# Patient Record
Sex: Female | Born: 1980
Health system: Southern US, Community
[De-identification: ages and names within clinical notes are randomized; demographics above are authoritative.]

## PROBLEM LIST (undated history)

## (undated) DIAGNOSIS — E785 Hyperlipidemia, unspecified: Secondary | ICD-10-CM

## (undated) DIAGNOSIS — K219 Gastro-esophageal reflux disease without esophagitis: Secondary | ICD-10-CM

## (undated) DIAGNOSIS — Z9889 Other specified postprocedural states: Secondary | ICD-10-CM

## (undated) DIAGNOSIS — K589 Irritable bowel syndrome without diarrhea: Secondary | ICD-10-CM

## (undated) DIAGNOSIS — M549 Dorsalgia, unspecified: Secondary | ICD-10-CM

## (undated) DIAGNOSIS — E669 Obesity, unspecified: Secondary | ICD-10-CM

## (undated) DIAGNOSIS — N946 Dysmenorrhea, unspecified: Secondary | ICD-10-CM

## (undated) DIAGNOSIS — R001 Bradycardia, unspecified: Secondary | ICD-10-CM

## (undated) DIAGNOSIS — D649 Anemia, unspecified: Secondary | ICD-10-CM

## (undated) DIAGNOSIS — E119 Type 2 diabetes mellitus without complications: Secondary | ICD-10-CM

## (undated) DIAGNOSIS — G4730 Sleep apnea, unspecified: Secondary | ICD-10-CM

## (undated) DIAGNOSIS — N912 Amenorrhea, unspecified: Secondary | ICD-10-CM

## (undated) DIAGNOSIS — O021 Missed abortion: Secondary | ICD-10-CM

## (undated) DIAGNOSIS — R079 Chest pain, unspecified: Secondary | ICD-10-CM

## (undated) DIAGNOSIS — K5900 Constipation, unspecified: Secondary | ICD-10-CM

## (undated) DIAGNOSIS — N939 Abnormal uterine and vaginal bleeding, unspecified: Secondary | ICD-10-CM

## (undated) DIAGNOSIS — R7303 Prediabetes: Secondary | ICD-10-CM

## (undated) DIAGNOSIS — R32 Unspecified urinary incontinence: Secondary | ICD-10-CM

## (undated) DIAGNOSIS — G43009 Migraine without aura, not intractable, without status migrainosus: Secondary | ICD-10-CM

## (undated) DIAGNOSIS — F419 Anxiety disorder, unspecified: Secondary | ICD-10-CM

## (undated) DIAGNOSIS — E88819 Insulin resistance, unspecified: Secondary | ICD-10-CM

## (undated) DIAGNOSIS — K829 Disease of gallbladder, unspecified: Secondary | ICD-10-CM

## (undated) DIAGNOSIS — E282 Polycystic ovarian syndrome: Secondary | ICD-10-CM

## (undated) DIAGNOSIS — E8881 Metabolic syndrome: Secondary | ICD-10-CM

## (undated) DIAGNOSIS — R112 Nausea with vomiting, unspecified: Secondary | ICD-10-CM

## (undated) DIAGNOSIS — I10 Essential (primary) hypertension: Secondary | ICD-10-CM

## (undated) DIAGNOSIS — N809 Endometriosis, unspecified: Secondary | ICD-10-CM

## (undated) DIAGNOSIS — N979 Female infertility, unspecified: Secondary | ICD-10-CM

## (undated) DIAGNOSIS — E559 Vitamin D deficiency, unspecified: Secondary | ICD-10-CM

## (undated) HISTORY — DX: Essential (primary) hypertension: I10

## (undated) HISTORY — DX: Disease of gallbladder, unspecified: K82.9

## (undated) HISTORY — DX: Chest pain, unspecified: R07.9

## (undated) HISTORY — PX: MOUTH SURGERY: SHX715

## (undated) HISTORY — DX: Endometriosis, unspecified: N80.9

## (undated) HISTORY — DX: Amenorrhea, unspecified: N91.2

## (undated) HISTORY — DX: Vitamin D deficiency, unspecified: E55.9

## (undated) HISTORY — DX: Hyperlipidemia, unspecified: E78.5

## (undated) HISTORY — DX: Dorsalgia, unspecified: M54.9

## (undated) HISTORY — DX: Abnormal uterine and vaginal bleeding, unspecified: N93.9

## (undated) HISTORY — DX: Sleep apnea, unspecified: G47.30

## (undated) HISTORY — DX: Constipation, unspecified: K59.00

## (undated) HISTORY — DX: Irritable bowel syndrome, unspecified: K58.9

## (undated) HISTORY — DX: Anemia, unspecified: D64.9

## (undated) HISTORY — DX: Migraine without aura, not intractable, without status migrainosus: G43.009

## (undated) HISTORY — DX: Bradycardia, unspecified: R00.1

## (undated) HISTORY — DX: Female infertility, unspecified: N97.9

## (undated) HISTORY — DX: Type 2 diabetes mellitus without complications: E11.9

## (undated) HISTORY — PX: CHOLECYSTECTOMY: SHX55

## (undated) HISTORY — DX: Dysmenorrhea, unspecified: N94.6

## (undated) HISTORY — DX: Unspecified urinary incontinence: R32

## (undated) HISTORY — PX: DILATION AND CURETTAGE OF UTERUS: SHX78

## (undated) HISTORY — PX: ENDOMETRIAL BIOPSY: SHX622

---
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
## 2000-10-04 ENCOUNTER — Ambulatory Visit (HOSPITAL_COMMUNITY): Admission: RE | Admit: 2000-10-04 | Discharge: 2000-10-04 | Payer: Self-pay | Admitting: *Deleted

## 2001-01-14 ENCOUNTER — Encounter: Payer: Self-pay | Admitting: *Deleted

## 2001-01-14 ENCOUNTER — Inpatient Hospital Stay (HOSPITAL_COMMUNITY): Admission: AD | Admit: 2001-01-14 | Discharge: 2001-01-14 | Payer: Self-pay | Admitting: *Deleted

## 2001-01-30 ENCOUNTER — Encounter (HOSPITAL_COMMUNITY): Admission: RE | Admit: 2001-01-30 | Discharge: 2001-01-30 | Payer: Self-pay | Admitting: Obstetrics & Gynecology

## 2001-02-07 ENCOUNTER — Encounter: Payer: Self-pay | Admitting: *Deleted

## 2001-02-07 ENCOUNTER — Inpatient Hospital Stay (HOSPITAL_COMMUNITY): Admission: AD | Admit: 2001-02-07 | Discharge: 2001-02-10 | Payer: Self-pay | Admitting: Obstetrics & Gynecology

## 2001-02-11 ENCOUNTER — Encounter: Admission: RE | Admit: 2001-02-11 | Discharge: 2001-03-13 | Payer: Self-pay | Admitting: Obstetrics & Gynecology

## 2001-03-16 ENCOUNTER — Encounter: Admission: RE | Admit: 2001-03-16 | Discharge: 2001-04-15 | Payer: Self-pay | Admitting: Obstetrics & Gynecology

## 2001-05-16 ENCOUNTER — Encounter: Admission: RE | Admit: 2001-05-16 | Discharge: 2001-06-15 | Payer: Self-pay | Admitting: Obstetrics & Gynecology

## 2001-06-16 ENCOUNTER — Encounter: Admission: RE | Admit: 2001-06-16 | Discharge: 2001-07-16 | Payer: Self-pay | Admitting: Obstetrics & Gynecology

## 2001-06-24 ENCOUNTER — Encounter: Admission: RE | Admit: 2001-06-24 | Discharge: 2001-06-24 | Payer: Self-pay | Admitting: Family Medicine

## 2001-06-24 ENCOUNTER — Other Ambulatory Visit: Admission: RE | Admit: 2001-06-24 | Discharge: 2001-06-24 | Payer: Self-pay | Admitting: Family Medicine

## 2001-06-24 ENCOUNTER — Encounter (INDEPENDENT_AMBULATORY_CARE_PROVIDER_SITE_OTHER): Payer: Self-pay | Admitting: *Deleted

## 2001-08-14 ENCOUNTER — Encounter: Admission: RE | Admit: 2001-08-14 | Discharge: 2001-09-13 | Payer: Self-pay | Admitting: Obstetrics & Gynecology

## 2001-10-14 ENCOUNTER — Encounter: Admission: RE | Admit: 2001-10-14 | Discharge: 2001-11-13 | Payer: Self-pay | Admitting: Obstetrics & Gynecology

## 2001-10-14 ENCOUNTER — Encounter: Admission: RE | Admit: 2001-10-14 | Discharge: 2001-10-14 | Payer: Self-pay | Admitting: Family Medicine

## 2001-12-14 ENCOUNTER — Encounter: Admission: RE | Admit: 2001-12-14 | Discharge: 2002-01-13 | Payer: Self-pay | Admitting: Obstetrics & Gynecology

## 2002-01-14 ENCOUNTER — Encounter: Admission: RE | Admit: 2002-01-14 | Discharge: 2002-02-13 | Payer: Self-pay | Admitting: Obstetrics & Gynecology

## 2002-05-06 ENCOUNTER — Encounter: Payer: Self-pay | Admitting: Sports Medicine

## 2002-05-06 ENCOUNTER — Encounter: Admission: RE | Admit: 2002-05-06 | Discharge: 2002-05-06 | Payer: Self-pay | Admitting: Sports Medicine

## 2002-07-14 ENCOUNTER — Encounter: Admission: RE | Admit: 2002-07-14 | Discharge: 2002-07-14 | Payer: Self-pay | Admitting: Sports Medicine

## 2002-07-14 ENCOUNTER — Other Ambulatory Visit: Admission: RE | Admit: 2002-07-14 | Discharge: 2002-07-14 | Payer: Self-pay | Admitting: Family Medicine

## 2002-07-20 ENCOUNTER — Inpatient Hospital Stay (HOSPITAL_COMMUNITY): Admission: AD | Admit: 2002-07-20 | Discharge: 2002-07-20 | Payer: Self-pay | Admitting: Obstetrics and Gynecology

## 2002-07-22 ENCOUNTER — Inpatient Hospital Stay (HOSPITAL_COMMUNITY): Admission: AD | Admit: 2002-07-22 | Discharge: 2002-07-22 | Payer: Self-pay | Admitting: Obstetrics and Gynecology

## 2002-07-29 ENCOUNTER — Inpatient Hospital Stay (HOSPITAL_COMMUNITY): Admission: AD | Admit: 2002-07-29 | Discharge: 2002-07-29 | Payer: Self-pay | Admitting: Obstetrics and Gynecology

## 2002-07-30 ENCOUNTER — Encounter: Admission: RE | Admit: 2002-07-30 | Discharge: 2002-07-30 | Payer: Self-pay | Admitting: Family Medicine

## 2002-09-01 ENCOUNTER — Encounter: Admission: RE | Admit: 2002-09-01 | Discharge: 2002-09-01 | Payer: Self-pay | Admitting: Family Medicine

## 2002-09-13 ENCOUNTER — Encounter: Payer: Self-pay | Admitting: Emergency Medicine

## 2002-09-13 ENCOUNTER — Emergency Department (HOSPITAL_COMMUNITY): Admission: EM | Admit: 2002-09-13 | Discharge: 2002-09-13 | Payer: Self-pay | Admitting: Emergency Medicine

## 2002-09-22 ENCOUNTER — Encounter: Admission: RE | Admit: 2002-09-22 | Discharge: 2002-09-22 | Payer: Self-pay | Admitting: Family Medicine

## 2002-11-27 ENCOUNTER — Encounter: Payer: Self-pay | Admitting: *Deleted

## 2002-11-27 ENCOUNTER — Emergency Department (HOSPITAL_COMMUNITY): Admission: EM | Admit: 2002-11-27 | Discharge: 2002-11-27 | Payer: Self-pay

## 2003-01-21 ENCOUNTER — Encounter: Admission: RE | Admit: 2003-01-21 | Discharge: 2003-01-21 | Payer: Self-pay | Admitting: Family Medicine

## 2003-01-28 ENCOUNTER — Encounter: Admission: RE | Admit: 2003-01-28 | Discharge: 2003-01-28 | Payer: Self-pay | Admitting: Family Medicine

## 2003-05-20 ENCOUNTER — Encounter: Admission: RE | Admit: 2003-05-20 | Discharge: 2003-05-20 | Payer: Self-pay | Admitting: Family Medicine

## 2003-05-22 ENCOUNTER — Emergency Department (HOSPITAL_COMMUNITY): Admission: EM | Admit: 2003-05-22 | Discharge: 2003-05-22 | Payer: Self-pay | Admitting: Emergency Medicine

## 2003-05-27 ENCOUNTER — Encounter: Admission: RE | Admit: 2003-05-27 | Discharge: 2003-05-27 | Payer: Self-pay | Admitting: Family Medicine

## 2003-06-15 ENCOUNTER — Encounter: Admission: RE | Admit: 2003-06-15 | Discharge: 2003-06-15 | Payer: Self-pay | Admitting: Family Medicine

## 2003-07-16 ENCOUNTER — Emergency Department (HOSPITAL_COMMUNITY): Admission: EM | Admit: 2003-07-16 | Discharge: 2003-07-16 | Payer: Self-pay | Admitting: Emergency Medicine

## 2003-12-31 ENCOUNTER — Emergency Department (HOSPITAL_COMMUNITY): Admission: EM | Admit: 2003-12-31 | Discharge: 2003-12-31 | Payer: Self-pay | Admitting: Emergency Medicine

## 2004-02-08 ENCOUNTER — Encounter: Payer: Self-pay | Admitting: Family Medicine

## 2004-02-08 ENCOUNTER — Ambulatory Visit: Payer: Self-pay | Admitting: Family Medicine

## 2004-02-08 ENCOUNTER — Other Ambulatory Visit: Admission: RE | Admit: 2004-02-08 | Discharge: 2004-02-08 | Payer: Self-pay | Admitting: Family Medicine

## 2004-02-09 ENCOUNTER — Encounter (INDEPENDENT_AMBULATORY_CARE_PROVIDER_SITE_OTHER): Payer: Self-pay | Admitting: *Deleted

## 2004-02-09 LAB — CONVERTED CEMR LAB

## 2004-02-15 ENCOUNTER — Encounter: Admission: RE | Admit: 2004-02-15 | Discharge: 2004-02-15 | Payer: Self-pay | Admitting: Family Medicine

## 2004-05-11 ENCOUNTER — Ambulatory Visit: Payer: Self-pay | Admitting: Family Medicine

## 2004-10-06 ENCOUNTER — Ambulatory Visit: Payer: Self-pay | Admitting: Family Medicine

## 2004-10-24 ENCOUNTER — Emergency Department (HOSPITAL_COMMUNITY): Admission: EM | Admit: 2004-10-24 | Discharge: 2004-10-25 | Payer: Self-pay | Admitting: Emergency Medicine

## 2005-01-19 ENCOUNTER — Emergency Department (HOSPITAL_COMMUNITY): Admission: EM | Admit: 2005-01-19 | Discharge: 2005-01-19 | Payer: Self-pay | Admitting: Family Medicine

## 2005-02-16 ENCOUNTER — Emergency Department (HOSPITAL_COMMUNITY): Admission: EM | Admit: 2005-02-16 | Discharge: 2005-02-16 | Payer: Self-pay | Admitting: *Deleted

## 2005-09-20 ENCOUNTER — Other Ambulatory Visit: Admission: RE | Admit: 2005-09-20 | Discharge: 2005-09-20 | Payer: Self-pay | Admitting: Obstetrics and Gynecology

## 2005-10-12 ENCOUNTER — Emergency Department (HOSPITAL_COMMUNITY): Admission: EM | Admit: 2005-10-12 | Discharge: 2005-10-12 | Payer: Self-pay | Admitting: Family Medicine

## 2006-03-08 ENCOUNTER — Ambulatory Visit (HOSPITAL_COMMUNITY): Admission: RE | Admit: 2006-03-08 | Discharge: 2006-03-08 | Payer: Self-pay

## 2006-03-12 ENCOUNTER — Emergency Department (HOSPITAL_COMMUNITY): Admission: EM | Admit: 2006-03-12 | Discharge: 2006-03-12 | Payer: Self-pay | Admitting: Family Medicine

## 2006-05-28 ENCOUNTER — Ambulatory Visit: Payer: Self-pay | Admitting: Family Medicine

## 2006-05-28 LAB — CONVERTED CEMR LAB
ALT: 11 units/L (ref 0–35)
AST: 13 units/L (ref 0–37)
Albumin: 4.2 g/dL (ref 3.5–5.2)
Alkaline Phosphatase: 68 units/L (ref 39–117)
BUN: 15 mg/dL (ref 6–23)
CO2: 22 meq/L (ref 19–32)
Calcium: 9 mg/dL (ref 8.4–10.5)
Chloride: 106 meq/L (ref 96–112)
Creatinine, Ser: 0.8 mg/dL (ref 0.40–1.20)
Glucose, Bld: 90 mg/dL (ref 70–99)
Potassium: 4.3 meq/L (ref 3.5–5.3)
Sodium: 140 meq/L (ref 135–145)
Total Bilirubin: 0.2 mg/dL — ABNORMAL LOW (ref 0.3–1.2)
Total Protein: 7.4 g/dL (ref 6.0–8.3)

## 2006-07-04 DIAGNOSIS — E669 Obesity, unspecified: Secondary | ICD-10-CM | POA: Insufficient documentation

## 2006-07-04 DIAGNOSIS — Z87891 Personal history of nicotine dependence: Secondary | ICD-10-CM | POA: Insufficient documentation

## 2006-07-04 DIAGNOSIS — E282 Polycystic ovarian syndrome: Secondary | ICD-10-CM | POA: Insufficient documentation

## 2006-07-05 ENCOUNTER — Encounter (INDEPENDENT_AMBULATORY_CARE_PROVIDER_SITE_OTHER): Payer: Self-pay | Admitting: *Deleted

## 2006-07-19 ENCOUNTER — Encounter (INDEPENDENT_AMBULATORY_CARE_PROVIDER_SITE_OTHER): Payer: Self-pay | Admitting: Specialist

## 2006-07-19 ENCOUNTER — Ambulatory Visit (HOSPITAL_COMMUNITY): Admission: RE | Admit: 2006-07-19 | Discharge: 2006-07-19 | Payer: Self-pay | Admitting: Obstetrics and Gynecology

## 2006-09-18 ENCOUNTER — Emergency Department (HOSPITAL_COMMUNITY): Admission: EM | Admit: 2006-09-18 | Discharge: 2006-09-18 | Payer: Self-pay | Admitting: Emergency Medicine

## 2006-11-19 ENCOUNTER — Telehealth: Payer: Self-pay | Admitting: *Deleted

## 2006-11-20 ENCOUNTER — Ambulatory Visit: Payer: Self-pay | Admitting: Family Medicine

## 2006-11-20 DIAGNOSIS — M545 Low back pain, unspecified: Secondary | ICD-10-CM | POA: Insufficient documentation

## 2006-12-05 ENCOUNTER — Ambulatory Visit: Payer: Self-pay | Admitting: Family Medicine

## 2006-12-17 ENCOUNTER — Ambulatory Visit: Payer: Self-pay | Admitting: Family Medicine

## 2007-01-10 ENCOUNTER — Telehealth (INDEPENDENT_AMBULATORY_CARE_PROVIDER_SITE_OTHER): Payer: Self-pay | Admitting: *Deleted

## 2007-01-14 ENCOUNTER — Encounter: Payer: Self-pay | Admitting: *Deleted

## 2007-09-03 ENCOUNTER — Encounter: Admission: RE | Admit: 2007-09-03 | Discharge: 2007-09-03 | Payer: Self-pay | Admitting: Family Medicine

## 2007-09-05 ENCOUNTER — Ambulatory Visit (HOSPITAL_COMMUNITY): Admission: RE | Admit: 2007-09-05 | Discharge: 2007-09-05 | Payer: Self-pay | Admitting: Unknown Physician Specialty

## 2008-06-29 ENCOUNTER — Encounter: Admission: RE | Admit: 2008-06-29 | Discharge: 2008-06-29 | Payer: Self-pay | Admitting: Anesthesiology

## 2008-07-19 ENCOUNTER — Ambulatory Visit: Payer: Self-pay | Admitting: Family Medicine

## 2008-07-19 ENCOUNTER — Encounter: Payer: Self-pay | Admitting: Family Medicine

## 2008-09-02 ENCOUNTER — Ambulatory Visit (HOSPITAL_COMMUNITY): Admission: RE | Admit: 2008-09-02 | Discharge: 2008-09-03 | Payer: Self-pay | Admitting: General Surgery

## 2008-09-02 ENCOUNTER — Encounter (INDEPENDENT_AMBULATORY_CARE_PROVIDER_SITE_OTHER): Payer: Self-pay | Admitting: General Surgery

## 2008-10-18 ENCOUNTER — Encounter: Admission: RE | Admit: 2008-10-18 | Discharge: 2008-10-18 | Payer: Self-pay | Admitting: General Surgery

## 2008-11-09 ENCOUNTER — Other Ambulatory Visit: Admission: RE | Admit: 2008-11-09 | Discharge: 2008-11-09 | Payer: Self-pay | Admitting: Obstetrics and Gynecology

## 2009-02-02 ENCOUNTER — Encounter (INDEPENDENT_AMBULATORY_CARE_PROVIDER_SITE_OTHER): Payer: Self-pay | Admitting: Obstetrics and Gynecology

## 2009-02-02 ENCOUNTER — Ambulatory Visit (HOSPITAL_COMMUNITY): Admission: RE | Admit: 2009-02-02 | Discharge: 2009-02-02 | Payer: Self-pay | Admitting: Obstetrics and Gynecology

## 2009-05-07 ENCOUNTER — Emergency Department (HOSPITAL_COMMUNITY): Admission: EM | Admit: 2009-05-07 | Discharge: 2009-05-07 | Payer: Self-pay | Admitting: Family Medicine

## 2009-10-20 ENCOUNTER — Emergency Department (HOSPITAL_COMMUNITY): Admission: EM | Admit: 2009-10-20 | Discharge: 2009-10-20 | Payer: Self-pay | Admitting: Family Medicine

## 2010-01-13 ENCOUNTER — Emergency Department (HOSPITAL_COMMUNITY): Admission: EM | Admit: 2010-01-13 | Discharge: 2010-01-13 | Payer: Self-pay | Admitting: Family Medicine

## 2010-07-23 LAB — POCT RAPID STREP A (OFFICE): Streptococcus, Group A Screen (Direct): POSITIVE — AB

## 2010-08-11 LAB — BASIC METABOLIC PANEL
Chloride: 110 mEq/L (ref 96–112)
GFR calc Af Amer: >60 mL/min (ref >60)
GFR calc non Af Amer: >60 mL/min (ref >60)
Potassium: 4.4 mEq/L (ref 3.5–5.1)
Sodium: 140 mEq/L (ref 135–145)

## 2010-08-11 LAB — CBC
HCT: 28.7 % — ABNORMAL LOW (ref 36.0–46.0)
Platelets: 358 10*3/uL (ref 150–400)
WBC: 7 10*3/uL (ref 4.0–10.5)

## 2010-08-11 LAB — URINALYSIS, ROUTINE W REFLEX MICROSCOPIC
Bilirubin Urine: NEGATIVE
Ketones, ur: NEGATIVE mg/dL
Nitrite: NEGATIVE
Specific Gravity, Urine: 1.025 (ref 1.005–1.030)
pH: 6 (ref 5.0–8.0)

## 2010-08-11 LAB — URINE MICROSCOPIC-ADD ON

## 2010-08-11 LAB — TYPE AND SCREEN

## 2010-08-11 LAB — PREGNANCY, URINE: Preg Test, Ur: NEGATIVE

## 2010-08-11 LAB — ABO/RH: ABO/RH(D): A POS

## 2010-08-16 LAB — COMPREHENSIVE METABOLIC PANEL
ALT: 19 U/L (ref 0–35)
Alkaline Phosphatase: 66 U/L (ref 39–117)
BUN: 9 mg/dL (ref 6–23)
Chloride: 104 mEq/L (ref 96–112)
Glucose, Bld: 111 mg/dL — ABNORMAL HIGH (ref 70–99)
Potassium: 4 mEq/L (ref 3.5–5.1)
Sodium: 139 mEq/L (ref 135–145)
Total Bilirubin: 0.5 mg/dL (ref 0.3–1.2)
Total Protein: 7 g/dL (ref 6.0–8.3)

## 2010-08-16 LAB — DIFFERENTIAL
Basophils Absolute: 0.1 10*3/uL (ref 0.0–0.1)
Basophils Relative: 1 % (ref 0–1)
Eosinophils Absolute: 0.1 10*3/uL (ref 0.0–0.7)
Monocytes Absolute: 0.6 10*3/uL (ref 0.1–1.0)
Monocytes Relative: 6 % (ref 3–12)
Neutrophils Relative %: 72 % (ref 43–77)

## 2010-08-16 LAB — GLUCOSE, CAPILLARY
Glucose-Capillary: 104 mg/dL — ABNORMAL HIGH (ref 70–99)
Glucose-Capillary: 115 mg/dL — ABNORMAL HIGH (ref 70–99)
Glucose-Capillary: 137 mg/dL — ABNORMAL HIGH (ref 70–99)

## 2010-08-16 LAB — CBC
HCT: 35.3 % — ABNORMAL LOW (ref 36.0–46.0)
Hemoglobin: 11.7 g/dL — ABNORMAL LOW (ref 12.0–15.0)
RBC: 4.45 MIL/uL (ref 3.87–5.11)
RDW: 14.7 % (ref 11.5–15.5)
WBC: 10.9 10*3/uL — ABNORMAL HIGH (ref 4.0–10.5)

## 2010-08-16 LAB — HEMOGLOBIN A1C: Hgb A1c MFr Bld: 5.8 % (ref 4.6–6.1)

## 2010-09-15 ENCOUNTER — Ambulatory Visit: Payer: Self-pay

## 2010-09-15 ENCOUNTER — Other Ambulatory Visit: Payer: Self-pay | Admitting: Occupational Medicine

## 2010-09-15 DIAGNOSIS — R52 Pain, unspecified: Secondary | ICD-10-CM

## 2010-09-19 NOTE — Op Note (Signed)
[name] [name], CLUTE NO.:  [id number]   MEDICAL RECORD NO.:  [id number]          PATIENT TYPE:  AMB   LOCATION:  DAY                          FACILITY:  [facility]   PHYSICIAN:  Adolph Pollack, M.D.DATE OF BIRTH:  06/23/1980   DATE OF PROCEDURE:  09/02/2008  DATE OF DISCHARGE:                               OPERATIVE REPORT   PREOPERATIVE DIAGNOSIS:  Biliary dyskinesia.   POSTOPERATIVE DIAGNOSIS:  Biliary dyskinesia.   PROCEDURE:  Laparoscopic cholecystectomy with intraoperative  cholangiogram.   SURGEON:  Adolph Pollack, M.D.   ASSISTANT:  Anselm Pancoast. Zachery Dakins, M.D.   ANESTHESIA:  General.   INDICATION:  [name] [name] is a 30 year old female with typical bulk  biliary colic but no gallstones.  However, she has a significantly  decreased gallbladder ejection fraction consistent with biliary  dyskinesia.  Her symptoms are increasing, and now she presents for  cholecystectomy.  We have discussed the procedure and risks  preoperatively.   TECHNIQUE:  She is seen in the holding area and brought to the operating  room, placed supine on the operating table, and a general anesthetic was  administered.  The abdominal wall was sterilely prepped and draped.  In  the right upper quadrant, a small incision was made, and using a 5-mm  OptiView trocar, gained access into the peritoneal cavity.  There was no  underlying bleeding or organ injury.  Following this, I then made a  supraumbilical incision and placed an 11-mm trocar in the abdominal  cavity through that incision.  An 11-mm trocar was placed through  separate xiphoid incision and a 5-mm trocar placed in the right upper  quadrant.  She was placed in reversed Trendelenburg position, the right  side tilted up.  The fundus of the gallbladder was grasped, and no acute  inflammation was noted.  It was retracted toward the right shoulder.  Using careful blunt dissection, the infundibulum was then mobilized.  I  identified the cystic duct, which had a small diameter, and created a  window around it.  I then placed a clip at the cystic duct gallbladder  junction.  Small incision was made in the cystic duct.  A  cholangiocatheter was passed through the anterior abdominal wall, placed  into the cystic duct and cholangiogram was performed.   Under real time fluoroscopy, dilute contrast was injected into the  cystic duct which was of moderate length.  The common hepatic, right and  left hepatic, and common bile ducts all filled promptly, and contrast  drained promptly into the duodenum without evidence of obstruction.  Final report is pending the radiologist's interpretation.   Following this, the cholangiocatheter was removed, the cystic duct was  clipped 3 times on the biliary side and divided.  I then identified the  anterior and posterior branch of the cystic artery.  Windows were  created around these, and they were clipped and divided.  Gallbladder  was then dissected free from liver using electrocautery.  Small puncture  was made in the gallbladder with a small amount of bile spillage which  was controlled.  Once the gallbladder  was freed from the liver, it was  placed in an Endopouch bag.  The gallbladder fossa was then copiously  irrigated and bleeding points controlled with electrocautery.  Hemostasis was adequate at this time, and there was no evidence of bile  leak.  Surgicel was placed in the gallbladder fossa.   The perihepatic area was copiously irrigated and fluid evacuated until  clear.  No bleeding was noted.  The gallbladder was removed through the  supraumbilical incision.  The remaining trocars were removed and CO2 gas  released.   Skin incisions were closed with 4-0 Monocryl subcuticular stitches  followed by Steri-Strips and sterile dressing.  She tolerated the  procedure well without apparent complications and was taken to recovery  in satisfactory condition.      Adolph Pollack, M.D.  Electronically Signed     TJR/MEDQ  D:  09/02/2008  T:  09/02/2008  Job:  [id number]

## 2010-09-22 NOTE — Op Note (Signed)
[name] [name], DOERNER              ACCOUNT NO.:  [id number]   MEDICAL RECORD NO.:  [id number]          PATIENT TYPE:  AMB   LOCATION:  SDC                           FACILITY:  [facility]   PHYSICIAN:  Dois Davenport A. Rivard, M.D. DATE OF BIRTH:  07-20-1980   DATE OF PROCEDURE:  07/19/2006  DATE OF DISCHARGE:                               OPERATIVE REPORT   PREOPERATIVE DIAGNOSIS:  Dysfunctional uterine bleeding with endometrial  polyp and polycystic ovarian syndrome.   POSTOPERATIVE DIAGNOSIS:  Dysfunctional uterine bleeding with  endometrial polyp and polycystic ovarian syndrome.   ANESTHESIA:  General.   PROCEDURES:  1. Hysteroscopy.  2. Resection of endometrial polyps.  3. D&C.  4. Paracervical block.   SURGEON:  Dr. Estanislado Pandy   ASSISTANT:  None.   ESTIMATED BLOOD LOSS:  Minimal.   PROCEDURE:  After being informed of the planned procedure with possible  complications including bleeding, infection, injury uterus, informed  consent is obtained.  The patient is taken to OR #3, given general  anesthesia with laryngeal mask without complication.  She is placed in  lithotomy position, prepped and draped in a sterile fashion, and her  bladder is emptied with an in-and-out Foley catheter.   Pelvic exam reveals an anteverted uterus, normal in size.  Adnexa are  not felt, and exam is somewhat limited by body habitus.  A weighted  speculum is inserted.  Anterior lip of the cervix is grasped with a  tenaculum forceps and paracervical block using Nesacaine 1%, 10 mL is  performed in the usual fashion.  The patient had previously used Cytotec  per vagina 400 mcg 4 hours prior to surgery which led to a nice cervical  dilatation easily allowing Hagar dilator #31.  Uterus is sounded at 9  cm.   We can then insert the operative hysteroscope easily and with the  perfusion of sorbitol 3% at a maximum pressure of 90 mmHg, we are able  to visualize the entire uterine cavity.  We note a diffusely  thickened  endometrium in the presence of 2 polyps, 1-1.5 cm in the lower anterior  left segment.  Those are resected with the single-loop resectoscope and  sent separately.  Due to the thickened endometrium, we are unable at  this time to visualize the tubal ostia.  Hysteroscope is removed, and a  sharp curette is used to curette the endometrial cavity, removing a  large amount of normal-appearing endometrial tissue.  The operative  hysteroscope is then reinserted and, again, with perfusion of sorbitol  at 90 mmHg, we are now able to visualize the entire endometrial cavity  with each tubal ostium,  and everything appears normal.  We have removed instruments.  Instruments and sponge count is complete x2.  Estimated blood loss is  minimal.  Fluid deficit is at 135 mL.  The procedure is very well  tolerated by the patient, who is taken to the recovery room in a well  and stable condition.      Crist Fat Rivard, M.D.  Electronically Signed     SAR/MEDQ  D:  07/19/2006  T:  07/19/2006  Job:  [id number]

## 2011-01-31 LAB — BASIC METABOLIC PANEL
BUN: 10
Chloride: 103
Glucose, Bld: 92
Potassium: 4.1

## 2011-01-31 LAB — CBC
HCT: 34.8 — ABNORMAL LOW
MCHC: 32.1
MCV: 72.6 — ABNORMAL LOW
Platelets: 361
RDW: 18.4 — ABNORMAL HIGH

## 2011-02-12 ENCOUNTER — Encounter (INDEPENDENT_AMBULATORY_CARE_PROVIDER_SITE_OTHER): Payer: Self-pay | Admitting: General Surgery

## 2011-02-16 ENCOUNTER — Encounter (INDEPENDENT_AMBULATORY_CARE_PROVIDER_SITE_OTHER): Payer: Self-pay | Admitting: General Surgery

## 2011-02-26 ENCOUNTER — Emergency Department (HOSPITAL_COMMUNITY)
Admission: EM | Admit: 2011-02-26 | Discharge: 2011-02-26 | Disposition: A | Discharge status: Home / Self Care | Payer: BC Managed Care – PPO | Attending: Emergency Medicine | Admitting: Emergency Medicine

## 2011-02-26 ENCOUNTER — Emergency Department (HOSPITAL_COMMUNITY): Payer: BC Managed Care – PPO

## 2011-02-26 DIAGNOSIS — R109 Unspecified abdominal pain: Secondary | ICD-10-CM | POA: Insufficient documentation

## 2011-02-26 DIAGNOSIS — E669 Obesity, unspecified: Secondary | ICD-10-CM | POA: Insufficient documentation

## 2011-02-26 DIAGNOSIS — N949 Unspecified condition associated with female genital organs and menstrual cycle: Secondary | ICD-10-CM | POA: Insufficient documentation

## 2011-02-26 DIAGNOSIS — E282 Polycystic ovarian syndrome: Secondary | ICD-10-CM | POA: Insufficient documentation

## 2011-02-26 DIAGNOSIS — E119 Type 2 diabetes mellitus without complications: Secondary | ICD-10-CM | POA: Insufficient documentation

## 2011-02-26 DIAGNOSIS — R1032 Left lower quadrant pain: Secondary | ICD-10-CM | POA: Insufficient documentation

## 2011-02-26 LAB — CBC
HCT: 38.3 % (ref 36.0–46.0)
Hemoglobin: 12.3 g/dL (ref 12.0–15.0)
MCH: 25.9 pg — ABNORMAL LOW (ref 26.0–34.0)
MCHC: 32.1 g/dL (ref 30.0–36.0)
MCV: 80.6 fL (ref 78.0–100.0)
Platelets: 243 10*3/uL (ref 150–400)
RBC: 4.75 MIL/uL (ref 3.87–5.11)
RDW: 15 % (ref 11.5–15.5)
WBC: 8.9 10*3/uL (ref 4.0–10.5)

## 2011-02-26 LAB — COMPREHENSIVE METABOLIC PANEL
ALT: 20 U/L (ref 0–35)
AST: 15 U/L (ref 0–37)
Albumin: 3.4 g/dL — ABNORMAL LOW (ref 3.5–5.2)
Alkaline Phosphatase: 73 U/L (ref 39–117)
BUN: 11 mg/dL (ref 6–23)
CO2: 27 mEq/L (ref 19–32)
Calcium: 9.5 mg/dL (ref 8.4–10.5)
Chloride: 103 mEq/L (ref 96–112)
Creatinine, Ser: 0.83 mg/dL (ref 0.50–1.10)
GFR calc Af Amer: >90 mL/min (ref >90)
GFR calc non Af Amer: >90 mL/min (ref >90)
Glucose, Bld: 117 mg/dL — ABNORMAL HIGH (ref 70–99)
Potassium: 4.1 mEq/L (ref 3.5–5.1)
Sodium: 139 mEq/L (ref 135–145)
Total Bilirubin: 0.2 mg/dL — ABNORMAL LOW (ref 0.3–1.2)
Total Protein: 7.2 g/dL (ref 6.0–8.3)

## 2011-02-26 LAB — URINALYSIS, ROUTINE W REFLEX MICROSCOPIC
Bilirubin Urine: NEGATIVE
Glucose, UA: NEGATIVE mg/dL
Hgb urine dipstick: NEGATIVE
Ketones, ur: NEGATIVE mg/dL
Nitrite: NEGATIVE
Protein, ur: NEGATIVE mg/dL
Specific Gravity, Urine: 1.02 (ref 1.005–1.030)
Urobilinogen, UA: 0.2 mg/dL (ref 0.0–1.0)
pH: 6.5 (ref 5.0–8.0)

## 2011-02-26 LAB — DIFFERENTIAL
Basophils Absolute: 0 10*3/uL (ref 0.0–0.1)
Basophils Relative: 0 % (ref 0–1)
Eosinophils Absolute: 0.1 10*3/uL (ref 0.0–0.7)
Eosinophils Relative: 1 % (ref 0–5)
Lymphocytes Relative: 23 % (ref 12–46)
Lymphs Abs: 2.1 10*3/uL (ref 0.7–4.0)
Monocytes Absolute: 0.6 10*3/uL (ref 0.1–1.0)
Monocytes Relative: 7 % (ref 3–12)
Neutro Abs: 6.1 10*3/uL (ref 1.7–7.7)
Neutrophils Relative %: 69 % (ref 43–77)

## 2011-02-26 LAB — URINE MICROSCOPIC-ADD ON

## 2011-02-26 LAB — PREGNANCY, URINE: Preg Test, Ur: NEGATIVE

## 2011-02-27 LAB — GC/CHLAMYDIA PROBE AMP, GENITAL
Chlamydia, DNA Probe: NEGATIVE
GC Probe Amp, Genital: NEGATIVE

## 2011-09-10 ENCOUNTER — Emergency Department (HOSPITAL_COMMUNITY): Payer: No Typology Code available for payment source

## 2011-09-10 ENCOUNTER — Emergency Department (HOSPITAL_COMMUNITY)
Admission: EM | Admit: 2011-09-10 | Discharge: 2011-09-10 | Disposition: A | Discharge status: Home / Self Care | Payer: No Typology Code available for payment source | Attending: Emergency Medicine | Admitting: Emergency Medicine

## 2011-09-10 ENCOUNTER — Encounter (HOSPITAL_COMMUNITY): Payer: Self-pay | Admitting: *Deleted

## 2011-09-10 DIAGNOSIS — I10 Essential (primary) hypertension: Secondary | ICD-10-CM | POA: Insufficient documentation

## 2011-09-10 DIAGNOSIS — R079 Chest pain, unspecified: Secondary | ICD-10-CM | POA: Insufficient documentation

## 2011-09-10 HISTORY — DX: Obesity, unspecified: E66.9

## 2011-09-10 HISTORY — DX: Polycystic ovarian syndrome: E28.2

## 2011-09-10 HISTORY — DX: Insulin resistance, unspecified: E88.819

## 2011-09-10 HISTORY — DX: Metabolic syndrome: E88.81

## 2011-09-10 LAB — BASIC METABOLIC PANEL
CO2: 24 mEq/L (ref 19–32)
Calcium: 9.5 mg/dL (ref 8.4–10.5)
Chloride: 104 mEq/L (ref 96–112)
Potassium: 4.3 mEq/L (ref 3.5–5.1)
Sodium: 138 mEq/L (ref 135–145)

## 2011-09-10 LAB — CBC
Platelets: 287 10*3/uL (ref 150–400)
RBC: 4.71 MIL/uL (ref 3.87–5.11)
WBC: 10.3 10*3/uL (ref 4.0–10.5)

## 2011-09-10 LAB — TROPONIN I: Troponin I: <0.3 ng/mL (ref <0.30)

## 2011-09-10 MED ORDER — ASPIRIN 325 MG PO TABS: 325.0000 mg | ORAL_TABLET | ORAL | Status: DC

## 2011-09-10 MED ORDER — ASPIRIN 81 MG PO CHEW
CHEWABLE_TABLET | ORAL | Status: AC
  Administered 2011-09-10: 243 mg
  Filled 2011-09-10: qty 3

## 2011-09-10 MED ORDER — NITROGLYCERIN 0.4 MG SL SUBL: 0.4000 mg | SUBLINGUAL_TABLET | SUBLINGUAL | Status: DC | PRN

## 2011-09-10 NOTE — ED Notes (Signed)
Pt is here for evaluation of left chest pain which began 2 hours ago and is a aching pain in her left chest and between her shoulder blades.  Pt has had nausea and dizziness with this.  No sob.  Pt states that she has had a lot of stress at home lately.

## 2011-09-10 NOTE — ED Notes (Signed)
PT ambulated with a steady gait; VSS; A&Ox3; no signs of distress; no questions at this time; respirations even and unlabored; skin warm and dry.

## 2011-09-10 NOTE — Discharge Instructions (Signed)
Chest Pain (Nonspecific) It is often hard to give a specific diagnosis for the cause of chest pain. There is always a chance that your pain could be related to something serious, such as a heart attack or a blood clot in the lungs. You need to follow up with your caregiver for further evaluation. CAUSES   Heartburn.   Pneumonia or bronchitis.   Anxiety or stress.   Inflammation around your heart (pericarditis) or lung (pleuritis or pleurisy).   A blood clot in the lung.   A collapsed lung (pneumothorax). It can develop suddenly on its own (spontaneous pneumothorax) or from injury (trauma) to the chest.   Shingles infection (herpes zoster virus).  The chest wall is composed of bones, muscles, and cartilage. Any of these can be the source of the pain.  The bones can be bruised by injury.   The muscles or cartilage can be strained by coughing or overwork.   The cartilage can be affected by inflammation and become sore (costochondritis).  DIAGNOSIS  Lab tests or other studies, such as X-rays, electrocardiography, stress testing, or cardiac imaging, may be needed to find the cause of your pain.  TREATMENT   Treatment depends on what may be causing your chest pain. Treatment may include:   Acid blockers for heartburn.   Anti-inflammatory medicine.   Pain medicine for inflammatory conditions.   Antibiotics if an infection is present.   You may be advised to change lifestyle habits. This includes stopping smoking and avoiding alcohol, caffeine, and chocolate.   You may be advised to keep your head raised (elevated) when sleeping. This reduces the chance of acid going backward from your stomach into your esophagus.   Most of the time, nonspecific chest pain will improve within 2 to 3 days with rest and mild pain medicine.  HOME CARE INSTRUCTIONS   If antibiotics were prescribed, take your antibiotics as directed. Finish them even if you start to feel better.   For the next few  days, avoid physical activities that bring on chest pain. Continue physical activities as directed.   Do not smoke.   Avoid drinking alcohol.   Only take over-the-counter or prescription medicine for pain, discomfort, or fever as directed by your caregiver.   Follow your caregiver's suggestions for further testing if your chest pain does not go away.   Keep any follow-up appointments you made. If you do not go to an appointment, you could develop lasting (chronic) problems with pain. If there is any problem keeping an appointment, you must call to reschedule.  SEEK MEDICAL CARE IF:   You think you are having problems from the medicine you are taking. Read your medicine instructions carefully.   Your chest pain does not go away, even after treatment.   You develop a rash with blisters on your chest.  SEEK IMMEDIATE MEDICAL CARE IF:   You have increased chest pain or pain that spreads to your arm, neck, jaw, back, or abdomen.   You develop shortness of breath, an increasing cough, or you are coughing up blood.   You have severe back or abdominal pain, feel nauseous, or vomit.   You develop severe weakness, fainting, or chills.   You have a fever.  THIS IS AN EMERGENCY. Do not wait to see if the pain will go away. Get medical help at once. Call your local emergency services (911 in [place] Do not drive yourself to the hospital. MAKE SURE YOU:   Understand these instructions.     Will watch your condition.   Will get help right away if you are not doing well or get worse.  Document Released: 01/31/2005 Document Revised: 04/12/2011 Document Reviewed: 11/27/2007 [employer] Patient Information 2012 [employer]. 

## 2011-09-10 NOTE — ED Provider Notes (Signed)
History     CSN: [id number]  Arrival date & time 09/10/11  1610   First MD Initiated Contact with Patient 09/10/11 1648      Chief Complaint  Patient presents with  . Chest Pain    (Consider location/radiation/quality/duration/timing/severity/associated sxs/prior treatment) Patient is a 31 y.o. female presenting with chest pain.  Chest Pain    Pt reports she has had several episodes of intermittent nausea, lightheadedness and sharp left sided chest pains since yesterday afternoon. She had an episode while at work 2 hours ago and when it became severe she decided to come to the ED for evaluation. Her symptoms have since resolved. Her pain is mostly under her left breast, described as sharp and also occasionally between her shoulder blades. She has not had any SOB or cough. No fevers, no recent travel. No PE risk factors. She has no known CAD but has been diagnosed with HTN in the past, controlled with diet and weight loss. She is a former smoker. She admits to significant work and family stress.   Past Medical History  Diagnosis Date  . Obesity   . PCOS (polycystic ovarian syndrome)   . Insulin resistance     Past Surgical History  Procedure Date  . Cholecystectomy   . Dilation and curettage of uterus     No family history on file.  History  Substance Use Topics  . Smoking status: Former Smoker    Types: Cigarettes  . Smokeless tobacco: Not on file  . Alcohol Use: No    OB History    Grav Para Term Preterm Abortions TAB SAB Ect Mult Living                  Review of Systems  Cardiovascular: Positive for chest pain.   All other systems reviewed and are negative except as noted in HPI.   Allergies  Iohexol  Home Medications   Current Outpatient Rx  Name Route Sig Dispense Refill  . ADULT MULTIVITAMIN W/MINERALS CH Oral Take 1 tablet by mouth daily.      BP 135/84  Pulse 80  Temp(Src) 98.5 F (36.9 C) (Oral)  Resp 20  SpO2 100%  LMP  08/20/2011  Physical Exam  Nursing note and vitals reviewed. Constitutional: She is oriented to person, place, and time. She appears well-developed and well-nourished.  HENT:  Head: Normocephalic and atraumatic.  Right Ear: Ear canal normal.  Left Ear: Ear canal normal.  Mouth/Throat: Oropharynx is clear and moist.  Eyes: EOM are normal. Pupils are equal, round, and reactive to light.  Neck: Normal range of motion. Neck supple.  Cardiovascular: Normal rate, normal heart sounds and intact distal pulses.   Pulmonary/Chest: Effort normal and breath sounds normal.  Abdominal: Bowel sounds are normal. She exhibits no distension. There is no tenderness.  Musculoskeletal: Normal range of motion. She exhibits no edema and no tenderness.  Neurological: She is alert and oriented to person, place, and time. She has normal strength. No cranial nerve deficit or sensory deficit.  Skin: Skin is warm and dry. No rash noted.  Psychiatric: She has a normal mood and affect.    ED Course  Procedures (including critical care time)  Labs Reviewed  BASIC METABOLIC PANEL - Abnormal; Notable for the following:    GFR calc non Af Amer 90 (*)    All other components within normal limits  CBC  TROPONIN I   Dg Chest 2 View  09/10/2011  *RADIOLOGY REPORT*  Clinical Data:  Intermittent pleuritic chest pain.  Sore throat. Cough.  CHEST - 2 VIEW  Comparison: 01/13/2010  Findings: Cardiac and mediastinal contours appear normal.  The lungs appear clear.  No pleural effusion is identified.  IMPRESSION:  No significant abnormality identified.  Original Report Authenticated By: Dellia Cloud, M.D.     No diagnosis found.    MDM   Date: 09/10/2011  Rate: 87  Rhythm: normal sinus rhythm  QRS Axis: normal  Intervals: normal  ST/T Wave abnormalities: normal  Conduction Disutrbances:none  Narrative Interpretation:   Old EKG Reviewed: none available   7:10 PM Pt feeling much better resting in the room,  no further symptoms. States better with relaxing in the room. Doubt her symptoms are cardiac in etiology, will check a second Trop and anticipate discharge if negative. Pt is amenable to this plan.        Deldrick Linch B. Bernette Mayers, MD 09/10/11 1958

## 2011-09-10 NOTE — ED Notes (Signed)
Pt ambulated to bathroom w/stand by assistance only, reports "a little woozy" upon standing which subsided, pt denies chest pain or sob

## 2011-09-10 NOTE — ED Notes (Signed)
Pt c/o of intermittent chest pain that started last night.  Pain radiates to left shoulder blade and back of neck.  Pain worsened today around noon and pt started feeling nauseous, sweaty, and dizziness.  Pain is relieved with rest and worsens with movement. Pt took a baby aspirin at 3pm today.   Pt stated she has been under a lot of stress.

## 2011-09-11 LAB — POCT I-STAT TROPONIN I: Troponin i, poc: 0.01 ng/mL (ref 0.00–0.08)

## 2011-09-13 ENCOUNTER — Other Ambulatory Visit (HOSPITAL_COMMUNITY): Payer: Self-pay | Admitting: Otolaryngology

## 2011-09-13 ENCOUNTER — Ambulatory Visit (HOSPITAL_COMMUNITY)
Admission: RE | Admit: 2011-09-13 | Discharge: 2011-09-13 | Disposition: A | Discharge status: Home / Self Care | Payer: No Typology Code available for payment source | Source: Ambulatory Visit | Attending: Otolaryngology | Admitting: Otolaryngology

## 2011-09-13 DIAGNOSIS — S02600A Fracture of unspecified part of body of mandible, initial encounter for closed fracture: Secondary | ICD-10-CM

## 2012-07-18 ENCOUNTER — Encounter (HOSPITAL_COMMUNITY): Payer: Self-pay | Admitting: Emergency Medicine

## 2012-07-18 ENCOUNTER — Emergency Department (HOSPITAL_COMMUNITY)
Admission: EM | Admit: 2012-07-18 | Discharge: 2012-07-19 | Disposition: A | Discharge status: Home / Self Care | Payer: 59 | Attending: Emergency Medicine | Admitting: Emergency Medicine

## 2012-07-18 DIAGNOSIS — R109 Unspecified abdominal pain: Secondary | ICD-10-CM | POA: Insufficient documentation

## 2012-07-18 DIAGNOSIS — R197 Diarrhea, unspecified: Secondary | ICD-10-CM | POA: Insufficient documentation

## 2012-07-18 DIAGNOSIS — Z3202 Encounter for pregnancy test, result negative: Secondary | ICD-10-CM | POA: Insufficient documentation

## 2012-07-18 DIAGNOSIS — Z87891 Personal history of nicotine dependence: Secondary | ICD-10-CM | POA: Insufficient documentation

## 2012-07-18 DIAGNOSIS — E669 Obesity, unspecified: Secondary | ICD-10-CM | POA: Insufficient documentation

## 2012-07-18 DIAGNOSIS — N390 Urinary tract infection, site not specified: Secondary | ICD-10-CM

## 2012-07-18 DIAGNOSIS — Z8742 Personal history of other diseases of the female genital tract: Secondary | ICD-10-CM | POA: Insufficient documentation

## 2012-07-18 DIAGNOSIS — K529 Noninfective gastroenteritis and colitis, unspecified: Secondary | ICD-10-CM

## 2012-07-18 DIAGNOSIS — K5289 Other specified noninfective gastroenteritis and colitis: Secondary | ICD-10-CM | POA: Insufficient documentation

## 2012-07-18 LAB — COMPREHENSIVE METABOLIC PANEL
ALT: 26 U/L (ref 0–35)
AST: 20 U/L (ref 0–37)
Albumin: 3.8 g/dL (ref 3.5–5.2)
Alkaline Phosphatase: 80 U/L (ref 39–117)
BUN: 12 mg/dL (ref 6–23)
Chloride: 101 mEq/L (ref 96–112)
Potassium: 3.5 mEq/L (ref 3.5–5.1)
Sodium: 138 mEq/L (ref 135–145)
Total Bilirubin: 0.4 mg/dL (ref 0.3–1.2)

## 2012-07-18 LAB — URINALYSIS, MICROSCOPIC ONLY
Glucose, UA: NEGATIVE mg/dL
Hgb urine dipstick: NEGATIVE
Ketones, ur: 15 mg/dL — AB
Specific Gravity, Urine: 1.03 (ref 1.005–1.030)
pH: 6 (ref 5.0–8.0)

## 2012-07-18 LAB — CBC WITH DIFFERENTIAL/PLATELET
Basophils Relative: 0 % (ref 0–1)
Hemoglobin: 13.2 g/dL (ref 12.0–15.0)
MCHC: 33.7 g/dL (ref 30.0–36.0)
Monocytes Relative: 8 % (ref 3–12)
Neutro Abs: 2.9 10*3/uL (ref 1.7–7.7)
Neutrophils Relative %: 59 % (ref 43–77)
Platelets: 242 10*3/uL (ref 150–400)
RBC: 4.9 MIL/uL (ref 3.87–5.11)

## 2012-07-18 LAB — LIPASE, BLOOD: Lipase: 15 U/L (ref 11–59)

## 2012-07-18 LAB — POCT PREGNANCY, URINE: Preg Test, Ur: NEGATIVE

## 2012-07-18 MED ORDER — SODIUM CHLORIDE 0.9 % IV BOLUS (SEPSIS)
1000.0000 mL | Freq: Once | INTRAVENOUS | Status: AC
  Administered 2012-07-19: 1000 mL via INTRAVENOUS

## 2012-07-18 MED ORDER — ONDANSETRON 4 MG PO TBDP
8.0000 mg | ORAL_TABLET | Freq: Once | ORAL | Status: AC
  Administered 2012-07-18: 8 mg via ORAL
  Filled 2012-07-18: qty 2

## 2012-07-18 NOTE — ED Provider Notes (Signed)
History     CSN: [id number]  Arrival date & time 07/18/12  1904   First MD Initiated Contact with Patient 07/18/12 2321      Chief Complaint  Patient presents with  . Nausea, Vomiting and Diarrhea     (Consider location/radiation/quality/duration/timing/severity/associated sxs/prior treatment) HPI Is a 32 year old female with nausea, vomiting and diarrhea that began yesterday. The symptoms are moderate to severe. The vomiting is exacerbated by eating or drinking. She had epigastric pain yesterday. This subsided and for the last several hours has had left lower quadrant pain. She describes the pain as 3/10 at rest but worse with palpation or movement. She has had a low-grade fever. She states her mouth is dry and she feels weak. She denies dysuria. She has had some blood streaking in her stools but attributes this to hemorrhoids. Nausea was improved after being given sublingual Zofran in triage.  Past Medical History  Diagnosis Date  . Obesity   . PCOS (polycystic ovarian syndrome)   . Insulin resistance     Past Surgical History  Procedure Laterality Date  . Cholecystectomy    . Dilation and curettage of uterus      No family history on file.  History  Substance Use Topics  . Smoking status: Former Smoker    Types: Cigarettes  . Smokeless tobacco: Not on file  . Alcohol Use: No    OB History   Grav Para Term Preterm Abortions TAB SAB Ect Mult Living                  Review of Systems  All other systems reviewed and are negative.    Allergies  Iohexol  Home Medications   Current Outpatient Rx  Name  Route  Sig  Dispense  Refill  . Multiple Vitamin (MULITIVITAMIN WITH MINERALS) TABS   Oral   Take 1 tablet by mouth daily.           BP 115/64  Pulse 84  Temp(Src) 98.1 F (36.7 C) (Oral)  Resp 23  SpO2 98%  LMP 06/11/2012  Physical Exam General: Well-developed, well-nourished female in no acute distress; appearance consistent with age of  record HENT: normocephalic, atraumatic; dry mucous membranes Eyes: pupils equal round and reactive to light; extraocular muscles intact Neck: supple Heart: regular rate and rhythm Lungs: clear to auscultation bilaterally Abdomen: soft; obese; left lower quadrant tenderness; bowel sounds decrease Extremities: No deformity; full range of motion; pulses normal; no edema Neurologic: Awake, alert and oriented; motor function intact in all extremities and symmetric; no facial droop Skin: Warm and dry Psychiatric: Normal mood and affect    ED Course  Procedures (including critical care time)     MDM   Nursing notes and vitals signs, including pulse oximetry, reviewed.  Summary of this visit's results, reviewed by myself:  Labs:  Results for orders placed during the hospital encounter of 07/18/12 (from the past 24 hour(s))  CBC WITH DIFFERENTIAL     Status: None   Collection Time    07/18/12 10:42 PM      Result Value Range   WBC 4.8  4.0 - 10.5 K/uL   RBC 4.90  3.87 - 5.11 MIL/uL   Hemoglobin 13.2  12.0 - 15.0 g/dL   HCT 82.9  56.2 - 13.0 %   MCV 80.0  78.0 - 100.0 fL   MCH 26.9  26.0 - 34.0 pg   MCHC 33.7  30.0 - 36.0 g/dL   RDW 86.5  78.4 -  15.5 %   Platelets 242  150 - 400 K/uL   Neutrophils Relative 59  43 - 77 %   Neutro Abs 2.9  1.7 - 7.7 K/uL   Lymphocytes Relative 32  12 - 46 %   Lymphs Abs 1.5  0.7 - 4.0 K/uL   Monocytes Relative 8  3 - 12 %   Monocytes Absolute 0.4  0.1 - 1.0 K/uL   Eosinophils Relative 0  0 - 5 %   Eosinophils Absolute 0.0  0.0 - 0.7 K/uL   Basophils Relative 0  0 - 1 %   Basophils Absolute 0.0  0.0 - 0.1 K/uL  COMPREHENSIVE METABOLIC PANEL     Status: Abnormal   Collection Time    07/18/12 10:42 PM      Result Value Range   Sodium 138  135 - 145 mEq/L   Potassium 3.5  3.5 - 5.1 mEq/L   Chloride 101  96 - 112 mEq/L   CO2 26  19 - 32 mEq/L   Glucose, Bld 103 (*) 70 - 99 mg/dL   BUN 12  6 - 23 mg/dL   Creatinine, Ser 6.21  0.50 - 1.10  mg/dL   Calcium 9.3  8.4 - 30.8 mg/dL   Total Protein 8.0  6.0 - 8.3 g/dL   Albumin 3.8  3.5 - 5.2 g/dL   AST 20  0 - 37 U/L   ALT 26  0 - 35 U/L   Alkaline Phosphatase 80  39 - 117 U/L   Total Bilirubin 0.4  0.3 - 1.2 mg/dL   GFR calc non Af Amer 77 (*) >90 mL/min   GFR calc Af Amer 89 (*) >90 mL/min  LIPASE, BLOOD     Status: None   Collection Time    07/18/12 10:42 PM      Result Value Range   Lipase 15  11 - 59 U/L  URINALYSIS, MICROSCOPIC ONLY     Status: Abnormal   Collection Time    07/18/12 11:07 PM      Result Value Range   Color, Urine AMBER (*) YELLOW   APPearance CLOUDY (*) CLEAR   Specific Gravity, Urine 1.030  1.005 - 1.030   pH 6.0  5.0 - 8.0   Glucose, UA NEGATIVE  NEGATIVE mg/dL   Hgb urine dipstick NEGATIVE  NEGATIVE   Bilirubin Urine SMALL (*) NEGATIVE   Ketones, ur 15 (*) NEGATIVE mg/dL   Protein, ur NEGATIVE  NEGATIVE mg/dL   Urobilinogen, UA 1.0  0.0 - 1.0 mg/dL   Nitrite NEGATIVE  NEGATIVE   Leukocytes, UA SMALL (*) NEGATIVE   WBC, UA 7-10  <3 WBC/hpf   RBC / HPF 3-6  <3 RBC/hpf   Bacteria, UA FEW (*) RARE   Squamous Epithelial / LPF FEW (*) RARE   Urine-Other MUCOUS PRESENT    POCT PREGNANCY, URINE     Status: None   Collection Time    07/18/12 11:15 PM      Result Value Range   Preg Test, Ur NEGATIVE  NEGATIVE   1:00 AM Feels much better after IV fluid bolus. Tolerating oral fluids. We will treat for urinary tract infection based on urinalysis.         Hanley Seamen, MD 07/19/12 [phone]

## 2012-07-18 NOTE — ED Notes (Signed)
Attempted to draw labs. unsuccessful 

## 2012-07-18 NOTE — ED Notes (Signed)
C/o nausea, vomiting, diarrhea, and upper abd pain since yesterday.  Denies urinary complaints.

## 2012-07-19 MED ORDER — NITROFURANTOIN MONOHYD MACRO 100 MG PO CAPS: 100.0000 mg | ORAL_CAPSULE | Freq: Two times a day (BID) | ORAL | Status: DC

## 2012-07-19 MED ORDER — DIPHENOXYLATE-ATROPINE 2.5-0.025 MG PO TABS: 1.0000 | ORAL_TABLET | Freq: Four times a day (QID) | ORAL | Status: DC | PRN

## 2012-07-19 MED ORDER — NITROFURANTOIN MONOHYD MACRO 100 MG PO CAPS
100.0000 mg | ORAL_CAPSULE | Freq: Once | ORAL | Status: AC
  Administered 2012-07-19: 100 mg via ORAL
  Filled 2012-07-19 (×2): qty 1

## 2012-07-19 MED ORDER — ONDANSETRON 8 MG PO TBDP: 8.0000 mg | ORAL_TABLET | Freq: Three times a day (TID) | ORAL | Status: DC | PRN

## 2012-07-19 NOTE — ED Notes (Signed)
Waiting on medication from the pharmacy before discharging patient. 

## 2012-07-20 LAB — URINE CULTURE

## 2012-10-15 ENCOUNTER — Encounter (HOSPITAL_COMMUNITY): Payer: Self-pay | Admitting: Pharmacy Technician

## 2012-10-15 ENCOUNTER — Other Ambulatory Visit: Payer: Self-pay | Admitting: Obstetrics

## 2012-10-17 ENCOUNTER — Encounter (HOSPITAL_COMMUNITY): Payer: Self-pay | Admitting: *Deleted

## 2012-10-17 ENCOUNTER — Ambulatory Visit (HOSPITAL_COMMUNITY): Payer: PRIVATE HEALTH INSURANCE

## 2012-10-17 ENCOUNTER — Ambulatory Visit (HOSPITAL_COMMUNITY)
Admission: RE | Admit: 2012-10-17 | Discharge: 2012-10-17 | Disposition: A | Discharge status: Home / Self Care | Payer: PRIVATE HEALTH INSURANCE | Source: Ambulatory Visit | Attending: Obstetrics | Admitting: Obstetrics

## 2012-10-17 ENCOUNTER — Encounter (HOSPITAL_COMMUNITY)
Admission: RE | Disposition: A | Discharge status: Home / Self Care | Payer: Self-pay | Source: Ambulatory Visit | Attending: Obstetrics

## 2012-10-17 ENCOUNTER — Encounter (HOSPITAL_COMMUNITY): Payer: Self-pay

## 2012-10-17 DIAGNOSIS — N920 Excessive and frequent menstruation with regular cycle: Secondary | ICD-10-CM | POA: Insufficient documentation

## 2012-10-17 DIAGNOSIS — E282 Polycystic ovarian syndrome: Secondary | ICD-10-CM | POA: Insufficient documentation

## 2012-10-17 DIAGNOSIS — Z30430 Encounter for insertion of intrauterine contraceptive device: Secondary | ICD-10-CM | POA: Insufficient documentation

## 2012-10-17 HISTORY — PX: DILATATION & CURRETTAGE/HYSTEROSCOPY WITH RESECTOCOPE: SHX5572

## 2012-10-17 HISTORY — PX: INTRAUTERINE DEVICE (IUD) INSERTION: SHX5877

## 2012-10-17 LAB — CBC
HCT: 35.3 % — ABNORMAL LOW (ref 36.0–46.0)
Hemoglobin: 11.4 g/dL — ABNORMAL LOW (ref 12.0–15.0)
MCHC: 32.3 g/dL (ref 30.0–36.0)
RBC: 4.36 MIL/uL (ref 3.87–5.11)
WBC: 7.7 10*3/uL (ref 4.0–10.5)

## 2012-10-17 LAB — PREGNANCY, URINE: Preg Test, Ur: NEGATIVE

## 2012-10-17 LAB — TYPE AND SCREEN
ABO/RH(D): A POS
Antibody Screen: NEGATIVE

## 2012-10-17 SURGERY — DILATATION & CURETTAGE/HYSTEROSCOPY WITH RESECTOCOPE
Anesthesia: General

## 2012-10-17 MED ORDER — FENTANYL CITRATE 0.05 MG/ML IJ SOLN
INTRAMUSCULAR | Status: AC
  Filled 2012-10-17: qty 2

## 2012-10-17 MED ORDER — FENTANYL CITRATE 0.05 MG/ML IJ SOLN: 25.0000 ug | INTRAMUSCULAR | Status: DC | PRN

## 2012-10-17 MED ORDER — SILVER NITRATE-POT NITRATE 75-25 % EX MISC
CUTANEOUS | Status: AC
  Filled 2012-10-17: qty 1

## 2012-10-17 MED ORDER — ROCURONIUM BROMIDE 50 MG/5ML IV SOLN
INTRAVENOUS | Status: AC
  Filled 2012-10-17: qty 1

## 2012-10-17 MED ORDER — PROMETHAZINE HCL 25 MG/ML IJ SOLN: 6.2500 mg | INTRAMUSCULAR | Status: DC | PRN

## 2012-10-17 MED ORDER — LIDOCAINE HCL (CARDIAC) 20 MG/ML IV SOLN
INTRAVENOUS | Status: AC
  Filled 2012-10-17: qty 5

## 2012-10-17 MED ORDER — BUPIVACAINE HCL (PF) 0.5 % IJ SOLN
INTRAMUSCULAR | Status: AC
  Filled 2012-10-17: qty 30

## 2012-10-17 MED ORDER — VASOPRESSIN 20 UNIT/ML IJ SOLN
INTRAMUSCULAR | Status: AC
  Filled 2012-10-17: qty 1

## 2012-10-17 MED ORDER — DOXYCYCLINE HYCLATE 100 MG IV SOLR
100.0000 mg | Freq: Two times a day (BID) | INTRAVENOUS | Status: DC
  Filled 2012-10-17 (×4): qty 100

## 2012-10-17 MED ORDER — ONDANSETRON HCL 4 MG/2ML IJ SOLN
INTRAMUSCULAR | Status: DC | PRN
  Administered 2012-10-17 (×2): 4 mg via INTRAVENOUS

## 2012-10-17 MED ORDER — GLYCOPYRROLATE 0.2 MG/ML IJ SOLN
INTRAMUSCULAR | Status: DC | PRN
  Administered 2012-10-17: 0.2 mg via INTRAVENOUS

## 2012-10-17 MED ORDER — OXYCODONE-ACETAMINOPHEN 5-325 MG PO TABS
ORAL_TABLET | ORAL | Status: AC
  Filled 2012-10-17: qty 1

## 2012-10-17 MED ORDER — PROPOFOL 10 MG/ML IV EMUL
INTRAVENOUS | Status: AC
  Filled 2012-10-17: qty 20

## 2012-10-17 MED ORDER — DEXAMETHASONE SODIUM PHOSPHATE 10 MG/ML IJ SOLN
INTRAMUSCULAR | Status: AC
  Filled 2012-10-17: qty 1

## 2012-10-17 MED ORDER — MEPERIDINE HCL 25 MG/ML IJ SOLN
INTRAMUSCULAR | Status: AC
  Administered 2012-10-17: 6.25 mg via INTRAVENOUS
  Filled 2012-10-17: qty 1

## 2012-10-17 MED ORDER — DOXYCYCLINE HYCLATE 100 MG IV SOLR
100.0000 mg | Freq: Once | INTRAVENOUS | Status: AC
  Administered 2012-10-17: 100 mg via INTRAVENOUS
  Filled 2012-10-17: qty 100

## 2012-10-17 MED ORDER — SCOPOLAMINE 1 MG/3DAYS TD PT72
MEDICATED_PATCH | TRANSDERMAL | Status: AC
  Administered 2012-10-17: 1.5 mg
  Filled 2012-10-17: qty 1

## 2012-10-17 MED ORDER — MIDAZOLAM HCL 5 MG/5ML IJ SOLN
INTRAMUSCULAR | Status: DC | PRN
  Administered 2012-10-17: 2 mg via INTRAVENOUS

## 2012-10-17 MED ORDER — LACTATED RINGERS IV SOLN
Freq: Once | INTRAVENOUS | Status: AC
  Administered 2012-10-17 (×2): via INTRAVENOUS

## 2012-10-17 MED ORDER — OXYCODONE-ACETAMINOPHEN 5-325 MG PO TABS: 2.0000 | ORAL_TABLET | ORAL | Status: DC | PRN

## 2012-10-17 MED ORDER — CHLOROPROCAINE HCL 1 % IJ SOLN
INTRAMUSCULAR | Status: AC
  Filled 2012-10-17: qty 30

## 2012-10-17 MED ORDER — KETOROLAC TROMETHAMINE 30 MG/ML IJ SOLN
INTRAMUSCULAR | Status: DC | PRN
  Administered 2012-10-17: 30 mg via INTRAVENOUS

## 2012-10-17 MED ORDER — SCOPOLAMINE 1 MG/3DAYS TD PT72: 1.0000 | MEDICATED_PATCH | TRANSDERMAL | Status: DC

## 2012-10-17 MED ORDER — DEXAMETHASONE SODIUM PHOSPHATE 4 MG/ML IJ SOLN
INTRAMUSCULAR | Status: DC | PRN
  Administered 2012-10-17: 10 mg via INTRAVENOUS

## 2012-10-17 MED ORDER — ONDANSETRON HCL 4 MG/2ML IJ SOLN
INTRAMUSCULAR | Status: AC
  Filled 2012-10-17: qty 2

## 2012-10-17 MED ORDER — KETOROLAC TROMETHAMINE 30 MG/ML IJ SOLN
INTRAMUSCULAR | Status: AC
  Filled 2012-10-17: qty 1

## 2012-10-17 MED ORDER — FENTANYL CITRATE 0.05 MG/ML IJ SOLN
INTRAMUSCULAR | Status: DC | PRN
  Administered 2012-10-17 (×5): 50 ug via INTRAVENOUS
  Administered 2012-10-17: 100 ug via INTRAVENOUS

## 2012-10-17 MED ORDER — MEPERIDINE HCL 25 MG/ML IJ SOLN: 6.2500 mg | INTRAMUSCULAR | Status: DC | PRN

## 2012-10-17 MED ORDER — FENTANYL CITRATE 0.05 MG/ML IJ SOLN
INTRAMUSCULAR | Status: AC
  Filled 2012-10-17: qty 5

## 2012-10-17 MED ORDER — SODIUM CHLORIDE 0.9 % IR SOLN
Status: DC | PRN
  Administered 2012-10-17: 1

## 2012-10-17 MED ORDER — FENTANYL CITRATE 0.05 MG/ML IJ SOLN
INTRAMUSCULAR | Status: AC
  Administered 2012-10-17: 25 ug via INTRAVENOUS
  Filled 2012-10-17: qty 2

## 2012-10-17 MED ORDER — KETOROLAC TROMETHAMINE 30 MG/ML IJ SOLN: 15.0000 mg | Freq: Once | INTRAMUSCULAR | Status: DC | PRN

## 2012-10-17 MED ORDER — MIDAZOLAM HCL 2 MG/2ML IJ SOLN
INTRAMUSCULAR | Status: AC
  Filled 2012-10-17: qty 2

## 2012-10-17 MED ORDER — OXYCODONE-ACETAMINOPHEN 5-325 MG PO TABS
1.0000 | ORAL_TABLET | ORAL | Status: DC | PRN
  Administered 2012-10-17: 1 via ORAL

## 2012-10-17 SURGICAL SUPPLY — 15 items
CANISTER SUCTION 2500CC (MISCELLANEOUS) ×2 IMPLANT
CATH ROBINSON RED A/P 16FR (CATHETERS) ×2 IMPLANT
CLOTH BEACON ORANGE TIMEOUT ST (SAFETY) ×2 IMPLANT
CONTAINER PREFILL 10% NBF 60ML (FORM) ×4 IMPLANT
DRESSING TELFA 8X3 (GAUZE/BANDAGES/DRESSINGS) ×2 IMPLANT
ELECT REM PT RETURN 9FT ADLT (ELECTROSURGICAL)
ELECTRODE REM PT RTRN 9FT ADLT (ELECTROSURGICAL) IMPLANT
GLOVE BIO SURGEON STRL SZ 6.5 (GLOVE) ×2 IMPLANT
GLOVE BIOGEL PI IND STRL 7.0 (GLOVE) ×1 IMPLANT
GLOVE BIOGEL PI INDICATOR 7.0 (GLOVE) ×1
GOWN STRL REIN XL XLG (GOWN DISPOSABLE) ×4 IMPLANT
PACK HYSTEROSCOPY LF (CUSTOM PROCEDURE TRAY) ×2 IMPLANT
PAD OB MATERNITY 4.3X12.25 (PERSONAL CARE ITEMS) ×2 IMPLANT
TOWEL OR 17X24 6PK STRL BLUE (TOWEL DISPOSABLE) ×4 IMPLANT
WATER STERILE IRR 1000ML POUR (IV SOLUTION) ×2 IMPLANT

## 2012-10-17 NOTE — H&P (Signed)
See paper H&P which will be scanned into media files  IN short, 32 yo G1P1 non-pregnant pt w/ 2 wks of menorrhagia following 6 months of oligomenorrhea. Pt w/ morbid obesity and PCOS. Pt unable to tolerate in-office endometrial biopsy and here for D&C, hysteroscopy, possible polypectomy/ myomectomy if needed and Mirena IUD placement.  No change to H&P in chart.   PE: Filed Vitals:   10/17/12 1002  BP: 128/65  Pulse: 91  Temp: 98.2 F (36.8 C)   Gen: no distress Abd: morbid obesity, NT, no masses GU: def to OR LE: NT, no edema  CBC    Component Value Date/Time   WBC 7.7 10/17/2012 1012   RBC 4.36 10/17/2012 1012   HGB 11.4* 10/17/2012 1012   HCT 35.3* 10/17/2012 1012   PLT 289 10/17/2012 1012   MCV 81.0 10/17/2012 1012   MCH 26.1 10/17/2012 1012   MCHC 32.3 10/17/2012 1012   RDW 15.2 10/17/2012 1012   LYMPHSABS 1.5 07/18/2012 2242   MONOABS 0.4 07/18/2012 2242   EOSABS 0.0 07/18/2012 2242   BASOSABS 0.0 07/18/2012 2242    A/P DUB following long period of amenorrhea, most likley anovulatory bleeding with no reponse to 20 mg Provera - D&C, hysteroscopy, IUD placement  [name] [name] A. 10/17/2012 10:58 AM

## 2012-10-17 NOTE — Transfer of Care (Signed)
Immediate Anesthesia Transfer of Care Note  Patient: [name] [name]  Procedure(s) Performed: Procedure(s) with comments: DILATATION & CURETTAGE/HYSTEROSCOPY WITH RESECTOCOPE; Possible Polypectomy, Possible Resectoscopic Myomectomy. (N/A) - 1 hr. INTRAUTERINE DEVICE (IUD) INSERTION; Mirena (N/A)  Patient Location: PACU  Anesthesia Type:General  Level of Consciousness: awake, alert  and oriented  Airway & Oxygen Therapy: Patient Spontanous Breathing and Patient connected to nasal cannula oxygen  Post-op Assessment: Report given to PACU RN and Post -op Vital signs reviewed and stable  Post vital signs: Reviewed and stable  Complications: No apparent anesthesia complications

## 2012-10-17 NOTE — Anesthesia Postprocedure Evaluation (Signed)
  Anesthesia Post Note  Patient: [name] [name]  Procedure(s) Performed: Procedure(s) (LRB): DILATATION & CURETTAGE/HYSTEROSCOPY WITH RESECTOCOPE; Possible Polypectomy, Possible Resectoscopic Myomectomy. (N/A) INTRAUTERINE DEVICE (IUD) INSERTION; Mirena (N/A)  Anesthesia type: GA  Patient location: PACU  Post pain: Pain level controlled  Post assessment: Post-op Vital signs reviewed  Last Vitals:  Filed Vitals:   10/17/12 1229  BP:   Pulse:   Temp: 36.5 C    Post vital signs: Reviewed  Level of consciousness: sedated  Complications: No apparent anesthesia complications

## 2012-10-17 NOTE — Brief Op Note (Signed)
10/17/2012  12:31 PM  PATIENT:  [name] [name]  32 y.o. female  PRE-OPERATIVE DIAGNOSIS:  Menorrhagia  POST-OPERATIVE DIAGNOSIS:  Menorrhagia, chronic anovulation  PROCEDURE: hysteroscopy, D&C, Mirena insertion  SURGEON:  Surgeon(s) and Role:    * Mac Dowdell A. Ernestina Penna, MD - Primary  PHYSICIAN ASSISTANT:   ASSISTANTS: none   ANESTHESIA:   general  EBL:  Total I/O In: 1000 [I.V.:1000] Out: -   BLOOD ADMINISTERED:none  DRAINS: none   LOCAL MEDICATIONS USED:  NONE  SPECIMEN:  Source of Specimen:  endometrial currettings  DISPOSITION OF SPECIMEN:  PATHOLOGY  COUNTS:  YES  TOURNIQUET:  * No tourniquets in log *  DICTATION: .Note written in EPIC  PLAN OF CARE: Discharge to home after PACU  PATIENT DISPOSITION:  PACU - hemodynamically stable.   Delay start of Pharmacological VTE agent (>24hrs) due to surgical blood loss or risk of bleeding: yes

## 2012-10-17 NOTE — Op Note (Signed)
10/17/2012  12:31 PM  PATIENT:  [name] [name]  32 y.o. female  PRE-OPERATIVE DIAGNOSIS:  Menorrhagia  POST-OPERATIVE DIAGNOSIS:  Menorrhagia, chronic anovulation  PROCEDURE: hysteroscopy, D&C, Mirena insertion  SURGEON:  Surgeon(s) and Role:    * Idrissa Beville A. Ernestina Penna, MD - Primary  PHYSICIAN ASSISTANT:   ASSISTANTS: none   ANESTHESIA:   general  EBL:  Total I/O In: 1000 [I.V.:1000] Out: -   BLOOD ADMINISTERED:none  DRAINS: none   LOCAL MEDICATIONS USED:  NONE  SPECIMEN:  Source of Specimen:  endometrial currettings  DISPOSITION OF SPECIMEN:  PATHOLOGY  COUNTS:  YES  TOURNIQUET:  * No tourniquets in log *  DICTATION: .Note written in EPIC  PLAN OF CARE: Discharge to home after PACU  PATIENT DISPOSITION:  PACU - hemodynamically stable.   Delay start of Pharmacological VTE agent (>24hrs) due to surgical blood loss or risk of bleeding: yes  Indications: menorrhagia following 6 months amenorrhea  Abx: 100mg  IV doxycycline Complications: none  Procedure: After informed consent was obtained the patient including risks benefits alternatives of procedure patient is a the operating room where general anesthesia was initiated without difficulty. She was prepped and draped in the normal sterile fashion in the dorsal supine lithotomy position. No catheterization was done. Speculum was inserted into the vagina however due to maternal habitus several different types of speculums and weighted retractor combinations were used until eventually the long Graves speculum with 2 Deaver retractors to retract the vaginal sidewalls were the eventual solution to obtain adequate visualization of the cervix. Single-tooth tenaculum was used to grasp the anterior lip of the cervix. The cervix deviated anteriorly. The uterus sounded to 11 cm. The small hysteroscope was inserted into the cervix but very limited visualization of the endometrial cavity was obtained. Thickened endometrial tissue along  with difficulty distending the uterus was noted. Decision was then made to proceed with D&C. Several passes were made with a sharp curet to obtain moderate amount of tissue. 2 additional looks with the hysteroscope were done. The ostia were never fully visualized due to endometrial changes after the D&C especially in the cornua. The fundal part of the uterus had much better visualization and appeared thinned. No worrisome masses were noted. No polyps and no fibroids were noted. Small bleeding was noted via the cervix but no dominant bleeding vessel was seen hysteroscopically. Overall the hysteroscopic images were limited due to the preoperative thickening, the current vaginal bleeding, the difficulty distending the uterus to the maternal habitus and the endometrial changes post sharp curettage. The decision was then made to proceed with a IUD insertion this was done without complication. Strings were trimmed about 2 inches.  Patient tolerated procedure well sponge lap and needle counts are correct x3 the patient takes recovery room in stable condition  Aryella Besecker A. 10/17/2012 12:40 PM

## 2012-10-17 NOTE — Anesthesia Preprocedure Evaluation (Signed)
Anesthesia Evaluation  Patient identified by MRN, date of birth, ID band Patient awake    Reviewed: Allergy & Precautions, H&P , NPO status , Patient's Chart, lab work & pertinent test results  Airway Mallampati: III TM Distance: >3 FB Neck ROM: full    Dental no notable dental hx. (+) Teeth Intact   Pulmonary neg pulmonary ROS,    Pulmonary exam normal       Cardiovascular negative cardio ROS      Neuro/Psych negative neurological ROS     GI/Hepatic negative GI ROS, Neg liver ROS,   Endo/Other  negative endocrine ROSMorbid obesity  Renal/GU negative Renal ROS  negative genitourinary   Musculoskeletal negative musculoskeletal ROS (+)   Abdominal Normal abdominal exam  (+)   Peds negative pediatric ROS (+)  Hematology negative hematology ROS (+)   Anesthesia Other Findings   Reproductive/Obstetrics negative OB ROS                           Anesthesia Physical Anesthesia Plan  ASA: III  Anesthesia Plan: General   Post-op Pain Management:    Induction: Intravenous  Airway Management Planned: LMA  Additional Equipment:   Intra-op Plan:   Post-operative Plan:   Informed Consent: I have reviewed the patients History and Physical, chart, labs and discussed the procedure including the risks, benefits and alternatives for the proposed anesthesia with the patient or authorized representative who has indicated his/her understanding and acceptance.     Plan Discussed with: CRNA and Surgeon  Anesthesia Plan Comments:         Anesthesia Quick Evaluation

## 2012-10-17 NOTE — Anesthesia Procedure Notes (Signed)
Procedure Name: LMA Insertion Date/Time: 10/17/2012 11:18 AM Performed by: Lincoln Brigham Pre-anesthesia Checklist: Patient identified, Timeout performed, Emergency Drugs available, Suction available and Patient being monitored Patient Re-evaluated:Patient Re-evaluated prior to inductionOxygen Delivery Method: Circle system utilized Preoxygenation: Pre-oxygenation with 100% oxygen Intubation Type: IV induction Ventilation: Mask ventilation without difficulty LMA: LMA inserted and LMA with gastric port inserted LMA Size: 4.0 Number of attempts: 1 Dental Injury: Teeth and Oropharynx as per pre-operative assessment

## 2012-10-20 ENCOUNTER — Encounter (HOSPITAL_COMMUNITY): Payer: Self-pay | Admitting: Obstetrics

## 2012-11-24 ENCOUNTER — Ambulatory Visit: Payer: Self-pay | Admitting: Dietician

## 2013-03-12 ENCOUNTER — Other Ambulatory Visit: Payer: Self-pay

## 2013-03-18 ENCOUNTER — Encounter (HOSPITAL_COMMUNITY): Payer: Self-pay | Admitting: Emergency Medicine

## 2013-03-18 ENCOUNTER — Emergency Department (INDEPENDENT_AMBULATORY_CARE_PROVIDER_SITE_OTHER)
Admission: EM | Admit: 2013-03-18 | Discharge: 2013-03-18 | Disposition: A | Discharge status: Home / Self Care | Payer: No Typology Code available for payment source | Source: Home / Self Care

## 2013-03-18 DIAGNOSIS — J45909 Unspecified asthma, uncomplicated: Secondary | ICD-10-CM

## 2013-03-18 DIAGNOSIS — R05 Cough: Secondary | ICD-10-CM

## 2013-03-18 DIAGNOSIS — R059 Cough, unspecified: Secondary | ICD-10-CM

## 2013-03-18 MED ORDER — ALBUTEROL SULFATE HFA 108 (90 BASE) MCG/ACT IN AERS: 2.0000 | INHALATION_SPRAY | RESPIRATORY_TRACT | Status: DC | PRN

## 2013-03-18 MED ORDER — TRIAMCINOLONE ACETONIDE 40 MG/ML IJ SUSP
40.0000 mg | Freq: Once | INTRAMUSCULAR | Status: AC
  Administered 2013-03-18: 40 mg via INTRAMUSCULAR

## 2013-03-18 MED ORDER — TRIAMCINOLONE ACETONIDE 40 MG/ML IJ SUSP
INTRAMUSCULAR | Status: AC
  Filled 2013-03-18: qty 1

## 2013-03-18 MED ORDER — ALBUTEROL SULFATE (5 MG/ML) 0.5% IN NEBU
5.0000 mg | INHALATION_SOLUTION | Freq: Once | RESPIRATORY_TRACT | Status: AC
  Administered 2013-03-18: 5 mg via RESPIRATORY_TRACT

## 2013-03-18 MED ORDER — PREDNISONE 20 MG PO TABS: ORAL_TABLET | ORAL | Status: DC

## 2013-03-18 MED ORDER — IPRATROPIUM BROMIDE 0.02 % IN SOLN
RESPIRATORY_TRACT | Status: AC
  Filled 2013-03-18: qty 2.5

## 2013-03-18 MED ORDER — SODIUM CHLORIDE 0.9 % IN NEBU
INHALATION_SOLUTION | RESPIRATORY_TRACT | Status: AC
  Filled 2013-03-18: qty 3

## 2013-03-18 MED ORDER — ALBUTEROL SULFATE (5 MG/ML) 0.5% IN NEBU
INHALATION_SOLUTION | RESPIRATORY_TRACT | Status: AC
  Filled 2013-03-18: qty 1

## 2013-03-18 MED ORDER — IPRATROPIUM BROMIDE 0.02 % IN SOLN
0.5000 mg | Freq: Once | RESPIRATORY_TRACT | Status: AC
  Administered 2013-03-18: 0.5 mg via RESPIRATORY_TRACT

## 2013-03-18 NOTE — ED Provider Notes (Signed)
Medical screening examination/treatment/procedure(s) were performed by non-physician practitioner and as supervising physician I was immediately available for consultation/collaboration.  Millenia Waldvogel, M.D.  Shardea Cwynar C Lastacia Solum, MD 03/18/13 1932 

## 2013-03-18 NOTE — ED Provider Notes (Signed)
CSN: [id number]     Arrival date & time 03/18/13  1651 History   First MD Initiated Contact with Patient 03/18/13 1713     Chief Complaint  Patient presents with  . Cough   (Consider location/radiation/quality/duration/timing/severity/associated sxs/prior Treatment) HPI Comments: 32 year old moderately obese female complaining of a cough for 4 weeks. It is nonproductive seemingly worse in any early morning in the evening. It is associated with a low-grade fever the second week but no fever since. Is not associated with upper respiratory congestion, nasal congestion or PND, rhinorrhea or earaches. She states that she has coughed so much that she hurts in the left upper back when coughing.   Past Medical History  Diagnosis Date  . Obesity   . PCOS (polycystic ovarian syndrome)   . Insulin resistance    Past Surgical History  Procedure Laterality Date  . Cholecystectomy    . Dilation and curettage of uterus    . Dilatation & currettage/hysteroscopy with resectocope N/A 10/17/2012    Procedure: DILATATION & CURETTAGE/HYSTEROSCOPY WITH RESECTOCOPE; Possible Polypectomy, Possible Resectoscopic Myomectomy.;  Surgeon: Alphonsus Sias. Ernestina Penna, MD;  Location: [facility];  Service: Gynecology;  Laterality: N/A;  1 hr.  . Intrauterine device (iud) insertion N/A 10/17/2012    Procedure: INTRAUTERINE DEVICE (IUD) INSERTION; Mirena;  Surgeon: Alphonsus Sias. Ernestina Penna, MD;  Location: [facility];  Service: Gynecology;  Laterality: N/A;   History reviewed. No pertinent family history. History  Substance Use Topics  . Smoking status: Former Smoker    Types: Cigarettes  . Smokeless tobacco: Not on file  . Alcohol Use: No   OB History   Grav Para Term Preterm Abortions TAB SAB Ect Mult Living                 Review of Systems  Constitutional: Positive for activity change. Negative for fever, diaphoresis and fatigue.  HENT: Negative.   Respiratory: Positive for cough and shortness of breath.   Cardiovascular:  Positive for chest pain.  Gastrointestinal: Negative.   Genitourinary: Negative.   Musculoskeletal: Positive for back pain.  Skin: Negative.   Neurological: Negative.     Allergies  Iohexol  Home Medications   Current Outpatient Rx  Name  Route  Sig  Dispense  Refill  . diazepam (VALIUM) 5 MG tablet   Oral   Take 5 mg by mouth every 8 (eight) hours as needed for anxiety.         Marland Kitchen albuterol (PROVENTIL HFA;VENTOLIN HFA) 108 (90 BASE) MCG/ACT inhaler   Inhalation   Inhale 2 puffs into the lungs every 4 (four) hours as needed for wheezing or shortness of breath.   1 Inhaler   0   . oxyCODONE-acetaminophen (ROXICET) 5-325 MG per tablet   Oral   Take 2 tablets by mouth every 4 (four) hours as needed for pain.   30 tablet   0   . predniSONE (DELTASONE) 20 MG tablet      3 tabs for 1 day then 2 tabs q d for 3 days, then 1 tab q d x 2 days.   11 tablet   0   . Prenatal Vit-Fe Fumarate-FA (PRENATAL MULTIVITAMIN) TABS   Oral   Take 1 tablet by mouth daily at 12 noon.         . promethazine (PHENERGAN) 25 MG tablet   Oral   Take 25 mg by mouth every 6 (six) hours as needed for nausea.          BP 139/78  Pulse 81  Temp(Src) 98.9 F (37.2 C) (Oral)  Resp 20  SpO2 99% Physical Exam  Nursing note and vitals reviewed. Constitutional: She is oriented to person, place, and time. She appears well-developed and well-nourished. No distress.  HENT:  Right Ear: External ear normal.  Left Ear: External ear normal.  Mouth/Throat: Oropharynx is clear and moist. No oropharyngeal exudate.  Eyes: Conjunctivae and EOM are normal.  Neck: Normal range of motion. Neck supple.  Cardiovascular: Normal rate, regular rhythm and normal heart sounds.   Pulmonary/Chest: Effort normal. No respiratory distress. She has wheezes.  Prolonged expiratory phase, diminished breath sounds with expiration. Mild coarseness with forced expiration and cough.  Lymphadenopathy:    She has no cervical  adenopathy.  Neurological: She is alert and oriented to person, place, and time.  Skin: Skin is warm and dry. She is not diaphoretic.  Psychiatric: She has a normal mood and affect.    ED Course  Procedures (including critical care time) Labs Review Labs Reviewed - No data to display Imaging Review No results found.    MDM   1. RAD (reactive airway disease) with wheezing   2. Cough      Post Duobeb at 1820h pt st is breathing better and coughing less.  Ausculation : lungs clear, no wheeze, good air movement bilat. D/C in a stable and improved condition.  Rx Albuterol HFA as dir Prednisone as dir Allegra or claritin daily.   Hayden Rasmussen, NP 03/18/13 1840

## 2013-03-18 NOTE — ED Notes (Signed)
Pt  Has  Symptoms  Of a  Cough  As  Well   As  Tightness  chest  And  Pain  When she  Takes  A  Deep breath   She  Is  Sitting  Upright on  Exam table       Speaking in  Complete  Sentances    No recent  Air  Travel         Pt has  An iud

## 2013-08-28 ENCOUNTER — Encounter (HOSPITAL_COMMUNITY): Payer: Self-pay | Admitting: Emergency Medicine

## 2013-08-28 ENCOUNTER — Emergency Department (HOSPITAL_COMMUNITY): Payer: No Typology Code available for payment source

## 2013-08-28 ENCOUNTER — Emergency Department (HOSPITAL_COMMUNITY)
Admission: EM | Admit: 2013-08-28 | Discharge: 2013-08-29 | Disposition: A | Discharge status: Home / Self Care | Payer: No Typology Code available for payment source | Attending: Emergency Medicine | Admitting: Emergency Medicine

## 2013-08-28 DIAGNOSIS — R0602 Shortness of breath: Secondary | ICD-10-CM | POA: Insufficient documentation

## 2013-08-28 DIAGNOSIS — R209 Unspecified disturbances of skin sensation: Secondary | ICD-10-CM | POA: Insufficient documentation

## 2013-08-28 DIAGNOSIS — Z87891 Personal history of nicotine dependence: Secondary | ICD-10-CM | POA: Insufficient documentation

## 2013-08-28 DIAGNOSIS — R0789 Other chest pain: Secondary | ICD-10-CM | POA: Insufficient documentation

## 2013-08-28 DIAGNOSIS — R079 Chest pain, unspecified: Secondary | ICD-10-CM

## 2013-08-28 DIAGNOSIS — E669 Obesity, unspecified: Secondary | ICD-10-CM | POA: Insufficient documentation

## 2013-08-28 DIAGNOSIS — Z3202 Encounter for pregnancy test, result negative: Secondary | ICD-10-CM | POA: Insufficient documentation

## 2013-08-28 DIAGNOSIS — Z79899 Other long term (current) drug therapy: Secondary | ICD-10-CM | POA: Insufficient documentation

## 2013-08-28 LAB — POC URINE PREG, ED: PREG TEST UR: NEGATIVE

## 2013-08-28 MED ORDER — NITROGLYCERIN 0.4 MG SL SUBL
SUBLINGUAL_TABLET | SUBLINGUAL | Status: AC
  Filled 2013-08-28: qty 1

## 2013-08-28 MED ORDER — NITROGLYCERIN 0.4 MG SL SUBL
0.4000 mg | SUBLINGUAL_TABLET | SUBLINGUAL | Status: DC | PRN
Start: 2013-08-28 — End: 2013-08-29
  Administered 2013-08-28: 0.4 mg via SUBLINGUAL

## 2013-08-28 NOTE — Discharge Instructions (Signed)

## 2013-08-28 NOTE — ED Notes (Signed)
Nurse first rounds, pt reports increase in chest pressure. 2nd EKG done and shown to Dr. Doy Mince, no changes.

## 2013-08-28 NOTE — ED Notes (Signed)
Pt back in room. Attached to monitor. POC Preg sent to mini lab.

## 2013-08-28 NOTE — ED Provider Notes (Signed)
I saw and evaluated the patient, reviewed the resident's note and I agree with the findings and plan.   EKG Interpretation   Date/Time:  Friday August 28 2013 20:53:35 EDT Ventricular Rate:  87 PR Interval:  154 QRS Duration: 94 QT Interval:  374 QTC Calculation: 450 R Axis:   76 Text Interpretation:  Normal sinus rhythm Normal ECG compared to prior,  rate improved. Confirmed by [facility]  MD, TREY [phone]) on 08/28/2013 10:42:40  PM      33 year old female presenting with atypical chest pain.  No chest pain at time of my interview. On exam, nontoxic, well-appearing, lungs clear to auscultation bilaterally, heart sounds normal with regular rate and rhythm.  Plan outpatient management.  Given return precautions.    Houston Siren III, MD 08/29/13 [phone]

## 2013-08-28 NOTE — ED Provider Notes (Signed)
CSN: [id number]     Arrival date & time 08/28/13  1814 History   First MD Initiated Contact with Patient 08/28/13 2200     Chief Complaint  Patient presents with  . Chest Pain     (Consider location/radiation/quality/duration/timing/severity/associated sxs/prior Treatment) The history is provided by the patient.   history ofpresent illness: 33 year old female who presents with chief complaint of chest pain. Patient states that she had an episode last night where she felt "tightness" in her chest, felt short of breath, and had a tingling sensation in bilateral hands. Symptoms lasted 15-20 minutes and resolved. Patient felt okay today until later this afternoon she was at work and had a similar episode. Symptoms resolved prior to arrival. Patient currently asymptomatic. She can identify no inciting or alleviating factors. Symptoms resolved on their own. No history of recent travel, surgery, immobilization, or illness. No exogenous estrogen use. No prior history of blood clot.  Past Medical History  Diagnosis Date  . Obesity   . PCOS (polycystic ovarian syndrome)   . Insulin resistance    Past Surgical History  Procedure Laterality Date  . Cholecystectomy    . Dilation and curettage of uterus    . Dilatation & currettage/hysteroscopy with resectocope N/A 10/17/2012    Procedure: DILATATION & CURETTAGE/HYSTEROSCOPY WITH RESECTOCOPE; Possible Polypectomy, Possible Resectoscopic Myomectomy.;  Surgeon: Floyce Stakes. Pamala Hurry, MD;  Location: [facility];  Service: Gynecology;  Laterality: N/A;  1 hr.  . Intrauterine device (iud) insertion N/A 10/17/2012    Procedure: INTRAUTERINE DEVICE (IUD) INSERTION; Mirena;  Surgeon: Floyce Stakes. Pamala Hurry, MD;  Location: [facility];  Service: Gynecology;  Laterality: N/A;   No family history on file. History  Substance Use Topics  . Smoking status: Former Smoker    Types: Cigarettes  . Smokeless tobacco: Not on file  . Alcohol Use: No   OB History   Grav Para Term  Preterm Abortions TAB SAB Ect Mult Living                 Review of Systems  Constitutional: Negative for fever and chills.  HENT: Negative for congestion.   Eyes: Negative for pain.  Respiratory: Positive for shortness of breath.   Cardiovascular: Positive for chest pain. Negative for palpitations and leg swelling.  Gastrointestinal: Negative for nausea, vomiting, abdominal pain, diarrhea and constipation.  Genitourinary: Negative for dysuria.  Musculoskeletal: Negative for back pain.  Skin: Negative for rash and wound.  Neurological: Negative for headaches.  All other systems reviewed and are negative.     Allergies  Iohexol  Home Medications   Prior to Admission medications   Medication Sig Start Date End Date Taking? Authorizing Provider  albuterol (PROVENTIL HFA;VENTOLIN HFA) 108 (90 BASE) MCG/ACT inhaler Inhale 2 puffs into the lungs every 4 (four) hours as needed for wheezing or shortness of breath. 03/18/13  Yes Janne Napoleon, NP  diazepam (VALIUM) 5 MG tablet Take 5 mg by mouth every 8 (eight) hours as needed for anxiety.   Yes Historical Provider, MD  Prenatal Vit-Fe Fumarate-FA (PRENATAL MULTIVITAMIN) TABS Take 1 tablet by mouth daily at 12 noon.   Yes Historical Provider, MD   BP 121/79  Pulse 81  Temp(Src) 98.7 F (37.1 C)  Resp 20  Ht 5\' 8"  (1.727 m)  Wt 310 lb (140.615 kg)  BMI 47.15 kg/m2  SpO2 98% Physical Exam  Nursing note and vitals reviewed. Constitutional: She is oriented to person, place, and time. She appears well-developed and well-nourished. No distress.  HENT:  Head: Normocephalic and atraumatic.  Eyes: Conjunctivae are normal.  Neck: Neck supple.  Cardiovascular: Normal rate, regular rhythm, normal heart sounds and intact distal pulses.   Pulmonary/Chest: Effort normal and breath sounds normal. She has no wheezes. She has no rales.  Abdominal: Soft. She exhibits no distension. There is no tenderness.  Musculoskeletal: Normal range of motion.  She exhibits no edema.  Neurological: She is alert and oriented to person, place, and time.  Skin: Skin is warm and dry.    ED Course  Procedures (including critical care time) Labs Review Labs Reviewed  POC URINE PREG, ED    Imaging Review Dg Chest 2 View  08/28/2013   CLINICAL DATA:  Left chest pain  EXAM: CHEST  2 VIEW  COMPARISON:  09/10/2011  FINDINGS: Lungs are clear.  No pleural effusion or pneumothorax.  The heart is normal in size.  Visualized osseous structures are within normal limits.  IMPRESSION: Normal chest radiographs.   Electronically Signed   By: Julian Hy M.D.   On: 08/28/2013 19:14     EKG Interpretation   Date/Time:  Friday August 28 2013 20:53:35 EDT Ventricular Rate:  87 PR Interval:  154 QRS Duration: 94 QT Interval:  374 QTC Calculation: 450 R Axis:   76 Text Interpretation:  Normal sinus rhythm Normal ECG compared to prior,  rate improved. Confirmed by [facility]  MD, Lytle Michaels ([id number]) on 08/28/2013 10:42:40  PM      MDM   Final diagnoses:  Chest pain    Preterm female with history of PCOS and anxiety who presents with 2 episodes of atypical chest pain, shortness of breath, and tingling of bilateral hands. Patient asymptomatic at time of exam. AF VSS. Exam as above.  Patient with a normal EKG, no signs of ischemia or infarction. Have low suspicion for ACS in this young patient without other medical problems or risk factors. PERC negative, and doubt PE. Chest x-ray normal.  No emergent cause for patient's chest pain identified and patient currently symptom-free. Feel patient appropriate for discharge home with PCP followup. Return precautions were given for repeat chest pain or concern for worsening symptoms.    Renaldo Reel, MD 08/28/13 [phone]

## 2013-08-28 NOTE — ED Notes (Signed)
Pt states chest pain since 0100. Initially it felt like pins and needles to L fingers. Since about 1300 toady it has been chest pain radiating to R arm, neck and under shoulder blades. Nausea present. Denies SOB. Pain 4.10 from nitro given in triage.

## 2013-08-28 NOTE — ED Notes (Signed)
The pt c/o some mid chest pain for 2 hours that she reports radiates up into her anterior neck  She has anxiety attacks but she does not think this is one.   Nothing makes the symptoms better or worse

## 2013-08-28 NOTE — ED Notes (Signed)
Ambulated to bathroom w steady gait. Pt giving urine sample now.

## 2013-08-29 NOTE — ED Provider Notes (Signed)
I saw and evaluated the patient, reviewed the resident's note and I agree with the findings and plan.   EKG Interpretation   Date/Time:  Friday August 28 2013 20:53:35 EDT Ventricular Rate:  87 PR Interval:  154 QRS Duration: 94 QT Interval:  374 QTC Calculation: 450 R Axis:   76 Text Interpretation:  Normal sinus rhythm Normal ECG compared to prior,  rate improved. Confirmed by [facility]  MD, TREY [phone]) on 08/28/2013 10:42:40  PM        Houston Siren III, MD 08/29/13 [phone]

## 2013-10-09 ENCOUNTER — Encounter: Payer: No Typology Code available for payment source | Attending: Obstetrics | Admitting: Dietician

## 2013-10-09 ENCOUNTER — Encounter: Payer: Self-pay | Admitting: Dietician

## 2013-10-09 VITALS — Ht 69.0 in | Wt 314.5 lb

## 2013-10-09 DIAGNOSIS — Z6841 Body Mass Index (BMI) 40.0 and over, adult: Secondary | ICD-10-CM | POA: Insufficient documentation

## 2013-10-09 DIAGNOSIS — Z713 Dietary counseling and surveillance: Secondary | ICD-10-CM | POA: Insufficient documentation

## 2013-10-09 DIAGNOSIS — E669 Obesity, unspecified: Secondary | ICD-10-CM | POA: Insufficient documentation

## 2013-10-09 NOTE — Progress Notes (Signed)
  Medical Nutrition Therapy:  Appt start time: 1000 end time:  1030.   Assessment:  Primary concerns today: [name] [name] is here today stating that she wants to get healthy. She reports that she gained a lot of weight in the past 6 years: PCOS, stopped going to the gym, started eating badly. Always heavy as a child and her parents required her to clean her plate, even if she was full. "No self control" with food. She works for [employer] from [employer], and is sometimes at work until [place] lives with her 45 year old son. Her husband works out of town and is home every other week. Shenekia reports that she doesn't sleep well and has some anxiety during the night.  Preferred Learning Style:  No preference indicated   Learning Readiness:   Ready   MEDICATIONS: see list   DIETARY INTAKE:  24-hr recall:  B ( AM): McD[employer] bacon egg and cheese and hashbrown with water  Snk ( AM): peanuts or almonds  L ( PM): Subway 6 inch sub or Chickfila Snk ( PM):  D ( PM): sometimes skips; pizza  Snk ( PM): none (sometimes wakes up and eat in the middle of the night and has leftovers)  Beverages: lots of water  Usual physical activity: recently started back at the gym 3 weeks ago (none since 2011); 30 min cardio and 30 min of weights 3x a week  Estimated energy needs: 1800-2000 calories 200-225 g carbohydrates 135-150 g protein 50-56 g fat  Progress Towards Goal(s):  Some progress.   Nutritional Diagnosis:  Tice-3.3 Overweight/obesity As related to history of sedentary lifestyle, excessive energy intake, and inappropriate food choices.  As evidenced by BMI 46.5 and prediabetes dx.    Intervention:  Nutrition education provided. Discussed a carbohydrate-controlled diet and importance of regular exercise.   Teaching Method Utilized:  Visual Auditory  Handouts given during visit include:  MyPlate  10Y CHO + protein snacks  Barriers to learning/adherence to lifestyle change:  none  Demonstrated degree of understanding via:  Teach Back   Monitoring/Evaluation:  Dietary intake, exercise, and body weight in 2 month(s).

## 2013-10-09 NOTE — Patient Instructions (Signed)
-  Fill up 1/2 your plate with non-starchy vegetables (any veggie that is not corn, peas, or potatoes) -Avoid keeping "trigger" foods in the house -Limit fast food to 3x for breakfast and 2x in the evenings  -protein smoothies, English muffin with cheese or peanut butter, fruit and nuts, yogurt

## 2013-11-20 ENCOUNTER — Other Ambulatory Visit: Payer: Self-pay | Admitting: Internal Medicine

## 2013-11-20 DIAGNOSIS — R1030 Lower abdominal pain, unspecified: Secondary | ICD-10-CM

## 2013-11-24 ENCOUNTER — Ambulatory Visit
Admission: RE | Admit: 2013-11-24 | Discharge: 2013-11-24 | Disposition: A | Discharge status: Home / Self Care | Payer: No Typology Code available for payment source | Source: Ambulatory Visit | Attending: Internal Medicine | Admitting: Internal Medicine

## 2013-11-24 DIAGNOSIS — R1030 Lower abdominal pain, unspecified: Secondary | ICD-10-CM

## 2013-11-25 ENCOUNTER — Encounter (HOSPITAL_COMMUNITY): Payer: Self-pay | Admitting: Pharmacy Technician

## 2013-11-25 ENCOUNTER — Other Ambulatory Visit: Payer: Self-pay | Admitting: Obstetrics

## 2013-11-26 ENCOUNTER — Encounter (HOSPITAL_COMMUNITY): Payer: Self-pay | Admitting: *Deleted

## 2013-12-01 ENCOUNTER — Ambulatory Visit (HOSPITAL_COMMUNITY): Payer: No Typology Code available for payment source | Admitting: Anesthesiology

## 2013-12-01 ENCOUNTER — Encounter (HOSPITAL_COMMUNITY): Payer: No Typology Code available for payment source | Admitting: Anesthesiology

## 2013-12-01 ENCOUNTER — Ambulatory Visit (HOSPITAL_COMMUNITY)
Admission: RE | Admit: 2013-12-01 | Discharge: 2013-12-01 | Disposition: A | Discharge status: Home / Self Care | Payer: No Typology Code available for payment source | Source: Ambulatory Visit | Attending: Obstetrics | Admitting: Obstetrics

## 2013-12-01 ENCOUNTER — Encounter (HOSPITAL_COMMUNITY)
Admission: RE | Disposition: A | Discharge status: Home / Self Care | Payer: Self-pay | Source: Ambulatory Visit | Attending: Obstetrics

## 2013-12-01 DIAGNOSIS — E785 Hyperlipidemia, unspecified: Secondary | ICD-10-CM | POA: Insufficient documentation

## 2013-12-01 DIAGNOSIS — T8339XA Other mechanical complication of intrauterine contraceptive device, initial encounter: Secondary | ICD-10-CM | POA: Insufficient documentation

## 2013-12-01 DIAGNOSIS — K219 Gastro-esophageal reflux disease without esophagitis: Secondary | ICD-10-CM | POA: Insufficient documentation

## 2013-12-01 DIAGNOSIS — E8881 Metabolic syndrome: Secondary | ICD-10-CM | POA: Insufficient documentation

## 2013-12-01 DIAGNOSIS — F410 Panic disorder [episodic paroxysmal anxiety] without agoraphobia: Secondary | ICD-10-CM | POA: Insufficient documentation

## 2013-12-01 DIAGNOSIS — N949 Unspecified condition associated with female genital organs and menstrual cycle: Secondary | ICD-10-CM | POA: Insufficient documentation

## 2013-12-01 DIAGNOSIS — E282 Polycystic ovarian syndrome: Secondary | ICD-10-CM | POA: Insufficient documentation

## 2013-12-01 DIAGNOSIS — Z6841 Body Mass Index (BMI) 40.0 and over, adult: Secondary | ICD-10-CM | POA: Insufficient documentation

## 2013-12-01 HISTORY — DX: Anxiety disorder, unspecified: F41.9

## 2013-12-01 HISTORY — DX: Other specified postprocedural states: Z98.890

## 2013-12-01 HISTORY — PX: HYSTEROSCOPY WITH D & C: SHX1775

## 2013-12-01 HISTORY — DX: Nausea with vomiting, unspecified: R11.2

## 2013-12-01 HISTORY — DX: Gastro-esophageal reflux disease without esophagitis: K21.9

## 2013-12-01 LAB — PREGNANCY, URINE: Preg Test, Ur: NEGATIVE

## 2013-12-01 LAB — CBC
HEMATOCRIT: 44.6 % (ref 36.0–46.0)
Hemoglobin: 14.9 g/dL (ref 12.0–15.0)
MCH: 28.2 pg (ref 26.0–34.0)
MCHC: 33.4 g/dL (ref 30.0–36.0)
MCV: 84.3 fL (ref 78.0–100.0)
PLATELETS: 263 10*3/uL (ref 150–400)
RBC: 5.29 MIL/uL — ABNORMAL HIGH (ref 3.87–5.11)
RDW: 14 % (ref 11.5–15.5)
WBC: 10 10*3/uL (ref 4.0–10.5)

## 2013-12-01 LAB — TYPE AND SCREEN
ABO/RH(D): A POS
ANTIBODY SCREEN: NEGATIVE

## 2013-12-01 LAB — BASIC METABOLIC PANEL
ANION GAP: 12 (ref 5–15)
BUN: 12 mg/dL (ref 6–23)
CALCIUM: 9.6 mg/dL (ref 8.4–10.5)
CO2: 26 meq/L (ref 19–32)
CREATININE: 0.85 mg/dL (ref 0.50–1.10)
Chloride: 100 mEq/L (ref 96–112)
GFR calc Af Amer: >90 mL/min (ref >90)
GFR calc non Af Amer: 89 mL/min — ABNORMAL LOW (ref >90)
Glucose, Bld: 111 mg/dL — ABNORMAL HIGH (ref 70–99)
Potassium: 4.3 mEq/L (ref 3.7–5.3)
Sodium: 138 mEq/L (ref 137–147)

## 2013-12-01 SURGERY — DILATATION AND CURETTAGE /HYSTEROSCOPY
Anesthesia: General | Site: Vagina

## 2013-12-01 MED ORDER — ONDANSETRON HCL 4 MG/2ML IJ SOLN
INTRAMUSCULAR | Status: DC | PRN
  Administered 2013-12-01: 4 mg via INTRAVENOUS

## 2013-12-01 MED ORDER — MEPERIDINE HCL 25 MG/ML IJ SOLN: 6.2500 mg | INTRAMUSCULAR | Status: DC | PRN

## 2013-12-01 MED ORDER — LACTATED RINGERS IV SOLN
INTRAVENOUS | Status: DC
  Administered 2013-12-01: 13:00:00 via INTRAVENOUS

## 2013-12-01 MED ORDER — FENTANYL CITRATE 0.05 MG/ML IJ SOLN
INTRAMUSCULAR | Status: DC | PRN
  Administered 2013-12-01 (×2): 100 ug via INTRAVENOUS

## 2013-12-01 MED ORDER — SCOPOLAMINE 1 MG/3DAYS TD PT72
1.0000 | MEDICATED_PATCH | Freq: Once | TRANSDERMAL | Status: DC
  Administered 2013-12-01: 1.5 mg via TRANSDERMAL

## 2013-12-01 MED ORDER — OXYCODONE-ACETAMINOPHEN 5-325 MG PO TABS: 2.0000 | ORAL_TABLET | ORAL | Status: DC | PRN

## 2013-12-01 MED ORDER — PROMETHAZINE HCL 25 MG/ML IJ SOLN: 6.2500 mg | INTRAMUSCULAR | Status: DC | PRN

## 2013-12-01 MED ORDER — MIDAZOLAM HCL 2 MG/2ML IJ SOLN
INTRAMUSCULAR | Status: DC | PRN
  Administered 2013-12-01: 2 mg via INTRAVENOUS

## 2013-12-01 MED ORDER — SCOPOLAMINE 1 MG/3DAYS TD PT72
MEDICATED_PATCH | TRANSDERMAL | Status: AC
  Filled 2013-12-01: qty 1

## 2013-12-01 MED ORDER — MIDAZOLAM HCL 2 MG/2ML IJ SOLN
INTRAMUSCULAR | Status: AC
  Filled 2013-12-01: qty 2

## 2013-12-01 MED ORDER — FENTANYL CITRATE 0.05 MG/ML IJ SOLN
INTRAMUSCULAR | Status: AC
  Filled 2013-12-01: qty 2

## 2013-12-01 MED ORDER — DEXTROSE 5 % IV SOLN
100.0000 mg | Freq: Once | INTRAVENOUS | Status: AC
  Administered 2013-12-01: 100 mg via INTRAVENOUS
  Filled 2013-12-01: qty 100

## 2013-12-01 MED ORDER — KETOROLAC TROMETHAMINE 30 MG/ML IJ SOLN
15.0000 mg | Freq: Once | INTRAMUSCULAR | Status: AC | PRN
  Administered 2013-12-01: 30 mg via INTRAVENOUS

## 2013-12-01 MED ORDER — BUPIVACAINE HCL (PF) 0.25 % IJ SOLN
INTRAMUSCULAR | Status: DC | PRN
  Administered 2013-12-01: .5 mL

## 2013-12-01 MED ORDER — FENTANYL CITRATE 0.05 MG/ML IJ SOLN: 25.0000 ug | INTRAMUSCULAR | Status: DC | PRN

## 2013-12-01 MED ORDER — LIDOCAINE HCL (CARDIAC) 20 MG/ML IV SOLN
INTRAVENOUS | Status: DC | PRN
  Administered 2013-12-01: 50 mg via INTRAVENOUS

## 2013-12-01 MED ORDER — MIDAZOLAM HCL 2 MG/2ML IJ SOLN: 0.5000 mg | Freq: Once | INTRAMUSCULAR | Status: DC | PRN

## 2013-12-01 MED ORDER — BUPIVACAINE HCL (PF) 0.25 % IJ SOLN
INTRAMUSCULAR | Status: AC
  Filled 2013-12-01: qty 30

## 2013-12-01 MED ORDER — FAMOTIDINE IN NACL 20-0.9 MG/50ML-% IV SOLN
20.0000 mg | Freq: Once | INTRAVENOUS | Status: AC
  Administered 2013-12-01: 20 mg via INTRAVENOUS
  Filled 2013-12-01: qty 50

## 2013-12-01 MED ORDER — PROPOFOL 10 MG/ML IV EMUL
INTRAVENOUS | Status: AC
  Filled 2013-12-01: qty 20

## 2013-12-01 MED ORDER — KETOROLAC TROMETHAMINE 30 MG/ML IJ SOLN
INTRAMUSCULAR | Status: AC
  Filled 2013-12-01: qty 1

## 2013-12-01 MED ORDER — PROPOFOL 10 MG/ML IV BOLUS
INTRAVENOUS | Status: DC | PRN
  Administered 2013-12-01: 300 mg via INTRAVENOUS

## 2013-12-01 MED ORDER — DEXAMETHASONE SODIUM PHOSPHATE 10 MG/ML IJ SOLN
INTRAMUSCULAR | Status: DC | PRN
  Administered 2013-12-01: 10 mg via INTRAVENOUS

## 2013-12-01 SURGICAL SUPPLY — 54 items
BARRIER ADHS 3X4 INTERCEED (GAUZE/BANDAGES/DRESSINGS) IMPLANT
BLADE 15 SAFETY STRL DISP (BLADE) IMPLANT
BLADE SURG 11 STRL SS (BLADE) IMPLANT
CABLE HIGH FREQUENCY MONO STRZ (ELECTRODE) IMPLANT
CANISTER SUCT 3000ML (MISCELLANEOUS) IMPLANT
CATH ROBINSON RED A/P 16FR (CATHETERS) ×3 IMPLANT
CHLORAPREP W/TINT 26ML (MISCELLANEOUS) ×3 IMPLANT
CLOTH BEACON ORANGE TIMEOUT ST (SAFETY) IMPLANT
CONTAINER PREFILL 10% NBF 60ML (FORM) IMPLANT
COVER MAYO STAND STRL (DRAPES) IMPLANT
DERMABOND ADVANCED (GAUZE/BANDAGES/DRESSINGS)
DERMABOND ADVANCED .7 DNX12 (GAUZE/BANDAGES/DRESSINGS) IMPLANT
DRAPE HYSTEROSCOPY (DRAPE) ×3 IMPLANT
DRSG COVADERM PLUS 2X2 (GAUZE/BANDAGES/DRESSINGS) IMPLANT
DRSG OPSITE POSTOP 3X4 (GAUZE/BANDAGES/DRESSINGS) IMPLANT
ELECT NEEDLE TIP 2.8 STRL (NEEDLE) IMPLANT
ELECT REM PT RETURN 9FT ADLT (ELECTROSURGICAL)
ELECTRODE REM PT RTRN 9FT ADLT (ELECTROSURGICAL) IMPLANT
FORCEPS CUTTING 33CM 5MM (CUTTING FORCEPS) IMPLANT
GLOVE BIO SURGEON STRL SZ 6.5 (GLOVE) ×3 IMPLANT
GLOVE BIOGEL PI IND STRL 7.0 (GLOVE) ×2 IMPLANT
GLOVE BIOGEL PI INDICATOR 7.0 (GLOVE) ×1
GOWN STRL REUS W/TWL LRG LVL3 (GOWN DISPOSABLE) ×6 IMPLANT
IV STOPCOCK 4 WAY 40  W/Y SET (IV SOLUTION)
IV STOPCOCK 4 WAY 40 W/Y SET (IV SOLUTION) IMPLANT
LOOP ANGLED CUTTING 22FR (CUTTING LOOP) IMPLANT
MANIPULATOR UTERINE 4.5 ZUMI (MISCELLANEOUS) IMPLANT
NEEDLE INSUFFLATION 120MM (ENDOMECHANICALS) IMPLANT
PACK LAPAROSCOPY BASIN (CUSTOM PROCEDURE TRAY) IMPLANT
PACK VAGINAL MINOR WOMEN LF (CUSTOM PROCEDURE TRAY) ×3 IMPLANT
PAD OB MATERNITY 4.3X12.25 (PERSONAL CARE ITEMS) ×3 IMPLANT
PENCIL BUTTON HOLSTER BLD 10FT (ELECTRODE) IMPLANT
POUCH SPECIMEN RETRIEVAL 10MM (ENDOMECHANICALS) IMPLANT
PROTECTOR NERVE ULNAR (MISCELLANEOUS) IMPLANT
SCISSORS LAP 5X35 DISP (ENDOMECHANICALS) IMPLANT
SEALER TISSUE G2 CVD JAW 35 (ENDOMECHANICALS) IMPLANT
SEALER TISSUE G2 CVD JAW 45CM (ENDOMECHANICALS)
SET IRRIG TUBING LAPAROSCOPIC (IRRIGATION / IRRIGATOR) IMPLANT
SET TUBING HYSTEROSCOPY 2 NDL (TUBING) IMPLANT
SOLUTION ELECTROLUBE (MISCELLANEOUS) IMPLANT
STENT BALLN UTERINE 3CM 6FR (Stent) IMPLANT
STENT BALLN UTERINE 4CM 6FR (STENTS) IMPLANT
SUT VICRYL 0 UR6 27IN ABS (SUTURE) IMPLANT
SUT VICRYL 4-0 PS2 18IN ABS (SUTURE) IMPLANT
SYR 5ML LL (SYRINGE) ×3 IMPLANT
SYRINGE 60CC LL (MISCELLANEOUS) IMPLANT
TOWEL OR 17X24 6PK STRL BLUE (TOWEL DISPOSABLE) ×6 IMPLANT
TRAY FOLEY CATH 14FR (SET/KITS/TRAYS/PACK) IMPLANT
TROCAR BALLN 12MMX100 BLUNT (TROCAR) IMPLANT
TROCAR XCEL NON-BLD 11X100MML (ENDOMECHANICALS) IMPLANT
TROCAR XCEL NON-BLD 5MMX100MML (ENDOMECHANICALS) IMPLANT
TUBE HYSTEROSCOPY W Y-CONNECT (TUBING) IMPLANT
WARMER LAPAROSCOPE (MISCELLANEOUS) IMPLANT
WATER STERILE IRR 1000ML POUR (IV SOLUTION) ×3 IMPLANT

## 2013-12-01 NOTE — Anesthesia Postprocedure Evaluation (Signed)
  Anesthesia Post Note  Patient: [name] [name] [facility]  Procedure(s) Performed: Procedure(s) (LRB): DILATATION AND CURETTAGE /HYSTEROSCOPY With IUD Removal (N/A) Possible LAPAROSCOPIC IUD Removal (N/A)  Anesthesia type: GA  Patient location: PACU  Post pain: Pain level controlled  Post assessment: Post-op Vital signs reviewed  Last Vitals:  Filed Vitals:   12/01/13 1456  BP: 108/45  Pulse: 78  Temp: 37.1 C  Resp: 16    Post vital signs: Reviewed  Level of consciousness: sedated  Complications: No apparent anesthesia complications

## 2013-12-01 NOTE — Brief Op Note (Signed)
12/01/2013  2:55 PM  PATIENT:  Jackqulyn Livings [place]ak[place]  33 y.o. female  PRE-OPERATIVE DIAGNOSIS:  Malpositioned IUD with possible uterine perforation  POST-OPERATIVE DIAGNOSIS:  Malpositioned IUD  PROCEDURE:  IUD removal  SURGEON:  Surgeon(s) and Role:    * Kailin Leu A. Pamala Hurry, MD - Primary  PHYSICIAN ASSISTANT: none  ASSISTANTS: none   ANESTHESIA:   MAC  EBL:  Total I/O In: 900 [I.V.:900] Out: 100 [Urine:100]  BLOOD ADMINISTERED:none  DRAINS: none   LOCAL MEDICATIONS USED:  MARCAINE 1/4 %, 2 cc  SPECIMEN:  No Specimen  DISPOSITION OF SPECIMEN:  N/A  COUNTS:  YES  TOURNIQUET:  * No tourniquets in log *  DICTATION: .Note written in EPIC  PLAN OF CARE: Discharge to home after PACU  PATIENT DISPOSITION:  PACU - hemodynamically stable.   Delay start of Pharmacological VTE agent (>24hrs) due to surgical blood loss or risk of bleeding: yes

## 2013-12-01 NOTE — Anesthesia Preprocedure Evaluation (Signed)
Anesthesia Evaluation  Patient identified by MRN, date of birth, ID band Patient awake    Reviewed: Allergy & Precautions, H&P , Patient's Chart, lab work & pertinent test results, reviewed documented beta blocker date and time   History of Anesthesia Complications Negative for: history of anesthetic complications  Airway Mallampati: II TM Distance: >3 FB Neck ROM: full    Dental   Pulmonary former smoker,  breath sounds clear to auscultation        Cardiovascular Exercise Tolerance: Good Rhythm:regular Rate:Normal     Neuro/Psych PSYCHIATRIC DISORDERS negative psych ROS   GI/Hepatic GERD-  Controlled,  Endo/Other  diabetesMorbid obesity  Renal/GU      Musculoskeletal   Abdominal   Peds  Hematology   Anesthesia Other Findings   Reproductive/Obstetrics                           Anesthesia Physical Anesthesia Plan  ASA: III  Anesthesia Plan: General LMA   Post-op Pain Management:    Induction:   Airway Management Planned:   Additional Equipment:   Intra-op Plan:   Post-operative Plan:   Informed Consent: I have reviewed the patients History and Physical, chart, labs and discussed the procedure including the risks, benefits and alternatives for the proposed anesthesia with the patient or authorized representative who has indicated his/her understanding and acceptance.   Dental Advisory Given  Plan Discussed with: CRNA, Surgeon and Anesthesiologist  Anesthesia Plan Comments:         Anesthesia Quick Evaluation

## 2013-12-01 NOTE — H&P (Signed)
See H&P in chart/ scanned docs.  CC: pelvic pain, malpositioned IUD  HPI: 33 yo w/ pelvic pain and CT showing malpositioned IUD. U/s confirmed, possible uterine perforation vs IUD embedded into myometrium. For removal today.  Past Medical History  Diagnosis Date  . Obesity   . PCOS (polycystic ovarian syndrome)   . Insulin resistance   . Hyperlipidemia   . PONV (postoperative nausea and vomiting)   . Anxiety     panic attacks  . GERD (gastroesophageal reflux disease)     Past Surgical History  Procedure Laterality Date  . Cholecystectomy    . Dilation and curettage of uterus    . Dilatation & currettage/hysteroscopy with resectocope N/A 10/17/2012    Procedure: DILATATION & CURETTAGE/HYSTEROSCOPY WITH RESECTOCOPE; Possible Polypectomy, Possible Resectoscopic Myomectomy.;  Surgeon: Floyce Stakes. Pamala Hurry, MD;  Location: [facility];  Service: Gynecology;  Laterality: N/A;  1 hr.  . Intrauterine device (iud) insertion N/A 10/17/2012    Procedure: INTRAUTERINE DEVICE (IUD) INSERTION; Mirena;  Surgeon: Floyce Stakes. Pamala Hurry, MD;  Location: [facility];  Service: Gynecology;  Laterality: N/A;    Meds: Reglan prn, anti-anxiety prn  PE: Filed Vitals:   12/01/13 1230  BP: 129/61  Pulse: 82  Temp: 98.4 F (36.9 C)  Resp: 20   Gen: well appearing. Abd: obese, NT LE: NT, no edema GU/CV/Pulm- per office note  CBC    Component Value Date/Time   WBC 10.0 12/01/2013 1218   RBC 5.29* 12/01/2013 1218   HGB 14.9 12/01/2013 1218   HCT 44.6 12/01/2013 1218   PLT 263 12/01/2013 1218   MCV 84.3 12/01/2013 1218   MCH 28.2 12/01/2013 1218   MCHC 33.4 12/01/2013 1218   RDW 14.0 12/01/2013 1218   LYMPHSABS 1.5 07/18/2012 2242   MONOABS 0.4 07/18/2012 2242   EOSABS 0.0 07/18/2012 2242   BASOSABS 0.0 07/18/2012 2242    A/P: Malpositioned IUD To OR as discussed  Khristie Sak A. 12/01/2013 2:06 PM

## 2013-12-01 NOTE — Discharge Instructions (Signed)

## 2013-12-01 NOTE — Op Note (Signed)
12/01/2013  2:55 PM  PATIENT:  [name] [name]  33 y.o. female  PRE-OPERATIVE DIAGNOSIS:  Malpositioned IUD with possible uterine perforation  POST-OPERATIVE DIAGNOSIS:  Malpositioned IUD  PROCEDURE:  IUD removal  SURGEON:  Surgeon(s) and Role:    * Isley Zinni A. Pamala Hurry, MD - Primary  PHYSICIAN ASSISTANT: none  ASSISTANTS: none   ANESTHESIA:   MAC  EBL:  Total I/O In: 900 [I.V.:900] Out: 100 [Urine:100]  BLOOD ADMINISTERED:none  DRAINS: none   LOCAL MEDICATIONS USED:  MARCAINE 1/4 %, 2 cc  SPECIMEN:  No Specimen  DISPOSITION OF SPECIMEN:  N/A  COUNTS:  YES  TOURNIQUET:  * No tourniquets in log *  DICTATION: .Note written in EPIC  PLAN OF CARE: Discharge to home after PACU  PATIENT DISPOSITION:  PACU - hemodynamically stable.   Delay start of Pharmacological VTE agent (>24hrs) due to surgical blood loss or risk of bleeding: yes  Procedure: Informed consent obtained. Pt taken to OR. MAC anasthesia intitiated, pt prepped and draped in sterile lithotomy position. Straight cath done. Time out done. Sterile speculum placed into vagina. 2cc anasthestic placed into cervix at 12 o'clock. Tenaculum applied. With Retraction of vaginal wall, speculum and tenaculum, entire cvx visualized. IUD strings seen, grasped with [name] [name] and with gentle traction IUD removed easily. All instruments removed. Pt tolerated procedure well. Counts correct.  Tykera Skates A. 12/01/2013 3:53 PM

## 2013-12-01 NOTE — Transfer of Care (Signed)
Immediate Anesthesia Transfer of Care Note  Patient: [name] [name] [facility]  Procedure(s) Performed: Procedure(s) with comments: DILATATION AND CURETTAGE /HYSTEROSCOPY With IUD Removal (N/A) - 90 min. Possible LAPAROSCOPIC IUD Removal (N/A)  Patient Location: PACU  Anesthesia Type:General  Level of Consciousness: awake, alert  and oriented  Airway & Oxygen Therapy: Patient Spontanous Breathing and Patient connected to nasal cannula oxygen  Post-op Assessment: Report given to PACU RN and Post -op Vital signs reviewed and stable  Post vital signs: Reviewed and stable  Complications: No apparent anesthesia complications

## 2013-12-02 ENCOUNTER — Encounter (HOSPITAL_COMMUNITY): Payer: Self-pay | Admitting: Obstetrics

## 2013-12-23 ENCOUNTER — Ambulatory Visit: Payer: Self-pay | Admitting: Dietician

## 2014-01-21 ENCOUNTER — Encounter: Payer: No Typology Code available for payment source | Attending: Obstetrics | Admitting: Dietician

## 2014-01-21 VITALS — Wt 294.4 lb

## 2014-01-21 DIAGNOSIS — Z6841 Body Mass Index (BMI) 40.0 and over, adult: Secondary | ICD-10-CM | POA: Diagnosis not present

## 2014-01-21 DIAGNOSIS — E119 Type 2 diabetes mellitus without complications: Secondary | ICD-10-CM | POA: Insufficient documentation

## 2014-01-21 DIAGNOSIS — E669 Obesity, unspecified: Secondary | ICD-10-CM | POA: Insufficient documentation

## 2014-01-21 DIAGNOSIS — Z713 Dietary counseling and surveillance: Secondary | ICD-10-CM | POA: Insufficient documentation

## 2014-01-21 NOTE — Progress Notes (Signed)
  Medical Nutrition Therapy:  Appt start time: 810 end time:  830.   Follow-up:   [name] [name] returns today having lost 25 pounds since June 2015. However, she has had a lot of stress in the last few months. She broke 3 toes and also had to have her IUD removed as it perforated her uterus. Additionally, her husband had to have an emergency tonsillectomy and her son endured 2nd degree burns from a firework. She was recently diagnosed with type 2 diabetes with a HgbA1c of 6.8%. She is discouraged by this as she has been exercising regularly and practicing a healthy diet. She states that she returns because she "needs help" regarding her diet for diabetes management.  Preferred Learning Style:  No preference indicated   Learning Readiness:   Ready   MEDICATIONS: see list   DIETARY INTAKE:  24-hr recall:  B ( AM): 2 egg veggie and ham omelet with water Snk ( AM):  L ( PM): 1/2 [employer] salad Snk ( PM): almonds D ( PM): 1/2 [employer] salad or [employer] roasted chicken   Snk ( PM):   Beverages: lots of water, sometimes soda, Sprite when nauseated  Estimated energy needs: 1800-2000 calories 200-225 g carbohydrates 135-150 g protein 50-56 g fat  Progress Towards Goal(s):  Some progress.   Nutritional Diagnosis:  [place]-3.3 Overweight/obesity As related to history of sedentary lifestyle, excessive energy intake, and inappropriate food choices.  As evidenced by BMI 46.5 and prediabetes dx.    Intervention:  Nutrition education provided. Explained HgbA1c. Briefly reviewed steady carbohydrate diet and portion sizes. Emphasized importance of continued exercise and weight loss. Encouraged patient to keep glucose tablets, a juice box, or hard candy on hand for low blood sugars.  Teaching Method Utilized:  Visual Auditory  Handouts given during visit include:  CHO choices (yellow card)  Living Well with Diabetes booklet  Barriers to learning/adherence to lifestyle change:  none  Demonstrated degree of understanding via:  Teach Back   Monitoring/Evaluation:  Dietary intake, exercise, HgbA1c and body weight as soon as possible.

## 2014-01-22 ENCOUNTER — Encounter: Payer: No Typology Code available for payment source | Admitting: *Deleted

## 2014-01-22 ENCOUNTER — Encounter (HOSPITAL_COMMUNITY): Payer: Self-pay | Admitting: Emergency Medicine

## 2014-01-22 ENCOUNTER — Emergency Department (HOSPITAL_COMMUNITY)
Admission: EM | Admit: 2014-01-22 | Discharge: 2014-01-22 | Disposition: A | Discharge status: Home / Self Care | Payer: No Typology Code available for payment source | Attending: Emergency Medicine | Admitting: Emergency Medicine

## 2014-01-22 DIAGNOSIS — Z79899 Other long term (current) drug therapy: Secondary | ICD-10-CM | POA: Diagnosis not present

## 2014-01-22 DIAGNOSIS — E119 Type 2 diabetes mellitus without complications: Secondary | ICD-10-CM | POA: Diagnosis not present

## 2014-01-22 DIAGNOSIS — Z3202 Encounter for pregnancy test, result negative: Secondary | ICD-10-CM | POA: Diagnosis not present

## 2014-01-22 DIAGNOSIS — Z87891 Personal history of nicotine dependence: Secondary | ICD-10-CM | POA: Insufficient documentation

## 2014-01-22 DIAGNOSIS — E669 Obesity, unspecified: Secondary | ICD-10-CM | POA: Insufficient documentation

## 2014-01-22 DIAGNOSIS — H8110 Benign paroxysmal vertigo, unspecified ear: Secondary | ICD-10-CM | POA: Insufficient documentation

## 2014-01-22 DIAGNOSIS — K219 Gastro-esophageal reflux disease without esophagitis: Secondary | ICD-10-CM | POA: Insufficient documentation

## 2014-01-22 DIAGNOSIS — R110 Nausea: Secondary | ICD-10-CM | POA: Diagnosis not present

## 2014-01-22 DIAGNOSIS — F411 Generalized anxiety disorder: Secondary | ICD-10-CM | POA: Diagnosis not present

## 2014-01-22 DIAGNOSIS — R51 Headache: Secondary | ICD-10-CM | POA: Insufficient documentation

## 2014-01-22 LAB — POC URINE PREG, ED: Preg Test, Ur: NEGATIVE

## 2014-01-22 LAB — CBC WITH DIFFERENTIAL/PLATELET
Basophils Absolute: 0 10*3/uL (ref 0.0–0.1)
Basophils Relative: 0 % (ref 0–1)
Eosinophils Absolute: 0.1 10*3/uL (ref 0.0–0.7)
Eosinophils Relative: 1 % (ref 0–5)
HEMATOCRIT: 40.3 % (ref 36.0–46.0)
Hemoglobin: 13.5 g/dL (ref 12.0–15.0)
LYMPHS ABS: 1.7 10*3/uL (ref 0.7–4.0)
LYMPHS PCT: 20 % (ref 12–46)
MCH: 28.2 pg (ref 26.0–34.0)
MCHC: 33.5 g/dL (ref 30.0–36.0)
MCV: 84.3 fL (ref 78.0–100.0)
Monocytes Absolute: 0.7 10*3/uL (ref 0.1–1.0)
Monocytes Relative: 7 % (ref 3–12)
Neutro Abs: 6.3 10*3/uL (ref 1.7–7.7)
Neutrophils Relative %: 72 % (ref 43–77)
Platelets: 266 10*3/uL (ref 150–400)
RBC: 4.78 MIL/uL (ref 3.87–5.11)
RDW: 13.5 % (ref 11.5–15.5)
WBC: 8.8 10*3/uL (ref 4.0–10.5)

## 2014-01-22 LAB — URINALYSIS, ROUTINE W REFLEX MICROSCOPIC
BILIRUBIN URINE: NEGATIVE
Glucose, UA: NEGATIVE mg/dL
Hgb urine dipstick: NEGATIVE
Ketones, ur: NEGATIVE mg/dL
LEUKOCYTES UA: NEGATIVE
NITRITE: NEGATIVE
PH: 7 (ref 5.0–8.0)
Protein, ur: NEGATIVE mg/dL
SPECIFIC GRAVITY, URINE: 1.016 (ref 1.005–1.030)
UROBILINOGEN UA: 0.2 mg/dL (ref 0.0–1.0)

## 2014-01-22 LAB — I-STAT CHEM 8, ED
BUN: 9 mg/dL (ref 6–23)
CREATININE: 0.9 mg/dL (ref 0.50–1.10)
Calcium, Ion: 1.23 mmol/L (ref 1.12–1.23)
Chloride: 104 mEq/L (ref 96–112)
GLUCOSE: 107 mg/dL — AB (ref 70–99)
HCT: 42 % (ref 36.0–46.0)
Hemoglobin: 14.3 g/dL (ref 12.0–15.0)
Potassium: 4 mEq/L (ref 3.7–5.3)
Sodium: 141 mEq/L (ref 137–147)
TCO2: 26 mmol/L (ref 0–100)

## 2014-01-22 LAB — CBG MONITORING, ED: Glucose-Capillary: 106 mg/dL — ABNORMAL HIGH (ref 70–99)

## 2014-01-22 MED ORDER — ONDANSETRON HCL 4 MG/2ML IJ SOLN
4.0000 mg | Freq: Once | INTRAMUSCULAR | Status: AC
  Administered 2014-01-22: 4 mg via INTRAVENOUS
  Filled 2014-01-22: qty 2

## 2014-01-22 MED ORDER — ACETAMINOPHEN 325 MG PO TABS
650.0000 mg | ORAL_TABLET | Freq: Once | ORAL | Status: AC
  Administered 2014-01-22: 650 mg via ORAL
  Filled 2014-01-22: qty 2

## 2014-01-22 MED ORDER — MECLIZINE HCL 25 MG PO TABS
25.0000 mg | ORAL_TABLET | Freq: Once | ORAL | Status: AC
  Administered 2014-01-22: 25 mg via ORAL
  Filled 2014-01-22: qty 1

## 2014-01-22 MED ORDER — MECLIZINE HCL 50 MG PO TABS: 50.0000 mg | ORAL_TABLET | Freq: Three times a day (TID) | ORAL | Status: DC | PRN

## 2014-01-22 MED ORDER — SODIUM CHLORIDE 0.9 % IV BOLUS (SEPSIS)
1000.0000 mL | Freq: Once | INTRAVENOUS | Status: AC
  Administered 2014-01-22: 1000 mL via INTRAVENOUS

## 2014-01-22 NOTE — Patient Instructions (Signed)
Plan:  Aim for 2 Carb Choices per meal (30 grams) +/- 1 either way  Aim for 0-1 Carbs per snack if hungry  Include protein in moderation with your meals and snacks Consider reading food labels for Total Carbohydrate of foods Continue with your activity level as tolerated, GOOD JOB! Ask MD for Rx for meter and consider checking BG at alternate times per day

## 2014-01-22 NOTE — ED Provider Notes (Signed)
CSN: [id number]     Arrival date & time 01/22/14  8756 History   First MD Initiated Contact with Patient 01/22/14 0945     Chief Complaint  Patient presents with  . Dizziness     (Consider location/radiation/quality/duration/timing/severity/associated sxs/prior Treatment) HPI  33 year old obese female with history of PCOS, GERD, insulin resistance presents for evaluations of dizziness. Patient report while sitting at the dinner table last night she began to develop a sensation of lightheadedness and dizziness. She described dizziness as room spinning sensation and feeling imbalance. Symptoms brought on with positional change, and improves with resting. She felt nauseated last night and took a phenergan which helps. She woke up today feeling normal however or sooner she got up the dizziness resumed. She then decided to come to ER for evaluation. At this time she complained a mild frontal headache. Endorse occasional cough that is nonproductive. She report unintentionally losing 7 pounds of weight this past week. Also endorsed having recurrent diarrhea, quantify as 3-5 bouts of loose stools that is nonbloody non-mucousy throughout the week. She was placed on metformin 3 weeks ago for early signs of diabetes. Her last menstrual period was June 16. She is not on any birth control medication. No recent sickness. Denies any dysuria, fever, chills, chest pain, shortness of breath, abdominal pain, back pain, or rash. Denies neck stiffness. No complaints of numbness or weakness. Her last A1c was 6.8.  Past Medical History  Diagnosis Date  . Obesity   . PCOS (polycystic ovarian syndrome)   . Insulin resistance   . Hyperlipidemia   . PONV (postoperative nausea and vomiting)   . Anxiety     panic attacks  . GERD (gastroesophageal reflux disease)    Past Surgical History  Procedure Laterality Date  . Cholecystectomy    . Dilation and curettage of uterus    . Dilatation & currettage/hysteroscopy with  resectocope N/A 10/17/2012    Procedure: DILATATION & CURETTAGE/HYSTEROSCOPY WITH RESECTOCOPE; Possible Polypectomy, Possible Resectoscopic Myomectomy.;  Surgeon: Floyce Stakes. Pamala Hurry, MD;  Location: [facility];  Service: Gynecology;  Laterality: N/A;  1 hr.  . Intrauterine device (iud) insertion N/A 10/17/2012    Procedure: INTRAUTERINE DEVICE (IUD) INSERTION; Mirena;  Surgeon: Floyce Stakes. Pamala Hurry, MD;  Location: [name];  Service: Gynecology;  Laterality: N/A;  . Hysteroscopy w/d&c N/A 12/01/2013    Procedure: IUD Removal;  Surgeon: Claiborne Billings A. Pamala Hurry, MD;  Location: [facility];  Service: Gynecology;  Laterality: N/A;  90 min.   Family History  Problem Relation Age of Onset  . Diabetes Father   . Diabetes Maternal Grandmother   . Diabetes Maternal Grandfather   . Diabetes Paternal Grandmother   . Diabetes Paternal Grandfather   . Diabetes Other   . Obesity Other   . Sleep apnea Other   . Hyperlipidemia Other   . Hypertension Other   . Cancer Other   . Heart disease Other    History  Substance Use Topics  . Smoking status: Former Smoker    Types: Cigarettes  . Smokeless tobacco: Not on file  . Alcohol Use: No   OB History   Grav Para Term Preterm Abortions TAB SAB Ect Mult Living                 Review of Systems  All other systems reviewed and are negative.     Allergies  Iohexol  Home Medications   Prior to Admission medications   Medication Sig Start Date End Date Taking? Authorizing Provider  albuterol (PROVENTIL HFA;VENTOLIN HFA) 108 (90 BASE) MCG/ACT inhaler Inhale 2 puffs into the lungs every 4 (four) hours as needed for wheezing or shortness of breath. 03/18/13   Janne Napoleon, NP  Biotin 5000 MCG CAPS Take 1 capsule by mouth daily.    Historical Provider, MD  calcium carbonate (TUMS - DOSED IN MG ELEMENTAL CALCIUM) 500 MG chewable tablet Chew 3 tablets by mouth daily as needed for indigestion or heartburn.    Historical Provider, MD  diazepam (VALIUM) 5 MG tablet Take 5 mg by  mouth every 8 (eight) hours as needed for anxiety.    Historical Provider, MD  ibuprofen (ADVIL,MOTRIN) 200 MG tablet Take 400 mg by mouth every 6 (six) hours as needed for headache.    Historical Provider, MD  metFORMIN (GLUCOPHAGE) 850 MG tablet Take 850 mg by mouth 2 (two) times daily with a meal.    Historical Provider, MD  Multiple Vitamins-Minerals (HAIR/SKIN/NAILS PO) Take 2 each by mouth daily.    Historical Provider, MD  oxyCODONE-acetaminophen (ROXICET) 5-325 MG per tablet Take 2 tablets by mouth every 4 (four) hours as needed for severe pain. 12/01/13   Claiborne Billings A. Pamala Hurry, MD  promethazine (PHENERGAN) 25 MG tablet Take 25 mg by mouth every 6 (six) hours as needed for nausea or vomiting.    Historical Provider, MD  Vitamin D, Ergocalciferol, (DRISDOL) 50000 UNITS CAPS capsule Take 50,000 Units by mouth every 7 (seven) days. Takes on Monday.    Historical Provider, MD   BP 154/95  Pulse 60  Temp(Src) 98.1 F (36.7 C) (Oral)  Resp 18  Ht 5\' 8"  (1.727 m)  Wt 295 lb (133.811 kg)  BMI 44.86 kg/m2  SpO2 98%  LMP 10/21/2013 Physical Exam  Nursing note and vitals reviewed. Constitutional: She is oriented to person, place, and time. She appears well-developed and well-nourished. No distress.  Moderately obese Caucasian female is appears to be in no acute distress.  HENT:  Head: Atraumatic.  Mouth/Throat: Oropharynx is clear and moist.  Bilateral TMs are dull but without any obvious signs of infection  Eyes: Conjunctivae and EOM are normal. Pupils are equal, round, and reactive to light.  Neck: Normal range of motion. Neck supple.  No nuchal rigidity  Cardiovascular: Normal rate and regular rhythm.   Pulmonary/Chest: Effort normal and breath sounds normal.  Abdominal: Soft. There is no tenderness.  Neurological: She is alert and oriented to person, place, and time.  Neurologic exam:  Speech clear, pupils equal round reactive to light, extraocular movements intact  Normal peripheral  visual fields Cranial nerves III through XII normal including no facial droop Follows commands, moves all extremities x4, normal strength to bilateral upper and lower extremities at all major muscle groups including grip Sensation normal to light touch  Coordination intact, no limb ataxia, finger-nose-finger normal Rapid alternating movements normal No pronator drift Gait normal   Skin: No rash noted.  Psychiatric: She has a normal mood and affect.    ED Course  Procedures (including critical care time)  10:15 AM Patient with dizziness suggestive of peripheral vertigo. Her dizziness is reproducible on exam. HINTS test was normal, doubt central cause. CBG is 106.  Has recurrent diarrhea, no recant abx use.  Will check orthostatic vital sign and labs to ensure no electrolytes abnormalities or signs of hypovolemic.  Pt does not appears dehydrated.   1:21 PM Labs are reassuring, no evidence of anemia or electrolytes imbalance. UA without evidence of urinary tract infection, pregnancy test is negative.  Dizziness improved with taking meclizine. Patient endorse a mild to moderate headache. No nuchal rigidity on exam. Tylenol given.  1:46 PM At this time pt stable for discharge.  Meclizine prescribed.  Pt to f/u with PCP for further care.  Return precaution discussed.    Labs Review Labs Reviewed  URINALYSIS, ROUTINE W REFLEX MICROSCOPIC - Abnormal; Notable for the following:    APPearance CLOUDY (*)    All other components within normal limits  CBG MONITORING, ED - Abnormal; Notable for the following:    Glucose-Capillary 106 (*)    All other components within normal limits  I-STAT CHEM 8, ED - Abnormal; Notable for the following:    Glucose, Bld 107 (*)    All other components within normal limits  CBC WITH DIFFERENTIAL  POC URINE PREG, ED    Imaging Review No results found.   EKG Interpretation None      MDM   Final diagnoses:  BPPV (benign paroxysmal positional  vertigo), unspecified laterality    BP 114/51  Pulse 51  Temp(Src) 98.1 F (36.7 C) (Oral)  Resp 18  Ht 5\' 8"  (1.727 m)  Wt 295 lb (133.811 kg)  BMI 44.86 kg/m2  SpO2 99%  LMP 10/21/2013     Domenic Moras, PA-C 01/22/14 1348

## 2014-01-22 NOTE — ED Provider Notes (Signed)
Medical screening examination/treatment/procedure(s) were performed by non-physician practitioner and as supervising physician I was immediately available for consultation/collaboration.   EKG Interpretation None        Orpah Greek, MD 01/22/14 1353

## 2014-01-22 NOTE — Progress Notes (Signed)
Appt start time: 0800 end time:  0900.  Assessment:  Patient was seen on  01/22/14 for individual diabetes education. She is working with Roxan Hockey, RD for weight management and is here for specific diabetes education. She has been taught Carb Counting already and is comfortable with it. Newly diagnosed with A1c of 6.8% this summer. She has lost 25 pounds since June, 2015 by not eating at fast food restaurants daily anymore and increasing her exercise to over an hour 3 days a week. She recently broke 3 toes, so has not been cleared to go back to gym, but states she is anxious to return as soon as she can. She has called her insurance company to inquire about coverage for a meter and plans to purchase one now.   Patient Education Plan per assessed needs and concerns is to attend individual session for Diabetes Self Management Education.  Current HbA1c: 6.8%  Preferred Learning Style:   No preference indicated   Learning Readiness:   Ready  Change in progress  MEDICATIONS: see list, Diabetes medication is Metformin  DIETARY INTAKE: 24-hr recall:  B ( AM): 2 egg veggie and ham omelet with water  Snk ( AM):  L ( PM): 1/2 [employer] salad  Snk ( PM): almonds  D ( PM): 1/2 Wendy[employer] salad or [employer] roasted chicken  Snk ( PM):    Usual physical activity: gym with 45 minutes cardio and 30 minutes weight training 3 days a week typically  Estimated energy needs: 1400 calories 158 g carbohydrates 105 g protein 39 g fat  Progress Towards Goal(s):  In progress.   Nutritional Diagnosis:  NB-1.1 Food and nutrition-related knowledge deficit As related to new diagnosis of diabetes.  As evidenced by A1c of 6.8%.    Intervention:  Nutrition counseling provided.  Discussed diabetes disease process and treatment options.  Discussed physiology of diabetes and role of obesity on insulin resistance.  Encouraged moderate weight reduction to improve glucose levels.    Reviewed education on  macronutrients on glucose levels.  Provided education on carb counting, importance of regularly scheduled meals/snacks, and meal planning. Discouraged her from skipping meals  Discussed effects of physical activity on glucose levels and long-term glucose control.  Recommended she continue with her current level of physical activity/week.  Reviewed patient medications.  Discussed role of medication on blood glucose and possible side effects  Discussed blood glucose monitoring and interpretation.  Discussed recommended target ranges and individual ranges. Instructed on use of meter and need for Rx for insurance to cover strips  Described short-term complications: hyper- and hypo-glycemia.  Discussed causes,symptoms, and treatment options.   Discussed prevention, detection, and treatment of long-term complications.  Discussed the role of prolonged elevated glucose levels on body systems.  Discussed role of stress on blood glucose levels and discussed strategies to manage psychosocial issues.  Discussed recommendations for long-term diabetes self-care.  Provided checklist for medical, dental, and emotional self-care.  Plan:  Aim for 2 Carb Choices per meal (30 grams) +/- 1 either way  Aim for 0-1 Carbs per snack if hungry  Include protein in moderation with your meals and snacks Consider reading food labels for Total Carbohydrate of foods Continue with your activity level as tolerated, GOOD JOB! Ask MD for Rx for meter and consider checking BG at alternate times per day  Other options for breakfast include supplement beverages such as Glucerna or Boost, or a Protein Bar  Teaching Method Utilized: Visual and Auditory   Handouts given  during visit include:  A1c handout with BG Goals   She already has other Diabetes handouts from previous visit  Barriers to learning/adherence to lifestyle change: none.   She started to feel light headed and nauseated during the visit. We checked her BG in  the office and it was 114 mg/dl. I asked her to lay down on the floor and we elevated her feet. After about 10 minutes she was able to get up and sit in the chair. She drank some Sprite and ate a PNB cracker and then felt comfortable leaving the office, planning to pick up food on her way to work. I recommended she have her blood pressure checked as soon as possible and perhaps see if her Metformin dose should be decreased.  Diabetes self-care support plan:   [facility] support group available  Demonstrated degree of understanding via:  Teach Back   Monitoring/Evaluation:  Dietary intake, exercise, SMBG, reading food labels, and body weight prn with me, she will follow up with Roxan Hockey, RD for continued weight loss.

## 2014-01-22 NOTE — Discharge Instructions (Signed)
Dizziness °Dizziness is a common problem. It is a feeling of unsteadiness or light-headedness. You may feel like you are about to faint. Dizziness can lead to injury if you stumble or fall. A person of any age group can suffer from dizziness, but dizziness is more common in older adults. °CAUSES  °Dizziness can be caused by many different things, including: °· Middle ear problems. °· Standing for too long. °· Infections. °· An allergic reaction. °· Aging. °· An emotional response to something, such as the sight of blood. °· Side effects of medicines. °· Tiredness. °· Problems with circulation or blood pressure. °· Excessive use of alcohol or medicines, or illegal drug use. °· Breathing too fast (hyperventilation). °· An irregular heart rhythm (arrhythmia). °· A low red blood cell count (anemia). °· Pregnancy. °· Vomiting, diarrhea, fever, or other illnesses that cause body fluid loss (dehydration). °· Diseases or conditions such as Parkinson's disease, high blood pressure (hypertension), diabetes, and thyroid problems. °· Exposure to extreme heat. °DIAGNOSIS  °Your health care provider will ask about your symptoms, perform a physical exam, and perform an electrocardiogram (ECG) to record the electrical activity of your heart. Your health care provider may also perform other heart or blood tests to determine the cause of your dizziness. These may include: °· Transthoracic echocardiogram (TTE). During echocardiography, sound waves are used to evaluate how blood flows through your heart. °· Transesophageal echocardiogram (TEE). °· Cardiac monitoring. This allows your health care provider to monitor your heart rate and rhythm in real time. °· Holter monitor. This is a portable device that records your heartbeat and can help diagnose heart arrhythmias. It allows your health care provider to track your heart activity for several days if needed. °· Stress tests by exercise or by giving medicine that makes the heart beat  faster. °TREATMENT  °Treatment of dizziness depends on the cause of your symptoms and can vary greatly. °HOME CARE INSTRUCTIONS  °· Drink enough fluids to keep your urine clear or pale yellow. This is especially important in very hot weather. In older adults, it is also important in cold weather. °· Take your medicine exactly as directed if your dizziness is caused by medicines. When taking blood pressure medicines, it is especially important to get up slowly. °¨ Rise slowly from chairs and steady yourself until you feel okay. °¨ In the morning, first sit up on the side of the bed. When you feel okay, stand slowly while holding onto something until you know your balance is fine. °· Move your legs often if you need to stand in one place for a long time. Tighten and relax your muscles in your legs while standing. °· Have someone stay with you for 1-2 days if dizziness continues to be a problem. Do this until you feel you are well enough to stay alone. Have the person call your health care provider if he or she notices changes in you that are concerning. °· Do not drive or use heavy machinery if you feel dizzy. °· Do not drink alcohol. °SEEK IMMEDIATE MEDICAL CARE IF:  °· Your dizziness or light-headedness gets worse. °· You feel nauseous or vomit. °· You have problems talking, walking, or using your arms, hands, or legs. °· You feel weak. °· You are not thinking clearly or you have trouble forming sentences. It may take a friend or family member to notice this. °· You have chest pain, abdominal pain, shortness of breath, or sweating. °· Your vision changes. °· You notice   any bleeding. °· You have side effects from medicine that seems to be getting worse rather than better. °MAKE SURE YOU:  °· Understand these instructions. °· Will watch your condition. °· Will get help right away if you are not doing well or get worse. °Document Released: 10/17/2000 Document Revised: 04/28/2013 Document Reviewed: 11/10/2010 °[employer]®  Patient Information ©2015 [employer]. This information is not intended to replace advice given to you by your health care provider. Make sure you discuss any questions you have with your health care provider. ° °Benign Positional Vertigo °Vertigo means you feel like you or your surroundings are moving when they are not. Benign positional vertigo is the most common form of vertigo. Benign means that the cause of your condition is not serious. Benign positional vertigo is more common in older adults. °CAUSES  °Benign positional vertigo is the result of an upset in the labyrinth system. This is an area in the middle ear that helps control your balance. This may be caused by a viral infection, head injury, or repetitive motion. However, often no specific cause is found. °SYMPTOMS  °Symptoms of benign positional vertigo occur when you move your head or eyes in different directions. Some of the symptoms may include: °· Loss of balance and falls. °· Vomiting. °· Blurred vision. °· Dizziness. °· Nausea. °· Involuntary eye movements (nystagmus). °DIAGNOSIS  °Benign positional vertigo is usually diagnosed by physical exam. If the specific cause of your benign positional vertigo is unknown, your caregiver may perform imaging tests, such as magnetic resonance imaging (MRI) or computed tomography (CT). °TREATMENT  °Your caregiver may recommend movements or procedures to correct the benign positional vertigo. Medicines such as meclizine, benzodiazepines, and medicines for nausea may be used to treat your symptoms. In rare cases, if your symptoms are caused by certain conditions that affect the inner ear, you may need surgery. °HOME CARE INSTRUCTIONS  °· Follow your caregiver's instructions. °· Move slowly. Do not make sudden body or head movements. °· Avoid driving. °· Avoid operating heavy machinery. °· Avoid performing any tasks that would be dangerous to you or others during a vertigo episode. °· Drink enough fluids to keep  your urine clear or pale yellow. °SEEK IMMEDIATE MEDICAL CARE IF:  °· You develop problems with walking, weakness, numbness, or using your arms, hands, or legs. °· You have difficulty speaking. °· You develop severe headaches. °· Your nausea or vomiting continues or gets worse. °· You develop visual changes. °· Your family or friends notice any behavioral changes. °· Your condition gets worse. °· You have a fever. °· You develop a stiff neck or sensitivity to light. °MAKE SURE YOU:  °· Understand these instructions. °· Will watch your condition. °· Will get help right away if you are not doing well or get worse. °Document Released: 01/29/2006 Document Revised: 07/16/2011 Document Reviewed: 01/11/2011 °Exit[employer]® Patient Information ©2015 [employer]. This information is not intended to replace advice given to you by your health care provider. Make sure you discuss any questions you have with your health care provider. ° °

## 2014-01-22 NOTE — ED Notes (Addendum)
Pt reports while at home after dinner she began to feel sick and dizzy. Pt also c/o headache. No facial droop, speech clear, equal grips, no arm drift. Pt reports that she lost 7 lbs in 1 week

## 2014-03-29 ENCOUNTER — Encounter (HOSPITAL_COMMUNITY): Payer: Self-pay

## 2014-03-29 ENCOUNTER — Emergency Department (HOSPITAL_COMMUNITY)
Admission: EM | Admit: 2014-03-29 | Discharge: 2014-03-29 | Disposition: A | Discharge status: Home / Self Care | Payer: No Typology Code available for payment source | Attending: Emergency Medicine | Admitting: Emergency Medicine

## 2014-03-29 ENCOUNTER — Emergency Department (HOSPITAL_COMMUNITY): Payer: No Typology Code available for payment source

## 2014-03-29 ENCOUNTER — Encounter (HOSPITAL_COMMUNITY): Payer: Self-pay | Admitting: Emergency Medicine

## 2014-03-29 DIAGNOSIS — E669 Obesity, unspecified: Secondary | ICD-10-CM | POA: Insufficient documentation

## 2014-03-29 DIAGNOSIS — Z9049 Acquired absence of other specified parts of digestive tract: Secondary | ICD-10-CM | POA: Insufficient documentation

## 2014-03-29 DIAGNOSIS — Z792 Long term (current) use of antibiotics: Secondary | ICD-10-CM | POA: Diagnosis not present

## 2014-03-29 DIAGNOSIS — R1012 Left upper quadrant pain: Secondary | ICD-10-CM | POA: Diagnosis not present

## 2014-03-29 DIAGNOSIS — Z3202 Encounter for pregnancy test, result negative: Secondary | ICD-10-CM | POA: Diagnosis not present

## 2014-03-29 DIAGNOSIS — F419 Anxiety disorder, unspecified: Secondary | ICD-10-CM | POA: Insufficient documentation

## 2014-03-29 DIAGNOSIS — R509 Fever, unspecified: Secondary | ICD-10-CM | POA: Insufficient documentation

## 2014-03-29 DIAGNOSIS — R300 Dysuria: Secondary | ICD-10-CM | POA: Insufficient documentation

## 2014-03-29 DIAGNOSIS — K219 Gastro-esophageal reflux disease without esophagitis: Secondary | ICD-10-CM | POA: Insufficient documentation

## 2014-03-29 DIAGNOSIS — Z8659 Personal history of other mental and behavioral disorders: Secondary | ICD-10-CM | POA: Diagnosis not present

## 2014-03-29 DIAGNOSIS — R109 Unspecified abdominal pain: Secondary | ICD-10-CM | POA: Diagnosis present

## 2014-03-29 DIAGNOSIS — E119 Type 2 diabetes mellitus without complications: Secondary | ICD-10-CM | POA: Diagnosis not present

## 2014-03-29 DIAGNOSIS — Z79899 Other long term (current) drug therapy: Secondary | ICD-10-CM | POA: Insufficient documentation

## 2014-03-29 DIAGNOSIS — Z87891 Personal history of nicotine dependence: Secondary | ICD-10-CM | POA: Diagnosis not present

## 2014-03-29 LAB — URINALYSIS, ROUTINE W REFLEX MICROSCOPIC
Bilirubin Urine: NEGATIVE
GLUCOSE, UA: NEGATIVE mg/dL
Hgb urine dipstick: NEGATIVE
KETONES UR: NEGATIVE mg/dL
Nitrite: NEGATIVE
PROTEIN: NEGATIVE mg/dL
Specific Gravity, Urine: 1.029 (ref 1.005–1.030)
UROBILINOGEN UA: 0.2 mg/dL (ref 0.0–1.0)
pH: 5.5 (ref 5.0–8.0)

## 2014-03-29 LAB — CBC
HEMATOCRIT: 39.5 % (ref 36.0–46.0)
Hemoglobin: 13.3 g/dL (ref 12.0–15.0)
MCH: 28.1 pg (ref 26.0–34.0)
MCHC: 33.7 g/dL (ref 30.0–36.0)
MCV: 83.5 fL (ref 78.0–100.0)
Platelets: 270 10*3/uL (ref 150–400)
RBC: 4.73 MIL/uL (ref 3.87–5.11)
RDW: 13.3 % (ref 11.5–15.5)
WBC: 12.2 10*3/uL — ABNORMAL HIGH (ref 4.0–10.5)

## 2014-03-29 LAB — BASIC METABOLIC PANEL
Anion gap: 11 (ref 5–15)
BUN: 11 mg/dL (ref 6–23)
CO2: 24 mEq/L (ref 19–32)
Calcium: 9.1 mg/dL (ref 8.4–10.5)
Chloride: 101 mEq/L (ref 96–112)
Creatinine, Ser: 0.77 mg/dL (ref 0.50–1.10)
GFR calc Af Amer: >90 mL/min (ref >90)
GLUCOSE: 181 mg/dL — AB (ref 70–99)
POTASSIUM: 4 meq/L (ref 3.7–5.3)
Sodium: 136 mEq/L — ABNORMAL LOW (ref 137–147)

## 2014-03-29 LAB — I-STAT TROPONIN, ED: TROPONIN I, POC: 0.01 ng/mL (ref 0.00–0.08)

## 2014-03-29 LAB — URINE MICROSCOPIC-ADD ON

## 2014-03-29 LAB — CBG MONITORING, ED: Glucose-Capillary: 114 mg/dL — ABNORMAL HIGH (ref 70–99)

## 2014-03-29 LAB — PREGNANCY, URINE: Preg Test, Ur: NEGATIVE

## 2014-03-29 MED ORDER — PHENAZOPYRIDINE HCL 200 MG PO TABS: 200.0000 mg | ORAL_TABLET | Freq: Three times a day (TID) | ORAL | Status: DC

## 2014-03-29 MED ORDER — CEPHALEXIN 500 MG PO CAPS: 500.0000 mg | ORAL_CAPSULE | Freq: Four times a day (QID) | ORAL | Status: AC

## 2014-03-29 MED ORDER — ONDANSETRON 4 MG PO TBDP
4.0000 mg | ORAL_TABLET | Freq: Once | ORAL | Status: AC
  Administered 2014-03-29: 4 mg via ORAL
  Filled 2014-03-29: qty 1

## 2014-03-29 NOTE — Discharge Instructions (Signed)
Dysuria Dysuria is the medical term for pain with urination. There are many causes for dysuria, but urinary tract infection is the most common. If a urinalysis was performed it can show that there is a urinary tract infection. A urine culture confirms that you or your child is sick. You will need to follow up with a healthcare provider because: 1. If a urine culture was done you will need to know the culture results and treatment recommendations. 2. If the urine culture was positive, you or your child will need to be put on antibiotics or know if the antibiotics prescribed are the right antibiotics for your urinary tract infection. 3. If the urine culture is negative (no urinary tract infection), then other causes may need to be explored or antibiotics need to be stopped. Today laboratory work may have been done and there does not seem to be an infection. If cultures were done they will take at least 24 to 48 hours to be completed. Today x-rays may have been taken and they read as normal. No cause can be found for the problems. The x-rays may be re-read by a radiologist and you will be contacted if additional findings are made. You or your child may have been put on medications to help with this problem until you can see your primary caregiver. If the problems get better, see your primary caregiver if the problems return. If you were given antibiotics (medications which kill germs), take all of the mediations as directed for the full course of treatment.  If laboratory work was done, you need to find the results. Leave a telephone number where you can be reached. If this is not possible, make sure you find out how you are to get test results. HOME CARE INSTRUCTIONS   Drink lots of fluids. For adults, drink eight, 8 ounce glasses of clear juice or water a day. For children, replace fluids as suggested by your caregiver.  Empty the bladder often. Avoid holding urine for long periods of time.  After a  bowel movement, women should cleanse front to back, using each tissue only once.  Empty your bladder before and after sexual intercourse.  Take all the medicine given to you until it is gone. You may feel better in a few days, but TAKE ALL MEDICINE.  Avoid caffeine, tea, alcohol and carbonated beverages, because they tend to irritate the bladder.  In men, alcohol may irritate the prostate.  Only take over-the-counter or prescription medicines for pain, discomfort, or fever as directed by your caregiver.  If your caregiver has given you a follow-up appointment, it is very important to keep that appointment. Not keeping the appointment could result in a chronic or permanent injury, pain, and disability. If there is any problem keeping the appointment, you must call back to this facility for assistance. SEEK IMMEDIATE MEDICAL CARE IF:   Back pain develops.  A fever develops.  There is nausea (feeling sick to your stomach) or vomiting (throwing up).  Problems are no better with medications or are getting worse. MAKE SURE YOU:   Understand these instructions.  Will watch your condition.  Will get help right away if you are not doing well or get worse. Document Released: 01/20/2004 Document Revised: 07/16/2011 Document Reviewed: 11/27/2007 [facility] Patient Information 2015 [place], [place]. This information is not intended to replace advice given to you by your health care provider. Make sure you discuss any questions you have with your health care provider.

## 2014-03-29 NOTE — ED Provider Notes (Signed)
CSN: [id number]     Arrival date & time 03/29/14  1505 History   First MD Initiated Contact with Patient 03/29/14 1519     Chief Complaint  Patient presents with  . Flank Pain     (Consider location/radiation/quality/duration/timing/severity/associated sxs/prior Treatment) HPI Patient is an otherwise healthy 33 year old female with a history of diabetes who presents with dysuria and left flank pain. She says that for the past week she has had dysuria, and suprapubic discomfort. She also reports that over the past 3 days she's developed left-sided back pain. She said she took her temperature earlier today at work, it was 100.9. She saw her PCP earlier in the week, who did a urine dipstick and said there was no evidence of urinary tract infection. Says her symptoms gotten worse since that time. She denies any abdominal pain, but has felt mildly nauseated today, but has not vomited.  Past Medical History  Diagnosis Date  . Obesity   . PCOS (polycystic ovarian syndrome)   . Insulin resistance   . Hyperlipidemia   . PONV (postoperative nausea and vomiting)   . Anxiety     panic attacks  . GERD (gastroesophageal reflux disease)    Past Surgical History  Procedure Laterality Date  . Cholecystectomy    . Dilation and curettage of uterus    . Dilatation & currettage/hysteroscopy with resectocope N/A 10/17/2012    Procedure: DILATATION & CURETTAGE/HYSTEROSCOPY WITH RESECTOCOPE; Possible Polypectomy, Possible Resectoscopic Myomectomy.;  Surgeon: Floyce Stakes. Pamala Hurry, MD;  Location: [facility];  Service: Gynecology;  Laterality: N/A;  1 hr.  . Intrauterine device (iud) insertion N/A 10/17/2012    Procedure: INTRAUTERINE DEVICE (IUD) INSERTION; Mirena;  Surgeon: Floyce Stakes. Pamala Hurry, MD;  Location: [facility];  Service: Gynecology;  Laterality: N/A;  . Hysteroscopy w/d&c N/A 12/01/2013    Procedure: IUD Removal;  Surgeon: Claiborne Billings A. Pamala Hurry, MD;  Location: [facility];  Service: Gynecology;  Laterality: N/A;  90 min.    Family History  Problem Relation Age of Onset  . Diabetes Father   . Diabetes Maternal Grandmother   . Diabetes Maternal Grandfather   . Diabetes Paternal Grandmother   . Diabetes Paternal Grandfather   . Diabetes Other   . Obesity Other   . Sleep apnea Other   . Hyperlipidemia Other   . Hypertension Other   . Cancer Other   . Heart disease Other    History  Substance Use Topics  . Smoking status: Former Smoker    Types: Cigarettes  . Smokeless tobacco: Not on file  . Alcohol Use: No   OB History    No data available     Review of Systems  Constitutional: Positive for fever and chills.  Gastrointestinal: Positive for nausea. Negative for abdominal pain.  Genitourinary: Positive for dysuria and flank pain.  All other systems reviewed and are negative.     Allergies  Iohexol  Home Medications   Prior to Admission medications   Medication Sig Start Date End Date Taking? Authorizing Provider  Biotin 5000 MCG CAPS Take 1 capsule by mouth daily.   Yes Historical Provider, MD  calcium carbonate (TUMS - DOSED IN MG ELEMENTAL CALCIUM) 500 MG chewable tablet Chew 3 tablets by mouth daily as needed for indigestion or heartburn.   Yes Historical Provider, MD  clindamycin (CLINDAGEL) 1 % gel Apply 1 application topically 2 (two) times daily.   Yes Historical Provider, MD  diazepam (VALIUM) 5 MG tablet Take 5 mg by mouth every 8 (eight)  hours as needed for anxiety.   Yes Historical Provider, MD  DM-Doxylamine-Acetaminophen (NYQUIL COLD & FLU PO) Take 10 mLs by mouth every 6 (six) hours as needed (for cold).   Yes Historical Provider, MD  hydroquinone 4 % cream Apply 1 application topically 2 (two) times daily. 03/11/14  Yes Historical Provider, MD  ibuprofen (ADVIL,MOTRIN) 200 MG tablet Take 400 mg by mouth every 6 (six) hours as needed for headache.   Yes Historical Provider, MD  metFORMIN (GLUCOPHAGE) 850 MG tablet Take 850 mg by mouth 2 (two) times daily with a meal.   Yes  Historical Provider, MD  Multiple Vitamins-Minerals (AIRBORNE) CHEW Chew 3 each by mouth every morning.   Yes Historical Provider, MD  promethazine (PHENERGAN) 25 MG tablet Take 25 mg by mouth daily as needed for nausea or vomiting.    Yes Historical Provider, MD  ranitidine (ZANTAC) 150 MG tablet Take 150 mg by mouth daily.   Yes Historical Provider, MD  Vitamin D, Ergocalciferol, (DRISDOL) 50000 UNITS CAPS capsule Take 50,000 Units by mouth every 7 (seven) days. Takes on Monday.   Yes Historical Provider, MD  albuterol (PROVENTIL HFA;VENTOLIN HFA) 108 (90 BASE) MCG/ACT inhaler Inhale 2 puffs into the lungs every 4 (four) hours as needed for wheezing or shortness of breath. 03/18/13   Janne Napoleon, NP  cephALEXin (KEFLEX) 500 MG capsule Take 1 capsule (500 mg total) by mouth 4 (four) times daily. 03/29/14 04/04/14  Leata Mouse, MD  meclizine (ANTIVERT) 50 MG tablet Take 1 tablet (50 mg total) by mouth 3 (three) times daily as needed for dizziness. 01/22/14   Domenic Moras, PA-C  phenazopyridine (PYRIDIUM) 200 MG tablet Take 1 tablet (200 mg total) by mouth 3 (three) times daily. 03/29/14   Leata Mouse, MD   BP 110/56 mmHg  Pulse 65  Temp(Src) 98.2 F (36.8 C)  Resp 15  Ht 5\' 9"  (1.753 m)  Wt 293 lb (132.904 kg)  BMI 43.25 kg/m2  SpO2 100%  LMP  (LMP Unknown) Physical Exam  Constitutional: She is oriented to person, place, and time. She appears well-developed and well-nourished. No distress.  HENT:  Head: Normocephalic and atraumatic.  Eyes: Conjunctivae and EOM are normal. Pupils are equal, round, and reactive to light.  Neck: Normal range of motion.  Cardiovascular: Normal rate, regular rhythm and normal heart sounds.   Pulmonary/Chest: Effort normal and breath sounds normal. No respiratory distress. She has no wheezes.  Abdominal: Bowel sounds are normal. There is no tenderness. There is CVA tenderness. There is no tenderness at McBurney's point (left) and negative Murphy's sign.   Musculoskeletal: Normal range of motion. She exhibits no edema.  Neurological: She is alert and oriented to person, place, and time.  Skin: Skin is warm and dry.  Psychiatric: She has a normal mood and affect.  Nursing note and vitals reviewed.   ED Course  Procedures (including critical care time) Labs Review Labs Reviewed  URINALYSIS, ROUTINE W REFLEX MICROSCOPIC - Abnormal; Notable for the following:    Color, Urine AMBER (*)    Leukocytes, UA SMALL (*)    All other components within normal limits  URINE MICROSCOPIC-ADD ON - Abnormal; Notable for the following:    Bacteria, UA FEW (*)    All other components within normal limits  CBG MONITORING, ED - Abnormal; Notable for the following:    Glucose-Capillary 114 (*)    All other components within normal limits  URINE CULTURE  PREGNANCY, URINE    Imaging Review  No results found.   EKG Interpretation None      MDM   Final diagnoses:  Dysuria    33 year old female presenting with dysuria and flank pain. Does have reported history of fever prior to arrival, but also has symptoms consistent with viral gastroenteritis, nausea vomiting and diarrhea, though very mild and only present for the past 24 hours. 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 No abdominal tenderness to palpation, nontoxic and well-appearing. UA notable only for small leukocytes and few bacteria.   no blood to suggest the patient has a kidney stone, and history not consistent with renal colic. Discussed options for further workup with the patient, however through sure decision-making was deemed not necessary to pursue any further workup for her mild nausea vomiting and diarrhea at this point, likely viral. Her dysuria, may represent simple cystitis, however I sent the urine for culture and gave the patient antibiotics to take should the culture come back positive.  Leata Mouse, MD 03/29/14 [place],  MD 04/05/14 [id number]

## 2014-03-29 NOTE — ED Notes (Signed)
Pt presents with right sided flank pain for the past week, admits to seeing PCP and having urinalysis completed which came back negative- pt concerned because they did not culture her urine.  Admits to some nausea.  Denies vaginal symptoms.

## 2014-03-30 ENCOUNTER — Emergency Department (HOSPITAL_COMMUNITY)
Admission: EM | Admit: 2014-03-30 | Discharge: 2014-03-30 | Disposition: A | Discharge status: Home / Self Care | Payer: No Typology Code available for payment source | Attending: Emergency Medicine | Admitting: Emergency Medicine

## 2014-03-30 ENCOUNTER — Emergency Department (HOSPITAL_COMMUNITY): Payer: No Typology Code available for payment source

## 2014-03-30 DIAGNOSIS — R079 Chest pain, unspecified: Secondary | ICD-10-CM

## 2014-03-30 DIAGNOSIS — R1012 Left upper quadrant pain: Secondary | ICD-10-CM

## 2014-03-30 LAB — GI PATHOGEN PANEL BY PCR, STOOL
C DIFFICILE TOXIN A/B: NEGATIVE
CRYPTOSPORIDIUM BY PCR: NEGATIVE
Campylobacter by PCR: NEGATIVE
E COLI (ETEC) LT/ST: NEGATIVE
E COLI 0157 BY PCR: NEGATIVE
E coli (STEC): NEGATIVE
G LAMBLIA BY PCR: NEGATIVE
Norovirus GI/GII: NEGATIVE
Rotavirus A by PCR: NEGATIVE
SHIGELLA BY PCR: NEGATIVE
Salmonella by PCR: NEGATIVE

## 2014-03-30 MED ORDER — ONDANSETRON HCL 4 MG/2ML IJ SOLN
4.0000 mg | Freq: Once | INTRAMUSCULAR | Status: DC
  Filled 2014-03-30: qty 2

## 2014-03-30 MED ORDER — DICYCLOMINE HCL 10 MG/ML IM SOLN
20.0000 mg | Freq: Once | INTRAMUSCULAR | Status: AC
  Administered 2014-03-30: 20 mg via INTRAMUSCULAR
  Filled 2014-03-30: qty 2

## 2014-03-30 MED ORDER — ONDANSETRON 4 MG PO TBDP
8.0000 mg | ORAL_TABLET | Freq: Once | ORAL | Status: AC
  Administered 2014-03-30: 8 mg via ORAL
  Filled 2014-03-30: qty 2

## 2014-03-30 MED ORDER — DICYCLOMINE HCL 20 MG PO TABS: 20.0000 mg | ORAL_TABLET | Freq: Four times a day (QID) | ORAL | Status: DC | PRN

## 2014-03-30 MED ORDER — MORPHINE SULFATE 4 MG/ML IJ SOLN
4.0000 mg | Freq: Once | INTRAMUSCULAR | Status: DC
  Filled 2014-03-30: qty 1

## 2014-03-30 NOTE — ED Notes (Signed)
Pt monitored by pulse ox, bp cuff, and 12-lead. 

## 2014-03-30 NOTE — ED Provider Notes (Signed)
CSN: [id number]     Arrival date & time 03/29/14  2227 History   First MD Initiated Contact with Patient 03/30/14 0038     Chief Complaint  Patient presents with  . Chest Pain  . Back Pain     (Consider location/radiation/quality/duration/timing/severity/associated sxs/prior Treatment) HPI 33 year old female returns to the emergency department with complaint of worsening abdominal pain.  Patient was seen earlier in the afternoon for left flank pain and thought she may have a urinary tract infection.  Patient reports she began to have dysuria week ago.  She works in a pediatrician's office, and at the office.  They did a urine culture.  She reports that it has grown bacteria on agar twice, but has not been speciated.  At her primary care doctors, however, her urine dip has been negative.  On Saturday she developed left flank pain.  Patient was seen earlier, given prescription for Keflex and urine was sent for culture.  She has not yet started the antibiotics, as she is waiting for culture.  She reports she has had intermittent fevers 100.9.  She also reports that she has had 3 loose stools a day for the past week.  Her sister was diagnosed with C. difficile about 2 weeks ago.  She has not recently been on any antibiotics.  She reports starting around 10 PM she developed sharp, crampy pain on the left side of her abdomen going up into her left chest and left flank.  She has been taking Tylenol without improvement in her symptoms. Past Medical History  Diagnosis Date  . Obesity   . PCOS (polycystic ovarian syndrome)   . Insulin resistance   . Hyperlipidemia   . PONV (postoperative nausea and vomiting)   . Anxiety     panic attacks  . GERD (gastroesophageal reflux disease)    Past Surgical History  Procedure Laterality Date  . Cholecystectomy    . Dilation and curettage of uterus    . Dilatation & currettage/hysteroscopy with resectocope N/A 10/17/2012    Procedure: DILATATION &  CURETTAGE/HYSTEROSCOPY WITH RESECTOCOPE; Possible Polypectomy, Possible Resectoscopic Myomectomy.;  Surgeon: Floyce Stakes. Pamala Hurry, MD;  Location: [facility];  Service: Gynecology;  Laterality: N/A;  1 hr.  . Intrauterine device (iud) insertion N/A 10/17/2012    Procedure: INTRAUTERINE DEVICE (IUD) INSERTION; Mirena;  Surgeon: Floyce Stakes. Pamala Hurry, MD;  Location: [facility];  Service: Gynecology;  Laterality: N/A;  . Hysteroscopy w/d&c N/A 12/01/2013    Procedure: IUD Removal;  Surgeon: Claiborne Billings A. Pamala Hurry, MD;  Location: [facility];  Service: Gynecology;  Laterality: N/A;  90 min.   Family History  Problem Relation Age of Onset  . Diabetes Father   . Diabetes Maternal Grandmother   . Diabetes Maternal Grandfather   . Diabetes Paternal Grandmother   . Diabetes Paternal Grandfather   . Diabetes Other   . Obesity Other   . Sleep apnea Other   . Hyperlipidemia Other   . Hypertension Other   . Cancer Other   . Heart disease Other    History  Substance Use Topics  . Smoking status: Former Smoker    Types: Cigarettes  . Smokeless tobacco: Not on file  . Alcohol Use: No   OB History    No data available     Review of Systems   See History of Present Illness; otherwise all other systems are reviewed and negative  Allergies  Iohexol  Home Medications   Prior to Admission medications   Medication Sig Start  Date End Date Taking? Authorizing Provider  albuterol (PROVENTIL HFA;VENTOLIN HFA) 108 (90 BASE) MCG/ACT inhaler Inhale 2 puffs into the lungs every 4 (four) hours as needed for wheezing or shortness of breath. 03/18/13  Yes Janne Napoleon, NP  Biotin 5000 MCG CAPS Take 1 capsule by mouth daily.   Yes Historical Provider, MD  calcium carbonate (TUMS - DOSED IN MG ELEMENTAL CALCIUM) 500 MG chewable tablet Chew 3 tablets by mouth daily as needed for indigestion or heartburn.   Yes Historical Provider, MD  clindamycin (CLINDAGEL) 1 % gel Apply 1 application topically 2 (two) times daily.   Yes Historical  Provider, MD  diazepam (VALIUM) 5 MG tablet Take 5 mg by mouth every 8 (eight) hours as needed for anxiety.   Yes Historical Provider, MD  DM-Doxylamine-Acetaminophen (NYQUIL COLD & FLU PO) Take 10 mLs by mouth every 6 (six) hours as needed (for cold).   Yes Historical Provider, MD  hydroquinone 4 % cream Apply 1 application topically 2 (two) times daily. 03/11/14  Yes Historical Provider, MD  ibuprofen (ADVIL,MOTRIN) 200 MG tablet Take 400 mg by mouth every 6 (six) hours as needed for headache.   Yes Historical Provider, MD  meclizine (ANTIVERT) 50 MG tablet Take 1 tablet (50 mg total) by mouth 3 (three) times daily as needed for dizziness. 01/22/14  Yes Domenic Moras, PA-C  metFORMIN (GLUCOPHAGE) 850 MG tablet Take 850 mg by mouth 2 (two) times daily with a meal.   Yes Historical Provider, MD  Multiple Vitamins-Minerals (AIRBORNE) CHEW Chew 3 each by mouth every morning.   Yes Historical Provider, MD  phenazopyridine (PYRIDIUM) 200 MG tablet Take 1 tablet (200 mg total) by mouth 3 (three) times daily. 03/29/14  Yes Leata Mouse, MD  promethazine (PHENERGAN) 25 MG tablet Take 25 mg by mouth daily as needed for nausea or vomiting.    Yes Historical Provider, MD  ranitidine (ZANTAC) 150 MG tablet Take 150 mg by mouth daily.   Yes Historical Provider, MD  Vitamin D, Ergocalciferol, (DRISDOL) 50000 UNITS CAPS capsule Take 50,000 Units by mouth every 7 (seven) days. Takes on Monday.   Yes Historical Provider, MD  cephALEXin (KEFLEX) 500 MG capsule Take 1 capsule (500 mg total) by mouth 4 (four) times daily. 03/29/14 04/04/14  Leata Mouse, MD   BP 111/45 mmHg  Pulse 76  Temp(Src) 98.1 F (36.7 C) (Oral)  Resp 19  Ht 5\' 9"  (1.753 m)  Wt 293 lb (132.904 kg)  BMI 43.25 kg/m2  SpO2 98%  LMP  (LMP Unknown) Physical Exam  Constitutional: She is oriented to person, place, and time. She appears well-developed and well-nourished.  HENT:  Head: Normocephalic and atraumatic.  Nose: Nose normal.  Mouth/Throat:  Oropharynx is clear and moist.  Eyes: Conjunctivae and EOM are normal. Pupils are equal, round, and reactive to light.  Neck: Normal range of motion. Neck supple. No JVD present. No tracheal deviation present. No thyromegaly present.  Cardiovascular: Normal rate, regular rhythm, normal heart sounds and intact distal pulses.  Exam reveals no gallop and no friction rub.   No murmur heard. Pulmonary/Chest: Effort normal and breath sounds normal. No stridor. No respiratory distress. She has no wheezes. She has no rales. She exhibits no tenderness.  Abdominal: Soft. Bowel sounds are normal. She exhibits no distension and no mass. There is tenderness (diffuse tenderness worse in left upper quadrant). There is no rebound and no guarding.  Musculoskeletal: Normal range of motion. She exhibits no edema or tenderness.  Lymphadenopathy:  She has no cervical adenopathy.  Neurological: She is alert and oriented to person, place, and time. She displays normal reflexes. She exhibits normal muscle tone. Coordination normal.  Skin: Skin is warm and dry. No rash noted. No erythema. No pallor.  Psychiatric: She has a normal mood and affect. Her behavior is normal. Judgment and thought content normal.  Nursing note and vitals reviewed.   ED Course  Procedures (including critical care time) Labs Review Labs Reviewed  CBC - Abnormal; Notable for the following:    WBC 12.2 (*)    All other components within normal limits  BASIC METABOLIC PANEL - Abnormal; Notable for the following:    Sodium 136 (*)    Glucose, Bld 181 (*)    All other components within normal limits  GI PATHOGEN PANEL BY PCR, STOOL  I-STAT TROPOININ, ED    Imaging Review Ct Abdomen Pelvis Wo Contrast  03/30/2014   CLINICAL DATA:  Right-sided flank pain for 1 week.  EXAM: CT ABDOMEN AND PELVIS WITHOUT CONTRAST  TECHNIQUE: Multidetector CT imaging of the abdomen and pelvis was performed following the standard protocol without IV  contrast.  COMPARISON:  11/24/2013  FINDINGS: BODY WALL: Unremarkable.  LOWER CHEST: Unremarkable.  ABDOMEN/PELVIS:  Liver: No focal abnormality.  Biliary: Cholecystectomy  Pancreas: Unremarkable.  Spleen: Unremarkable.  Adrenals: Unremarkable.  Kidneys and ureters: No hydronephrosis or stone.  Bladder: Unremarkable.  Reproductive: Interval removal of a malpositioned IUD. The right ovary is larger than the left with suggestion of a 3 cm cyst or dominant follicle (best visualized on coronal reformats).  Bowel: No obstruction. Negative appendix.  Retroperitoneum: No mass or adenopathy.  Peritoneum: No ascites or pneumoperitoneum.  Vascular: No acute abnormality.  OSSEOUS: No acute abnormalities.  IMPRESSION: No acute intra-abdominal findings.   Electronically Signed   By: Jorje Guild M.D.   On: 03/30/2014 03:51   Dg Chest 2 View  03/29/2014   CLINICAL DATA:  Left-sided chest pain.  EXAM: CHEST  2 VIEW  COMPARISON:  08/28/2013  FINDINGS: Normal heart size. Negative aortic/hilar contours, stable from prior. There is no edema, consolidation, effusion, or pneumothorax. Cholecystectomy changes.  IMPRESSION: No active cardiopulmonary disease.   Electronically Signed   By: Jorje Guild M.D.   On: 03/29/2014 23:37     EKG Interpretation   Date/Time:  Monday March 29 2014 22:37:04 EST Ventricular Rate:  93 PR Interval:  152 QRS Duration: 94 QT Interval:  354 QTC Calculation: 440 R Axis:   73 Text Interpretation:  Normal sinus rhythm Incomplete right bundle branch  block Nonspecific T wave abnormality Abnormal ECG No significant change  since last tracing Confirmed by Yadier Bramhall  MD, Nury Nebergall ([id number]) on 03/30/2014  12:24:57 AM      MDM   Final diagnoses:  Abdominal pain, LUQ    33 year old female with worsening abdominal pain.  Urine results reviewed from earlier in the day, no clear-cut signs of infection.  Given persistent diarrhea and worsening abdominal pain to [place]'s colitis or  diverticulitis.  Plan for CT abdomen pelvis.    Kalman Drape, MD 03/30/14 [id number]

## 2014-03-30 NOTE — ED Notes (Signed)
Pt to ED c/o L sided flank pain. Was seen in ED earlier today for similar symptoms. Reports burning with urination and concerns of kidney infection. Pt also reports pain radiating from bilateral flank into shoulder blades and into chest. Pain is intermittent and associated with nausea.

## 2014-03-30 NOTE — ED Notes (Signed)
Pt also c/o diarrhea x 1 week. States sister was recently diagnosed with c-diff

## 2014-03-30 NOTE — Discharge Instructions (Signed)
Your workup today has not shown a specific cause for your symptoms.  Your stool was sent for GI pathogens, you will be contacted if it becomes positive.  Return to the [facility] for her worsening condition or new concerning symptoms.   Abdominal Pain Many things can cause abdominal pain. Usually, abdominal pain is not caused by a disease and will improve without treatment. It can often be observed and treated at home. Your health care provider will do a physical exam and possibly order blood tests and X-rays to help determine the seriousness of your pain. However, in many cases, more time must pass before a clear cause of the pain can be found. Before that point, your health care provider may not know if you need more testing or further treatment. HOME CARE INSTRUCTIONS  Monitor your abdominal pain for any changes. The following actions may help to alleviate any discomfort you are experiencing:  Only take over-the-counter or prescription medicines as directed by your health care provider.  Do not take laxatives unless directed to do so by your health care provider.  Try a clear liquid diet (broth, tea, or water) as directed by your health care provider. Slowly move to a bland diet as tolerated. SEEK MEDICAL CARE IF:  You have unexplained abdominal pain.  You have abdominal pain associated with nausea or diarrhea.  You have pain when you urinate or have a bowel movement.  You experience abdominal pain that wakes you in the night.  You have abdominal pain that is worsened or improved by eating food.  You have abdominal pain that is worsened with eating fatty foods.  You have a fever. SEEK IMMEDIATE MEDICAL CARE IF:   Your pain does not go away within 2 hours.  You keep throwing up (vomiting).  Your pain is felt only in portions of the abdomen, such as the right side or the left lower portion of the abdomen.  You pass bloody or black tarry stools. MAKE SURE YOU:  Understand these  instructions.   Will watch your condition.   Will get help right away if you are not doing well or get worse.  Document Released: 01/31/2005 Document Revised: 04/28/2013 Document Reviewed: 12/31/2012 [facility] Patient Information 2015 [place], [place]. This information is not intended to replace advice given to you by your health care provider. Make sure you discuss any questions you have with your health care provider.

## 2014-03-31 LAB — URINE CULTURE

## 2014-04-20 ENCOUNTER — Telehealth (HOSPITAL_BASED_OUTPATIENT_CLINIC_OR_DEPARTMENT_OTHER): Payer: Self-pay | Admitting: Emergency Medicine

## 2014-10-16 ENCOUNTER — Emergency Department (HOSPITAL_COMMUNITY)
Admission: EM | Admit: 2014-10-16 | Discharge: 2014-10-16 | Disposition: A | Discharge status: Home / Self Care | Payer: Managed Care, Other (non HMO) | Attending: Emergency Medicine | Admitting: Emergency Medicine

## 2014-10-16 ENCOUNTER — Encounter (HOSPITAL_COMMUNITY): Payer: Self-pay | Admitting: Emergency Medicine

## 2014-10-16 ENCOUNTER — Emergency Department (HOSPITAL_COMMUNITY): Payer: Managed Care, Other (non HMO)

## 2014-10-16 DIAGNOSIS — R519 Headache, unspecified: Secondary | ICD-10-CM

## 2014-10-16 DIAGNOSIS — G43009 Migraine without aura, not intractable, without status migrainosus: Secondary | ICD-10-CM

## 2014-10-16 DIAGNOSIS — Z79899 Other long term (current) drug therapy: Secondary | ICD-10-CM | POA: Insufficient documentation

## 2014-10-16 DIAGNOSIS — R51 Headache: Secondary | ICD-10-CM | POA: Diagnosis present

## 2014-10-16 DIAGNOSIS — F419 Anxiety disorder, unspecified: Secondary | ICD-10-CM | POA: Insufficient documentation

## 2014-10-16 DIAGNOSIS — Z87891 Personal history of nicotine dependence: Secondary | ICD-10-CM | POA: Diagnosis not present

## 2014-10-16 DIAGNOSIS — E669 Obesity, unspecified: Secondary | ICD-10-CM | POA: Insufficient documentation

## 2014-10-16 DIAGNOSIS — G43909 Migraine, unspecified, not intractable, without status migrainosus: Secondary | ICD-10-CM | POA: Diagnosis not present

## 2014-10-16 DIAGNOSIS — K219 Gastro-esophageal reflux disease without esophagitis: Secondary | ICD-10-CM | POA: Insufficient documentation

## 2014-10-16 DIAGNOSIS — R200 Anesthesia of skin: Secondary | ICD-10-CM | POA: Diagnosis not present

## 2014-10-16 LAB — TROPONIN I: Troponin I: <0.03 ng/mL (ref <0.031)

## 2014-10-16 MED ORDER — METOCLOPRAMIDE HCL 5 MG/ML IJ SOLN
10.0000 mg | Freq: Once | INTRAMUSCULAR | Status: AC
  Administered 2014-10-16: 10 mg via INTRAMUSCULAR
  Filled 2014-10-16: qty 2

## 2014-10-16 MED ORDER — ACETAMINOPHEN 325 MG PO TABS: 650.0000 mg | ORAL_TABLET | Freq: Once | ORAL | Status: DC

## 2014-10-16 MED ORDER — KETOROLAC TROMETHAMINE 30 MG/ML IJ SOLN: 30.0000 mg | Freq: Once | INTRAMUSCULAR | Status: DC

## 2014-10-16 MED ORDER — SUMATRIPTAN SUCCINATE 6 MG/0.5ML ~~LOC~~ SOLN
6.0000 mg | Freq: Once | SUBCUTANEOUS | Status: AC
  Administered 2014-10-16: 6 mg via SUBCUTANEOUS
  Filled 2014-10-16: qty 0.5

## 2014-10-16 MED ORDER — SUMATRIPTAN SUCCINATE 100 MG PO TABS: 100.0000 mg | ORAL_TABLET | ORAL | Status: DC | PRN

## 2014-10-16 MED ORDER — TRAMADOL HCL 50 MG PO TABS: 50.0000 mg | ORAL_TABLET | Freq: Four times a day (QID) | ORAL | Status: DC | PRN

## 2014-10-16 NOTE — ED Notes (Signed)
Pt reports that she is not on any anxiety medications, is scheduled to start new medications at next appointment.  Hx of anxiety issues including panic attacks.  Has had two panic attacks in the past two weeks.

## 2014-10-16 NOTE — Discharge Instructions (Signed)
It was our pleasure to provide your ER care today - we hope that you feel better.  Rest. Drink plenty of fluids.  You may take imitrex as need for migraine - make sure to only take maximum of 2 tablets per 24 hour period.  You may also try ultram as need for pain - no driving when taking.  Follow up with primary care doctor in coming week.  Return to ER if worse, new symptoms, fevers, worsening or intractable headache, recurrent or persistent chest pain, trouble breathing, other concern.      Migraine Headache A migraine headache is an intense, throbbing pain on one or both sides of your head. A migraine can last for 30 minutes to several hours. CAUSES  The exact cause of a migraine headache is not always known. However, a migraine may be caused when nerves in the brain become irritated and release chemicals that cause inflammation. This causes pain. Certain things may also trigger migraines, such as:  Alcohol.  Smoking.  Stress.  Menstruation.  Aged cheeses.  Foods or drinks that contain nitrates, glutamate, aspartame, or tyramine.  Lack of sleep.  Chocolate.  Caffeine.  Hunger.  Physical exertion.  Fatigue.  Medicines used to treat chest pain (nitroglycerine), birth control pills, estrogen, and some blood pressure medicines. SIGNS AND SYMPTOMS  Pain on one or both sides of your head.  Pulsating or throbbing pain.  Severe pain that prevents daily activities.  Pain that is aggravated by any physical activity.  Nausea, vomiting, or both.  Dizziness.  Pain with exposure to bright lights, loud noises, or activity.  General sensitivity to bright lights, loud noises, or smells. Before you get a migraine, you may get warning signs that a migraine is coming (aura). An aura may include:  Seeing flashing lights.  Seeing bright spots, halos, or zigzag lines.  Having tunnel vision or blurred vision.  Having feelings of numbness or tingling.  Having  trouble talking.  Having muscle weakness. DIAGNOSIS  A migraine headache is often diagnosed based on:  Symptoms.  Physical exam.  A CT scan or MRI of your head. These imaging tests cannot diagnose migraines, but they can help rule out other causes of headaches. TREATMENT Medicines may be given for pain and nausea. Medicines can also be given to help prevent recurrent migraines.  HOME CARE INSTRUCTIONS  Only take over-the-counter or prescription medicines for pain or discomfort as directed by your health care provider. The use of long-term narcotics is not recommended.  Lie down in a dark, quiet room when you have a migraine.  Keep a journal to find out what may trigger your migraine headaches. For example, write down:  What you eat and drink.  How much sleep you get.  Any change to your diet or medicines.  Limit alcohol consumption.  Quit smoking if you smoke.  Get 7-9 hours of sleep, or as recommended by your health care provider.  Limit stress.  Keep lights dim if bright lights bother you and make your migraines worse. SEEK IMMEDIATE MEDICAL CARE IF:   Your migraine becomes severe.  You have a fever.  You have a stiff neck.  You have vision loss.  You have muscular weakness or loss of muscle control.  You start losing your balance or have trouble walking.  You feel faint or pass out.  You have severe symptoms that are different from your first symptoms. MAKE SURE YOU:   Understand these instructions.  Will watch your condition.  Will get help right away if you are not doing well or get worse. Document Released: 04/23/2005 Document Revised: 09/07/2013 Document Reviewed: 12/29/2012 [facility] Patient Information 2015 [place], [place]. This information is not intended to replace advice given to you by your health care provider. Make sure you discuss any questions you have with your health care provider.

## 2014-10-16 NOTE — ED Notes (Addendum)
Pt very tearful, reports many stressors at home including moving, having to find a new home, being away from family, son coming to live w/ him.  Reports stresses have continued to increase.  Able to be redirected and calmed.  Will continue to monitor.

## 2014-10-16 NOTE — ED Notes (Signed)
Pt reports that headache pain is "all but gone," and that she is feeling much better.

## 2014-10-16 NOTE — ED Notes (Signed)
MD at bedside. 

## 2014-10-16 NOTE — ED Provider Notes (Addendum)
CSN: [id number]     Arrival date & time 10/16/14  1018 History   First MD Initiated Contact with Patient 10/16/14 1102     Chief Complaint  Patient presents with  . Headache  . Dizziness  . Nausea     (Consider location/radiation/quality/duration/timing/severity/associated sxs/prior Treatment) Patient is a 34 y.o. female presenting with headaches and dizziness. The history is provided by the patient.  Headache Associated symptoms: dizziness and numbness   Associated symptoms: no abdominal pain, no back pain, no congestion, no eye pain, no fever, no nausea, no neck pain, no neck stiffness, no sinus pressure, no vomiting and no weakness   Dizziness Associated symptoms: headaches   Associated symptoms: no chest pain, no nausea, no shortness of breath, no vomiting and no weakness   Patient c/o onset left frontal headache approximately 3 hours ago. Was moderate at onset, dull to throbbing, constant. Slowly worse since onset. C/o tingling sensation in fingers. No weakness or loss of normal functional ability. No neck pain or stiffness. Denies eye pain or change in vision. +nausea. No sinus congestion or drainage. No uri c/o. No recent head injury, trauma or fall. No syncope. No fever or chills. w headache, no specific exacerbating or alleviating factors.  Denies any other recent headache. ?remote hx migraine approximately 6-7 yrs ago. Has taken no meds for headache.      Past Medical History  Diagnosis Date  . Obesity   . PCOS (polycystic ovarian syndrome)   . Insulin resistance   . Hyperlipidemia   . PONV (postoperative nausea and vomiting)   . Anxiety     panic attacks  . GERD (gastroesophageal reflux disease)    Past Surgical History  Procedure Laterality Date  . Cholecystectomy    . Dilation and curettage of uterus    . Dilatation & currettage/hysteroscopy with resectocope N/A 10/17/2012    Procedure: DILATATION & CURETTAGE/HYSTEROSCOPY WITH RESECTOCOPE; Possible Polypectomy,  Possible Resectoscopic Myomectomy.;  Surgeon: Floyce Stakes. Pamala Hurry, MD;  Location: [facility];  Service: Gynecology;  Laterality: N/A;  1 hr.  . Intrauterine device (iud) insertion N/A 10/17/2012    Procedure: INTRAUTERINE DEVICE (IUD) INSERTION; Mirena;  Surgeon: Floyce Stakes. Pamala Hurry, MD;  Location: [facility];  Service: Gynecology;  Laterality: N/A;  . Hysteroscopy w/d&c N/A 12/01/2013    Procedure: IUD Removal;  Surgeon: Claiborne Billings A. Pamala Hurry, MD;  Location: [facility];  Service: Gynecology;  Laterality: N/A;  90 min.   Family History  Problem Relation Age of Onset  . Diabetes Father   . Diabetes Maternal Grandmother   . Diabetes Maternal Grandfather   . Diabetes Paternal Grandmother   . Diabetes Paternal Grandfather   . Diabetes Other   . Obesity Other   . Sleep apnea Other   . Hyperlipidemia Other   . Hypertension Other   . Cancer Other   . Heart disease Other    History  Substance Use Topics  . Smoking status: Former Smoker    Types: Cigarettes  . Smokeless tobacco: Not on file  . Alcohol Use: No   OB History    No data available     Review of Systems  Constitutional: Negative for fever and chills.  HENT: Negative for congestion and sinus pressure.   Eyes: Negative for pain and visual disturbance.  Respiratory: Negative for shortness of breath.   Cardiovascular: Negative for chest pain.  Gastrointestinal: Negative for nausea, vomiting and abdominal pain.  Endocrine: Negative for polyuria.  Genitourinary: Negative for dysuria and flank pain.  Musculoskeletal: Negative for back pain, neck pain and neck stiffness.  Skin: Negative for rash.  Neurological: Positive for dizziness, numbness and headaches. Negative for syncope and weakness.  Hematological: Does not bruise/bleed easily.  Psychiatric/Behavioral: Negative for confusion.      Allergies  Iohexol  Home Medications   Prior to Admission medications   Medication Sig Start Date End Date Taking? Authorizing Provider  Biotin 5000  MCG CAPS Take 1 capsule by mouth daily.    Historical Provider, MD  calcium carbonate (TUMS - DOSED IN MG ELEMENTAL CALCIUM) 500 MG chewable tablet Chew 3 tablets by mouth daily as needed for indigestion or heartburn.    Historical Provider, MD  clindamycin (CLINDAGEL) 1 % gel Apply 1 application topically 2 (two) times daily.    Historical Provider, MD  diazepam (VALIUM) 5 MG tablet Take 5 mg by mouth every 8 (eight) hours as needed for anxiety.    Historical Provider, MD  DM-Doxylamine-Acetaminophen (NYQUIL COLD & FLU PO) Take 10 mLs by mouth every 6 (six) hours as needed (for cold).    Historical Provider, MD  hydroquinone 4 % cream Apply 1 application topically 2 (two) times daily. 03/11/14   Historical Provider, MD  ibuprofen (ADVIL,MOTRIN) 200 MG tablet Take 400 mg by mouth every 6 (six) hours as needed for headache.    Historical Provider, MD  meclizine (ANTIVERT) 50 MG tablet Take 1 tablet (50 mg total) by mouth 3 (three) times daily as needed for dizziness. 01/22/14   Domenic Moras, PA-C  metFORMIN (GLUCOPHAGE) 850 MG tablet Take 850 mg by mouth 2 (two) times daily with a meal.    Historical Provider, MD  Multiple Vitamins-Minerals (AIRBORNE) CHEW Chew 3 each by mouth every morning.    Historical Provider, MD  phenazopyridine (PYRIDIUM) 200 MG tablet Take 1 tablet (200 mg total) by mouth 3 (three) times daily. 03/29/14   Leata Mouse, MD  promethazine (PHENERGAN) 25 MG tablet Take 25 mg by mouth daily as needed for nausea or vomiting.     Historical Provider, MD  ranitidine (ZANTAC) 150 MG tablet Take 150 mg by mouth daily.    Historical Provider, MD  Vitamin D, Ergocalciferol, (DRISDOL) 50000 UNITS CAPS capsule Take 50,000 Units by mouth every 7 (seven) days. Takes on Monday.    Historical Provider, MD   BP 199/51 mmHg  Pulse 73  Temp(Src) 98.2 F (36.8 C) (Oral)  Resp 20  Ht 5\' 8"  (1.727 m)  Wt 289 lb (131.09 kg)  BMI 43.95 kg/m2  SpO2 99%  LMP 09/30/2014 Physical Exam  Constitutional:  She is oriented to person, place, and time. She appears well-developed and well-nourished. No distress.  HENT:  Head: Atraumatic.  Nose: Nose normal.  Mouth/Throat: Oropharynx is clear and moist.  No sinus or temporal tenderness.  Eyes: Conjunctivae and EOM are normal. Pupils are equal, round, and reactive to light. No scleral icterus.  Neck: Neck supple. No tracheal deviation present. No thyromegaly present.  No stiffness or rigidity.   Cardiovascular: Normal rate, regular rhythm, normal heart sounds and intact distal pulses.  Exam reveals no gallop and no friction rub.   No murmur heard. Pulmonary/Chest: Effort normal and breath sounds normal. No respiratory distress.  Abdominal: Soft. Normal appearance and bowel sounds are normal. She exhibits no distension. There is no tenderness.  Genitourinary:  No cva tenderness.  Musculoskeletal: Normal range of motion. She exhibits no edema or tenderness.  Neurological: She is alert and oriented to person, place, and time. No cranial  nerve deficit.  No pronator drift. Motor intact bilaterally. sens grossly intact. Steady gait.   Skin: Skin is warm and dry. No rash noted. She is not diaphoretic.  Psychiatric: She has a normal mood and affect.  Nursing note and vitals reviewed.   ED Course  Procedures (including critical care time) Labs Review  Results for orders placed or performed during the hospital encounter of 10/16/14  Troponin I  Result Value Ref Range   Troponin I <0.03 <0.031 ng/mL   Ct Head Wo Contrast  10/16/2014   CLINICAL DATA:  New onset headache 5:30 a.m. Numbness in the left hand.  EXAM: CT HEAD WITHOUT CONTRAST  TECHNIQUE: Contiguous axial images were obtained from the base of the skull through the vertex without intravenous contrast.  COMPARISON:  01/13/2010  FINDINGS: There is no evidence of mass effect, midline shift or extra-axial fluid collections. There is no evidence of a space-occupying lesion or intracranial  hemorrhage. There is no evidence of a cortical-based area of acute infarction.  The ventricles and sulci are appropriate for the patient's age. The basal cisterns are patent.  Visualized portions of the orbits are unremarkable. The visualized portions of the paranasal sinuses and mastoid air cells are unremarkable.  The osseous structures are unremarkable.  IMPRESSION: Normal CT of the brain without intravenous contrast.   Electronically Signed   By: Kathreen Devoid   On: 10/16/2014 11:52       EKG Interpretation  Date/Time:  Saturday October 16 2014 12:29:14 EDT Ventricular Rate:  53 PR Interval:  182 QRS Duration: 114 QT Interval:  444 QTC Calculation: 417 R Axis:   37 Text Interpretation:  Sinus rhythm Nonspecific T wave abnormality No significant change since last tracing Confirmed by Ashok Cordia  MD, Lennette Bihari ([id number]) on 10/16/2014 2:50:36 PM         MDM   Ct.  reglan im, imitrex sq.  Reviewed nursing notes and prior charts for additional history.   Recheck headache improved, but not resolved. toradol iv. Acetaminophen po.  Recheck, headache improved/resolved.  Ct neg.  Recheck pt, no headache. No cp. No sob. afeb.  Pt currently appears stable for d/c.       Lajean Saver, MD 10/16/14 1452

## 2014-10-16 NOTE — ED Notes (Signed)
Pt. Stated, i started having a headache with nausea and dizziness 3 hours ago.

## 2015-05-08 HISTORY — PX: COLONOSCOPY: SHX174

## 2015-05-08 HISTORY — PX: ESOPHAGOGASTRODUODENOSCOPY ENDOSCOPY: SHX5814

## 2016-03-14 ENCOUNTER — Ambulatory Visit (INDEPENDENT_AMBULATORY_CARE_PROVIDER_SITE_OTHER): Payer: 59 | Admitting: Obstetrics and Gynecology

## 2016-03-14 ENCOUNTER — Encounter: Payer: Self-pay | Admitting: Obstetrics and Gynecology

## 2016-03-14 VITALS — BP 118/70 | HR 80 | Resp 18 | Ht 68.0 in | Wt 303.0 lb

## 2016-03-14 DIAGNOSIS — R3915 Urgency of urination: Secondary | ICD-10-CM

## 2016-03-14 DIAGNOSIS — N946 Dysmenorrhea, unspecified: Secondary | ICD-10-CM | POA: Diagnosis not present

## 2016-03-14 DIAGNOSIS — Z113 Encounter for screening for infections with a predominantly sexual mode of transmission: Secondary | ICD-10-CM

## 2016-03-14 DIAGNOSIS — R350 Frequency of micturition: Secondary | ICD-10-CM | POA: Diagnosis not present

## 2016-03-14 DIAGNOSIS — Z01419 Encounter for gynecological examination (general) (routine) without abnormal findings: Secondary | ICD-10-CM

## 2016-03-14 DIAGNOSIS — N915 Oligomenorrhea, unspecified: Secondary | ICD-10-CM | POA: Diagnosis not present

## 2016-03-14 DIAGNOSIS — Z0000 Encounter for general adult medical examination without abnormal findings: Secondary | ICD-10-CM | POA: Diagnosis not present

## 2016-03-14 DIAGNOSIS — E559 Vitamin D deficiency, unspecified: Secondary | ICD-10-CM | POA: Diagnosis not present

## 2016-03-14 DIAGNOSIS — E282 Polycystic ovarian syndrome: Secondary | ICD-10-CM

## 2016-03-14 DIAGNOSIS — L680 Hirsutism: Secondary | ICD-10-CM

## 2016-03-14 DIAGNOSIS — Z1151 Encounter for screening for human papillomavirus (HPV): Secondary | ICD-10-CM | POA: Diagnosis not present

## 2016-03-14 DIAGNOSIS — N921 Excessive and frequent menstruation with irregular cycle: Secondary | ICD-10-CM

## 2016-03-14 DIAGNOSIS — R3911 Hesitancy of micturition: Secondary | ICD-10-CM

## 2016-03-14 DIAGNOSIS — Z124 Encounter for screening for malignant neoplasm of cervix: Secondary | ICD-10-CM

## 2016-03-14 LAB — LIPID PANEL
CHOLESTEROL: 194 mg/dL (ref <200)
HDL: 40 mg/dL — AB (ref >50)
LDL CALC: 128 mg/dL — AB (ref <100)
Total CHOL/HDL Ratio: 4.9 Ratio (ref <5.0)
Triglycerides: 128 mg/dL (ref <150)
VLDL: 26 mg/dL (ref <30)

## 2016-03-14 LAB — CBC
HCT: 38.4 % (ref 35.0–45.0)
Hemoglobin: 12.5 g/dL (ref 11.7–15.5)
MCH: 26 pg — ABNORMAL LOW (ref 27.0–33.0)
MCHC: 32.6 g/dL (ref 32.0–36.0)
MCV: 79.8 fL — AB (ref 80.0–100.0)
MPV: 8.4 fL (ref 7.5–12.5)
Platelets: 315 10*3/uL (ref 140–400)
RBC: 4.81 MIL/uL (ref 3.80–5.10)
RDW: 15.3 % — ABNORMAL HIGH (ref 11.0–15.0)
WBC: 8.4 10*3/uL (ref 3.8–10.8)

## 2016-03-14 LAB — POCT URINALYSIS DIPSTICK
BILIRUBIN UA: NEGATIVE
Glucose, UA: NEGATIVE
Ketones, UA: NEGATIVE
LEUKOCYTES UA: NEGATIVE
NITRITE UA: NEGATIVE
Protein, UA: NEGATIVE
Urobilinogen, UA: NEGATIVE
pH, UA: 7

## 2016-03-14 LAB — COMPREHENSIVE METABOLIC PANEL
ALK PHOS: 56 U/L (ref 33–115)
ALT: 15 U/L (ref 6–29)
AST: 15 U/L (ref 10–30)
Albumin: 3.9 g/dL (ref 3.6–5.1)
BUN: 14 mg/dL (ref 7–25)
CALCIUM: 8.8 mg/dL (ref 8.6–10.2)
CO2: 23 mmol/L (ref 20–31)
Chloride: 105 mmol/L (ref 98–110)
Creat: 0.77 mg/dL (ref 0.50–1.10)
Glucose, Bld: 146 mg/dL — ABNORMAL HIGH (ref 65–99)
Potassium: 4.1 mmol/L (ref 3.5–5.3)
Sodium: 138 mmol/L (ref 135–146)
TOTAL PROTEIN: 6.9 g/dL (ref 6.1–8.1)
Total Bilirubin: 0.2 mg/dL (ref 0.2–1.2)

## 2016-03-14 LAB — TSH: TSH: 1.67 mIU/L

## 2016-03-14 LAB — POCT URINE PREGNANCY: Preg Test, Ur: NEGATIVE

## 2016-03-14 NOTE — Patient Instructions (Addendum)
Polycystic Ovarian Syndrome Polycystic ovarian syndrome (PCOS) is a common hormonal disorder among women of reproductive age. Most women with PCOS grow many small cysts on their ovaries. PCOS can cause problems with your periods and make it difficult to get pregnant. It can also cause an increased risk of miscarriage with pregnancy. If left untreated, PCOS can lead to serious health problems, such as diabetes and heart disease. CAUSES The cause of PCOS is not fully understood, but genetics may be a factor. SIGNS AND SYMPTOMS   Infrequent or no menstrual periods.   Inability to get pregnant (infertility) because of not ovulating.   Increased growth of hair on the face, chest, stomach, back, thumbs, thighs, or toes.   Acne, oily skin, or dandruff.   Pelvic pain.   Weight gain or obesity, usually carrying extra weight around the waist.   Type 2 diabetes.   High cholesterol.   High blood pressure.   Female-pattern baldness or thinning hair.   Patches of thickened and dark brown or black skin on the neck, arms, breasts, or thighs.   Tiny excess flaps of skin (skin tags) in the armpits or neck area.   Excessive snoring and having breathing stop at times while asleep (sleep apnea).   Deepening of the voice.   Gestational diabetes when pregnant.  DIAGNOSIS  There is no single test to diagnose PCOS.   Your health care provider will:   Take a medical history.   Perform a pelvic exam.   Have ultrasonography done.   Check your female and female hormone levels.   Measure glucose or sugar levels in the blood.   Do other blood tests.   If you are producing too many female hormones, your health care provider will make sure it is from PCOS. At the physical exam, your health care provider will want to evaluate the areas of increased hair growth. Try to allow natural hair growth for a few days before the visit.   During a pelvic exam, the ovaries may be enlarged  or swollen because of the increased number of small cysts. This can be seen more easily by using vaginal ultrasonography or screening to examine the ovaries and lining of the uterus (endometrium) for cysts. The uterine lining may become thicker if you have not been having a regular period.  TREATMENT  Because there is no cure for PCOS, it needs to be managed to prevent problems. Treatments are based on your symptoms. Treatment is also based on whether you want to have a baby or whether you need contraception.  Treatment may include:   Progesterone hormone to start a menstrual period.   Birth control pills to make you have regular menstrual periods.   Medicines to make you ovulate, if you want to get pregnant.   Medicines to control your insulin.   Medicine to control your blood pressure.   Medicine and diet to control your high cholesterol and triglycerides in your blood.  Medicine to reduce excessive hair growth.  Surgery, making small holes in the ovary, to decrease the amount of female hormone production. This is done through a long, lighted tube (laparoscope) placed into the pelvis through a tiny incision in the lower abdomen.  HOME CARE INSTRUCTIONS  Only take over-the-counter or prescription medicine as directed by your health care provider.  Pay attention to the foods you eat and your activity levels. This can help reduce the effects of PCOS.  Keep your weight under control.  Eat foods that are   low in carbohydrate and high in fiber.  Exercise regularly. SEEK MEDICAL CARE IF:  Your symptoms do not get better with medicine.  You have new symptoms.   This information is not intended to replace advice given to you by your health care provider. Make sure you discuss any questions you have with your health care provider.   Document Released: 08/17/2004 Document Revised: 02/11/2013 Document Reviewed: 10/09/2012 Elsevier Interactive Patient Education 2016 [place] AND DIET:  We recommended that you start or continue a regular exercise program for good health. Regular exercise means any activity that makes your heart beat faster and makes you sweat.  We recommend exercising at least 30 minutes per day at least 3 days a week, preferably 4 or 5.  We also recommend a diet low in fat and sugar.  Inactivity, poor dietary choices and obesity can cause diabetes, heart attack, stroke, and kidney damage, among others.    ALCOHOL AND SMOKING:  Women should limit their alcohol intake to no more than 7 drinks/beers/glasses of wine (combined, not each!) per week. Moderation of alcohol intake to this level decreases your risk of breast cancer and liver damage. And of course, no recreational drugs are part of a healthy lifestyle.  And absolutely no smoking or even second hand smoke. Most people know smoking can cause heart and lung diseases, but did you know it also contributes to weakening of your bones? Aging of your skin?  Yellowing of your teeth and nails?  CALCIUM AND VITAMIN D:  Adequate intake of calcium and Vitamin D are recommended.  The recommendations for exact amounts of these supplements seem to change often, but generally speaking 600 mg of calcium (either carbonate or citrate) and 800 units of Vitamin D per day seems prudent. Certain women may benefit from higher intake of Vitamin D.  If you are among these women, your doctor will have told you during your visit.    PAP SMEARS:  Pap smears, to check for cervical cancer or precancers,  have traditionally been done yearly, although recent scientific advances have shown that most women can have pap smears less often.  However, every woman still should have a physical exam from her gynecologist every year. It will include a breast check, inspection of the vulva and vagina to check for abnormal growths or skin changes, a visual exam of the cervix, and then an exam to evaluate the size and shape of the uterus  and ovaries.  And after 35 years of age, a rectal exam is indicated to check for rectal cancers. We will also provide age appropriate advice regarding health maintenance, like when you should have certain vaccines, screening for sexually transmitted diseases, bone density testing, colonoscopy, mammograms, etc.   MAMMOGRAMS:  All women over 70 years old should have a yearly mammogram. Many facilities now offer a "3D" mammogram, which may cost around $50 extra out of pocket. If possible,  we recommend you accept the option to have the 3D mammogram performed.  It both reduces the number of women who will be called back for extra views which then turn out to be normal, and it is better than the routine mammogram at detecting truly abnormal areas.    COLONOSCOPY:  Colonoscopy to screen for colon cancer is recommended for all women at age 41.  We know, you hate the idea of the prep.  We agree, BUT, having colon cancer and not knowing it is worse!!  Colon cancer so often  starts as a polyp that can be seen and removed at colonscopy, which can quite literally save your life!  And if your first colonoscopy is normal and you have no family history of colon cancer, most women don't have to have it again for 10 years.  Once every ten years, you can do something that may end up saving your life, right?  We will be happy to help you get it scheduled when you are ready.  Be sure to check your insurance coverage so you understand how much it will cost.  It may be covered as a preventative service at no cost, but you should check your particular policy.

## 2016-03-14 NOTE — Progress Notes (Signed)
35 y.o. PO:[id number] MarriedCaucasianF here C/O urinary frequency and urgency for 2-3 days, voiding small amounts or feels like she can't finish. Not feeling like she is emptying her bladder. No dysuria, but having some SP pain. Often needs to push to void. Some urge incontinence. Cycles are irregular, typically every 2-3 months, can range from 1-6 months. Bleeding for 2-20 days. Over the last few months: May was 15 days, July for 8 days, August was 14 days, no bleeding since August. She has tried to get pregnant for years, has used clomid and metformin. Stopped 3 years ago. At this point she would like to prevent pregnancy.  She has some hair growth on chin and on her breasts, on the breasts is new.  Husband had an affair last month, over. They are going to counseling.   Period Duration (Days): 2-20 days very irregular due to PCOS Period Pattern: (!) Irregular Menstrual Flow: Heavy Menstrual Control: Maxi pad Menstrual Control Change Freq (Hours): changes pad every 2 hours on heavy days  Dysmenorrhea: (!) Moderate Dysmenorrhea Symptoms: Cramping, Headache  Some spotting in between her cycles. Gets nausea with her cycles.   Patient's last menstrual period was 12/16/2015.          Sexually active: Yes.    The current method of family planning is none.    Exercising: Yes.    Cardio Smoker:  Former smoker  Health Maintenance: Pap:  2015 WNL per patient  History of abnormal Pap:  Yes- years ago, no cervical surgery.  MMG:  Never Colonoscopy:  11/2014 Polyps repeat in 5 years  BMD:   Never TDaP:  2017 Gardasil: N/A   reports that she has quit smoking. Her smoking use included Cigarettes. She has never used smokeless tobacco. She reports that she drinks alcohol. She reports that she does not use drugs.Occasional ETOH. Son is 15  Past Medical History:  Diagnosis Date  . Abnormal uterine bleeding   . Amenorrhea   . Anemia   . Anxiety    panic attacks  . Dysmenorrhea   . GERD  (gastroesophageal reflux disease)   . Hyperlipidemia   . Infertility, female   . Insulin resistance   . Migraine without aura   . Obesity   . PCOS (polycystic ovarian syndrome)   . PCOS (polycystic ovarian syndrome)   . PONV (postoperative nausea and vomiting)   . Urinary incontinence     Past Surgical History:  Procedure Laterality Date  . CHOLECYSTECTOMY    . DILATATION & CURRETTAGE/HYSTEROSCOPY WITH RESECTOCOPE N/A 10/17/2012   Procedure: DILATATION & CURETTAGE/HYSTEROSCOPY WITH RESECTOCOPE; Possible Polypectomy, Possible Resectoscopic Myomectomy.;  Surgeon: Floyce Stakes. Pamala Hurry, MD;  Location: [facility];  Service: Gynecology;  Laterality: N/A;  1 hr.  Marland Kitchen DILATION AND CURETTAGE OF UTERUS    . HYSTEROSCOPY W/D&C N/A 12/01/2013   Procedure: IUD Removal;  Surgeon: Floyce Stakes. Pamala Hurry, MD;  Location: [facility];  Service: Gynecology;  Laterality: N/A;  90 min.  . INTRAUTERINE DEVICE (IUD) INSERTION N/A 10/17/2012   Procedure: INTRAUTERINE DEVICE (IUD) INSERTION; Mirena;  Surgeon: Floyce Stakes. Pamala Hurry, MD;  Location: [facility];  Service: Gynecology;  Laterality: N/A;  She had a uterine perforation with an IUD, stenotic cervix placed in the or and removed in the or.   Current Outpatient Prescriptions  Medication Sig Dispense Refill  . Biotin 5000 MCG CAPS Take 1 capsule by mouth daily.    . calcium carbonate (TUMS - DOSED IN MG ELEMENTAL CALCIUM) 500 MG chewable tablet Chew 3 tablets  by mouth daily as needed for indigestion or heartburn.    . Cholecalciferol (D3-50) 50000 units capsule TAKE ONE CAPSULE BY MOUTH ONCE A WEEK FOR LOW VITAMIN D    . diazepam (VALIUM) 5 MG tablet Take 5 mg by mouth every 8 (eight) hours as needed for anxiety.    . ferrous sulfate 325 (65 FE) MG tablet Take 325 mg by mouth every evening.    Marland Kitchen ibuprofen (ADVIL,MOTRIN) 200 MG tablet Take 400 mg by mouth every 6 (six) hours as needed for headache.    . promethazine (PHENERGAN) 25 MG tablet Take 25 mg by mouth daily as needed for  nausea or vomiting.     . ranitidine (ZANTAC) 150 MG tablet Take 150 mg by mouth daily as needed for heartburn.     . SUMAtriptan (IMITREX) 100 MG tablet Take 1 tablet (100 mg total) by mouth every 2 (two) hours as needed for migraine. May repeat in 2 hours if headache persists or recurs.  Take a maximum of 2 tablets per 24 hour period. 15 tablet 0   No current facility-administered medications for this visit.     Family History  Problem Relation Age of Onset  . Diabetes Father   . Thyroid disease Father   . Diabetes Maternal Grandmother   . Heart disease Maternal Grandmother   . Diabetes Maternal Grandfather   . Heart disease Maternal Grandfather   . Stroke Maternal Grandfather   . Diabetes Paternal Grandmother   . Diabetes Paternal Grandfather   . Heart disease Paternal Grandfather   . Diabetes Other   . Obesity Other   . Sleep apnea Other   . Hyperlipidemia Other   . Hypertension Other   . Cancer Other   . Heart disease Other     Review of Systems  Constitutional: Negative.   HENT: Negative.   Eyes: Negative.   Respiratory: Negative.   Cardiovascular: Negative.   Gastrointestinal: Negative.   Endocrine: Negative.   Genitourinary: Positive for frequency and urgency.  Musculoskeletal: Negative.   Skin: Negative.   Allergic/Immunologic: Negative.   Neurological: Negative.   Psychiatric/Behavioral: Negative.     Exam:   BP 118/70 (BP Location: Right Arm, Patient Position: Sitting, Cuff Size: Large)   Pulse 80   Resp 18   Ht 5\' 8"  (1.727 m)   Wt (!) 303 lb (137.4 kg)   LMP 12/16/2015   BMI 46.07 kg/m   Weight change: @WEIGHTCHANGE @ Height:   Height: 5\' 8"  (172.7 cm)  Ht Readings from Last 3 Encounters:  03/14/16 5\' 8"  (1.727 m)  10/16/14 5\' 8"  (1.727 m)  03/29/14 5\' 9"  (1.753 m)    General appearance: alert, cooperative and appears stated age Head: Normocephalic, without obvious abnormality, atraumatic Neck: no adenopathy, supple, symmetrical, trachea  midline and thyroid normal to inspection and palpation Lungs: clear to auscultation bilaterally Breasts: normal appearance, no masses or tenderness Heart: regular rate and rhythm Abdomen: soft, non-tender; bowel sounds normal; no masses,  no organomegaly Extremities: extremities normal, atraumatic, no cyanosis or edema Skin: Skin color, texture, turgor normal. No rashes or lesions Lymph nodes: Cervical, supraclavicular, and axillary nodes normal. No abnormal inguinal nodes palpated Neurologic: Grossly normal   Pelvic: External genitalia:  no lesions              Urethra:  normal appearing urethra with no masses, tenderness or lesions              Bartholins and Skenes: normal  Vagina: normal appearing vagina with normal color and discharge, no lesions              Cervix: no lesions               Bimanual Exam:  Uterus:  normal size, contour, position, consistency, mobility, non-tender and anteverted              Adnexa: no mass, fullness, tenderness               Rectovaginal: Confirms               Anus:  normal sphincter tone, no lesions  Chaperone was present for exam.  A:  Well Woman with normal exam  Oligomenorrhea with abnormal uterine bleeding  Overweight  PCOS  Urinary urgency and frequency, not sure she is emptying her bladder  Dysmenorrhea  Hirsutism    P:   UPT negative  Urine dip only + for blood, just starting her cycle today  Pap with hpv  Urine for ua, c&s  Discussed weight watchers and exercise  Screening for STDs  Urine for ua, c&s  Return for an endometrial biopsy in 2 weeks (use condoms, check UPT first)  Return for an ultrasound (will check PVR also), possible sonohysterogram  Discussed the risk of long term oligomenorrhea  Recommended a mirena IUD after completion of her w/u

## 2016-03-15 LAB — STD PANEL
HEP B S AG: NEGATIVE
HIV: NONREACTIVE

## 2016-03-15 LAB — URINALYSIS, MICROSCOPIC ONLY
BACTERIA UA: NONE SEEN [HPF]
CASTS: NONE SEEN [LPF]
CRYSTALS: NONE SEEN [HPF]
WBC, UA: NONE SEEN WBC/HPF (ref <=5)
Yeast: NONE SEEN [HPF]

## 2016-03-15 LAB — DHEA-SULFATE: DHEA SO4: 121 ug/dL (ref 23–266)

## 2016-03-15 LAB — HEMOGLOBIN A1C
HEMOGLOBIN A1C: 6.5 % — AB (ref <5.7)
MEAN PLASMA GLUCOSE: 140 mg/dL

## 2016-03-15 LAB — FERRITIN: FERRITIN: 9 ng/mL — AB (ref 10–154)

## 2016-03-15 LAB — HEPATITIS C ANTIBODY: HCV Ab: NEGATIVE

## 2016-03-15 LAB — VITAMIN D 25 HYDROXY (VIT D DEFICIENCY, FRACTURES): Vit D, 25-Hydroxy: 30 ng/mL (ref 30–100)

## 2016-03-16 LAB — URINE CULTURE: Organism ID, Bacteria: NO GROWTH

## 2016-03-16 LAB — IPS N GONORRHOEA AND CHLAMYDIA BY PCR

## 2016-03-16 LAB — TESTOSTERONE, TOTAL, LC/MS/MS: TESTOSTERONE, TOTAL, LC-MS-MS: 21 ng/dL (ref 2–45)

## 2016-03-17 LAB — 17-HYDROXYPROGESTERONE: 17-OH-PROGESTERONE, LC/MS/MS: 11 ng/dL

## 2016-03-19 LAB — IPS PAP TEST WITH HPV

## 2016-03-22 ENCOUNTER — Encounter: Payer: Self-pay | Admitting: Obstetrics and Gynecology

## 2016-03-27 ENCOUNTER — Other Ambulatory Visit: Payer: 59 | Admitting: Obstetrics and Gynecology

## 2016-03-27 ENCOUNTER — Other Ambulatory Visit: Payer: 59

## 2016-03-27 ENCOUNTER — Ambulatory Visit: Payer: 59

## 2016-03-27 ENCOUNTER — Other Ambulatory Visit: Payer: Self-pay | Admitting: Obstetrics and Gynecology

## 2016-03-27 ENCOUNTER — Ambulatory Visit (INDEPENDENT_AMBULATORY_CARE_PROVIDER_SITE_OTHER): Payer: 59 | Admitting: Obstetrics and Gynecology

## 2016-03-27 ENCOUNTER — Encounter: Payer: Self-pay | Admitting: Obstetrics and Gynecology

## 2016-03-27 VITALS — BP 122/80 | HR 72 | Resp 16 | Wt 301.0 lb

## 2016-03-27 DIAGNOSIS — N921 Excessive and frequent menstruation with irregular cycle: Secondary | ICD-10-CM | POA: Diagnosis not present

## 2016-03-27 DIAGNOSIS — N915 Oligomenorrhea, unspecified: Secondary | ICD-10-CM

## 2016-03-27 DIAGNOSIS — Z01812 Encounter for preprocedural laboratory examination: Secondary | ICD-10-CM

## 2016-03-27 DIAGNOSIS — N840 Polyp of corpus uteri: Secondary | ICD-10-CM | POA: Diagnosis not present

## 2016-03-27 DIAGNOSIS — N946 Dysmenorrhea, unspecified: Secondary | ICD-10-CM | POA: Diagnosis not present

## 2016-03-27 DIAGNOSIS — N949 Unspecified condition associated with female genital organs and menstrual cycle: Secondary | ICD-10-CM

## 2016-03-27 DIAGNOSIS — Z3009 Encounter for other general counseling and advice on contraception: Secondary | ICD-10-CM

## 2016-03-27 LAB — POCT URINE PREGNANCY: PREG TEST UR: NEGATIVE

## 2016-03-27 MED ORDER — MEDROXYPROGESTERONE ACETATE 10 MG PO TABS: 10.0000 mg | ORAL_TABLET | Freq: Every day | ORAL | 0 refills | Status: DC

## 2016-03-27 MED ORDER — CHOLECALCIFEROL 1.25 MG (50000 UT) PO CAPS: ORAL_CAPSULE | ORAL | 3 refills | Status: DC

## 2016-03-27 MED ORDER — MISOPROSTOL 200 MCG PO TABS: ORAL_TABLET | ORAL | 0 refills | Status: DC

## 2016-03-27 NOTE — Addendum Note (Signed)
Addended by: Dorothy Spark on: 03/27/2016 07:23 PM   Modules accepted: Orders

## 2016-03-27 NOTE — Addendum Note (Signed)
Addended by: Dorothy Spark on: 03/27/2016 06:01 PM   Modules accepted: Orders

## 2016-03-27 NOTE — Progress Notes (Addendum)
GYNECOLOGY  VISIT   HPI: 35 y.o.   Married  Caucasian  female   [phone] with Patient's last menstrual period was 12/16/2015.   here for follow up abnormal uterine bleeding     GYNECOLOGIC HISTORY: Patient's last menstrual period was 12/16/2015. Contraception:none  Menopausal hormone therapy: none        OB History    Gravida Para Term Preterm AB Living   3 1 1   2 1    SAB TAB Ectopic Multiple Live Births   2       1         Patient Active Problem List   Diagnosis Date Noted  . LOW BACK PAIN, ACUTE 11/20/2006  . POLYCYSTIC OVARY 07/04/2006  . OBESITY, NOS 07/04/2006  . DEPRESSIVE DISORDER, NOS 07/04/2006  . TOBACCO USE, QUIT 07/04/2006    Past Medical History:  Diagnosis Date  . Abnormal uterine bleeding   . Amenorrhea   . Anemia   . Anxiety    panic attacks  . Dysmenorrhea   . GERD (gastroesophageal reflux disease)   . Hyperlipidemia   . Infertility, female   . Insulin resistance   . Migraine without aura   . Obesity   . PCOS (polycystic ovarian syndrome)   . PCOS (polycystic ovarian syndrome)   . PONV (postoperative nausea and vomiting)   . Urinary incontinence     Past Surgical History:  Procedure Laterality Date  . CHOLECYSTECTOMY    . DILATATION & CURRETTAGE/HYSTEROSCOPY WITH RESECTOCOPE N/A 10/17/2012   Procedure: DILATATION & CURETTAGE/HYSTEROSCOPY WITH RESECTOCOPE; Possible Polypectomy, Possible Resectoscopic Myomectomy.;  Surgeon: Floyce Stakes. Pamala Hurry, MD;  Location: [facility];  Service: Gynecology;  Laterality: N/A;  1 hr.  Marland Kitchen DILATION AND CURETTAGE OF UTERUS    . ENDOMETRIAL BIOPSY    . HYSTEROSCOPY W/D&C N/A 12/01/2013   Procedure: IUD Removal;  Surgeon: Floyce Stakes. Pamala Hurry, MD;  Location: [facility];  Service: Gynecology;  Laterality: N/A;  90 min.  . INTRAUTERINE DEVICE (IUD) INSERTION N/A 10/17/2012   Procedure: INTRAUTERINE DEVICE (IUD) INSERTION; Mirena;  Surgeon: Floyce Stakes. Pamala Hurry, MD;  Location: [facility];  Service: Gynecology;  Laterality: N/A;    Current  Outpatient Prescriptions  Medication Sig Dispense Refill  . Biotin 5000 MCG CAPS Take 1 capsule by mouth daily.    . calcium carbonate (TUMS - DOSED IN MG ELEMENTAL CALCIUM) 500 MG chewable tablet Chew 3 tablets by mouth daily as needed for indigestion or heartburn.    . Cholecalciferol (D3-50) 50000 units capsule TAKE ONE CAPSULE BY MOUTH ONCE A WEEK FOR LOW VITAMIN D    . diazepam (VALIUM) 5 MG tablet Take 5 mg by mouth every 8 (eight) hours as needed for anxiety.    . ferrous sulfate 325 (65 FE) MG tablet Take 325 mg by mouth every evening.    Marland Kitchen ibuprofen (ADVIL,MOTRIN) 200 MG tablet Take 400 mg by mouth every 6 (six) hours as needed for headache.    . promethazine (PHENERGAN) 25 MG tablet Take 25 mg by mouth daily as needed for nausea or vomiting.     . ranitidine (ZANTAC) 150 MG tablet Take 150 mg by mouth daily as needed for heartburn.     . SUMAtriptan (IMITREX) 100 MG tablet Take 1 tablet (100 mg total) by mouth every 2 (two) hours as needed for migraine. May repeat in 2 hours if headache persists or recurs.  Take a maximum of 2 tablets per 24 hour period. 15 tablet 0   No current  facility-administered medications for this visit.      ALLERGIES: Iohexol  Family History  Problem Relation Age of Onset  . Diabetes Father   . Thyroid disease Father   . Diabetes Maternal Grandmother   . Heart disease Maternal Grandmother   . Diabetes Maternal Grandfather   . Heart disease Maternal Grandfather   . Stroke Maternal Grandfather   . Diabetes Paternal Grandmother   . Diabetes Paternal Grandfather   . Heart disease Paternal Grandfather   . Diabetes Other   . Obesity Other   . Sleep apnea Other   . Hyperlipidemia Other   . Hypertension Other   . Cancer Other   . Heart disease Other     Social History   Social History  . Marital status: Married    Spouse name: N/A  . Number of children: N/A  . Years of education: N/A   Occupational History  . Not on file.   Social History  Main Topics  . Smoking status: Former Smoker    Types: Cigarettes  . Smokeless tobacco: Never Used  . Alcohol use Yes     Comment: occasional  . Drug use: No  . Sexual activity: Yes    Partners: Male    Birth control/ protection: None   Other Topics Concern  . Not on file   Social History Narrative  . No narrative on file    Review of Systems  Constitutional: Negative.   HENT: Negative.   Gastrointestinal: Negative.   Genitourinary:       Abdominal cramping   Musculoskeletal: Negative.   Skin: Negative.   Neurological: Negative.   Endo/Heme/Allergies: Negative.   Psychiatric/Behavioral: Negative.     PHYSICAL EXAMINATION:    BP 122/80 (BP Location: Right Arm, Patient Position: Sitting, Cuff Size: Large)   Pulse 72   Resp 16   Wt (!) 301 lb (136.5 kg)   LMP 12/16/2015   BMI 45.77 kg/m     General appearance: alert, cooperative and appears stated age  Pelvic: External genitalia:  no lesions              Urethra:  normal appearing urethra with no masses, tenderness or lesions              Bartholins and Skenes: normal                 Vagina: normal appearing vagina with normal color and discharge, no lesions              Cervix: no lesions  Chaperone was present for exam.  Sonohysterogram The procedure and risks of the procedure were reviewed with the patient, consent form was signed. A speculum was placed in the vagina and the cervix was cleansed with betadine. The sonohysterogram catheter was inserted into the uterine cavity without difficulty. Saline was infused under direct observation with the ultrasound. No intracavitary defects were noted.The catheter was removed.   The risks of endometrial biopsy were reviewed and a consent was obtained.  A speculum was placed in the vagina and the cervix was cleansed with betadine. The pipelle was placed into the endometrial cavity. The uterus sounded to 8-9 cm. The endometrial biopsy was performed, moderate to a large  amount of tissue was obtained. The speculum was removed. There were no complications.    Ultrasound images reviewed with the patient  ASSESSMENT Oligomenorrhea Abnormal uterine bleeding Dysmenorrhea Left adnexal cyst, complex (?tube and ovary vs just ovary)     PLAN Endometrial  biopsy Provera Return for mirena IUD insertion while on cycle from provera Use condoms if sexually active F/U in 2 months   An After Visit Summary was printed and given to the patient.  25 minutes face to face time of which over 50% was spent in counseling.   Addendum: the patient requests cytotec prior to IUD insertion, script sent.

## 2016-04-09 MED FILL — miSOPROStol 200 MCG TABS: 200 | 1 days supply | Qty: 2 | Fill #0

## 2016-04-09 MED FILL — MEDROXYPROGESTERONE 10 MG T: 10 | 10 days supply | Qty: 10 | Fill #0

## 2016-04-09 MED FILL — VITAMIN D3 50000 UNIT CAPS: 1.25 MG | 84 days supply | Qty: 12 | Fill #0

## 2016-04-11 DIAGNOSIS — J209 Acute bronchitis, unspecified: Secondary | ICD-10-CM | POA: Diagnosis not present

## 2016-04-12 MED FILL — predniSONE 10 MG TABS: 10 | 12 days supply | Qty: 42 | Fill #0

## 2016-04-12 MED FILL — VENTOLIN HFA 90 MCG INHALER: 108 (90 BAS | 25 days supply | Qty: 18 | Fill #0

## 2016-04-19 ENCOUNTER — Ambulatory Visit (INDEPENDENT_AMBULATORY_CARE_PROVIDER_SITE_OTHER): Payer: 59

## 2016-04-19 ENCOUNTER — Ambulatory Visit (INDEPENDENT_AMBULATORY_CARE_PROVIDER_SITE_OTHER): Payer: 59 | Admitting: Obstetrics and Gynecology

## 2016-04-19 ENCOUNTER — Other Ambulatory Visit: Payer: Self-pay | Admitting: Obstetrics and Gynecology

## 2016-04-19 VITALS — BP 132/80 | HR 82 | Ht 68.0 in | Wt 301.0 lb

## 2016-04-19 DIAGNOSIS — Z30430 Encounter for insertion of intrauterine contraceptive device: Secondary | ICD-10-CM

## 2016-04-19 DIAGNOSIS — Z3009 Encounter for other general counseling and advice on contraception: Secondary | ICD-10-CM | POA: Diagnosis not present

## 2016-04-19 DIAGNOSIS — F418 Other specified anxiety disorders: Secondary | ICD-10-CM

## 2016-04-19 DIAGNOSIS — N921 Excessive and frequent menstruation with irregular cycle: Secondary | ICD-10-CM

## 2016-04-19 LAB — POCT URINE PREGNANCY: PREG TEST UR: NEGATIVE

## 2016-04-19 MED ORDER — CITALOPRAM HYDROBROMIDE 20 MG PO TABS: ORAL_TABLET | ORAL | 1 refills | Status: DC

## 2016-04-19 MED ORDER — OXYCODONE-ACETAMINOPHEN 5-325 MG PO TABS
1.0000 | ORAL_TABLET | ORAL | 0 refills | Status: DC | PRN
Start: 2016-04-19 — End: 2016-05-22

## 2016-04-19 MED FILL — CITALOPRAM HBR 20 MG TABLET: 20 | 30 days supply | Qty: 30 | Fill #0

## 2016-04-19 MED FILL — OXYCODONE W/APAP 5/325 TAB: 5-325 | 1 days supply | Qty: 10 | Fill #0

## 2016-04-19 NOTE — Patient Instructions (Signed)

## 2016-04-19 NOTE — Progress Notes (Signed)
GYNECOLOGY  VISIT   HPI: 35 y.o.   Married  Caucasian  female   [phone] with Patient's last menstrual period was 04/16/2016.   here for  IUD Insertion for contraception, oligomenorrhea, and AUB.  She c/o worsening depression and anxiety. She and her husband are in counseling together, and she is seeing a Social worker on her own. We have talked about an anti-depressant before. She would like to try one now.   GYNECOLOGIC HISTORY: Patient's last menstrual period was 04/16/2016. Contraception: None Menopausal hormone therapy: None        OB History    Gravida Para Term Preterm AB Living   3 1 1   2 1    SAB TAB Ectopic Multiple Live Births   2       1         Patient Active Problem List   Diagnosis Date Noted  . LOW BACK PAIN, ACUTE 11/20/2006  . POLYCYSTIC OVARY 07/04/2006  . OBESITY, NOS 07/04/2006  . DEPRESSIVE DISORDER, NOS 07/04/2006  . TOBACCO USE, QUIT 07/04/2006    Past Medical History:  Diagnosis Date  . Abnormal uterine bleeding   . Amenorrhea   . Anemia   . Anxiety    panic attacks  . Dysmenorrhea   . GERD (gastroesophageal reflux disease)   . Hyperlipidemia   . Infertility, female   . Insulin resistance   . Migraine without aura   . Obesity   . PCOS (polycystic ovarian syndrome)   . PCOS (polycystic ovarian syndrome)   . PONV (postoperative nausea and vomiting)   . Urinary incontinence     Past Surgical History:  Procedure Laterality Date  . CHOLECYSTECTOMY    . DILATATION & CURRETTAGE/HYSTEROSCOPY WITH RESECTOCOPE N/A 10/17/2012   Procedure: DILATATION & CURETTAGE/HYSTEROSCOPY WITH RESECTOCOPE; Possible Polypectomy, Possible Resectoscopic Myomectomy.;  Surgeon: Floyce Stakes. Pamala Hurry, MD;  Location: [facility];  Service: Gynecology;  Laterality: N/A;  1 hr.  Marland Kitchen DILATION AND CURETTAGE OF UTERUS    . ENDOMETRIAL BIOPSY    . HYSTEROSCOPY W/D&C N/A 12/01/2013   Procedure: IUD Removal;  Surgeon: Floyce Stakes. Pamala Hurry, MD;  Location: [facility];  Service: Gynecology;   Laterality: N/A;  90 min.  . INTRAUTERINE DEVICE (IUD) INSERTION N/A 10/17/2012   Procedure: INTRAUTERINE DEVICE (IUD) INSERTION; Mirena;  Surgeon: Floyce Stakes. Pamala Hurry, MD;  Location: [facility];  Service: Gynecology;  Laterality: N/A;    Current Outpatient Prescriptions  Medication Sig Dispense Refill  . Biotin 5000 MCG CAPS Take 1 capsule by mouth daily.    . calcium carbonate (TUMS - DOSED IN MG ELEMENTAL CALCIUM) 500 MG chewable tablet Chew 3 tablets by mouth daily as needed for indigestion or heartburn.    . Cholecalciferol (D3-50) 50000 units capsule TAKE ONE CAPSULE BY MOUTH ONCE A WEEK FOR LOW VITAMIN D 12 capsule 3  . diazepam (VALIUM) 5 MG tablet Take 5 mg by mouth every 8 (eight) hours as needed for anxiety.    . ferrous sulfate 325 (65 FE) MG tablet Take 325 mg by mouth every evening.    Marland Kitchen ibuprofen (ADVIL,MOTRIN) 200 MG tablet Take 400 mg by mouth every 6 (six) hours as needed for headache.    . promethazine (PHENERGAN) 25 MG tablet Take 25 mg by mouth daily as needed for nausea or vomiting.     . ranitidine (ZANTAC) 150 MG tablet Take 150 mg by mouth daily as needed for heartburn.     . SUMAtriptan (IMITREX) 100 MG tablet Take 1 tablet (100  mg total) by mouth every 2 (two) hours as needed for migraine. May repeat in 2 hours if headache persists or recurs.  Take a maximum of 2 tablets per 24 hour period. 15 tablet 0  . VENTOLIN HFA 108 (90 Base) MCG/ACT inhaler   0   No current facility-administered medications for this visit.      ALLERGIES: Iohexol  Family History  Problem Relation Age of Onset  . Diabetes Father   . Thyroid disease Father   . Diabetes Maternal Grandmother   . Heart disease Maternal Grandmother   . Diabetes Maternal Grandfather   . Heart disease Maternal Grandfather   . Stroke Maternal Grandfather   . Diabetes Paternal Grandmother   . Diabetes Paternal Grandfather   . Heart disease Paternal Grandfather   . Diabetes Other   . Obesity Other   . Sleep apnea  Other   . Hyperlipidemia Other   . Hypertension Other   . Cancer Other   . Heart disease Other     Social History   Social History  . Marital status: Married    Spouse name: N/A  . Number of children: N/A  . Years of education: N/A   Occupational History  . Not on file.   Social History Main Topics  . Smoking status: Former Smoker    Types: Cigarettes  . Smokeless tobacco: Never Used  . Alcohol use Yes     Comment: occasional  . Drug use: No  . Sexual activity: Yes    Partners: Male    Birth control/ protection: None   Other Topics Concern  . Not on file   Social History Narrative  . No narrative on file    Review of Systems  Constitutional: Negative.   HENT: Negative.   Eyes: Negative.   Respiratory: Negative.   Cardiovascular: Negative.   Gastrointestinal: Negative.   Genitourinary:       Pain or bleeding with intercourse  Loss of urine with sneeze or cough   Musculoskeletal: Negative.   Skin: Negative.   Neurological: Negative.   Endo/Heme/Allergies: Negative.   Psychiatric/Behavioral: The patient is nervous/anxious.     PHYSICAL EXAMINATION:    BP 132/80 (BP Location: Right Arm, Patient Position: Sitting, Cuff Size: Large)   Pulse 82   Ht 5\' 8"  (1.727 m)   Wt (!) 301 lb (136.5 kg)   LMP 04/16/2016   BMI 45.77 kg/m     General appearance: alert, cooperative and appears stated age  Pelvic: External genitalia:  no lesions              Urethra:  normal appearing urethra with no masses, tenderness or lesions              Bartholins and Skenes: normal                 Vagina: normal appearing vagina with normal color and discharge, no lesions              Cervix: no lesions  The risks of the mirena IUD were reviewed with the patient, including infection, abnormal bleeding and uterine perfortion. Consent was signed.  A speculum was placed in the vagina, the cervix was cleansed with betadine. A tenaculum was placed on the cervix, the uterus sounded  to 7cm. The cervix was dilated to a #5 hagar dilator  The mirena IUD was inserted. The string were cut to 3-4 cm. The tenaculum was removed. Slight oozing from the tenaculum site was stopped  with pressure.   The patient was very crampy during the procedure.   Chaperone was present for exam.  ASSESSMENT IUD insertion, slightly difficult insertion Depression/anxiety    PLAN U/S to check IUD location Start Celexa, f/u in 1 month for IUD check and f/u celexa   An After Visit Summary was printed and given to the patient.   Addendum: on ultrasound the IUD was in the LUS/upper cervix.   IUD was removed and replaced with ultrasound guidance using a tenaculum and dilators. Sounded to 10 cm. In place on ultrasound.   Patient very uncomfortable, will send home with percocet.

## 2016-05-02 ENCOUNTER — Telehealth: Payer: Self-pay | Admitting: *Deleted

## 2016-05-02 NOTE — Telephone Encounter (Signed)
Spoke with patient. Patient states she had Mirena IUD placed 04/19/16. Patient states she has been experiencing light to medium bleeding since placement along with position pain and discomfort. Advised patient is normal to still have bleeding, continue to monitor. If bleeding becomes heavy -changing pad/tampon q1-2 hrs -notify office. Advised patient Dr. Talbert Nan out of the office until 05/08/16, will review with covering provider for recommendations and review, patient is agreeable.   Spoke with Dr. Quincy Simmonds -recommended PUS for pain. Was advised bleeding is normal. Can schedule for PUS 05/03/16 for further evaluation.  Spoke with patient, advised as seen above per Dr. Quincy Simmonds. Patient states she would like to continue to monitor, will notify office 05/03/16 if pain persist. Patient states pain is intermittent and positional at this time. Advised patient if pain that is unrelieved, fever, heavy bleeding or foul smelling discharge. Patient verbalizes understanding and is agreeable.  Routing to provider for final review. Patient is agreeable to disposition. Will close encounter.   Cc: Dr. Talbert Nan

## 2016-05-22 ENCOUNTER — Ambulatory Visit (INDEPENDENT_AMBULATORY_CARE_PROVIDER_SITE_OTHER): Payer: 59 | Admitting: Obstetrics and Gynecology

## 2016-05-22 ENCOUNTER — Encounter: Payer: Self-pay | Admitting: Obstetrics and Gynecology

## 2016-05-22 VITALS — BP 124/80 | HR 74 | Resp 14 | Ht 68.0 in | Wt 309.6 lb

## 2016-05-22 DIAGNOSIS — N83202 Unspecified ovarian cyst, left side: Secondary | ICD-10-CM | POA: Diagnosis not present

## 2016-05-22 DIAGNOSIS — N898 Other specified noninflammatory disorders of vagina: Secondary | ICD-10-CM | POA: Diagnosis not present

## 2016-05-22 DIAGNOSIS — Z30431 Encounter for routine checking of intrauterine contraceptive device: Secondary | ICD-10-CM

## 2016-05-22 DIAGNOSIS — F3289 Other specified depressive episodes: Secondary | ICD-10-CM | POA: Diagnosis not present

## 2016-05-22 MED ORDER — CITALOPRAM HYDROBROMIDE 20 MG PO TABS: ORAL_TABLET | ORAL | 3 refills | Status: DC

## 2016-05-22 NOTE — Progress Notes (Signed)
GYNECOLOGY  VISIT   HPI: 36 y.o.   Married  Caucasian  female   [phone] with No LMP recorded. Patient is not currently having periods (Reason: IUD).   here for IUD follow up. Patient states she is still bleeding since insertion. Still bleeding almost every, varies from light to heavy. Saturates a pad in up to 3 hours. Occasional cramps, getting better.  She is on 10 mg of Celexa a day since 12/19 for depression, feeling better, still having low days. She sometimes notices an odor with the bloody d/c, off and on. No itching, burning or irritation.   GYNECOLOGIC HISTORY: No LMP recorded. Patient is not currently having periods (Reason: IUD). Contraception:IUD Menopausal hormone therapy: none        OB History    Gravida Para Term Preterm AB Living   3 1 1   2 1    SAB TAB Ectopic Multiple Live Births   2       1         Patient Active Problem List   Diagnosis Date Noted  . LOW BACK PAIN, ACUTE 11/20/2006  . POLYCYSTIC OVARY 07/04/2006  . OBESITY, NOS 07/04/2006  . DEPRESSIVE DISORDER, NOS 07/04/2006  . TOBACCO USE, QUIT 07/04/2006    Past Medical History:  Diagnosis Date  . Abnormal uterine bleeding   . Amenorrhea   . Anemia   . Anxiety    panic attacks  . Dysmenorrhea   . GERD (gastroesophageal reflux disease)   . Hyperlipidemia   . Infertility, female   . Insulin resistance   . Migraine without aura   . Obesity   . PCOS (polycystic ovarian syndrome)   . PCOS (polycystic ovarian syndrome)   . PONV (postoperative nausea and vomiting)   . Urinary incontinence     Past Surgical History:  Procedure Laterality Date  . CHOLECYSTECTOMY    . DILATATION & CURRETTAGE/HYSTEROSCOPY WITH RESECTOCOPE N/A 10/17/2012   Procedure: DILATATION & CURETTAGE/HYSTEROSCOPY WITH RESECTOCOPE; Possible Polypectomy, Possible Resectoscopic Myomectomy.;  Surgeon: Floyce Stakes. Pamala Hurry, MD;  Location: [facility];  Service: Gynecology;  Laterality: N/A;  1 hr.  Marland Kitchen DILATION AND CURETTAGE OF UTERUS    .  ENDOMETRIAL BIOPSY    . HYSTEROSCOPY W/D&C N/A 12/01/2013   Procedure: IUD Removal;  Surgeon: Floyce Stakes. Pamala Hurry, MD;  Location: [facility];  Service: Gynecology;  Laterality: N/A;  90 min.  . INTRAUTERINE DEVICE (IUD) INSERTION N/A 10/17/2012   Procedure: INTRAUTERINE DEVICE (IUD) INSERTION; Mirena;  Surgeon: Floyce Stakes. Pamala Hurry, MD;  Location: [facility];  Service: Gynecology;  Laterality: N/A;    Current Outpatient Prescriptions  Medication Sig Dispense Refill  . Biotin 5000 MCG CAPS Take 1 capsule by mouth daily.    . calcium carbonate (TUMS - DOSED IN MG ELEMENTAL CALCIUM) 500 MG chewable tablet Chew 3 tablets by mouth daily as needed for indigestion or heartburn.    . Cholecalciferol (D3-50) 50000 units capsule TAKE ONE CAPSULE BY MOUTH ONCE A WEEK FOR LOW VITAMIN D 12 capsule 3  . citalopram (CELEXA) 20 MG tablet Take 1/2 a tablet a day for 1 week, then increase to 1 tablet a day 30 tablet 1  . diazepam (VALIUM) 5 MG tablet Take 5 mg by mouth every 8 (eight) hours as needed for anxiety.    . ferrous sulfate 325 (65 FE) MG tablet Take 325 mg by mouth every evening.    Marland Kitchen ibuprofen (ADVIL,MOTRIN) 200 MG tablet Take 400 mg by mouth every 6 (six) hours as  needed for headache.    . promethazine (PHENERGAN) 25 MG tablet Take 25 mg by mouth daily as needed for nausea or vomiting.     . ranitidine (ZANTAC) 150 MG tablet Take 150 mg by mouth daily as needed for heartburn.     . SUMAtriptan (IMITREX) 100 MG tablet Take 1 tablet (100 mg total) by mouth every 2 (two) hours as needed for migraine. May repeat in 2 hours if headache persists or recurs.  Take a maximum of 2 tablets per 24 hour period. 15 tablet 0  . VENTOLIN HFA 108 (90 Base) MCG/ACT inhaler   0   No current facility-administered medications for this visit.      ALLERGIES: Iohexol  Family History  Problem Relation Age of Onset  . Diabetes Father   . Thyroid disease Father   . Diabetes Maternal Grandmother   . Heart disease Maternal  Grandmother   . Diabetes Maternal Grandfather   . Heart disease Maternal Grandfather   . Stroke Maternal Grandfather   . Diabetes Paternal Grandmother   . Diabetes Paternal Grandfather   . Heart disease Paternal Grandfather   . Diabetes Other   . Obesity Other   . Sleep apnea Other   . Hyperlipidemia Other   . Hypertension Other   . Cancer Other   . Heart disease Other     Social History   Social History  . Marital status: Married    Spouse name: N/A  . Number of children: N/A  . Years of education: N/A   Occupational History  . Not on file.   Social History Main Topics  . Smoking status: Former Smoker    Types: Cigarettes  . Smokeless tobacco: Never Used  . Alcohol use Yes     Comment: occasional  . Drug use: No  . Sexual activity: Yes    Partners: Male    Birth control/ protection: None   Other Topics Concern  . Not on file   Social History Narrative  . No narrative on file    Review of Systems  Constitutional: Negative.   HENT: Negative.   Eyes: Negative.   Respiratory: Negative.   Cardiovascular: Negative.   Gastrointestinal: Negative.   Genitourinary: Negative.   Musculoskeletal: Negative.   Skin: Negative.   Neurological: Negative.   Endo/Heme/Allergies: Negative.   Psychiatric/Behavioral: Negative.     PHYSICAL EXAMINATION:    BP 124/80 (BP Location: Right Arm, Patient Position: Sitting, Cuff Size: Normal)   Pulse 74   Resp 14   Ht 5\' 8"  (1.727 m)   Wt (!) 309 lb 9.6 oz (140.4 kg)   BMI 47.07 kg/m     General appearance: alert, cooperative and appears stated age   Pelvic: External genitalia:  no lesions              Urethra:  normal appearing urethra with no masses, tenderness or lesions              Bartholins and Skenes: normal                 Vagina: normal appearing vagina with normal color and discharge, no lesions              Cervix: no lesions and iud string 4 cm              Bimanual Exam:  Uterus:  no lesions and IUD string  4 cm  Adnexa: no clear masses, mildly tender on the right                Chaperone was present for exam.  ASSESSMENT IUD check, continued bleeding, reassured wnl Depression is improving Vaginal odor, normal exam Left ovarian cyst    PLAN Increase celexa to 20 mg a day Wet prep probe sent She has a f/u ultrasound next week for her ovarian cyst   An After Visit Summary was printed and given to the patient.

## 2016-05-23 LAB — WET PREP BY MOLECULAR PROBE
CANDIDA SPECIES: NEGATIVE
GARDNERELLA VAGINALIS: POSITIVE — AB
Trichomonas vaginosis: NEGATIVE

## 2016-05-24 ENCOUNTER — Other Ambulatory Visit: Payer: Self-pay | Admitting: Obstetrics and Gynecology

## 2016-05-24 MED ORDER — METRONIDAZOLE 500 MG PO TABS: 500.0000 mg | ORAL_TABLET | Freq: Two times a day (BID) | ORAL | 0 refills | Status: DC

## 2016-05-29 ENCOUNTER — Ambulatory Visit (INDEPENDENT_AMBULATORY_CARE_PROVIDER_SITE_OTHER): Payer: 59 | Admitting: Obstetrics and Gynecology

## 2016-05-29 ENCOUNTER — Encounter: Payer: Self-pay | Admitting: Obstetrics and Gynecology

## 2016-05-29 ENCOUNTER — Ambulatory Visit (INDEPENDENT_AMBULATORY_CARE_PROVIDER_SITE_OTHER): Payer: 59

## 2016-05-29 VITALS — BP 112/70 | HR 72 | Resp 16 | Ht 68.0 in | Wt 306.0 lb

## 2016-05-29 DIAGNOSIS — N9489 Other specified conditions associated with female genital organs and menstrual cycle: Secondary | ICD-10-CM | POA: Diagnosis not present

## 2016-05-29 DIAGNOSIS — N83202 Unspecified ovarian cyst, left side: Secondary | ICD-10-CM | POA: Diagnosis not present

## 2016-05-29 DIAGNOSIS — R1032 Left lower quadrant pain: Secondary | ICD-10-CM

## 2016-05-29 DIAGNOSIS — R102 Pelvic and perineal pain: Secondary | ICD-10-CM

## 2016-05-29 DIAGNOSIS — N949 Unspecified condition associated with female genital organs and menstrual cycle: Secondary | ICD-10-CM | POA: Diagnosis not present

## 2016-05-29 NOTE — Progress Notes (Signed)
GYNECOLOGY  VISIT   HPI: 36 y.o.   Married  Caucasian  female   [phone] with No LMP recorded. Patient is not currently having periods (Reason: IUD).   here for   Pelvic Ultrasound; patient states that she is currently bleeding since December 14 Since November she has had 4 episodes of pain in the LLQ, typically lasts a few hours intermittently. She has had it now for the last 2 days, constant. Up to a 5/10 in severity. Stabbing pain. She has had some constipation in the past. She added a probiotic and can have diarrhea or constipation. Having at least 2 BM's a day. No bladder c/o, no fever, no nausea or emesis. The pain in the LLQ has been going on intermittently for years.  No dysmenorrhea, no dyspareunia.   GYNECOLOGIC HISTORY: No LMP recorded. Patient is not currently having periods (Reason: IUD). Contraception:IUD Menopausal hormone therapy: n/a        OB History    Gravida Para Term Preterm AB Living   3 1 1   2 1    SAB TAB Ectopic Multiple Live Births   2       1         Patient Active Problem List   Diagnosis Date Noted  . LOW BACK PAIN, ACUTE 11/20/2006  . POLYCYSTIC OVARY 07/04/2006  . OBESITY, NOS 07/04/2006  . DEPRESSIVE DISORDER, NOS 07/04/2006  . TOBACCO USE, QUIT 07/04/2006    Past Medical History:  Diagnosis Date  . Abnormal uterine bleeding   . Amenorrhea   . Anemia   . Anxiety    panic attacks  . Dysmenorrhea   . GERD (gastroesophageal reflux disease)   . Hyperlipidemia   . Infertility, female   . Insulin resistance   . Migraine without aura   . Obesity   . PCOS (polycystic ovarian syndrome)   . PCOS (polycystic ovarian syndrome)   . PONV (postoperative nausea and vomiting)   . Urinary incontinence     Past Surgical History:  Procedure Laterality Date  . CHOLECYSTECTOMY    . DILATATION & CURRETTAGE/HYSTEROSCOPY WITH RESECTOCOPE N/A 10/17/2012   Procedure: DILATATION & CURETTAGE/HYSTEROSCOPY WITH RESECTOCOPE; Possible Polypectomy, Possible  Resectoscopic Myomectomy.;  Surgeon: Floyce Stakes. Pamala Hurry, MD;  Location: [facility];  Service: Gynecology;  Laterality: N/A;  1 hr.  Marland Kitchen DILATION AND CURETTAGE OF UTERUS    . ENDOMETRIAL BIOPSY    . HYSTEROSCOPY W/D&C N/A 12/01/2013   Procedure: IUD Removal;  Surgeon: Floyce Stakes. Pamala Hurry, MD;  Location: [facility];  Service: Gynecology;  Laterality: N/A;  90 min.  . INTRAUTERINE DEVICE (IUD) INSERTION N/A 10/17/2012   Procedure: INTRAUTERINE DEVICE (IUD) INSERTION; Mirena;  Surgeon: Floyce Stakes. Pamala Hurry, MD;  Location: [facility];  Service: Gynecology;  Laterality: N/A;    Current Outpatient Prescriptions  Medication Sig Dispense Refill  . Biotin 5000 MCG CAPS Take 1 capsule by mouth daily.    . calcium carbonate (TUMS - DOSED IN MG ELEMENTAL CALCIUM) 500 MG chewable tablet Chew 3 tablets by mouth daily as needed for indigestion or heartburn.    . Cholecalciferol (D3-50) 50000 units capsule TAKE ONE CAPSULE BY MOUTH ONCE A WEEK FOR LOW VITAMIN D 12 capsule 3  . citalopram (CELEXA) 20 MG tablet Take 1 tablet po a day 90 tablet 3  . diazepam (VALIUM) 5 MG tablet Take 5 mg by mouth every 8 (eight) hours as needed for anxiety.    . ferrous sulfate 325 (65 FE) MG tablet  Take 325 mg by mouth every evening.    Marland Kitchen ibuprofen (ADVIL,MOTRIN) 200 MG tablet Take 400 mg by mouth every 6 (six) hours as needed for headache.    . metroNIDAZOLE (FLAGYL) 500 MG tablet Take 1 tablet (500 mg total) by mouth 2 (two) times daily. 14 tablet 0  . promethazine (PHENERGAN) 25 MG tablet Take 25 mg by mouth daily as needed for nausea or vomiting.     . ranitidine (ZANTAC) 150 MG tablet Take 150 mg by mouth daily as needed for heartburn.     . SUMAtriptan (IMITREX) 100 MG tablet Take 1 tablet (100 mg total) by mouth every 2 (two) hours as needed for migraine. May repeat in 2 hours if headache persists or recurs.  Take a maximum of 2 tablets per 24 hour period. 15 tablet 0  . VENTOLIN HFA 108 (90 Base) MCG/ACT inhaler   0   No current  facility-administered medications for this visit.      ALLERGIES: Iohexol  Family History  Problem Relation Age of Onset  . Diabetes Father   . Thyroid disease Father   . Diabetes Maternal Grandmother   . Heart disease Maternal Grandmother   . Diabetes Maternal Grandfather   . Heart disease Maternal Grandfather   . Stroke Maternal Grandfather   . Diabetes Paternal Grandmother   . Diabetes Paternal Grandfather   . Heart disease Paternal Grandfather   . Diabetes Other   . Obesity Other   . Sleep apnea Other   . Hyperlipidemia Other   . Hypertension Other   . Cancer Other   . Heart disease Other     Social History   Social History  . Marital status: Married    Spouse name: N/A  . Number of children: N/A  . Years of education: N/A   Occupational History  . Not on file.   Social History Main Topics  . Smoking status: Former Smoker    Types: Cigarettes  . Smokeless tobacco: Never Used  . Alcohol use Yes     Comment: occasional  . Drug use: No  . Sexual activity: Yes    Partners: Male    Birth control/ protection: None   Other Topics Concern  . Not on file   Social History Narrative  . No narrative on file    ROS  PHYSICAL EXAMINATION:    BP 112/70 (BP Location: Right Arm, Patient Position: Sitting, Cuff Size: Normal)   Pulse 72   Resp 16   Ht 5\' 8"  (1.727 m)   Wt (!) 306 lb (138.8 kg)   BMI 46.53 kg/m     General appearance: alert, cooperative and appears stated age Abdomen: soft, mildly tender in the left lower quadrant, no rebound, no guarding; no masses,  no organomegaly  Pelvic: External genitalia:  no lesions              Cervix: no cervical motion tenderness              Bimanual Exam:  Uterus:  normal size, contour, position, consistency, mobility, non-tender and anteverted              Adnexa: no masses, tender with palpation of the left adnexa               Chaperone was present for exam.   Reviewed the patients ultrasound images from  November and today with the patient. Today the left ovarian cysts are smaller, less complex appearing. On some images from  November, I question a dilated tube.   ASSESSMENT Left ovarian/adnexal cyst, smaller than 2 months ago, still complex (less so)  Long history of LLQ abdominal/pelvic pain, intermittent. Tender on exam in the left adnexal region    PLAN F/U ultrasound in 2 months If her pain is not improving or is getting worse, consider laparoscopy. Discussed it is possible she would still have pain after surgery, even with removing cysts or adnexa. Calendar pain, let me know if it is worsening prior to her next visit   An After Visit Summary was printed and given to the patient.  15 minutes face to face time of which over 50% was spent in counseling.

## 2016-06-06 ENCOUNTER — Ambulatory Visit: Payer: Self-pay | Admitting: Adult Health

## 2016-06-12 DIAGNOSIS — J209 Acute bronchitis, unspecified: Secondary | ICD-10-CM | POA: Diagnosis not present

## 2016-06-21 ENCOUNTER — Encounter (HOSPITAL_COMMUNITY): Payer: Self-pay

## 2016-06-21 ENCOUNTER — Emergency Department (HOSPITAL_COMMUNITY)
Admission: EM | Admit: 2016-06-21 | Discharge: 2016-06-22 | Disposition: A | Discharge status: Home / Self Care | Payer: 59 | Attending: Emergency Medicine | Admitting: Emergency Medicine

## 2016-06-21 DIAGNOSIS — R1031 Right lower quadrant pain: Secondary | ICD-10-CM | POA: Diagnosis not present

## 2016-06-21 DIAGNOSIS — N83202 Unspecified ovarian cyst, left side: Secondary | ICD-10-CM | POA: Insufficient documentation

## 2016-06-21 DIAGNOSIS — R1033 Periumbilical pain: Secondary | ICD-10-CM | POA: Diagnosis not present

## 2016-06-21 DIAGNOSIS — Z87891 Personal history of nicotine dependence: Secondary | ICD-10-CM | POA: Diagnosis not present

## 2016-06-21 DIAGNOSIS — Z79899 Other long term (current) drug therapy: Secondary | ICD-10-CM | POA: Insufficient documentation

## 2016-06-21 DIAGNOSIS — R52 Pain, unspecified: Secondary | ICD-10-CM

## 2016-06-21 LAB — POC URINE PREG, ED: PREG TEST UR: NEGATIVE

## 2016-06-21 LAB — CBC
HCT: 37.1 % (ref 36.0–46.0)
HEMOGLOBIN: 12.3 g/dL (ref 12.0–15.0)
MCH: 26.9 pg (ref 26.0–34.0)
MCHC: 33.2 g/dL (ref 30.0–36.0)
MCV: 81.2 fL (ref 78.0–100.0)
PLATELETS: 276 10*3/uL (ref 150–400)
RBC: 4.57 MIL/uL (ref 3.87–5.11)
RDW: 14.4 % (ref 11.5–15.5)
WBC: 11.2 10*3/uL — ABNORMAL HIGH (ref 4.0–10.5)

## 2016-06-21 LAB — COMPREHENSIVE METABOLIC PANEL
ALK PHOS: 58 U/L (ref 38–126)
ALT: 17 U/L (ref 14–54)
ANION GAP: 9 (ref 5–15)
AST: 18 U/L (ref 15–41)
Albumin: 3.9 g/dL (ref 3.5–5.0)
BUN: 17 mg/dL (ref 6–20)
CALCIUM: 9.4 mg/dL (ref 8.9–10.3)
CO2: 25 mmol/L (ref 22–32)
CREATININE: 0.91 mg/dL (ref 0.44–1.00)
Chloride: 104 mmol/L (ref 101–111)
Glucose, Bld: 154 mg/dL — ABNORMAL HIGH (ref 65–99)
Potassium: 3.9 mmol/L (ref 3.5–5.1)
Sodium: 138 mmol/L (ref 135–145)
TOTAL PROTEIN: 7.7 g/dL (ref 6.5–8.1)
Total Bilirubin: 0.4 mg/dL (ref 0.3–1.2)

## 2016-06-21 LAB — LIPASE, BLOOD: Lipase: 20 U/L (ref 11–51)

## 2016-06-21 NOTE — ED Provider Notes (Signed)
[facility] DEPT Provider Note   CSN: [id number] Arrival date & time: 06/21/16  2110  By signing my name below, I, Reola Mosher, attest that this documentation has been prepared under the direction and in the presence of Jarriel Papillion, MD. Electronically Signed: Reola Mosher, ED Scribe. 06/21/16. 11:36 PM.  History   Chief Complaint Chief Complaint  Patient presents with  . Abdominal Pain   The history is provided by the patient and medical records. No language interpreter was used.  Abdominal Pain   This is a new problem. The current episode started 12 to 24 hours ago. The problem occurs constantly. The problem has been gradually worsening. The pain is associated with an unknown factor. The pain is located in the RLQ and periumbilical region. The pain is at a severity of 4/10. The pain is moderate. Associated symptoms include nausea and constipation. Pertinent negatives include fever, diarrhea, vomiting and dysuria. Nothing aggravates the symptoms. The symptoms are relieved by NSAIDs and antacids. Past workup does not include GI consult. Her past medical history does not include PUD. Past medical history comments: PCOS.    HPI Comments: [name] [name] is a 36 y.o. female with h/o GERD, PCOS, and obesity, who presents to the Emergency Department complaining of constant, gradually worsening, right-sided lower abdominal pain onset ~21 hours ago. Pt reports that her pain radiates from her RLQ into her periumbilical region. She notes associated nausea and constipation. Her last normal bowel movement was yesterday, which she notes is irregular for her. Pt notes that she has a h/o irregular menses, however, she is not currently in menstrual cycle. She has been taking Ibuprofen and antacids at home with mild relief of her pain. Pt has also been using Dulcolax this morning without relief of her constipation. Pt has a PSHx to the abdomen including a cholecystectomy. Pt denies vomiting,  diarrhea, dysuria, or any other associated symptoms.   Past Medical History:  Diagnosis Date  . Abnormal uterine bleeding   . Amenorrhea   . Anemia   . Anxiety    panic attacks  . Dysmenorrhea   . GERD (gastroesophageal reflux disease)   . Hyperlipidemia   . Infertility, female   . Insulin resistance   . Migraine without aura   . Obesity   . PCOS (polycystic ovarian syndrome)   . PCOS (polycystic ovarian syndrome)   . PONV (postoperative nausea and vomiting)   . Urinary incontinence    Patient Active Problem List   Diagnosis Date Noted  . LOW BACK PAIN, ACUTE 11/20/2006  . POLYCYSTIC OVARY 07/04/2006  . OBESITY, NOS 07/04/2006  . DEPRESSIVE DISORDER, NOS 07/04/2006  . TOBACCO USE, QUIT 07/04/2006   Past Surgical History:  Procedure Laterality Date  . CHOLECYSTECTOMY    . DILATATION & CURRETTAGE/HYSTEROSCOPY WITH RESECTOCOPE N/A 10/17/2012   Procedure: DILATATION & CURETTAGE/HYSTEROSCOPY WITH RESECTOCOPE; Possible Polypectomy, Possible Resectoscopic Myomectomy.;  Surgeon: Floyce Stakes. Pamala Hurry, MD;  Location: [facility];  Service: Gynecology;  Laterality: N/A;  1 hr.  Marland Kitchen DILATION AND CURETTAGE OF UTERUS    . ENDOMETRIAL BIOPSY    . HYSTEROSCOPY W/D&C N/A 12/01/2013   Procedure: IUD Removal;  Surgeon: Floyce Stakes. Pamala Hurry, MD;  Location: [facility];  Service: Gynecology;  Laterality: N/A;  90 min.  . INTRAUTERINE DEVICE (IUD) INSERTION N/A 10/17/2012   Procedure: INTRAUTERINE DEVICE (IUD) INSERTION; Mirena;  Surgeon: Floyce Stakes. Pamala Hurry, MD;  Location: [facility];  Service: Gynecology;  Laterality: N/A;   OB History    Gravida  Para Term Preterm AB Living   3 1 1   2 1    SAB TAB Ectopic Multiple Live Births   2       1     Home Medications    Prior to Admission medications   Medication Sig Start Date End Date Taking? Authorizing Provider  Biotin 5000 MCG CAPS Take 1 capsule by mouth daily.    Historical Provider, MD  calcium carbonate (TUMS - DOSED IN MG ELEMENTAL CALCIUM) 500 MG chewable  tablet Chew 3 tablets by mouth daily as needed for indigestion or heartburn.    Historical Provider, MD  Cholecalciferol (D3-50) 50000 units capsule TAKE ONE CAPSULE BY MOUTH ONCE A WEEK FOR LOW VITAMIN D 03/27/16   Salvadore Dom, MD  citalopram (CELEXA) 20 MG tablet Take 1 tablet po a day 05/22/16   Salvadore Dom, MD  diazepam (VALIUM) 5 MG tablet Take 5 mg by mouth every 8 (eight) hours as needed for anxiety.    Historical Provider, MD  ferrous sulfate 325 (65 FE) MG tablet Take 325 mg by mouth every evening.    Historical Provider, MD  ibuprofen (ADVIL,MOTRIN) 200 MG tablet Take 400 mg by mouth every 6 (six) hours as needed for headache.    Historical Provider, MD  metroNIDAZOLE (FLAGYL) 500 MG tablet Take 1 tablet (500 mg total) by mouth 2 (two) times daily. 05/24/16   Salvadore Dom, MD  promethazine (PHENERGAN) 25 MG tablet Take 25 mg by mouth daily as needed for nausea or vomiting.     Historical Provider, MD  ranitidine (ZANTAC) 150 MG tablet Take 150 mg by mouth daily as needed for heartburn.     Historical Provider, MD  SUMAtriptan (IMITREX) 100 MG tablet Take 1 tablet (100 mg total) by mouth every 2 (two) hours as needed for migraine. May repeat in 2 hours if headache persists or recurs.  Take a maximum of 2 tablets per 24 hour period. 10/16/14   Lajean Saver, MD  VENTOLIN HFA 108 [id number] Base) MCG/ACT inhaler  04/12/16   Historical Provider, MD   Family History Family History  Problem Relation Age of Onset  . Diabetes Father   . Thyroid disease Father   . Diabetes Maternal Grandmother   . Heart disease Maternal Grandmother   . Diabetes Maternal Grandfather   . Heart disease Maternal Grandfather   . Stroke Maternal Grandfather   . Diabetes Paternal Grandmother   . Diabetes Paternal Grandfather   . Heart disease Paternal Grandfather   . Diabetes Other   . Obesity Other   . Sleep apnea Other   . Hyperlipidemia Other   . Hypertension Other   . Cancer Other   . Heart  disease Other    Social History Social History  Substance Use Topics  . Smoking status: Former Smoker    Types: Cigarettes  . Smokeless tobacco: Never Used  . Alcohol use Yes     Comment: occasional   Allergies   Iohexol   Review of Systems Review of Systems  Constitutional: Negative for fever.  Gastrointestinal: Positive for abdominal pain, constipation and nausea. Negative for diarrhea and vomiting.  Genitourinary: Negative for dysuria.  All other systems reviewed and are negative.  Physical Exam Updated Vital Signs BP 151/94 (BP Location: Right Wrist)   Pulse 87   Temp 98.7 F (37.1 C) (Oral)   Resp 16   LMP  (Approximate)   SpO2 97%   Physical Exam  Constitutional: She appears well-developed  and well-nourished.  HENT:  Head: Normocephalic.  Mouth/Throat: Oropharynx is clear and moist. No oropharyngeal exudate.  Eyes: Conjunctivae and EOM are normal. Pupils are equal, round, and reactive to light. Right eye exhibits no discharge. Left eye exhibits no discharge. No scleral icterus.  Neck: Normal range of motion. Neck supple. No JVD present. No tracheal deviation present.  Trachea is midline. No stridor or carotid bruits.   Cardiovascular: Normal rate, regular rhythm, normal heart sounds and intact distal pulses.   No murmur heard. Pulmonary/Chest: Effort normal and breath sounds normal. No stridor. No respiratory distress. She has no wheezes. She has no rales.  Lungs CTA bilaterally.  Abdominal: Soft. She exhibits no distension and no mass. Bowel sounds are increased. There is no tenderness. There is no rigidity, no rebound, no guarding, no tenderness at McBurney's point and negative Murphy's sign. No hernia.  Musculoskeletal: Normal range of motion. She exhibits no edema or tenderness.  All compartments are soft. No palpable cords.   Lymphadenopathy:    She has no cervical adenopathy.  Neurological: She is alert. She has normal reflexes. She displays normal  reflexes. She exhibits normal muscle tone.  Skin: Skin is warm and dry. Capillary refill takes less than 2 seconds.  Psychiatric: She has a normal mood and affect. Her behavior is normal.  Nursing note and vitals reviewed.  ED Treatments / Results   Vitals:   06/22/16 0427 06/22/16 0614  BP: (!) 98/50 (!) 115/50  Pulse: 60 63  Resp: 18 20  Temp: 97.7 F (36.5 C) 97.6 F (36.4 C)   Results for orders placed or performed during the hospital encounter of 06/21/16  Lipase, blood  Result Value Ref Range   Lipase 20 11 - 51 U/L  Comprehensive metabolic panel  Result Value Ref Range   Sodium 138 135 - 145 mmol/L   Potassium 3.9 3.5 - 5.1 mmol/L   Chloride 104 101 - 111 mmol/L   CO2 25 22 - 32 mmol/L   Glucose, Bld 154 (H) 65 - 99 mg/dL   BUN 17 6 - 20 mg/dL   Creatinine, Ser 0.91 0.44 - 1.00 mg/dL   Calcium 9.4 8.9 - 10.3 mg/dL   Total Protein 7.7 6.5 - 8.1 g/dL   Albumin 3.9 3.5 - 5.0 g/dL   AST 18 15 - 41 U/L   ALT 17 14 - 54 U/L   Alkaline Phosphatase 58 38 - 126 U/L   Total Bilirubin 0.4 0.3 - 1.2 mg/dL   GFR calc non Af Amer >60 >60 mL/min   GFR calc Af Amer >60 >60 mL/min   Anion gap 9 5 - 15  CBC  Result Value Ref Range   WBC 11.2 (H) 4.0 - 10.5 K/uL   RBC 4.57 3.87 - 5.11 MIL/uL   Hemoglobin 12.3 12.0 - 15.0 g/dL   HCT 37.1 36.0 - 46.0 %   MCV 81.2 78.0 - 100.0 fL   MCH 26.9 26.0 - 34.0 pg   MCHC 33.2 30.0 - 36.0 g/dL   RDW 14.4 11.5 - 15.5 %   Platelets 276 150 - 400 K/uL  Urinalysis, Routine w reflex microscopic  Result Value Ref Range   Color, Urine YELLOW YELLOW   APPearance HAZY (A) CLEAR   Specific Gravity, Urine 1.028 1.005 - 1.030   pH 5.0 5.0 - 8.0   Glucose, UA NEGATIVE NEGATIVE mg/dL   Hgb urine dipstick LARGE (A) NEGATIVE   Bilirubin Urine NEGATIVE NEGATIVE   Ketones, ur NEGATIVE NEGATIVE  mg/dL   Protein, ur NEGATIVE NEGATIVE mg/dL   Nitrite NEGATIVE NEGATIVE   Leukocytes, UA NEGATIVE NEGATIVE   RBC / HPF 6-30 0 - 5 RBC/hpf   WBC, UA 0-5 0  - 5 WBC/hpf   Bacteria, UA RARE (A) NONE SEEN   Squamous Epithelial / LPF 6-30 (A) NONE SEEN   Mucous PRESENT    Hyaline Casts, UA PRESENT    Uric Acid Crys, UA PRESENT   POC urine preg, ED  Result Value Ref Range   Preg Test, Ur NEGATIVE NEGATIVE   US Transvaginal Non-ob  Result Date: 05/29/2016 SEE PROGRESS NOTE  Ct Renal Stone Study  Result Date: 06/22/2016 CLINICAL DATA:  RIGHT upper quadrant/periumbilical pain. Microscopic hematuria. History of hypertension, polycystic ovarian disease. EXAM: CT ABDOMEN AND PELVIS WITHOUT CONTRAST TECHNIQUE: Multidetector CT imaging of the abdomen and pelvis was performed following the standard protocol without IV contrast. COMPARISON:  CT abdomen and pelvis March 30, 2014 FINDINGS: LOWER CHEST: Lung bases are clear. The visualized heart size is normal. No pericardial effusion. HEPATOBILIARY: The liver is diffusely hypodense compatible with steatosis, otherwise unremarkable. , status post cholecystectomy. PANCREAS: Normal. SPLEEN: Normal. ADRENALS/URINARY TRACT: Kidneys are orthotopic, demonstrating normal size and morphology. No nephrolithiasis, hydronephrosis; limited assessment for renal masses on this nonenhanced examination. The unopacified ureters are normal in course and caliber. Urinary bladder is decompressed and unremarkable. Normal adrenal glands. STOMACH/BOWEL: The stomach, small and large bowel are normal in course and caliber without inflammatory changes, sensitivity decreased by lack of enteric contrast. Normal appendix. VASCULAR/LYMPHATIC: Aortoiliac vessels are normal in course and caliber. No lymphadenopathy by CT size criteria. Subcentimeter central mesenteric and retroperitoneal lymph nodes are likely reactive. REPRODUCTIVE: 3.8 x 4.9 x 6 cm homogeneous hypodense LEFT adnexal cyst. IUD in central uterus. OTHER: No intraperitoneal free fluid or free air. MUSCULOSKELETAL: Non-acute. Moderate L5-S1 degenerative disc and endplate spurring  resulting an mild LEFT L5-S1 neural foraminal narrowing. IMPRESSION: No urolithiasis, obstructive uropathy nor acute intra-abdominal/pelvic process. **An incidental finding of potential clinical significance has been found. 6 cm LEFT adnexal cyst. Recommend follow-up ultrasound at 6-12 weeks. This recommendation follows ACR consensus guidelines: White Paper of the ACR Incidental Findings Committee II on Adnexal Findings. J Am Coll Radiol [phone].** Electronically Signed   By: Elon Alas M.D.   On: 06/22/2016 01:49     DIAGNOSTIC STUDIES: Oxygen Saturation is 97% on RA, normal by my interpretation.   COORDINATION OF CARE: 11:36 PM-Discussed next steps with pt. Pt verbalized understanding and is agreeable with the plan.   Results for orders placed or performed during the hospital encounter of 06/21/16  Lipase, blood  Result Value Ref Range   Lipase 20 11 - 51 U/L  Comprehensive metabolic panel  Result Value Ref Range   Sodium 138 135 - 145 mmol/L   Potassium 3.9 3.5 - 5.1 mmol/L   Chloride 104 101 - 111 mmol/L   CO2 25 22 - 32 mmol/L   Glucose, Bld 154 (H) 65 - 99 mg/dL   BUN 17 6 - 20 mg/dL   Creatinine, Ser 0.91 0.44 - 1.00 mg/dL   Calcium 9.4 8.9 - 10.3 mg/dL   Total Protein 7.7 6.5 - 8.1 g/dL   Albumin 3.9 3.5 - 5.0 g/dL   AST 18 15 - 41 U/L   ALT 17 14 - 54 U/L   Alkaline Phosphatase 58 38 - 126 U/L   Total Bilirubin 0.4 0.3 - 1.2 mg/dL   GFR calc non Af Amer >60 >60  mL/min   GFR calc Af Amer >60 >60 mL/min   Anion gap 9 5 - 15  CBC  Result Value Ref Range   WBC 11.2 (H) 4.0 - 10.5 K/uL   RBC 4.57 3.87 - 5.11 MIL/uL   Hemoglobin 12.3 12.0 - 15.0 g/dL   HCT 37.1 36.0 - 46.0 %   MCV 81.2 78.0 - 100.0 fL   MCH 26.9 26.0 - 34.0 pg   MCHC 33.2 30.0 - 36.0 g/dL   RDW 14.4 11.5 - 15.5 %   Platelets 276 150 - 400 K/uL  Urinalysis, Routine w reflex microscopic  Result Value Ref Range   Color, Urine YELLOW YELLOW   APPearance HAZY (A) CLEAR   Specific  Gravity, Urine 1.028 1.005 - 1.030   pH 5.0 5.0 - 8.0   Glucose, UA NEGATIVE NEGATIVE mg/dL   Hgb urine dipstick LARGE (A) NEGATIVE   Bilirubin Urine NEGATIVE NEGATIVE   Ketones, ur NEGATIVE NEGATIVE mg/dL   Protein, ur NEGATIVE NEGATIVE mg/dL   Nitrite NEGATIVE NEGATIVE   Leukocytes, UA NEGATIVE NEGATIVE   RBC / HPF 6-30 0 - 5 RBC/hpf   WBC, UA 0-5 0 - 5 WBC/hpf   Bacteria, UA RARE (A) NONE SEEN   Squamous Epithelial / LPF 6-30 (A) NONE SEEN   Mucous PRESENT    Hyaline Casts, UA PRESENT    Uric Acid Crys, UA PRESENT   POC urine preg, ED  Result Value Ref Range   Preg Test, Ur NEGATIVE NEGATIVE   US Transvaginal Non-ob  Result Date: 05/29/2016 SEE PROGRESS NOTE    EKG Interpretation  Date/Time:    Ventricular Rate:    PR Interval:    QRS Duration:   QT Interval:    QTC Calculation:   R Axis:     Text Interpretation:         Procedures Procedures  Medications Ordered in ED  Medications  ketorolac (TORADOL) 30 MG/ML injection 30 mg (30 mg Intravenous Given 06/22/16 0043)  ondansetron (ZOFRAN) injection 4 mg (4 mg Intravenous Given 06/22/16 0044)     Final Clinical Impressions(s) / ED Diagnoses  Abdominal pain, right abdomen:  Suspect muscle pain.  Will treat with NSAIds.  Have given a note for work.  No driving.  All questions answered to patient's satisfaction. Based on history and exam patient has been appropriately medically screened and emergency conditions excluded. Patient is stable for discharge at this time. Strict return precautions given for any further episodes, persistent fever, weakness or any concerns.  I personally performed the services described in this documentation, which was scribed in my presence. The recorded information has been reviewed and is accurate.       Veatrice Kells, MD 06/22/16 [phone]

## 2016-06-21 NOTE — ED Triage Notes (Signed)
Pt presents with c/o abdominal pain that started around 2 am this morning. Pt also reports some nausea. Pt says that she normally has a bowel movement in the morning and did not have one today. Pt denies any vomiting.

## 2016-06-22 ENCOUNTER — Encounter (HOSPITAL_COMMUNITY): Payer: Self-pay | Admitting: Emergency Medicine

## 2016-06-22 ENCOUNTER — Emergency Department (HOSPITAL_COMMUNITY): Payer: 59

## 2016-06-22 DIAGNOSIS — Z79899 Other long term (current) drug therapy: Secondary | ICD-10-CM | POA: Diagnosis not present

## 2016-06-22 DIAGNOSIS — R3129 Other microscopic hematuria: Secondary | ICD-10-CM | POA: Diagnosis not present

## 2016-06-22 DIAGNOSIS — N83202 Unspecified ovarian cyst, left side: Secondary | ICD-10-CM | POA: Diagnosis not present

## 2016-06-22 DIAGNOSIS — Z87891 Personal history of nicotine dependence: Secondary | ICD-10-CM | POA: Diagnosis not present

## 2016-06-22 DIAGNOSIS — R1031 Right lower quadrant pain: Secondary | ICD-10-CM | POA: Diagnosis not present

## 2016-06-22 LAB — URINALYSIS, ROUTINE W REFLEX MICROSCOPIC
BILIRUBIN URINE: NEGATIVE
Glucose, UA: NEGATIVE mg/dL
KETONES UR: NEGATIVE mg/dL
Leukocytes, UA: NEGATIVE
Nitrite: NEGATIVE
PROTEIN: NEGATIVE mg/dL
Specific Gravity, Urine: 1.028 (ref 1.005–1.030)
pH: 5 (ref 5.0–8.0)

## 2016-06-22 MED ORDER — ONDANSETRON HCL 4 MG/2ML IJ SOLN
4.0000 mg | Freq: Once | INTRAMUSCULAR | Status: AC
  Administered 2016-06-22: 4 mg via INTRAVENOUS
  Filled 2016-06-22: qty 2

## 2016-06-22 MED ORDER — KETOROLAC TROMETHAMINE 30 MG/ML IJ SOLN
30.0000 mg | Freq: Once | INTRAMUSCULAR | Status: AC
  Administered 2016-06-22: 30 mg via INTRAVENOUS
  Filled 2016-06-22: qty 1

## 2016-06-22 MED ORDER — MELOXICAM 7.5 MG PO TABS: 7.5000 mg | ORAL_TABLET | Freq: Every day | ORAL | 0 refills | Status: DC

## 2016-07-16 MED FILL — CITALOPRAM HBR 20 MG TABLET: 20 | 90 days supply | Qty: 90 | Fill #0

## 2016-07-16 MED FILL — metroNIDAZOLE 500 MG TABS: 500 | 7 days supply | Qty: 14 | Fill #0

## 2016-07-24 ENCOUNTER — Ambulatory Visit (INDEPENDENT_AMBULATORY_CARE_PROVIDER_SITE_OTHER): Payer: 59

## 2016-07-24 ENCOUNTER — Encounter: Payer: Self-pay | Admitting: Obstetrics and Gynecology

## 2016-07-24 ENCOUNTER — Ambulatory Visit (INDEPENDENT_AMBULATORY_CARE_PROVIDER_SITE_OTHER): Payer: 59 | Admitting: Obstetrics and Gynecology

## 2016-07-24 VITALS — BP 120/82 | HR 60 | Resp 18 | Wt 312.0 lb

## 2016-07-24 DIAGNOSIS — R1032 Left lower quadrant pain: Secondary | ICD-10-CM | POA: Diagnosis not present

## 2016-07-24 DIAGNOSIS — N941 Unspecified dyspareunia: Secondary | ICD-10-CM | POA: Diagnosis not present

## 2016-07-24 DIAGNOSIS — N83202 Unspecified ovarian cyst, left side: Secondary | ICD-10-CM | POA: Diagnosis not present

## 2016-07-24 DIAGNOSIS — R102 Pelvic and perineal pain: Secondary | ICD-10-CM | POA: Diagnosis not present

## 2016-07-24 NOTE — Progress Notes (Signed)
GYNECOLOGY  VISIT   HPI: 36 y.o.   Married  Caucasian  female   [phone] with No LMP recorded. Patient is not currently having periods (Reason: IUD).   here for follow up left ovarian cyst. The patient has a long h/o intermittent LLQ abdominal/pelvic pain. Pain started in July of 2017, initially she was have a dull ache with stabbing compontents. Would last for a few days and then stop, no dyspareunia.  In November it was getting more consistent with more stabbing sensations on a daily basis in November. Last month the pain got worse and radiated to the right side.  Currently she is having pain every day, taking daily ibuprofen which helps. Pain without the ibuprofen is up to a 6-7/10, with the ibuprofen the pain is a 1-2/10 in severity. The dull pain is constant and intermittent sharp pain.  She had a mirena IUD placed in mid December for cycle control. Last month she started having pain with intercourse. The pain is deep inside, sometimes positional, often needs to stop having sex.  LMP was 06/22/16, lasted for 6 days, very light (big improvement). Spotted February 26 through March 4. Cramps are better since the first cycle off of the IUD.  She is getting headaches and some nausea with her cycle. Feels like a migraine (without aura).  She is working very hard to loose weight, working out  1 hour a day, 5 days a week. She has an appointment to see a Bariatric surgeon later this week.    GYNECOLOGIC HISTORY: No LMP recorded. Patient is not currently having periods (Reason: IUD). Contraception:IUD  Menopausal hormone therapy: none        OB History    Gravida Para Term Preterm AB Living   3 1 1   2 1    SAB TAB Ectopic Multiple Live Births   2       1         Patient Active Problem List   Diagnosis Date Noted  . LOW BACK PAIN, ACUTE 11/20/2006  . POLYCYSTIC OVARY 07/04/2006  . OBESITY, NOS 07/04/2006  . DEPRESSIVE DISORDER, NOS 07/04/2006  . TOBACCO USE, QUIT 07/04/2006    Past  Medical History:  Diagnosis Date  . Abnormal uterine bleeding   . Amenorrhea   . Anemia   . Anxiety    panic attacks  . Dysmenorrhea   . GERD (gastroesophageal reflux disease)   . Hyperlipidemia   . Infertility, female   . Insulin resistance   . Migraine without aura   . Obesity   . PCOS (polycystic ovarian syndrome)   . PCOS (polycystic ovarian syndrome)   . PONV (postoperative nausea and vomiting)   . Urinary incontinence     Past Surgical History:  Procedure Laterality Date  . CHOLECYSTECTOMY    . DILATATION & CURRETTAGE/HYSTEROSCOPY WITH RESECTOCOPE N/A 10/17/2012   Procedure: DILATATION & CURETTAGE/HYSTEROSCOPY WITH RESECTOCOPE; Possible Polypectomy, Possible Resectoscopic Myomectomy.;  Surgeon: Floyce Stakes. Pamala Hurry, MD;  Location: [facility];  Service: Gynecology;  Laterality: N/A;  1 hr.  Marland Kitchen DILATION AND CURETTAGE OF UTERUS    . ENDOMETRIAL BIOPSY    . HYSTEROSCOPY W/D&C N/A 12/01/2013   Procedure: IUD Removal;  Surgeon: Floyce Stakes. Pamala Hurry, MD;  Location: [facility];  Service: Gynecology;  Laterality: N/A;  90 min.  . INTRAUTERINE DEVICE (IUD) INSERTION N/A 10/17/2012   Procedure: INTRAUTERINE DEVICE (IUD) INSERTION; Mirena;  Surgeon: Floyce Stakes. Pamala Hurry, MD;  Location: [facility];  Service: Gynecology;  Laterality:  N/A;    Current Outpatient Prescriptions  Medication Sig Dispense Refill  . Biotin 5000 MCG CAPS Take 1 capsule by mouth daily.    . calcium carbonate (TUMS - DOSED IN MG ELEMENTAL CALCIUM) 500 MG chewable tablet Chew 3 tablets by mouth daily as needed for indigestion or heartburn.    . Cholecalciferol (D3-50) 50000 units capsule TAKE ONE CAPSULE BY MOUTH ONCE A WEEK FOR LOW VITAMIN D 12 capsule 3  . citalopram (CELEXA) 20 MG tablet Take 1 tablet po a day 90 tablet 3  . diazepam (VALIUM) 5 MG tablet Take 5 mg by mouth every 8 (eight) hours as needed for anxiety.    Marland Kitchen ibuprofen (ADVIL,MOTRIN) 200 MG tablet Take 400 mg by mouth every 6 (six) hours as needed for headache.    .  promethazine (PHENERGAN) 25 MG tablet Take 25 mg by mouth daily as needed for nausea or vomiting.     . ranitidine (ZANTAC) 150 MG tablet Take 150 mg by mouth daily as needed for heartburn.     . SUMAtriptan (IMITREX) 100 MG tablet Take 1 tablet (100 mg total) by mouth every 2 (two) hours as needed for migraine. May repeat in 2 hours if headache persists or recurs.  Take a maximum of 2 tablets per 24 hour period. 15 tablet 0  . VENTOLIN HFA 108 (90 Base) MCG/ACT inhaler Inhale 1-2 puffs into the lungs every 4 (four) hours as needed for wheezing.   0  . ferrous sulfate 325 (65 FE) MG tablet Take 325 mg by mouth every evening.     No current facility-administered medications for this visit.      ALLERGIES: Iohexol  Family History  Problem Relation Age of Onset  . Diabetes Father   . Thyroid disease Father   . Diabetes Maternal Grandmother   . Heart disease Maternal Grandmother   . Diabetes Maternal Grandfather   . Heart disease Maternal Grandfather   . Stroke Maternal Grandfather   . Diabetes Paternal Grandmother   . Diabetes Paternal Grandfather   . Heart disease Paternal Grandfather   . Diabetes Other   . Obesity Other   . Sleep apnea Other   . Hyperlipidemia Other   . Hypertension Other   . Cancer Other   . Heart disease Other     Social History   Social History  . Marital status: Married    Spouse name: N/A  . Number of children: N/A  . Years of education: N/A   Occupational History  . Not on file.   Social History Main Topics  . Smoking status: Former Smoker    Types: Cigarettes  . Smokeless tobacco: Never Used  . Alcohol use Yes     Comment: occasional  . Drug use: No  . Sexual activity: Yes    Partners: Male    Birth control/ protection: None   Other Topics Concern  . Not on file   Social History Narrative  . No narrative on file    ROS  PHYSICAL EXAMINATION:    BP 120/82 (BP Location: Right Arm, Patient Position: Sitting, Cuff Size: Normal)    Pulse 60   Resp 18   Wt (!) 312 lb (141.5 kg)   BMI 47.44 kg/m     General appearance: alert, cooperative and appears stated age Abdomen: soft, mildly tender in the left lower quadrant, no rebound, no guarding; bowel sounds normal; no masses,  no organomegaly  Pelvic: External genitalia:  no lesions  Urethra:  normal appearing urethra with no masses, tenderness or lesions              Bartholins and Skenes: normal                 Cervix: no cervical motion tenderness              Bimanual Exam:  Uterus:  not appreciably enlared, not tender              Adnexa: not enlarged, tender in the left adnexa.               Ultrasound images reviewed with the patient. She has a persistent, stable multicystic left adnexal mass. Benign appearing  ASSESSMENT Left abdominal pelvic pain since last summer, getting worse Now with deep dyspareunia Complex left adnexal mass, stable, benign in appearance H/O oligomenorrhea and abnormal uterine bleeding, better since IUD insertion in 12/17 IUD check, in place Obesity, exercising, trying to eat healthy, no weight loss. She has a consultation coming up with a bariatric surgeon    PLAN We discussed a trial of OCP's, she understands the risks and benefits and would prefer not to try OCP's We discussed the option of laparoscopy with possible left cystectomy, possible left salpingectomy and possible LSO. She understands that she could continue to have pain after surgery We discussed the risks of surgery, including risk of damage to bowel/bladder/ureters and vessels. Discussed the increased risk of the surgery and with anesthesia with her elevated BMI Discussed trying to loose weight prior to surgery We also discussed combining surgery or at least looking at her adnexa if she chooses to have gastric bypass surgery   An After Visit Summary was printed and given to the patient.  20 minutes face to face time of which over 50% was spent in counseling.

## 2016-07-27 ENCOUNTER — Telehealth: Payer: Self-pay

## 2016-07-27 ENCOUNTER — Other Ambulatory Visit (HOSPITAL_COMMUNITY): Payer: Self-pay | Admitting: Surgery

## 2016-07-27 MED ORDER — SUMATRIPTAN SUCCINATE 100 MG PO TABS: 100.0000 mg | ORAL_TABLET | Freq: Once | ORAL | 0 refills | Status: DC

## 2016-07-27 MED ORDER — PROMETHAZINE HCL 12.5 MG PO TABS: ORAL_TABLET | ORAL | 0 refills | Status: DC

## 2016-07-27 NOTE — Telephone Encounter (Signed)
Spoke with patient who states that she only has a couple tablets of Imitrex left. Requesting a refill of this rx as well. Per verbal review with Dr.Jertson okay to fill. Rx for Imitrex 100 mg take 1 tablet by mouth once. May repeat in 2 hours if headache persists or recurs. Take a maximum of 2 tablets in a 24 hour period #15 0RF sent to pharmacy on file.  Routing to [place] for review before closing.

## 2016-07-27 NOTE — Telephone Encounter (Signed)
Spoke with patient. Patient states that she is having ongoing headaches with nausea related to menses. Reports nausea has increased without vomiting. Denies any change with bowel movements. Denies any visual changes or light headedness. Patient is asking for refill on Phenergan for nausea. Advised will review with Dr.Jertson and notify of recommendations. Patient is agreeable.  Reviewed with Dr.Jertson. Okay to send in rx for Phenergan 12.5 mg take 1 tablet every 4-6 hours as needed for nausea #20 0RF. Per Dr.Jertson need to check with patient to see if she needs refill on Imitrex as well. If so okay to give refill per Dr.Jertson.

## 2016-08-17 MED FILL — VITAMIN D3 50000 UNIT CAPS: 1.25 MG | 84 days supply | Qty: 12 | Fill #1

## 2016-08-17 MED FILL — PROMETHAZINE 12.5 MG TABLET: 12.5 | 4 days supply | Qty: 20 | Fill #0

## 2016-08-17 NOTE — Telephone Encounter (Signed)
Dr.Jertson, okay to close encounter?

## 2016-08-20 NOTE — Telephone Encounter (Signed)
Will close encounter

## 2016-08-23 ENCOUNTER — Ambulatory Visit: Payer: 59 | Admitting: Obstetrics and Gynecology

## 2016-08-30 ENCOUNTER — Telehealth: Payer: Self-pay | Admitting: *Deleted

## 2016-08-30 NOTE — Telephone Encounter (Signed)
Confirmed surgery date of 10-30-16 ay [place] at 0730. Plan arrival time of 0600 unless hospital directs otherwise. Surgery instruction sheet reviewed and printed copy provided to patient,. Consult scheduled with Dr Talbert Nan for 10-10-16.  Routing to provider for final review. Patient agreeable to disposition. Will close encounter.

## 2016-09-24 ENCOUNTER — Ambulatory Visit: Payer: Self-pay | Admitting: Skilled Nursing Facility1

## 2016-09-27 ENCOUNTER — Telehealth: Payer: Self-pay | Admitting: Obstetrics and Gynecology

## 2016-09-27 NOTE — Telephone Encounter (Signed)
[employer] forms have been routed to Dr Talbert Nan for review

## 2016-10-04 NOTE — Telephone Encounter (Signed)
FMLA forms were completed and faxed on 09/27/16. I have forwarded documentation to Nurse Supervisor, Lamont Snowball, to keep this information with the surgery chart. Ok to close

## 2016-10-10 ENCOUNTER — Encounter: Payer: Self-pay | Admitting: Obstetrics and Gynecology

## 2016-10-10 ENCOUNTER — Ambulatory Visit (INDEPENDENT_AMBULATORY_CARE_PROVIDER_SITE_OTHER): Payer: 59 | Admitting: Obstetrics and Gynecology

## 2016-10-10 ENCOUNTER — Other Ambulatory Visit: Payer: Self-pay | Admitting: Obstetrics and Gynecology

## 2016-10-10 VITALS — BP 130/80 | HR 84 | Resp 16 | Wt 308.0 lb

## 2016-10-10 DIAGNOSIS — Z6841 Body Mass Index (BMI) 40.0 and over, adult: Secondary | ICD-10-CM

## 2016-10-10 DIAGNOSIS — Z01818 Encounter for other preprocedural examination: Secondary | ICD-10-CM

## 2016-10-10 DIAGNOSIS — R102 Pelvic and perineal pain: Secondary | ICD-10-CM

## 2016-10-10 DIAGNOSIS — N941 Unspecified dyspareunia: Secondary | ICD-10-CM

## 2016-10-10 DIAGNOSIS — R0683 Snoring: Secondary | ICD-10-CM

## 2016-10-10 DIAGNOSIS — R109 Unspecified abdominal pain: Secondary | ICD-10-CM

## 2016-10-10 DIAGNOSIS — Z975 Presence of (intrauterine) contraceptive device: Secondary | ICD-10-CM

## 2016-10-10 DIAGNOSIS — R7309 Other abnormal glucose: Secondary | ICD-10-CM | POA: Diagnosis not present

## 2016-10-10 NOTE — Progress Notes (Addendum)
GYNECOLOGY  VISIT   HPI: 36 y.o.   Married  Caucasian  female   [phone] with No LMP recorded. Patient is not currently having periods (Reason: IUD).   here for surgery consult. Patient c/o loss of sexual interest and pain and spotting with intercourse. The patient has a history of chronic LLQ abdominal pelvic pain with a stable multicystic benign appearing adnexal mass. She has a h/o oligomenorrhea and abnormal uterine bleeding and had a mirena IUD placed in 12/17 (in place on u/s in 3/18). She started having pain after intercourse prior to the IUD insertion in 12/17, getting worse. Spotting after intercourse.  No cycles with the IUD.  She is planning on bariatric surgery at the end of the year (sleeve gastrectomy). Working out 4 days a week, changed her diet.  She is down 4 lbs since 3/18.  She does snore, never evaluated for sleep apnea. Can lie flat.  In 11/17 she had a HgbA1C of 6.5%. She hasn't established care with a primary MD yet (the MD she was trying to see was out on leave). States recent HgbA1C was 7.2% with Bariatric surgeon.   GYNECOLOGIC HISTORY: No LMP recorded. Patient is not currently having periods (Reason: IUD). Contraception:IUD Menopausal hormone therapy: none         OB History    Gravida Para Term Preterm AB Living   3 1 1   2 1    SAB TAB Ectopic Multiple Live Births   2       1         Patient Active Problem List   Diagnosis Date Noted  . LOW BACK PAIN, ACUTE 11/20/2006  . POLYCYSTIC OVARY 07/04/2006  . OBESITY, NOS 07/04/2006  . DEPRESSIVE DISORDER, NOS 07/04/2006  . TOBACCO USE, QUIT 07/04/2006    Past Medical History:  Diagnosis Date  . Abnormal uterine bleeding   . Amenorrhea   . Anemia   . Anxiety    panic attacks  . Dysmenorrhea   . GERD (gastroesophageal reflux disease)   . Hyperlipidemia   . Infertility, female   . Insulin resistance   . Migraine without aura   . Obesity   . PCOS (polycystic ovarian syndrome)   . PCOS (polycystic  ovarian syndrome)   . PONV (postoperative nausea and vomiting)   . Urinary incontinence     Past Surgical History:  Procedure Laterality Date  . CHOLECYSTECTOMY    . DILATATION & CURRETTAGE/HYSTEROSCOPY WITH RESECTOCOPE N/A 10/17/2012   Procedure: DILATATION & CURETTAGE/HYSTEROSCOPY WITH RESECTOCOPE; Possible Polypectomy, Possible Resectoscopic Myomectomy.;  Surgeon: Floyce Stakes. Pamala Hurry, MD;  Location: [facility];  Service: Gynecology;  Laterality: N/A;  1 hr.  Marland Kitchen DILATION AND CURETTAGE OF UTERUS    . ENDOMETRIAL BIOPSY    . HYSTEROSCOPY W/D&C N/A 12/01/2013   Procedure: IUD Removal;  Surgeon: Floyce Stakes. Pamala Hurry, MD;  Location: [facility];  Service: Gynecology;  Laterality: N/A;  90 min.  . INTRAUTERINE DEVICE (IUD) INSERTION N/A 10/17/2012   Procedure: INTRAUTERINE DEVICE (IUD) INSERTION; Mirena;  Surgeon: Floyce Stakes. Pamala Hurry, MD;  Location: [facility];  Service: Gynecology;  Laterality: N/A;    Current Outpatient Prescriptions  Medication Sig Dispense Refill  . Biotin 5000 MCG CAPS Take 1 capsule by mouth daily.    . calcium carbonate (TUMS - DOSED IN MG ELEMENTAL CALCIUM) 500 MG chewable tablet Chew 3 tablets by mouth daily as needed for indigestion or heartburn.    . Cholecalciferol (D3-50) 50000 units capsule TAKE ONE CAPSULE BY MOUTH  ONCE A WEEK FOR LOW VITAMIN D 12 capsule 3  . citalopram (CELEXA) 20 MG tablet Take 1 tablet po a day 90 tablet 3  . diazepam (VALIUM) 5 MG tablet Take 5 mg by mouth every 8 (eight) hours as needed for anxiety.    Marland Kitchen ibuprofen (ADVIL,MOTRIN) 200 MG tablet Take 400 mg by mouth every 6 (six) hours as needed for headache.    Ernestine Conrad Fatty Acids-Vitamins (OMEGA-3 GUMMIES) CHEW Chew by mouth.    . Probiotic Product (DIGESTIVE ADVANTAGE GUMMIES) CHEW Chew by mouth.    . promethazine (PHENERGAN) 12.5 MG tablet Take 1 tablet (12.5 mg total) every 4-6 hours as needed for nausea. 20 tablet 0  . promethazine (PHENERGAN) 25 MG tablet Take 25 mg by mouth daily as needed for nausea or  vomiting.     . SUMAtriptan (IMITREX) 100 MG tablet Take 1 tablet (100 mg total) by mouth once. May repeat in 2 hours if headache persists or recurs.  Take a maximum of 2 tablets per 24 hour period. 15 tablet 0  . VENTOLIN HFA 108 (90 Base) MCG/ACT inhaler Inhale 1-2 puffs into the lungs every 4 (four) hours as needed for wheezing.   0   No current facility-administered medications for this visit.      ALLERGIES: Iohexol  Family History  Problem Relation Age of Onset  . Diabetes Father   . Thyroid disease Father   . Diabetes Maternal Grandmother   . Heart disease Maternal Grandmother   . Diabetes Maternal Grandfather   . Heart disease Maternal Grandfather   . Stroke Maternal Grandfather   . Diabetes Paternal Grandmother   . Diabetes Paternal Grandfather   . Heart disease Paternal Grandfather   . Diabetes Other   . Obesity Other   . Sleep apnea Other   . Hyperlipidemia Other   . Hypertension Other   . Cancer Other   . Heart disease Other     Social History   Social History  . Marital status: Married    Spouse name: N/A  . Number of children: N/A  . Years of education: N/A   Occupational History  . Not on file.   Social History Main Topics  . Smoking status: Former Smoker    Types: Cigarettes  . Smokeless tobacco: Never Used  . Alcohol use Yes     Comment: occasional  . Drug use: No  . Sexual activity: Yes    Partners: Male    Birth control/ protection: None   Other Topics Concern  . Not on file   Social History Narrative  . No narrative on file    Review of Systems  Constitutional: Negative.   HENT: Negative.   Eyes: Negative.   Respiratory: Negative.   Cardiovascular: Negative.   Gastrointestinal: Negative.   Genitourinary:       Loss of sexual interest  Pain and spotting with intercourse  Musculoskeletal: Negative.   Skin: Negative.   Neurological: Positive for headaches.  Endo/Heme/Allergies: Negative.   Psychiatric/Behavioral: Negative.      PHYSICAL EXAMINATION:    BP 130/80 (BP Location: Right Arm, Patient Position: Sitting, Cuff Size: Normal)   Pulse 84   Resp 16   Wt (!) 308 lb (139.7 kg)   BMI 46.83 kg/m     General appearance: alert, cooperative and appears stated age Neck: no adenopathy, supple, symmetrical, trachea midline and thyroid normal to inspection and palpation Heart: regular rate and rhythm Lungs: CTAB Abdomen: soft, non-tender; bowel sounds normal;  no masses,  no organomegaly Extremities: normal, atraumatic, no cyanosis Skin: normal color, texture and turgor, no rashes or lesions Lymph: normal cervical supraclavicular and inguinal nodes Neurologic: grossly normal  She is comfortable flat on her back    ASSESSMENT Chronic left lower quadrant pelvic and abdominal pain Cystic left adnexal mass, stable Dyspareunia Elevated glucose, last HgbA1C was 6.5% in 11/17, reports HgbA1C in early May of 7.2% (c/w diabetes mellitus) Obesity    PLAN Diagnostic laparoscopy, possible left ovarian cystectomy, possible salpingectomy, possible LSO Risks of laparoscopy were reviewed, including but not limited to: infection, bleeding, need for laparotomy, damage to nearby organs (bowel, bladder, vessels and ureters) She is aware her  Pain could persist after surgery Will set her up to see primary MD I explained to her that if her HgbA1C is over 7, we will need to postpone her surgery Will set her up for a sleep study, risk of sleep apnea We discussed a possible bowel prep, she goes from constipation to diarrhea, if constipated then would do the prep Working of weight loss, seeing a nutritionist   An After Visit Summary was printed and given to the patient.

## 2016-10-11 LAB — COMPREHENSIVE METABOLIC PANEL
A/G RATIO: 1.2 (ref 1.2–2.2)
ALBUMIN: 4 g/dL (ref 3.5–5.5)
ALK PHOS: 70 IU/L (ref 39–117)
ALT: 19 IU/L (ref 0–32)
AST: 15 IU/L (ref 0–40)
BILIRUBIN TOTAL: 0.3 mg/dL (ref 0.0–1.2)
BUN / CREAT RATIO: 18 (ref 9–23)
BUN: 15 mg/dL (ref 6–20)
CHLORIDE: 100 mmol/L (ref 96–106)
CO2: 23 mmol/L (ref 18–29)
Calcium: 9.6 mg/dL (ref 8.7–10.2)
Creatinine, Ser: 0.83 mg/dL (ref 0.57–1.00)
GFR calc Af Amer: 106 mL/min/{1.73_m2} (ref >59)
GFR calc non Af Amer: 92 mL/min/{1.73_m2} (ref >59)
Globulin, Total: 3.3 g/dL (ref 1.5–4.5)
Glucose: 123 mg/dL — ABNORMAL HIGH (ref 65–99)
POTASSIUM: 4.5 mmol/L (ref 3.5–5.2)
Sodium: 139 mmol/L (ref 134–144)
Total Protein: 7.3 g/dL (ref 6.0–8.5)

## 2016-10-11 LAB — CBC
HEMATOCRIT: 39.7 % (ref 34.0–46.6)
Hemoglobin: 13.2 g/dL (ref 11.1–15.9)
MCH: 26.8 pg (ref 26.6–33.0)
MCHC: 33.2 g/dL (ref 31.5–35.7)
MCV: 81 fL (ref 79–97)
PLATELETS: 320 10*3/uL (ref 150–379)
RBC: 4.92 x10E6/uL (ref 3.77–5.28)
RDW: 15.2 % (ref 12.3–15.4)
WBC: 10.3 10*3/uL (ref 3.4–10.8)

## 2016-10-11 LAB — HEMOGLOBIN A1C
ESTIMATED AVERAGE GLUCOSE: 160 mg/dL
HEMOGLOBIN A1C: 7.2 % — AB (ref 4.8–5.6)

## 2016-10-18 ENCOUNTER — Encounter: Payer: Self-pay | Admitting: Adult Health

## 2016-10-18 ENCOUNTER — Ambulatory Visit (INDEPENDENT_AMBULATORY_CARE_PROVIDER_SITE_OTHER): Payer: 59 | Admitting: Adult Health

## 2016-10-18 DIAGNOSIS — E119 Type 2 diabetes mellitus without complications: Secondary | ICD-10-CM | POA: Diagnosis not present

## 2016-10-18 DIAGNOSIS — Z0000 Encounter for general adult medical examination without abnormal findings: Secondary | ICD-10-CM | POA: Diagnosis not present

## 2016-10-18 MED ORDER — DIAZEPAM 5 MG PO TABS: 5.0000 mg | ORAL_TABLET | Freq: Three times a day (TID) | ORAL | 0 refills | Status: DC | PRN

## 2016-10-18 MED ORDER — METFORMIN HCL 500 MG PO TABS: 500.0000 mg | ORAL_TABLET | Freq: Two times a day (BID) | ORAL | 3 refills | Status: DC

## 2016-10-18 MED ORDER — BLOOD GLUCOSE MONITOR KIT: PACK | 0 refills | Status: DC

## 2016-10-18 NOTE — Assessment & Plan Note (Signed)
Wt today 309 Since Jan 2018- Cardio with wt training 45-90 mins 3-5 days/week Following low CHO/Sugar diet. Drinking >100 ounces water/daily.

## 2016-10-18 NOTE — Progress Notes (Signed)
Subjective:    [name], female    DOB: 09/06/1980, 36 y.o.   MRN: [id number]  HPI:  [name] [name] is here to establish as a new pt.  She is a very pleasant 36 year old female. PMH: Morbid Obesity, Depression/Anxiety, Migraine HA with nausea, and PCOS.  She was scheduled to have gynecological procedure (removal of L ovary cyst) at end of this month, however due to detection of Diabetes (10/10/2016 A1c 7.2) procedure has been suspended until BS normalizes.   Since Jan 2018- Cardio with wt training 45-90 mins 3-5 days/week Following low CHO/Sugar diet. Drinking >100 ounces water/daily. She has long standing hx of depression/anxiety-sx's well managed by Citalopram '20mg'$  daily and Diazepam '5mg'$  PRN and CBT. She is married, with one child who is 46. 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  Patient Care Team    Relationship Specialty Notifications Start End  Velna Hatchet, MD PCP - General Internal Medicine  12/01/13     Patient Active Problem List   Diagnosis Date Noted  . Healthcare maintenance 10/18/2016  . Type 2 diabetes mellitus without complications ([place]) 77/82/4235  . LOW BACK PAIN, ACUTE 11/20/2006  . POLYCYSTIC OVARY 07/04/2006  . OBESITY, NOS 07/04/2006  . DEPRESSIVE DISORDER, NOS 07/04/2006  . TOBACCO USE, QUIT 07/04/2006     Past Medical History:  Diagnosis Date  . Abnormal uterine bleeding   . Amenorrhea   . Anemia   . Anxiety    panic attacks  . Dysmenorrhea   . GERD (gastroesophageal reflux disease)   . Hyperlipidemia   . Infertility, female   . Insulin resistance   . Migraine without aura   . Obesity   . PCOS (polycystic ovarian syndrome)   . PCOS (polycystic ovarian syndrome)   . PONV (postoperative nausea and vomiting)   . Urinary incontinence      Past Surgical History:  Procedure Laterality Date  . CHOLECYSTECTOMY    . DILATATION & CURRETTAGE/HYSTEROSCOPY WITH RESECTOCOPE N/A 10/17/2012   Procedure: DILATATION  & CURETTAGE/HYSTEROSCOPY WITH RESECTOCOPE; Possible Polypectomy, Possible Resectoscopic Myomectomy.;  Surgeon: Floyce Stakes. Pamala Hurry, MD;  Location: [facility];  Service: Gynecology;  Laterality: N/A;  1 hr.  Marland Kitchen DILATION AND CURETTAGE OF UTERUS    . ENDOMETRIAL BIOPSY    . HYSTEROSCOPY W/D&C N/A 12/01/2013   Procedure: IUD Removal;  Surgeon: Floyce Stakes. Pamala Hurry, MD;  Location: [facility];  Service: Gynecology;  Laterality: N/A;  90 min.  . INTRAUTERINE DEVICE (IUD) INSERTION N/A 10/17/2012   Procedure: INTRAUTERINE DEVICE (IUD) INSERTION; Mirena;  Surgeon: Floyce Stakes. Pamala Hurry, MD;  Location: [facility];  Service: Gynecology;  Laterality: N/A;  . MOUTH SURGERY     wisdom teeth     Family History  Problem Relation Age of Onset  . Diabetes Father   . Thyroid disease Father   . Hypertension Father   . Diabetes Maternal Grandmother   . Heart disease Maternal Grandmother   . Diabetes Maternal Grandfather   . Heart disease Maternal Grandfather   . Stroke Maternal Grandfather   . Diabetes Paternal Grandmother   . Diabetes Paternal Grandfather   . Heart disease Paternal Grandfather   . Alcohol abuse Paternal Grandfather   . Diabetes Other   . Obesity Other   . Sleep apnea Other   . Hyperlipidemia Other   . Hypertension Other   . Cancer Other   . Heart disease Other   . Hypertension Mother  History  Drug Use No     History  Alcohol Use  . Yes    Comment: occasional     History  Smoking Status  . Former Smoker  . Types: Cigarettes  Smokeless Tobacco  . Never Used     Outpatient Encounter Prescriptions as of 10/18/2016  Medication Sig  . calcium carbonate (TUMS - DOSED IN MG ELEMENTAL CALCIUM) 500 MG chewable tablet Chew 3 tablets by mouth daily as needed for indigestion or heartburn.  . Cholecalciferol (D3-50) 50000 units capsule TAKE ONE CAPSULE BY MOUTH ONCE A WEEK FOR LOW VITAMIN D  . citalopram (CELEXA) 20 MG tablet Take 1 tablet po a day  . diazepam (VALIUM) 5 MG tablet Take 1  tablet (5 mg total) by mouth every 8 (eight) hours as needed for anxiety.  Marland Kitchen ibuprofen (ADVIL,MOTRIN) 200 MG tablet Take 400 mg by mouth every 6 (six) hours as needed for headache.  . levonorgestrel (MIRENA) 20 MCG/24HR IUD 1 each by Intrauterine route once.  Ernestine Conrad Fatty Acids-Vitamins (OMEGA-3 GUMMIES) CHEW Chew by mouth.  . Probiotic Product (DIGESTIVE ADVANTAGE GUMMIES) CHEW Chew by mouth.  . promethazine (PHENERGAN) 12.5 MG tablet Take 1 tablet (12.5 mg total) every 4-6 hours as needed for nausea.  . SUMAtriptan (IMITREX) 100 MG tablet Take 1 tablet (100 mg total) by mouth once. May repeat in 2 hours if headache persists or recurs.  Take a maximum of 2 tablets per 24 hour period.  . VENTOLIN HFA 108 (90 Base) MCG/ACT inhaler Inhale 1-2 puffs into the lungs every 4 (four) hours as needed for wheezing.   . [DISCONTINUED] diazepam (VALIUM) 5 MG tablet Take 5 mg by mouth every 8 (eight) hours as needed for anxiety.  . blood glucose meter kit and supplies KIT Dispense based on patient and insurance preference. Use up to four times daily as directed. (FOR ICD-9 250.00, 250.01).  . metFORMIN (GLUCOPHAGE) 500 MG tablet Take 1 tablet (500 mg total) by mouth 2 (two) times daily with a meal. Start by just taking 1 tablet with dinner for the first two weeks, then twice daily.  . [DISCONTINUED] Biotin 5000 MCG CAPS Take 1 capsule by mouth daily.  . [DISCONTINUED] promethazine (PHENERGAN) 25 MG tablet Take 25 mg by mouth daily as needed for nausea or vomiting.    No facility-administered encounter medications on file as of 10/18/2016.     Allergies: Iohexol  Body mass index is 47.12 kg/m.  Blood pressure 109/73, pulse 70, height '5\' 8"'$  (1.727 m), weight (!) 309 lb 14.4 oz (140.6 kg).   Review of Systems  Constitutional: Negative for activity change, appetite change, chills, diaphoresis, fever and unexpected weight change.  Eyes: Negative for visual disturbance.  Respiratory: Negative for cough,  chest tightness, shortness of breath, wheezing and stridor.   Cardiovascular: Negative for chest pain, palpitations and leg swelling.  Gastrointestinal: Negative for abdominal distention, abdominal pain, blood in stool, constipation, diarrhea, nausea and vomiting.  Endocrine: Negative for cold intolerance, heat intolerance, polydipsia, polyphagia and polyuria.  Genitourinary: Negative for difficulty urinating and flank pain.  Musculoskeletal: Negative for arthralgias, back pain, gait problem, joint swelling, myalgias, neck pain and neck stiffness.  Skin: Negative for color change, pallor, rash and wound.  Neurological: Positive for headaches. Negative for dizziness.       Reports 1 migraine every 1-2 months.  She very rarely needs sumatriptan '100mg'$ .  Psychiatric/Behavioral: Positive for dysphoric mood. Negative for agitation, behavioral problems, decreased concentration, self-injury and sleep disturbance. The  patient is nervous/anxious.        Objective:   Physical Exam  Constitutional: She is oriented to person, place, and time. She appears well-developed and well-nourished. No distress.  HENT:  Head: Normocephalic and atraumatic.  Right Ear: External ear normal.  Left Ear: External ear normal.  Eyes: Conjunctivae are normal. Pupils are equal, round, and reactive to light.  Neck: Normal range of motion. Neck supple.  Cardiovascular: Normal rate, regular rhythm, normal heart sounds and intact distal pulses.   Pulmonary/Chest: Effort normal and breath sounds normal. No respiratory distress. She has no wheezes. She has no rales. She exhibits no tenderness.  Lymphadenopathy:    She has no cervical adenopathy.  Neurological: She is alert and oriented to person, place, and time. Coordination normal.  Skin: Skin is warm and dry. No rash noted. She is not diaphoretic. No erythema. No pallor.  Psychiatric: She has a normal mood and affect. Her behavior is normal. Judgment and thought content  normal.  Nursing note and vitals reviewed.         Assessment & Plan:   1. Healthcare maintenance   2. Type 2 diabetes mellitus without complication, without long-term current use of insulin ([place])     Healthcare maintenance Continue excellent exercise regime and healthy eating. Follow-up in 3 months, will re-check A1c at that time.   Type 2 diabetes mellitus without complications ([place]) 10/06/4707- A1c 7.2 Start Metformin '500mg'$ -take with dinner only for the first two weeks, then take with breakfast and dinner. BS checks per guide. Will re-check A1c 01/2017  OBESITY, NOS Wt today 309 Since Jan 2018- Cardio with wt training 45-90 mins 3-5 days/week Following low CHO/Sugar diet. Drinking >100 ounces water/daily.   DEPRESSIVE DISORDER, NOS Sx's well managed with Citalopram 20 mg daily with Diazepam '5mg'$  PRN. In counseling with husband and is establishing with individual therapist soon.    FOLLOW-UP:  Return in about 3 months (around 01/18/2017) for Regular Follow Up, Diabetes.

## 2016-10-18 NOTE — Patient Instructions (Signed)
Diabetes Mellitus and Exercise Exercising regularly is important for your overall health, especially when you have diabetes (diabetes mellitus). Exercising is not only about losing weight. It has many health benefits, such as increasing muscle strength and bone density and reducing body fat and stress. This leads to improved fitness, flexibility, and endurance, all of which result in better overall health. Exercise has additional benefits for people with diabetes, including:  Reducing appetite.  Helping to lower and control blood glucose.  Lowering blood pressure.  Helping to control amounts of fatty substances (lipids) in the blood, such as cholesterol and triglycerides.  Helping the body to respond better to insulin (improving insulin sensitivity).  Reducing how much insulin the body needs.  Decreasing the risk for heart disease by: ? Lowering cholesterol and triglyceride levels. ? Increasing the levels of good cholesterol. ? Lowering blood glucose levels.  What is my activity plan? Your health care provider or certified diabetes educator can help you make a plan for the type and frequency of exercise (activity plan) that works for you. Make sure that you:  Do at least 150 minutes of moderate-intensity or vigorous-intensity exercise each week. This could be brisk walking, biking, or water aerobics. ? Do stretching and strength exercises, such as yoga or weightlifting, at least 2 times a week. ? Spread out your activity over at least 3 days of the week.  Get some form of physical activity every day. ? Do not go more than 2 days in a row without some kind of physical activity. ? Avoid being inactive for more than 90 minutes at a time. Take frequent breaks to walk or stretch.  Choose a type of exercise or activity that you enjoy, and set realistic goals.  Start slowly, and gradually increase the intensity of your exercise over time.  What do I need to know about managing my  diabetes?  Check your blood glucose before and after exercising. ? If your blood glucose is higher than 240 mg/dL (13.3 mmol/L) before you exercise, check your urine for ketones. If you have ketones in your urine, do not exercise until your blood glucose returns to normal.  Know the symptoms of low blood glucose (hypoglycemia) and how to treat it. Your risk for hypoglycemia increases during and after exercise. Common symptoms of hypoglycemia can include: ? Hunger. ? Anxiety. ? Sweating and feeling clammy. ? Confusion. ? Dizziness or feeling light-headed. ? Increased heart rate or palpitations. ? Blurry vision. ? Tingling or numbness around the mouth, lips, or tongue. ? Tremors or shakes. ? Irritability.  Keep a rapid-acting carbohydrate snack available before, during, and after exercise to help prevent or treat hypoglycemia.  Avoid injecting insulin into areas of the body that are going to be exercised. For example, avoid injecting insulin into: ? The arms, when playing tennis. ? The legs, when jogging.  Keep records of your exercise habits. Doing this can help you and your health care provider adjust your diabetes management plan as needed. Write down: ? Food that you eat before and after you exercise. ? Blood glucose levels before and after you exercise. ? The type and amount of exercise you have done. ? When your insulin is expected to peak, if you use insulin. Avoid exercising at times when your insulin is peaking.  When you start a new exercise or activity, work with your health care provider to make sure the activity is safe for you, and to adjust your insulin, medicines, or food intake as needed.    Drink plenty of water while you exercise to prevent dehydration or heat stroke. Drink enough fluid to keep your urine clear or pale yellow. This information is not intended to replace advice given to you by your health care provider. Make sure you discuss any questions you have with  your health care provider. Document Released: 07/14/2003 Document Revised: 11/11/2015 Document Reviewed: 10/03/2015 Elsevier Interactive Patient Education  2018 [employer].   Blood Glucose Monitoring, Adult Monitoring your blood sugar (glucose) helps you manage your diabetes. It also helps you and your health care provider determine how well your diabetes management plan is working. Blood glucose monitoring involves checking your blood glucose as often as directed, and keeping a record (log) of your results over time. Why should I monitor my blood glucose? Checking your blood glucose regularly can:  Help you understand how food, exercise, illnesses, and medicines affect your blood glucose.  Let you know what your blood glucose is at any time. You can quickly tell if you are having low blood glucose (hypoglycemia) or high blood glucose (hyperglycemia).  Help you and your health care provider adjust your medicines as needed.  When should I check my blood glucose? Follow instructions from your health care provider about how often to check your blood glucose. This may depend on:  The type of diabetes you have.  How well-controlled your diabetes is.  Medicines you are taking.  If you have type 1 diabetes:  Check your blood glucose at least 2 times a day.  Also check your blood glucose: ? Before every insulin injection. ? Before and after exercise. ? Between meals. ? 2 hours after a meal. ? Occasionally between 2:00 a.m. and 3:00 a.m., as directed. ? Before potentially dangerous tasks, like driving or using heavy machinery. ? At bedtime.  You may need to check your blood glucose more often, up to 6-10 times a day: ? If you use an insulin pump. ? If you need multiple daily injections (MDI). ? If your diabetes is not well-controlled. ? If you are ill. ? If you have a history of severe hypoglycemia. ? If you have a history of not knowing when your blood glucose is getting low  (hypoglycemia unawareness). If you have type 2 diabetes:  If you take insulin or other diabetes medicines, check your blood glucose at least 2 times a day.  If you are on intensive insulin therapy, check your blood glucose at least 4 times a day. Occasionally, you may also need to check between 2:00 a.m. and 3:00 a.m., as directed.  Also check your blood glucose: ? Before and after exercise. ? Before potentially dangerous tasks, like driving or using heavy machinery.  You may need to check your blood glucose more often if: ? Your medicine is being adjusted. ? Your diabetes is not well-controlled. ? You are ill. What is a blood glucose log?  A blood glucose log is a record of your blood glucose readings. It helps you and your health care provider: ? Look for patterns in your blood glucose over time. ? Adjust your diabetes management plan as needed.  Every time you check your blood glucose, write down your result and notes about things that may be affecting your blood glucose, such as your diet and exercise for the day.  Most glucose meters store a record of glucose readings in the meter. Some meters allow you to download your records to a computer. How do I check my blood glucose? Follow these steps to  get accurate readings of your blood glucose: Supplies needed   Blood glucose meter.  Test strips for your meter. Each meter has its own strips. You must use the strips that come with your meter.  A needle to prick your finger (lancet). Do not use lancets more than once.  A device that holds the lancet (lancing device).  A journal or log book to write down your results. Procedure  Wash your hands with soap and water.  Prick the side of your finger (not the tip) with the lancet. Use a different finger each time.  Gently rub the finger until a small drop of blood appears.  Follow instructions that come with your meter for inserting the test strip, applying blood to the strip,  and using your blood glucose meter.  Write down your result and any notes. Alternative testing sites  Some meters allow you to use areas of your body other than your finger (alternative sites) to test your blood.  If you think you may have hypoglycemia, or if you have hypoglycemia unawareness, do not use alternative sites. Use your finger instead.  Alternative sites may not be as accurate as the fingers, because blood flow is slower in these areas. This means that the result you get may be delayed, and it may be different from the result that you would get from your finger.  The most common alternative sites are: ? Forearm. ? Thigh. ? Palm of the hand. Additional tips  Always keep your supplies with you.  If you have questions or need help, all blood glucose meters have a 24-hour "hotline" number that you can call. You may also contact your health care provider.  After you use a few boxes of test strips, adjust (calibrate) your blood glucose meter by following instructions that came with your meter. This information is not intended to replace advice given to you by your health care provider. Make sure you discuss any questions you have with your health care provider. Document Released: 04/26/2003 Document Revised: 11/11/2015 Document Reviewed: 10/03/2015 Elsevier Interactive Patient Education  2017 [place] and Valium as directed. Start Metformin 500mg -take with dinner only for the first two weeks, then take with breakfast and dinner. Please check blood sugar as directed above. Continue excellent exercise regime and healthy eating. Follow-up in 3 months, will re-check A1c at that time. Please call clinic with any questions/concerns. WELCOME TO THE PRACTICE!

## 2016-10-18 NOTE — Assessment & Plan Note (Signed)
Continue excellent exercise regime and healthy eating. Follow-up in 3 months, will re-check A1c at that time.

## 2016-10-18 NOTE — Assessment & Plan Note (Signed)
10/10/2016- A1c 7.2 Start Metformin 500mg -take with dinner only for the first two weeks, then take with breakfast and dinner. BS checks per guide. Will re-check A1c 01/2017

## 2016-10-18 NOTE — Assessment & Plan Note (Signed)
Sx's well managed with Citalopram 20 mg daily with Diazepam 5mg  PRN. In counseling with husband and is establishing with individual therapist soon.

## 2016-10-19 MED FILL — diazePAM 5 MG TABS: 5 | 20 days supply | Qty: 60 | Fill #0

## 2016-10-19 MED FILL — FREESTYLE LITE TEST STRIP: 50 days supply | Qty: 200 | Fill #0

## 2016-10-19 MED FILL — FREESTYLE LITE METER: 30 days supply | Qty: 1 | Fill #0

## 2016-10-19 MED FILL — metFORMIN HCL 500 MG TABS: 500 | 90 days supply | Qty: 180 | Fill #0

## 2016-10-19 MED FILL — FREESTYLE LANCETS: 50 days supply | Qty: 200 | Fill #0

## 2016-10-22 ENCOUNTER — Inpatient Hospital Stay (HOSPITAL_COMMUNITY)
Admission: RE | Admit: 2016-10-22 | Discharge: 2016-10-22 | Disposition: A | Discharge status: Home / Self Care | Payer: Self-pay | Source: Ambulatory Visit

## 2016-10-23 ENCOUNTER — Ambulatory Visit: Payer: Self-pay | Admitting: Family Medicine

## 2016-10-24 ENCOUNTER — Encounter: Payer: Self-pay | Admitting: Obstetrics and Gynecology

## 2016-10-24 ENCOUNTER — Ambulatory Visit (INDEPENDENT_AMBULATORY_CARE_PROVIDER_SITE_OTHER): Payer: 59 | Admitting: Obstetrics and Gynecology

## 2016-10-24 ENCOUNTER — Telehealth: Payer: Self-pay | Admitting: Obstetrics and Gynecology

## 2016-10-24 VITALS — BP 130/80 | HR 72 | Resp 18 | Wt 310.0 lb

## 2016-10-24 DIAGNOSIS — R109 Unspecified abdominal pain: Secondary | ICD-10-CM

## 2016-10-24 DIAGNOSIS — R102 Pelvic and perineal pain: Secondary | ICD-10-CM | POA: Diagnosis not present

## 2016-10-24 LAB — CBC WITH DIFFERENTIAL/PLATELET
BASOS ABS: 0 10*3/uL (ref 0.0–0.2)
BASOS: 0 %
EOS (ABSOLUTE): 0.1 10*3/uL (ref 0.0–0.4)
Eos: 1 %
Hematocrit: 38.5 % (ref 34.0–46.6)
Hemoglobin: 12.9 g/dL (ref 11.1–15.9)
IMMATURE GRANULOCYTES: 0 %
Immature Grans (Abs): 0 10*3/uL (ref 0.0–0.1)
Lymphocytes Absolute: 2.5 10*3/uL (ref 0.7–3.1)
Lymphs: 23 %
MCH: 27.2 pg (ref 26.6–33.0)
MCHC: 33.5 g/dL (ref 31.5–35.7)
MCV: 81 fL (ref 79–97)
MONOS ABS: 0.5 10*3/uL (ref 0.1–0.9)
Monocytes: 5 %
NEUTROS PCT: 71 %
Neutrophils Absolute: 7.6 10*3/uL — ABNORMAL HIGH (ref 1.4–7.0)
PLATELETS: 320 10*3/uL (ref 150–379)
RBC: 4.75 x10E6/uL (ref 3.77–5.28)
RDW: 15.1 % (ref 12.3–15.4)
WBC: 10.9 10*3/uL — AB (ref 3.4–10.8)

## 2016-10-24 LAB — POCT URINE PREGNANCY: PREG TEST UR: NEGATIVE

## 2016-10-24 MED ORDER — OXYCODONE-ACETAMINOPHEN 5-325 MG PO TABS: 1.0000 | ORAL_TABLET | ORAL | 0 refills | Status: DC | PRN

## 2016-10-24 MED FILL — OXYCODONE-ACETAMINOPHEN 5-3: 5-325 | 2 days supply | Qty: 15 | Fill #0

## 2016-10-24 NOTE — Addendum Note (Signed)
Addended by: Susanne Greenhouse E on: 10/24/2016 03:15 PM   Modules accepted: Orders

## 2016-10-24 NOTE — Telephone Encounter (Signed)
Patient complaining of pain, surgery had to be postponed. Appointment scheduled per nurse.   Routing to provider for final review. Patient agreeable to disposition. Will close encounter.

## 2016-10-24 NOTE — Progress Notes (Signed)
GYNECOLOGY  VISIT   HPI: 36 y.o.   Married  Caucasian  female   [phone] with No LMP recorded. Patient is not currently having periods (Reason: IUD).   here c/o severe pelvic pain  The patient has a h/o chronic LLQ abdominal pelvic pain with a stable multicystic benign appearing adnexal mass. She was scheduled for laparoscopy, surgery was postponed because she was diagnosed with diabetes. She was just started on metformin last week.  FS this morning was 116, 85 prior to lunch. Yesterday fasting was 109, last night 100 prior to bed.  Today she woke up with much worse LLQ abdominal/pelvic pain. She took 800 mg of ibuprofen 2 x this morning, tried tylenol. Pain is constant, stabbing, 6/10 in severity.  No fevers, she does have nausea (started yesterday), no diarrhea or constipation. No urinary c/o. Mirena IUD placed 04/19/16. No bleeding. Occasional spotting since insertion.   GYNECOLOGIC HISTORY: No LMP recorded. Patient is not currently having periods (Reason: IUD). Contraception:IUD  Menopausal hormone therapy: none         OB History    Gravida Para Term Preterm AB Living   '3 1 1   2 1   '$ SAB TAB Ectopic Multiple Live Births   2       1         Patient Active Problem List   Diagnosis Date Noted  . Healthcare maintenance 10/18/2016  . Type 2 diabetes mellitus without complications ([place]) 73/53/2992  . LOW BACK PAIN, ACUTE 11/20/2006  . POLYCYSTIC OVARY 07/04/2006  . OBESITY, NOS 07/04/2006  . DEPRESSIVE DISORDER, NOS 07/04/2006  . TOBACCO USE, QUIT 07/04/2006    Past Medical History:  Diagnosis Date  . Abnormal uterine bleeding   . Amenorrhea   . Anemia   . Anxiety    panic attacks  . Dysmenorrhea   . GERD (gastroesophageal reflux disease)   . Hyperlipidemia   . Infertility, female   . Insulin resistance   . Migraine without aura   . Obesity   . PCOS (polycystic ovarian syndrome)   . PCOS (polycystic ovarian syndrome)   . PONV (postoperative nausea and vomiting)   .  Urinary incontinence     Past Surgical History:  Procedure Laterality Date  . CHOLECYSTECTOMY    . DILATATION & CURRETTAGE/HYSTEROSCOPY WITH RESECTOCOPE N/A 10/17/2012   Procedure: DILATATION & CURETTAGE/HYSTEROSCOPY WITH RESECTOCOPE; Possible Polypectomy, Possible Resectoscopic Myomectomy.;  Surgeon: Floyce Stakes. Pamala Hurry, MD;  Location: [facility];  Service: Gynecology;  Laterality: N/A;  1 hr.  Marland Kitchen DILATION AND CURETTAGE OF UTERUS    . ENDOMETRIAL BIOPSY    . HYSTEROSCOPY W/D&C N/A 12/01/2013   Procedure: IUD Removal;  Surgeon: Floyce Stakes. Pamala Hurry, MD;  Location: [facility];  Service: Gynecology;  Laterality: N/A;  90 min.  . INTRAUTERINE DEVICE (IUD) INSERTION N/A 10/17/2012   Procedure: INTRAUTERINE DEVICE (IUD) INSERTION; Mirena;  Surgeon: Floyce Stakes. Pamala Hurry, MD;  Location: [facility];  Service: Gynecology;  Laterality: N/A;  . MOUTH SURGERY     wisdom teeth    Current Outpatient Prescriptions  Medication Sig Dispense Refill  . blood glucose meter kit and supplies KIT Dispense based on patient and insurance preference. Use up to four times daily as directed. (FOR ICD-9 250.00, 250.01). 1 each 0  . calcium carbonate (TUMS - DOSED IN MG ELEMENTAL CALCIUM) 500 MG chewable tablet Chew 3 tablets by mouth daily as needed for indigestion or heartburn.    . Cholecalciferol (D3-50) 50000 units capsule TAKE ONE CAPSULE  BY MOUTH ONCE A WEEK FOR LOW VITAMIN D 12 capsule 3  . citalopram (CELEXA) 20 MG tablet Take 1 tablet po a day 90 tablet 3  . diazepam (VALIUM) 5 MG tablet Take 1 tablet (5 mg total) by mouth every 8 (eight) hours as needed for anxiety. 60 tablet 0  . ibuprofen (ADVIL,MOTRIN) 200 MG tablet Take 400 mg by mouth every 6 (six) hours as needed for headache.    . levonorgestrel (MIRENA) 20 MCG/24HR IUD 1 each by Intrauterine route once.    . metFORMIN (GLUCOPHAGE) 500 MG tablet Take 1 tablet (500 mg total) by mouth 2 (two) times daily with a meal. Start by just taking 1 tablet with dinner for the first two  weeks, then twice daily. 180 tablet 3  . Omega Fatty Acids-Vitamins (OMEGA-3 GUMMIES) CHEW Chew by mouth.    . Probiotic Product (DIGESTIVE ADVANTAGE GUMMIES) CHEW Chew by mouth.    . promethazine (PHENERGAN) 12.5 MG tablet Take 1 tablet (12.5 mg total) every 4-6 hours as needed for nausea. 20 tablet 0  . VENTOLIN HFA 108 (90 Base) MCG/ACT inhaler Inhale 1-2 puffs into the lungs every 4 (four) hours as needed for wheezing.   0  . SUMAtriptan (IMITREX) 100 MG tablet Take 1 tablet (100 mg total) by mouth once. May repeat in 2 hours if headache persists or recurs.  Take a maximum of 2 tablets per 24 hour period. 15 tablet 0   No current facility-administered medications for this visit.      ALLERGIES: Iohexol  Family History  Problem Relation Age of Onset  . Diabetes Father   . Thyroid disease Father   . Hypertension Father   . Diabetes Maternal Grandmother   . Heart disease Maternal Grandmother   . Diabetes Maternal Grandfather   . Heart disease Maternal Grandfather   . Stroke Maternal Grandfather   . Diabetes Paternal Grandmother   . Diabetes Paternal Grandfather   . Heart disease Paternal Grandfather   . Alcohol abuse Paternal Grandfather   . Diabetes Other   . Obesity Other   . Sleep apnea Other   . Hyperlipidemia Other   . Hypertension Other   . Cancer Other   . Heart disease Other   . Hypertension Mother     Social History   Social History  . Marital status: Married    Spouse name: N/A  . Number of children: N/A  . Years of education: N/A   Occupational History  . Not on file.   Social History Main Topics  . Smoking status: Former Smoker    Types: Cigarettes  . Smokeless tobacco: Never Used  . Alcohol use Yes     Comment: occasional  . Drug use: No  . Sexual activity: Yes    Partners: Male    Birth control/ protection: None, IUD   Other Topics Concern  . Not on file   Social History Narrative  . No narrative on file    Review of Systems   Constitutional: Negative.   HENT: Negative.   Eyes: Negative.   Respiratory: Negative.   Cardiovascular: Negative.   Gastrointestinal: Negative.   Genitourinary:       Severe pelvic pain   Musculoskeletal: Negative.   Skin: Negative.   Neurological: Negative.   Endo/Heme/Allergies: Negative.   Psychiatric/Behavioral: Negative.     PHYSICAL EXAMINATION:    BP 130/80 (BP Location: Right Arm, Patient Position: Standing, Cuff Size: Large)   Pulse 72   Resp 18  Wt (!) 310 lb (140.6 kg)   BMI 47.14 kg/m     General appearance: alert, cooperative and appears stated age Abdomen: soft, tender in the LLQ abdominal pain, no rebound or guarding,  no masses,  no organomegaly  Pelvic: External genitalia:  no lesions              Urethra:  normal appearing urethra with no masses, tenderness or lesions              Bartholins and Skenes: normal                 Vagina: normal appearing vagina with normal color and discharge, no lesions              Cervix: no lesions and IUD string 3 cm              Bimanual Exam:  Uterus:  normal size, contour, position, consistency, mobility, non-tender              Adnexa: no masses, extremely tender in the left adnexa.                Chaperone was present for exam.  ASSESSMENT Worsening LLQ abdominal pain starting this morning, h/o left adnexal mass. She doesn't have an acute abdomen    PLAN UPT CBC with diff now Needs ultrasound Percocet for prn use   An After Visit Summary was printed and given to the patient.

## 2016-10-24 NOTE — Progress Notes (Addendum)
  Review of Systems  Constitutional: Negative.   HENT: Negative.   Eyes: Negative.   Respiratory: Negative.   Cardiovascular: Negative.   Gastrointestinal: Negative.   Genitourinary:       Severe pelvic pain   Musculoskeletal: Negative.   Skin: Negative.   Neurological: Negative.   Endo/Heme/Allergies: Negative.   Psychiatric/Behavioral: Negative.

## 2016-10-25 ENCOUNTER — Encounter: Payer: Self-pay | Admitting: Obstetrics and Gynecology

## 2016-10-25 ENCOUNTER — Ambulatory Visit (INDEPENDENT_AMBULATORY_CARE_PROVIDER_SITE_OTHER): Payer: 59

## 2016-10-25 ENCOUNTER — Ambulatory Visit (INDEPENDENT_AMBULATORY_CARE_PROVIDER_SITE_OTHER): Payer: 59 | Admitting: Obstetrics and Gynecology

## 2016-10-25 VITALS — BP 118/84 | HR 88 | Resp 18 | Wt 307.0 lb

## 2016-10-25 DIAGNOSIS — R109 Unspecified abdominal pain: Secondary | ICD-10-CM

## 2016-10-25 DIAGNOSIS — R102 Pelvic and perineal pain: Secondary | ICD-10-CM

## 2016-10-25 DIAGNOSIS — N9489 Other specified conditions associated with female genital organs and menstrual cycle: Secondary | ICD-10-CM

## 2016-10-25 DIAGNOSIS — N949 Unspecified condition associated with female genital organs and menstrual cycle: Secondary | ICD-10-CM | POA: Diagnosis not present

## 2016-10-25 NOTE — Progress Notes (Signed)
GYNECOLOGY  VISIT   HPI: 36 y.o.   Married  Caucasian  female   [phone] with No LMP recorded. Patient is not currently having periods (Reason: IUD).   here for follow up pelvic pain. The patient has had chronic LLQ abdominal/pelvic pain, but has had acute worsening in her pain since yesterday. She has a known left cystic pelvic mass and is scheduled for a laparoscopy. Her first surgery date for later this month was recently canceled secondary to the diagnosis of diabetes. She was just started on metformin. WBC from yesterday was 10.9. She has normal bowel and bladder function, no fevers. Her pain is unchanged since yesterday. She has been taking ibuprofen and tylenol. She was given a script for percocet yesterday and that helped her sleep.   GYNECOLOGIC HISTORY: No LMP recorded. Patient is not currently having periods (Reason: IUD). Contraception:IUD (Mirena) Menopausal hormone therapy: none        OB History    Gravida Para Term Preterm AB Living   '3 1 1   2 1   '$ SAB TAB Ectopic Multiple Live Births   2       1         Patient Active Problem List   Diagnosis Date Noted  . Healthcare maintenance 10/18/2016  . Type 2 diabetes mellitus without complications ([place]) 49/44/9675  . LOW BACK PAIN, ACUTE 11/20/2006  . POLYCYSTIC OVARY 07/04/2006  . OBESITY, NOS 07/04/2006  . DEPRESSIVE DISORDER, NOS 07/04/2006  . TOBACCO USE, QUIT 07/04/2006    Past Medical History:  Diagnosis Date  . Abnormal uterine bleeding   . Amenorrhea   . Anemia   . Anxiety    panic attacks  . Dysmenorrhea   . GERD (gastroesophageal reflux disease)   . Hyperlipidemia   . Infertility, female   . Insulin resistance   . Migraine without aura   . Obesity   . PCOS (polycystic ovarian syndrome)   . PCOS (polycystic ovarian syndrome)   . PONV (postoperative nausea and vomiting)   . Urinary incontinence     Past Surgical History:  Procedure Laterality Date  . CHOLECYSTECTOMY    . DILATATION &  CURRETTAGE/HYSTEROSCOPY WITH RESECTOCOPE N/A 10/17/2012   Procedure: DILATATION & CURETTAGE/HYSTEROSCOPY WITH RESECTOCOPE; Possible Polypectomy, Possible Resectoscopic Myomectomy.;  Surgeon: Floyce Stakes. Pamala Hurry, MD;  Location: [facility];  Service: Gynecology;  Laterality: N/A;  1 hr.  Marland Kitchen DILATION AND CURETTAGE OF UTERUS    . ENDOMETRIAL BIOPSY    . HYSTEROSCOPY W/D&C N/A 12/01/2013   Procedure: IUD Removal;  Surgeon: Floyce Stakes. Pamala Hurry, MD;  Location: [facility];  Service: Gynecology;  Laterality: N/A;  90 min.  . INTRAUTERINE DEVICE (IUD) INSERTION N/A 10/17/2012   Procedure: INTRAUTERINE DEVICE (IUD) INSERTION; Mirena;  Surgeon: Floyce Stakes. Pamala Hurry, MD;  Location: [facility];  Service: Gynecology;  Laterality: N/A;  . MOUTH SURGERY     wisdom teeth    Current Outpatient Prescriptions  Medication Sig Dispense Refill  . blood glucose meter kit and supplies KIT Dispense based on patient and insurance preference. Use up to four times daily as directed. (FOR ICD-9 250.00, 250.01). 1 each 0  . calcium carbonate (TUMS - DOSED IN MG ELEMENTAL CALCIUM) 500 MG chewable tablet Chew 3 tablets by mouth daily as needed for indigestion or heartburn.    . Cholecalciferol (D3-50) 50000 units capsule TAKE ONE CAPSULE BY MOUTH ONCE A WEEK FOR LOW VITAMIN D 12 capsule 3  . citalopram (CELEXA) 20 MG tablet Take 1 tablet  po a day 90 tablet 3  . diazepam (VALIUM) 5 MG tablet Take 1 tablet (5 mg total) by mouth every 8 (eight) hours as needed for anxiety. 60 tablet 0  . ibuprofen (ADVIL,MOTRIN) 200 MG tablet Take 400 mg by mouth every 6 (six) hours as needed for headache.    . levonorgestrel (MIRENA) 20 MCG/24HR IUD 1 each by Intrauterine route once.    . metFORMIN (GLUCOPHAGE) 500 MG tablet Take 1 tablet (500 mg total) by mouth 2 (two) times daily with a meal. Start by just taking 1 tablet with dinner for the first two weeks, then twice daily. 180 tablet 3  . Omega Fatty Acids-Vitamins (OMEGA-3 GUMMIES) CHEW Chew by mouth.    .  oxyCODONE-acetaminophen (PERCOCET) 5-325 MG tablet Take 1-2 tablets by mouth every 4 (four) hours as needed. use only as much as needed to relieve pain 15 tablet 0  . Probiotic Product (DIGESTIVE ADVANTAGE GUMMIES) CHEW Chew by mouth.    . promethazine (PHENERGAN) 12.5 MG tablet Take 1 tablet (12.5 mg total) every 4-6 hours as needed for nausea. 20 tablet 0  . VENTOLIN HFA 108 (90 Base) MCG/ACT inhaler Inhale 1-2 puffs into the lungs every 4 (four) hours as needed for wheezing.   0  . SUMAtriptan (IMITREX) 100 MG tablet Take 1 tablet (100 mg total) by mouth once. May repeat in 2 hours if headache persists or recurs.  Take a maximum of 2 tablets per 24 hour period. 15 tablet 0   No current facility-administered medications for this visit.      ALLERGIES: Iohexol  Family History  Problem Relation Age of Onset  . Diabetes Father   . Thyroid disease Father   . Hypertension Father   . Diabetes Maternal Grandmother   . Heart disease Maternal Grandmother   . Diabetes Maternal Grandfather   . Heart disease Maternal Grandfather   . Stroke Maternal Grandfather   . Diabetes Paternal Grandmother   . Diabetes Paternal Grandfather   . Heart disease Paternal Grandfather   . Alcohol abuse Paternal Grandfather   . Diabetes Other   . Obesity Other   . Sleep apnea Other   . Hyperlipidemia Other   . Hypertension Other   . Cancer Other   . Heart disease Other   . Hypertension Mother     Social History   Social History  . Marital status: Married    Spouse name: N/A  . Number of children: N/A  . Years of education: N/A   Occupational History  . Not on file.   Social History Main Topics  . Smoking status: Former Smoker    Types: Cigarettes  . Smokeless tobacco: Never Used  . Alcohol use Yes     Comment: occasional  . Drug use: No  . Sexual activity: Yes    Partners: Male    Birth control/ protection: None, IUD   Other Topics Concern  . Not on file   Social History Narrative  . No  narrative on file    Review of Systems  Constitutional: Negative.   HENT: Negative.   Eyes: Negative.   Respiratory: Negative.   Cardiovascular: Negative.   Gastrointestinal: Negative.   Genitourinary:       Pelvic pain-left side  Musculoskeletal: Negative.   Skin: Negative.   Neurological: Negative.   Endo/Heme/Allergies: Negative.   Psychiatric/Behavioral: Negative.     PHYSICAL EXAMINATION:    BP 118/84 (BP Location: Right Arm, Patient Position: Standing, Cuff Size: Large)  Pulse 88   Resp 18   Wt (!) 307 lb (139.3 kg)   BMI 46.68 kg/m     General appearance: alert, cooperative and appears stated age Abdomen: soft, tender in the LLQ, no rebound, no guarding; no masses,  no organomegaly  U/S: see report above, IUD in place. The left cystic pelvic mass is minimally increased in size, no signs of torsion. There is a new 1.6 cm cyst on the left side.  ASSESSMENT Left adnexal cystic mass, minimal change since the last ultrasound, no signs of torsion. New 1.6 cm cyst on the left Worsening LLQ abdominal/pelvic pain. She does not have an acute abdomen    PLAN Will continue to follow over the next few days. If her pain worsens will proceed with laparoscopy If she stabilizes, will hold on laparoscopy until her diabetes is under control She knows to call with worsening pain or any other concerns   An After Visit Summary was printed and given to the patient.  10 minutes face to face time of which over 50% was spent in counseling.

## 2016-10-29 ENCOUNTER — Ambulatory Visit (HOSPITAL_COMMUNITY)
Admission: RE | Admit: 2016-10-29 | Discharge: 2016-10-29 | Disposition: A | Discharge status: Home / Self Care | Payer: 59 | Source: Ambulatory Visit | Attending: Obstetrics and Gynecology | Admitting: Obstetrics and Gynecology

## 2016-10-29 ENCOUNTER — Encounter (HOSPITAL_COMMUNITY): Payer: Self-pay | Admitting: *Deleted

## 2016-10-29 ENCOUNTER — Other Ambulatory Visit: Payer: Self-pay | Admitting: Obstetrics and Gynecology

## 2016-10-29 ENCOUNTER — Ambulatory Visit (HOSPITAL_COMMUNITY): Payer: 59 | Admitting: Certified Registered Nurse Anesthetist

## 2016-10-29 ENCOUNTER — Encounter (HOSPITAL_COMMUNITY)
Admission: RE | Disposition: A | Discharge status: Home / Self Care | Payer: Self-pay | Source: Ambulatory Visit | Attending: Obstetrics and Gynecology

## 2016-10-29 DIAGNOSIS — Z8249 Family history of ischemic heart disease and other diseases of the circulatory system: Secondary | ICD-10-CM | POA: Diagnosis not present

## 2016-10-29 DIAGNOSIS — N946 Dysmenorrhea, unspecified: Secondary | ICD-10-CM | POA: Diagnosis not present

## 2016-10-29 DIAGNOSIS — Z823 Family history of stroke: Secondary | ICD-10-CM | POA: Diagnosis not present

## 2016-10-29 DIAGNOSIS — F329 Major depressive disorder, single episode, unspecified: Secondary | ICD-10-CM | POA: Insufficient documentation

## 2016-10-29 DIAGNOSIS — F410 Panic disorder [episodic paroxysmal anxiety] without agoraphobia: Secondary | ICD-10-CM | POA: Insufficient documentation

## 2016-10-29 DIAGNOSIS — F419 Anxiety disorder, unspecified: Secondary | ICD-10-CM | POA: Insufficient documentation

## 2016-10-29 DIAGNOSIS — N930 Postcoital and contact bleeding: Secondary | ICD-10-CM | POA: Insufficient documentation

## 2016-10-29 DIAGNOSIS — N736 Female pelvic peritoneal adhesions (postinfective): Secondary | ICD-10-CM | POA: Diagnosis not present

## 2016-10-29 DIAGNOSIS — R1032 Left lower quadrant pain: Secondary | ICD-10-CM | POA: Diagnosis not present

## 2016-10-29 DIAGNOSIS — Z833 Family history of diabetes mellitus: Secondary | ICD-10-CM | POA: Insufficient documentation

## 2016-10-29 DIAGNOSIS — N83202 Unspecified ovarian cyst, left side: Secondary | ICD-10-CM | POA: Diagnosis not present

## 2016-10-29 DIAGNOSIS — N7011 Chronic salpingitis: Secondary | ICD-10-CM | POA: Diagnosis not present

## 2016-10-29 DIAGNOSIS — E119 Type 2 diabetes mellitus without complications: Secondary | ICD-10-CM | POA: Insufficient documentation

## 2016-10-29 DIAGNOSIS — R102 Pelvic and perineal pain: Secondary | ICD-10-CM | POA: Diagnosis not present

## 2016-10-29 DIAGNOSIS — Z888 Allergy status to other drugs, medicaments and biological substances status: Secondary | ICD-10-CM | POA: Diagnosis not present

## 2016-10-29 DIAGNOSIS — D271 Benign neoplasm of left ovary: Secondary | ICD-10-CM | POA: Diagnosis not present

## 2016-10-29 DIAGNOSIS — Z87891 Personal history of nicotine dependence: Secondary | ICD-10-CM | POA: Diagnosis not present

## 2016-10-29 DIAGNOSIS — K219 Gastro-esophageal reflux disease without esophagitis: Secondary | ICD-10-CM | POA: Insufficient documentation

## 2016-10-29 DIAGNOSIS — Z79899 Other long term (current) drug therapy: Secondary | ICD-10-CM | POA: Diagnosis not present

## 2016-10-29 DIAGNOSIS — E282 Polycystic ovarian syndrome: Secondary | ICD-10-CM | POA: Diagnosis not present

## 2016-10-29 DIAGNOSIS — N941 Unspecified dyspareunia: Secondary | ICD-10-CM | POA: Diagnosis not present

## 2016-10-29 DIAGNOSIS — Z7984 Long term (current) use of oral hypoglycemic drugs: Secondary | ICD-10-CM | POA: Insufficient documentation

## 2016-10-29 DIAGNOSIS — Z6841 Body Mass Index (BMI) 40.0 and over, adult: Secondary | ICD-10-CM | POA: Diagnosis not present

## 2016-10-29 DIAGNOSIS — Z975 Presence of (intrauterine) contraceptive device: Secondary | ICD-10-CM | POA: Insufficient documentation

## 2016-10-29 DIAGNOSIS — E785 Hyperlipidemia, unspecified: Secondary | ICD-10-CM | POA: Insufficient documentation

## 2016-10-29 HISTORY — DX: Missed abortion: O02.1

## 2016-10-29 HISTORY — PX: CYSTOSCOPY: SHX5120

## 2016-10-29 HISTORY — DX: Type 2 diabetes mellitus without complications: E11.9

## 2016-10-29 HISTORY — PX: LAPAROSCOPIC SALPINGO OOPHERECTOMY: SHX5927

## 2016-10-29 LAB — COMPREHENSIVE METABOLIC PANEL
ALBUMIN: 4.5 g/dL (ref 3.5–5.0)
ALT: 24 U/L (ref 14–54)
AST: 21 U/L (ref 15–41)
Alkaline Phosphatase: 147 U/L — ABNORMAL HIGH (ref 38–126)
Anion gap: 6 (ref 5–15)
BILIRUBIN TOTAL: 0.7 mg/dL (ref 0.3–1.2)
BUN: 17 mg/dL (ref 6–20)
CHLORIDE: 105 mmol/L (ref 101–111)
CO2: 26 mmol/L (ref 22–32)
CREATININE: 0.87 mg/dL (ref 0.44–1.00)
Calcium: 9.4 mg/dL (ref 8.9–10.3)
GFR calc Af Amer: >60 mL/min (ref >60)
GFR calc non Af Amer: >60 mL/min (ref >60)
GLUCOSE: 116 mg/dL — AB (ref 65–99)
POTASSIUM: 4.1 mmol/L (ref 3.5–5.1)
Sodium: 137 mmol/L (ref 135–145)
TOTAL PROTEIN: 8.6 g/dL — AB (ref 6.5–8.1)

## 2016-10-29 LAB — CBC
HEMATOCRIT: 41.9 % (ref 36.0–46.0)
HEMOGLOBIN: 14 g/dL (ref 12.0–15.0)
MCH: 27.9 pg (ref 26.0–34.0)
MCHC: 33.4 g/dL (ref 30.0–36.0)
MCV: 83.6 fL (ref 78.0–100.0)
Platelets: 342 10*3/uL (ref 150–400)
RBC: 5.01 MIL/uL (ref 3.87–5.11)
RDW: 14.4 % (ref 11.5–15.5)
WBC: 10 10*3/uL (ref 4.0–10.5)

## 2016-10-29 LAB — PREGNANCY, URINE: Preg Test, Ur: NEGATIVE

## 2016-10-29 LAB — GLUCOSE, CAPILLARY: Glucose-Capillary: 136 mg/dL — ABNORMAL HIGH (ref 65–99)

## 2016-10-29 SURGERY — SALPINGO-OOPHORECTOMY, LAPAROSCOPIC
Anesthesia: General | Site: Bladder

## 2016-10-29 MED ORDER — FENTANYL CITRATE (PF) 100 MCG/2ML IJ SOLN
INTRAMUSCULAR | Status: AC
  Filled 2016-10-29: qty 2

## 2016-10-29 MED ORDER — MIDAZOLAM HCL 2 MG/2ML IJ SOLN
INTRAMUSCULAR | Status: AC
  Filled 2016-10-29: qty 2

## 2016-10-29 MED ORDER — LACTATED RINGERS IV SOLN: INTRAVENOUS | Status: DC

## 2016-10-29 MED ORDER — ONDANSETRON HCL 4 MG/2ML IJ SOLN
INTRAMUSCULAR | Status: AC
  Filled 2016-10-29: qty 2

## 2016-10-29 MED ORDER — MIDAZOLAM HCL 2 MG/2ML IJ SOLN
INTRAMUSCULAR | Status: DC | PRN
  Administered 2016-10-29: 2 mg via INTRAVENOUS

## 2016-10-29 MED ORDER — DEXAMETHASONE SODIUM PHOSPHATE 4 MG/ML IJ SOLN
INTRAMUSCULAR | Status: AC
  Filled 2016-10-29: qty 1

## 2016-10-29 MED ORDER — FENTANYL CITRATE (PF) 250 MCG/5ML IJ SOLN
INTRAMUSCULAR | Status: AC
  Filled 2016-10-29: qty 5

## 2016-10-29 MED ORDER — ACETAMINOPHEN 10 MG/ML IV SOLN
INTRAVENOUS | Status: AC
  Filled 2016-10-29: qty 100

## 2016-10-29 MED ORDER — MIDAZOLAM HCL 2 MG/2ML IJ SOLN: 0.5000 mg | Freq: Once | INTRAMUSCULAR | Status: DC | PRN

## 2016-10-29 MED ORDER — ROCURONIUM BROMIDE 100 MG/10ML IV SOLN
INTRAVENOUS | Status: DC | PRN
  Administered 2016-10-29: 20 mg via INTRAVENOUS
  Administered 2016-10-29: 50 mg via INTRAVENOUS

## 2016-10-29 MED ORDER — SUGAMMADEX SODIUM 500 MG/5ML IV SOLN
INTRAVENOUS | Status: AC
  Filled 2016-10-29: qty 5

## 2016-10-29 MED ORDER — SODIUM CHLORIDE 0.9 % IJ SOLN
INTRAMUSCULAR | Status: DC | PRN
  Administered 2016-10-29: 10 mL

## 2016-10-29 MED ORDER — LACTATED RINGERS IV SOLN
INTRAVENOUS | Status: DC
  Administered 2016-10-29: 125 mL/h via INTRAVENOUS
  Administered 2016-10-29: 13:00:00 via INTRAVENOUS
  Administered 2016-10-29: 125 mL/h via INTRAVENOUS

## 2016-10-29 MED ORDER — SCOPOLAMINE 1 MG/3DAYS TD PT72
MEDICATED_PATCH | TRANSDERMAL | Status: AC
  Administered 2016-10-29: 1.5 mg via TRANSDERMAL
  Filled 2016-10-29: qty 1

## 2016-10-29 MED ORDER — ONDANSETRON HCL 4 MG/2ML IJ SOLN
INTRAMUSCULAR | Status: DC | PRN
  Administered 2016-10-29: 4 mg via INTRAVENOUS

## 2016-10-29 MED ORDER — OXYCODONE-ACETAMINOPHEN 5-325 MG PO TABS: 1.0000 | ORAL_TABLET | ORAL | 0 refills | Status: DC | PRN

## 2016-10-29 MED ORDER — SOD CITRATE-CITRIC ACID 500-334 MG/5ML PO SOLN
30.0000 mL | ORAL | Status: AC
  Administered 2016-10-29: 30 mL via ORAL

## 2016-10-29 MED ORDER — BUPIVACAINE HCL (PF) 0.25 % IJ SOLN
INTRAMUSCULAR | Status: DC | PRN
  Administered 2016-10-29: 3 mL
  Administered 2016-10-29: 7 mL

## 2016-10-29 MED ORDER — NEOSTIGMINE METHYLSULFATE 10 MG/10ML IV SOLN
INTRAVENOUS | Status: AC
  Filled 2016-10-29: qty 1

## 2016-10-29 MED ORDER — LIDOCAINE HCL (CARDIAC) 20 MG/ML IV SOLN
INTRAVENOUS | Status: DC | PRN
  Administered 2016-10-29: 100 mg via INTRAVENOUS

## 2016-10-29 MED ORDER — SODIUM CHLORIDE 0.9 % IJ SOLN
INTRAMUSCULAR | Status: AC
  Filled 2016-10-29: qty 20

## 2016-10-29 MED ORDER — LIDOCAINE HCL (CARDIAC) 20 MG/ML IV SOLN
INTRAVENOUS | Status: AC
  Filled 2016-10-29: qty 5

## 2016-10-29 MED ORDER — HYDROMORPHONE HCL 1 MG/ML IJ SOLN
INTRAMUSCULAR | Status: AC
  Filled 2016-10-29: qty 1

## 2016-10-29 MED ORDER — DEXAMETHASONE SODIUM PHOSPHATE 10 MG/ML IJ SOLN
INTRAMUSCULAR | Status: DC | PRN
  Administered 2016-10-29: 4 mg via INTRAVENOUS

## 2016-10-29 MED ORDER — KETOROLAC TROMETHAMINE 30 MG/ML IJ SOLN
INTRAMUSCULAR | Status: DC | PRN
  Administered 2016-10-29: 30 mg via INTRAVENOUS

## 2016-10-29 MED ORDER — PHENAZOPYRIDINE HCL 200 MG PO TABS: ORAL_TABLET | ORAL | 0 refills | Status: DC

## 2016-10-29 MED ORDER — HYDROMORPHONE HCL 1 MG/ML IJ SOLN
INTRAMUSCULAR | Status: AC
  Administered 2016-10-29: 0.5 mg via INTRAVENOUS
  Filled 2016-10-29: qty 1

## 2016-10-29 MED ORDER — NEOSTIGMINE METHYLSULFATE 10 MG/10ML IV SOLN
INTRAVENOUS | Status: DC | PRN
  Administered 2016-10-29: 3 mg via INTRAVENOUS

## 2016-10-29 MED ORDER — BUPIVACAINE HCL (PF) 0.25 % IJ SOLN
INTRAMUSCULAR | Status: AC
  Filled 2016-10-29: qty 30

## 2016-10-29 MED ORDER — PROMETHAZINE HCL 25 MG/ML IJ SOLN: 6.2500 mg | INTRAMUSCULAR | Status: DC | PRN

## 2016-10-29 MED ORDER — HYDROMORPHONE HCL 1 MG/ML IJ SOLN
0.2500 mg | INTRAMUSCULAR | Status: DC | PRN
  Administered 2016-10-29 (×3): 0.5 mg via INTRAVENOUS

## 2016-10-29 MED ORDER — GLYCOPYRROLATE 0.2 MG/ML IJ SOLN
INTRAMUSCULAR | Status: AC
  Filled 2016-10-29: qty 2

## 2016-10-29 MED ORDER — METHYLENE BLUE 0.5 % INJ SOLN
INTRAVENOUS | Status: AC
  Filled 2016-10-29: qty 10

## 2016-10-29 MED ORDER — ROCURONIUM BROMIDE 100 MG/10ML IV SOLN
INTRAVENOUS | Status: AC
  Filled 2016-10-29: qty 1

## 2016-10-29 MED ORDER — MEPERIDINE HCL 25 MG/ML IJ SOLN: 6.2500 mg | INTRAMUSCULAR | Status: DC | PRN

## 2016-10-29 MED ORDER — LACTATED RINGERS IR SOLN
Status: DC | PRN
  Administered 2016-10-29: 3000 mL

## 2016-10-29 MED ORDER — PROPOFOL 10 MG/ML IV BOLUS
INTRAVENOUS | Status: AC
  Filled 2016-10-29: qty 20

## 2016-10-29 MED ORDER — FENTANYL CITRATE (PF) 100 MCG/2ML IJ SOLN
INTRAMUSCULAR | Status: DC | PRN
  Administered 2016-10-29 (×2): 25 ug via INTRAVENOUS
  Administered 2016-10-29: 50 ug via INTRAVENOUS
  Administered 2016-10-29: 100 ug via INTRAVENOUS
  Administered 2016-10-29 (×2): 25 ug via INTRAVENOUS
  Administered 2016-10-29 (×3): 50 ug via INTRAVENOUS
  Administered 2016-10-29 (×2): 25 ug via INTRAVENOUS

## 2016-10-29 MED ORDER — ENOXAPARIN SODIUM 40 MG/0.4ML ~~LOC~~ SOLN
40.0000 mg | SUBCUTANEOUS | Status: AC
  Administered 2016-10-29: 40 mg via SUBCUTANEOUS
  Filled 2016-10-29: qty 0.4

## 2016-10-29 MED ORDER — SOD CITRATE-CITRIC ACID 500-334 MG/5ML PO SOLN
ORAL | Status: AC
  Administered 2016-10-29: 30 mL via ORAL
  Filled 2016-10-29: qty 15

## 2016-10-29 MED ORDER — SCOPOLAMINE 1 MG/3DAYS TD PT72
1.0000 | MEDICATED_PATCH | Freq: Once | TRANSDERMAL | Status: DC
  Administered 2016-10-29: 1.5 mg via TRANSDERMAL

## 2016-10-29 MED ORDER — PROPOFOL 10 MG/ML IV BOLUS
INTRAVENOUS | Status: DC | PRN
  Administered 2016-10-29: 250 mg via INTRAVENOUS
  Administered 2016-10-29: 40 mg via INTRAVENOUS

## 2016-10-29 MED ORDER — ACETAMINOPHEN 10 MG/ML IV SOLN
1000.0000 mg | Freq: Once | INTRAVENOUS | Status: AC
  Administered 2016-10-29: 1000 mg via INTRAVENOUS

## 2016-10-29 MED ORDER — KETOROLAC TROMETHAMINE 30 MG/ML IJ SOLN
INTRAMUSCULAR | Status: AC
  Filled 2016-10-29: qty 1

## 2016-10-29 MED ORDER — GLYCOPYRROLATE 0.2 MG/ML IJ SOLN
INTRAMUSCULAR | Status: DC | PRN
  Administered 2016-10-29: 0.4 mg via INTRAVENOUS

## 2016-10-29 MED ORDER — IBUPROFEN 800 MG PO TABS: 800.0000 mg | ORAL_TABLET | Freq: Three times a day (TID) | ORAL | 1 refills | Status: DC | PRN

## 2016-10-29 MED FILL — PHENAZOPYRIDINE 200 MG TAB: 200 | 2 days supply | Qty: 6 | Fill #0

## 2016-10-29 MED FILL — IBUPROFEN 800 MG TAB: 800 | 10 days supply | Qty: 30 | Fill #0

## 2016-10-29 MED FILL — OXYCODONE-ACETAMINOPHEN 5-3: 5-325 | 2 days supply | Qty: 20 | Fill #0

## 2016-10-29 SURGICAL SUPPLY — 42 items
APPLICATOR ARISTA FLEXITIP XL (MISCELLANEOUS) ×5 IMPLANT
CABLE HIGH FREQUENCY MONO STRZ (ELECTRODE) IMPLANT
CANISTER SUCT 3000ML PPV (MISCELLANEOUS) ×5 IMPLANT
CLOTH BEACON ORANGE TIMEOUT ST (SAFETY) ×5 IMPLANT
COVER LIGHT HANDLE  1/PK (MISCELLANEOUS) ×1
COVER LIGHT HANDLE 1/PK (MISCELLANEOUS) ×4 IMPLANT
DERMABOND ADVANCED (GAUZE/BANDAGES/DRESSINGS) ×1
DERMABOND ADVANCED .7 DNX12 (GAUZE/BANDAGES/DRESSINGS) ×4 IMPLANT
DRSG OPSITE POSTOP 3X4 (GAUZE/BANDAGES/DRESSINGS) ×5 IMPLANT
DURAPREP 26ML APPLICATOR (WOUND CARE) ×5 IMPLANT
GLOVE BIOGEL PI IND STRL 7.0 (GLOVE) ×12 IMPLANT
GLOVE BIOGEL PI INDICATOR 7.0 (GLOVE) ×3
GLOVE ECLIPSE 6.5 STRL STRAW (GLOVE) ×10 IMPLANT
GOWN STRL REUS W/TWL LRG LVL3 (GOWN DISPOSABLE) ×10 IMPLANT
HEMOSTAT ARISTA ABSORB 3G PWDR (MISCELLANEOUS) ×5 IMPLANT
LIGASURE VESSEL 5MM BLUNT TIP (ELECTROSURGICAL) ×5 IMPLANT
NEEDLE INSUFFLATION 120MM (ENDOMECHANICALS) ×5 IMPLANT
NEEDLE SPNL 22GX3.5 QUINCKE BK (NEEDLE) ×5 IMPLANT
NS IRRIG 1000ML POUR BTL (IV SOLUTION) ×5 IMPLANT
PACK LAPAROSCOPY BASIN (CUSTOM PROCEDURE TRAY) ×5 IMPLANT
PACK TRENDGUARD 600 HYBRD PROC (MISCELLANEOUS) ×4 IMPLANT
POUCH LAPAROSCOPIC INSTRUMENT (MISCELLANEOUS) ×5 IMPLANT
POUCH SPECIMEN RETRIEVAL 10MM (ENDOMECHANICALS) ×5 IMPLANT
PROTECTOR NERVE ULNAR (MISCELLANEOUS) ×10 IMPLANT
SCISSORS LAP 5X35 DISP (ENDOMECHANICALS) IMPLANT
SET CYSTO W/LG BORE CLAMP LF (SET/KITS/TRAYS/PACK) IMPLANT
SET IRRIG TUBING LAPAROSCOPIC (IRRIGATION / IRRIGATOR) ×5 IMPLANT
SHEARS HARMONIC ACE PLUS 36CM (ENDOMECHANICALS) ×5 IMPLANT
SLEEVE ADV FIXATION 5X100MM (TROCAR) IMPLANT
SLEEVE XCEL OPT CAN 5 100 (ENDOMECHANICALS) IMPLANT
SUT VICRYL 0 UR6 27IN ABS (SUTURE) ×10 IMPLANT
SUT VICRYL 4-0 PS2 18IN ABS (SUTURE) ×5 IMPLANT
SYSTEM CARTER THOMASON II (TROCAR) IMPLANT
TOWEL OR 17X24 6PK STRL BLUE (TOWEL DISPOSABLE) ×10 IMPLANT
TRAY FOLEY CATH SILVER 14FR (SET/KITS/TRAYS/PACK) ×5 IMPLANT
TRENDGUARD 600 HYBRID PROC PK (MISCELLANEOUS) ×5
TROCAR 5M 150ML BLDLS (TROCAR) ×15 IMPLANT
TROCAR ADV FIXATION 11X100MM (TROCAR) IMPLANT
TROCAR ADV FIXATION 5X100MM (TROCAR) IMPLANT
TROCAR XCEL NON-BLD 11X100MML (ENDOMECHANICALS) ×5 IMPLANT
TROCAR XCEL NON-BLD 5MMX100MML (ENDOMECHANICALS) ×5 IMPLANT
WARMER LAPAROSCOPE (MISCELLANEOUS) ×5 IMPLANT

## 2016-10-29 NOTE — H&P (Signed)
GYNECOLOGY  VISIT   HPI: 36 y.o.   Married  Caucasian  female   [phone] with No LMP recorded. Patient is not currently having periods (Reason: IUD).   here for surgery consult. Patient c/o loss of sexual interest and pain and spotting with intercourse. The patient has a history of chronic LLQ abdominal pelvic pain with a stable multicystic benign appearing adnexal mass. She has a h/o oligomenorrhea and abnormal uterine bleeding and had a mirena IUD placed in 12/17 (in place on u/s in 3/18). She started having pain after intercourse prior to the IUD insertion in 12/17, getting worse. Spotting after intercourse.  No cycles with the IUD.  She is planning on bariatric surgery at the end of the year (sleeve gastrectomy). Working out 4 days a week, changed her diet.  She is down 4 lbs since 3/18.  She does snore, never evaluated for sleep apnea. Can lie flat.  In 11/17 she had a HgbA1C of 6.5%. She hasn't established care with a primary MD yet (the MD she was trying to see was out on leave). States recent HgbA1C was 7.2% with Bariatric surgeon.   GYNECOLOGIC HISTORY: No LMP recorded. Patient is not currently having periods (Reason: IUD). Contraception:IUD Menopausal hormone therapy: none                 OB History    Gravida Para Term Preterm AB Living   3 1 1   2 1    SAB TAB Ectopic Multiple Live Births   2       1             Patient Active Problem List   Diagnosis Date Noted  . LOW BACK PAIN, ACUTE 11/20/2006  . POLYCYSTIC OVARY 07/04/2006  . OBESITY, NOS 07/04/2006  . DEPRESSIVE DISORDER, NOS 07/04/2006  . TOBACCO USE, QUIT 07/04/2006        Past Medical History:  Diagnosis Date  . Abnormal uterine bleeding   . Amenorrhea   . Anemia   . Anxiety    panic attacks  . Dysmenorrhea   . GERD (gastroesophageal reflux disease)   . Hyperlipidemia   . Infertility, female   . Insulin resistance   . Migraine without aura   . Obesity   . PCOS (polycystic  ovarian syndrome)   . PCOS (polycystic ovarian syndrome)   . PONV (postoperative nausea and vomiting)   . Urinary incontinence          Past Surgical History:  Procedure Laterality Date  . CHOLECYSTECTOMY    . DILATATION & CURRETTAGE/HYSTEROSCOPY WITH RESECTOCOPE N/A 10/17/2012   Procedure: DILATATION & CURETTAGE/HYSTEROSCOPY WITH RESECTOCOPE; Possible Polypectomy, Possible Resectoscopic Myomectomy.;  Surgeon: Floyce Stakes. Pamala Hurry, MD;  Location: [facility];  Service: Gynecology;  Laterality: N/A;  1 hr.  Marland Kitchen DILATION AND CURETTAGE OF UTERUS    . ENDOMETRIAL BIOPSY    . HYSTEROSCOPY W/D&C N/A 12/01/2013   Procedure: IUD Removal;  Surgeon: Floyce Stakes. Pamala Hurry, MD;  Location: [facility];  Service: Gynecology;  Laterality: N/A;  90 min.  . INTRAUTERINE DEVICE (IUD) INSERTION N/A 10/17/2012   Procedure: INTRAUTERINE DEVICE (IUD) INSERTION; Mirena;  Surgeon: Floyce Stakes. Pamala Hurry, MD;  Location: [facility];  Service: Gynecology;  Laterality: N/A;          Current Outpatient Prescriptions  Medication Sig Dispense Refill  . Biotin 5000 MCG CAPS Take 1 capsule by mouth daily.    . calcium carbonate (TUMS - DOSED IN MG ELEMENTAL CALCIUM) 500 MG chewable  tablet Chew 3 tablets by mouth daily as needed for indigestion or heartburn.    . Cholecalciferol (D3-50) 50000 units capsule TAKE ONE CAPSULE BY MOUTH ONCE A WEEK FOR LOW VITAMIN D 12 capsule 3  . citalopram (CELEXA) 20 MG tablet Take 1 tablet po a day 90 tablet 3  . diazepam (VALIUM) 5 MG tablet Take 5 mg by mouth every 8 (eight) hours as needed for anxiety.    Marland Kitchen ibuprofen (ADVIL,MOTRIN) 200 MG tablet Take 400 mg by mouth every 6 (six) hours as needed for headache.    Ernestine Conrad Fatty Acids-Vitamins (OMEGA-3 GUMMIES) CHEW Chew by mouth.    . Probiotic Product (DIGESTIVE ADVANTAGE GUMMIES) CHEW Chew by mouth.    . promethazine (PHENERGAN) 12.5 MG tablet Take 1 tablet (12.5 mg total) every 4-6 hours as needed for nausea. 20 tablet 0  .  promethazine (PHENERGAN) 25 MG tablet Take 25 mg by mouth daily as needed for nausea or vomiting.     . SUMAtriptan (IMITREX) 100 MG tablet Take 1 tablet (100 mg total) by mouth once. May repeat in 2 hours if headache persists or recurs.  Take a maximum of 2 tablets per 24 hour period. 15 tablet 0  . VENTOLIN HFA 108 (90 Base) MCG/ACT inhaler Inhale 1-2 puffs into the lungs every 4 (four) hours as needed for wheezing.   0   No current facility-administered medications for this visit.      ALLERGIES: Iohexol       Family History  Problem Relation Age of Onset  . Diabetes Father   . Thyroid disease Father   . Diabetes Maternal Grandmother   . Heart disease Maternal Grandmother   . Diabetes Maternal Grandfather   . Heart disease Maternal Grandfather   . Stroke Maternal Grandfather   . Diabetes Paternal Grandmother   . Diabetes Paternal Grandfather   . Heart disease Paternal Grandfather   . Diabetes Other   . Obesity Other   . Sleep apnea Other   . Hyperlipidemia Other   . Hypertension Other   . Cancer Other   . Heart disease Other     Social History        Social History  . Marital status: Married    Spouse name: N/A  . Number of children: N/A  . Years of education: N/A      Occupational History  . Not on file.         Social History Main Topics  . Smoking status: Former Smoker    Types: Cigarettes  . Smokeless tobacco: Never Used  . Alcohol use Yes     Comment: occasional  . Drug use: No  . Sexual activity: Yes    Partners: Male    Birth control/ protection: None       Other Topics Concern  . Not on file   Social History Narrative  . No narrative on file    Review of Systems  Constitutional: Negative.   HENT: Negative.   Eyes: Negative.   Respiratory: Negative.   Cardiovascular: Negative.   Gastrointestinal: Negative.   Genitourinary:       Loss of sexual interest  Pain and spotting with intercourse   Musculoskeletal: Negative.   Skin: Negative.   Neurological: Positive for headaches.  Endo/Heme/Allergies: Negative.   Psychiatric/Behavioral: Negative.     PHYSICAL EXAMINATION:    BP 130/80 (BP Location: Right Arm, Patient Position: Sitting, Cuff Size: Normal)   Pulse 84   Resp 16  Wt (!) 308 lb (139.7 kg)   BMI 46.83 kg/m     General appearance: alert, cooperative and appears stated age Neck: no adenopathy, supple, symmetrical, trachea midline and thyroid normal to inspection and palpation Heart: regular rate and rhythm Lungs: CTAB Abdomen: soft, non-tender; bowel sounds normal; no masses,  no organomegaly Extremities: normal, atraumatic, no cyanosis Skin: normal color, texture and turgor, no rashes or lesions Lymph: normal cervical supraclavicular and inguinal nodes Neurologic: grossly normal  She is comfortable flat on her back    ASSESSMENT Chronic left lower quadrant pelvic and abdominal pain Cystic left adnexal mass, stable Dyspareunia Elevated glucose, last HgbA1C was 6.5% in 11/17, reports HgbA1C in early May of 7.2% (c/w diabetes mellitus) Obesity    PLAN Diagnostic laparoscopy, possible left ovarian cystectomy, possible salpingectomy, possible LSO Risks of laparoscopy were reviewed, including but not limited to: infection, bleeding, need for laparotomy, damage to nearby organs (bowel, bladder, vessels and ureters) She is aware her  Pain could persist after surgery Will set her up to see primary MD I explained to her that if her HgbA1C is over 7, we will need to postpone her surgery Will set her up for a sleep study, risk of sleep apnea We discussed a possible bowel prep, she goes from constipation to diarrhea, if constipated then would do the prep Working of weight loss, seeing a nutritionist   Addendum: the patient's HgbA1C was 7.2%. Surgery was postponed, she saw a primary and was started on metformin. Since last week her pain has gotten  progressively worse. No major changes on ultrasound, no signs of torsion. Her pain persists to the point of needed percocet for pain control. Will proceed with surgery today.   CC: [name], NP

## 2016-10-29 NOTE — Anesthesia Procedure Notes (Signed)
Procedure Name: Intubation Date/Time: 10/29/2016 12:41 PM Performed by: Hewitt Blade Pre-anesthesia Checklist: Patient identified, Emergency Drugs available, Suction available and Patient being monitored Patient Re-evaluated:Patient Re-evaluated prior to inductionOxygen Delivery Method: Circle system utilized Preoxygenation: Pre-oxygenation with 100% oxygen Intubation Type: IV induction Ventilation: Mask ventilation without difficulty Laryngoscope Size: Mac and 3 Grade View: Grade I Tube type: Oral Tube size: 7.0 mm Number of attempts: 1 Airway Equipment and Method: Stylet Placement Confirmation: ETT inserted through vocal cords under direct vision,  positive ETCO2 and breath sounds checked- equal and bilateral Secured at: 20 cm Tube secured with: Tape Dental Injury: Teeth and Oropharynx as per pre-operative assessment

## 2016-10-29 NOTE — Op Note (Signed)
Preoperative Diagnosis: acute worsening of chronic LLQ abdominal/pelvic pain  Postoperative Diagnosis: Same   Procedure:  Diagnostic laparoscopy, lysis of adhesions, left salpingo oophorectomy, biopsy anterior uterine wall, chromopertubation, cystoscopy  Surgeon: Dr Sumner Boast  Assistant: Dr Edwinna Areola  Anesthesia: General  EBL: 20  Fluids: 1,700 cc  Urine output: 50 cc  Complications: none  Indications for surgery: The patient is a 36 year old female, who presented with chronic LLQ abdominal/pelvic pain, she has had acute worsening of her pain in the last 5 days. Work up included several ultrasounds and lab work. Ultrasound has showed a persistent benign appearing multicystic left adnexal mass. The patient original surgery was scheduled this month, it was postponed secondary to the diagnosis of diabetes. She had a HgbA1C of 7.2% and was started on metformin a few weeks ago. She is also scheduled for a sleep study for possible sleep apnea next month.  Her pain acutely worsened last week, ultrasound without signs of torsion, her cyst was slightly larger and she had a 1.6 cm cyst right next to the left ovary. The patient's pain has only been controlled with percocet. Secondary to the worsening of her pain the decision was made to proceed with surgery.  The patient is aware of the risks and complications involved with the surgery and consent was obtained prior to the procedure.  Findings: Laparoscopy: enlarged cystic left ovary, the left tube had an approximately 1.5 cm cyst that appeared to be filled with blood (the patient has had multiple negative UPT's, including today). There was an area on the anterior uterus that appeared to be an area of endometriosis, 2 small cysts filled with blood. She had filmy adhesions of her left adnexa to the left pelvic side wall. The left ureter was identified and was aware from the surgical site.  Normal uterus and right adnexa. Chromopertubation showed  bilateral fill and spill of dye. Cystoscopy showed a normal bladder with bilateral ureteral jets seen. The urethra was slightly red, but normal.   Procedure: The patient was taken to the operating room with an IV in placed. She was placed in the dorsal lithotomy position. General anesthesia was administered. She was prepped and draped in the usual sterile fashion for an abdominal, vaginal surgery. A acorn uterine manipulator was placed. A foley catheter was placed.   The umbilicus was everted, injected with 0.25% marcaine and incised with a # 11 blade. 2 towel clips were used to elevated the umbilicus and a veress needle was placed into the abdominal cavity. The abdominal cavity was insufflated with CO2, with normal intraabdominal pressures. After adequate pneumo-insufflation the veress needle was removed and the 5 mm laparoscope was placed into the abdominal cavity using the opti-view trocar. The patient was placed in trendelenburg and the abdominal pelvic cavity was inspected. 2 more trocars were placed. 1 in the left  lower quadrant approximately 3 cm medial to and superior to the anterior superior iliac spine, and one in the left lower quadrant 3 cm medial and superior to the anterior superior iliac spine. These areas were injected with 0.25% marcaine, incised with a #11 blades, and 5 mm trocars were inserted with direct visualization with the laparoscope. A fourth trocar was placed in the mid lower abdomen. That are was injected with local, incised with a 11 blade and an 11 mm trocar was placed with direct visualization with the laparoscope. The abdominal pelvic cavity was again inspected.    Chromopertubation was then performed using 1 ml of  methylene blue mixed with normal saline. There was bilateral fill and spill of dye.   The left ureter was identified. The adhesions of the left adnexa to the left side wall were taken down with a combination of sharp and blunt dissection. The left infundibulopelvic  ligament was cauterized and cut with the ligasure device. The left mesosalpinx and mesovarium were also cauterized and cut with the ligasure device. The tube was separated from the uterus using the ligasure and the uterine ovarian ligament was cut and cauterized with the ligasure device. Hemostasis was excellent. The anterior uterine wall abnormality was then removed with the harmonic scalpel. The endobag was inserted through the midline trocar and the left adnexa was placed in the bag. The gab was brought up through the incision. The incision had to be enlarged slightly secondary to limited visualization with the patient's large BMI. The specimen was removed in pieces.   The midline fascia was then closed with a combination of routine open closure and use of the [employer] device. Due to the deep location of the fascia this was difficult and took at least 30 minutes to complete. The abdominal pelvic cavity was then re inspected. The pelvis was irrigated and suctioned dry. There had been some mild bleeding from lysis of adhesions and run down from the midline abdominal incision. This spontaneously stopped. The pelvis was irrigated and suctioned dry. No active bleeding was seen. Arista was placed over the surgical incisions.   The abdominal cavity was desufflated and the remaining trocars were removed. The skin was closed with subcuticular stiches of 4-0 vicryl and dermabond was placed over the incisions.  The acorn uterine manipulator was removed. At this time it was noted that the foley was no longer in the bladder. The foley had urine in it, but the balloon was still inflated. It had somehow been pulled out during the case.   Cystoscopy was then performed. The bladder mucosa was normal, ureteral jets were seen bilaterally and the urethra was slightly red, but normal. The bladder was drained and the cystoscope was removed.   The patient's abdomen and perineum were cleansed and she was taken out of the  dorsal lithotomy position. Upon awakening she was extubated and taken to the recovery room in stable condition. The sponge and instrument counts were correct.

## 2016-10-29 NOTE — Anesthesia Postprocedure Evaluation (Signed)
Anesthesia Post Note  Patient: [name] [name] [facility]  Procedure(s) Performed: Procedure(s) (LRB): LAPAROSCOPIC SALPINGO OOPHORECTOMY With anterior uterine biopsy (Left) CYSTOSCOPY (N/A)     Patient location during evaluation: PACU Anesthesia Type: General Level of consciousness: awake and alert, oriented and patient cooperative Pain management: pain level controlled Vital Signs Assessment: post-procedure vital signs reviewed and stable Respiratory status: spontaneous breathing, nonlabored ventilation and respiratory function stable Cardiovascular status: blood pressure returned to baseline and stable Postop Assessment: no signs of nausea or vomiting Anesthetic complications: no    Last Vitals:  Vitals:   10/29/16 1645 10/29/16 1700  BP: 128/74 131/66  Pulse: (!) 58 67  Resp: 18 13  Temp:  37 C    Last Pain:  Vitals:   10/29/16 1700  TempSrc:   PainSc: 1    Pain Goal: Patients Stated Pain Goal: 3 (10/29/16 1115)               Seleta Rhymes. Meegan Shanafelt

## 2016-10-29 NOTE — Transfer of Care (Signed)
Immediate Anesthesia Transfer of Care Note  Patient: Abbigayle Toole [facility]  Procedure(s) Performed: Procedure(s): LAPAROSCOPIC SALPINGO OOPHORECTOMY With anterior uterine biopsy (Left) CYSTOSCOPY (N/A)  Patient Location: PACU  Anesthesia Type:General  Level of Consciousness: awake, alert , oriented and patient cooperative  Airway & Oxygen Therapy: Patient Spontanous Breathing and Patient connected to nasal cannula oxygen  Post-op Assessment: Report given to RN and Post -op Vital signs reviewed and stable  Post vital signs: Reviewed and stable  Last Vitals:  Vitals:   10/29/16 1115  BP: (!) 137/93  Pulse: 66  Resp: 18  Temp: 36.8 C    Last Pain:  Vitals:   10/29/16 1115  TempSrc: Oral  PainSc: 5       Patients Stated Pain Goal: 3 (76/39/43 2003)  Complications: No apparent anesthesia complications

## 2016-10-29 NOTE — Interval H&P Note (Signed)
History and Physical Interval Note:  10/29/2016 12:29 PM  [name] [name]  has presented today for surgery, with the diagnosis of left ovarian cyst, increasing LLQ pain  The various methods of treatment have been discussed with the patient and family. After consideration of risks, benefits and other options for treatment, the patient has consented to  Procedure(s) with comments: LAPAROSCOPY DIAGNOSTIC (N/A) - Total 1.5 hours LAPAROSCOPIC OVARIAN CYSTECTOMY  Possible (Left) - possible Left salpingoopherectomy  as a surgical intervention .  The patient's history has been reviewed, patient examined, no change in status, stable for surgery.  I have reviewed the patient's chart and labs.  Questions were answered to the patient's satisfaction.   As per above addendum, she was started on metformin since that visit.   Salvadore Dom

## 2016-10-29 NOTE — Addendum Note (Signed)
Addendum  created 10/29/16 1857 by Raenette Rover, CRNA   Charge Capture section accepted

## 2016-10-29 NOTE — Discharge Instructions (Signed)
DISCHARGE INSTRUCTIONS: Laparoscopy ° °The following instructions have been prepared to help you care for yourself upon your return home today. ° °Wound care: °• Do not get the incision wet for the first 24 hours. The incision should be kept clean and dry. °• The Band-Aids or dressings may be removed the day after surgery. °• Should the incision become sore, red, and swollen after the first week, check with your doctor. ° °Personal hygiene: °• Shower the day after your procedure. ° °Activity and limitations: °• Do NOT drive or operate any equipment today. °• Do NOT lift anything more than 15 pounds for 2-3 weeks after surgery. °• Do NOT rest in bed all day. °• Walking is encouraged. Walk each day, starting slowly with 5-minute walks 3 or 4 times a day. Slowly increase the length of your walks. °• Walk up and down stairs slowly. °• Do NOT do strenuous activities, such as golfing, playing tennis, bowling, running, biking, weight lifting, gardening, mowing, or vacuuming for 2-4 weeks. Ask your doctor when it is okay to start. ° °Diet: Eat a light meal as desired this evening. You may resume your usual diet tomorrow. ° °Return to work: This is dependent on the type of work you do. For the most part you can return to a desk job within a week of surgery. If you are more active at work, please discuss this with your doctor. ° °What to expect after your surgery: You may have a slight burning sensation when you urinate on the first day. You may have a very small amount of blood in the urine. Expect to have a small amount of vaginal discharge/light bleeding for 1-2 weeks. It is not unusual to have abdominal soreness and bruising for up to 2 weeks. You may be tired and need more rest for about 1 week. You may experience shoulder pain for 24-72 hours. Lying flat in bed may relieve it. ° °Call your doctor for any of the following: °• Develop a fever of 100.4 or greater °• Inability to urinate 6 hours after discharge from  hospital °• Severe pain not relieved by pain medications °• Persistent of heavy bleeding at incision site °• Redness or swelling around incision site after a week °• Increasing nausea or vomiting ° °Patient Signature________________________________________ °Nurse Signature_________________________________________ °Post Anesthesia Home Care Instructions ° °Activity: °Get plenty of rest for the remainder of the day. A responsible individual must stay with you for 24 hours following the procedure.  °For the next 24 hours, DO NOT: °-Drive a car °-Operate machinery °-Drink alcoholic beverages °-Take any medication unless instructed by your physician °-Make any legal decisions or sign important papers. ° °Meals: °Start with liquid foods such as gelatin or soup. Progress to regular foods as tolerated. Avoid greasy, spicy, heavy foods. If nausea and/or vomiting occur, drink only clear liquids until the nausea and/or vomiting subsides. Call your physician if vomiting continues. ° °Special Instructions/Symptoms: °Your throat may feel dry or sore from the anesthesia or the breathing tube placed in your throat during surgery. If this causes discomfort, gargle with warm salt water. The discomfort should disappear within 24 hours. ° °If you had a scopolamine patch placed behind your ear for the management of post- operative nausea and/or vomiting: ° °1. The medication in the patch is effective for 72 hours, after which it should be removed.  Wrap patch in a tissue and discard in the trash. Wash hands thoroughly with soap and water. °2. You may remove the patch   earlier than 72 hours if you experience unpleasant side effects which may include dry mouth, dizziness or visual disturbances. 3. Avoid touching the patch. Wash your hands with soap and water after contact with the patch.   

## 2016-10-29 NOTE — Anesthesia Preprocedure Evaluation (Addendum)
Anesthesia Evaluation  Patient identified by MRN, date of birth, ID band Patient awake    Reviewed: Allergy & Precautions, NPO status , Patient's Chart, lab work & pertinent test results  History of Anesthesia Complications (+) PONV  Airway Mallampati: I  TM Distance: >3 FB Neck ROM: Full    Dental  (+) Dental Advisory Given   Pulmonary former smoker (quit 1/18),    breath sounds clear to auscultation       Cardiovascular (-) anginanegative cardio ROS   Rhythm:Regular Rate:Normal     Neuro/Psych  Headaches,    GI/Hepatic Neg liver ROS, GERD  Poorly Controlled,  Endo/Other  diabetes (glu 116), Oral Hypoglycemic AgentsMorbid obesityPolycystic ovarian  Renal/GU negative Renal ROS     Musculoskeletal   Abdominal (+) + obese,   Peds  Hematology negative hematology ROS (+)   Anesthesia Other Findings   Reproductive/Obstetrics                            Anesthesia Physical Anesthesia Plan  ASA: III  Anesthesia Plan: General   Post-op Pain Management:    Induction: Intravenous and Rapid sequence  PONV Risk Score and Plan: 4 or greater and Ondansetron, Dexamethasone, Propofol, Midazolam and Scopolamine patch - Pre-op  Airway Management Planned: Oral ETT  Additional Equipment:   Intra-op Plan:   Post-operative Plan: Extubation in OR  Informed Consent: I have reviewed the patients History and Physical, chart, labs and discussed the procedure including the risks, benefits and alternatives for the proposed anesthesia with the patient or authorized representative who has indicated his/her understanding and acceptance.   Dental advisory given  Plan Discussed with: CRNA and Surgeon  Anesthesia Plan Comments: (Plan routine monitors, GETA)        Anesthesia Quick Evaluation

## 2016-10-30 ENCOUNTER — Encounter (HOSPITAL_COMMUNITY): Payer: Self-pay | Admitting: Obstetrics and Gynecology

## 2016-10-31 ENCOUNTER — Ambulatory Visit (INDEPENDENT_AMBULATORY_CARE_PROVIDER_SITE_OTHER): Payer: 59 | Admitting: Obstetrics and Gynecology

## 2016-10-31 ENCOUNTER — Encounter: Payer: Self-pay | Admitting: Obstetrics and Gynecology

## 2016-10-31 ENCOUNTER — Telehealth: Payer: Self-pay | Admitting: *Deleted

## 2016-10-31 VITALS — BP 120/80 | HR 68 | Temp 99.5°F | Resp 20 | Ht 68.0 in | Wt 307.0 lb

## 2016-10-31 DIAGNOSIS — N760 Acute vaginitis: Secondary | ICD-10-CM | POA: Diagnosis not present

## 2016-10-31 DIAGNOSIS — Z9889 Other specified postprocedural states: Secondary | ICD-10-CM | POA: Diagnosis not present

## 2016-10-31 DIAGNOSIS — B9689 Other specified bacterial agents as the cause of diseases classified elsewhere: Secondary | ICD-10-CM

## 2016-10-31 DIAGNOSIS — R300 Dysuria: Secondary | ICD-10-CM

## 2016-10-31 DIAGNOSIS — N762 Acute vulvitis: Secondary | ICD-10-CM | POA: Diagnosis not present

## 2016-10-31 MED ORDER — METRONIDAZOLE 500 MG PO TABS: 500.0000 mg | ORAL_TABLET | Freq: Two times a day (BID) | ORAL | 0 refills | Status: DC

## 2016-10-31 MED ORDER — BETAMETHASONE VALERATE 0.1 % EX OINT: 1.0000 "application " | TOPICAL_OINTMENT | Freq: Two times a day (BID) | CUTANEOUS | 0 refills | Status: DC

## 2016-10-31 MED ORDER — PROMETHAZINE HCL 12.5 MG PO TABS: ORAL_TABLET | ORAL | 0 refills | Status: DC

## 2016-10-31 MED FILL — metroNIDAZOLE 500 MG TABS: 500 | 7 days supply | Qty: 14 | Fill #0

## 2016-10-31 MED FILL — BETAMETHASONE VALER 0.1% OI: 0.1 | 10 days supply | Qty: 30 | Fill #0

## 2016-10-31 NOTE — Progress Notes (Signed)
GYNECOLOGY  VISIT   HPI: 36 y.o.   Married  Caucasian  female   [phone] with No LMP recorded. Patient is not currently having periods (Reason: IUD).   She is 2 days s/p diagnostic laparoscopy, LOA, LSO, biopsy of uterine wall, chromopertubation and cystoscopy. The foley catheter was some how pulled out during the surgery with the balloon inflated.  She c/o intense vulvar burning and itching that stated severely at 2 am.  She thinks she is emptying her bladder, stops and starts.  No urinary leakage. On the percocet the pain is a 2-3 out of 10. Taking 1 percocet every 6 hours, taking ibuprofen every 8 hours. Pain is much better than prior to surgery. She has had some nausea this morning. No fevers. No BM yet.   GYNECOLOGIC HISTORY: No LMP recorded. Patient is not currently having periods (Reason: IUD). Contraception: IUD  Menopausal hormone therapy: None        OB History    Gravida Para Term Preterm AB Living   _0 SAB TAB Ectopic Multiple Live Births   2       1         Patient Active Problem List   Diagnosis Date Noted  . Healthcare maintenance 10/18/2016  . Type 2 diabetes mellitus without complications ([place]) 28/76/8115  . LOW BACK PAIN, ACUTE 11/20/2006  . POLYCYSTIC OVARY 07/04/2006  . OBESITY, NOS 07/04/2006  . DEPRESSIVE DISORDER, NOS 07/04/2006  . TOBACCO USE, QUIT 07/04/2006    Past Medical History:  Diagnosis Date  . Abnormal uterine bleeding   . Amenorrhea   . Anemia   . Anxiety    panic attacks  . Diabetes mellitus without complication ([place])    type 2  . Dysmenorrhea   . GERD (gastroesophageal reflux disease)    occasional - diet controlled and tums prn  . Hyperlipidemia    no meds -diet controlled  . Infertility, female   . Insulin resistance   . Migraine without aura   . Missed abortion    x 2 - both resolved without surgery  . Obesity   . PCOS (polycystic ovarian syndrome)   . PCOS (polycystic ovarian syndrome)   . PONV (postoperative  nausea and vomiting)   . SVD (spontaneous vaginal delivery)    x 1  . Urinary incontinence     Past Surgical History:  Procedure Laterality Date  . CHOLECYSTECTOMY    . COLONOSCOPY  2017   polyps  . CYSTOSCOPY N/A 10/29/2016   Procedure: CYSTOSCOPY;  Surgeon: Salvadore Dom, MD;  Location: [facility];  Service: Gynecology;  Laterality: N/A;  . DILATATION & CURRETTAGE/HYSTEROSCOPY WITH RESECTOCOPE N/A 10/17/2012   Procedure: DILATATION & CURETTAGE/HYSTEROSCOPY WITH RESECTOCOPE; Possible Polypectomy, Possible Resectoscopic Myomectomy.;  Surgeon: Floyce Stakes. Pamala Hurry, MD;  Location: [facility];  Service: Gynecology;  Laterality: N/A;  1 hr.  Marland Kitchen DILATION AND CURETTAGE OF UTERUS    . ENDOMETRIAL BIOPSY    . ESOPHAGOGASTRODUODENOSCOPY ENDOSCOPY  2017   Normal  . HYSTEROSCOPY W/D&C N/A 12/01/2013   Procedure: IUD Removal;  Surgeon: Claiborne Billings A. Pamala Hurry, MD;  Location: [facility];  Service: Gynecology;  Laterality: N/A;  90 min.  . INTRAUTERINE DEVICE (IUD) INSERTION N/A 10/17/2012   Procedure: INTRAUTERINE DEVICE (IUD) INSERTION; Mirena;  Surgeon: Floyce Stakes. Pamala Hurry, MD;  Location: [facility];  Service: Gynecology;  Laterality: N/A;  . LAPAROSCOPIC SALPINGO OOPHERECTOMY Left 10/29/2016   Procedure: LAPAROSCOPIC SALPINGO OOPHORECTOMY With anterior uterine biopsy;  Surgeon: Salvadore Dom, MD;  Location: [facility];  Service: Gynecology;  Laterality: Left;  . MOUTH SURGERY     wisdom teeth    Current Outpatient Prescriptions  Medication Sig Dispense Refill  . blood glucose meter kit and supplies KIT Dispense based on patient and insurance preference. Use up to four times daily as directed. (FOR ICD-9 250.00, 250.01). 1 each 0  . calcium carbonate (TUMS - DOSED IN MG ELEMENTAL CALCIUM) 500 MG chewable tablet Chew 3 tablets by mouth daily as needed for indigestion or heartburn.    . Cholecalciferol (D3-50) 50000 units capsule TAKE ONE CAPSULE BY MOUTH ONCE A WEEK FOR LOW VITAMIN D 12 capsule 3  . citalopram  (CELEXA) 20 MG tablet Take 1 tablet po a day 90 tablet 3  . diazepam (VALIUM) 5 MG tablet Take 1 tablet (5 mg total) by mouth every 8 (eight) hours as needed for anxiety. 60 tablet 0  . ibuprofen (ADVIL,MOTRIN) 800 MG tablet Take 1 tablet (800 mg total) by mouth every 8 (eight) hours as needed. 30 tablet 1  . levonorgestrel (MIRENA) 20 MCG/24HR IUD 1 each by Intrauterine route once.    . metFORMIN (GLUCOPHAGE) 500 MG tablet Take 1 tablet (500 mg total) by mouth 2 (two) times daily with a meal. Start by just taking 1 tablet with dinner for the first two weeks, then twice daily. 180 tablet 3  . Omega Fatty Acids-Vitamins (OMEGA-3 GUMMIES) CHEW Chew by mouth.    . oxyCODONE-acetaminophen (PERCOCET) 5-325 MG tablet Take 1-2 tablets by mouth every 4 (four) hours as needed. use only as much as needed to relieve pain 20 tablet 0  . phenazopyridine (PYRIDIUM) 200 MG tablet Take one tablet TID prn pain with voiding 6 tablet 0  . Probiotic Product (DIGESTIVE ADVANTAGE GUMMIES) CHEW Chew by mouth.    . promethazine (PHENERGAN) 12.5 MG tablet Take 1 tablet (12.5 mg total) every 4-6 hours as needed for nausea. 20 tablet 0  . SUMAtriptan (IMITREX) 100 MG tablet Take 1 tablet (100 mg total) by mouth once. May repeat in 2 hours if headache persists or recurs.  Take a maximum of 2 tablets per 24 hour period. 15 tablet 0   No current facility-administered medications for this visit.      ALLERGIES: Iohexol  Family History  Problem Relation Age of Onset  . Diabetes Father   . Thyroid disease Father   . Hypertension Father   . Diabetes Maternal Grandmother   . Heart disease Maternal Grandmother   . Diabetes Maternal Grandfather   . Heart disease Maternal Grandfather   . Stroke Maternal Grandfather   . Diabetes Paternal Grandmother   . Diabetes Paternal Grandfather   . Heart disease Paternal Grandfather   . Alcohol abuse Paternal Grandfather   . Diabetes Other   . Obesity Other   . Sleep apnea Other   .  Hyperlipidemia Other   . Hypertension Other   . Cancer Other   . Heart disease Other   . Hypertension Mother     Social History   Social History  . Marital status: Married    Spouse name: N/A  . Number of children: N/A  . Years of education: N/A   Occupational History  . Not on file.   Social History Main Topics  . Smoking status: Former Smoker    Packs/day: 0.25    Years: 19.00    Types: Cigarettes    Quit date: 06/06/2016  . Smokeless tobacco: Never Used  .  Alcohol use Yes     Comment: occasional  . Drug use: No  . Sexual activity: Yes    Partners: Male    Birth control/ protection: None, IUD   Other Topics Concern  . Not on file   Social History Narrative  . No narrative on file    Review of Systems  Constitutional: Negative.   HENT: Negative.   Eyes: Negative.   Respiratory: Negative.   Cardiovascular: Negative.   Gastrointestinal: Positive for nausea.  Genitourinary: Positive for dysuria.  Musculoskeletal: Negative.   Skin: Positive for itching.  Neurological: Negative.   Endo/Heme/Allergies: Negative.   Psychiatric/Behavioral: Negative.     PHYSICAL EXAMINATION:    BP 120/80 (BP Location: Right Arm, Patient Position: Sitting, Cuff Size: Large)   Pulse 68   Resp 20   Ht 5' 8" (1.727 m)   Wt (!) 307 lb (139.3 kg)   BMI 46.68 kg/m     General appearance: alert, cooperative and appears stated age Abdomen: soft, non-tender; minimal distention Incisions: healing well, no erythema, drainage or induration  Pelvic: External genitalia:  no lesions, marked erythema on her inner vulva bilaterally               Urethra:  normal appearing urethra with no masses, tenderness or lesions              Bartholins and Skenes: normal                 Vagina: normal appearing vagina with normal color and discharge, no lesions                Chaperone was present for exam.  Wet prep: ++ clue, no trich, few wbc KOH: no yeast PH: 5.5 (small amount of  blood)   ASSESSMENT Vulvar burning, pruritus and erythema BV on slides, suspect yeast but not seen Nausea (just this am) Pain is improving Some hesitancy with voiding, thinks she is emptying    PLAN Flagyl for BV Send wet prep probe for possible yeast Steroid ointment to vulva Send urine for culture Stop pyridium (could be irritating her skin) Phenergan for prn use Call if bladder function doesn't continue to improve   An After Visit Summary was printed and given to the patient.

## 2016-10-31 NOTE — Telephone Encounter (Signed)
Reviewed with Dr Talbert Nan. Call to patient. Advised pathology report confirms area of endometriosis. No malignancy. Will discuss further at post operative appointment on 11-12-16.    Routing to provider for final review. Patient agreeable to disposition. Will close encounter.

## 2016-10-31 NOTE — Telephone Encounter (Signed)
My Chart message from patient:  I have a question about SURGICAL PATHOLOGY EXAM resulted on 10/31/16 at 12:00 AM. So what does this mean? Is there a chance you will have to remove the rest of my reproductive organs?

## 2016-11-01 ENCOUNTER — Telehealth: Payer: Self-pay | Admitting: Obstetrics and Gynecology

## 2016-11-01 LAB — VAGINITIS/VAGINOSIS, DNA PROBE
Candida Species: NEGATIVE
GARDNERELLA VAGINALIS: NEGATIVE
TRICHOMONAS VAG: NEGATIVE

## 2016-11-01 NOTE — Telephone Encounter (Signed)
Left message to call Sharee Pimple at [phone]. Advised phones do go off at 4:30pm, will return call in 10 minutes.     Notes recorded by Salvadore Dom, MD on 11/01/2016 at 4:23 PM EDT Please let her know that her vaginitis panel was negative, I would still continue flagyl for BV, no yeast seen. She can continue to use the steroid ointment and Vaseline in between as needed for vulvar irritation. Please check to see if she is voiding better today

## 2016-11-01 NOTE — Telephone Encounter (Signed)
Patient's mom, Conan Bowens (No DPR on file to share PHI), called at lunch and left this message on our voice mail: She said, "Jareli wants to know if it is okay to use unscented baby wipes down there for freshness. Please just call Jailey back with the answer."   She did not leave a contact number for the patient. The phone number on file for Casandra is: [phone].

## 2016-11-01 NOTE — Telephone Encounter (Signed)
Spoke with patient, advised as seen below per Dr. Talbert Nan. Patient reports voiding is a little better, has improved some, reports some hesitancy at the end of voiding. Advised patient would update Dr. Talbert Nan and return call with any additional recommendations. Patient verbalizes understanding and is agreeable.  Routing to provider for final review. Patient is agreeable to disposition. Will close encounter.

## 2016-11-01 NOTE — Telephone Encounter (Signed)
We have received via fax, an "Attending Provider Statement" from [place].  I have spoken with Dara, with [employer] (ref# 11/01/16 @ 3:37PM) [phone]. Confirmed for Dara, surgery was performed on 10/29/16.  Tonette Bihari stated patient was out of work on 10/26/16 and asked if provider "supports' that out of work date. I checked with Nurse Supervisor, Lamont Snowball, RN and Dr Talbert Nan, Dr Talbert Nan advised, "yes" she supports that our of work date. Dara requested once the form has been completed to fax it,  to [phone] and reference claim number [id number].   cc: Dr Talbert Nan  cc: Lamont Snowball, RN

## 2016-11-02 ENCOUNTER — Telehealth: Payer: Self-pay | Admitting: Obstetrics and Gynecology

## 2016-11-02 ENCOUNTER — Emergency Department (HOSPITAL_COMMUNITY): Payer: 59

## 2016-11-02 ENCOUNTER — Encounter (HOSPITAL_COMMUNITY): Payer: Self-pay | Admitting: Emergency Medicine

## 2016-11-02 ENCOUNTER — Emergency Department (HOSPITAL_COMMUNITY)
Admission: EM | Admit: 2016-11-02 | Discharge: 2016-11-03 | Disposition: A | Discharge status: Home / Self Care | Payer: 59 | Attending: Emergency Medicine | Admitting: Emergency Medicine

## 2016-11-02 DIAGNOSIS — E119 Type 2 diabetes mellitus without complications: Secondary | ICD-10-CM | POA: Diagnosis not present

## 2016-11-02 DIAGNOSIS — Z79899 Other long term (current) drug therapy: Secondary | ICD-10-CM | POA: Diagnosis not present

## 2016-11-02 DIAGNOSIS — R1084 Generalized abdominal pain: Secondary | ICD-10-CM | POA: Insufficient documentation

## 2016-11-02 DIAGNOSIS — R197 Diarrhea, unspecified: Secondary | ICD-10-CM

## 2016-11-02 DIAGNOSIS — Z87891 Personal history of nicotine dependence: Secondary | ICD-10-CM | POA: Diagnosis not present

## 2016-11-02 DIAGNOSIS — R112 Nausea with vomiting, unspecified: Secondary | ICD-10-CM | POA: Insufficient documentation

## 2016-11-02 DIAGNOSIS — R1111 Vomiting without nausea: Secondary | ICD-10-CM | POA: Diagnosis not present

## 2016-11-02 LAB — COMPREHENSIVE METABOLIC PANEL
ALBUMIN: 3.9 g/dL (ref 3.5–5.0)
ALK PHOS: 62 U/L (ref 38–126)
ALT: 18 U/L (ref 14–54)
ANION GAP: 9 (ref 5–15)
AST: 21 U/L (ref 15–41)
BUN: 15 mg/dL (ref 6–20)
CALCIUM: 9.1 mg/dL (ref 8.9–10.3)
CO2: 23 mmol/L (ref 22–32)
CREATININE: 0.95 mg/dL (ref 0.44–1.00)
Chloride: 107 mmol/L (ref 101–111)
GFR calc non Af Amer: >60 mL/min (ref >60)
GLUCOSE: 193 mg/dL — AB (ref 65–99)
Potassium: 3.7 mmol/L (ref 3.5–5.1)
Sodium: 139 mmol/L (ref 135–145)
TOTAL PROTEIN: 7.3 g/dL (ref 6.5–8.1)
Total Bilirubin: 0.3 mg/dL (ref 0.3–1.2)

## 2016-11-02 LAB — URINALYSIS, ROUTINE W REFLEX MICROSCOPIC
Bacteria, UA: NONE SEEN
Bilirubin Urine: NEGATIVE
GLUCOSE, UA: 50 mg/dL — AB
Ketones, ur: NEGATIVE mg/dL
Leukocytes, UA: NEGATIVE
NITRITE: NEGATIVE
PH: 6 (ref 5.0–8.0)
PROTEIN: NEGATIVE mg/dL
SPECIFIC GRAVITY, URINE: 1.015 (ref 1.005–1.030)

## 2016-11-02 LAB — CBC
HCT: 37.6 % (ref 36.0–46.0)
HEMOGLOBIN: 12.3 g/dL (ref 12.0–15.0)
MCH: 27.2 pg (ref 26.0–34.0)
MCHC: 32.7 g/dL (ref 30.0–36.0)
MCV: 83 fL (ref 78.0–100.0)
PLATELETS: 346 10*3/uL (ref 150–400)
RBC: 4.53 MIL/uL (ref 3.87–5.11)
RDW: 14.1 % (ref 11.5–15.5)
WBC: 14.3 10*3/uL — ABNORMAL HIGH (ref 4.0–10.5)

## 2016-11-02 LAB — LIPASE, BLOOD: Lipase: 30 U/L (ref 11–51)

## 2016-11-02 LAB — PREGNANCY, URINE: Preg Test, Ur: NEGATIVE

## 2016-11-02 LAB — URINE CULTURE

## 2016-11-02 MED ORDER — ONDANSETRON HCL 4 MG/2ML IJ SOLN
4.0000 mg | Freq: Once | INTRAMUSCULAR | Status: AC
  Administered 2016-11-02: 4 mg via INTRAVENOUS
  Filled 2016-11-02: qty 2

## 2016-11-02 MED ORDER — SODIUM CHLORIDE 0.9 % IV SOLN
8.0000 mg | Freq: Once | INTRAVENOUS | Status: DC
  Filled 2016-11-02: qty 4

## 2016-11-02 MED ORDER — MORPHINE SULFATE (PF) 10 MG/ML IV SOLN
4.0000 mg | Freq: Once | INTRAVENOUS | Status: AC
  Administered 2016-11-02: 4 mg via INTRAVENOUS
  Filled 2016-11-02: qty 1

## 2016-11-02 MED ORDER — SODIUM CHLORIDE 0.9 % IV BOLUS (SEPSIS)
1000.0000 mL | Freq: Once | INTRAVENOUS | Status: AC
  Administered 2016-11-02: 1000 mL via INTRAVENOUS

## 2016-11-02 NOTE — Telephone Encounter (Signed)
Patient called asking to talk with triage. No details given.

## 2016-11-02 NOTE — Telephone Encounter (Signed)
Routing to provider for final review. Patient agreeable to disposition. Will close encounter.     

## 2016-11-02 NOTE — ED Triage Notes (Addendum)
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

## 2016-11-02 NOTE — ED Notes (Signed)
Pt had drawn in triage Gold  Lavender Lt green Dark green

## 2016-11-02 NOTE — ED Notes (Signed)
Pt did not want v/s rechecked.

## 2016-11-02 NOTE — ED Notes (Signed)
As directed by RN went out to get pt's vitals and pt refused bc she has had her vitals done and just wants a room

## 2016-11-02 NOTE — Telephone Encounter (Signed)
Return call to patient; Complains of sudden onset nausea/ vomiting X2 and abdominal pain. Started 10 mins ago, audibly crying and distressed. Reports had been passing flatus and has had two diarrhea stools today. Started colace yesterday. Pain in upper abdomen from "belly button to under breast bone" on both sides. 6/10 pain scale. Last percocet at 1230. One hour from office. Advised will check with MD and call back. 1625: After review with Dr Talbert Nan, call back to patient and instructed to go to [facility] ED. Patient states has taken a Percocet to see if would help. Advised nothing else to eat or drink. States due to nausea, has no desire to eat.  Instructed to go promptly to ED now. Patient agreeable.  CC: Dr Quincy Simmonds, MD on call

## 2016-11-03 DIAGNOSIS — Z87891 Personal history of nicotine dependence: Secondary | ICD-10-CM | POA: Diagnosis not present

## 2016-11-03 DIAGNOSIS — R1084 Generalized abdominal pain: Secondary | ICD-10-CM | POA: Diagnosis not present

## 2016-11-03 DIAGNOSIS — E119 Type 2 diabetes mellitus without complications: Secondary | ICD-10-CM | POA: Diagnosis not present

## 2016-11-03 DIAGNOSIS — R112 Nausea with vomiting, unspecified: Secondary | ICD-10-CM | POA: Diagnosis not present

## 2016-11-03 DIAGNOSIS — Z79899 Other long term (current) drug therapy: Secondary | ICD-10-CM | POA: Diagnosis not present

## 2016-11-03 MED ORDER — RANITIDINE HCL 150 MG PO CAPS: 150.0000 mg | ORAL_CAPSULE | Freq: Two times a day (BID) | ORAL | 0 refills | Status: DC

## 2016-11-03 MED ORDER — DICYCLOMINE HCL 10 MG/ML IM SOLN
20.0000 mg | Freq: Once | INTRAMUSCULAR | Status: AC
  Administered 2016-11-03: 20 mg via INTRAMUSCULAR
  Filled 2016-11-03: qty 2

## 2016-11-03 MED ORDER — KETOROLAC TROMETHAMINE 30 MG/ML IJ SOLN
30.0000 mg | Freq: Once | INTRAMUSCULAR | Status: AC
  Administered 2016-11-03: 30 mg via INTRAVENOUS
  Filled 2016-11-03: qty 1

## 2016-11-03 MED ORDER — DICYCLOMINE HCL 20 MG PO TABS: 20.0000 mg | ORAL_TABLET | Freq: Two times a day (BID) | ORAL | 0 refills | Status: DC

## 2016-11-03 MED ORDER — PROMETHAZINE HCL 25 MG PO TABS: 25.0000 mg | ORAL_TABLET | Freq: Four times a day (QID) | ORAL | 0 refills | Status: DC | PRN

## 2016-11-03 NOTE — ED Provider Notes (Signed)
[facility] DEPT Provider Note   CSN: [id number] Arrival date & time: 11/02/16  1734     History   Chief Complaint Chief Complaint  Patient presents with  . Abdominal Pain    HPI [name] [name] is a 36 y.o. female.  HPI Presents with abdominal pain, nausea, vomiting and diarrhea began today.  Had diagnostic laparoscopy, lysis of adhesions, left salpingo oophorectomy, biopsy of uterine wall, chromopertubation, cystoscopy on Monday.  Had been doing well, passing flatus, eating until today.  Has had significant pain, nausea, vomiting and diarrhea. Pain begins in LLQ and radiates  Upwards towards the periumbilical area, epigastrum and radiates bilaterally towards the back/flanks.  Severe pain.  Out of phnergan today.  No black or bloody stool. No fever.  Does report some urinary symptoms. Reported had foley issue during surgery and was placed on pyridium. Nothing makes pain better or worse.   Past Medical History:  Diagnosis Date  . Abnormal uterine bleeding   . Amenorrhea   . Anemia   . Anxiety    panic attacks  . Diabetes mellitus without complication ([place])    type 2  . Dysmenorrhea   . GERD (gastroesophageal reflux disease)    occasional - diet controlled and tums prn  . Hyperlipidemia    no meds -diet controlled  . Infertility, female   . Insulin resistance   . Migraine without aura   . Missed abortion    x 2 - both resolved without surgery  . Obesity   . PCOS (polycystic ovarian syndrome)   . PCOS (polycystic ovarian syndrome)   . PONV (postoperative nausea and vomiting)   . SVD (spontaneous vaginal delivery)    x 1  . Urinary incontinence     Patient Active Problem List   Diagnosis Date Noted  . Healthcare maintenance 10/18/2016  . Type 2 diabetes mellitus without complications ([place]) 66/81/5947  . LOW BACK PAIN, ACUTE 11/20/2006  . POLYCYSTIC OVARY 07/04/2006  . OBESITY, NOS 07/04/2006  . DEPRESSIVE DISORDER, NOS 07/04/2006  . TOBACCO USE, QUIT  07/04/2006    Past Surgical History:  Procedure Laterality Date  . CHOLECYSTECTOMY    . COLONOSCOPY  2017   polyps  . CYSTOSCOPY N/A 10/29/2016   Procedure: CYSTOSCOPY;  Surgeon: Salvadore Dom, MD;  Location: [facility];  Service: Gynecology;  Laterality: N/A;  . DILATATION & CURRETTAGE/HYSTEROSCOPY WITH RESECTOCOPE N/A 10/17/2012   Procedure: DILATATION & CURETTAGE/HYSTEROSCOPY WITH RESECTOCOPE; Possible Polypectomy, Possible Resectoscopic Myomectomy.;  Surgeon: Floyce Stakes. Pamala Hurry, MD;  Location: [facility];  Service: Gynecology;  Laterality: N/A;  1 hr.  Marland Kitchen DILATION AND CURETTAGE OF UTERUS    . ENDOMETRIAL BIOPSY    . ESOPHAGOGASTRODUODENOSCOPY ENDOSCOPY  2017   Normal  . HYSTEROSCOPY W/D&C N/A 12/01/2013   Procedure: IUD Removal;  Surgeon: Claiborne Billings A. Pamala Hurry, MD;  Location: [facility];  Service: Gynecology;  Laterality: N/A;  90 min.  . INTRAUTERINE DEVICE (IUD) INSERTION N/A 10/17/2012   Procedure: INTRAUTERINE DEVICE (IUD) INSERTION; Mirena;  Surgeon: Floyce Stakes. Pamala Hurry, MD;  Location: [facility];  Service: Gynecology;  Laterality: N/A;  . LAPAROSCOPIC SALPINGO OOPHERECTOMY Left 10/29/2016   Procedure: LAPAROSCOPIC SALPINGO OOPHORECTOMY With anterior uterine biopsy;  Surgeon: Salvadore Dom, MD;  Location: [facility];  Service: Gynecology;  Laterality: Left;  . MOUTH SURGERY     wisdom teeth    OB History    Gravida Para Term Preterm AB Living   _0 SAB TAB Ectopic  Multiple Live Births   2       1       Home Medications    Prior to Admission medications   Medication Sig Start Date End Date Taking? Authorizing Provider  betamethasone valerate ointment (VALISONE) 0.1 % Apply 1 application topically 2 (two) times daily. For 1-2 weeks as needed 10/31/16   Salvadore Dom, MD  blood glucose meter kit and supplies KIT Dispense based on patient and insurance preference. Use up to four times daily as directed. (FOR ICD-9 250.00, 250.01). 10/18/16   Danford, Valetta Fuller D, NP  calcium  carbonate (TUMS - DOSED IN MG ELEMENTAL CALCIUM) 500 MG chewable tablet Chew 3 tablets by mouth daily as needed for indigestion or heartburn.    [provider]  Cholecalciferol (D3-50) 50000 units capsule TAKE ONE CAPSULE BY MOUTH ONCE A WEEK FOR LOW VITAMIN D 03/27/16   Salvadore Dom, MD  citalopram (CELEXA) 20 MG tablet Take 1 tablet po a day 05/22/16   Salvadore Dom, MD  diazepam (VALIUM) 5 MG tablet Take 1 tablet (5 mg total) by mouth every 8 (eight) hours as needed for anxiety. 10/18/16   Danford, Valetta Fuller D, NP  dicyclomine (BENTYL) 20 MG tablet Take 1 tablet (20 mg total) by mouth 2 (two) times daily. 11/03/16   Gareth Morgan, MD  ibuprofen (ADVIL,MOTRIN) 800 MG tablet Take 1 tablet (800 mg total) by mouth every 8 (eight) hours as needed. 10/29/16   Salvadore Dom, MD  levonorgestrel (MIRENA) 20 MCG/24HR IUD 1 each by Intrauterine route once.    [provider]  metFORMIN (GLUCOPHAGE) 500 MG tablet Take 1 tablet (500 mg total) by mouth 2 (two) times daily with a meal. Start by just taking 1 tablet with dinner for the first two weeks, then twice daily. 10/18/16   Danford, Valetta Fuller D, NP  metroNIDAZOLE (FLAGYL) 500 MG tablet Take 1 tablet (500 mg total) by mouth 2 (two) times daily. 10/31/16   Salvadore Dom, MD  Omega Fatty Acids-Vitamins (OMEGA-3 GUMMIES) CHEW Chew by mouth.    [provider]  oxyCODONE-acetaminophen (PERCOCET) 5-325 MG tablet Take 1-2 tablets by mouth every 4 (four) hours as needed. use only as much as needed to relieve pain 10/29/16   Salvadore Dom, MD  phenazopyridine (PYRIDIUM) 200 MG tablet Take one tablet TID prn pain with voiding 10/29/16   Salvadore Dom, MD  Probiotic Product (DIGESTIVE ADVANTAGE [place]) CHEW Chew by mouth.    [provider]  promethazine (PHENERGAN) 25 MG tablet Take 1 tablet (25 mg total) by mouth every 6 (six) hours as needed for nausea or vomiting. 11/03/16   Gareth Morgan, MD    ranitidine (ZANTAC) 150 MG capsule Take 1 capsule (150 mg total) by mouth 2 (two) times daily. 11/03/16   Gareth Morgan, MD  SUMAtriptan (IMITREX) 100 MG tablet Take 1 tablet (100 mg total) by mouth once. May repeat in 2 hours if headache persists or recurs.  Take a maximum of 2 tablets per 24 hour period. 07/27/16 10/18/16  Salvadore Dom, MD    Family History Family History  Problem Relation Age of Onset  . Diabetes Father   . Thyroid disease Father   . Hypertension Father   . Diabetes Maternal Grandmother   . Heart disease Maternal Grandmother   . Diabetes Maternal Grandfather   . Heart disease Maternal Grandfather   . Stroke Maternal Grandfather   . Diabetes Paternal Grandmother   . Diabetes Paternal  Grandfather   . Heart disease Paternal Grandfather   . Alcohol abuse Paternal Grandfather   . Diabetes Other   . Obesity Other   . Sleep apnea Other   . Hyperlipidemia Other   . Hypertension Other   . Cancer Other   . Heart disease Other   . Hypertension Mother     Social History Social History  Substance Use Topics  . Smoking status: Former Smoker    Packs/day: 0.25    Years: 19.00    Types: Cigarettes    Quit date: 06/06/2016  . Smokeless tobacco: Never Used  . Alcohol use Yes     Comment: occasional     Allergies   Iohexol   Review of Systems Review of Systems  Constitutional: Negative for fever.  HENT: Negative for sore throat.   Eyes: Negative for visual disturbance.  Respiratory: Negative for cough and shortness of breath.   Cardiovascular: Negative for chest pain.  Gastrointestinal: Positive for abdominal pain, diarrhea, nausea and vomiting.  Genitourinary: Positive for dysuria. Negative for difficulty urinating.  Musculoskeletal: Positive for back pain. Negative for neck pain.  Skin: Negative for rash.  Neurological: Negative for syncope and headaches.     Physical Exam Updated Vital Signs BP (!) 101/53   Pulse 84   Temp 99.3 F (37.4  C) (Oral)   Resp 18   SpO2 94%   Physical Exam  Constitutional: She is oriented to person, place, and time. She appears well-developed and well-nourished. No distress.  In pain  HENT:  Head: Normocephalic and atraumatic.  Eyes: Conjunctivae and EOM are normal.  Neck: Normal range of motion.  Cardiovascular: Normal rate, regular rhythm, normal heart sounds and intact distal pulses.  Exam reveals no gallop and no friction rub.   No murmur heard. Pulmonary/Chest: Effort normal and breath sounds normal. No respiratory distress. She has no wheezes. She has no rales.  Abdominal: Soft. She exhibits no distension. There is tenderness (LLQ, LUQ). There is no guarding.  Right CVA tenderness  Musculoskeletal: She exhibits no edema or tenderness.  Neurological: She is alert and oriented to person, place, and time.  Skin: Skin is warm and dry. No rash noted. She is not diaphoretic. No erythema.  Nursing note and vitals reviewed.    ED Treatments / Results  Labs (all labs ordered are listed, but only abnormal results are displayed) Labs Reviewed  COMPREHENSIVE METABOLIC PANEL - Abnormal; Notable for the following:       Result Value   Glucose, Bld 193 (*)    All other components within normal limits  CBC - Abnormal; Notable for the following:    WBC 14.3 (*)    All other components within normal limits  URINALYSIS, ROUTINE W REFLEX MICROSCOPIC - Abnormal; Notable for the following:    Glucose, UA 50 (*)    Hgb urine dipstick MODERATE (*)    Squamous Epithelial / LPF 0-5 (*)    All other components within normal limits  LIPASE, BLOOD  PREGNANCY, URINE    EKG  EKG Interpretation None       Radiology Ct Abdomen Pelvis Wo Contrast  Result Date: 11/02/2016 CLINICAL DATA:  Lower abdominal pain with nausea and vomiting, onset at 09:00. Recent ovarian surgery. EXAM: CT ABDOMEN AND PELVIS WITHOUT CONTRAST TECHNIQUE: Multidetector CT imaging of the abdomen and pelvis was performed  following the standard protocol without IV contrast. COMPARISON:  None. FINDINGS: Lower chest: Minimal linear atelectatic changes in the right lung base. No consolidation  or effusion. Hepatobiliary: No focal liver abnormality is seen. Status post cholecystectomy. No biliary dilatation. Pancreas: Unremarkable. No pancreatic ductal dilatation or surrounding inflammatory changes. Spleen: Normal in size without focal abnormality. Adrenals/Urinary Tract: Adrenal glands are unremarkable. Kidneys are normal, without renal calculi, focal lesion, or hydronephrosis. Bladder is unremarkable. Stomach/Bowel: Stomach is within normal limits. Appendix is normal. No evidence of bowel wall thickening, distention, or inflammatory changes. Vascular/Lymphatic: No significant vascular findings are present. No enlarged abdominal or pelvic lymph nodes. Reproductive: Unremarkable uterus. IUD present. No adnexal mass or abnormal fluid collection. Other: Lower abdominal surgical/laparoscopic tract with mild surrounding subcutaneous edema. Tiny bubble of intraperitoneal air likely reflects the recent surgery. No focal inflammatory changes within the abdomen or pelvis. Musculoskeletal: No significant skeletal lesion. IMPRESSION: 1. Tiny bubble of intraperitoneal air, probably due to the recent surgery. 2. Surgical/laparoscopic track in the lower abdominal wall with mild surrounding subcutaneous edema; this is the expected appearance given the described procedure 4 days ago. 3. No inflammation or abnormal collections within the abdomen or pelvis. Electronically Signed   By: Andreas Newport M.D.   On: 11/02/2016 23:59    Procedures Procedures (including critical care time)  Medications Ordered in ED Medications  sodium chloride 0.9 % bolus 1,000 mL (0 mLs Intravenous Stopped 11/03/16 0108)  Morphine Sulfate (PF) SOLN 4 mg (4 mg Intravenous Given 11/02/16 2229)  ondansetron (ZOFRAN) injection 4 mg (4 mg Intravenous Given 11/02/16 2244)    ondansetron (ZOFRAN) injection 4 mg (4 mg Intravenous Given 11/02/16 2329)  dicyclomine (BENTYL) injection 20 mg (20 mg Intramuscular Given 11/03/16 0042)  ketorolac (TORADOL) 30 MG/ML injection 30 mg (30 mg Intravenous Given 11/03/16 0042)     Initial Impression / Assessment and Plan / ED Course  I have reviewed the triage vital signs and the nursing notes.  Pertinent labs & imaging results that were available during my care of the patient were reviewed by me and considered in my medical decision making (see chart for details).     36yo female with history of recent diagnostic laparascopy, lysis of adhesions, left salpingo oophorectomy, biopsy of anterior uterine wall, chromopertubation and cystoscopy on Monday, hx of DM, anxiety, IBS who presents with concern for abdominal pain.  Discussed with Dr. Quincy Simmonds, OBGYN on call.  Patient with both upper and lower abdominal pain. CT abdomen pelvis ordered showing expected post operative changes, no other acute abnormalities. No sign of UTI, pancreatitis on labs.  Given n/v/diarrhea may be infectious gastroenteritis.  Given rx for bentyl, phenergan, ranitidine. Patient discharged in stable condition with understanding of reasons to return.   Final Clinical Impressions(s) / ED Diagnoses   Final diagnoses:  Generalized abdominal pain  Nausea vomiting and diarrhea, suspect infectious    New Prescriptions Discharge Medication List as of 11/03/2016 12:54 AM    START taking these medications   Details  dicyclomine (BENTYL) 20 MG tablet Take 1 tablet (20 mg total) by mouth 2 (two) times daily., Starting Sat 11/03/2016, Print    ranitidine (ZANTAC) 150 MG capsule Take 1 capsule (150 mg total) by mouth 2 (two) times daily., Starting Sat 11/03/2016, Print         Gareth Morgan, MD 11/03/16 1141

## 2016-11-05 ENCOUNTER — Ambulatory Visit (INDEPENDENT_AMBULATORY_CARE_PROVIDER_SITE_OTHER): Payer: 59 | Admitting: Obstetrics and Gynecology

## 2016-11-05 ENCOUNTER — Encounter: Payer: Self-pay | Admitting: Obstetrics and Gynecology

## 2016-11-05 VITALS — BP 112/82 | HR 68 | Resp 68 | Wt 307.0 lb

## 2016-11-05 DIAGNOSIS — D72829 Elevated white blood cell count, unspecified: Secondary | ICD-10-CM

## 2016-11-05 DIAGNOSIS — K582 Mixed irritable bowel syndrome: Secondary | ICD-10-CM

## 2016-11-05 DIAGNOSIS — Z9889 Other specified postprocedural states: Secondary | ICD-10-CM

## 2016-11-05 LAB — CBC WITH DIFFERENTIAL/PLATELET
BASOS ABS: 0 10*3/uL (ref 0.0–0.2)
Basos: 0 %
EOS (ABSOLUTE): 0.4 10*3/uL (ref 0.0–0.4)
Eos: 4 %
HEMOGLOBIN: 12.2 g/dL (ref 11.1–15.9)
Hematocrit: 37.1 % (ref 34.0–46.6)
IMMATURE GRANS (ABS): 0 10*3/uL (ref 0.0–0.1)
IMMATURE GRANULOCYTES: 0 %
LYMPHS: 23 %
Lymphocytes Absolute: 2.3 10*3/uL (ref 0.7–3.1)
MCH: 27.1 pg (ref 26.6–33.0)
MCHC: 32.9 g/dL (ref 31.5–35.7)
MCV: 82 fL (ref 79–97)
MONOCYTES: 7 %
Monocytes Absolute: 0.7 10*3/uL (ref 0.1–0.9)
NEUTROS PCT: 66 %
Neutrophils Absolute: 6.7 10*3/uL (ref 1.4–7.0)
PLATELETS: 357 10*3/uL (ref 150–379)
RBC: 4.5 x10E6/uL (ref 3.77–5.28)
RDW: 14.7 % (ref 12.3–15.4)
WBC: 10.1 10*3/uL (ref 3.4–10.8)

## 2016-11-05 NOTE — Progress Notes (Signed)
GYNECOLOGY  VISIT   HPI: 36 y.o.   Married  Caucasian  female   [phone] with No LMP recorded. Patient is not currently having periods (Reason: IUD).   here for ED follow up. Patient still having abdominal pain   The patient is one week s/p laparoscopy, LOA, LSO.  She was seen in the ER on Friday with upper abdominal pain, nausea and diarrhea. She had a WBC of 14, negative abdominal/pelvic CT scan. She was treated with bentyl, phenergan and rantidine. She is still on flagyl for BV. She started that on Wednesday. She hasn't had a BM since Friday night.  The pain in from her upper abdomen and can radiate to her lower abdomen. The pain comes in waves. The nausea is better, still using some phenergan. When she is due for the bentyl it starts hurting again. She is also still taking the percocet every 8 hours (sometimes skips a dose).  Still slight burning with urination, so much better. May be external dysuria.     GYNECOLOGIC HISTORY: No LMP recorded. Patient is not currently having periods (Reason: IUD). Contraception:IUD  Menopausal hormone therapy: None         OB History    Gravida Para Term Preterm AB Living   _0 SAB TAB Ectopic Multiple Live Births   2       1         Patient Active Problem List   Diagnosis Date Noted  . Healthcare maintenance 10/18/2016  . Type 2 diabetes mellitus without complications ([place]) 36/04/2448  . LOW BACK PAIN, ACUTE 11/20/2006  . POLYCYSTIC OVARY 07/04/2006  . OBESITY, NOS 07/04/2006  . DEPRESSIVE DISORDER, NOS 07/04/2006  . TOBACCO USE, QUIT 07/04/2006    Past Medical History:  Diagnosis Date  . Abnormal uterine bleeding   . Amenorrhea   . Anemia   . Anxiety    panic attacks  . Diabetes mellitus without complication ([place])    type 2  . Dysmenorrhea   . GERD (gastroesophageal reflux disease)    occasional - diet controlled and tums prn  . Hyperlipidemia    no meds -diet controlled  . Infertility, female   . Insulin  resistance   . Migraine without aura   . Missed abortion    x 2 - both resolved without surgery  . Obesity   . PCOS (polycystic ovarian syndrome)   . PCOS (polycystic ovarian syndrome)   . PONV (postoperative nausea and vomiting)   . SVD (spontaneous vaginal delivery)    x 1  . Urinary incontinence     Past Surgical History:  Procedure Laterality Date  . CHOLECYSTECTOMY    . COLONOSCOPY  2017   polyps  . CYSTOSCOPY N/A 10/29/2016   Procedure: CYSTOSCOPY;  Surgeon: Salvadore Dom, MD;  Location: [facility];  Service: Gynecology;  Laterality: N/A;  . DILATATION & CURRETTAGE/HYSTEROSCOPY WITH RESECTOCOPE N/A 10/17/2012   Procedure: DILATATION & CURETTAGE/HYSTEROSCOPY WITH RESECTOCOPE; Possible Polypectomy, Possible Resectoscopic Myomectomy.;  Surgeon: Floyce Stakes. Pamala Hurry, MD;  Location: [facility];  Service: Gynecology;  Laterality: N/A;  1 hr.  Marland Kitchen DILATION AND CURETTAGE OF UTERUS    . ENDOMETRIAL BIOPSY    . ESOPHAGOGASTRODUODENOSCOPY ENDOSCOPY  2017   Normal  . HYSTEROSCOPY W/D&C N/A 12/01/2013   Procedure: IUD Removal;  Surgeon: Claiborne Billings A. Pamala Hurry, MD;  Location: [facility];  Service: Gynecology;  Laterality: N/A;  90 min.  . INTRAUTERINE DEVICE (IUD) INSERTION N/A 10/17/2012  Procedure: INTRAUTERINE DEVICE (IUD) INSERTION; Mirena;  Surgeon: Floyce Stakes. Pamala Hurry, MD;  Location: [facility];  Service: Gynecology;  Laterality: N/A;  . LAPAROSCOPIC SALPINGO OOPHERECTOMY Left 10/29/2016   Procedure: LAPAROSCOPIC SALPINGO OOPHORECTOMY With anterior uterine biopsy;  Surgeon: Salvadore Dom, MD;  Location: [facility];  Service: Gynecology;  Laterality: Left;  . MOUTH SURGERY     wisdom teeth    Current Outpatient Prescriptions  Medication Sig Dispense Refill  . betamethasone valerate ointment (VALISONE) 0.1 % Apply 1 application topically 2 (two) times daily. For 1-2 weeks as needed 30 g 0  . blood glucose meter kit and supplies KIT Dispense based on patient and insurance preference. Use up to four times  daily as directed. (FOR ICD-9 250.00, 250.01). 1 each 0  . calcium carbonate (TUMS - DOSED IN MG ELEMENTAL CALCIUM) 500 MG chewable tablet Chew 3 tablets by mouth daily as needed for indigestion or heartburn.    . Cholecalciferol (D3-50) 50000 units capsule TAKE ONE CAPSULE BY MOUTH ONCE A WEEK FOR LOW VITAMIN D 12 capsule 3  . citalopram (CELEXA) 20 MG tablet Take 1 tablet po a day 90 tablet 3  . diazepam (VALIUM) 5 MG tablet Take 1 tablet (5 mg total) by mouth every 8 (eight) hours as needed for anxiety. 60 tablet 0  . dicyclomine (BENTYL) 20 MG tablet Take 1 tablet (20 mg total) by mouth 2 (two) times daily. 20 tablet 0  . ibuprofen (ADVIL,MOTRIN) 800 MG tablet Take 1 tablet (800 mg total) by mouth every 8 (eight) hours as needed. 30 tablet 1  . levonorgestrel (MIRENA) 20 MCG/24HR IUD 1 each by Intrauterine route once.    . metFORMIN (GLUCOPHAGE) 500 MG tablet Take 1 tablet (500 mg total) by mouth 2 (two) times daily with a meal. Start by just taking 1 tablet with dinner for the first two weeks, then twice daily. 180 tablet 3  . Omega Fatty Acids-Vitamins (OMEGA-3 GUMMIES) CHEW Chew by mouth.    . oxyCODONE-acetaminophen (PERCOCET) 5-325 MG tablet Take 1-2 tablets by mouth every 4 (four) hours as needed. use only as much as needed to relieve pain 20 tablet 0  . phenazopyridine (PYRIDIUM) 200 MG tablet Take one tablet TID prn pain with voiding 6 tablet 0  . Probiotic Product (DIGESTIVE ADVANTAGE GUMMIES) CHEW Chew by mouth.    . promethazine (PHENERGAN) 25 MG tablet Take 1 tablet (25 mg total) by mouth every 6 (six) hours as needed for nausea or vomiting. 30 tablet 0  . ranitidine (ZANTAC) 150 MG capsule Take 1 capsule (150 mg total) by mouth 2 (two) times daily. 30 capsule 0  . SUMAtriptan (IMITREX) 100 MG tablet Take 1 tablet (100 mg total) by mouth once. May repeat in 2 hours if headache persists or recurs.  Take a maximum of 2 tablets per 24 hour period. 15 tablet 0   No current  facility-administered medications for this visit.      ALLERGIES: Iohexol  Family History  Problem Relation Age of Onset  . Diabetes Father   . Thyroid disease Father   . Hypertension Father   . Diabetes Maternal Grandmother   . Heart disease Maternal Grandmother   . Diabetes Maternal Grandfather   . Heart disease Maternal Grandfather   . Stroke Maternal Grandfather   . Diabetes Paternal Grandmother   . Diabetes Paternal Grandfather   . Heart disease Paternal Grandfather   . Alcohol abuse Paternal Grandfather   . Diabetes Other   . Obesity Other   .  Sleep apnea Other   . Hyperlipidemia Other   . Hypertension Other   . Cancer Other   . Heart disease Other   . Hypertension Mother     Social History   Social History  . Marital status: Married    Spouse name: N/A  . Number of children: N/A  . Years of education: N/A   Occupational History  . Not on file.   Social History Main Topics  . Smoking status: Former Smoker    Packs/day: 0.25    Years: 19.00    Types: Cigarettes    Quit date: 06/06/2016  . Smokeless tobacco: Never Used  . Alcohol use Yes     Comment: occasional  . Drug use: No  . Sexual activity: Yes    Partners: Male    Birth control/ protection: None, IUD   Other Topics Concern  . Not on file   Social History Narrative  . No narrative on file    Review of Systems  Constitutional: Negative.   HENT: Negative.   Eyes: Negative.   Respiratory: Negative.   Cardiovascular: Negative.   Gastrointestinal: Positive for abdominal pain and nausea.  Musculoskeletal: Negative.   Skin: Negative.   Neurological: Negative.   Endo/Heme/Allergies: Negative.   Psychiatric/Behavioral: Negative.     PHYSICAL EXAMINATION:    BP 112/82 (BP Location: Right Arm, Patient Position: Sitting, Cuff Size: Normal)   Pulse 68   Resp (!) 68   Wt (!) 307 lb (139.3 kg)   BMI 46.68 kg/m     General appearance: alert, cooperative and appears stated age Abdomen: soft,  mildly tender BLQ, appropriately; bowel sounds normal; no masses,  no organomegaly Incisions: The midline lower abdominal incision is slightly indurated. None of the incisions have erythema or drainage.    ASSESSMENT Severe upper abdominal pain 3 days ago, with vomiting and diarrhea. Negative CT scan. She has a h/o IBS and her pain resolved with Bentyl. No BM since then, pain still present but better.     PLAN She will stop the flagyl (due to stop tomorrow) If she doesn't have to take the bentyl, recommended she try not to take it, contributing to constipation   An After Visit Summary was printed and given to the patient.

## 2016-11-06 ENCOUNTER — Telehealth: Payer: Self-pay | Admitting: Obstetrics and Gynecology

## 2016-11-06 NOTE — Telephone Encounter (Signed)
Patient called and states her [employer] disability claim has been closed.  Advised patient I spoke with Dara with [employer] on 11/01/16 (see previous phone encounter) and faxed a completed Attending Providers Statement on 11/01/16, receiving the fax confirmation that the fax transmittal was successful.  In view of this information, I called [facility], spoke with Crystal ( call ref 11/06/16 at 4:02 PM) Crystal confirmed my phone documentation with Dara on 11/01/16 and also with Matrina on 11/05/16. Both documented that the provider supported absence beginning 10/26/16.  Crystal also noted the case was approved, states"once case approved the case will be closed, but the patient will receive benefits".  I convey this information to the patient.  As soon as call was completed, I received a fax from [place] time stamped 11/06/16 at 3:44PM requesting office notes from 10/31/16 office visit, as well as 11/05/16 office visit wit an electronic signed release from the patient.  The office notes were faxed to [employer] at fax number [phone] confirmation received that 17 page transmittal was successful.   cc: Lamont Snowball

## 2016-11-08 NOTE — Telephone Encounter (Signed)
Routing to provider for final review. Patient agreeable to disposition. Will close encounter.     

## 2016-11-12 ENCOUNTER — Encounter: Payer: Self-pay | Admitting: Obstetrics and Gynecology

## 2016-11-12 ENCOUNTER — Ambulatory Visit: Payer: 59 | Admitting: Obstetrics and Gynecology

## 2016-11-12 ENCOUNTER — Ambulatory Visit (INDEPENDENT_AMBULATORY_CARE_PROVIDER_SITE_OTHER): Payer: 59 | Admitting: Obstetrics and Gynecology

## 2016-11-12 VITALS — BP 142/90 | HR 72 | Resp 16 | Wt 296.0 lb

## 2016-11-12 DIAGNOSIS — L0292 Furuncle, unspecified: Secondary | ICD-10-CM

## 2016-11-12 DIAGNOSIS — R197 Diarrhea, unspecified: Secondary | ICD-10-CM | POA: Diagnosis not present

## 2016-11-12 DIAGNOSIS — Z9889 Other specified postprocedural states: Secondary | ICD-10-CM

## 2016-11-12 NOTE — Progress Notes (Signed)
GYNECOLOGY  VISIT   HPI: 36 y.o.   Married  Caucasian  female   [phone] with No LMP recorded. Patient is not currently having periods (Reason: IUD).   here for folow up pelvic pain    The patient is 2 weeks s/p laparoscopic LOA, LSO, chromopertubation and cystoscopy. 1 week post op she started having GI related pain and bowel changes. Negative CT scan. She c/o pain around her mid lower abdominal incision. The pain is a 3/10, constant.  No drainage, no redness around her incisions.  The pain from before surgery is gone. Overall surgery pain is much better. She is having diarrhea for the last 3 days. At least 4 watery BM a day. No fevers. The bowel pain has improved. Still with occasional nausea. She is taking the metformin for diabetes, was having diarrhea prior to surgery, a little less than this. With the bentyl and percocet she wasn't having BM's at all.   GYNECOLOGIC HISTORY: No LMP recorded. Patient is not currently having periods (Reason: IUD). Contraception:IUD Menopausal hormone therapy: none        OB History    Gravida Para Term Preterm AB Living   '3 1 1   2 1   '$ SAB TAB Ectopic Multiple Live Births   2       1         Patient Active Problem List   Diagnosis Date Noted  . Healthcare maintenance 10/18/2016  . Type 2 diabetes mellitus without complications ([place]) 28/00/3491  . LOW BACK PAIN, ACUTE 11/20/2006  . POLYCYSTIC OVARY 07/04/2006  . OBESITY, NOS 07/04/2006  . DEPRESSIVE DISORDER, NOS 07/04/2006  . TOBACCO USE, QUIT 07/04/2006    Past Medical History:  Diagnosis Date  . Abnormal uterine bleeding   . Amenorrhea   . Anemia   . Anxiety    panic attacks  . Diabetes mellitus without complication ([place])    type 2  . Dysmenorrhea   . GERD (gastroesophageal reflux disease)    occasional - diet controlled and tums prn  . Hyperlipidemia    no meds -diet controlled  . Infertility, female   . Insulin resistance   . Migraine without aura   . Missed abortion    x 2  - both resolved without surgery  . Obesity   . PCOS (polycystic ovarian syndrome)   . PCOS (polycystic ovarian syndrome)   . PONV (postoperative nausea and vomiting)   . SVD (spontaneous vaginal delivery)    x 1  . Urinary incontinence     Past Surgical History:  Procedure Laterality Date  . CHOLECYSTECTOMY    . COLONOSCOPY  2017   polyps  . CYSTOSCOPY N/A 10/29/2016   Procedure: CYSTOSCOPY;  Surgeon: Salvadore Dom, MD;  Location: [facility];  Service: Gynecology;  Laterality: N/A;  . DILATATION & CURRETTAGE/HYSTEROSCOPY WITH RESECTOCOPE N/A 10/17/2012   Procedure: DILATATION & CURETTAGE/HYSTEROSCOPY WITH RESECTOCOPE; Possible Polypectomy, Possible Resectoscopic Myomectomy.;  Surgeon: Floyce Stakes. Pamala Hurry, MD;  Location: [facility];  Service: Gynecology;  Laterality: N/A;  1 hr.  Marland Kitchen DILATION AND CURETTAGE OF UTERUS    . ENDOMETRIAL BIOPSY    . ESOPHAGOGASTRODUODENOSCOPY ENDOSCOPY  2017   Normal  . HYSTEROSCOPY W/D&C N/A 12/01/2013   Procedure: IUD Removal;  Surgeon: Claiborne Billings A. Pamala Hurry, MD;  Location: [facility];  Service: Gynecology;  Laterality: N/A;  90 min.  . INTRAUTERINE DEVICE (IUD) INSERTION N/A 10/17/2012   Procedure: INTRAUTERINE DEVICE (IUD) INSERTION; Mirena;  Surgeon: Floyce Stakes. Pamala Hurry, MD;  Location: [facility];  Service: Gynecology;  Laterality: N/A;  . LAPAROSCOPIC SALPINGO OOPHERECTOMY Left 10/29/2016   Procedure: LAPAROSCOPIC SALPINGO OOPHORECTOMY With anterior uterine biopsy;  Surgeon: Salvadore Dom, MD;  Location: [facility];  Service: Gynecology;  Laterality: Left;  . MOUTH SURGERY     wisdom teeth    Current Outpatient Prescriptions  Medication Sig Dispense Refill  . betamethasone valerate ointment (VALISONE) 0.1 % Apply 1 application topically 2 (two) times daily. For 1-2 weeks as needed 30 g 0  . blood glucose meter kit and supplies KIT Dispense based on patient and insurance preference. Use up to four times daily as directed. (FOR ICD-9 250.00, 250.01). 1 each 0  .  calcium carbonate (TUMS - DOSED IN MG ELEMENTAL CALCIUM) 500 MG chewable tablet Chew 3 tablets by mouth daily as needed for indigestion or heartburn.    . Cholecalciferol (D3-50) 50000 units capsule TAKE ONE CAPSULE BY MOUTH ONCE A WEEK FOR LOW VITAMIN D 12 capsule 3  . citalopram (CELEXA) 20 MG tablet Take 1 tablet po a day 90 tablet 3  . diazepam (VALIUM) 5 MG tablet Take 1 tablet (5 mg total) by mouth every 8 (eight) hours as needed for anxiety. 60 tablet 0  . dicyclomine (BENTYL) 20 MG tablet Take 1 tablet (20 mg total) by mouth 2 (two) times daily. 20 tablet 0  . ibuprofen (ADVIL,MOTRIN) 800 MG tablet Take 1 tablet (800 mg total) by mouth every 8 (eight) hours as needed. 30 tablet 1  . levonorgestrel (MIRENA) 20 MCG/24HR IUD 1 each by Intrauterine route once.    . metFORMIN (GLUCOPHAGE) 500 MG tablet Take 1 tablet (500 mg total) by mouth 2 (two) times daily with a meal. Start by just taking 1 tablet with dinner for the first two weeks, then twice daily. 180 tablet 3  . Omega Fatty Acids-Vitamins (OMEGA-3 GUMMIES) CHEW Chew by mouth.    . oxyCODONE-acetaminophen (PERCOCET) 5-325 MG tablet Take 1-2 tablets by mouth every 4 (four) hours as needed. use only as much as needed to relieve pain 20 tablet 0  . phenazopyridine (PYRIDIUM) 200 MG tablet Take one tablet TID prn pain with voiding 6 tablet 0  . Probiotic Product (DIGESTIVE ADVANTAGE GUMMIES) CHEW Chew by mouth.    . promethazine (PHENERGAN) 25 MG tablet Take 1 tablet (25 mg total) by mouth every 6 (six) hours as needed for nausea or vomiting. 30 tablet 0  . ranitidine (ZANTAC) 150 MG capsule Take 1 capsule (150 mg total) by mouth 2 (two) times daily. 30 capsule 0  . SUMAtriptan (IMITREX) 100 MG tablet Take 1 tablet (100 mg total) by mouth once. May repeat in 2 hours if headache persists or recurs.  Take a maximum of 2 tablets per 24 hour period. 15 tablet 0   No current facility-administered medications for this visit.      ALLERGIES:  Iohexol  Family History  Problem Relation Age of Onset  . Diabetes Father   . Thyroid disease Father   . Hypertension Father   . Diabetes Maternal Grandmother   . Heart disease Maternal Grandmother   . Diabetes Maternal Grandfather   . Heart disease Maternal Grandfather   . Stroke Maternal Grandfather   . Diabetes Paternal Grandmother   . Diabetes Paternal Grandfather   . Heart disease Paternal Grandfather   . Alcohol abuse Paternal Grandfather   . Diabetes Other   . Obesity Other   . Sleep apnea Other   . Hyperlipidemia Other   .  Hypertension Other   . Cancer Other   . Heart disease Other   . Hypertension Mother     Social History   Social History  . Marital status: Married    Spouse name: N/A  . Number of children: N/A  . Years of education: N/A   Occupational History  . Not on file.   Social History Main Topics  . Smoking status: Former Smoker    Packs/day: 0.25    Years: 19.00    Types: Cigarettes    Quit date: 06/06/2016  . Smokeless tobacco: Never Used  . Alcohol use Yes     Comment: occasional  . Drug use: No  . Sexual activity: Yes    Partners: Male    Birth control/ protection: None, IUD   Other Topics Concern  . Not on file   Social History Narrative  . No narrative on file    Review of Systems  Constitutional: Positive for weight loss.  HENT: Negative.   Eyes: Negative.   Respiratory: Negative.   Cardiovascular: Negative.   Gastrointestinal: Positive for diarrhea.  Genitourinary:       Abdominal pain  Musculoskeletal: Negative.   Skin: Negative.   Neurological: Negative.   Endo/Heme/Allergies: Negative.   Psychiatric/Behavioral: Negative.     PHYSICAL EXAMINATION:    BP (!) 142/90 (BP Location: Right Arm, Patient Position: Sitting, Cuff Size: Large)   Pulse 72   Resp 16   Wt 296 lb (134.3 kg)   BMI 45.01 kg/m     General appearance: alert, cooperative and appears stated age Abdomen: soft, tender around her mid lower  abdominal incision; no masses,  no organomegaly Incisions: mid lower incision with mild induration, no erythema, no drainage. Other incisions look fine. The umbilicus is glued together with the dermabond.   Pelvic: External genitalia:  She has a 1.5 cm boil on her left mons pubis              Cervix: no cervical motion tenderness              Bimanual Exam:  Uterus:  normal size, contour, position, consistency, mobility, non-tender              Adnexa: no mass, fullness, tenderness               Chaperone was present for exam.  ASSESSMENT 2 weeks post op, having pain around her mid lower incision Induration around her lower incision Diarrhea, unclear if infectious or from her metformin Boil on her mons    PLAN Warm compresses, hot soaks to the indurated incision and boil Send C. Diff culture F/U on Friday, sooner if needed   An After Visit Summary was printed and given to the patient.

## 2016-11-14 LAB — CLOSTRIDIUM DIFFICILE EIA: C difficile Toxins A+B, EIA: NEGATIVE

## 2016-11-15 ENCOUNTER — Encounter: Payer: 59 | Attending: Surgery | Admitting: Skilled Nursing Facility1

## 2016-11-15 ENCOUNTER — Ambulatory Visit (INDEPENDENT_AMBULATORY_CARE_PROVIDER_SITE_OTHER): Payer: 59 | Admitting: Psychiatry

## 2016-11-15 ENCOUNTER — Encounter: Payer: Self-pay | Admitting: Skilled Nursing Facility1

## 2016-11-15 ENCOUNTER — Ambulatory Visit (HOSPITAL_COMMUNITY)
Admission: RE | Admit: 2016-11-15 | Discharge: 2016-11-15 | Disposition: A | Discharge status: Home / Self Care | Payer: 59 | Source: Ambulatory Visit | Attending: Surgery | Admitting: Surgery

## 2016-11-15 DIAGNOSIS — I498 Other specified cardiac arrhythmias: Secondary | ICD-10-CM | POA: Diagnosis not present

## 2016-11-15 DIAGNOSIS — I4510 Unspecified right bundle-branch block: Secondary | ICD-10-CM | POA: Diagnosis not present

## 2016-11-15 DIAGNOSIS — E119 Type 2 diabetes mellitus without complications: Secondary | ICD-10-CM | POA: Diagnosis not present

## 2016-11-15 DIAGNOSIS — F329 Major depressive disorder, single episode, unspecified: Secondary | ICD-10-CM | POA: Insufficient documentation

## 2016-11-15 DIAGNOSIS — K219 Gastro-esophageal reflux disease without esophagitis: Secondary | ICD-10-CM | POA: Diagnosis not present

## 2016-11-15 DIAGNOSIS — Z79899 Other long term (current) drug therapy: Secondary | ICD-10-CM | POA: Diagnosis not present

## 2016-11-15 DIAGNOSIS — Z01818 Encounter for other preprocedural examination: Secondary | ICD-10-CM | POA: Diagnosis not present

## 2016-11-15 DIAGNOSIS — E7800 Pure hypercholesterolemia, unspecified: Secondary | ICD-10-CM | POA: Insufficient documentation

## 2016-11-15 DIAGNOSIS — Z6841 Body Mass Index (BMI) 40.0 and over, adult: Secondary | ICD-10-CM | POA: Insufficient documentation

## 2016-11-15 DIAGNOSIS — M549 Dorsalgia, unspecified: Secondary | ICD-10-CM | POA: Insufficient documentation

## 2016-11-15 DIAGNOSIS — K224 Dyskinesia of esophagus: Secondary | ICD-10-CM | POA: Diagnosis not present

## 2016-11-15 DIAGNOSIS — K449 Diaphragmatic hernia without obstruction or gangrene: Secondary | ICD-10-CM | POA: Insufficient documentation

## 2016-11-15 DIAGNOSIS — F509 Eating disorder, unspecified: Secondary | ICD-10-CM | POA: Diagnosis not present

## 2016-11-15 DIAGNOSIS — Z713 Dietary counseling and surveillance: Secondary | ICD-10-CM | POA: Diagnosis not present

## 2016-11-15 DIAGNOSIS — F419 Anxiety disorder, unspecified: Secondary | ICD-10-CM | POA: Insufficient documentation

## 2016-11-15 NOTE — Progress Notes (Signed)
Pre-Op Assessment Visit:  Pre-Operative Sleeve Gastrectomy Surgery  Medical Nutrition Therapy:  Appt start time: 10:59  End time:  12:15  Patient was seen on 11/15/2016 for Pre-Operative Nutrition Assessment. Assessment and letter of approval faxed to [facility] Surgery Bariatric Surgery Program coordinator on 11/15/2016.  Pt states she was just diagnosed with diabetes in May. Pt state she is having diarrhea multiple times throughout the week and affecting her job. Pt states she gets nausea when she takes her metformin on an empty stomach. Pt states she has GERD reflux every night. Pt states her grandmother recently passed away. Pt states sometimes she feels excessively full in the morning so she skips breakfast. Pt states she checks her blood sugar fasting and 2 hours after lunch and then before bed: 130-136 fasting: 100-119 2 hours after lunch: 120-130 2 hours after dinner.   Pt expectation of surgery: to be able to run and be healthy  Pt expectation of Dietitian: to help me get there  Start weight at [facility]: 296.9 BMI: 45.14  24 hr Dietary Recall: First Meal6am: protein shake and jimmy dean delight sandwhich Snack 10:30-11: tuna creation packet with fruit or crackers Second Meal 12:30: frozen meal or salad  Snack3-4: p3 protein pack and fruit or protein bar Third Meal: order out---grilled chicken and vegetable and carbohydrate Snack:  Beverages: water, soda, sparkling water, sugar free orangade, gatorade   Encouraged to engage in 150 minutes of moderate physical activity including cardiovascular and weight baring weekly  Handouts given during visit include:  . Pre-Op Goals . Bariatric Surgery Protein Shakes During the appointment today the following Pre-Op Goals were reviewed with the patient: . Maintain or lose weight as instructed by your surgeon . Make healthy food choices . Begin to limit portion sizes . Limited concentrated sugars and fried foods . Keep fat/sugar in the  single digits per serving on             food labels . Practice CHEWING your food  (aim for 30 chews per bite or until applesauce consistency) . Practice not drinking 15 minutes before, during, and 30 minutes after each meal/snack . Avoid all carbonated beverages  . Avoid/limit caffeinated beverages  . Avoid all sugar-sweetened beverages . Consume 3 meals per day; eat every 3-5 hours . Make a list of non-food related activities . Aim for 64-100 ounces of FLUID daily  . Aim for at least 60-80 grams of PROTEIN daily . Look for a liquid protein source that contain ?15 g protein and ?5 g carbohydrate  (ex: shakes, drinks, shots) . Goals: -Call the doctor that prescribed the metformin and tell them about your bowel habits  -Take your metformin with meals -Tell Dr. Hassell Done about your new diagnosis and the recent surgery -Follow diet recommendations listed below   Energy and Macronutrient Recomendations: Calories: 1600 Carbohydrate: 180 Protein: 120 Fat: 44  Demonstrated degree of understanding via:  Teach Back  Teaching Method Utilized:  Visual Auditory Hands on  Barriers to learning/adherence to lifestyle change: none identified   Patient to call the Nutrition and Diabetes Education Services to enroll in Pre-Op and Post-Op Nutrition Education when surgery date is scheduled.

## 2016-11-15 NOTE — Patient Instructions (Addendum)
-  Call the doctor that prescribed the metformin and tell them about your bowel habits   -Take your metformin with meals  -Tell Dr. Hassell Done about your new diagnosis and the recent surgery

## 2016-11-16 ENCOUNTER — Ambulatory Visit (HOSPITAL_COMMUNITY)
Admission: RE | Admit: 2016-11-16 | Discharge: 2016-11-16 | Disposition: A | Discharge status: Home / Self Care | Payer: 59 | Source: Ambulatory Visit | Attending: Surgery | Admitting: Surgery

## 2016-11-16 ENCOUNTER — Encounter: Payer: Self-pay | Admitting: Obstetrics and Gynecology

## 2016-11-16 ENCOUNTER — Ambulatory Visit (HOSPITAL_COMMUNITY): Payer: 59

## 2016-11-16 ENCOUNTER — Ambulatory Visit (INDEPENDENT_AMBULATORY_CARE_PROVIDER_SITE_OTHER): Payer: 59 | Admitting: Obstetrics and Gynecology

## 2016-11-16 VITALS — BP 130/92 | HR 96 | Resp 16 | Ht 68.0 in | Wt 300.0 lb

## 2016-11-16 DIAGNOSIS — L0292 Furuncle, unspecified: Secondary | ICD-10-CM

## 2016-11-16 DIAGNOSIS — K449 Diaphragmatic hernia without obstruction or gangrene: Secondary | ICD-10-CM | POA: Diagnosis not present

## 2016-11-16 DIAGNOSIS — Z6841 Body Mass Index (BMI) 40.0 and over, adult: Secondary | ICD-10-CM | POA: Diagnosis not present

## 2016-11-16 DIAGNOSIS — I4510 Unspecified right bundle-branch block: Secondary | ICD-10-CM | POA: Diagnosis not present

## 2016-11-16 DIAGNOSIS — Z01818 Encounter for other preprocedural examination: Secondary | ICD-10-CM | POA: Diagnosis not present

## 2016-11-16 DIAGNOSIS — K429 Umbilical hernia without obstruction or gangrene: Secondary | ICD-10-CM | POA: Diagnosis not present

## 2016-11-16 DIAGNOSIS — K224 Dyskinesia of esophagus: Secondary | ICD-10-CM | POA: Diagnosis not present

## 2016-11-16 DIAGNOSIS — I498 Other specified cardiac arrhythmias: Secondary | ICD-10-CM | POA: Diagnosis not present

## 2016-11-16 MED ORDER — SULFAMETHOXAZOLE-TRIMETHOPRIM 800-160 MG PO TABS: 1.0000 | ORAL_TABLET | Freq: Two times a day (BID) | ORAL | 0 refills | Status: DC

## 2016-11-16 NOTE — Progress Notes (Signed)
GYNECOLOGY  VISIT   HPI: 36 y.o.   Married  Caucasian  female   [id number] with No LMP recorded. Patient is not currently having periods (Reason: IUD).   here for  F/u. She is s/p laparoscopy with LOA and LSO 2.5 weeks ago. She had some post op issues with GI related pain and bowel changes. Earlier this week she was c/o diarrhea and had a negative C. Diff culture.  She is feeling better today. Her lower midline incision is still tender, but improving. Other post op pain is better. Diarrhea is still there, but is improving.  The boil on her mons popped this morning and is draining, it has been very painful. No fevers.   GYNECOLOGIC HISTORY: No LMP recorded. Patient is not currently having periods (Reason: IUD). Contraception: IUD  Menopausal hormone therapy: none        OB History    Gravida Para Term Preterm AB Living   '3 1 1   2 1   '$ SAB TAB Ectopic Multiple Live Births   2       1         Patient Active Problem List   Diagnosis Date Noted  . Healthcare maintenance 10/18/2016  . Type 2 diabetes mellitus without complications ([place]) 02/20/5101  . LOW BACK PAIN, ACUTE 11/20/2006  . POLYCYSTIC OVARY 07/04/2006  . OBESITY, NOS 07/04/2006  . DEPRESSIVE DISORDER, NOS 07/04/2006  . TOBACCO USE, QUIT 07/04/2006    Past Medical History:  Diagnosis Date  . Abnormal uterine bleeding   . Amenorrhea   . Anemia   . Anxiety    panic attacks  . Diabetes mellitus without complication ([place])    type 2  . Dysmenorrhea   . GERD (gastroesophageal reflux disease)    occasional - diet controlled and tums prn  . Hyperlipidemia    no meds -diet controlled  . Infertility, female   . Insulin resistance   . Migraine without aura   . Missed abortion    x 2 - both resolved without surgery  . Obesity   . PCOS (polycystic ovarian syndrome)   . PCOS (polycystic ovarian syndrome)   . PONV (postoperative nausea and vomiting)   . SVD (spontaneous vaginal delivery)    x 1  . Urinary incontinence      Past Surgical History:  Procedure Laterality Date  . CHOLECYSTECTOMY    . COLONOSCOPY  2017   polyps  . CYSTOSCOPY N/A 10/29/2016   Procedure: CYSTOSCOPY;  Surgeon: Salvadore Dom, MD;  Location: [facility];  Service: Gynecology;  Laterality: N/A;  . DILATATION & CURRETTAGE/HYSTEROSCOPY WITH RESECTOCOPE N/A 10/17/2012   Procedure: DILATATION & CURETTAGE/HYSTEROSCOPY WITH RESECTOCOPE; Possible Polypectomy, Possible Resectoscopic Myomectomy.;  Surgeon: Floyce Stakes. Pamala Hurry, MD;  Location: [facility];  Service: Gynecology;  Laterality: N/A;  1 hr.  Marland Kitchen DILATION AND CURETTAGE OF UTERUS    . ENDOMETRIAL BIOPSY    . ESOPHAGOGASTRODUODENOSCOPY ENDOSCOPY  2017   Normal  . HYSTEROSCOPY W/D&C N/A 12/01/2013   Procedure: IUD Removal;  Surgeon: Claiborne Billings A. Pamala Hurry, MD;  Location: [facility];  Service: Gynecology;  Laterality: N/A;  90 min.  . INTRAUTERINE DEVICE (IUD) INSERTION N/A 10/17/2012   Procedure: INTRAUTERINE DEVICE (IUD) INSERTION; Mirena;  Surgeon: Floyce Stakes. Pamala Hurry, MD;  Location: [facility];  Service: Gynecology;  Laterality: N/A;  . LAPAROSCOPIC SALPINGO OOPHERECTOMY Left 10/29/2016   Procedure: LAPAROSCOPIC SALPINGO OOPHORECTOMY With anterior uterine biopsy;  Surgeon: Salvadore Dom, MD;  Location: [facility];  Service: Gynecology;  Laterality: Left;  . MOUTH SURGERY     wisdom teeth    Current Outpatient Prescriptions  Medication Sig Dispense Refill  . blood glucose meter kit and supplies KIT Dispense based on patient and insurance preference. Use up to four times daily as directed. (FOR ICD-9 250.00, 250.01). 1 each 0  . calcium carbonate (TUMS - DOSED IN MG ELEMENTAL CALCIUM) 500 MG chewable tablet Chew 3 tablets by mouth daily as needed for indigestion or heartburn.    . Cholecalciferol (D3-50) 50000 units capsule TAKE ONE CAPSULE BY MOUTH ONCE A WEEK FOR LOW VITAMIN D 12 capsule 3  . citalopram (CELEXA) 20 MG tablet Take 1 tablet po a day 90 tablet 3  . diazepam (VALIUM) 5 MG tablet Take 1  tablet (5 mg total) by mouth every 8 (eight) hours as needed for anxiety. 60 tablet 0  . dicyclomine (BENTYL) 20 MG tablet Take 1 tablet (20 mg total) by mouth 2 (two) times daily. 20 tablet 0  . ibuprofen (ADVIL,MOTRIN) 800 MG tablet Take 1 tablet (800 mg total) by mouth every 8 (eight) hours as needed. 30 tablet 1  . levonorgestrel (MIRENA) 20 MCG/24HR IUD 1 each by Intrauterine route once.    . metFORMIN (GLUCOPHAGE) 500 MG tablet Take 1 tablet (500 mg total) by mouth 2 (two) times daily with a meal. Start by just taking 1 tablet with dinner for the first two weeks, then twice daily. 180 tablet 3  . Omega Fatty Acids-Vitamins (OMEGA-3 GUMMIES) CHEW Chew by mouth.    . Probiotic Product (DIGESTIVE ADVANTAGE GUMMIES) CHEW Chew by mouth.    . promethazine (PHENERGAN) 25 MG tablet Take 1 tablet (25 mg total) by mouth every 6 (six) hours as needed for nausea or vomiting. 30 tablet 0  . SUMAtriptan (IMITREX) 100 MG tablet Take 1 tablet (100 mg total) by mouth once. May repeat in 2 hours if headache persists or recurs.  Take a maximum of 2 tablets per 24 hour period. 15 tablet 0   No current facility-administered medications for this visit.      ALLERGIES: Iohexol  Family History  Problem Relation Age of Onset  . Diabetes Father   . Thyroid disease Father   . Hypertension Father   . Diabetes Maternal Grandmother   . Heart disease Maternal Grandmother   . Diabetes Maternal Grandfather   . Heart disease Maternal Grandfather   . Stroke Maternal Grandfather   . Diabetes Paternal Grandmother   . Diabetes Paternal Grandfather   . Heart disease Paternal Grandfather   . Alcohol abuse Paternal Grandfather   . Diabetes Other   . Obesity Other   . Sleep apnea Other   . Hyperlipidemia Other   . Hypertension Other   . Cancer Other   . Heart disease Other   . Hypertension Mother     Social History   Social History  . Marital status: Married    Spouse name: N/A  . Number of children: N/A  .  Years of education: N/A   Occupational History  . Not on file.   Social History Main Topics  . Smoking status: Former Smoker    Packs/day: 0.25    Years: 19.00    Types: Cigarettes    Quit date: 06/06/2016  . Smokeless tobacco: Never Used  . Alcohol use Yes     Comment: occasional  . Drug use: No  . Sexual activity: Yes    Partners: Male    Birth control/ protection: None, IUD  Other Topics Concern  . Not on file   Social History Narrative  . No narrative on file    Review of Systems  Constitutional: Negative.   HENT: Negative.   Eyes: Negative.   Respiratory: Negative.   Cardiovascular: Negative.   Gastrointestinal: Negative.   Genitourinary: Negative.   Musculoskeletal: Negative.   Skin: Negative.   Neurological: Negative.   Endo/Heme/Allergies: Negative.   Psychiatric/Behavioral: Negative.     PHYSICAL EXAMINATION:    BP (!) 130/92 (BP Location: Right Arm, Patient Position: Sitting, Cuff Size: Large)   Pulse 96   Resp 16   Ht '5\' 8"'$  (1.727 m)   Wt 300 lb (136.1 kg)   BMI 45.61 kg/m     General appearance: alert, cooperative and appears stated age Abdomen: soft, non-tender; non distended, no masses,  no organomegaly Incisions: midline lower abdominal incision with induration, c/w hematoma, slightly tender, no erythema, no drainage. Other incisions are without induration, drainage or erythema.   Pelvic: External genitalia: on the left mons is a draining abscess, indurated, area of erythema around 2 cm, very tender  ASSESSMENT Post op s/p laparoscopy, LSO, LOA, doing well Boil on mons, draining but with erythema, indurated    PLAN Return to work on Monday Will continue to do hot soaks, warm compresses for the boil Will add antibiotics because of her recent surgery and surrounding cellulitis   An After Visit Summary was printed and given to the patient.

## 2016-11-20 ENCOUNTER — Encounter: Payer: Self-pay | Admitting: Neurology

## 2016-11-20 ENCOUNTER — Ambulatory Visit (INDEPENDENT_AMBULATORY_CARE_PROVIDER_SITE_OTHER): Payer: 59 | Admitting: Neurology

## 2016-11-20 VITALS — BP 122/86 | HR 78 | Ht 68.0 in | Wt 303.0 lb

## 2016-11-20 DIAGNOSIS — E669 Obesity, unspecified: Secondary | ICD-10-CM | POA: Diagnosis not present

## 2016-11-20 DIAGNOSIS — R0683 Snoring: Secondary | ICD-10-CM

## 2016-11-20 DIAGNOSIS — E119 Type 2 diabetes mellitus without complications: Secondary | ICD-10-CM

## 2016-11-20 DIAGNOSIS — G4710 Hypersomnia, unspecified: Secondary | ICD-10-CM

## 2016-11-20 DIAGNOSIS — G4730 Sleep apnea, unspecified: Secondary | ICD-10-CM

## 2016-11-20 NOTE — Patient Instructions (Signed)
Bariatric Surgery Information Bariatric surgery, also called weight loss surgery, is a procedure that helps you lose weight. You may consider or your health care provider may suggest bariatric surgery if:  You are severely obese and have been unable to lose weight through diet and exercise.  You have health problems related to obesity, such as: ? Type 2 diabetes. ? Heart disease. ? Lung disease.  How does bariatric surgery help me lose weight? Bariatric surgery helps you lose weight by decreasing how much food your body absorbs. This is done by closing off part of your stomach to make it smaller. This restricts the amount of food your stomach can hold. Bariatric surgery can also change your body's regular digestive process, so that food bypasses the parts of your body that absorb calories and nutrients. If you decide to have bariatric surgery, it is important to continue to eat a healthy diet and exercise regularly after the surgery. What are the different kinds of bariatric surgery? There are two kinds of bariatric surgeries:  Restrictive surgeries make your stomach smaller. They do not change your digestive process. The smaller the size of your new stomach, the less food you can eat. There are different types of restrictive surgeries.  Malabsorptive surgeries both make your stomach smaller and alter your digestive process so that your body processes less calories and nutrients. These are the most common kind of bariatric surgery. There are different types of malabsorptive surgeries.  What are the different types of restrictive surgery? Adjustable Gastric Banding In this procedure, an inflatable band is placed around your stomach near the upper end. This makes the passageway for food into the rest of your stomach much smaller. The band can be adjusted, making it tighter or looser, by filling it with salt solution. Your surgeon can adjust the band based on how are you feeling and how much  weight you are losing. The band can be removed in the future. Vertical Banded Gastroplasty In this procedure, staples are used to separate your stomach into two parts, a small upper pouch and a bigger lower pouch. This decreases how much food you can eat. Sleeve Gastrectomy In this procedure, your stomach is made smaller. This is done by surgically removing a large part of your stomach. When your stomach is smaller, you feel full more quickly and reduce how much you eat. What are the different types of malabsorptive surgery? Roux-en-Y Gastric Bypass (RGB) This is the most common weight loss surgery. In this procedure, a small stomach pouch is created in the upper part of your stomach. Next, this small stomach pouch is attached directly to the middle part of your small intestine. The farther down your small intestine the new connection is made, the fewer calories and nutrients you will absorb. Biliopancreatic Diversion with Duodenal Switch (BPD/DS) This is a multi-step procedure. In this procedure, a large part of your stomach is removed, making your stomach smaller. Next, this smaller stomach is attached to the lower part of your small intestine. Like the RGB surgery, you absorb fewer calories and nutrients the farther down your small intestine the attachment is made. What are the risks of bariatric surgery? As with any surgical procedure, each type of bariatric surgery has its own risks. These risks also depend on your age, your overall health, and any other medical conditions you may have. When deciding on bariatric surgery, it is very important to:  Talk to your health care provider and choose the surgery that is best for   you.  Ask your health care provider about specific risks for the surgery you choose.  Where to find more information:  American Society for Metabolic & Bariatric Surgery: www.asmbs.org  Weight-control Information Network (WIN): win.niddk.nih.gov This information is not  intended to replace advice given to you by your health care provider. Make sure you discuss any questions you have with your health care provider. Document Released: 04/23/2005 Document Revised: 09/29/2015 Document Reviewed: 10/22/2012 Elsevier Interactive Patient Education  2017 [employer]  

## 2016-11-20 NOTE — Progress Notes (Signed)
SLEEP MEDICINE CLINIC   Provider:  Larey Seat, M D  Primary Care Physician:  Velna Hatchet, MD   Referring Provider: Dr Joycelyn Rua, MD Gynaecology    Chief Complaint  Patient presents with  . New Patient (Initial Visit)    post op MD noticed pt stopped breathing and snoring    HPI:  [name] [name] is a 36 y.o. female , seen here as in a referral  from  OB/GYN Dr. 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 On 10/29/2016 she underwent gynecological surgery and it was after the surgery was completed that her gynecologist noted her irregular breathing and delayed recovery. It was Dr. Joycelyn Rua who then initiated to sleep consult to rule out apnea. She does snore at home has been told by her husband, and has never been prior evaluated for sleep apnea. She reports no problems with orthopnea, she can lay flat can sleep on one pillow. Recent Hgb A1c in May of this year was 7.2 when her bariatric surgeon evaluated her. She is considered diabetic. She also carries a diagnosis of polycystic ovarian syndrome, depression, remote tobacco use she quit in 2018, she still suffers occasional anxiety attacks, is considered anemia because of dysmenorrhagia. She has had migraine without aura and urinary incontinence.  The patient also is considered morbidly obese, and is planning on having bariatric surgery at the end of the year. Her interest is in a sleep gastrectomy.  She changed her diet and she is now working out 4 times a week.   Sleep habits are as follows: [name] [name] reports that she sleeps in a cool, quiet and dark room and can sleep on one pillow, usually falls asleep on her side. She usually goes to bed between 9 and 10 PM but once she is asleep she cannot sleep through the night. There are 2-3 bathroom breaks each night, she also snores herself awake and has been choking in her sleep. She reports having vivid dreams that are usually unpleasant rather threatening  or nightmarish in content. She reports having vivid dreams since childhood, not strictly related to her more recent diagnoses of anxiety and depression. She usually wakes up spontaneously around 6 AM, just before her alarm rings and sometimes wakes without alarm ringing. She reports not feeling refreshed or restored in the morning she reports a very dry mouth, hip pain and lower back pain also present, and she wakes up with headaches frequently.  Sleep medical history and family sleep history:  The patient reports that she suffered from night terrors as a child, she would scream loudly and it was difficult for her family to calm her down or wake her. Her father has been diagnosed with obstructive sleep apnea and uses CPAP, her sister tested negative for OSA, paternal aunt has obstructive sleep apnea as well. The patient's sister had bariatric surgery, so did the paternal aunt. No ENT surgeries such as tonsillectomy and septoplasty have taken Place, she does not have a history of traumatic brain injury neck injury or surgery to the airway.   Social history:   Forensic psychologist.The patient has one biological child a son 20 years old. 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 She quit smoking in March 2018 used to smoke 1 pack per day. Alcohol use-1-3 drinks a month, caffeine use one caffeinated beverage a week now, small amounts of chocolate   Review of Systems: Out of a  complete 14 system review, the patient complains of only the following symptoms, and all other reviewed systems are negative. Sleep headaches in the last 3 month, snoring, apnea, vivid dreams.   Epworth score 12 , Fatigue severity score 40  , depression score n/a    Social History   Social History  . Marital status: Married    Spouse name: N/A  . Number of children: N/A  . Years of education: N/A   Occupational History  . Not on file.   Social History Main Topics  . Smoking status: Former  Smoker    Packs/day: 0.25    Years: 19.00    Types: Cigarettes    Quit date: 06/06/2016  . Smokeless tobacco: Never Used  . Alcohol use Yes     Comment: occasional  . Drug use: No  . Sexual activity: Yes    Partners: Male    Birth control/ protection: None, IUD   Other Topics Concern  . Not on file   Social History Narrative  . No narrative on file    Family History  Problem Relation Age of Onset  . Diabetes Father   . Thyroid disease Father   . Hypertension Father   . Diabetes Maternal Grandmother   . Heart disease Maternal Grandmother   . Diabetes Maternal Grandfather   . Heart disease Maternal Grandfather   . Stroke Maternal Grandfather   . Diabetes Paternal Grandmother   . Diabetes Paternal Grandfather   . Heart disease Paternal Grandfather   . Alcohol abuse Paternal Grandfather   . Diabetes Other   . Obesity Other   . Sleep apnea Other   . Hyperlipidemia Other   . Hypertension Other   . Cancer Other   . Heart disease Other   . Hypertension Mother     Past Medical History:  Diagnosis Date  . Abnormal uterine bleeding   . Amenorrhea   . Anemia   . Anxiety    panic attacks  . Diabetes mellitus without complication ([place])    type 2  . Dysmenorrhea   . GERD (gastroesophageal reflux disease)    occasional - diet controlled and tums prn  . Hyperlipidemia    no meds -diet controlled  . Infertility, female   . Insulin resistance   . Migraine without aura   . Missed abortion    x 2 - both resolved without surgery  . Obesity   . PCOS (polycystic ovarian syndrome)   . PCOS (polycystic ovarian syndrome)   . PONV (postoperative nausea and vomiting)   . SVD (spontaneous vaginal delivery)    x 1  . Urinary incontinence     Past Surgical History:  Procedure Laterality Date  . CHOLECYSTECTOMY    . COLONOSCOPY  2017   polyps  . CYSTOSCOPY N/A 10/29/2016   Procedure: CYSTOSCOPY;  Surgeon: Salvadore Dom, MD;  Location: [facility];  Service: Gynecology;   Laterality: N/A;  . DILATATION & CURRETTAGE/HYSTEROSCOPY WITH RESECTOCOPE N/A 10/17/2012   Procedure: DILATATION & CURETTAGE/HYSTEROSCOPY WITH RESECTOCOPE; Possible Polypectomy, Possible Resectoscopic Myomectomy.;  Surgeon: Floyce Stakes. Pamala Hurry, MD;  Location: [facility];  Service: Gynecology;  Laterality: N/A;  1 hr.  Marland Kitchen DILATION AND CURETTAGE OF UTERUS    . ENDOMETRIAL BIOPSY    . ESOPHAGOGASTRODUODENOSCOPY ENDOSCOPY  2017   Normal  . HYSTEROSCOPY W/D&C N/A 12/01/2013   Procedure: IUD Removal;  Surgeon: Claiborne Billings A. Pamala Hurry, MD;  Location: [facility];  Service: Gynecology;  Laterality: N/A;  90 min.  . INTRAUTERINE  DEVICE (IUD) INSERTION N/A 10/17/2012   Procedure: INTRAUTERINE DEVICE (IUD) INSERTION; Mirena;  Surgeon: Floyce Stakes. Pamala Hurry, MD;  Location: [facility];  Service: Gynecology;  Laterality: N/A;  . LAPAROSCOPIC SALPINGO OOPHERECTOMY Left 10/29/2016   Procedure: LAPAROSCOPIC SALPINGO OOPHORECTOMY With anterior uterine biopsy;  Surgeon: Salvadore Dom, MD;  Location: [facility];  Service: Gynecology;  Laterality: Left;  . MOUTH SURGERY     wisdom teeth    Current Outpatient Prescriptions  Medication Sig Dispense Refill  . blood glucose meter kit and supplies KIT Dispense based on patient and insurance preference. Use up to four times daily as directed. (FOR ICD-9 250.00, 250.01). 1 each 0  . calcium carbonate (TUMS - DOSED IN MG ELEMENTAL CALCIUM) 500 MG chewable tablet Chew 3 tablets by mouth daily as needed for indigestion or heartburn.    . Cholecalciferol (D3-50) 50000 units capsule TAKE ONE CAPSULE BY MOUTH ONCE A WEEK FOR LOW VITAMIN D 12 capsule 3  . citalopram (CELEXA) 20 MG tablet Take 1 tablet po a day 90 tablet 3  . diazepam (VALIUM) 5 MG tablet Take 1 tablet (5 mg total) by mouth every 8 (eight) hours as needed for anxiety. 60 tablet 0  . dicyclomine (BENTYL) 20 MG tablet Take 1 tablet (20 mg total) by mouth 2 (two) times daily. 20 tablet 0  . ibuprofen (ADVIL,MOTRIN) 800 MG tablet Take 1  tablet (800 mg total) by mouth every 8 (eight) hours as needed. 30 tablet 1  . levonorgestrel (MIRENA) 20 MCG/24HR IUD 1 each by Intrauterine route once.    . metFORMIN (GLUCOPHAGE) 500 MG tablet Take 1 tablet (500 mg total) by mouth 2 (two) times daily with a meal. Start by just taking 1 tablet with dinner for the first two weeks, then twice daily. 180 tablet 3  . Omega Fatty Acids-Vitamins (OMEGA-3 GUMMIES) CHEW Chew by mouth.    . Probiotic Product (DIGESTIVE ADVANTAGE GUMMIES) CHEW Chew by mouth.    . promethazine (PHENERGAN) 25 MG tablet Take 1 tablet (25 mg total) by mouth every 6 (six) hours as needed for nausea or vomiting. 30 tablet 0  . sulfamethoxazole-trimethoprim (BACTRIM DS) 800-160 MG tablet Take 1 tablet by mouth 2 (two) times daily. One PO BID x 7 days 14 tablet 0  . SUMAtriptan (IMITREX) 100 MG tablet Take 1 tablet (100 mg total) by mouth once. May repeat in 2 hours if headache persists or recurs.  Take a maximum of 2 tablets per 24 hour period. 15 tablet 0   No current facility-administered medications for this visit.     Allergies as of 11/20/2016 - Review Complete 11/20/2016  Allergen Reaction Noted  . Iohexol Hives 10/18/2008    Vitals: BP 122/86   Pulse 78   Ht '5\' 8"'$  (1.727 m)   Wt (!) 303 lb (137.4 kg)   BMI 46.07 kg/m  Last Weight:  Wt Readings from Last 1 Encounters:  11/20/16 (!) 303 lb (137.4 kg)   ERD:EYCX mass index is 46.07 kg/m.     Last Height:   Ht Readings from Last 1 Encounters:  11/20/16 '5\' 8"'$  (1.727 m)    Physical exam:  General: The patient is awake, alert and appears not in acute distress. The patient is well groomed. Head: Normocephalic, atraumatic. Neck is supple. Mallampati 4  neck circumference: 17.5 . Nasal airflow patent -, Retrognathia is not seen.  Cardiovascular:  Regular rate and rhythm , without  murmurs or carotid bruit, and without distended neck veins. Respiratory: Lungs  are clear to auscultation. Skin:  Without evidence of  edema, or rash Trunk: BMI is 46. The patient's posture is erect   Neurologic exam : The patient is awake and alert, oriented to place and time.   Cranial nerves: Pupils are equal and briskly reactive to light. Funduscopic exam without  evidence of pallor or edema. Extraocular movements  in vertical and horizontal planes intact and without nystagmus. Visual fields by finger perimetry are intact.Hearing to finger rub intact. Facial sensation intact to fine touch.Facial motor strength is symmetric and tongue and uvula move midline. Shoulder shrug was symmetrical.   Motor exam: Normal tone, muscle bulk and symmetric strength in all extremities. Sensory:  Fine touch, pinprick and vibration were tested in all extremities. Proprioception tested in the upper extremities was normal. pateint reports pain in her thumbs and numbness in the bilateral fingers 4 and 5.  Coordination: Rapid alternating movements in the fingers/hands was normal. Finger-to-nose maneuver  normal without evidence of ataxia, dysmetria or tremor. Gait and station: Patient walks without assistive device  Stance is stable and normal.  Turns with 3  Steps. Romberg testing is negative. Deep tendon reflexes: in the  upper and lower extremities are symmetric and intact. Babinski maneuver response is downgoing.    Assessment:  After physical and neurologic examination, review of laboratory studies,  Personal review of imaging studies, reports of other /same  Imaging studies, results of polysomnography and / or neurophysiology testing and pre-existing records as far as provided in visit., my assessment is   1) [name] [name] has multiple high risk factors for obstructive sleep apnea including a positive family history, body mass index, Mallampati and neck circumference and the witnessed report of apnea and snoring after a surgical procedure.  The patient also reports frequent headaches in the morning as well as headaches that sometimes wake her  from sleep. These are more common in obese patients, in smokers and can be related to the retained movement of CO2 or hypoxemia at night. They can also wake patients during slow wave sleep.    Plan:  Treatment plan and additional workup :My goal is to evaluate her in a sleep study not just for the presence of apnea but also of hypoxemia and hypercapnia.  Since she is interested in a bariatric surgery I will invite her for an attended split-night polysomnography and If CPAP is needed, I will send her home with an auto-titrator . SPLIT at AHI 20.   The patient was advised of the nature of the diagnosed disorder , the treatment options and the  risks for general health and wellness arising from not treating the condition.  I spent more than 40 minutes of face to face time with the patient.  Greater than 50% of time was spent in counseling and coordination of care. We have discussed the diagnosis and differential and I answered the patient's questions.         Larey Seat, MD 3/38/3291, 91:66 AM  Certified in Neurology by ABPN Certified in [place] by [facility] [address], [place], [place] [address]

## 2016-11-26 ENCOUNTER — Ambulatory Visit (HOSPITAL_COMMUNITY): Payer: No Typology Code available for payment source

## 2016-11-27 ENCOUNTER — Other Ambulatory Visit: Payer: 59

## 2016-11-29 ENCOUNTER — Ambulatory Visit (INDEPENDENT_AMBULATORY_CARE_PROVIDER_SITE_OTHER): Payer: 59 | Admitting: Neurology

## 2016-11-29 DIAGNOSIS — E119 Type 2 diabetes mellitus without complications: Secondary | ICD-10-CM

## 2016-11-29 DIAGNOSIS — G4710 Hypersomnia, unspecified: Secondary | ICD-10-CM

## 2016-11-29 DIAGNOSIS — R0683 Snoring: Secondary | ICD-10-CM

## 2016-11-29 DIAGNOSIS — G4730 Sleep apnea, unspecified: Secondary | ICD-10-CM

## 2016-11-29 DIAGNOSIS — E662 Morbid (severe) obesity with alveolar hypoventilation: Secondary | ICD-10-CM

## 2016-11-29 DIAGNOSIS — Z6841 Body Mass Index (BMI) 40.0 and over, adult: Principal | ICD-10-CM

## 2016-11-29 DIAGNOSIS — E669 Obesity, unspecified: Secondary | ICD-10-CM

## 2016-12-05 ENCOUNTER — Telehealth: Payer: Self-pay | Admitting: Neurology

## 2016-12-05 NOTE — Telephone Encounter (Signed)
Will read study tonight. CD

## 2016-12-05 NOTE — Telephone Encounter (Signed)
Patient called and wants results so she can get the second part of her study completed because her surgery is coming up soon.

## 2016-12-05 NOTE — Procedures (Signed)
PATIENT'S NAME:  [name] [name], [name] [name] DOB:      April 11, 1981      MR#:    [id number]     DATE OF RECORDING: 11/29/2016 REFERRING M.D.:  Joycelyn Rua, MD OB/GYN Study Performed:   Baseline Polysomnogram HISTORY:  Reported Sleep headaches, Snoring, Apnea, EDS and Vivid Dreams in a super-obesity patient. The patient endorsed the Epworth Sleepiness Scale at 12/24 points. Pre bariatric surgery evaluation in a patient with witnessed apnea after a clinical procedure.   The patient's weight 303 pounds with a height of 68 (inches), resulting in a BMI of 45.8 kg/m2. The patient's neck circumference measured 17.5 inches.  CURRENT MEDICATIONS: Blood glucose meter, Tums, Cholecalciferol, Celexa, Valium, Bentyl, Advil, Mirena, Glucophage, Omega-3, Probiotic, Phenergan, Bactrim, Imitrex.   PROCEDURE:  This is a multichannel digital polysomnogram utilizing the SomnoStar 11.2 system.  Electrodes and sensors were applied and monitored per AASM Specifications.   EEG, EOG, Chin and Limb EMG, were sampled at 200 Hz.  ECG, Snore and Nasal Pressure, Thermal Airflow, Respiratory Effort, CPAP Flow and Pressure, Oximetry was sampled at 50 Hz. Digital video and audio were recorded.      BASELINE STUDY  Lights Out was at 21:15 and Lights On at 04:57.  Total recording time (TRT) was 462.5 minutes, with a total sleep time (TST) of  394 minutes.   The patient's sleep latency was 59 minutes.  REM latency was 213 minutes.  The sleep efficiency was 85.2 %.     SLEEP ARCHITECTURE: WASO (Wake after sleep onset) was 9 minutes.  There were 10.5 minutes in Stage N1, 234.5 minutes Stage N2, 89.5 minutes Stage N3 and 59.5 minutes in Stage REM.  The percentage of Stage N1 was 2.7%, Stage N2 was 59.5%, Stage N3 was 22.7% and Stage R (REM sleep) was 15.1%.   RESPIRATORY ANALYSIS:  There were a total of 122 respiratory events:  18 obstructive apneas, 1 central apnea and 103 hypopneas with 0 respiratory event related arousals (RERAs).     The total  APNEA/HYPOPNEA INDEX (AHI) was 18.6/hour and the total RESPIRATORY DISTURBANCE INDEX was 18.6 /hour.  18 events occurred in REM sleep and 185 events in NREM. The REM AHI was 18.2 /hour, versus a non-REM AHI of 18.7. The patient spent 256 minutes of total sleep time in the supine position and 138 minutes in non-supine. The supine AHI was 21.6 versus a non-supine AHI of 13.0.  OXYGEN SATURATION & C02:  The Wake baseline 02 saturation was 98%, with the lowest being 91%. Time spent below 89% saturation equaled 0 minutes. End Tidal CO2 during sleep was 35.1 torr.  During REM, the End Tidal CO2 was 35.8 torr.  During NREM, the average End Tidal CO2 was 34.9 torr.  Total sleep time greater than 40 torr was 0.30 minutes.   PERIODIC LIMB MOVEMENTS:   The patient had a total of 0 Periodic Limb Movements.  The arousals were noted as: 31 were spontaneous, 0 were associated with PLMs, and 29 were associated with respiratory events.  Audio and video analysis did not show any abnormal or unusual movements, behaviors, phonations or vocalizations.   Moderate Snoring was noted. EKG was in keeping with normal sinus rhythm (NSR).  IMPRESSION:  1. Mild- moderate Obstructive Sleep Apnea (OSA) with snoring and hypercapnia, not hypoxemia.  RECOMMENDATIONS:  1. Advise ASAP  full-night, attended, CPAP titration study to optimize therapy before surgery.  2. Further information regarding OSA may be obtained from [employer] (www.sleepfoundation.org) or American Sleep  Apnea Association (www.sleepapnea.org). 3. A follow up appointment will be scheduled in the Sleep Clinic at [facility]. The referring provider will be notified of the results.      I certify that I have reviewed the entire raw data recording prior to the issuance of this report in accordance with the Standards of Accreditation of the American Academy of Sleep Medicine (AASM)     Larey Seat, MD      12-06-2015 Diplomat, American Board of Psychiatry and Neurology  Diplomat, American Board of [place] Director, [place] [facility] at [employer]

## 2016-12-05 NOTE — Telephone Encounter (Signed)
Sleep study report called and patients surgery is coming up. Dr Dohmeier is wanting the pt to come in for a titration study as soon as possible. Will let the sleep lab know and told the pt that they will be in contact with her.pt verbalized understanding.

## 2016-12-05 NOTE — Addendum Note (Signed)
Addended by: Larey Seat on: 12/05/2016 05:08 PM   Modules accepted: Orders

## 2016-12-05 NOTE — Telephone Encounter (Signed)
-----   Message from Larey Seat, MD sent at 12/05/2016  5:08 PM EDT ----- Urgent return for CPAP titration- see above findings of apnea and hypercapnia.   Patient was referred by her OB/GYN Dr Stan Head

## 2016-12-06 ENCOUNTER — Telehealth: Payer: Self-pay | Admitting: Obstetrics and Gynecology

## 2016-12-06 NOTE — Telephone Encounter (Signed)
Spoke with the patient she is aware of her need for CPAP and has been scheduled for a CPAP titration study.

## 2016-12-14 ENCOUNTER — Ambulatory Visit (INDEPENDENT_AMBULATORY_CARE_PROVIDER_SITE_OTHER): Payer: 59 | Admitting: Neurology

## 2016-12-14 DIAGNOSIS — E119 Type 2 diabetes mellitus without complications: Secondary | ICD-10-CM

## 2016-12-14 DIAGNOSIS — E669 Obesity, unspecified: Secondary | ICD-10-CM | POA: Diagnosis not present

## 2016-12-14 DIAGNOSIS — G4733 Obstructive sleep apnea (adult) (pediatric): Secondary | ICD-10-CM | POA: Diagnosis not present

## 2016-12-14 DIAGNOSIS — E662 Morbid (severe) obesity with alveolar hypoventilation: Secondary | ICD-10-CM

## 2016-12-14 DIAGNOSIS — Z6841 Body Mass Index (BMI) 40.0 and over, adult: Secondary | ICD-10-CM

## 2016-12-18 DIAGNOSIS — H52223 Regular astigmatism, bilateral: Secondary | ICD-10-CM | POA: Diagnosis not present

## 2016-12-18 LAB — HM DIABETES EYE EXAM

## 2016-12-19 ENCOUNTER — Telehealth: Payer: Self-pay | Admitting: Neurology

## 2016-12-19 NOTE — Procedures (Signed)
PATIENT'S NAME:  [name] [name], [name] [name] DOB:      Jul 05, 1980      MR#:    [id number]     DATE OF RECORDING: 12/14/2016 REFERRING M.D.:  Velna Hatchet, MD Study Performed:   Titration to PAP HISTORY:  Pt returning for CPAP titration following a PSG at our lab, performed on 11/29/2016, with an AHI of 18.6/hr. and low Sp02 of 91%. The patient is in preparation for bariatric surgery.  The patient endorsed the Epworth Sleepiness Scale at 12 points and the Fatigue Score at 40 points.   The patient's weight 302 pounds with a height of 68 (inches), resulting in a BMI of 45.8 kg/m2. The patient's neck circumference measured 17 inches.  CURRENT MEDICATIONS: Blood glucose meter, Tums, Cholecalciferol, Celexa, Valium, Bentyl, Advil, Mirena, Glucophage, Omega-3, Probiotic, Phenergan, Bactrim, Imitrex.    PROCEDURE:  This is a multichannel digital polysomnogram utilizing the SomnoStar 11.2 system.  Electrodes and sensors were applied and monitored per AASM Specifications.   EEG, EOG, Chin and Limb EMG, were sampled at 200 Hz.  ECG, Snore and Nasal Pressure, Thermal Airflow, Respiratory Effort, CPAP Flow and Pressure, Oximetry was sampled at 50 Hz. Digital video and audio were recorded.      CPAP was initiated at 5 cmH20 with heated humidity per AASM split night standards and pressure was advanced to 6/6cmH20 because of hypopneas, apneas and desaturations. The technologist changed to Bilevel PAP at 8/4cm, advanced to AVPAP 15/5 cm, but achieved no central apnea control.  At a CPAP pressure of 6 cmH20, there was a reduction of the AHI to 0 with improvement of the SpO2 nadir to 94%.  Lights Out was at 00:02 and Lights On at 06:51. Total recording time (TRT) was 409 minutes, with a total sleep time (TST) of 137.5 minutes. The patient's sleep latency was 259 minutes with 0.5 minutes of wake time after sleep onset. REM latency was 305 minutes.  The sleep efficiency was 33.6 %.    SLEEP ARCHITECTURE: WASO (Wake after sleep  onset) was 212 minutes.  There were 21.5 minutes in Stage N1, 42 minutes Stage N2, 56.5 minutes Stage N3 and 17.5 minutes in Stage REM.  The percentage of Stage N1 was 15.6%, Stage N2 was 30.5%, Stage N3 was 41.1% and Stage R (REM sleep) was 12.7%. [] The sleep architecture was notable for sleep fragmentation.  RESPIRATORY ANALYSIS:  There was a total of 11 respiratory events: 0 obstructive apneas, 9 central apneas and 0 mixed apneas with a total of 9 apneas and an apnea index (AI) of 3.9 /hour. There were 2 hypopneas with a hypopnea index of .9/hour. The patient also had 0 respiratory event related arousals (RERAs).     The total APNEA/HYPOPNEA INDEX  (AHI) was 4.8 /hour and the total RESPIRATORY DISTURBANCE INDEX was 4.8 .hour  0 events occurred in REM sleep and 11 events in NREM. The REM AHI was 0 /hour versus a non-REM AHI of 5.5 /hour.  The patient spent 14.5 minutes of total sleep time in the supine position and 123 minutes in non-supine. The supine AHI was 41.4, versus a non-supine AHI of 0.5.  OXYGEN SATURATION & C02:  The baseline 02 saturation was 98%, with the lowest being 82%. Time spent below 89% saturation equaled 2 minutes.  PERIODIC LIMB MOVEMENTS:    The patient had a total of 0 Periodic Limb Movements. The arousals were noted as: 26 were spontaneous, 0 were associated with PLMs, and 11 were associated with respiratory events.  The patient took one bathroom break. Snoring was noted. EKG was in keeping with normal sinus rhythm (NSR).   Audio and video analysis did not show any abnormal or unusual movements, behaviors, phonations or vocalizations.     DIAGNOSIS 1. Obstructive Sleep Apnea, responding to CPAP at only 6 cm water. Higher pressures triggered central sleep apnea.     PLANS/RECOMMENDATIONS: in light of the upper airway resistance status and the sensitive settings needed to prevent central apneas from emerging under PAP therapy, I will order an auto titration CPAP machine,  but set to 6 cm water pressure without EPR.  The patient was fitted with a ResMed AirFit P10 small pillows apparatus.   A follow up appointment will be scheduled in the Sleep Clinic at [facility].   Please call [phone] with any questions.      I certify that I have reviewed the entire raw data recording prior to the issuance of this report in accordance with the Standards of Accreditation of the American Academy of Sleep Medicine (AASM)    Larey Seat, M.D.  12-19-2016  Diplomat, American Board of Psychiatry and Neurology  Diplomat, [place] of Sleep Medicine Medical Director, [place] [facility] at [facility]

## 2016-12-19 NOTE — Telephone Encounter (Signed)
Pt called the clinic wanting to results of titration study and if she needs to make a f/u appt. She said it is time sensitive with her surgery coming up and she said Dr Brett Fairy told her to call to remind her. She can be reached at [phone]

## 2016-12-19 NOTE — Telephone Encounter (Signed)
-----   Message from Larey Seat, MD sent at 12/19/2016  5:18 PM EDT ----- DIAGNOSIS 1. Obstructive Sleep Apnea, responding to CPAP at only 6 cm  water. Higher pressures triggered central sleep apnea.    PLANS/RECOMMENDATIONS: in light of the upper airway resistance  status and the sensitive settings needed to prevent central  apneas from emerging under PAP therapy, I will order an auto  titration CPAP machine, but set to 6 cm water pressure without  EPR.  The patient was fitted with a ResMed AirFit P10 small pillows  apparatus.

## 2016-12-19 NOTE — Addendum Note (Signed)
Addended by: Larey Seat on: 12/19/2016 05:18 PM   Modules accepted: Orders

## 2016-12-19 NOTE — Telephone Encounter (Signed)
I called [name] [name]. I advised [name] [name] that Dr. Brett Fairy reviewed their sleep study results and found that [name] [name] does have sleep apnea. Dr. Brett Fairy recommends that [name] [name] start CPAP. I reviewed PAP compliance expectations with the [name] [name]. [name] [name] is agreeable to starting a CPAP. I advised [name] [name] that an order will be sent to a DME, [facility], and [facility] will call the [name] [name] within about one week after they file with the [name] [name]'s insurance. [facility] will show the [name] [name] how to use the machine, fit for masks, and troubleshoot the CPAP if needed. A follow up appt was made for insurance purposes with Dr. Brett Fairy on March 11 2017 at 10:00am. [name] [name] verbalized understanding to arrive 15 minutes early and bring their CPAP. A letter with all of this information in it will be mailed to the [name] [name] as a reminder. I verified with the [name] [name] that the address we have on file is correct. [name] [name] verbalized understanding of results. [name] [name] had no questions at this time but was encouraged to call back if questions arise.

## 2016-12-24 NOTE — Telephone Encounter (Signed)
If Mrs. Capes likes to meet at [facility], I can work her in.

## 2017-01-01 ENCOUNTER — Encounter: Payer: Self-pay | Admitting: *Deleted

## 2017-01-02 ENCOUNTER — Telehealth: Payer: Self-pay | Admitting: *Deleted

## 2017-01-02 NOTE — Telephone Encounter (Signed)
Bariatric paperwork and LMN completed and faxed to [facility] per patient request. Copy given to patient for her records.   Routing to provider for final review. Patient agreeable to disposition. Will close encounter.

## 2017-01-03 ENCOUNTER — Ambulatory Visit (INDEPENDENT_AMBULATORY_CARE_PROVIDER_SITE_OTHER): Payer: 59 | Admitting: Psychiatry

## 2017-01-03 DIAGNOSIS — F509 Eating disorder, unspecified: Secondary | ICD-10-CM

## 2017-01-09 ENCOUNTER — Ambulatory Visit (INDEPENDENT_AMBULATORY_CARE_PROVIDER_SITE_OTHER): Payer: 59 | Admitting: Adult Health

## 2017-01-09 ENCOUNTER — Encounter: Payer: Self-pay | Admitting: Adult Health

## 2017-01-09 VITALS — BP 124/77 | HR 80 | Ht 68.0 in | Wt 296.0 lb

## 2017-01-09 DIAGNOSIS — E119 Type 2 diabetes mellitus without complications: Secondary | ICD-10-CM

## 2017-01-09 DIAGNOSIS — Z6841 Body Mass Index (BMI) 40.0 and over, adult: Secondary | ICD-10-CM

## 2017-01-09 DIAGNOSIS — G4733 Obstructive sleep apnea (adult) (pediatric): Secondary | ICD-10-CM | POA: Diagnosis not present

## 2017-01-09 DIAGNOSIS — G4730 Sleep apnea, unspecified: Secondary | ICD-10-CM | POA: Insufficient documentation

## 2017-01-09 LAB — POCT UA - MICROALBUMIN
Albumin/Creatinine Ratio, Urine, POC: <30
Creatinine, POC: 100 mg/dL
Microalbumin Ur, POC: 30 mg/L

## 2017-01-09 LAB — POCT GLYCOSYLATED HEMOGLOBIN (HGB A1C): Hemoglobin A1C: 6.4

## 2017-01-09 MED ORDER — CITALOPRAM HYDROBROMIDE 10 MG PO TABS: ORAL_TABLET | ORAL | 1 refills | Status: DC

## 2017-01-09 MED ORDER — PROMETHAZINE HCL 25 MG PO TABS: 25.0000 mg | ORAL_TABLET | Freq: Four times a day (QID) | ORAL | 2 refills | Status: DC | PRN

## 2017-01-09 NOTE — Progress Notes (Signed)
Subjective:    [name], female    DOB: Jun 09, 1980, 36 y.o.   MRN: [id number]  HPI:  [name] [name] is here for regular f/u: Morbid Obesity, T2D, and depression.  She underwent laparoscopic LOA, LSO, chromopertubation, and cystoscopy June 2018.  She developed post op GI pain with change in bowel habits.  Pain and constipation/diarrhea have both since resolved.   She reports medication compliance and only minor SE is occasional loose BMs. She has been following diabetic diet, drinking 60-100 ounces water/day and vigorous exercise 4days a week. She has lost 6 lbs since last OV and BP is excellent today! Her OB/GYN has referred her for bariatric surgery.  It was discovered that she has OSA over the summer and she will start using nightly CPAP this week.    Patient Care Team    Relationship Specialty Notifications Start End  Esaw Grandchild, NP PCP - General Family Medicine  10/18/16     Patient Active Problem List   Diagnosis Date Noted  . Sleep apnea 01/09/2017  . Morbid obesity with BMI of 45.0-49.9, adult ([place]) 01/09/2017  . Healthcare maintenance 10/18/2016  . Type 2 diabetes mellitus without complications ([place]) 73/53/2992  . LOW BACK PAIN, ACUTE 11/20/2006  . POLYCYSTIC OVARY 07/04/2006  . OBESITY, NOS 07/04/2006  . DEPRESSIVE DISORDER, NOS 07/04/2006  . TOBACCO USE, QUIT 07/04/2006     Past Medical History:  Diagnosis Date  . Abnormal uterine bleeding   . Amenorrhea   . Anemia   . Anxiety    panic attacks  . Diabetes mellitus without complication ([place])    type 2  . Dysmenorrhea   . GERD (gastroesophageal reflux disease)    occasional - diet controlled and tums prn  . Hyperlipidemia    no meds -diet controlled  . Infertility, female   . Insulin resistance   . Migraine without aura   . Missed abortion    x 2 - both resolved without surgery  . Obesity   . PCOS (polycystic ovarian syndrome)   . PCOS (polycystic ovarian syndrome)   . PONV  (postoperative nausea and vomiting)   . Sleep apnea   . SVD (spontaneous vaginal delivery)    x 1  . Urinary incontinence      Past Surgical History:  Procedure Laterality Date  . CHOLECYSTECTOMY    . COLONOSCOPY  2017   polyps  . CYSTOSCOPY N/A 10/29/2016   Procedure: CYSTOSCOPY;  Surgeon: Salvadore Dom, MD;  Location: [facility];  Service: Gynecology;  Laterality: N/A;  . DILATATION & CURRETTAGE/HYSTEROSCOPY WITH RESECTOCOPE N/A 10/17/2012   Procedure: DILATATION & CURETTAGE/HYSTEROSCOPY WITH RESECTOCOPE; Possible Polypectomy, Possible Resectoscopic Myomectomy.;  Surgeon: Floyce Stakes. Pamala Hurry, MD;  Location: [facility];  Service: Gynecology;  Laterality: N/A;  1 hr.  Marland Kitchen DILATION AND CURETTAGE OF UTERUS    . ENDOMETRIAL BIOPSY    . ESOPHAGOGASTRODUODENOSCOPY ENDOSCOPY  2017   Normal  . HYSTEROSCOPY W/D&C N/A 12/01/2013   Procedure: IUD Removal;  Surgeon: Claiborne Billings A. Pamala Hurry, MD;  Location: [facility];  Service: Gynecology;  Laterality: N/A;  90 min.  . INTRAUTERINE DEVICE (IUD) INSERTION N/A 10/17/2012   Procedure: INTRAUTERINE DEVICE (IUD) INSERTION; Mirena;  Surgeon: Floyce Stakes. Pamala Hurry, MD;  Location: [facility];  Service: Gynecology;  Laterality: N/A;  . LAPAROSCOPIC SALPINGO OOPHERECTOMY Left 10/29/2016   Procedure: LAPAROSCOPIC SALPINGO OOPHORECTOMY With anterior uterine biopsy;  Surgeon: Salvadore Dom, MD;  Location: [facility];  Service: Gynecology;  Laterality: Left;  .  MOUTH SURGERY     wisdom teeth     Family History  Problem Relation Age of Onset  . Diabetes Father   . Thyroid disease Father   . Hypertension Father   . Diabetes Maternal Grandmother   . Heart disease Maternal Grandmother   . Diabetes Maternal Grandfather   . Heart disease Maternal Grandfather   . Stroke Maternal Grandfather   . Diabetes Paternal Grandmother   . Diabetes Paternal Grandfather   . Heart disease Paternal Grandfather   . Alcohol abuse Paternal Grandfather   . Diabetes Other   . Obesity Other   .  Sleep apnea Other   . Hyperlipidemia Other   . Hypertension Other   . Cancer Other   . Heart disease Other   . Hypertension Mother      History  Drug Use No     History  Alcohol Use  . Yes    Comment: occasional     History  Smoking Status  . Former Smoker  . Packs/day: 0.25  . Years: 19.00  . Types: Cigarettes  . Quit date: 06/06/2016  Smokeless Tobacco  . Never Used     Outpatient Encounter Prescriptions as of 01/09/2017  Medication Sig  . blood glucose meter kit and supplies KIT Dispense based on patient and insurance preference. Use up to four times daily as directed. (FOR ICD-9 250.00, 250.01).  . calcium carbonate (TUMS - DOSED IN MG ELEMENTAL CALCIUM) 500 MG chewable tablet Chew 3 tablets by mouth daily as needed for indigestion or heartburn.  . Cholecalciferol (D3-50) 50000 units capsule TAKE ONE CAPSULE BY MOUTH ONCE A WEEK FOR LOW VITAMIN D  . citalopram (CELEXA) 10 MG tablet Take 1 tablet po a day  . diazepam (VALIUM) 5 MG tablet Take 1 tablet (5 mg total) by mouth every 8 (eight) hours as needed for anxiety.  Marland Kitchen ibuprofen (ADVIL,MOTRIN) 800 MG tablet Take 1 tablet (800 mg total) by mouth every 8 (eight) hours as needed.  Marland Kitchen levonorgestrel (MIRENA) 20 MCG/24HR IUD 1 each by Intrauterine route once.  . metFORMIN (GLUCOPHAGE) 500 MG tablet Take 1 tablet (500 mg total) by mouth 2 (two) times daily with a meal. Start by just taking 1 tablet with dinner for the first two weeks, then twice daily.  Ernestine Conrad Fatty Acids-Vitamins (OMEGA-3 GUMMIES) CHEW Chew by mouth.  . Probiotic Product (DIGESTIVE ADVANTAGE GUMMIES) CHEW Chew by mouth.  . promethazine (PHENERGAN) 25 MG tablet Take 1 tablet (25 mg total) by mouth every 6 (six) hours as needed for nausea or vomiting.  . [DISCONTINUED] citalopram (CELEXA) 20 MG tablet Take 1 tablet po a day  . [DISCONTINUED] dicyclomine (BENTYL) 20 MG tablet Take 1 tablet (20 mg total) by mouth 2 (two) times daily.  . [DISCONTINUED]  promethazine (PHENERGAN) 25 MG tablet Take 1 tablet (25 mg total) by mouth every 6 (six) hours as needed for nausea or vomiting.  . [DISCONTINUED] sulfamethoxazole-trimethoprim (BACTRIM DS) 800-160 MG tablet Take 1 tablet by mouth 2 (two) times daily. One PO BID x 7 days  . SUMAtriptan (IMITREX) 100 MG tablet Take 1 tablet (100 mg total) by mouth once. May repeat in 2 hours if headache persists or recurs.  Take a maximum of 2 tablets per 24 hour period.   No facility-administered encounter medications on file as of 01/09/2017.     Allergies: Iohexol  Body mass index is 45.01 kg/m.  Blood pressure 124/77, pulse 80, height '5\' 8"'$  (1.727 m), weight 296 lb (  134.3 kg).   Review of Systems  Constitutional: Positive for fatigue. Negative for activity change, appetite change, chills, diaphoresis, fever and unexpected weight change.  Respiratory: Negative for cough, chest tightness, shortness of breath, wheezing and stridor.   Cardiovascular: Negative for chest pain, palpitations and leg swelling.  Gastrointestinal: Negative for abdominal distention, abdominal pain, blood in stool, constipation, diarrhea, nausea and vomiting.  Endocrine: Negative for cold intolerance, heat intolerance, polydipsia, polyphagia and polyuria.  Genitourinary: Negative for difficulty urinating and flank pain.  Musculoskeletal: Negative for arthralgias, back pain, gait problem, joint swelling, myalgias, neck pain and neck stiffness.  Skin: Negative for color change, pallor, rash and wound.  Neurological: Negative for dizziness and headaches.  Psychiatric/Behavioral: Negative for decreased concentration, dysphoric mood and sleep disturbance. The patient is not nervous/anxious.        Objective:   Physical Exam  Constitutional: She is oriented to person, place, and time. She appears well-developed and well-nourished. No distress.  HENT:  Head: Normocephalic and atraumatic.  Right Ear: External ear normal.  Left Ear:  External ear normal.  Eyes: Conjunctivae are normal.  Neck: Normal range of motion. Neck supple.  Cardiovascular: Normal rate, regular rhythm, normal heart sounds and intact distal pulses.   No murmur heard. Pulmonary/Chest: Effort normal and breath sounds normal. No respiratory distress. She has no wheezes. She has no rales. She exhibits no tenderness.  Lymphadenopathy:    She has no cervical adenopathy.  Neurological: She is alert and oriented to person, place, and time. Coordination normal.  Skin: Skin is warm and dry. No rash noted. She is not diaphoretic. No erythema. No pallor.  Psychiatric: She has a normal mood and affect. Her behavior is normal. Judgment and thought content normal.  Nursing note and vitals reviewed.         Assessment & Plan:   1. Type 2 diabetes mellitus without complication, without long-term current use of insulin ([place])   2. Obstructive sleep apnea syndrome   3. Morbid obesity with BMI of 45.0-49.9, adult ([place])     Sleep apnea Sx's with OSA 11/2016, she will start nightly CPAP this week.  Type 2 diabetes mellitus without complications ([facility]) T5T 11/06/20- 7.2 A1c today 6.4-GREAT! She has lost 6 lbs and has been exercising vigorously 4days/week. Continue Metformin '500mg'$  BID and diabetic diet. F/u in 3 months.  Morbid obesity with BMI of 45.0-49.9, adult ([place]) Has lost 6 lbs since last OV  DEPRESSIVE DISORDER, NOS She feels that the '20mg'$  dose "made me too happy, like I didn't have any emotion". Dosage reduced from '20mg'$  to '10mg'$  daily.    FOLLOW-UP:  Return in about 3 months (around 04/10/2017) for CPE, Fasting Lab Draw.

## 2017-01-09 NOTE — Assessment & Plan Note (Signed)
Sx's with OSA 11/2016, she will start nightly CPAP this week.

## 2017-01-09 NOTE — Patient Instructions (Signed)

## 2017-01-09 NOTE — Assessment & Plan Note (Signed)
Has lost 6 lbs since last OV

## 2017-01-09 NOTE — Assessment & Plan Note (Signed)
She feels that the 20mg  dose "made me too happy, like I didn't have any emotion". Dosage reduced from 20mg  to 10mg  daily.

## 2017-01-09 NOTE — Assessment & Plan Note (Addendum)
A1c 10/10/16- 7.2 A1c today 6.4-GREAT! She has lost 6 lbs and has been exercising vigorously 4days/week. Continue Metformin 500mg  BID and diabetic diet. F/u in 3 months.

## 2017-01-10 ENCOUNTER — Ambulatory Visit: Payer: Self-pay | Admitting: Adult Health

## 2017-01-14 ENCOUNTER — Encounter: Payer: 59 | Attending: Surgery | Admitting: Skilled Nursing Facility1

## 2017-01-14 DIAGNOSIS — G4733 Obstructive sleep apnea (adult) (pediatric): Secondary | ICD-10-CM | POA: Diagnosis not present

## 2017-01-14 DIAGNOSIS — E7800 Pure hypercholesterolemia, unspecified: Secondary | ICD-10-CM | POA: Insufficient documentation

## 2017-01-14 DIAGNOSIS — Z713 Dietary counseling and surveillance: Secondary | ICD-10-CM | POA: Insufficient documentation

## 2017-01-14 DIAGNOSIS — M549 Dorsalgia, unspecified: Secondary | ICD-10-CM | POA: Diagnosis not present

## 2017-01-14 DIAGNOSIS — F329 Major depressive disorder, single episode, unspecified: Secondary | ICD-10-CM | POA: Diagnosis not present

## 2017-01-14 DIAGNOSIS — Z79899 Other long term (current) drug therapy: Secondary | ICD-10-CM | POA: Diagnosis not present

## 2017-01-14 DIAGNOSIS — F419 Anxiety disorder, unspecified: Secondary | ICD-10-CM | POA: Diagnosis not present

## 2017-01-14 DIAGNOSIS — K219 Gastro-esophageal reflux disease without esophagitis: Secondary | ICD-10-CM | POA: Diagnosis not present

## 2017-01-14 DIAGNOSIS — E119 Type 2 diabetes mellitus without complications: Secondary | ICD-10-CM | POA: Insufficient documentation

## 2017-01-15 ENCOUNTER — Encounter: Payer: Self-pay | Admitting: Skilled Nursing Facility1

## 2017-01-15 NOTE — Progress Notes (Signed)
Pre-Operative Nutrition Class:  Appt start time: 5956   End time:  1830.  Patient was seen on 01/14/2017 for Pre-Operative Bariatric Surgery [facility].   Surgery date:  Surgery type: Sleeve Start weight at [facility]: 296.9 Weight today: 297.6  Samples given per MNT protocol. Patient educated on appropriate usage: Bariatric Advantage Multivitamin Lot # [id number] Exp:06/19  Bariatric Advantage Calcium  Lot # [id number] Exp: sep-26-2018  [place] Protein  Shake Lot # [id number] Exp: Sep 30, 2017 The following the learning objectives were met by the patient during this course:  Identify Pre-Op Dietary Goals and will begin 2 weeks pre-operatively  Identify appropriate sources of fluids and proteins   State protein recommendations and appropriate sources pre and post-operatively  Identify Post-Operative Dietary Goals and will follow for 2 weeks post-operatively  Identify appropriate multivitamin and calcium sources  Describe the need for physical activity post-operatively and will follow MD recommendations  State when to call healthcare provider regarding medication questions or post-operative complications  Handouts given during class include:  Pre-Op Bariatric Surgery Diet Handout  Protein Shake Handout  Post-Op Bariatric Surgery Nutrition Handout  [facility] Program Information Flyer  Support Group Information Flyer  [facility] Bariatric Supplements Price List  [facility]: [facility] advancement per MD.

## 2017-01-25 MED FILL — IBUPROFEN 600 MG TABLET: 600 | 5 days supply | Qty: 20 | Fill #0

## 2017-01-25 MED FILL — HYDROCODON-APAP 5-325: 5-325 | 3 days supply | Qty: 20 | Fill #0

## 2017-01-25 MED FILL — VITAMIN D3 50000 UNIT CAPS: 1.25 MG | 84 days supply | Qty: 12 | Fill #2

## 2017-01-25 MED FILL — metFORMIN HCL 500 MG TABS: 500 | 90 days supply | Qty: 180 | Fill #1

## 2017-01-30 MED FILL — AMOXICILLIN 500 MG CAPSULE: 500 | 10 days supply | Qty: 30 | Fill #0

## 2017-01-30 MED FILL — IBUPROFEN 800 MG TAB: 800 | 5 days supply | Qty: 20 | Fill #0

## 2017-02-07 MED FILL — PANTOPRAZOLE SOD DR 40 MG T: 40 | 30 days supply | Qty: 30 | Fill #0

## 2017-02-07 MED FILL — ONDANSETRON ODT 4 MG TABLET: 4 | 3 days supply | Qty: 15 | Fill #0

## 2017-02-08 ENCOUNTER — Other Ambulatory Visit: Payer: Self-pay

## 2017-02-08 NOTE — Patient Outreach (Signed)
[facility]) Care Management  02/08/2017  [place] [birth year] [id number]   Telephone call for screening for scheduled upcoming surgery. No answer and message left.   Plan to attempt outreach on 02/11/17.  Peter Garter RN, [facility] Care Management Coordinator-Link to [place] Management [phone]

## 2017-02-11 ENCOUNTER — Other Ambulatory Visit: Payer: Self-pay

## 2017-02-11 MED FILL — PROMETHAZINE 25 MG TABLET: 25 | 7 days supply | Qty: 30 | Fill #0

## 2017-02-11 MED FILL — oxyCODONE HCL 5 MG/5ML SOLN: 5 | 3 days supply | Qty: 200 | Fill #0

## 2017-02-11 NOTE — Patient Outreach (Signed)
[facility] [place]  [facility]) Care Management  02/11/2017  [place] 1980-10-31 [id number]    Telephone call for screening for scheduled upcoming surgery.  Member returned phone message. Member is scheduled for gastric sleeve surgery on 02/19/17  States that she has been through the education that is required for the surgery.  States she has been following the pre-surgical diet.  States she has turned in her Family Medical Leave forms but she has not applied for her short term disability yet.  States she does not have the hospital indemnity insurance.  States she is in the [place] program for her diabetes and she has been losing weight on her pre-surgical diet.  States her husband is very supportive and he will be available to assist her post surgery.  States her sister is also coming to stay with her during and after her surgery.  Assessment:  Scheduled for gastric sleeve surgery on 02/19/17.  Pre-operative screening call completed to reviewed to review benefits and to assess for any needs.  Member has competed her [employer] Leave forms.  She has not applied for short term disability yet.  Member does not have the hospital indemnity insurance.  Member has a supportive family to help with her care and transportation after her surgery.  Member is being followed in the [facility] program for her Type 2 DM.  Instructed on [facility] benefits available to her and encouraged to apply for her short term disability within the next day.  Given contact number for [employer] to apply for her short term disability.  Instructed to follow post surgical diet and to try to avoid becoming dehydrated.   Instructed to continue with the [facility] program. Instructed that [facility] will contact her post discharge for a transition of care call. Given contact information for RNCM if needed.  Member assessed with no further case management needs. Plan to send successful outreach letter and plan to call post discharge for  transition of care call. Plan to continue to monitor in the [facility] program  Peter Garter RN, [facility] Care Management Coordinator-Link to [place] Management [phone]

## 2017-02-11 NOTE — Patient Outreach (Signed)
[facility]) Care Management  02/11/2017  [place] [birth year] [id number]    Telephone call for screening for scheduled upcoming surgery. No answer and message left.   Plan to attempt outreach on 02/12/17 if call not returned  Peter Garter RN, [facility] Care Management Coordinator-Link to [place] Management [phone]

## 2017-02-12 ENCOUNTER — Ambulatory Visit: Payer: Self-pay

## 2017-02-12 NOTE — Progress Notes (Signed)
Please place orders in EPIC as patient is being scheduled for a pre-op appointment! Thank you! 

## 2017-02-13 DIAGNOSIS — G4733 Obstructive sleep apnea (adult) (pediatric): Secondary | ICD-10-CM | POA: Diagnosis not present

## 2017-02-13 NOTE — Progress Notes (Signed)
Please place orders in EPIC as patient has a pre-op appointment on 02/18/2017! Thank you!

## 2017-02-14 NOTE — Progress Notes (Signed)
EKG 11-15-16 epic   HgA1C 01-09-17 epic  CXR 11-15-16 epic

## 2017-02-14 NOTE — Progress Notes (Signed)
Please place orders in EPIC as patient has a pre-op appointment on 02/18/2017! Thank you!

## 2017-02-14 NOTE — Patient Instructions (Addendum)
[name] [name] [facility]  02/14/2017   Your procedure is scheduled on: 02-19-17  Report to [facility] Main  Entrance Take [place]  elevators to 3rd floor to  [place] at [phone].   Call this number if you have problems the morning of surgery [phone]    Remember: ONLY 1 PERSON MAY GO WITH YOU TO SHORT STAY TO GET  READY MORNING OF YOUR SURGERY.  Do not eat food or drink liquids :After Midnight.    PLEASE BRING CPAP MASK AND TUBING TO [place]. DEVICE WILL BE PROVIDED FOR YOU!    Take these medicines the morning of surgery with A SIP OF WATER: valium if needed                                You may not have any metal on your body including hair pins and              piercings  Do not wear jewelry, make-up, lotions, powders or perfumes, deodorant             Do not wear nail polish.  Do not shave  48 hours prior to surgery.           Do not bring valuables to the hospital. [place].  Contacts, dentures or bridgework may not be worn into surgery.  Leave suitcase in the car. After surgery it may be brought to your room.                Please read over the following fact sheets you were given: _____________________________________________________________________            How to Manage Your Diabetes Before and After Surgery  Why is it important to control my blood sugar before and after surgery? . Improving blood sugar levels before and after surgery helps healing and can limit problems. . A way of improving blood sugar control is eating a healthy diet by: o  Eating less sugar and carbohydrates o  Increasing activity/exercise o  Talking with your doctor about reaching your blood sugar goals . High blood sugars (greater than 180 mg/dL) can raise your risk of infections and slow your recovery, so you will need to focus on controlling your diabetes during the weeks before surgery. . Make sure that the  doctor who takes care of your diabetes knows about your planned surgery including the date and location.  How do I manage my blood sugar before surgery? . Check your blood sugar at least 4 times a day, starting 2 days before surgery, to make sure that the level is not too high or low. o Check your blood sugar the morning of your surgery when you wake up and every 2 hours until you get to the Short Stay unit. . If your blood sugar is less than 70 mg/dL, you will need to treat for low blood sugar: o Do not take insulin. o Treat a low blood sugar (less than 70 mg/dL) with  cup of clear juice (cranberry or apple), 4 glucose tablets, OR glucose gel. o Recheck blood sugar in 15 minutes after treatment (to make sure it is greater than 70 mg/dL). If your blood sugar is not greater than 70  mg/dL on recheck, call [phone] for further instructions. . Report your blood sugar to the short stay nurse when you get to Short Stay.  . If you are admitted to the hospital after surgery: o Your blood sugar will be checked by the staff and you will probably be given insulin after surgery (instead of oral diabetes medicines) to make sure you have good blood sugar levels. o The goal for blood sugar control after surgery is 80-180 mg/dL.   WHAT DO I DO ABOUT MY DIABETES MEDICATION?   . THE DAY BEFORE SURGERY, take  METFORMIN as normal       . THE MORNING OF SURGERY, DO NOT TAKE ANY DIABETIC MEDICATIONS !!   Patient Signature:  Date:   Nurse Signature:  Date:   Reviewed and Endorsed by [facility] Patient Education Committee, August 2015   [facility] - Preparing for Surgery Before surgery, you can play an important role.  Because skin is not sterile, your skin needs to be as free of germs as possible.  You can reduce the number of germs on your skin by washing with CHG (chlorahexidine gluconate) soap before surgery.  CHG is an antiseptic cleaner which kills germs and bonds with the skin to continue  killing germs even after washing. Please DO NOT use if you have an allergy to CHG or antibacterial soaps.  If your skin becomes reddened/irritated stop using the CHG and inform your nurse when you arrive at Short Stay. Do not shave (including legs and underarms) for at least 48 hours prior to the first CHG shower.  You may shave your face/neck. Please follow these instructions carefully:  1.  Shower with CHG Soap the night before surgery and the  morning of Surgery.  2.  If you choose to wash your hair, wash your hair first as usual with your  normal  shampoo.  3.  After you shampoo, rinse your hair and body thoroughly to remove the  shampoo.                           4.  Use CHG as you would any other liquid soap.  You can apply chg directly  to the skin and wash                       Gently with a scrungie or clean washcloth.  5.  Apply the CHG Soap to your body ONLY FROM THE NECK DOWN.   Do not use on face/ open                           Wound or open sores. Avoid contact with eyes, ears mouth and genitals (private parts).                       Wash face,  Genitals (private parts) with your normal soap.             6.  Wash thoroughly, paying special attention to the area where your surgery  will be performed.  7.  Thoroughly rinse your body with warm water from the neck down.  8.  DO NOT shower/wash with your normal soap after using and rinsing off  the CHG Soap.                9.  Pat yourself dry with a clean towel.  10.  Wear clean pajamas.            11.  Place clean sheets on your bed the night of your first shower and do not  sleep with pets. Day of Surgery : Do not apply any lotions/deodorants the morning of surgery.  Please wear clean clothes to the hospital/surgery center.  FAILURE TO FOLLOW THESE INSTRUCTIONS MAY RESULT IN THE CANCELLATION OF YOUR SURGERY PATIENT SIGNATURE_________________________________  NURSE  SIGNATURE__________________________________  ________________________________________________________________________

## 2017-02-15 NOTE — Progress Notes (Signed)
Please place orders in EPIC as patient has a pre-op appointment on 02/18/2017! Thank you!

## 2017-02-18 ENCOUNTER — Encounter (HOSPITAL_COMMUNITY): Payer: Self-pay

## 2017-02-18 ENCOUNTER — Encounter (HOSPITAL_COMMUNITY)
Admission: RE | Admit: 2017-02-18 | Discharge: 2017-02-18 | Disposition: A | Discharge status: Home / Self Care | Payer: 59 | Source: Ambulatory Visit | Attending: Surgery | Admitting: Surgery

## 2017-02-18 ENCOUNTER — Ambulatory Visit: Payer: Self-pay | Admitting: Surgery

## 2017-02-18 ENCOUNTER — Ambulatory Visit (HOSPITAL_COMMUNITY): Admit: 2017-02-18 | Payer: No Typology Code available for payment source | Admitting: Obstetrics and Gynecology

## 2017-02-18 DIAGNOSIS — Z79899 Other long term (current) drug therapy: Secondary | ICD-10-CM | POA: Diagnosis not present

## 2017-02-18 DIAGNOSIS — Z91041 Radiographic dye allergy status: Secondary | ICD-10-CM | POA: Diagnosis not present

## 2017-02-18 DIAGNOSIS — G4730 Sleep apnea, unspecified: Secondary | ICD-10-CM | POA: Diagnosis not present

## 2017-02-18 DIAGNOSIS — Z6841 Body Mass Index (BMI) 40.0 and over, adult: Secondary | ICD-10-CM | POA: Diagnosis not present

## 2017-02-18 DIAGNOSIS — K219 Gastro-esophageal reflux disease without esophagitis: Secondary | ICD-10-CM | POA: Diagnosis not present

## 2017-02-18 DIAGNOSIS — K449 Diaphragmatic hernia without obstruction or gangrene: Secondary | ICD-10-CM | POA: Diagnosis not present

## 2017-02-18 LAB — GLUCOSE, CAPILLARY: GLUCOSE-CAPILLARY: 101 mg/dL — AB (ref 65–99)

## 2017-02-18 LAB — CBC
HCT: 38.2 % (ref 36.0–46.0)
Hemoglobin: 12.6 g/dL (ref 12.0–15.0)
MCH: 27.6 pg (ref 26.0–34.0)
MCHC: 33 g/dL (ref 30.0–36.0)
MCV: 83.8 fL (ref 78.0–100.0)
PLATELETS: 253 10*3/uL (ref 150–400)
RBC: 4.56 MIL/uL (ref 3.87–5.11)
RDW: 14.1 % (ref 11.5–15.5)
WBC: 9.8 10*3/uL (ref 4.0–10.5)

## 2017-02-18 LAB — BASIC METABOLIC PANEL
Anion gap: 9 (ref 5–15)
BUN: 18 mg/dL (ref 6–20)
CO2: 26 mmol/L (ref 22–32)
CREATININE: 0.91 mg/dL (ref 0.44–1.00)
Calcium: 9.3 mg/dL (ref 8.9–10.3)
Chloride: 102 mmol/L (ref 101–111)
GFR calc Af Amer: >60 mL/min (ref >60)
GFR calc non Af Amer: >60 mL/min (ref >60)
Glucose, Bld: 97 mg/dL (ref 65–99)
Potassium: 3.9 mmol/L (ref 3.5–5.1)
SODIUM: 137 mmol/L (ref 135–145)

## 2017-02-18 LAB — HCG, SERUM, QUALITATIVE: PREG SERUM: POSITIVE — AB

## 2017-02-18 SURGERY — LAPAROSCOPY OPERATIVE
Anesthesia: General

## 2017-02-18 NOTE — Anesthesia Preprocedure Evaluation (Addendum)
Anesthesia Evaluation  Patient identified by MRN, date of birth, ID band Patient awake    Reviewed: Allergy & Precautions, NPO status , Patient's Chart, lab work & pertinent test results  History of Anesthesia Complications (+) PONV and history of anesthetic complications  Airway Mallampati: I  TM Distance: >3 FB Neck ROM: Full    Dental  (+) Dental Advisory Given   Pulmonary former smoker,    breath sounds clear to auscultation       Cardiovascular (-) anginanegative cardio ROS   Rhythm:Regular Rate:Normal     Neuro/Psych  Headaches, PSYCHIATRIC DISORDERS Anxiety Depression    GI/Hepatic Neg liver ROS, GERD  Poorly Controlled,  Endo/Other  diabetes, Oral Hypoglycemic AgentsMorbid obesityPolycystic ovarian  Renal/GU negative Renal ROS     Musculoskeletal   Abdominal (+) + obese,   Peds  Hematology negative hematology ROS (+)   Anesthesia Other Findings   Reproductive/Obstetrics                            Anesthesia Physical  Anesthesia Plan  ASA: III  Anesthesia Plan: General   Post-op Pain Management:    Induction: Intravenous  PONV Risk Score and Plan: 4 or greater and Ondansetron, Dexamethasone, Propofol, Midazolam and Scopolamine patch - Pre-op  Airway Management Planned: Oral ETT  Additional Equipment:   Intra-op Plan:   Post-operative Plan: Extubation in OR  Informed Consent: I have reviewed the patients History and Physical, chart, labs and discussed the procedure including the risks, benefits and alternatives for the proposed anesthesia with the patient or authorized representative who has indicated his/her understanding and acceptance.   Dental advisory given  Plan Discussed with: CRNA and Surgeon  Anesthesia Plan Comments: (Plan routine monitors, GETA)       Anesthesia Quick Evaluation

## 2017-02-18 NOTE — Progress Notes (Signed)
Called [facility] triage desk. Requested orders for surgery on 02/20/16,

## 2017-02-18 NOTE — Progress Notes (Signed)
EKG performed 11-15-16 indicates new RBBB. RN contacted Dr Fransisco Beau with anesthesia for consult. Informed patient reports no current cardiac symptoms . Per Fransisco Beau , patient okay to proceed with surgery, no need to repeat EKG. RN relayed this to patient . Patient agreeable to anesthesia recommendation. RN also encouraged patient to F/U with PCP or cardio post-operatively

## 2017-02-18 NOTE — H&P (Signed)
Jasminne Mealy [facility]  02/07/2017 5:03 PM  Location: [place] Office  Patient #: [id number]  DOB: 31-Mar-1981  Married / Language: Cleophus Molt / Race: White  Female      History of Present Illness Rodman Key B. Hassell Done MD; 02/07/2017 5:14 PM)  The patient is a 36 year old female who presents for a bariatric surgery evaluation. preoperative visit. Tiny hiatal hernia noted on upper GI. She is excited and is currently on the preoperative diet. I answered her questions regarding the surgery. We plan a sleeve gastrectomy on October 16. We gave her her preoperative medications. 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      Allergies Mammie Lorenzo, LPN; 20/12/221 3:61 PM)  Iodinated Diagnostic Agents  has to do prep prior to having    Medication History Mammie Lorenzo, LPN; 22/08/4973 3:00 PM)  SUMAtriptan Succinate (100MG  Tablet, Oral) Active.    Vitals Claiborne Billings Dockery LPN; 51/05/209 1:73 PM)  02/07/2017 5:03 PM  Weight: 292.8 lb Height: 67.75in  Body Surface Area: 2.4 m Body Mass Index: 44.85 kg/m   Temp.: 98.52F(Oral)  Pulse: 58 (Regular)   BP: 118/70 (Sitting, Left Arm, Standard)              Physical Exam (Konstance Happel B. Hassell Done MD; 02/07/2017 5:15 PM)  The physical exam findings are as follows:  Note:Obese WF NAD  HEENT unremarkable except for glasses  Neck supple without masses  Chest clear  Heart SR without murmurs  Abdomen nontender  Ext FROM        Assessment & Plan Rodman Key B. Hassell Done MD; 02/07/2017 5:16 PM)  MORBID OBESITY, UNSPECIFIED OBESITY TYPE (E66.01)  Story: Two weeks prior to surgery  Go on the extremely low carb liquid diet  One week prior to surgery  No aspirin products. Tylenol is acceptable  Stop smoking  24 hours prior to surgery  No alcoholic beverages  Report fever  greater than 100.5 or excessive nasal drainage suggesting infection  Continue bariatric preop diet  Perform bowel prep if ordered  Do not eat or drink anything after midnight the night before surgery  Do not take any medications except those instructed by the anesthesiologist  Morning of surgery  Please arrive at the hospital at least 2 hours before your scheduled surgery time.  No makeup, fingernail polish or jewelry  Bring insurance cards with you  Bring your CPAP mask if you use this  Impression: Plan sleeve gastrectomy on Oct 16th. She is excited and ready for surgery.  Matt B. Hassell Done, MD, FACS

## 2017-02-19 ENCOUNTER — Inpatient Hospital Stay (HOSPITAL_COMMUNITY)
Admission: RE | Admit: 2017-02-19 | Discharge: 2017-02-21 | DRG: 621 | Disposition: A | Discharge status: Home / Self Care | Payer: 59 | Attending: Surgery | Admitting: Surgery

## 2017-02-19 ENCOUNTER — Encounter (HOSPITAL_COMMUNITY): Payer: Self-pay | Admitting: *Deleted

## 2017-02-19 ENCOUNTER — Inpatient Hospital Stay (HOSPITAL_COMMUNITY): Payer: 59 | Admitting: Anesthesiology

## 2017-02-19 ENCOUNTER — Encounter (HOSPITAL_COMMUNITY)
Admission: RE | Disposition: A | Discharge status: Home / Self Care | Payer: Self-pay | Source: Home / Self Care | Attending: Surgery

## 2017-02-19 DIAGNOSIS — K219 Gastro-esophageal reflux disease without esophagitis: Secondary | ICD-10-CM | POA: Diagnosis not present

## 2017-02-19 DIAGNOSIS — Z9884 Bariatric surgery status: Secondary | ICD-10-CM

## 2017-02-19 DIAGNOSIS — Z6841 Body Mass Index (BMI) 40.0 and over, adult: Secondary | ICD-10-CM | POA: Diagnosis not present

## 2017-02-19 DIAGNOSIS — Z79899 Other long term (current) drug therapy: Secondary | ICD-10-CM | POA: Diagnosis not present

## 2017-02-19 DIAGNOSIS — K449 Diaphragmatic hernia without obstruction or gangrene: Secondary | ICD-10-CM | POA: Diagnosis present

## 2017-02-19 DIAGNOSIS — Z91041 Radiographic dye allergy status: Secondary | ICD-10-CM

## 2017-02-19 DIAGNOSIS — G4730 Sleep apnea, unspecified: Secondary | ICD-10-CM | POA: Diagnosis present

## 2017-02-19 DIAGNOSIS — G4733 Obstructive sleep apnea (adult) (pediatric): Secondary | ICD-10-CM | POA: Diagnosis not present

## 2017-02-19 DIAGNOSIS — E119 Type 2 diabetes mellitus without complications: Secondary | ICD-10-CM | POA: Diagnosis not present

## 2017-02-19 HISTORY — PX: LAPAROSCOPIC GASTRIC SLEEVE RESECTION: SHX5895

## 2017-02-19 LAB — COMPREHENSIVE METABOLIC PANEL
ALBUMIN: 3.9 g/dL (ref 3.5–5.0)
ALT: 27 U/L (ref 14–54)
AST: 18 U/L (ref 15–41)
Alkaline Phosphatase: 65 U/L (ref 38–126)
Anion gap: 11 (ref 5–15)
BUN: 18 mg/dL (ref 6–20)
CHLORIDE: 103 mmol/L (ref 101–111)
CO2: 24 mmol/L (ref 22–32)
Calcium: 9.2 mg/dL (ref 8.9–10.3)
Creatinine, Ser: 0.87 mg/dL (ref 0.44–1.00)
GFR calc Af Amer: >60 mL/min (ref >60)
GFR calc non Af Amer: >60 mL/min (ref >60)
Glucose, Bld: 104 mg/dL — ABNORMAL HIGH (ref 65–99)
POTASSIUM: 4.1 mmol/L (ref 3.5–5.1)
Sodium: 138 mmol/L (ref 135–145)
Total Bilirubin: 0.4 mg/dL (ref 0.3–1.2)
Total Protein: 7.3 g/dL (ref 6.5–8.1)

## 2017-02-19 LAB — CBC
HCT: 40.1 % (ref 36.0–46.0)
Hemoglobin: 13.3 g/dL (ref 12.0–15.0)
MCH: 27.9 pg (ref 26.0–34.0)
MCHC: 33.2 g/dL (ref 30.0–36.0)
MCV: 84.2 fL (ref 78.0–100.0)
PLATELETS: 231 10*3/uL (ref 150–400)
RBC: 4.76 MIL/uL (ref 3.87–5.11)
RDW: 14 % (ref 11.5–15.5)
WBC: 16.7 10*3/uL — ABNORMAL HIGH (ref 4.0–10.5)

## 2017-02-19 LAB — CREATININE, SERUM
Creatinine, Ser: 1.03 mg/dL — ABNORMAL HIGH (ref 0.44–1.00)
GFR calc Af Amer: >60 mL/min (ref >60)
GFR calc non Af Amer: >60 mL/min (ref >60)

## 2017-02-19 LAB — DIFFERENTIAL
BASOS ABS: 0 10*3/uL (ref 0.0–0.1)
BASOS PCT: 0 %
EOS ABS: 0.1 10*3/uL (ref 0.0–0.7)
EOS PCT: 1 %
Lymphocytes Relative: 23 %
Lymphs Abs: 2.2 10*3/uL (ref 0.7–4.0)
MONOS PCT: 7 %
Monocytes Absolute: 0.6 10*3/uL (ref 0.1–1.0)
NEUTROS PCT: 69 %
Neutro Abs: 6.8 10*3/uL (ref 1.7–7.7)

## 2017-02-19 LAB — PREGNANCY, URINE: PREG TEST UR: NEGATIVE

## 2017-02-19 LAB — GLUCOSE, CAPILLARY
GLUCOSE-CAPILLARY: 145 mg/dL — AB (ref 65–99)
Glucose-Capillary: 106 mg/dL — ABNORMAL HIGH (ref 65–99)

## 2017-02-19 LAB — HCG, QUANTITATIVE, PREGNANCY: hCG, Beta Chain, Quant, S: <1 m[IU]/mL (ref <5)

## 2017-02-19 SURGERY — GASTRECTOMY, SLEEVE, LAPAROSCOPIC
Anesthesia: General | Site: Abdomen

## 2017-02-19 MED ORDER — DEXAMETHASONE SODIUM PHOSPHATE 4 MG/ML IJ SOLN
4.0000 mg | INTRAMUSCULAR | Status: AC
  Administered 2017-02-19: 8 mg via INTRAVENOUS

## 2017-02-19 MED ORDER — ROCURONIUM BROMIDE 50 MG/5ML IV SOSY
PREFILLED_SYRINGE | INTRAVENOUS | Status: AC
  Filled 2017-02-19: qty 5

## 2017-02-19 MED ORDER — BUPIVACAINE LIPOSOME 1.3 % IJ SUSP
20.0000 mL | Freq: Once | INTRAMUSCULAR | Status: AC
  Administered 2017-02-19: 20 mL
  Filled 2017-02-19: qty 20

## 2017-02-19 MED ORDER — CEFOTETAN DISODIUM-DEXTROSE 2-2.08 GM-%(50ML) IV SOLR
2.0000 g | INTRAVENOUS | Status: AC
  Administered 2017-02-19: 2 g via INTRAVENOUS
  Filled 2017-02-19: qty 50

## 2017-02-19 MED ORDER — KETAMINE HCL 10 MG/ML IJ SOLN
INTRAMUSCULAR | Status: DC | PRN
  Administered 2017-02-19: 70 mg via INTRAVENOUS

## 2017-02-19 MED ORDER — LIDOCAINE HCL 2 % IJ SOLN
INTRAMUSCULAR | Status: AC
  Filled 2017-02-19: qty 20

## 2017-02-19 MED ORDER — LIDOCAINE HCL 2 % IJ SOLN
INTRAMUSCULAR | Status: AC
Start: 2017-02-19 — End: 2017-02-19
  Filled 2017-02-19: qty 20

## 2017-02-19 MED ORDER — ONDANSETRON HCL 4 MG/2ML IJ SOLN
INTRAMUSCULAR | Status: DC | PRN
  Administered 2017-02-19: 4 mg via INTRAVENOUS

## 2017-02-19 MED ORDER — LIDOCAINE 2% (20 MG/ML) 5 ML SYRINGE
INTRAMUSCULAR | Status: DC | PRN
  Administered 2017-02-19: 1.5 mg/kg/h via INTRAVENOUS

## 2017-02-19 MED ORDER — CELECOXIB 200 MG PO CAPS
400.0000 mg | ORAL_CAPSULE | ORAL | Status: AC
  Administered 2017-02-19: 400 mg via ORAL
  Filled 2017-02-19: qty 2

## 2017-02-19 MED ORDER — KETAMINE HCL-SODIUM CHLORIDE 100-0.9 MG/10ML-% IV SOSY
PREFILLED_SYRINGE | INTRAVENOUS | Status: AC
  Filled 2017-02-19: qty 10

## 2017-02-19 MED ORDER — 0.9 % SODIUM CHLORIDE (POUR BTL) OPTIME
TOPICAL | Status: DC | PRN
  Administered 2017-02-19: 1000 mL

## 2017-02-19 MED ORDER — ONDANSETRON HCL 4 MG/2ML IJ SOLN
4.0000 mg | INTRAMUSCULAR | Status: DC | PRN
  Administered 2017-02-19 – 2017-02-20 (×2): 4 mg via INTRAVENOUS
  Filled 2017-02-19 (×2): qty 2

## 2017-02-19 MED ORDER — CHLORHEXIDINE GLUCONATE CLOTH 2 % EX PADS: 6.0000 | MEDICATED_PAD | Freq: Once | CUTANEOUS | Status: DC

## 2017-02-19 MED ORDER — LACTATED RINGERS IV SOLN
INTRAVENOUS | Status: DC
  Administered 2017-02-19 (×2): via INTRAVENOUS

## 2017-02-19 MED ORDER — KCL IN DEXTROSE-NACL 20-5-0.45 MEQ/L-%-% IV SOLN
INTRAVENOUS | Status: DC
  Administered 2017-02-19 – 2017-02-21 (×3): via INTRAVENOUS
  Filled 2017-02-19 (×6): qty 1000

## 2017-02-19 MED ORDER — LIDOCAINE 2% (20 MG/ML) 5 ML SYRINGE
INTRAMUSCULAR | Status: AC
  Filled 2017-02-19: qty 5

## 2017-02-19 MED ORDER — MIDAZOLAM HCL 2 MG/2ML IJ SOLN
INTRAMUSCULAR | Status: AC
  Filled 2017-02-19: qty 2

## 2017-02-19 MED ORDER — PHENYLEPHRINE 40 MCG/ML (10ML) SYRINGE FOR IV PUSH (FOR BLOOD PRESSURE SUPPORT)
PREFILLED_SYRINGE | INTRAVENOUS | Status: DC | PRN
  Administered 2017-02-19 (×2): 80 ug via INTRAVENOUS

## 2017-02-19 MED ORDER — SUGAMMADEX SODIUM 500 MG/5ML IV SOLN
INTRAVENOUS | Status: DC | PRN
  Administered 2017-02-19: 275 mg via INTRAVENOUS

## 2017-02-19 MED ORDER — HYDRALAZINE HCL 20 MG/ML IJ SOLN: 10.0000 mg | INTRAMUSCULAR | Status: DC | PRN

## 2017-02-19 MED ORDER — ROCURONIUM BROMIDE 10 MG/ML (PF) SYRINGE
PREFILLED_SYRINGE | INTRAVENOUS | Status: DC | PRN
  Administered 2017-02-19: 10 mg via INTRAVENOUS
  Administered 2017-02-19: 20 mg via INTRAVENOUS
  Administered 2017-02-19: 50 mg via INTRAVENOUS

## 2017-02-19 MED ORDER — PANTOPRAZOLE SODIUM 40 MG IV SOLR
40.0000 mg | Freq: Every day | INTRAVENOUS | Status: DC
  Administered 2017-02-19 – 2017-02-20 (×2): 40 mg via INTRAVENOUS
  Filled 2017-02-19 (×2): qty 40

## 2017-02-19 MED ORDER — ACETAMINOPHEN 500 MG PO TABS
1000.0000 mg | ORAL_TABLET | ORAL | Status: AC
  Administered 2017-02-19: 1000 mg via ORAL
  Filled 2017-02-19: qty 2

## 2017-02-19 MED ORDER — MORPHINE SULFATE (PF) 2 MG/ML IV SOLN
1.0000 mg | INTRAVENOUS | Status: DC | PRN
  Administered 2017-02-19: 2 mg via INTRAVENOUS
  Filled 2017-02-19: qty 1

## 2017-02-19 MED ORDER — LACTATED RINGERS IR SOLN
Status: DC | PRN
  Administered 2017-02-19: 1000 mL

## 2017-02-19 MED ORDER — SUGAMMADEX SODIUM 500 MG/5ML IV SOLN
INTRAVENOUS | Status: AC
  Filled 2017-02-19: qty 5

## 2017-02-19 MED ORDER — PROPOFOL 10 MG/ML IV BOLUS
INTRAVENOUS | Status: DC | PRN
  Administered 2017-02-19: 200 mg via INTRAVENOUS

## 2017-02-19 MED ORDER — MEPERIDINE HCL 50 MG/ML IJ SOLN: 6.2500 mg | INTRAMUSCULAR | Status: DC | PRN

## 2017-02-19 MED ORDER — PHENYLEPHRINE 40 MCG/ML (10ML) SYRINGE FOR IV PUSH (FOR BLOOD PRESSURE SUPPORT)
PREFILLED_SYRINGE | INTRAVENOUS | Status: AC
  Filled 2017-02-19: qty 10

## 2017-02-19 MED ORDER — SODIUM CHLORIDE 0.9 % IJ SOLN
INTRAMUSCULAR | Status: AC
  Filled 2017-02-19: qty 10

## 2017-02-19 MED ORDER — LIDOCAINE 2% (20 MG/ML) 5 ML SYRINGE
INTRAMUSCULAR | Status: DC | PRN
  Administered 2017-02-19: 100 mg via INTRAVENOUS

## 2017-02-19 MED ORDER — SCOPOLAMINE 1 MG/3DAYS TD PT72
1.0000 | MEDICATED_PATCH | TRANSDERMAL | Status: DC
  Administered 2017-02-19: 1.5 mg via TRANSDERMAL
  Filled 2017-02-19: qty 1

## 2017-02-19 MED ORDER — GABAPENTIN 250 MG/5ML PO SOLN
200.0000 mg | Freq: Two times a day (BID) | ORAL | Status: DC
  Administered 2017-02-19 – 2017-02-21 (×4): 200 mg via ORAL
  Filled 2017-02-19 (×4): qty 4

## 2017-02-19 MED ORDER — HEPARIN SODIUM (PORCINE) 5000 UNIT/ML IJ SOLN
5000.0000 [IU] | Freq: Three times a day (TID) | INTRAMUSCULAR | Status: DC
  Administered 2017-02-19 – 2017-02-21 (×6): 5000 [IU] via SUBCUTANEOUS
  Filled 2017-02-19 (×6): qty 1

## 2017-02-19 MED ORDER — MIDAZOLAM HCL 5 MG/5ML IJ SOLN
INTRAMUSCULAR | Status: DC | PRN
  Administered 2017-02-19: 2 mg via INTRAVENOUS

## 2017-02-19 MED ORDER — FENTANYL CITRATE (PF) 250 MCG/5ML IJ SOLN
INTRAMUSCULAR | Status: AC
  Filled 2017-02-19: qty 5

## 2017-02-19 MED ORDER — PROPOFOL 10 MG/ML IV BOLUS
INTRAVENOUS | Status: AC
  Filled 2017-02-19: qty 20

## 2017-02-19 MED ORDER — ONDANSETRON HCL 4 MG/2ML IJ SOLN: 4.0000 mg | Freq: Once | INTRAMUSCULAR | Status: DC | PRN

## 2017-02-19 MED ORDER — FENTANYL CITRATE (PF) 100 MCG/2ML IJ SOLN
INTRAMUSCULAR | Status: AC
  Administered 2017-02-19: 25 ug via INTRAVENOUS
  Filled 2017-02-19: qty 2

## 2017-02-19 MED ORDER — ACETAMINOPHEN 160 MG/5ML PO SOLN
650.0000 mg | ORAL | Status: DC | PRN
  Administered 2017-02-20 – 2017-02-21 (×4): 650 mg via ORAL
  Filled 2017-02-19 (×4): qty 20.3

## 2017-02-19 MED ORDER — PREMIER PROTEIN SHAKE
2.0000 [oz_av] | ORAL | Status: DC
  Administered 2017-02-20 – 2017-02-21 (×10): 2 [oz_av] via ORAL

## 2017-02-19 MED ORDER — SODIUM CHLORIDE 0.9 % IJ SOLN
INTRAMUSCULAR | Status: DC | PRN
  Administered 2017-02-19: 10 mL

## 2017-02-19 MED ORDER — ONDANSETRON HCL 4 MG/2ML IJ SOLN
INTRAMUSCULAR | Status: AC
  Filled 2017-02-19: qty 2

## 2017-02-19 MED ORDER — OXYCODONE HCL 5 MG/5ML PO SOLN
5.0000 mg | ORAL | Status: DC | PRN
  Administered 2017-02-19 – 2017-02-20 (×2): 5 mg via ORAL
  Administered 2017-02-20 (×3): 10 mg via ORAL
  Administered 2017-02-21: 5 mg via ORAL
  Filled 2017-02-19 (×3): qty 5
  Filled 2017-02-19: qty 10
  Filled 2017-02-19: qty 5
  Filled 2017-02-19: qty 10
  Filled 2017-02-19: qty 5

## 2017-02-19 MED ORDER — DEXAMETHASONE SODIUM PHOSPHATE 10 MG/ML IJ SOLN
INTRAMUSCULAR | Status: AC
  Filled 2017-02-19: qty 1

## 2017-02-19 MED ORDER — HEPARIN SODIUM (PORCINE) 5000 UNIT/ML IJ SOLN
5000.0000 [IU] | INTRAMUSCULAR | Status: AC
  Administered 2017-02-19: 5000 [IU] via SUBCUTANEOUS
  Filled 2017-02-19: qty 1

## 2017-02-19 MED ORDER — GABAPENTIN 300 MG PO CAPS
300.0000 mg | ORAL_CAPSULE | ORAL | Status: AC
  Administered 2017-02-19: 300 mg via ORAL
  Filled 2017-02-19: qty 1

## 2017-02-19 MED ORDER — APREPITANT 40 MG PO CAPS
40.0000 mg | ORAL_CAPSULE | ORAL | Status: AC
  Administered 2017-02-19: 40 mg via ORAL
  Filled 2017-02-19: qty 1

## 2017-02-19 MED ORDER — FENTANYL CITRATE (PF) 100 MCG/2ML IJ SOLN
INTRAMUSCULAR | Status: DC | PRN
Start: 2017-02-19 — End: 2017-02-19
  Administered 2017-02-19: 50 ug via INTRAVENOUS
  Administered 2017-02-19: 100 ug via INTRAVENOUS
  Administered 2017-02-19 (×2): 50 ug via INTRAVENOUS

## 2017-02-19 MED ORDER — FENTANYL CITRATE (PF) 100 MCG/2ML IJ SOLN
25.0000 ug | INTRAMUSCULAR | Status: DC | PRN
  Administered 2017-02-19: 50 ug via INTRAVENOUS
  Administered 2017-02-19 (×2): 25 ug via INTRAVENOUS

## 2017-02-19 SURGICAL SUPPLY — 60 items
APPLICATOR COTTON TIP 6IN STRL (MISCELLANEOUS) ×2 IMPLANT
APPLIER CLIP 5 13 M/L LIGAMAX5 (MISCELLANEOUS)
APPLIER CLIP ROT 10 11.4 M/L (STAPLE)
APPLIER CLIP ROT 13.4 12 LRG (CLIP)
BAG LAPAROSCOPIC 12 15 PORT 16 (BASKET) ×1 IMPLANT
BAG RETRIEVAL 12/15 (BASKET) ×2
BLADE SURG 15 STRL LF DISP TIS (BLADE) ×1 IMPLANT
BLADE SURG 15 STRL SS (BLADE) ×1
CABLE HIGH FREQUENCY MONO STRZ (ELECTRODE) ×2 IMPLANT
CLIP APPLIE 5 13 M/L LIGAMAX5 (MISCELLANEOUS) IMPLANT
CLIP APPLIE ROT 10 11.4 M/L (STAPLE) IMPLANT
CLIP APPLIE ROT 13.4 12 LRG (CLIP) IMPLANT
DERMABOND ADVANCED (GAUZE/BANDAGES/DRESSINGS) ×1
DERMABOND ADVANCED .7 DNX12 (GAUZE/BANDAGES/DRESSINGS) ×1 IMPLANT
DEVICE PMI PUNCTURE CLOSURE (MISCELLANEOUS) ×2 IMPLANT
DEVICE SUT QUICK LOAD TK 5 (STAPLE) ×2 IMPLANT
DEVICE SUT TI-KNOT TK 5X26 (MISCELLANEOUS) ×2 IMPLANT
DEVICE SUTURE ENDOST 10MM (ENDOMECHANICALS) ×2 IMPLANT
DISSECTOR BLUNT TIP ENDO 5MM (MISCELLANEOUS) ×2 IMPLANT
ELECT REM PT RETURN 15FT ADLT (MISCELLANEOUS) ×2 IMPLANT
GAUZE SPONGE 4X4 12PLY STRL (GAUZE/BANDAGES/DRESSINGS) IMPLANT
GLOVE BIOGEL M 8.0 STRL (GLOVE) ×2 IMPLANT
GOWN STRL REUS W/TWL XL LVL3 (GOWN DISPOSABLE) ×8 IMPLANT
HANDLE STAPLE EGIA 4 XL (STAPLE) ×2 IMPLANT
HOVERMATT SINGLE USE (MISCELLANEOUS) ×2 IMPLANT
KIT BASIN OR (CUSTOM PROCEDURE TRAY) ×2 IMPLANT
MARKER SKIN DUAL TIP RULER LAB (MISCELLANEOUS) ×2 IMPLANT
NEEDLE SPNL 22GX3.5 QUINCKE BK (NEEDLE) ×2 IMPLANT
PACK UNIVERSAL I (CUSTOM PROCEDURE TRAY) ×2 IMPLANT
RELOAD TRI 45 ART MED THCK BLK (STAPLE) ×2 IMPLANT
RELOAD TRI 45 ART MED THCK PUR (STAPLE) ×2 IMPLANT
RELOAD TRI 60 ART MED THCK BLK (STAPLE) ×4 IMPLANT
RELOAD TRI 60 ART MED THCK PUR (STAPLE) ×4 IMPLANT
SCISSORS LAP 5X45 EPIX DISP (ENDOMECHANICALS) IMPLANT
SET IRRIG TUBING LAPAROSCOPIC (IRRIGATION / IRRIGATOR) ×2 IMPLANT
SET TRI-LUMEN FLTR TB AIRSEAL (TUBING) ×2 IMPLANT
SHEARS HARMONIC ACE PLUS 45CM (MISCELLANEOUS) ×2 IMPLANT
SLEEVE ADV FIXATION 5X100MM (TROCAR) ×4 IMPLANT
SLEEVE GASTRECTOMY 36FR VISIGI (MISCELLANEOUS) ×2 IMPLANT
SOLUTION ANTI FOG 6CC (MISCELLANEOUS) ×2 IMPLANT
SPONGE LAP 18X18 X RAY DECT (DISPOSABLE) ×2 IMPLANT
STAPLER VISISTAT 35W (STAPLE) ×2 IMPLANT
SUT MNCRL AB 4-0 PS2 18 (SUTURE) ×2 IMPLANT
SUT SURGIDAC NAB ES-9 0 48 120 (SUTURE) ×2 IMPLANT
SUT VIC AB 4-0 SH 18 (SUTURE) ×2 IMPLANT
SUT VICRYL 0 TIES 12 18 (SUTURE) ×2 IMPLANT
SYR 10ML ECCENTRIC (SYRINGE) ×2 IMPLANT
SYR 20CC LL (SYRINGE) ×2 IMPLANT
SYR 50ML LL SCALE MARK (SYRINGE) ×2 IMPLANT
TOWEL OR 17X26 10 PK STRL BLUE (TOWEL DISPOSABLE) ×4 IMPLANT
TOWEL OR NON WOVEN STRL DISP B (DISPOSABLE) ×2 IMPLANT
TRAY FOLEY W/METER SILVER 16FR (SET/KITS/TRAYS/PACK) IMPLANT
TROCAR ADV FIXATION 5X100MM (TROCAR) ×2 IMPLANT
TROCAR BLADELESS 15MM (ENDOMECHANICALS) ×2 IMPLANT
TROCAR BLADELESS OPT 5 100 (ENDOMECHANICALS) ×2 IMPLANT
TROCAR PORT AIRSEAL 5X120 (TROCAR) ×2 IMPLANT
TUBE CALIBRATION LAPBAND (TUBING) ×2 IMPLANT
TUBING CONNECTING 10 (TUBING) ×2 IMPLANT
TUBING ENDO SMARTCAP (MISCELLANEOUS) ×2 IMPLANT
TUBING INSUF HEATED (TUBING) ×2 IMPLANT

## 2017-02-19 NOTE — Progress Notes (Signed)
Patient post op day progression discussed including ambulation, IS, diet progression, pain and nausea control. No questions at this time.  Complaints of pain related to gas.  Heating pad ordered for patient comfort.  Discussed gas pain decreases with continued ambulation and movement.  Patient states understanding.  Currently sitting in chair.

## 2017-02-19 NOTE — Anesthesia Procedure Notes (Signed)
Procedure Name: Intubation Date/Time: 02/19/2017 10:27 AM Performed by: Noralyn Pick D Pre-anesthesia Checklist: Patient identified, Emergency Drugs available, Suction available and Patient being monitored Patient Re-evaluated:Patient Re-evaluated prior to induction Oxygen Delivery Method: Circle system utilized Preoxygenation: Pre-oxygenation with 100% oxygen Induction Type: IV induction Ventilation: Mask ventilation without difficulty Laryngoscope Size: Mac and 4 Grade View: Grade I Tube type: Oral Tube size: 7.5 mm Number of attempts: 1 Airway Equipment and Method: Stylet Placement Confirmation: ETT inserted through vocal cords under direct vision,  positive ETCO2 and breath sounds checked- equal and bilateral Secured at: 22 cm Tube secured with: Tape Dental Injury: Teeth and Oropharynx as per pre-operative assessment

## 2017-02-19 NOTE — Progress Notes (Signed)
Patient walked in hall without O2 on; returned to room and VS taken. Heart rate ranging between 46 and 55 without O2 on. Reapplied O2 @ 2LPM upon return to room and heart rate came up to 67-69. Dr Hassell Done notified. Donne Hazel, RN

## 2017-02-19 NOTE — Op Note (Signed)
Surgeon: Kaylyn Lim, MD, FACS  Asst:  Gurney Maxin, MD  Anes:  General endotracheal  Procedure: Laparoscopic posterior repair of hiatal hernia;  sleeve gastrectomy and upper endoscopy  Diagnosis: Morbid obesity and GERD  Complications: None noted  EBL:   8 cc  Description of Procedure:  The patient was take to OR 1 and given general anesthesia.  The abdomen was prepped with Technicare and draped sterilely.  A timeout was performed.  Access to the abdomen was achieved with a 5 mm Optiview through the left upper quadant.  Following insufflation, the state of the abdomen was found to be free of adhesions.  Because of her history of reflux and + UGI, we performed the balloon test which was positive with 10 cc air.  The posterior hiatal crura were dissected free and the defect was visualized and closed with a single #1 Surgidek and tyknot.  The balloon test was repeated and was negative for sliding hiatal hernia with 10 cc of air.  The ViSiGi 36Fr tube was inserted to deflate the stomach and was pulled back into the esophagus.    The pylorus was identified and we measured 5 cm back and marked the antrum.  At that point we began dissection to take down the greater curvature of the stomach using the Harmonic scalpel.  This dissection was taken all the way up to the left crus.  Posterior attachments of the stomach were also taken down.    The ViSiGi tube was then passed into the antrum and suction applied so that it was snug along the lessor curvature.  The "crow's foot" or incisura was identified.  The sleeve gastrectomy was begun using the [employer] stapler beginning with a 4.5  Cm black load with TRS followed by a 6 cm black load with pants and then with purple loads.  When the sleeve was complete the tube was taken off suction and insufflated briefly.  The tube was withdrawn.  Upper endoscopy was then performed by Dr. Kieth Brightly.  No bubbles or bleeding were detected.  .     The  specimen was extracted through the 15 trocar site with the bag technique and the 15 mm trocar was closed with a 0 vicryl and the PMI.   Wounds were infiltrated with Exparel and closed with 4-0 Monocryl.    Matt B. Hassell Done, [place], [facility], [place]

## 2017-02-19 NOTE — Interval H&P Note (Signed)
History and Physical Interval Note:  02/19/2017 10:12 AM  [name] [name]  has presented today for surgery, with the diagnosis of Morbid Obesity, DM II, OSA, GERD, PCOS  The various methods of treatment have been discussed with the patient and family. After consideration of risks, benefits and other options for treatment, the patient has consented to  Procedure(s): LAPAROSCOPIC GASTRIC SLEEVE RESECTION, UPPER ENDO (N/A) as a surgical intervention .  The patient's history has been reviewed, patient examined, no change in status, stable for surgery.  I have reviewed the patient's chart and labs.  Questions were answered to the patient's satisfaction.     Lorie Cleckley B

## 2017-02-19 NOTE — Anesthesia Postprocedure Evaluation (Signed)
Anesthesia Post Note  Patient: [name] [name] [facility]  Procedure(s) Performed: LAPAROSCOPIC GASTRIC SLEEVE RESECTION, UPPER ENDOSCOPY (N/A Abdomen)     Patient location during evaluation: PACU Anesthesia Type: General Level of consciousness: awake and alert Pain management: pain level controlled Vital Signs Assessment: post-procedure vital signs reviewed and stable Respiratory status: spontaneous breathing, nonlabored ventilation, respiratory function stable and patient connected to nasal cannula oxygen Cardiovascular status: blood pressure returned to baseline and stable Postop Assessment: no apparent nausea or vomiting Anesthetic complications: no    Last Vitals:  Vitals:   02/19/17 1215 02/19/17 1230  BP: (!) 146/86   Pulse: 78   Resp: 15   Temp: 36.7 C   SpO2: 99% 96%    Last Pain:  Vitals:   02/19/17 1230  TempSrc:   PainSc: 10-Worst pain ever                 Titilayo Hagans

## 2017-02-19 NOTE — Transfer of Care (Signed)
Immediate Anesthesia Transfer of Care Note  Patient: [name] [name] [facility]  Procedure(s) Performed: LAPAROSCOPIC GASTRIC SLEEVE RESECTION, UPPER ENDOSCOPY (N/A Abdomen)  Patient Location: PACU  Anesthesia Type:General  Level of Consciousness: awake, alert  and oriented  Airway & Oxygen Therapy: Patient Spontanous Breathing and Patient connected to face mask oxygen  Post-op Assessment: Report given to RN and Post -op Vital signs reviewed and stable  Post vital signs: Reviewed and stable  Last Vitals:  Vitals:   02/19/17 0739  BP: 120/64  Pulse: 67  Resp: 18  Temp: 36.7 C  SpO2: 99%    Last Pain:  Vitals:   02/19/17 0739  TempSrc: Oral      Patients Stated Pain Goal: 3 (35/68/61 6837)  Complications: No apparent anesthesia complications

## 2017-02-20 ENCOUNTER — Encounter (HOSPITAL_COMMUNITY): Payer: Self-pay | Admitting: Surgery

## 2017-02-20 LAB — CBC WITH DIFFERENTIAL/PLATELET
BASOS ABS: 0 10*3/uL (ref 0.0–0.1)
BASOS PCT: 0 %
EOS PCT: 0 %
Eosinophils Absolute: 0 10*3/uL (ref 0.0–0.7)
HCT: 36.7 % (ref 36.0–46.0)
Hemoglobin: 12.1 g/dL (ref 12.0–15.0)
LYMPHS PCT: 11 %
Lymphs Abs: 1.4 10*3/uL (ref 0.7–4.0)
MCH: 27.6 pg (ref 26.0–34.0)
MCHC: 33 g/dL (ref 30.0–36.0)
MCV: 83.8 fL (ref 78.0–100.0)
MONO ABS: 0.9 10*3/uL (ref 0.1–1.0)
Monocytes Relative: 7 %
Neutro Abs: 10.4 10*3/uL — ABNORMAL HIGH (ref 1.7–7.7)
Neutrophils Relative %: 82 %
PLATELETS: 266 10*3/uL (ref 150–400)
RBC: 4.38 MIL/uL (ref 3.87–5.11)
RDW: 14.3 % (ref 11.5–15.5)
WBC: 12.7 10*3/uL — AB (ref 4.0–10.5)

## 2017-02-20 MED ORDER — SIMETHICONE 80 MG PO CHEW
80.0000 mg | CHEWABLE_TABLET | Freq: Four times a day (QID) | ORAL | Status: DC | PRN
  Administered 2017-02-20: 80 mg via ORAL
  Filled 2017-02-20: qty 1

## 2017-02-20 NOTE — Progress Notes (Signed)
[name], female   DOB: Mar 10, 1981, 36 y.o.   MRN: [id number] [facility] Progress Note:   1 Day Post-Op  Subjective: Mental status is clear.  Feeling much better today.   Objective: Vital signs in last 24 hours: Temp:  [97.5 F (36.4 C)-98.6 F (37 C)] 98.2 F (36.8 C) (10/17 0542) Pulse Rate:  [50-78] 62 (10/17 0210) Resp:  [13-18] 18 (10/17 0542) BP: (99-146)/(50-87) 127/74 (10/17 0542) SpO2:  [96 %-100 %] 100 % (10/17 0542)  Intake/Output from previous day: 10/16 0701 - 10/17 0700 In: 4167.1 [P.O.:360; I.V.:3807.1] Out: 2225 [Urine:2225] Intake/Output this shift: No intake/output data recorded.  Physical Exam: Work of breathing is normal.  Sore at site of retractor.    Lab Results:  Results for orders placed or performed during the hospital encounter of 02/19/17 (from the past 48 hour(s))  Glucose, capillary     Status: Abnormal   Collection Time: 02/19/17  7:39 AM  Result Value Ref Range   Glucose-Capillary 106 (H) 65 - 99 mg/dL   Comment 1 Notify RN   Pregnancy, urine STAT morning of surgery     Status: None   Collection Time: 02/19/17  7:45 AM  Result Value Ref Range   Preg Test, Ur NEGATIVE NEGATIVE    Comment:        THE SENSITIVITY OF THIS METHODOLOGY IS >20 mIU/mL.   Comprehensive metabolic panel     Status: Abnormal   Collection Time: 02/19/17  8:32 AM  Result Value Ref Range   Sodium 138 135 - 145 mmol/L   Potassium 4.1 3.5 - 5.1 mmol/L   Chloride 103 101 - 111 mmol/L   CO2 24 22 - 32 mmol/L   Glucose, Bld 104 (H) 65 - 99 mg/dL   BUN 18 6 - 20 mg/dL   Creatinine, Ser 0.87 0.44 - 1.00 mg/dL   Calcium 9.2 8.9 - 10.3 mg/dL   Total Protein 7.3 6.5 - 8.1 g/dL   Albumin 3.9 3.5 - 5.0 g/dL   AST 18 15 - 41 U/L   ALT 27 14 - 54 U/L   Alkaline Phosphatase 65 38 - 126 U/L   Total Bilirubin 0.4 0.3 - 1.2 mg/dL   GFR calc non Af Amer >60 >60 mL/min   GFR calc Af Amer >60 >60 mL/min    Comment: (NOTE) The eGFR has been  calculated using the CKD EPI equation. This calculation has not been validated in all clinical situations. eGFR's persistently <60 mL/min signify possible Chronic Kidney Disease.    Anion gap 11 5 - 15  hCG, quantitative, pregnancy     Status: None   Collection Time: 02/19/17  8:32 AM  Result Value Ref Range   hCG, Beta Chain, Quant, S <1 <5 mIU/mL    Comment:          GEST. AGE      CONC.  (mIU/mL)   <=1 WEEK        5 - 50     2 WEEKS       50 - 500     3 WEEKS       100 - 10,000     4 WEEKS     1,000 - 30,000     5 WEEKS     3,500 - 115,000   6-8 WEEKS     12,000 - 270,000    12 WEEKS     15,000 - 220,000  FEMALE AND NON-PREGNANT FEMALE:     LESS THAN 5 mIU/mL   Glucose, capillary     Status: Abnormal   Collection Time: 02/19/17 12:15 PM  Result Value Ref Range   Glucose-Capillary 145 (H) 65 - 99 mg/dL  CBC     Status: Abnormal   Collection Time: 02/19/17  3:48 PM  Result Value Ref Range   WBC 16.7 (H) 4.0 - 10.5 K/uL   RBC 4.76 3.87 - 5.11 MIL/uL   Hemoglobin 13.3 12.0 - 15.0 g/dL   HCT 40.1 36.0 - 46.0 %   MCV 84.2 78.0 - 100.0 fL   MCH 27.9 26.0 - 34.0 pg   MCHC 33.2 30.0 - 36.0 g/dL   RDW 14.0 11.5 - 15.5 %   Platelets 231 150 - 400 K/uL  Creatinine, serum     Status: Abnormal   Collection Time: 02/19/17  3:48 PM  Result Value Ref Range   Creatinine, Ser 1.03 (H) 0.44 - 1.00 mg/dL   GFR calc non Af Amer >60 >60 mL/min   GFR calc Af Amer >60 >60 mL/min    Comment: (NOTE) The eGFR has been calculated using the CKD EPI equation. This calculation has not been validated in all clinical situations. eGFR's persistently <60 mL/min signify possible Chronic Kidney Disease.   CBC WITH DIFFERENTIAL     Status: Abnormal   Collection Time: 02/20/17  5:44 AM  Result Value Ref Range   WBC 12.7 (H) 4.0 - 10.5 K/uL   RBC 4.38 3.87 - 5.11 MIL/uL   Hemoglobin 12.1 12.0 - 15.0 g/dL   HCT 36.7 36.0 - 46.0 %   MCV 83.8 78.0 - 100.0 fL   MCH 27.6 26.0 - 34.0 pg   MCHC  33.0 30.0 - 36.0 g/dL   RDW 14.3 11.5 - 15.5 %   Platelets 266 150 - 400 K/uL   Neutrophils Relative % 82 %   Neutro Abs 10.4 (H) 1.7 - 7.7 K/uL   Lymphocytes Relative 11 %   Lymphs Abs 1.4 0.7 - 4.0 K/uL   Monocytes Relative 7 %   Monocytes Absolute 0.9 0.1 - 1.0 K/uL   Eosinophils Relative 0 %   Eosinophils Absolute 0.0 0.0 - 0.7 K/uL   Basophils Relative 0 %   Basophils Absolute 0.0 0.0 - 0.1 K/uL    Radiology/Results: No results found.  Anti-infectives: Anti-infectives    Start     Dose/Rate Route Frequency Ordered Stop   02/19/17 0745  cefoTEtan in Dextrose 5% (CEFOTAN) IVPB 2 g     2 g Intravenous On call to O.R. 02/19/17 0744 02/19/17 1030      Assessment/Plan: Problem List: Patient Active Problem List   Diagnosis Date Noted  . 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 Sleep apnea 01/09/2017  . Morbid obesity with BMI of 45.0-49.9, adult ([place]) 01/09/2017  . Healthcare maintenance 10/18/2016  . Type 2 diabetes mellitus without complications ([place]) 76/22/6333  . LOW BACK PAIN, ACUTE 11/20/2006  . POLYCYSTIC OVARY 07/04/2006  . OBESITY, NOS 07/04/2006  . DEPRESSIVE DISORDER, NOS 07/04/2006  . TOBACCO USE, QUIT 07/04/2006    Doing well and on tract for possible discharge later today.   1 Day Post-Op    LOS: 1 day   Matt B. Hassell Done, MD, [facility] [phone] beeper [phone]  02/20/2017 7:46 AM

## 2017-02-20 NOTE — Progress Notes (Signed)
Patient alert and oriented, Post op day 1.  Provided support and encouragement.  Encouraged pulmonary toilet, ambulation and small sips of liquids. Completed 12 ounces of clear fluid and started protein.  All questions answered.  Will continue to monitor.

## 2017-02-20 NOTE — Plan of Care (Signed)
Problem: Food- and Nutrition-Related Knowledge Deficit (NB-1.1) Goal: Nutrition education Formal process to instruct or train a patient/client in a skill or to impart knowledge to help patients/clients voluntarily manage or modify food choices and eating behavior to maintain or improve health. Outcome: Completed/Met Date Met: 02/20/17 Nutrition Education Note  Received consult for diet education per DROP protocol.   Discussed 2 week post op diet with pt. Emphasized that liquids must be non carbonated, non caffeinated, and sugar free. Fluid goals discussed. Pt to follow up with outpatient bariatric RD for further diet progression after 2 weeks. Multivitamins and minerals also reviewed. Teach back method used, pt expressed understanding, expect good compliance.   Diet: First 2 Weeks  You will see the nutritionist about two (2) weeks after your surgery. The nutritionist will increase the types of foods you can eat if you are handling liquids well:  If you have severe vomiting or nausea and cannot handle clear liquids lasting longer than 1 day, call your surgeon  Protein Shake  Drink at least 2 ounces of shake 5-6 times per day  Each serving of protein shakes (usually 8 - 12 ounces) should have a minimum of:  15 grams of protein  And no more than 5 grams of carbohydrate  Goal for protein each day:  Men = 80 grams per day  Women = 60 grams per day  Protein powder may be added to fluids such as non-fat milk or Lactaid milk or Soy milk (limit to 35 grams added protein powder per serving)   Hydration  Slowly increase the amount of water and other clear liquids as tolerated (See Acceptable Fluids)  Slowly increase the amount of protein shake as tolerated  Sip fluids slowly and throughout the day  May use sugar substitutes in small amounts (no more than 6 - 8 packets per day; i.e. Splenda)   Fluid Goal  The first goal is to drink at least 8 ounces of protein shake/drink per day (or as directed  by the nutritionist); some examples of protein shakes are Premier Protein, [employer], [employer], EAS Edge HP, and Unjury. See handout from pre-op Bariatric Education Class:  Slowly increase the amount of protein shake you drink as tolerated  You may find it easier to slowly sip shakes throughout the day  It is important to get your proteins in first  Your fluid goal is to drink 64 - 100 ounces of fluid daily  It may take a few weeks to build up to this  32 oz (or more) should be clear liquids  And  32 oz (or more) should be full liquids (see below for examples)  Liquids should not contain sugar, caffeine, or carbonation   Clear Liquids:  Water or Sugar-free flavored water (i.e. Fruit H2O, Propel)  Decaffeinated coffee or tea (sugar-free)  Crystal Lite, Wyler's Lite, Minute Maid Lite  Sugar-free Jell-O  Bouillon or broth  Sugar-free Popsicle: *Less than 20 calories each; Limit 1 per day   Full Liquids:  Protein Shakes/Drinks + 2 choices per day of other full liquids  Full liquids must be:  No More Than 12 grams of Carbs per serving  No More Than 3 grams of Fat per serving  Strained low-fat cream soup  Non-Fat milk  Fat-free Lactaid Milk  Sugar-free yogurt (Dannon Lite & Fit, [place] yogurt, Oikos Zero)   Clayton Bibles, MS, RD, LDN [place] [facility] Pager: [phone] After Hours Pager: [phone]

## 2017-02-21 LAB — URINALYSIS, ROUTINE W REFLEX MICROSCOPIC
BILIRUBIN URINE: NEGATIVE
Glucose, UA: NEGATIVE mg/dL
KETONES UR: NEGATIVE mg/dL
NITRITE: NEGATIVE
PH: 5 (ref 5.0–8.0)
Protein, ur: NEGATIVE mg/dL
Specific Gravity, Urine: 1.01 (ref 1.005–1.030)

## 2017-02-21 LAB — CBC WITH DIFFERENTIAL/PLATELET
BASOS ABS: 0 10*3/uL (ref 0.0–0.1)
Basophils Relative: 0 %
EOS ABS: 0.1 10*3/uL (ref 0.0–0.7)
EOS PCT: 1 %
HCT: 34.8 % — ABNORMAL LOW (ref 36.0–46.0)
HEMOGLOBIN: 11.3 g/dL — AB (ref 12.0–15.0)
Lymphocytes Relative: 26 %
Lymphs Abs: 2.4 10*3/uL (ref 0.7–4.0)
MCH: 27.8 pg (ref 26.0–34.0)
MCHC: 32.5 g/dL (ref 30.0–36.0)
MCV: 85.5 fL (ref 78.0–100.0)
Monocytes Absolute: 0.7 10*3/uL (ref 0.1–1.0)
Monocytes Relative: 8 %
NEUTROS PCT: 65 %
Neutro Abs: 6 10*3/uL (ref 1.7–7.7)
PLATELETS: 231 10*3/uL (ref 150–400)
RBC: 4.07 MIL/uL (ref 3.87–5.11)
RDW: 14.5 % (ref 11.5–15.5)
WBC: 9.2 10*3/uL (ref 4.0–10.5)

## 2017-02-21 NOTE — Progress Notes (Signed)
Patient alert and oriented, Post op day 2.  Provided support and encouragement.  Encouraged pulmonary toilet, ambulation and small sips of liquids.  All questions answered.  Will continue to monitor. 

## 2017-02-21 NOTE — Progress Notes (Signed)
Discharge instructions reviewed with patient. Patient expresses understanding of instructions, when to call the MD, and post surgical care.  Will D/C home in private vehicle.

## 2017-02-21 NOTE — Discharge Instructions (Signed)
° ° ° °GASTRIC BYPASS/SLEEVE ° Home Care Instructions ° ° These instructions are to help you care for yourself when you go home. ° °Call: If you have any problems. °• Call [phone] and ask for the surgeon on call °• If you need immediate assistance come to the ER at [facility]. Tell the ER staff you are a new post-op gastric bypass or gastric sleeve patient  °Signs and symptoms to report: • Severe  vomiting or nausea °o If you cannot handle clear liquids for longer than 1 day, call your surgeon °• Abdominal pain which does not get better after taking your pain medication °• Fever greater than 100.4°  F and chills °• Heart rate over 100 beats a minute °• Trouble breathing °• Chest pain °• Redness,  swelling, drainage, or foul odor at incision (surgical) sites °• If your incisions open or pull apart °• Swelling or pain in calf (lower leg) °• Diarrhea (Loose bowel movements that happen often), frequent watery, uncontrolled bowel movements °• Constipation, (no bowel movements for 3 days) if this happens: °o Take Milk of Magnesia, 2 tablespoons by mouth, 3 times a day for 2 days if needed °o Stop taking Milk of Magnesia once you have had a bowel movement °o Call your doctor if constipation continues °Or °o Take Miralax  (instead of Milk of Magnesia) following the label instructions °o Stop taking Miralax once you have had a bowel movement °o Call your doctor if constipation continues °• Anything you think is “abnormal for you” °  °Normal side effects after surgery: • Unable to sleep at night or unable to concentrate °• Irritability °• Being tearful (crying) or depressed ° °These are common complaints, possibly related to your anesthesia, stress of surgery, and change in lifestyle, that usually go away a few weeks after surgery. If these feelings continue, call your medical doctor.  °Wound Care: You may have surgical glue, steri-strips, or staples over your incisions after surgery °• Surgical glue: Looks like clear  film over your incisions and will wear off a little at a time °• Steri-strips: Adhesive strips of tape over your incisions. You may notice a yellowish color on skin under the steri-strips. This is used to make the steri-strips stick better. Do not pull the steri-strips off - let them fall off °• Staples: Staples may be removed before you leave the hospital °o If you go home with staples, call [facility] for an appointment with your surgeon’s nurse to have staples removed 10 days after surgery, [phone] °• Showering: You may shower two (2) days after your surgery unless your surgeon tells you differently °o Wash gently around incisions with warm soapy water, rinse well, and gently pat dry °o If you have a drain (tube from your incision), you may need someone to hold this while you shower °o No tub baths until staples are removed and incisions are healed °  °Medications: • Medications should be liquid or crushed if larger than the size of a dime °• Extended release pills (medication that releases a little bit at a time through the  day) should not be crushed °• Depending on the size and number of medications you take, you may need to space (take a few throughout the day)/change the time you take your medications so that you do not over-fill your pouch (smaller stomach) °• Make sure you follow-up with you primary care physician to make medication changes needed during rapid weight loss and life -style changes °•   If you have diabetes, follow up with your doctor that orders your diabetes medication(s) within one week after surgery and check your blood sugar regularly ° °• Do not drive while taking narcotics (pain medications) ° °• Do not take acetaminophen (Tylenol) and Roxicet or Lortab Elixir at the same time since these pain medications contain acetaminophen °  °Diet:  °First 2 Weeks You will see the nutritionist about two (2) weeks after your surgery. The nutritionist will increase the types of  foods you can eat if you are handling liquids well: °• If you have severe vomiting or nausea and cannot handle clear liquids lasting longer than 1 day call your surgeon °Protein Shake °• Drink at least 2 ounces of shake 5-6 times per day °• Each serving of protein shakes (usually 8-12 ounces) should have a minimum of: °o 15 grams of protein °o And no more than 5 grams of carbohydrate °• Goal for protein each day: °o Men = 80 grams per day °o Women = 60 grams per day °  ° • Protein powder may be added to fluids such as non-fat milk or Lactaid milk or Soy milk (limit to 35 grams added protein powder per serving) ° °Hydration °• Slowly increase the amount of water and other clear liquids as tolerated (See Acceptable Fluids) °• Slowly increase the amount of protein shake as tolerated °• Sip fluids slowly and throughout the day °• May use sugar substitutes in small amounts (no more than 6-8 packets per day; i.e. Splenda) ° °Fluid Goal °• The first goal is to drink at least 8 ounces of protein shake/drink per day (or as directed by the nutritionist); some examples of protein shakes are Syntrax Nectar, Adkins Advantage, EAS Edge HP, and Unjury. - See handout from pre-op Bariatric Education Class: °o Slowly increase the amount of protein shake you drink as tolerated °o You may find it easier to slowly sip shakes throughout the day °o It is important to get your proteins in first °• Your fluid goal is to drink 64-100 ounces of fluid daily °o It may take a few weeks to build up to this  °• 32 oz. (or more) should be clear liquids °And °• 32 oz. (or more) should be full liquids (see below for examples) °• Liquids should not contain sugar, caffeine, or carbonation ° °Clear Liquids: °• Water of Sugar-free flavored water (i.e. Fruit H²O, Propel) °• Decaffeinated coffee or tea (sugar-free) °• Crystal lite, Wyler’s Lite, Minute Maid Lite °• Sugar-free Jell-O °• Bouillon or broth °• Sugar-free Popsicle:    - Less than 20 calories  each; Limit 1 per day ° °Full Liquids: °                  Protein Shakes/Drinks + 2 choices per day of other full liquids °• Full liquids must be: °o No More Than 12 grams of Carbs per serving °o No More Than 3 grams of Fat per serving °• Strained low-fat cream soup °• Non-Fat milk °• Fat-free Lactaid Milk °• Sugar-free yogurt (Dannon Lite & Fit, Greek yogurt) ° °  °Vitamins and Minerals • Start 1 day after surgery unless otherwise directed by your surgeon °• 2 Chewable Bariatric Multivitamin / Multimineral Supplement with iron °• Chewable Calcium Citrate with Vitamin D-3 °(Example: 3 Chewable Calcium  Plus 600 with Vitamin D-3) °o Take 500 mg three (3) times a day for a total of 1500 mg each day °o Do not take all 3 doses of calcium   at one time as it may cause constipation, and you can only absorb 500 mg at a time °o Do not mix multivitamins containing iron with calcium supplements;  take 2 hours apart °• Menstruating women and those at risk for anemia ( a blood disease that causes weakness) may need extra iron °o Talk to your doctor to see if you need more iron °• If you need extra iron: Total daily Iron recommendation (including Vitamins) is 50 to 100 mg Iron/day °• Do not stop taking or change any vitamins or minerals until you talk to your nutritionist or surgeon °• Your nutritionist and/or surgeon must approve all vitamin and mineral supplements °  °Activity and Exercise: It is important to continue walking at home. Limit your physical activity as instructed by your doctor. During this time, use these guidelines: °• Do not lift anything greater than ten  (10) pounds for at least two (2) weeks °• Do not go back to work or drive until your surgeon says you can °• You may have sex when you feel comfortable °o It is VERY important for female patients to use a reliable birth control method; fertility often increase after surgery °o Do not get pregnant for at least 18 months °• Start exercising as soon as your  doctor tells you that you can °o Make sure your doctor approves any physical activity °• Start with a simple walking program °• Walk 5-15 minutes each day, 7 days per week °• Slowly increase until you are walking 30-45 minutes per day °• Consider joining our BELT program. [phone] or email [email or web address].[email or web address] °  °Special Instructions Things to remember: °• Use your CPAP when sleeping if this applies to you °• Consider buying a medical alert bracelet that says you had lap-band surgery °  °  You will likely have your first fill (fluid added to your band) 6 - 8 weeks after surgery °• [facility] has a free Bariatric Surgery Support Group that meets monthly, the 3rd Thursday, 6pm. [facility] [facility] Classrooms. You can see classes online at [email or web address] °• It is very important to keep all follow up appointments with your surgeon, nutritionist, primary care physician, and behavioral health practitioner °o After the first year, please follow up with your bariatric surgeon and nutritionist at least once a year in order to maintain best weight loss results °      °             [facility]:  [phone] ° °             [facility]: [phone] ° °             Bariatric Nurse Coordinator: [phone]  °Gastric Bypass/Sleeve Home Care Instructions  Rev. 06/2012    ° °                                                    Reviewed and Endorsed °                                                   by [facility] Patient Education Committee, Jan, 2014 ° ° ° ° ° ° ° ° ° °

## 2017-02-21 NOTE — Progress Notes (Signed)
Patient alert and oriented, pain is controlled. Patient is tolerating fluids, advanced to protein shake today, patient is tolerating well.  Reviewed Gastric sleeve discharge instructions with patient and patient is able to articulate understanding.  Provided information on BELT program, Support Group and [facility] outpatient pharmacy. All questions answered, will continue to monitor.  

## 2017-02-21 NOTE — Consult Note (Signed)
   [facility] CM Inpatient Consult   02/21/2017  [place] 1980/10/28 [id number]    Came to visit Mrs. Reale at bedside on behalf of [facility] Care Management program for [facility] employees/dependents with [facility] insurance.  Mrs. Micallef reports she is active with the [employer]   Agreeable to post discharge call.   Provided 24-hr nurse line magnet and contact information.   Appreciative of visit.   Marthenia Rolling, MSN-Ed, RN,BSN [facility] Liaison [phone]

## 2017-02-21 NOTE — Progress Notes (Signed)
Paged Dr Marlou Starks to request gas medication. Received verbal order for simethicone 80mg  PO. Patient has been ambulating and changing positions.

## 2017-02-22 ENCOUNTER — Ambulatory Visit (INDEPENDENT_AMBULATORY_CARE_PROVIDER_SITE_OTHER): Payer: 59 | Admitting: Family Medicine

## 2017-02-22 ENCOUNTER — Telehealth: Payer: Self-pay | Admitting: Adult Health

## 2017-02-22 ENCOUNTER — Other Ambulatory Visit: Payer: Self-pay | Admitting: *Deleted

## 2017-02-22 ENCOUNTER — Encounter: Payer: Self-pay | Admitting: Family Medicine

## 2017-02-22 VITALS — BP 110/72 | HR 65 | Ht 68.0 in | Wt 284.6 lb

## 2017-02-22 DIAGNOSIS — E119 Type 2 diabetes mellitus without complications: Secondary | ICD-10-CM

## 2017-02-22 DIAGNOSIS — Z6841 Body Mass Index (BMI) 40.0 and over, adult: Secondary | ICD-10-CM | POA: Diagnosis not present

## 2017-02-22 DIAGNOSIS — B372 Candidiasis of skin and nail: Secondary | ICD-10-CM | POA: Diagnosis not present

## 2017-02-22 MED ORDER — CLOTRIMAZOLE 1 % EX CREA: 1.0000 "application " | TOPICAL_CREAM | Freq: Three times a day (TID) | CUTANEOUS | 0 refills | Status: DC | PRN

## 2017-02-22 MED ORDER — NYSTATIN 100000 UNIT/GM EX POWD: Freq: Four times a day (QID) | CUTANEOUS | 0 refills | Status: DC

## 2017-02-22 NOTE — Progress Notes (Signed)
  Pt here for an acute care OV today   Impression and Recommendations:    1. Candidiasis, intertrigo   2. Morbid obesity with BMI of 45.0-49.9, adult (HCC)   3. Type 2 diabetes mellitus without complication, without long-term current use of insulin (HCC)     Explained to patient due to her diabetes and recently getting antibiotics from her surgery, this was directly related to her surgery she had on Tuesday and antibiotic she got.  The patient was counseled, risk factors were discussed, anticipatory guidance given.  Handouts provided if desired   Meds ordered this encounter  Medications  . clotrimazole (RA CLOTRIMAZOLE) 1 % cream    Sig: Apply 1 application topically 3 (three) times daily as needed.    Dispense:  60 g    Refill:  0  . nystatin (MYCOSTATIN/NYSTOP) powder    Sig: Apply topically 4 (four) times daily.    Dispense:  60 g    Refill:  0    Gross side effects, risk and benefits, and alternatives of medications and treatment plan in general discussed with patient.  Patient is aware that all medications have potential side effects and we are unable to predict every side effect or drug-drug interaction that may occur.   Patient will call with any questions prior to using medication if they have concerns.  Expresses verbal understanding and consents to current therapy and treatment regimen.  No barriers to understanding were identified.  Red flag symptoms and signs discussed in detail.  Patient expressed understanding regarding what to do in case of emergency\urgent symptoms  Please see AVS handed out to patient at the end of our visit for further patient instructions/ counseling done pertaining to today's office visit.   Return if symptoms worsen or fail to improve.     Note: This document was prepared using Dragon voice recognition software and may include unintentional dictation errors.  Deborah Opalski 12:14  PM --------------------------------------------------------------------------------------------------------------------------------------------------------------------------------------------------------------------------------------------    Subjective:    CC:  Chief Complaint  Patient presents with  . Rash    lower abd and inside thighs     HPI: [name] [name] is a 36 y.o. female who presents to [facility] today for issues as discussed below.  Patient had gastric sleeve surgery on this Tuesday, just 3 days ago.  She was released from the hospital yesterday.  She developed a abdominal rash in the skin fold of her abdomen\lower pannus 1-2 days ago.  It was not seen by the surgical team upon discharge.  Patient was given several antibiotics perioperatively.  Patient has never had a yeast or skin infection in the past.  Patient called her surgeon to discuss this with them and was told to contact us.  No problems updated.   Wt Readings from Last 3 Encounters:  07/30/17 229 lb (103.9 kg)  05/30/17 245 lb (111.1 kg)  05/15/17 244 lb (110.7 kg)   BP Readings from Last 3 Encounters:  07/30/17 115/79  05/30/17 110/70  05/15/17 134/90   BMI Readings from Last 3 Encounters:  07/30/17 35.32 kg/m  05/30/17 37.81 kg/m  05/15/17 37.10 kg/m     Patient Care Team    Relationship Specialty Notifications Start End  Danford, Katy D, NP PCP - General Family Medicine  10/18/16      Patient Active Problem List   Diagnosis Date Noted  . Dehydration 03/12/2017  . 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 Sleep apnea   01/09/2017  . Morbid obesity with BMI of 45.0-49.9, adult (HCC) 01/09/2017  . Healthcare maintenance 10/18/2016  . Type 2 diabetes mellitus without complications (HCC) 10/18/2016  . LOW BACK PAIN, ACUTE 11/20/2006  . POLYCYSTIC OVARY 07/04/2006  . OBESITY, NOS 07/04/2006  . TOBACCO USE, QUIT 07/04/2006    Past Medical  history, Surgical history, Family history, Social history, Allergies and Medications have been entered into the medical record, reviewed and changed as needed.    Current Meds  Medication Sig  . blood glucose meter kit and supplies KIT Dispense based on patient and insurance preference. Use up to four times daily as directed. (FOR ICD-9 250.00, 250.01).  . diazepam (VALIUM) 5 MG tablet Take 1 tablet (5 mg total) by mouth every 8 (eight) hours as needed for anxiety.  . levonorgestrel (MIRENA) 20 MCG/24HR IUD 1 each by Intrauterine route once.  . Multiple Vitamins-Iron (MULTIVITAMINS WITH IRON) TABS tablet Take 1 tablet by mouth 2 (two) times daily. CHEWABLE  . pantoprazole (PROTONIX) 40 MG tablet   . Probiotic Product (DIGESTIVE ADVANTAGE GUMMIES) CHEW Chew 1 each by mouth 3 (three) times daily.   . [DISCONTINUED] calcium carbonate (TUMS - DOSED IN MG ELEMENTAL CALCIUM) 500 MG chewable tablet Chew 3 tablets by mouth daily as needed for indigestion or heartburn.  . [DISCONTINUED] ibuprofen (ADVIL,MOTRIN) 800 MG tablet Take 1 tablet (800 mg total) by mouth every 8 (eight) hours as needed.  . [DISCONTINUED] metFORMIN (GLUCOPHAGE) 500 MG tablet Take 1 tablet (500 mg total) by mouth 2 (two) times daily with a meal. Start by just taking 1 tablet with dinner for the first two weeks, then twice daily. (Patient not taking: Reported on 02/27/2017)  . [DISCONTINUED] ondansetron (ZOFRAN-ODT) 4 MG disintegrating tablet   . [DISCONTINUED] oxyCODONE (ROXICODONE) 5 MG/5ML solution   . [DISCONTINUED] promethazine (PHENERGAN) 25 MG tablet Take 1 tablet (25 mg total) by mouth every 6 (six) hours as needed for nausea or vomiting.    Review of Systems: General:   Denies fever, chills, unexplained weight loss.  Optho/Auditory:   Denies visual changes, blurred vision/LOV Respiratory:   Denies wheeze, DOE more than baseline levels.  Cardiovascular:   Denies chest pain, palpitations, new onset peripheral edema    Gastrointestinal:   Denies nausea, vomiting, diarrhea, abd pain.  Genitourinary: Denies dysuria, freq/ urgency, flank pain or discharge from genitals.  Endocrine:     Denies hot or cold intolerance, polyuria, polydipsia. Musculoskeletal:   Denies unexplained myalgias, joint swelling, unexplained arthralgias, gait problems.  Skin:  Denies new onset rash, suspicious lesions Neurological:     Denies dizziness, unexplained weakness, numbness  Psychiatric/Behavioral:   Denies mood changes, suicidal or homicidal ideations, hallucinations    Objective:   Blood pressure 110/72, pulse 65, height 5' 8" (1.727 m), weight 284 lb 9.6 oz (129.1 kg). Body mass index is 43.27 kg/m. General:  Well Developed, well nourished, appropriate for stated age.  Neuro:  Alert and oriented,  extra-ocular muscles intact  HEENT:  Normocephalic, atraumatic, neck supple Skin:  no gross rash, warm, pink. Cardiac:  RRR, S1 S2 Respiratory:  ECTA B/L and A/P, Not using accessory muscles, speaking in full sentences- unlabored. Vascular:  Ext warm, no cyanosis apprec.; cap RF less 2 sec. Psych:  No HI/SI, judgement and insight good, Euthymic mood. Full Affect.   

## 2017-02-22 NOTE — Discharge Summary (Signed)
Physician Discharge Summary  [name] MRN: [id number] DOB/AGE: Jul 07, 1980 36 y.o.  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  Begun on liquids and ready for discharge on PD 2  Consults: none  Significant Diagnostic Studies: none    Discharge Exam: Blood pressure 115/77, pulse 61, temperature 98 F (36.7 C), temperature source Oral, resp. rate 18, height 5\' 8"  (1.727 m), weight 134.1 kg (295 lb 9.6 oz), SpO2 99 %. Incisions OK  Disposition: 01-Home or Self Care  Discharge Instructions    Ambulate hourly while awake    Complete by:  As directed    Call MD for:  difficulty breathing, headache or visual disturbances    Complete by:  As directed    Call MD for:  persistant dizziness or light-headedness    Complete by:  As directed    Call MD for:  persistant nausea and vomiting    Complete by:  As directed    Call MD for:  redness, tenderness, or signs of infection (pain, swelling, redness, odor or green/yellow discharge around incision site)    Complete by:  As directed    Call MD for:  severe uncontrolled pain    Complete by:  As directed    Call MD for:  temperature >101 F    Complete by:  As directed    Diet bariatric full liquid    Complete by:  As directed    Incentive spirometry    Complete by:  As directed    Perform hourly while awake      Follow-up Information    [facility] [place]. Go on 03/06/2017.   Specialty:  General Surgery Why:  at 145pm Contact information: [address] [place] [address] [phone]        [facility] [place] Follow up.   Specialty:  General Surgery Contact information: [address] [place]  [phone]           Signed: Pedro Earls 02/22/2017, 7:20 AM

## 2017-02-22 NOTE — Telephone Encounter (Signed)
Patient called wanting to be seen about rash at surgical site

## 2017-02-22 NOTE — Patient Outreach (Signed)
Successful post op transition of care phone outreach. [name] [name] is s/p laparoscopic posterior repair of hiatal hernia;  sleeve gastrectomy and upper endoscopy by Dr. Kaylyn Lim on 02/19/17 with D. Gurney Maxin assisting. Preop diagnosis of morbid obesity and GERD. She was discharged to home on 10/8.  [name] [name] says she saw her primary care provider this morning because she developed an abdominal rash in the skin fold of her abdomen\lower pannus and bilateral inner thighs that she describes as burning with some oozing of clear liquid. She was prescribed 1% clotrimazole cream and nystatin powder. A review of her medication list revealed 3 discrepancies: she is taking Protonix;  liquid oxycodone and liquid tylenol were prescribed for post op pain; and her primary care provider told her this morning not to take Metformin unless her blood sugars are not meeting target. She reports a fasting blood sugar of 90 this morning and a blood sugar of 106 three hours after a protein shake. She states her incisions are unremarkable. She reports her pain is well managed with the liquid oxycodone. She states she is ambulating without difficulty. She is tolerating her post op bariatric diet except for one episode of nausea that was treated successfully with phenergan.  A review of her upcoming appointments was completed and in addition to the appointments listed on her hospital summary, she says she will see her surgeon on 10/31 and 11/28.  In addition to the prescribed antifungal cream and powder to treat her candidiasis skin rash,  this RNCM suggested she keep a clean rolled towel under her pannus to prevent skin to skin contact to speed healing.  Advised [name] [name] that [facility] will resume with disease self management assistance via the [facility] on 02/25/17. Barrington Ellison RN,CCM,CDE [facility] Management Coordinator [facility] [employer] [phone] Office  Fax [phone]

## 2017-02-22 NOTE — Telephone Encounter (Signed)
Pt c/o rash approximately 4 inches below bariatric surgical site.  Pt states the rash runs from hip to hip, down into her thighs and groins.  Pt describes the rash as very itchy and painful, red, "splotchy", raised and flat in different areas, has "bubbles" and is oozing a clear discharge.  Per Dr. Raliegh Scarlet, pt to be seen today at 10:45 am.  Pt informed and is agreeable.  Charyl Bigger, CMA

## 2017-02-22 NOTE — Patient Instructions (Addendum)
Please let us know if this is spreading and/or worsening by next week.  We can always use oral Diflucan once weekly 4 to help speed this along.  Try to keep the areas open to air as much as possible and exposed to light  Intertrigo Intertrigo is skin irritation or inflammation (dermatitis) that occurs when folds of skin rub together. The irritation can cause a rash and make skin raw and itchy. This condition most commonly occurs in the skin folds of these areas:  Toes.  Armpits.  Groin.  Belly.  Breasts.  Buttocks.  Intertrigo is not passed from person to person (is not contagious). What are the causes? This condition is caused by heat, moisture, friction, and lack of air circulation. The condition can be made worse by:  Sweat.  Bacteria or a fungus, such as yeast.  What increases the risk? This condition is more likely to occur if you have moisture in your skin folds. It is also more likely to develop in people who:  Have diabetes.  Are overweight.  Are on bed rest.  Live in a warm and moist climate.  Wear splints, braces, or other medical devices.  Are not able to control their bowels or bladder (have incontinence).  What are the signs or symptoms? Symptoms of this condition include:  A pink or red skin rash.  Brown patches on the skin.  Raw or scaly skin.  Itchiness.  A burning feeling.  Bleeding.  Leaking fluid.  A bad smell.  How is this diagnosed? This condition is diagnosed with a medical history and physical exam. You may also have a skin swab to test for bacteria or a fungus, such as yeast. How is this treated? Treatment may include:  Cleaning and drying your skin.  An oral antibiotic medicine or antibiotic skin cream for a bacterial infection.  Antifungal cream or pills for an infection that was caused by a fungus, such as yeast.  Steroid ointment to relieve itchiness and irritation.  Follow these instructions at home:  Keep the  affected area clean and dry.  Do not scratch your skin.  Stay in a cool environment as much as possible. Use an air conditioner or fan, if available.  Apply over-the-counter and prescription medicines only as told by your health care provider.  If you were prescribed an antibiotic medicine, use it as told by your health care provider. Do not stop using the antibiotic even if your condition improves.  Keep all follow-up visits as told by your health care provider. This is important. How is this prevented?  Maintain a healthy weight.  Take care of your feet, especially if you have diabetes. Foot care includes: ? Wearing shoes that fit well. ? Keeping your feet dry. ? Wearing clean, breathable socks.  Protect the skin around your groin and buttocks, especially if you have incontinence. Skin protection includes: ? Following a regular cleaning routine. ? Using moisturizers and skin protectants. ? Changing protection pads frequently.  Do not wear tight clothes. Wear clothes that are loose and absorbent. Wear clothes that are made of cotton.  Wear a bra that gives good support, if needed.  Shower and dry yourself thoroughly after activity. Use a hair dryer on a cool setting to dry between skin folds, especially after you bathe.  If you have diabetes, keep your blood sugar under control. Contact a health care provider if:  Your symptoms do not improve with treatment.  Your symptoms get worse or they spread.  You notice increased redness and warmth.  You have a fever. This information is not intended to replace advice given to you by your health care provider. Make sure you discuss any questions you have with your health care provider. Document Released: 04/23/2005 Document Revised: 09/29/2015 Document Reviewed: 10/25/2014 Elsevier Interactive Patient Education  2018 [employer].

## 2017-02-25 ENCOUNTER — Telehealth (HOSPITAL_COMMUNITY): Payer: Self-pay

## 2017-02-25 NOTE — Telephone Encounter (Signed)
Follow up with bariatric surgical patient to discuss post discharge questions.  1.  Are you having any pain not relieved by pain medication?using once per day relieves pain  2.  How much fluid total fluid intake have you had in the last 24/48 hours?  48 ounces fluid  3.  How much protein intake have you had in the last 24/48 hours?45 grams of protein  4.  Have you had any trouble making urine?no trouble  5.  Have you had nausea that has not been relieved by nausea medication?no  6.  Are you ambulating every hour?yes  7.  Are you passing gas or had a BM?NO BM taking Miralax  8.  Do you know how to contact [place]? [facility]? [facility]?yes  9.  Are you taking your vitamins and calcium without difficulty?taking vitamins and calcium  10. Tell me how your incision looks?  Any redness, open incision, or drainage?incsions look good

## 2017-02-27 ENCOUNTER — Ambulatory Visit (INDEPENDENT_AMBULATORY_CARE_PROVIDER_SITE_OTHER): Payer: 59 | Admitting: Adult Health

## 2017-02-27 ENCOUNTER — Encounter: Payer: Self-pay | Admitting: Adult Health

## 2017-02-27 VITALS — BP 108/75 | HR 80 | Ht 68.0 in | Wt 271.7 lb

## 2017-02-27 DIAGNOSIS — E119 Type 2 diabetes mellitus without complications: Secondary | ICD-10-CM

## 2017-02-27 DIAGNOSIS — N912 Amenorrhea, unspecified: Secondary | ICD-10-CM | POA: Diagnosis not present

## 2017-02-27 DIAGNOSIS — Z0000 Encounter for general adult medical examination without abnormal findings: Secondary | ICD-10-CM | POA: Diagnosis not present

## 2017-02-27 LAB — POCT URINE PREGNANCY: Preg Test, Ur: NEGATIVE

## 2017-02-27 NOTE — Progress Notes (Signed)
OV Notes from 02/22/2017: HPI: [name] [name] is a 36 y.o. female who presents to [place] at [facility] today for issues as discussed below.  Patient had gastric sleeve surgery on this Tuesday, just 3 days ago.  She was released from the hospital yesterday.  She developed a abdominal rash in the skin fold of her abdomen\lower pannus 1-2 days ago.  It was not seen by the surgical team upon discharge.  Patient was given several antibiotics perioperatively.  Patient has never had a yeast or skin infection in the past.  Patient called her surgeon to discuss this with them and was told to contact us.  Today's OV Notes 02/27/2017: [name] [name] is here for f/u after gastric sleeve surgery and to discuss whether or not she needs to continue with Metformin.  Her last A1c 01/09/2017 was 6.4. Her AM BS are running 90s and she is on only a liquid diet. She is walking 57mns every hr, 6 hrs a day and will increase to 138ms every hr for 6 hrs beginning tomorrow. She has f/u with surgeon next week. She denies CP/dyspnea/hypoglycemia. She reports rash has dramatically improved from last week. Of Note:  She had +blood pregnancy test that was eventually determined to be a false +, however she would like a urine pregnancy test today.  Patient Care Team    Relationship Specialty Notifications Start End  DaEsaw GrandchildNP PCP - General Family Medicine  10/18/16     Patient Active Problem List   Diagnosis Date Noted  . 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 Sleep apnea 01/09/2017  . Morbid obesity with BMI of 45.0-49.9, adult ([place]09/09/2016  . Healthcare maintenance 10/18/2016  . Type 2 diabetes mellitus without complications ([id number]. LOW BACK PAIN, ACUTE 11/20/2006  . POLYCYSTIC OVARY 07/04/2006  . OBESITY, NOS 07/04/2006  . TOBACCO USE, QUIT 07/04/2006     Past Medical History:  Diagnosis Date  . Abnormal uterine bleeding   . Amenorrhea   . Anemia   .  Anxiety    panic attacks  . Diabetes mellitus without complication ([facility]   type 2  . Dysmenorrhea   . GERD (gastroesophageal reflux disease)    occasional - diet controlled and tums prn  . Hyperlipidemia    no meds -diet controlled  . Infertility, female   . Insulin resistance   . Migraine without aura   . Missed abortion    x 2 - both resolved without surgery  . Obesity   . PCOS (polycystic ovarian syndrome)   . PCOS (polycystic ovarian syndrome)   . PONV (postoperative nausea and vomiting)    pe rpatient ; scopalamine patch very helpful   . Sleep apnea   . SVD (spontaneous vaginal delivery)    x 1  . Urinary incontinence      Past Surgical History:  Procedure Laterality Date  . CHOLECYSTECTOMY    . COLONOSCOPY  2017   polyps  . CYSTOSCOPY N/A 10/29/2016   Procedure: CYSTOSCOPY;  Surgeon: JeSalvadore DomMD;  [facility];  Service: Gynecology;  Laterality: N/A;  . DILATATION & CURRETTAGE/HYSTEROSCOPY WITH RESECTOCOPE N/A 10/17/2012   Procedure: DILATATION & CURETTAGE/HYSTEROSCOPY WITH RESECTOCOPE; Possible Polypectomy, Possible Resectoscopic Myomectomy.;  Surgeon: KeFloyce StakesFoPamala HurryMD;  [facility];  Service: Gynecology;  Laterality: N/A;  1 hr.  . Marland KitchenILATION AND CURETTAGE OF UTERUS    . ENDOMETRIAL BIOPSY    . ESOPHAGOGASTRODUODENOSCOPY ENDOSCOPY  2017   Normal  . HYSTEROSCOPY W/D&C N/A 12/01/2013   Procedure: IUD Removal;  Surgeon: Claiborne Billings A. Pamala Hurry, MD;  Location: [facility];  Service: Gynecology;  Laterality: N/A;  90 min.  . INTRAUTERINE DEVICE (IUD) INSERTION N/A 10/17/2012   Procedure: INTRAUTERINE DEVICE (IUD) INSERTION; Mirena;  Surgeon: Floyce Stakes. Pamala Hurry, MD;  Location: [facility];  Service: Gynecology;  Laterality: N/A;  . LAPAROSCOPIC GASTRIC SLEEVE RESECTION N/A 02/19/2017   Procedure: LAPAROSCOPIC GASTRIC SLEEVE RESECTION, UPPER ENDOSCOPY;  Surgeon: Johnathan Hausen, MD;  Location: [facility];  Service: General;  Laterality: N/A;  . LAPAROSCOPIC SALPINGO  OOPHERECTOMY Left 10/29/2016   Procedure: LAPAROSCOPIC SALPINGO OOPHORECTOMY With anterior uterine biopsy;  Surgeon: Salvadore Dom, MD;  Location: [facility];  Service: Gynecology;  Laterality: Left;  . MOUTH SURGERY     wisdom teeth     Family History  Problem Relation Age of Onset  . Diabetes Father   . Thyroid disease Father   . Hypertension Father   . Diabetes Maternal Grandmother   . Heart disease Maternal Grandmother   . Diabetes Maternal Grandfather   . Heart disease Maternal Grandfather   . Stroke Maternal Grandfather   . Diabetes Paternal Grandmother   . Diabetes Paternal Grandfather   . Heart disease Paternal Grandfather   . Alcohol abuse Paternal Grandfather   . Diabetes Other   . Obesity Other   . Sleep apnea Other   . Hyperlipidemia Other   . Hypertension Other   . Cancer Other   . Heart disease Other   . Hypertension Mother      History  Drug Use No     History  Alcohol Use  . Yes    Comment: occasional     History  Smoking Status  . Former Smoker  . Packs/day: 0.25  . Years: 19.00  . Types: Cigarettes  . Quit date: 06/06/2016  Smokeless Tobacco  . Never Used     Outpatient Encounter Prescriptions as of 02/27/2017  Medication Sig Note  . blood glucose meter kit and supplies KIT Dispense based on patient and insurance preference. Use up to four times daily as directed. (FOR ICD-9 250.00, 250.01).   . clotrimazole (RA CLOTRIMAZOLE) 1 % cream Apply 1 application topically 3 (three) times daily as needed.   . diazepam (VALIUM) 5 MG tablet Take 1 tablet (5 mg total) by mouth every 8 (eight) hours as needed for anxiety.   Marland Kitchen levonorgestrel (MIRENA) 20 MCG/24HR IUD 1 each by Intrauterine route once. 02/18/2017: On hold   . Multiple Vitamins-Iron (MULTIVITAMINS WITH IRON) TABS tablet Take 1 tablet by mouth 2 (two) times daily. CHEWABLE   . nystatin (MYCOSTATIN/NYSTOP) powder Apply topically 4 (four) times daily.   . ondansetron (ZOFRAN-ODT) 4 MG  disintegrating tablet    . OVER THE COUNTER MEDICATION Take 1 tablet by mouth 3 (three) times daily. Bariatric calcium 568m   . pantoprazole (PROTONIX) 40 MG tablet    . Probiotic Product (DIGESTIVE ADVANTAGE GUMMIES) CHEW Chew 1 each by mouth 3 (three) times daily.    . [DISCONTINUED] calcium carbonate (TUMS - DOSED IN MG ELEMENTAL CALCIUM) 500 MG chewable tablet Chew 3 tablets by mouth daily as needed for indigestion or heartburn.   . [DISCONTINUED] oxyCODONE (ROXICODONE) 5 MG/5ML solution    . metFORMIN (GLUCOPHAGE) 500 MG tablet Take 1 tablet (500 mg total) by mouth 2 (two) times daily with a meal. Start by just taking 1 tablet with dinner for the first two weeks, then twice  daily. (Patient not taking: Reported on 02/27/2017)   . SUMAtriptan (IMITREX) 100 MG tablet Take 1 tablet (100 mg total) by mouth once. May repeat in 2 hours if headache persists or recurs.  Take a maximum of 2 tablets per 24 hour period.    No facility-administered encounter medications on file as of 02/27/2017.     Allergies: Iohexol  Body mass index is 41.31 kg/m.  Blood pressure 108/75, pulse 80, height _0  (1.727 m), weight 271 lb 11.2 oz (123.2 kg).   Wt Readings from Last 3 Encounters:  02/22/17 284 lb 9.6 oz (129.1 kg)  02/21/17 295 lb 9.6 oz (134.1 kg)  02/18/17 294 lb (133.4 kg)   BP Readings from Last 3 Encounters:  02/22/17 110/72  02/21/17 115/77  02/18/17 120/78   BMI Readings from Last 3 Encounters:  02/22/17 43.27 kg/m  02/21/17 44.95 kg/m  02/18/17 44.70 kg/m     Patient Care Team    Relationship Specialty Notifications Start End  [name] D, NP PCP - General Family Medicine  10/18/16   Dimitri Ped, [place] [facility]  Admissions 02/21/17      Patient Active Problem List   Diagnosis Date Noted  . 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 Sleep apnea 01/09/2017  . Morbid obesity with BMI of 45.0-49.9, adult ([place])  01/09/2017  . Healthcare maintenance 10/18/2016  . Type 2 diabetes mellitus without complications ([place]) 62/22/9798  . LOW BACK PAIN, ACUTE 11/20/2006  . POLYCYSTIC OVARY 07/04/2006  . OBESITY, NOS 07/04/2006  . TOBACCO USE, QUIT 07/04/2006    Past Medical history, Surgical history, Family history, Social history, Allergies and Medications have been entered into the medical record, reviewed and changed as needed.    No outpatient prescriptions have been marked as taking for the 02/27/17 encounter (Appointment) with Esaw Grandchild, NP.    Allergies:  lohexol    Review of Systems: General:   Denies fever, chills, unexplained weight loss.  Optho/Auditory:   Denies visual changes, blurred vision/LOV Respiratory:   Denies wheeze, DOE more than baseline levels.  Cardiovascular:   Denies chest pain, palpitations, new onset peripheral edema  Gastrointestinal:   Denies nausea, vomiting, diarrhea, abd pain.  Genitourinary: Denies dysuria, freq/ urgency, flank pain or discharge from genitals.  Endocrine:     Denies hot or cold intolerance, polyuria, polydipsia. Musculoskeletal:   Denies unexplained myalgias, joint swelling, unexplained arthralgias, gait problems.  Skin:  Denies new onset rash, suspicious lesions Neurological:     Denies dizziness, unexplained weakness, numbness  Psychiatric/Behavioral:   Denies mood changes, suicidal or homicidal ideations, hallucinations    Objective:   There were no vitals taken for this visit. There is no height or weight on file to calculate BMI. General:  Well Developed, well nourished, appropriate for stated age.  Neuro:  Alert and oriented,  extra-ocular muscles intact  HEENT:  Normocephalic, atraumatic, neck supple Skin: rash on pannus sig reduced from last week Cardiac:  RRR, S1 S2 Respiratory:  ECTA B/L and A/P, Not using accessory muscles, speaking in full sentences- unlabored. Vascular:  Ext warm, no cyanosis apprec.; cap RF less 2  sec. Psych:  No HI/SI, judgement and insight good, Euthymic mood. Full Affect.  1. Type 2 diabetes mellitus without complication, without long-term current use of insulin ([place])   2. Healthcare maintenance     Type 2 diabetes mellitus without complications ([place]) 01/06/1193 A1c 6.4 AM BS 90s She has been off Metformin  since 02/18/2017 Instructed to remain off and we will re-check A1c in 2 months.   Healthcare maintenance Urine Pregnancy test- negative Continue post-op instructions per surgeon, she has f/u next week. Wt 11/2016 was 303 Current wt 271 GREAT JOB!    FOLLOW-UP:  Return in about 2 months (around 04/29/2017) for Regular Follow Up, Obesity, Diabetes.

## 2017-02-27 NOTE — Assessment & Plan Note (Signed)
Urine Pregnancy test- negative Continue post-op instructions per surgeon, she has f/u next week. Wt 11/2016 was 303 Current wt 271 GREAT JOB!

## 2017-02-27 NOTE — Addendum Note (Signed)
Addended by: Fonnie Mu on: 02/27/2017 03:57 PM   Modules accepted: Orders

## 2017-02-27 NOTE — Assessment & Plan Note (Signed)
01/09/2017 A1c 6.4 AM BS 90s She has been off Metformin since 02/18/2017 Instructed to remain off and we will re-check A1c in 2 months.

## 2017-02-27 NOTE — Patient Instructions (Addendum)
Bariatric Surgery, Care After Refer to this sheet in the next few weeks. These instructions provide you with information on caring for yourself after your procedure. Your health care provider may also give you more specific instructions. Your treatment has been planned according to current medical practices, but problems sometimes occur. Call your health care provider if you have any problems or questions after your procedure. What can I expect after the procedure? After your procedure, it is typical to have the following sensations:  Soreness.  Sluggishness.  Tiredness.  Moodiness.  Chilliness.  It is not unusual to have dry skin and some hair loss after bariatric surgery. Follow these instructions at home:  Do not drink alcohol, use public transportation, or sign important papers for at least 1 day following surgery.  Do not resume physical activities or drive until directed by your surgeon.  Avoid lifting anything over 10 pounds (4.5 kg) for 6 weeks following surgery, or until approved by your surgeon.  Only take over-the-counter or prescription medicines for pain, discomfort, or fever as directed by your surgeon.  Resume your diet as directed by your surgeon. You will resume eating with liquids and pureed foods, then soft foods, and progress to a more normal diet over time. Follow your surgeon or dietitian's guidelines for what and how much to eat and drink. You will need to eat slowly, so as not to cause discomfort and vomiting.  Use showers for bathing as directed by your surgeon.  Change dressings as directed by your surgeon.  Schedule a follow-up appointment with your surgeon as directed. Where to find more information: American Society for Bariatric Surgery: www.asbs.org Weight-control Information Network (WIN): win.AmenCredit.is Contact a health care provider if:  There is redness, swelling, or increasing pain in the wound.  There is pus coming from the  wound.  There is drainage from the wound lasting longer than 1 day.  An unexplained oral temperature above 102F (38.9C) develops.  You notice a foul smell coming from the wound or dressing.  There is a breaking open of the wound (edges not staying together) after stitches have been removed.  You notice increasing pain in the shoulder-strap areas.  You develop episodes of dizziness or faint while standing.  You develop shortness of breath.  You develop persistent nausea or vomiting.  Your soreness seems to be getting worse rather than better. Get help right away if:  You develop a rash.  You have difficulty breathing.  You develop, or feel you are developing, any reaction or side effects to medicines. This information is not intended to replace advice given to you by your health care provider. Make sure you discuss any questions you have with your health care provider. Document Released: 04/23/2005 Document Revised: 10/05/2015 Document Reviewed: 10/22/2012 Elsevier Interactive Patient Education  2017 [place].  Urine Pregnancy-Negative! Continue post operative directions per your surgeon. Please continue to remain off Metformin.  We will re-check A1c in 2 months (which will be 9 since last check). GREAT JOB ON THE WEIGHT LOSS!!!

## 2017-03-05 ENCOUNTER — Encounter: Payer: Self-pay | Admitting: Skilled Nursing Facility1

## 2017-03-05 ENCOUNTER — Encounter: Payer: 59 | Attending: Surgery | Admitting: Skilled Nursing Facility1

## 2017-03-05 DIAGNOSIS — Z79899 Other long term (current) drug therapy: Secondary | ICD-10-CM | POA: Insufficient documentation

## 2017-03-05 DIAGNOSIS — F419 Anxiety disorder, unspecified: Secondary | ICD-10-CM | POA: Diagnosis not present

## 2017-03-05 DIAGNOSIS — K219 Gastro-esophageal reflux disease without esophagitis: Secondary | ICD-10-CM | POA: Insufficient documentation

## 2017-03-05 DIAGNOSIS — F329 Major depressive disorder, single episode, unspecified: Secondary | ICD-10-CM | POA: Diagnosis not present

## 2017-03-05 DIAGNOSIS — Z713 Dietary counseling and surveillance: Secondary | ICD-10-CM | POA: Insufficient documentation

## 2017-03-05 DIAGNOSIS — E119 Type 2 diabetes mellitus without complications: Secondary | ICD-10-CM | POA: Diagnosis not present

## 2017-03-05 DIAGNOSIS — M549 Dorsalgia, unspecified: Secondary | ICD-10-CM | POA: Insufficient documentation

## 2017-03-05 DIAGNOSIS — E7800 Pure hypercholesterolemia, unspecified: Secondary | ICD-10-CM | POA: Diagnosis not present

## 2017-03-05 NOTE — Progress Notes (Signed)
Bariatric Class:  Appt start time: 1530 end time:  1630.  2 Week Post-Operative Nutrition Class  Patient was seen on 03/05/2017 for [facility]ative [facility]   Surgery date: 02/19/2017 Surgery type: sleeve Start weight at [facility]: 296.9 Weight today: 268  TANITA  BODY COMP RESULTS  03/05/2017   BMI (kg/m^2) 40.7   Fat Mass (lbs) 130.6   Fat Free Mass (lbs) 137.4   Total Body Water (lbs) 101.8   The following the learning objectives were met by the patient during this course:  Identifies Phase 3A (Soft, High Proteins) Dietary Goals and will begin from 2 weeks post-operatively to 2 months post-operatively  Identifies appropriate sources of fluids and proteins   States protein recommendations and appropriate sources post-operatively  Identifies the need for appropriate texture modifications, mastication, and bite sizes when consuming solids  Identifies appropriate multivitamin and calcium sources post-operatively  Describes the need for physical activity post-operatively and will follow MD recommendations  States when to call healthcare provider regarding medication questions or post-operative complications  Handouts given during class include:  Phase 3A: Soft, High Protein Diet Handout  Follow-Up Plan: Patient will follow-up at [facility] in 6 weeks for 2 month post-op nutrition visit for diet advancement per MD.    

## 2017-03-11 ENCOUNTER — Other Ambulatory Visit: Payer: Self-pay | Admitting: Surgery

## 2017-03-11 ENCOUNTER — Ambulatory Visit: Payer: Self-pay | Admitting: Neurology

## 2017-03-12 ENCOUNTER — Ambulatory Visit (HOSPITAL_COMMUNITY)
Admission: RE | Admit: 2017-03-12 | Discharge: 2017-03-12 | Disposition: A | Discharge status: Home / Self Care | Payer: 59 | Source: Ambulatory Visit | Attending: Surgery | Admitting: Surgery

## 2017-03-12 DIAGNOSIS — D649 Anemia, unspecified: Secondary | ICD-10-CM | POA: Insufficient documentation

## 2017-03-12 DIAGNOSIS — E860 Dehydration: Secondary | ICD-10-CM | POA: Diagnosis not present

## 2017-03-12 MED ORDER — DEXTROSE IN LACTATED RINGERS 5 % IV SOLN
INTRAVENOUS | Status: AC
  Administered 2017-03-12: 10:00:00 via INTRAVENOUS

## 2017-03-12 NOTE — Progress Notes (Signed)
Diagnosis: Anemia  Provider: Rockne Coons, MD  Procedure: 1L of D5LR over 5 hours.  Patient tolerated well.  Vital signs stable. Discharge instructions given.  Ambulatory at time of discharge.

## 2017-03-12 NOTE — Discharge Instructions (Signed)
°  Received 1000 ml of 5% Dextrose in Lactated Ringer IV over 5 hours. Follow up with ordering physician.

## 2017-03-16 DIAGNOSIS — G4733 Obstructive sleep apnea (adult) (pediatric): Secondary | ICD-10-CM | POA: Diagnosis not present

## 2017-03-20 ENCOUNTER — Telehealth (HOSPITAL_COMMUNITY): Payer: Self-pay

## 2017-03-27 ENCOUNTER — Encounter: Payer: Self-pay | Admitting: Obstetrics and Gynecology

## 2017-03-27 ENCOUNTER — Other Ambulatory Visit: Payer: Self-pay

## 2017-03-27 ENCOUNTER — Ambulatory Visit (INDEPENDENT_AMBULATORY_CARE_PROVIDER_SITE_OTHER): Payer: 59 | Admitting: Obstetrics and Gynecology

## 2017-03-27 VITALS — BP 128/80 | HR 84 | Resp 16 | Wt 261.0 lb

## 2017-03-27 DIAGNOSIS — N898 Other specified noninflammatory disorders of vagina: Secondary | ICD-10-CM

## 2017-03-27 DIAGNOSIS — R1030 Lower abdominal pain, unspecified: Secondary | ICD-10-CM | POA: Diagnosis not present

## 2017-03-27 LAB — POCT URINALYSIS DIPSTICK
Bilirubin, UA: NEGATIVE
Glucose, UA: NEGATIVE
KETONES UA: NEGATIVE
Nitrite, UA: NEGATIVE
PH UA: 5 (ref 5.0–8.0)
Urobilinogen, UA: 0.2 E.U./dL

## 2017-03-27 NOTE — Progress Notes (Signed)
GYNECOLOGY  VISIT   HPI: 36 y.o.   Married  Caucasian  female   [phone] with No LMP recorded. Patient is not currently having periods (Reason: IUD).   here c/o right side pain X 2 days. The pain is constant today, was intermittent yesterday. 3/10 in severity, aching, sometimes sharp. Starts in the RLQ and radiates up toward her right flank.  She has noticed a fishy odor when she voids. She has a mirena IUD she is spotting.  Urine feels "hot", no dysuria, no urinary frequency or urgency.  She has a h/o a laparoscopy, LOA, LSO in 7/18.  GYNECOLOGIC HISTORY: No LMP recorded. Patient is not currently having periods (Reason: IUD). Contraception:IUD Menopausal hormone therapy: none         OB History    Gravida Para Term Preterm AB Living   '3 1 1   2 1   '$ SAB TAB Ectopic Multiple Live Births   2       1         Patient Active Problem List   Diagnosis Date Noted  . Dehydration 03/12/2017  . 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 Sleep apnea 01/09/2017  . Morbid obesity with BMI of 45.0-49.9, adult ([place]) 01/09/2017  . Healthcare maintenance 10/18/2016  . Type 2 diabetes mellitus without complications ([place]) 48/27/0786  . LOW BACK PAIN, ACUTE 11/20/2006  . POLYCYSTIC OVARY 07/04/2006  . OBESITY, NOS 07/04/2006  . TOBACCO USE, QUIT 07/04/2006    Past Medical History:  Diagnosis Date  . Abnormal uterine bleeding   . Amenorrhea   . Anemia   . Anxiety    panic attacks  . Diabetes mellitus without complication ([place])    type 2  . Dysmenorrhea   . GERD (gastroesophageal reflux disease)    occasional - diet controlled and tums prn  . Hyperlipidemia    no meds -diet controlled  . Infertility, female   . Insulin resistance   . Migraine without aura   . Missed abortion    x 2 - both resolved without surgery  . Obesity   . PCOS (polycystic ovarian syndrome)   . PCOS (polycystic ovarian syndrome)   . PONV (postoperative nausea and vomiting)    pe rpatient ;  scopalamine patch very helpful   . Sleep apnea   . SVD (spontaneous vaginal delivery)    x 1  . Urinary incontinence     Past Surgical History:  Procedure Laterality Date  . CHOLECYSTECTOMY    . COLONOSCOPY  2017   polyps  . CYSTOSCOPY N/A 10/29/2016   Procedure: CYSTOSCOPY;  Surgeon: Salvadore Dom, MD;  Location: [facility];  Service: Gynecology;  Laterality: N/A;  . DILATATION & CURRETTAGE/HYSTEROSCOPY WITH RESECTOCOPE N/A 10/17/2012   Procedure: DILATATION & CURETTAGE/HYSTEROSCOPY WITH RESECTOCOPE; Possible Polypectomy, Possible Resectoscopic Myomectomy.;  Surgeon: Floyce Stakes. Pamala Hurry, MD;  Location: [facility];  Service: Gynecology;  Laterality: N/A;  1 hr.  Marland Kitchen DILATION AND CURETTAGE OF UTERUS    . ENDOMETRIAL BIOPSY    . ESOPHAGOGASTRODUODENOSCOPY ENDOSCOPY  2017   Normal  . HYSTEROSCOPY W/D&C N/A 12/01/2013   Procedure: IUD Removal;  Surgeon: Claiborne Billings A. Pamala Hurry, MD;  Location: [facility];  Service: Gynecology;  Laterality: N/A;  90 min.  . INTRAUTERINE DEVICE (IUD) INSERTION N/A 10/17/2012   Procedure: INTRAUTERINE DEVICE (IUD) INSERTION; Mirena;  Surgeon: Floyce Stakes. Pamala Hurry, MD;  Location: [facility];  Service: Gynecology;  Laterality: N/A;  . LAPAROSCOPIC GASTRIC SLEEVE RESECTION N/A 02/19/2017  Procedure: LAPAROSCOPIC GASTRIC SLEEVE RESECTION, UPPER ENDOSCOPY;  Surgeon: Johnathan Hausen, MD;  Location: [facility];  Service: General;  Laterality: N/A;  . LAPAROSCOPIC SALPINGO OOPHERECTOMY Left 10/29/2016   Procedure: LAPAROSCOPIC SALPINGO OOPHORECTOMY With anterior uterine biopsy;  Surgeon: Salvadore Dom, MD;  Location: [facility];  Service: Gynecology;  Laterality: Left;  . MOUTH SURGERY     wisdom teeth    Current Outpatient Medications  Medication Sig Dispense Refill  . blood glucose meter kit and supplies KIT Dispense based on patient and insurance preference. Use up to four times daily as directed. (FOR ICD-9 250.00, 250.01). 1 each 0  . diazepam (VALIUM) 5 MG tablet Take 1 tablet (5 mg  total) by mouth every 8 (eight) hours as needed for anxiety. 60 tablet 0  . levonorgestrel (MIRENA) 20 MCG/24HR IUD 1 each by Intrauterine route once.    . Multiple Vitamins-Iron (MULTIVITAMINS WITH IRON) TABS tablet Take 1 tablet by mouth 2 (two) times daily. CHEWABLE    . nystatin (MYCOSTATIN/NYSTOP) powder Apply topically 4 (four) times daily. 60 g 0  . ondansetron (ZOFRAN-ODT) 4 MG disintegrating tablet   0  . OVER THE COUNTER MEDICATION Take 1 tablet by mouth 3 (three) times daily. Bariatric calcium '500mg'$     . pantoprazole (PROTONIX) 40 MG tablet   3  . Probiotic Product (DIGESTIVE ADVANTAGE GUMMIES) CHEW Chew 1 each by mouth 3 (three) times daily.     . SUMAtriptan (IMITREX) 100 MG tablet Take 1 tablet (100 mg total) by mouth once. May repeat in 2 hours if headache persists or recurs.  Take a maximum of 2 tablets per 24 hour period. 15 tablet 0   No current facility-administered medications for this visit.      ALLERGIES: Iohexol  Family History  Problem Relation Age of Onset  . Diabetes Father   . Thyroid disease Father   . Hypertension Father   . Diabetes Maternal Grandmother   . Heart disease Maternal Grandmother   . Diabetes Maternal Grandfather   . Heart disease Maternal Grandfather   . Stroke Maternal Grandfather   . Diabetes Paternal Grandmother   . Diabetes Paternal Grandfather   . Heart disease Paternal Grandfather   . Alcohol abuse Paternal Grandfather   . Diabetes Other   . Obesity Other   . Sleep apnea Other   . Hyperlipidemia Other   . Hypertension Other   . Cancer Other   . Heart disease Other   . Hypertension Mother     Social History   Socioeconomic History  . Marital status: Married    Spouse name: Not on file  . Number of children: Not on file  . Years of education: Not on file  . Highest education level: Not on file  Social Needs  . Financial resource strain: Not on file  . Food insecurity - worry: Not on file  . Food insecurity - inability:  Not on file  . Transportation needs - medical: Not on file  . Transportation needs - non-medical: Not on file  Occupational History  . Not on file  Tobacco Use  . Smoking status: Former Smoker    Packs/day: 0.25    Years: 19.00    Pack years: 4.75    Types: Cigarettes    Last attempt to quit: 06/06/2016    Years since quitting: 0.8  . Smokeless tobacco: Never Used  Substance and Sexual Activity  . Alcohol use: Yes    Comment: occasional  . Drug use: No  .  Sexual activity: Yes    Partners: Male    Birth control/protection: None, IUD  Other Topics Concern  . Not on file  Social History Narrative  . Not on file    Review of Systems  Constitutional: Negative.   HENT: Negative.   Eyes: Negative.   Respiratory: Negative.   Cardiovascular: Negative.   Gastrointestinal: Negative.   Genitourinary:       Right side pain   Musculoskeletal: Negative.   Skin: Negative.   Neurological: Negative.   Endo/Heme/Allergies: Negative.   Psychiatric/Behavioral: Negative.     PHYSICAL EXAMINATION:    BP 128/80 (BP Location: Right Arm, Patient Position: Sitting, Cuff Size: Large)   Pulse 84   Resp 16   Wt 261 lb (118.4 kg)   BMI 39.68 kg/m     General appearance: alert, cooperative and appears stated age Abdomen: soft, mildly tender in the RLQ and under the umbilicus, no rebound, no guarding, no masses; non distended, no masses,  no organomegaly CVA: not tender  Pelvic: External genitalia:  no lesions              Urethra:  normal appearing urethra with no masses, tenderness or lesions              Bartholins and Skenes: normal                 Vagina: normal appearing vagina with normal color and discharge, no lesions              Cervix: no lesions and IUD string 3 cm              Bimanual Exam:  Uterus:  normal size, contour, position, consistency, mobility, non-tender              Adnexa: no masses, tender in the right adnexa.                Chaperone was present for  exam.  Urine dip: 3+, 1+ leuk (she is having vaginal bleeding).  ASSESSMENT RLQ abdominal pain, radiating to her flank. Mildly tender on abdominal exam and in the right adnexa ? Mild dysuria "feels hot" Vaginal vs urinary odor     PLAN Urine for ua, c&s Affirm Take tylenol or ibuprofen for pain (she will check with GI if she can take ibuprofen) If not better by next week will set her up for a GYN ultrasound Call with worsening symptoms over the weekend   An After Visit Summary was printed and given to the patient.

## 2017-03-28 LAB — URINE CULTURE

## 2017-03-28 LAB — URINALYSIS, MICROSCOPIC ONLY
CASTS: NONE SEEN /LPF
Epithelial Cells (non renal): >10 /hpf — AB (ref 0–10)

## 2017-03-28 LAB — VAGINITIS/VAGINOSIS, DNA PROBE
Candida Species: NEGATIVE
GARDNERELLA VAGINALIS: POSITIVE — AB
TRICHOMONAS VAG: NEGATIVE

## 2017-03-31 ENCOUNTER — Other Ambulatory Visit: Payer: Self-pay | Admitting: Obstetrics and Gynecology

## 2017-03-31 MED ORDER — METRONIDAZOLE 0.75 % VA GEL: 1.0000 | Freq: Every day | VAGINAL | 0 refills | Status: DC

## 2017-04-01 MED FILL — metroNIDAZOLE 0.75 % GEL: 0.75 | 5 days supply | Qty: 70 | Fill #0

## 2017-04-08 ENCOUNTER — Encounter: Payer: Self-pay | Admitting: Adult Health

## 2017-04-11 ENCOUNTER — Ambulatory Visit: Payer: 59 | Admitting: Obstetrics and Gynecology

## 2017-04-15 DIAGNOSIS — G4733 Obstructive sleep apnea (adult) (pediatric): Secondary | ICD-10-CM | POA: Diagnosis not present

## 2017-04-17 ENCOUNTER — Encounter: Payer: 59 | Attending: Surgery | Admitting: Skilled Nursing Facility1

## 2017-04-17 ENCOUNTER — Encounter: Payer: Self-pay | Admitting: Skilled Nursing Facility1

## 2017-04-17 DIAGNOSIS — Z713 Dietary counseling and surveillance: Secondary | ICD-10-CM | POA: Insufficient documentation

## 2017-04-17 DIAGNOSIS — E119 Type 2 diabetes mellitus without complications: Secondary | ICD-10-CM | POA: Insufficient documentation

## 2017-04-17 DIAGNOSIS — M549 Dorsalgia, unspecified: Secondary | ICD-10-CM | POA: Diagnosis not present

## 2017-04-17 DIAGNOSIS — K219 Gastro-esophageal reflux disease without esophagitis: Secondary | ICD-10-CM | POA: Insufficient documentation

## 2017-04-17 DIAGNOSIS — E7800 Pure hypercholesterolemia, unspecified: Secondary | ICD-10-CM | POA: Diagnosis not present

## 2017-04-17 DIAGNOSIS — Z79899 Other long term (current) drug therapy: Secondary | ICD-10-CM | POA: Insufficient documentation

## 2017-04-17 DIAGNOSIS — F329 Major depressive disorder, single episode, unspecified: Secondary | ICD-10-CM | POA: Diagnosis not present

## 2017-04-17 DIAGNOSIS — Z6841 Body Mass Index (BMI) 40.0 and over, adult: Secondary | ICD-10-CM

## 2017-04-17 DIAGNOSIS — F419 Anxiety disorder, unspecified: Secondary | ICD-10-CM | POA: Diagnosis not present

## 2017-04-17 NOTE — Patient Instructions (Addendum)
-  Try to take larger gulps of water  -Stay aware of needing to eat and drink   -Try the multivitamin capsule with food on your stomach   -Aim to have vegetables with every lunch and every dinner ever day  -Try the kefir in the yogurt aisle

## 2017-04-17 NOTE — Progress Notes (Signed)
Follow-up visit:  8 Weeks Post-Operative sleeve Surgery  Primary concerns today: Post-operative Bariatric Surgery Nutrition Management.    Pt states she is No longer taking metformin taking Protonix every day. Pt did need to go to rehydrating clinic. Pt is very happy with her progress.   Surgery date: 02/19/2017 Surgery type: sleeve Start weight at [facility]: 296.9 Weight today: 252.6 Weight change: 15.4  TANITA  BODY COMP RESULTS  03/05/2017 04/17/2017   BMI (kg/m^2) 40.7 38.4   Fat Mass (lbs) 130.6 102.4   Fat Free Mass (lbs) 137.4 150.2   Total Body Water (lbs) 101.8 110.2   24-hr recall: skipping meals sometimes on the weekends  B (AM): protein shake Snk (AM): egg or [place] sausage L (PM): tuna pack or 1 oz chicken breast  Snk (PM): greek yogurt or low fat string cheese D (PM): protein broth with added chicken with pains or shrimp or salmon  Snk (PM):   Fluid intake: water: 48-60 Estimated total protein intake: 60+  Medications: See List  Supplementation: trouble tolerating the multivitamin   CBG monitoring:  Average CBG per patient: 92-96 Last patient reported A1c:   Using straws: no Drinking while eating: no Having you been chewing well: yes Chewing/swallowing difficulties: no Changes in vision: no Changes to mood/headaches: no Hair loss/Cahnges to skin/Changes to nails: hair thinning Any difficulty focusing or concentrating: no Sweating: no Dizziness/Lightheaded:  Palpitations: no  Carbonated beverages: no N/V/D/C/GAS: constipated  Abdominal Pain: no Dumping syndrome: no  Recent physical activity:  Gym 45-60 minutes of cardio and some strength training   Progress Towards Goal(s):  In progress.  Handouts given during visit include:  Non-starchy veggies + protein    Nutritional Diagnosis:  [place]-3.3 Overweight/obesity related to past poor dietary habits and physical inactivity as evidenced by patient w/ recent sleeve surgery following dietary guidelines for  continued weight loss.    Intervention:  Nutrition counseling. Dietitian educated the pt on advancing her diet to include non-starchy veggies. Goals: -Try to take larger gulps of water -Stay aware of needing to eat and drink  -Try the multivitamin capsule with food on your stomach  -Aim to have vegetables with every lunch and every dinner ever day -Try the kefir in the yogurt aisle  Teaching Method Utilized:  Visual Auditory Hands on  Barriers to learning/adherence to lifestyle change: none identified   Demonstrated degree of understanding via:  Teach Back   Monitoring/Evaluation:  Dietary intake, exercise, and body weight.

## 2017-04-24 ENCOUNTER — Encounter: Payer: 59 | Admitting: Vascular Surgery

## 2017-04-26 ENCOUNTER — Encounter: Payer: Self-pay | Admitting: Certified Nurse Midwife

## 2017-04-26 ENCOUNTER — Ambulatory Visit (INDEPENDENT_AMBULATORY_CARE_PROVIDER_SITE_OTHER): Payer: 59 | Admitting: Certified Nurse Midwife

## 2017-04-26 ENCOUNTER — Telehealth: Payer: Self-pay | Admitting: Obstetrics and Gynecology

## 2017-04-26 ENCOUNTER — Other Ambulatory Visit: Payer: Self-pay

## 2017-04-26 VITALS — BP 122/78 | HR 84 | Resp 16 | Ht 68.0 in | Wt 261.0 lb

## 2017-04-26 DIAGNOSIS — K649 Unspecified hemorrhoids: Secondary | ICD-10-CM

## 2017-04-26 DIAGNOSIS — K625 Hemorrhage of anus and rectum: Secondary | ICD-10-CM | POA: Diagnosis not present

## 2017-04-26 MED ORDER — HYDROCORTISONE ACETATE 25 MG RE SUPP: RECTAL | 0 refills | Status: DC

## 2017-04-26 NOTE — Telephone Encounter (Signed)
Patient is requesting to speak with a nurse about possible hemorrhoids. She said she went to the bathroom and "the toilet was filled with blood."  Last seen: 03/27/17

## 2017-04-26 NOTE — Patient Instructions (Signed)
Hemorrhoids Hemorrhoids are swollen veins in and around the rectum or anus. There are two types of hemorrhoids:  Internal hemorrhoids. These occur in the veins that are just inside the rectum. They may poke through to the outside and become irritated and painful.  External hemorrhoids. These occur in the veins that are outside of the anus and can be felt as a painful swelling or hard lump near the anus.  Most hemorrhoids do not cause serious problems, and they can be managed with home treatments such as diet and lifestyle changes. If home treatments do not help your symptoms, procedures can be done to shrink or remove the hemorrhoids. What are the causes? This condition is caused by increased pressure in the anal area. This pressure may result from various things, including:  Constipation.  Straining to have a bowel movement.  Diarrhea.  Pregnancy.  Obesity.  Sitting for long periods of time.  Heavy lifting or other activity that causes you to strain.  Anal sex.  What are the signs or symptoms? Symptoms of this condition include:  Pain.  Anal itching or irritation.  Rectal bleeding.  Leakage of stool (feces).  Anal swelling.  One or more lumps around the anus.  How is this diagnosed? This condition can often be diagnosed through a visual exam. Other exams or tests may also be done, such as:  Examination of the rectal area with a gloved hand (digital rectal exam).  Examination of the anal canal using a small tube (anoscope).  A blood test, if you have lost a significant amount of blood.  A test to look inside the colon (sigmoidoscopy or colonoscopy).  How is this treated? This condition can usually be treated at home. However, various procedures may be done if dietary changes, lifestyle changes, and other home treatments do not help your symptoms. These procedures can help make the hemorrhoids smaller or remove them completely. Some of these procedures involve  surgery, and others do not. Common procedures include:  Rubber band ligation. Rubber bands are placed at the base of the hemorrhoids to cut off the blood supply to them.  Sclerotherapy. Medicine is injected into the hemorrhoids to shrink them.  Infrared coagulation. A type of light energy is used to get rid of the hemorrhoids.  Hemorrhoidectomy surgery. The hemorrhoids are surgically removed, and the veins that supply them are tied off.  Stapled hemorrhoidopexy surgery. A circular stapling device is used to remove the hemorrhoids and use staples to cut off the blood supply to them.  Follow these instructions at home: Eating and drinking  Eat foods that have a lot of fiber in them, such as whole grains, beans, nuts, fruits, and vegetables. Ask your health care provider about taking products that have added fiber (fiber supplements).  Drink enough fluid to keep your urine clear or pale yellow. Managing pain and swelling  Take warm sitz baths for 20 minutes, 3-4 times a day to ease pain and discomfort.  If directed, apply ice to the affected area. Using ice packs between sitz baths may be helpful. ? Put ice in a plastic bag. ? Place a towel between your skin and the bag. ? Leave the ice on for 20 minutes, 2-3 times a day. General instructions  Take over-the-counter and prescription medicines only as told by your health care provider.  Use medicated creams or suppositories as told.  Exercise regularly.  Go to the bathroom when you have the urge to have a bowel movement. Do not wait.    Avoid straining to have bowel movements.  Keep the anal area dry and clean. Use wet toilet paper or moist towelettes after a bowel movement.  Do not sit on the toilet for long periods of time. This increases blood pooling and pain. Contact a health care provider if:  You have increasing pain and swelling that are not controlled by treatment or medicine.  You have uncontrolled bleeding.  You  have difficulty having a bowel movement, or you are unable to have a bowel movement.  You have pain or inflammation outside the area of the hemorrhoids. This information is not intended to replace advice given to you by your health care provider. Make sure you discuss any questions you have with your health care provider. Document Released: 04/20/2000 Document Revised: 09/21/2015 Document Reviewed: 01/05/2015 Elsevier Interactive Patient Education  2018 Elsevier Inc.  

## 2017-04-26 NOTE — Progress Notes (Signed)
Subjective:     [name], female   DOB: 07-13-1980, 36 y.o.   MRN: [id number]  Patient here complaining of rectal pain after bowel movement and small amount of bleeding. Also history of hemorrhoids after son's birth. Has noted constipation with diet after sleeve surgery. Now on Miralax which has helped son. Has increased water and eating roughage that allowed. Denies tarry stools or abdominal pain or nausea or vomiting.     Review of Systems  Constitutional: Negative for activity change, appetite change and fever.  Gastrointestinal: Positive for anal bleeding, constipation and rectal pain. Negative for abdominal distention, abdominal pain and vomiting.  Skin: Negative.   Psychiatric/Behavioral: Negative.        Objective:   Physical Exam  Constitutional: She is oriented to person, place, and time. She appears well-developed and well-nourished.  Abdominal: Soft. Bowel sounds are normal. She exhibits no distension and no mass. There is no tenderness.  Abdomen soft, non tender, no masses. + bowel sounds all four quadrants  Genitourinary:     Neurological: She is alert and oriented to person, place, and time.  Skin: Skin is warm and dry.  Psychiatric: She has a normal mood and affect. Her behavior is normal. Judgment and thought content normal.       Assessment:     Internal hemorrhoid probably thrombosed prior to bleeding. No active bleeding noted. History of gastric sleeve surgery and constipation. Working with GI regarding diet and resolving.    Plan:     Discussed finding of internal hemorrhoid and etiology. Questions addressed. Discussed use of Colace stool softener bid x 5 days to help with hard stools and prevent further bleeding. Patient will check with GI prior to using. Discussed maintaining good water intake and no straining or prolonged standing that increases risk of hemorrhoid. Rx Hydrocortisone suppositories with instructions see order, will check  with GI prior to using. Printed Rx given to patient.  Rv prn

## 2017-04-26 NOTE — Telephone Encounter (Signed)
Spoke with patient. Patient states that she used the restroom today and the toilet was full of blood after having a bowel movement. Has a history of hemorrhoids in the past and feels this may be the cause. Appointment scheduled for today at 11:30 am with Melvia Heaps CNM. Patient is agreeable to date and time.  Routing to provider for final review. Patient agreeable to disposition. Will close encounter.

## 2017-05-08 ENCOUNTER — Ambulatory Visit: Payer: Self-pay | Admitting: Adult Health

## 2017-05-08 NOTE — Progress Notes (Deleted)
OV Notes from 02/22/2017: HPI: [name] [name] is a 37 y.o. female who presents to [place] at [facility] today for issues as discussed below.  Patient had gastric sleeve surgery on this Tuesday, just 3 days ago.  She was released from the hospital yesterday.  She developed a abdominal rash in the skin fold of her abdomen\lower pannus 1-2 days ago.  It was not seen by the surgical team upon discharge.  Patient was given several antibiotics perioperatively.  Patient has never had a yeast or skin infection in the past.  Patient called her surgeon to discuss this with them and was told to contact us.   02/27/2017 OV: [name] [name] is here for f/u after gastric sleeve surgery and to discuss whether or not she needs to continue with Metformin.  Her last A1c 01/09/2017 was 6.4. Her AM BS are running 90s and she is on only a liquid diet. She is walking 42mns every hr, 6 hrs a day and will increase to 152ms every hr for 6 hrs beginning tomorrow. She has f/u with surgeon next week. She denies CP/dyspnea/hypoglycemia. She reports rash has dramatically improved from last week. Of Note:  She had +blood pregnancy test that was eventually determined to be a false +, however she would like a urine pregnancy test today.  05/08/17 OV: [name] [name] here for f/u:  Patient Care Team    Relationship Specialty Notifications Start End  DaEsaw GrandchildNP PCP - General Family Medicine  10/18/16     Patient Active Problem List   Diagnosis Date Noted  . Dehydration 03/12/2017  . 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 Sleep apnea 01/09/2017  . Morbid obesity with BMI of 45.0-49.9, adult ([place]09/09/2016  . Healthcare maintenance 10/18/2016  . Type 2 diabetes mellitus without complications ([id number]. LOW BACK PAIN, ACUTE 11/20/2006  . POLYCYSTIC OVARY 07/04/2006  . OBESITY, NOS 07/04/2006  . TOBACCO USE, QUIT 07/04/2006     Past Medical History:  Diagnosis Date  .  Abnormal uterine bleeding   . Amenorrhea   . Anemia   . Anxiety    panic attacks  . Diabetes mellitus without complication ([place]   type 2  . Dysmenorrhea   . GERD (gastroesophageal reflux disease)    occasional - diet controlled and tums prn  . Hyperlipidemia    no meds -diet controlled  . Infertility, female   . Insulin resistance   . Migraine without aura   . Missed abortion    x 2 - both resolved without surgery  . Obesity   . PCOS (polycystic ovarian syndrome)   . PCOS (polycystic ovarian syndrome)   . PONV (postoperative nausea and vomiting)    pe rpatient ; scopalamine patch very helpful   . Sleep apnea   . SVD (spontaneous vaginal delivery)    x 1  . Urinary incontinence      Past Surgical History:  Procedure Laterality Date  . CHOLECYSTECTOMY    . COLONOSCOPY  2017   polyps  . CYSTOSCOPY N/A 10/29/2016   Procedure: CYSTOSCOPY;  Surgeon: JeSalvadore DomMD;  [facility];  Service: Gynecology;  Laterality: N/A;  . DILATATION & CURRETTAGE/HYSTEROSCOPY WITH RESECTOCOPE N/A 10/17/2012   Procedure: DILATATION & CURETTAGE/HYSTEROSCOPY WITH RESECTOCOPE; Possible Polypectomy, Possible Resectoscopic Myomectomy.;  Surgeon: KeFloyce StakesFoPamala HurryMD;  [facility];  Service: Gynecology;  Laterality: N/A;  1 hr.  . Marland KitchenILATION AND CURETTAGE OF UTERUS    .  ENDOMETRIAL BIOPSY    . ESOPHAGOGASTRODUODENOSCOPY ENDOSCOPY  2017   Normal  . HYSTEROSCOPY W/D&C N/A 12/01/2013   Procedure: IUD Removal;  Surgeon: Claiborne Billings A. Pamala Hurry, MD;  Location: [facility];  Service: Gynecology;  Laterality: N/A;  90 min.  . INTRAUTERINE DEVICE (IUD) INSERTION N/A 10/17/2012   Procedure: INTRAUTERINE DEVICE (IUD) INSERTION; Mirena;  Surgeon: Floyce Stakes. Pamala Hurry, MD;  Location: [facility];  Service: Gynecology;  Laterality: N/A;  . LAPAROSCOPIC GASTRIC SLEEVE RESECTION N/A 02/19/2017   Procedure: LAPAROSCOPIC GASTRIC SLEEVE RESECTION, UPPER ENDOSCOPY;  Surgeon: Johnathan Hausen, MD;  Location: [facility];   Service: General;  Laterality: N/A;  . LAPAROSCOPIC SALPINGO OOPHERECTOMY Left 10/29/2016   Procedure: LAPAROSCOPIC SALPINGO OOPHORECTOMY With anterior uterine biopsy;  Surgeon: Salvadore Dom, MD;  Location: [facility];  Service: Gynecology;  Laterality: Left;  . MOUTH SURGERY     wisdom teeth     Family History  Problem Relation Age of Onset  . Diabetes Father   . Thyroid disease Father   . Hypertension Father   . Diabetes Maternal Grandmother   . Heart disease Maternal Grandmother   . Diabetes Maternal Grandfather   . Heart disease Maternal Grandfather   . Stroke Maternal Grandfather   . Diabetes Paternal Grandmother   . Diabetes Paternal Grandfather   . Heart disease Paternal Grandfather   . Alcohol abuse Paternal Grandfather   . Diabetes Other   . Obesity Other   . Sleep apnea Other   . Hyperlipidemia Other   . Hypertension Other   . Cancer Other   . Heart disease Other   . Hypertension Mother      Social History   Substance and Sexual Activity  Drug Use No     Social History   Substance and Sexual Activity  Alcohol Use Yes   Comment: occasional     Social History   Tobacco Use  Smoking Status Former Smoker  . Packs/day: 0.25  . Years: 19.00  . Pack years: 4.75  . Types: Cigarettes  . Last attempt to quit: 06/06/2016  . Years since quitting: 0.9  Smokeless Tobacco Never Used     Outpatient Encounter Medications as of 05/08/2017  Medication Sig Note  . blood glucose meter kit and supplies KIT Dispense based on patient and insurance preference. Use up to four times daily as directed. (FOR ICD-9 250.00, 250.01).   . diazepam (VALIUM) 5 MG tablet Take 1 tablet (5 mg total) by mouth every 8 (eight) hours as needed for anxiety.   . hydrocortisone (ANUSOL-HC) 25 MG suppository Insert rectally twice daily for 5 days   . levonorgestrel (MIRENA) 20 MCG/24HR IUD 1 each by Intrauterine route once. 02/18/2017: On hold   . Multiple Vitamins-Iron (MULTIVITAMINS  WITH IRON) TABS tablet Take 1 tablet by mouth 2 (two) times daily. CHEWABLE   . nystatin (MYCOSTATIN/NYSTOP) powder Apply topically 4 (four) times daily.   . ondansetron (ZOFRAN) 4 MG tablet as needed.   Marland Kitchen OVER THE COUNTER MEDICATION Take 1 tablet by mouth 3 (three) times daily. Bariatric calcium '500mg'$    . pantoprazole (PROTONIX) 40 MG tablet    . Probiotic Product (DIGESTIVE ADVANTAGE GUMMIES) CHEW Chew 1 each by mouth 3 (three) times daily.     No facility-administered encounter medications on file as of 05/08/2017.     Allergies: Iohexol  There is no height or weight on file to calculate BMI.  There were no vitals taken for this visit.   Wt Readings from Last 3 Encounters:  04/26/17 261 lb (118.4 kg)  04/17/17 252 lb 9.6 oz (114.6 kg)  03/27/17 261 lb (118.4 kg)   BP Readings from Last 3 Encounters:  04/26/17 122/78  03/27/17 128/80  03/12/17 110/74   BMI Readings from Last 3 Encounters:  04/26/17 39.68 kg/m  04/17/17 38.41 kg/m  03/27/17 39.68 kg/m     Patient Care Team    Relationship Specialty Notifications Start End  Esaw Grandchild, NP PCP - General Family Medicine  10/18/16      Patient Active Problem List   Diagnosis Date Noted  . Dehydration 03/12/2017  . 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 Sleep apnea 01/09/2017  . Morbid obesity with BMI of 45.0-49.9, adult ([place]) 01/09/2017  . Healthcare maintenance 10/18/2016  . Type 2 diabetes mellitus without complications ([place]) 25/83/4621  . LOW BACK PAIN, ACUTE 11/20/2006  . POLYCYSTIC OVARY 07/04/2006  . OBESITY, NOS 07/04/2006  . TOBACCO USE, QUIT 07/04/2006    Past Medical history, Surgical history, Family history, Social history, Allergies and Medications have been entered into the medical record, reviewed and changed as needed.    No outpatient medications have been marked as taking for the 05/08/17 encounter (Appointment) with Esaw Grandchild, NP.    Allergies:   lohexol    Review of Systems: General:   Denies fever, chills, unexplained weight loss.  Optho/Auditory:   Denies visual changes, blurred vision/LOV Respiratory:   Denies wheeze, DOE more than baseline levels.  Cardiovascular:   Denies chest pain, palpitations, new onset peripheral edema  Gastrointestinal:   Denies nausea, vomiting, diarrhea, abd pain.  Genitourinary: Denies dysuria, freq/ urgency, flank pain or discharge from genitals.  Endocrine:     Denies hot or cold intolerance, polyuria, polydipsia. Musculoskeletal:   Denies unexplained myalgias, joint swelling, unexplained arthralgias, gait problems.  Skin:  Denies new onset rash, suspicious lesions Neurological:     Denies dizziness, unexplained weakness, numbness  Psychiatric/Behavioral:   Denies mood changes, suicidal or homicidal ideations, hallucinations    Objective:   There were no vitals taken for this visit. There is no height or weight on file to calculate BMI. General:  Well Developed, well nourished, appropriate for stated age.  Neuro:  Alert and oriented,  extra-ocular muscles intact  HEENT:  Normocephalic, atraumatic, neck supple Skin: rash on pannus sig reduced from last week Cardiac:  RRR, S1 S2 Respiratory:  ECTA B/L and A/P, Not using accessory muscles, speaking in full sentences- unlabored. Vascular:  Ext warm, no cyanosis apprec.; cap RF less 2 sec. Psych:  No HI/SI, judgement and insight good, Euthymic mood. Full Affect.  No diagnosis found.  No problem-specific Assessment & Plan notes found for this encounter.    FOLLOW-UP:  No Follow-up on file.

## 2017-05-15 ENCOUNTER — Encounter: Payer: Self-pay | Admitting: Family Medicine

## 2017-05-15 ENCOUNTER — Ambulatory Visit (INDEPENDENT_AMBULATORY_CARE_PROVIDER_SITE_OTHER): Payer: No Typology Code available for payment source | Admitting: Family Medicine

## 2017-05-15 VITALS — BP 134/90 | HR 62 | Temp 98.2°F | Ht 68.0 in | Wt 244.0 lb

## 2017-05-15 DIAGNOSIS — R059 Cough, unspecified: Secondary | ICD-10-CM

## 2017-05-15 DIAGNOSIS — J329 Chronic sinusitis, unspecified: Secondary | ICD-10-CM | POA: Diagnosis not present

## 2017-05-15 DIAGNOSIS — J310 Chronic rhinitis: Secondary | ICD-10-CM

## 2017-05-15 DIAGNOSIS — R05 Cough: Secondary | ICD-10-CM | POA: Diagnosis not present

## 2017-05-15 DIAGNOSIS — J069 Acute upper respiratory infection, unspecified: Secondary | ICD-10-CM

## 2017-05-15 DIAGNOSIS — B9789 Other viral agents as the cause of diseases classified elsewhere: Secondary | ICD-10-CM | POA: Diagnosis not present

## 2017-05-15 MED ORDER — HYDROCOD POLST-CPM POLST ER 10-8 MG/5ML PO SUER: 5.0000 mL | Freq: Two times a day (BID) | ORAL | 0 refills | Status: DC | PRN

## 2017-05-15 MED ORDER — PREDNISONE 20 MG PO TABS: ORAL_TABLET | ORAL | 0 refills | Status: DC

## 2017-05-15 MED FILL — predniSONE 20 MG TABS: 20 | 8 days supply | Qty: 14 | Fill #0

## 2017-05-15 NOTE — Patient Instructions (Addendum)
-Take the prednisone today and every morning as prescribed.  Use your cough medicine as needed at night and if it does not cause you to feel too drowsy can use it during the day.  You can return to work as long as you are fever free and on nothing to bring down your fever for 24 hours.  If after 7 days or next Monday, you are not starting to turn the corner or you are getting worse by 7-10 days please call us as we will call in a prescription for amoxicillin for the symptoms.  If symptoms have changed please contact us as you will more than likely need to be reevaluated.    You most likely have a viral infection that should resolve on its own over time (or this could be a flare of seasonal allergies as well).   Symptoms for a viral upper respiratory tract infection usually last 3-7 days but can stretch out to 2-3 weeks before you're feeling back to normal.  Your symptoms should not worsen after 7-10 days and if they truely do, please notify our office, as you may need antibiotics.  You can use over-the-counter afrin nasal spray for up to 3 days (NO longer than that) which will help acutely with nasal drainage/ congestion short term.   Also, sterile saline nasal rinses, such as Milta Deiters med or AYR sinus rinses, can be very helpful and should be done twice daily- anytime you are sick and especially throughout the allergy season.   Remember you should use distilled water or previously boiled water to do this.   You can also use an over the counter cold and flu medication such as Tylenol Severe Cold and Sinus/Flu or Dayquil, Nyquil and the like, which will help with cough, congestion, headache/ pain, fevers/chills etc.  Please note, if you being treated for hypertension or have high blood pressure, you should be using the cold meds designated "HBP".    Unfortunately, antibiotics are not helpful for viral infections.  Wash your hands frequently, as you did not want to get those around you sick as well. Never  sneeze or cough on others.  And you should not be going to school or work if you are running a temperature of 100.5 or more on two separate occasions.   Drink plenty of fluids and stay hydrated, especially if you are running fevers.  We don't know why, but chicken soup also helps, try it! :)    Upper respiratory viral infection, Adult Adenoviruses are common viruses that cause many different types of infections. The viruses usually affect the lungs, but they can also affect other parts of the body, including the eyes, stomach, bowels, bladder, and brain. The most common type of adenovirus infection is the common cold. Usually, adenovirus infections are not severe unless you have another health problem that makes it hard for your body to fight off infection. What are the causes? You can get this condition if you:  Touch a surface or object that has an adenovirus on it and then touch your mouth, nose, or eyes with unwashed hands.  Come into close physical contact with an infected person, such as by hugging or shaking hands.  Breathe in droplets that fly through the air when an infected person talks, coughs, or sneezes.  Have contact with infected stool.  Swim in a pool that does not have enough chlorine.  Adenoviruses can live outside the body for many weeks. They spread easily from person to person (  are contagious). What increases the risk? This condition is more likely to develop in:  People who spend a lot of time in places where there are many people, such as schools, summer camps, daycare centers, community centers, and [employer] recruit training centers.  Elderly adults.  People with a weak body defense system (immune system).  People with a lung disease.  People with a heart condition.  What are the signs or symptoms? Adenovirus infections usually cause flu-like symptoms. Once the virus gets into the body, symptoms of this condition can take up to 14 days to develop.  Symptoms may include:  Headache.  Stiff neck.  Sleepiness or fatigue.  Confusion or disorientation.  Fever.  Sore throat.  Cough.  Trouble breathing.  Runny nose or congestion.  Pink eye (conjunctivitis).  Bleeding into the covering of the eye.  Stomachache or diarrhea.  Nausea or vomiting.  Blood in the urine or pain while urinating.  Ear pain or fullness.  How is this diagnosed? This condition may be diagnosed based on your symptoms and a physical exam. Your health care provider may order tests to make sure your symptoms are not caused by another type of problem. Tests can include:  Blood tests.  Urine tests.  Stool tests.  Chest X-ray.  Tissue or throat culture.  How is this treated? This condition goes away on its own with time. Treatment for this condition involves managing symptoms until the condition goes away. Your health care provider may recommend:  Rest.  Drinking more fluids.  Taking over-the-counter medicine to help relieve a sore throat, fever, or headache.  Follow these instructions at home:  Rest at home until your symptoms go away.  Drink enough fluid to keep your urine clear or pale yellow.  Take over-the-counter and prescription medicines only as told by your health care provider.  Keep all follow-up visits as told by your health care provider. This is important. How is this prevented? Adenoviruses are resistant to many cleaning products and can remain on surfaces for long periods of time. To help prevent infection:  Wash your hands often with soap and water.  Cover your nose or mouth when you sneeze or cough.  Do not touch your eyes, nose, or mouth with unwashed hands.  Clean commonly used objects often.  Do not swim in a pool that is not properly chlorinated.  Avoid close contact with people who are sick.  Do not go to school or work when you are sick.  Contact a health care provider if:  Your symptoms do not  improve after 10 days.  Your symptoms get worse.  You cannot eat or drink without vomiting. Get help right away if:  You have trouble breathing or you are breathing rapidly.  Your skin, lips, or fingernails look blue (cyanosis).  You have a rapid heart rate.  You become confused.  You lose consciousness. This information is not intended to replace advice given to you by your health care provider. Make sure you discuss any questions you have with your health care provider. Document Released: 07/14/2002 Document Revised: 12/19/2015 Document Reviewed: 12/19/2015 Elsevier Interactive Patient Education  [employer].

## 2017-05-15 NOTE — Progress Notes (Signed)
Pt here for an acute care OV today   Impression and Recommendations:    1. Viral URI with cough   2. Rhinosinusitis   3. Cough      - We reviewed the importance of not administering antibiotics for a viral infection. If her symptoms continue 7-10 days without improvement and W, we will consider Amoxicillin.  She should continue taking tylenol/advil/muscinex cold and sinus.  - For sinus relief, advised the patient to begin using AYR or Neilmed sinus rinses BID followed by flonase/nasal steroid BID (one spray to each nostril).  - Prednisone was discussed as another option for helping resolve her symptoms. Patient has used this in the past and would like to use it again.  - Prednisone taper prescribed for symptom relief. - Tussionex prescribed for cough.  - If she feels worse at any time, or has fevers or chills, the patient knows to call in to start antibiotics.  - The patient should stay off of work today and may return tomorrow if no fever.  Meds ordered this encounter  Medications  . predniSONE (DELTASONE) 20 MG tablet    Sig: Take 3 pills a day for 2 days, 2 pills a day for 2 days, 1 pill a day for 2 days then one half pill a day for 2 days then off    Dispense:  14 tablet    Refill:  0  . chlorpheniramine-HYDROcodone (TUSSIONEX) 10-8 MG/5ML SUER    Sig: Take 5 mLs by mouth every 12 (twelve) hours as needed for cough (cough, will cause drowsiness.).    Dispense:  120 mL    Refill:  0    No orders of the defined types were placed in this encounter.    Education and routine counseling performed. Handouts provided  Gross side effects, risk and benefits, and alternatives of medications and treatment plan in general discussed with patient.  Patient is aware that all medications have potential side effects and we are unable to predict every side effect or drug-drug interaction that may occur.   Patient will call with any questions prior to using medication if they have  concerns.  Expresses verbal understanding and consents to current therapy and treatment regimen.  No barriers to understanding were identified.  Red flag symptoms and signs discussed in detail.  Patient expressed understanding regarding what to do in case of emergency\urgent symptoms   Please see AVS handed out to patient at the end of our visit for further patient instructions/ counseling done pertaining to today's office visit.   Return if symptoms worsen or fail to improve.     Note: This document was prepared occasionally using Dragon voice recognition software and may include unintentional dictation errors in addition to a scribe.     This document serves as a record of services personally performed by Mellody Dance, DO. It was created on her behalf by Toni Amend, a trained medical scribe. The creation of this record is based on the scribe's personal observations and the provider's statements to them.   I have reviewed the above medical documentation for accuracy and completeness and I concur.  Mellody Dance 05/15/17 10:05 AM   --------------------------------------------------------------------------------------------------------------------------------------------------------------------------------------------------------------------------------------------    Subjective:    CC:  Chief Complaint  Patient presents with  . Sinus Problem    nasal congestion, sinus pain and pressure, non productive cough x 2 days    HPI: [name] [name] is a 37 y.o. female who presents to [facility] today for issues as discussed below.  Patient believes she has a sinus infection.  She notes that her symptoms began 2 days ago with headache and pressure. This pressure is now going bilaterally across her frontal sinuses and down to her maxillary sinus. She describes having a raw feeling in her throat.  Patient is coughing, but not producing anything  when she coughs. Her cough is currently keeping her up at night.  When she blows her nose, mucus started out clear, but she reports it is now sometimes yellow and sometimes green. She has felt warm to the touch, but has not measured a fever with her thermometer. Her temperature is running 97 to 98.7.  She has taken tylenol cold and sinus for this so far, but nothing else.  Overall, she feels that her facial pain has gotten worse, but her symptoms are about the same.  She denies ear pain, but feels her wax buildup has been more the past few days.   She notes that she gets sinus problems a lot.  No problems updated.    Wt Readings from Last 3 Encounters:  05/15/17 244 lb (110.7 kg)  04/26/17 261 lb (118.4 kg)  04/17/17 252 lb 9.6 oz (114.6 kg)   BP Readings from Last 3 Encounters:  05/15/17 134/90  04/26/17 122/78  03/27/17 128/80   BMI Readings from Last 3 Encounters:  05/15/17 37.10 kg/m  04/26/17 39.68 kg/m  04/17/17 38.41 kg/m     Patient Care Team    Relationship Specialty Notifications Start End  Esaw Grandchild, NP PCP - General Family Medicine  10/18/16      Patient Active Problem List   Diagnosis Date Noted  . Dehydration 03/12/2017  . 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 Sleep apnea 01/09/2017  . Morbid obesity with BMI of 45.0-49.9, adult ([place]) 01/09/2017  . Healthcare maintenance 10/18/2016  . Type 2 diabetes mellitus without complications ([place]) 76/72/0947  . LOW BACK PAIN, ACUTE 11/20/2006  . POLYCYSTIC OVARY 07/04/2006  . OBESITY, NOS 07/04/2006  . TOBACCO USE, QUIT 07/04/2006    Past Medical history, Surgical history, Family history, Social history, Allergies and Medications have been entered into the medical record, reviewed and changed as needed.    Current Meds  Medication Sig  . blood glucose meter kit and supplies KIT Dispense based on patient and insurance preference. Use up to four times daily as directed. (FOR ICD-9  250.00, 250.01).  . diazepam (VALIUM) 5 MG tablet Take 1 tablet (5 mg total) by mouth every 8 (eight) hours as needed for anxiety.  . hydrocortisone (ANUSOL-HC) 25 MG suppository Insert rectally twice daily for 5 days  . levonorgestrel (MIRENA) 20 MCG/24HR IUD 1 each by Intrauterine route once.  . Multiple Vitamins-Iron (MULTIVITAMINS WITH IRON) TABS tablet Take 1 tablet by mouth 2 (two) times daily. CHEWABLE  . nystatin (MYCOSTATIN/NYSTOP) powder Apply topically 4 (four) times daily.  . ondansetron (ZOFRAN) 4 MG tablet as needed.  Marland Kitchen OVER THE COUNTER MEDICATION Take 1 tablet by mouth 3 (three) times daily. Bariatric calcium 563m  . pantoprazole (PROTONIX) 40 MG tablet   . Probiotic Product (DIGESTIVE ADVANTAGE GUMMIES) CHEW Chew 1 each by mouth 3 (three) times daily.     Allergies:  - see list   Review of Systems: General:   Denies fever, chills, unexplained weight loss.  Optho/Auditory:   Denies visual changes, blurred vision/LOV Respiratory:   Denies wheezes. DOE more than baseline levels.  Cardiovascular:  Denies chest pain, palpitations, new onset peripheral edema  Gastrointestinal:   Denies nausea, vomiting, diarrhea, abd pain.  Genitourinary: Denies dysuria, freq/ urgency, flank pain or discharge from genitals.  Endocrine:     Denies hot or cold intolerance, polyuria, polydipsia. Musculoskeletal:   Denies unexplained myalgias, joint swelling, unexplained arthralgias, gait problems.  Skin:  Denies new onset rash, suspicious lesions Neurological:     Denies dizziness, unexplained weakness, numbness  Psychiatric/Behavioral:   Denies mood changes, suicidal or homicidal ideations, hallucinations HEENT: +cough, +sinus pressure, +sore throat, +headache, +congestion, +discharge, +ear pressure.  . Objective:   Blood pressure 134/90, pulse 62, temperature 98.2 F (36.8 C), height 5' 8" (1.727 m), weight 244 lb (110.7 kg), SpO2 98 %. Body mass index is 37.1 kg/m. General:  Well  Developed, well nourished, appropriate for stated age.  Neuro:  Alert and oriented,  extra-ocular muscles intact  HEENT:  Tender to palpation on left frontal and left maxillary sinus. Edematous, erythematous nares with clear drainage. Tenderness to palpation of anterior cervical lymph nodes. Oral exam is normal. Opacity of TM bilaterally with slight retraction.No air fluid level. Normocephalic, atraumatic, neck supple Skin:  no gross rash, warm, pink. Cardiac:  RRR, S1 S2 Respiratory:  ECTA B/L and A/P, Not using accessory muscles, speaking in full sentences- unlabored. Vascular:  Ext warm, no cyanosis apprec.; cap RF less 2 sec. Psych:  No HI/SI, judgement and insight good, Euthymic mood. Full Affect.

## 2017-05-17 MED FILL — HYDROCODONE-CHLORPHENIRAM S: 10-8 | 12 days supply | Qty: 120 | Fill #0

## 2017-05-30 ENCOUNTER — Encounter: Payer: Self-pay | Admitting: Obstetrics and Gynecology

## 2017-05-30 ENCOUNTER — Other Ambulatory Visit: Payer: Self-pay

## 2017-05-30 ENCOUNTER — Ambulatory Visit (INDEPENDENT_AMBULATORY_CARE_PROVIDER_SITE_OTHER): Payer: No Typology Code available for payment source | Admitting: Obstetrics and Gynecology

## 2017-05-30 VITALS — BP 110/70 | HR 84 | Resp 16 | Ht 67.5 in | Wt 245.0 lb

## 2017-05-30 DIAGNOSIS — F418 Other specified anxiety disorders: Secondary | ICD-10-CM

## 2017-05-30 DIAGNOSIS — R300 Dysuria: Secondary | ICD-10-CM

## 2017-05-30 DIAGNOSIS — E559 Vitamin D deficiency, unspecified: Secondary | ICD-10-CM | POA: Diagnosis not present

## 2017-05-30 DIAGNOSIS — L659 Nonscarring hair loss, unspecified: Secondary | ICD-10-CM

## 2017-05-30 DIAGNOSIS — N941 Unspecified dyspareunia: Secondary | ICD-10-CM

## 2017-05-30 DIAGNOSIS — Z8639 Personal history of other endocrine, nutritional and metabolic disease: Secondary | ICD-10-CM | POA: Diagnosis not present

## 2017-05-30 DIAGNOSIS — Z30431 Encounter for routine checking of intrauterine contraceptive device: Secondary | ICD-10-CM | POA: Diagnosis not present

## 2017-05-30 DIAGNOSIS — R5383 Other fatigue: Secondary | ICD-10-CM

## 2017-05-30 DIAGNOSIS — Z0000 Encounter for general adult medical examination without abnormal findings: Secondary | ICD-10-CM | POA: Diagnosis not present

## 2017-05-30 DIAGNOSIS — Z01419 Encounter for gynecological examination (general) (routine) without abnormal findings: Secondary | ICD-10-CM | POA: Diagnosis not present

## 2017-05-30 DIAGNOSIS — M6289 Other specified disorders of muscle: Secondary | ICD-10-CM

## 2017-05-30 LAB — POCT URINALYSIS DIPSTICK
BILIRUBIN UA: NEGATIVE
Blood, UA: NEGATIVE
Glucose, UA: NEGATIVE
KETONES UA: NEGATIVE
Nitrite, UA: NEGATIVE
Protein, UA: NEGATIVE
Urobilinogen, UA: NEGATIVE E.U./dL — AB
pH, UA: 6 (ref 5.0–8.0)

## 2017-05-30 MED ORDER — SULFAMETHOXAZOLE-TRIMETHOPRIM 800-160 MG PO TABS: 1.0000 | ORAL_TABLET | Freq: Two times a day (BID) | ORAL | 0 refills | Status: DC

## 2017-05-30 MED ORDER — NYSTATIN 100000 UNIT/GM EX CREA: 1.0000 "application " | TOPICAL_CREAM | Freq: Two times a day (BID) | CUTANEOUS | 2 refills | Status: DC

## 2017-05-30 MED ORDER — PHENAZOPYRIDINE HCL 200 MG PO TABS: 200.0000 mg | ORAL_TABLET | Freq: Three times a day (TID) | ORAL | 0 refills | Status: DC | PRN

## 2017-05-30 MED ORDER — CITALOPRAM HYDROBROMIDE 20 MG PO TABS: ORAL_TABLET | ORAL | 1 refills | Status: DC

## 2017-05-30 MED FILL — SULFAMETHOXAZOLE-TMP DS TAB: 800-160 | 3 days supply | Qty: 6 | Fill #0

## 2017-05-30 MED FILL — PHENAZOPYRIDINE 200 MG TAB: 200 | 2 days supply | Qty: 6 | Fill #0

## 2017-05-30 MED FILL — CITALOPRAM HBR 20 MG TABLET: 20 | 30 days supply | Qty: 30 | Fill #0

## 2017-05-30 MED FILL — NYSTATIN 100,000 UNIT/GM CR: 100000 | 10 days supply | Qty: 30 | Fill #0

## 2017-05-30 NOTE — Progress Notes (Signed)
37 y.o. [id number] MarriedCaucasianF here for annual exam.  She has a mirena IUD, inserted in 12/17.  She is s/p laparoscopic LOA, LSO and chromopertubation in 6/18. 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 She has lost 70 lbs from her highest weight, down 45 lb since surgery. Never hungry.  Happy with her IUD, some spotting, can last several days. No regular cycles. Occasional cramps prior to or with spotting. Can be moody prior to the spotting.  She is leaving work, has some depression, but worse anxiety. No thoughts of hurting herself or others. She is going to counseling 2 x a week and marriage counseling 1 x week. Both trying. She is going to take time off.  She is still suffering from the loss of her Grandmother, marital issues. The gastric surgery was hard for her.  Sexually active, she is having dyspareunia. Feels vaginal pain, like her vagina is clamping down. He can't enter all the way. No pain at the entry of her vagina. Occasional deep pain. Not using a lubricant.  Feeling fatigue, hair is falling out.  She is having some irritation under her panus, under her breasts and in the crease of her legs. She was on Celexa in the past for depression, it helped, went off, now bad again.  She c/o mild dysuria for the last 4-5 days, urine odor is worse.     No LMP recorded. Patient is not currently having periods (Reason: IUD).          Sexually active: Yes.    The current method of family planning is IUD.    Exercising: Yes.    walking/ weights Smoker:  Former smoker  Health Maintenance: Pap:  03-14-16 WNL NEG HR HPV History of abnormal Pap:  Yes- 2004- repeat PAP normal  TDaP:  11-18-13 Gardasil: No   reports that she quit smoking about a year ago. Her smoking use included cigarettes. She has a 4.75 pack-year smoking history. she has never used smokeless tobacco. She reports that she does not drink alcohol or use drugs.  Past Medical History:   Diagnosis Date  . Abnormal uterine bleeding   . Amenorrhea   . Anemia   . Anxiety    panic attacks  . Diabetes mellitus without complication ([place])    type 2  . Dysmenorrhea   . GERD (gastroesophageal reflux disease)    occasional - diet controlled and tums prn  . Hyperlipidemia    no meds -diet controlled  . Infertility, female   . Insulin resistance   . Migraine without aura   . Missed abortion    x 2 - both resolved without surgery  . Obesity   . PCOS (polycystic ovarian syndrome)   . PCOS (polycystic ovarian syndrome)   . PONV (postoperative nausea and vomiting)    pe rpatient ; scopalamine patch very helpful   . Sleep apnea   . SVD (spontaneous vaginal delivery)    x 1  . Urinary incontinence     Past Surgical History:  Procedure Laterality Date  . CHOLECYSTECTOMY    . COLONOSCOPY  2017   polyps  . CYSTOSCOPY N/A 10/29/2016   Procedure: CYSTOSCOPY;  Surgeon: Salvadore Dom, MD;  Location: [facility];  Service: Gynecology;  Laterality: N/A;  . DILATATION & CURRETTAGE/HYSTEROSCOPY WITH RESECTOCOPE N/A 10/17/2012   Procedure: DILATATION & CURETTAGE/HYSTEROSCOPY WITH RESECTOCOPE; Possible Polypectomy, Possible Resectoscopic Myomectomy.;  Surgeon: Floyce Stakes. Pamala Hurry, MD;  Location: [facility];  Service: Gynecology;  Laterality:  N/A;  1 hr.  Marland Kitchen DILATION AND CURETTAGE OF UTERUS    . ENDOMETRIAL BIOPSY    . ESOPHAGOGASTRODUODENOSCOPY ENDOSCOPY  2017   Normal  . HYSTEROSCOPY W/D&C N/A 12/01/2013   Procedure: IUD Removal;  Surgeon: Claiborne Billings A. Pamala Hurry, MD;  Location: [facility];  Service: Gynecology;  Laterality: N/A;  90 min.  . INTRAUTERINE DEVICE (IUD) INSERTION N/A 10/17/2012   Procedure: INTRAUTERINE DEVICE (IUD) INSERTION; Mirena;  Surgeon: Floyce Stakes. Pamala Hurry, MD;  Location: [facility];  Service: Gynecology;  Laterality: N/A;  . LAPAROSCOPIC GASTRIC SLEEVE RESECTION N/A 02/19/2017   Procedure: LAPAROSCOPIC GASTRIC SLEEVE RESECTION, UPPER ENDOSCOPY;  Surgeon: Johnathan Hausen, MD;   Location: [facility];  Service: General;  Laterality: N/A;  . LAPAROSCOPIC SALPINGO OOPHERECTOMY Left 10/29/2016   Procedure: LAPAROSCOPIC SALPINGO OOPHORECTOMY With anterior uterine biopsy;  Surgeon: Salvadore Dom, MD;  Location: [facility];  Service: Gynecology;  Laterality: Left;  . MOUTH SURGERY     wisdom teeth    Current Outpatient Medications  Medication Sig Dispense Refill  . blood glucose meter kit and supplies KIT Dispense based on patient and insurance preference. Use up to four times daily as directed. (FOR ICD-9 250.00, 250.01). 1 each 0  . diazepam (VALIUM) 5 MG tablet Take 1 tablet (5 mg total) by mouth every 8 (eight) hours as needed for anxiety. 60 tablet 0  . hydrocortisone (ANUSOL-HC) 25 MG suppository Insert rectally twice daily for 5 days 12 suppository 0  . levonorgestrel (MIRENA) 20 MCG/24HR IUD 1 each by Intrauterine route once.    . Multiple Vitamins-Iron (MULTIVITAMINS WITH IRON) TABS tablet Take 1 tablet by mouth 2 (two) times daily. CHEWABLE    . nystatin (MYCOSTATIN/NYSTOP) powder Apply topically 4 (four) times daily. 60 g 0  . ondansetron (ZOFRAN) 4 MG tablet as needed.  0  . OVER THE COUNTER MEDICATION Take 1 tablet by mouth 3 (three) times daily. Bariatric calcium '500mg'$     . pantoprazole (PROTONIX) 40 MG tablet   3  . Probiotic Product (DIGESTIVE ADVANTAGE GUMMIES) CHEW Chew 1 each by mouth 3 (three) times daily.      No current facility-administered medications for this visit.   Diabetes is diet controlled   Family History  Problem Relation Age of Onset  . Diabetes Father   . Thyroid disease Father   . Hypertension Father   . Diabetes Maternal Grandmother   . Heart disease Maternal Grandmother   . Diabetes Maternal Grandfather   . Heart disease Maternal Grandfather   . Stroke Maternal Grandfather   . Diabetes Paternal Grandmother   . Diabetes Paternal Grandfather   . Heart disease Paternal Grandfather   . Alcohol abuse Paternal Grandfather   .  Diabetes Other   . Obesity Other   . Sleep apnea Other   . Hyperlipidemia Other   . Hypertension Other   . Cancer Other   . Heart disease Other   . Hypertension Mother   MGM died in 2022/11/10 of dementia, HTN, elevated lipids  Review of Systems  Constitutional: Negative.   HENT: Positive for sinus pain.   Eyes: Negative.   Respiratory: Negative.   Cardiovascular: Negative.   Gastrointestinal: Positive for constipation.  Endocrine: Negative.   Genitourinary: Positive for dyspareunia and dysuria.       Vaginal itching  Musculoskeletal: Negative.   Skin: Positive for rash.       Hair loss  Allergic/Immunologic: Negative.   Neurological: Positive for headaches.  Psychiatric/Behavioral: The patient is nervous/anxious.   Issues  with constipation since her surgery, has intermittently had bleeding with BM and pain  Exam:   BP 110/70 (BP Location: Right Arm, Patient Position: Sitting, Cuff Size: Normal)   Pulse 84   Resp 16   Ht 5' 7.5" (1.715 m)   Wt 245 lb (111.1 kg)   BMI 37.81 kg/m   Weight change: '@WEIGHTCHANGE'$ @ Height:   Height: 5' 7.5" (171.5 cm) Down from 303 in 11/17.  Ht Readings from Last 3 Encounters:  05/30/17 5' 7.5" (1.715 m)  05/15/17 '5\' 8"'$  (1.727 m)  04/26/17 '5\' 8"'$  (1.727 m)    General appearance: alert, cooperative and appears stated age Head: Normocephalic, without obvious abnormality, atraumatic Neck: no adenopathy, supple, symmetrical, trachea midline and thyroid normal to inspection and palpation Lungs: clear to auscultation bilaterally Cardiovascular: regular rate and rhythm Breasts: normal appearance, no masses or tenderness Abdomen: soft, non-tender; non distended,  no masses,  no organomegaly Extremities: extremities normal, atraumatic, no cyanosis or edema Skin: Skin color, texture, turgor normal. Mild erythematous rash under her breasts and panus Lymph nodes: Cervical, supraclavicular, and axillary nodes normal. No abnormal inguinal nodes  palpated Neurologic: Grossly normal   Pelvic: External genitalia:  no lesions              Urethra:  normal appearing urethra with no masses, tenderness or lesions              Bartholins and Skenes: normal                 Vagina: normal appearing vagina with normal color and discharge, no lesions              Cervix: no lesions, IUD string 5 cm               Bimanual Exam:  Uterus:  normal size, contour, position, consistency, mobility, non-tender              Adnexa: no mass, fullness, tenderness               Rectovaginal: Confirms               Anus:  normal sphincter tone, no lesions  Pelvic floor: bilaterally tender, L>R, feels tighter on the left  Chaperone was present for exam.  A:  Well Woman with normal exam  Dysuria, has WBC in her urine, suspect UTI  Anxiety, depression, in counseling  Fatigue, hair loss  H/O diabetes, significant weight loss  Rash, c/w candida intertrigo   Vit d def  Dyspareunia, pelvic floor tenderness  IUD check     P:   No pap this year  Screening labs, HgbA1C, vit D  She should control the rate and depth of penetration with intercourse, use a lubricant  Will refer to PT for dyspareunia  Information on gardasil given, will start if insurance approves  Discussed breast self exam  Discussed calcium and vit D intake  Vulvar skin care information given  Bactrim and pyridium for suspected UTI  Urine for ua, c&s  Referral to PT for dyspareunia and pelvic floor pain  In addition to her annual exam more than another 25 minutes was spent face to face evaluating and counseling her on multiple issues, including dysuria, depression/anxiety, candida intertrigo, dyspareunia, and vulvar skin care. Over 50% of that time was in counseling

## 2017-05-30 NOTE — Patient Instructions (Addendum)
Can take magnesium 500 mg a day for constipation Can try miralax if needed  EXERCISE AND DIET:  We recommended that you start or continue a regular exercise program for good health. Regular exercise means any activity that makes your heart beat faster and makes you sweat.  We recommend exercising at least 30 minutes per day at least 3 days a week, preferably 4 or 5.  We also recommend a diet low in fat and sugar.  Inactivity, poor dietary choices and obesity can cause diabetes, heart attack, stroke, and kidney damage, among others.    ALCOHOL AND SMOKING:  Women should limit their alcohol intake to no more than 7 drinks/beers/glasses of wine (combined, not each!) per week. Moderation of alcohol intake to this level decreases your risk of breast cancer and liver damage. And of course, no recreational drugs are part of a healthy lifestyle.  And absolutely no smoking or even second hand smoke. Most people know smoking can cause heart and lung diseases, but did you know it also contributes to weakening of your bones? Aging of your skin?  Yellowing of your teeth and nails?  CALCIUM AND VITAMIN D:  Adequate intake of calcium and Vitamin D are recommended.  The recommendations for exact amounts of these supplements seem to change often, but generally speaking 600 mg of calcium (either carbonate or citrate) and 800 units of Vitamin D per day seems prudent. Certain women may benefit from higher intake of Vitamin D.  If you are among these women, your doctor will have told you during your visit.    PAP SMEARS:  Pap smears, to check for cervical cancer or precancers,  have traditionally been done yearly, although recent scientific advances have shown that most women can have pap smears less often.  However, every woman still should have a physical exam from her gynecologist every year. It will include a breast check, inspection of the vulva and vagina to check for abnormal growths or skin changes, a visual exam of the  cervix, and then an exam to evaluate the size and shape of the uterus and ovaries.  And after 37 years of age, a rectal exam is indicated to check for rectal cancers. We will also provide age appropriate advice regarding health maintenance, like when you should have certain vaccines, screening for sexually transmitted diseases, bone density testing, colonoscopy, mammograms, etc.   MAMMOGRAMS:  All women over 59 years old should have a yearly mammogram. Many facilities now offer a "3D" mammogram, which may cost around $50 extra out of pocket. If possible,  we recommend you accept the option to have the 3D mammogram performed.  It both reduces the number of women who will be called back for extra views which then turn out to be normal, and it is better than the routine mammogram at detecting truly abnormal areas.    COLONOSCOPY:  Colonoscopy to screen for colon cancer is recommended for all women at age 61.  We know, you hate the idea of the prep.  We agree, BUT, having colon cancer and not knowing it is worse!!  Colon cancer so often starts as a polyp that can be seen and removed at colonscopy, which can quite literally save your life!  And if your first colonoscopy is normal and you have no family history of colon cancer, most women don't have to have it again for 10 years.  Once every ten years, you can do something that may end up saving your life, right?  We will be happy to help you get it scheduled when you are ready.  Be sure to check your insurance coverage so you understand how much it will cost.  It may be covered as a preventative service at no cost, but you should check your particular policy.      Urinary Tract Infection, Adult A urinary tract infection (UTI) is an infection of any part of the urinary tract. The urinary tract includes the:  Kidneys.  Ureters.  Bladder.  Urethra.  These organs make, store, and get rid of pee (urine) in the body. Follow these instructions at  home:  Take over-the-counter and prescription medicines only as told by your doctor.  If you were prescribed an antibiotic medicine, take it as told by your doctor. Do not stop taking the antibiotic even if you start to feel better.  Avoid the following drinks: ? Alcohol. ? Caffeine. ? Tea. ? Carbonated drinks.  Drink enough fluid to keep your pee clear or pale yellow.  Keep all follow-up visits as told by your doctor. This is important.  Make sure to: ? Empty your bladder often and completely. Do not to hold pee for long periods of time. ? Empty your bladder before and after sex. ? Wipe from front to back after a bowel movement if you are female. Use each tissue one time when you wipe. Contact a doctor if:  You have back pain.  You have a fever.  You feel sick to your stomach (nauseous).  You throw up (vomit).  Your symptoms do not get better after 3 days.  Your symptoms go away and then come back. Get help right away if:  You have very bad back pain.  You have very bad lower belly (abdominal) pain.  You are throwing up and cannot keep down any medicines or water. This information is not intended to replace advice given to you by your health care provider. Make sure you discuss any questions you have with your health care provider. Document Released: 10/10/2007 Document Revised: 09/29/2015 Document Reviewed: 03/14/2015 Elsevier Interactive Patient Education  2018 [employer] Breast self-awareness means being familiar with how your breasts look and feel. It involves checking your breasts regularly and reporting any changes to your health care provider. Practicing breast self-awareness is important. A change in your breasts can be a sign of a serious medical problem. Being familiar with how your breasts look and feel allows you to find any problems early, when treatment is more likely to be successful. All women should practice breast  self-awareness, including women who have had breast implants. How to do a breast self-exam One way to learn what is normal for your breasts and whether your breasts are changing is to do a breast self-exam. To do a breast self-exam: Look for Changes  1. Remove all the clothing above your waist. 2. Stand in front of a mirror in a room with good lighting. 3. Put your hands on your hips. 4. Push your hands firmly downward. 5. Compare your breasts in the mirror. Look for differences between them (asymmetry), such as: ? Differences in shape. ? Differences in size. ? Puckers, dips, and bumps in one breast and not the other. 6. Look at each breast for changes in your skin, such as: ? Redness. ? Scaly areas. 7. Look for changes in your nipples, such as: ? Discharge. ? Bleeding. ? Dimpling. ? Redness. ? A change in position. Feel for Changes  Carefully feel  your breasts for lumps and changes. It is best to do this while lying on your back on the floor and again while sitting or standing in the shower or tub with soapy water on your skin. Feel each breast in the following way:  Place the arm on the side of the breast you are examining above your head.  Feel your breast with the other hand.  Start in the nipple area and make  inch (2 cm) overlapping circles to feel your breast. Use the pads of your three middle fingers to do this. Apply light pressure, then medium pressure, then firm pressure. The light pressure will allow you to feel the tissue closest to the skin. The medium pressure will allow you to feel the tissue that is a little deeper. The firm pressure will allow you to feel the tissue close to the ribs.  Continue the overlapping circles, moving downward over the breast until you feel your ribs below your breast.  Move one finger-width toward the center of the body. Continue to use the  inch (2 cm) overlapping circles to feel your breast as you move slowly up toward your  collarbone.  Continue the up and down exam using all three pressures until you reach your armpit.  Write Down What You Find  Write down what is normal for each breast and any changes that you find. Keep a written record with breast changes or normal findings for each breast. By writing this information down, you do not need to depend only on memory for size, tenderness, or location. Write down where you are in your menstrual cycle, if you are still menstruating. If you are having trouble noticing differences in your breasts, do not get discouraged. With time you will become more familiar with the variations in your breasts and more comfortable with the exam. How often should I examine my breasts? Examine your breasts every month. If you are breastfeeding, the best time to examine your breasts is after a feeding or after using a breast pump. If you menstruate, the best time to examine your breasts is 5-7 days after your period is over. During your period, your breasts are lumpier, and it may be more difficult to notice changes. When should I see my health care provider? See your health care provider if you notice:  A change in shape or size of your breasts or nipples.  A change in the skin of your breast or nipples, such as a reddened or scaly area.  Unusual discharge from your nipples.  A lump or thick area that was not there before.  Pain in your breasts.  Anything that concerns you.  This information is not intended to replace advice given to you by your health care provider. Make sure you discuss any questions you have with your health care provider. Document Released: 04/23/2005 Document Revised: 09/29/2015 Document Reviewed: 03/13/2015 Elsevier Interactive Patient Education  [employer].

## 2017-05-31 ENCOUNTER — Telehealth: Payer: Self-pay

## 2017-05-31 LAB — COMPREHENSIVE METABOLIC PANEL
ALBUMIN: 4.5 g/dL (ref 3.5–5.5)
ALT: 13 IU/L (ref 0–32)
AST: 12 IU/L (ref 0–40)
Albumin/Globulin Ratio: 1.6 (ref 1.2–2.2)
Alkaline Phosphatase: 78 IU/L (ref 39–117)
BUN / CREAT RATIO: 17 (ref 9–23)
BUN: 17 mg/dL (ref 6–20)
Bilirubin Total: 0.4 mg/dL (ref 0.0–1.2)
CALCIUM: 10.1 mg/dL (ref 8.7–10.2)
CO2: 23 mmol/L (ref 20–29)
CREATININE: 0.98 mg/dL (ref 0.57–1.00)
Chloride: 100 mmol/L (ref 96–106)
GFR, EST AFRICAN AMERICAN: 86 mL/min/{1.73_m2} (ref >59)
GFR, EST NON AFRICAN AMERICAN: 74 mL/min/{1.73_m2} (ref >59)
GLUCOSE: 97 mg/dL (ref 65–99)
Globulin, Total: 2.8 g/dL (ref 1.5–4.5)
Potassium: 4.1 mmol/L (ref 3.5–5.2)
Sodium: 139 mmol/L (ref 134–144)
TOTAL PROTEIN: 7.3 g/dL (ref 6.0–8.5)

## 2017-05-31 LAB — CBC
HEMATOCRIT: 38.9 % (ref 34.0–46.6)
HEMOGLOBIN: 13.4 g/dL (ref 11.1–15.9)
MCH: 29.1 pg (ref 26.6–33.0)
MCHC: 34.4 g/dL (ref 31.5–35.7)
MCV: 84 fL (ref 79–97)
Platelets: 266 10*3/uL (ref 150–379)
RBC: 4.61 x10E6/uL (ref 3.77–5.28)
RDW: 14.3 % (ref 12.3–15.4)
WBC: 9.2 10*3/uL (ref 3.4–10.8)

## 2017-05-31 LAB — LIPID PANEL
Chol/HDL Ratio: 3.8 ratio (ref 0.0–4.4)
Cholesterol, Total: 157 mg/dL (ref 100–199)
HDL: 41 mg/dL (ref >39)
LDL Calculated: 98 mg/dL (ref 0–99)
Triglycerides: 89 mg/dL (ref 0–149)
VLDL CHOLESTEROL CAL: 18 mg/dL (ref 5–40)

## 2017-05-31 LAB — VITAMIN D 25 HYDROXY (VIT D DEFICIENCY, FRACTURES): VIT D 25 HYDROXY: 48 ng/mL (ref 30.0–100.0)

## 2017-05-31 LAB — URINALYSIS, MICROSCOPIC ONLY: CASTS: NONE SEEN /LPF

## 2017-05-31 LAB — HEMOGLOBIN A1C
Est. average glucose Bld gHb Est-mCnc: 111 mg/dL
Hgb A1c MFr Bld: 5.5 % (ref 4.8–5.6)

## 2017-05-31 LAB — TSH: TSH: 1.72 u[IU]/mL (ref 0.450–4.500)

## 2017-05-31 NOTE — Telephone Encounter (Signed)
Spoke with patient, results reviewed.

## 2017-05-31 NOTE — Telephone Encounter (Signed)
Spoke with patient. Patient is requesting recent results. Advised lipid panel. Vitamin D, TSH, Hemoglobin A1c, CMP, and CBC are all normal. Advised urine culture is still pending. Urine micro showed some calcium oxalate crystals. Per patient no history of kidney stones. Is having some lower back pain today. Denies any Advised will review with MD and return call with any additional recommendations regarding results.

## 2017-06-02 LAB — URINE CULTURE

## 2017-06-03 ENCOUNTER — Telehealth: Payer: Self-pay | Admitting: *Deleted

## 2017-06-03 MED ORDER — NITROFURANTOIN MONOHYD MACRO 100 MG PO CAPS: 100.0000 mg | ORAL_CAPSULE | Freq: Two times a day (BID) | ORAL | 0 refills | Status: AC

## 2017-06-03 NOTE — Telephone Encounter (Signed)
Spoke with patient. Reports symptoms have not worsened or improved. Reports lower back pain, pain with urination and increased vaginal d/c. Has 2 doses of         Bactrim left.   Advised patient need to confirm Macrobid Rx and return call, patient verbalizes understanding and is agreeable.

## 2017-06-03 NOTE — Telephone Encounter (Signed)
Call to patient to check on how she is feeling. Left message to call back and ask for triage nurse.

## 2017-06-03 NOTE — Telephone Encounter (Signed)
-----   Message from Salvadore Dom, MD sent at 06/03/2017 10:20 AM EST ----- Why isn't there sensitivity testing for Bactrim. She is on Bactrim DS. Please call the lab and find out why bactrim isn't listed and if it can be added (with results today). If not, please call the patient and see how she is feeling. If she isn't better I would change her to macrobid, 100 mg BID

## 2017-06-03 NOTE — Telephone Encounter (Signed)
Reviewed with Dr. Talbert Nan.  Rx for Macrobid 100 mg bid x5 days to pharmacy on file.  Call returned to patient, left detailed message, ok per current dpr. Advised of RX for Macrobid and instructions to [facility] outpatient pharmacy, return call to office with any additional questions/concerns.  Routing to provider for final review. Patient is agreeable to disposition. Will close encounter.

## 2017-06-11 ENCOUNTER — Telehealth: Payer: Self-pay | Admitting: Neurology

## 2017-06-11 ENCOUNTER — Ambulatory Visit: Payer: Self-pay | Admitting: Neurology

## 2017-06-11 NOTE — Telephone Encounter (Signed)
Pt no showed to apt

## 2017-06-12 ENCOUNTER — Encounter: Payer: Self-pay | Admitting: Neurology

## 2017-06-12 NOTE — Progress Notes (Deleted)
Subjective:    [name], female    DOB: 1980/07/24, 37 y.o.   MRN: [id number]  HPI:  Ms. Theys presents with  Patient Care Team    Relationship Specialty Notifications Start End  Esaw Grandchild, NP PCP - General Family Medicine  10/18/16     Patient Active Problem List   Diagnosis Date Noted  . Dehydration 03/12/2017  . 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 Sleep apnea 01/09/2017  . Morbid obesity with BMI of 45.0-49.9, adult ([place]) 01/09/2017  . Healthcare maintenance 10/18/2016  . Type 2 diabetes mellitus without complications ([place]) 34/74/2595  . LOW BACK PAIN, ACUTE 11/20/2006  . POLYCYSTIC OVARY 07/04/2006  . OBESITY, NOS 07/04/2006  . TOBACCO USE, QUIT 07/04/2006     Past Medical History:  Diagnosis Date  . Abnormal uterine bleeding   . Amenorrhea   . Anemia   . Anxiety    panic attacks  . Diabetes mellitus without complication ([place])    type 2  . Dysmenorrhea   . GERD (gastroesophageal reflux disease)    occasional - diet controlled and tums prn  . Hyperlipidemia    no meds -diet controlled  . Infertility, female   . Insulin resistance   . Migraine without aura   . Missed abortion    x 2 - both resolved without surgery  . Obesity   . PCOS (polycystic ovarian syndrome)   . PCOS (polycystic ovarian syndrome)   . PONV (postoperative nausea and vomiting)    pe rpatient ; scopalamine patch very helpful   . Sleep apnea   . SVD (spontaneous vaginal delivery)    x 1  . Urinary incontinence      Past Surgical History:  Procedure Laterality Date  . CHOLECYSTECTOMY    . COLONOSCOPY  2017   polyps  . CYSTOSCOPY N/A 10/29/2016   Procedure: CYSTOSCOPY;  Surgeon: Salvadore Dom, MD;  Location: [facility];  Service: Gynecology;  Laterality: N/A;  . DILATATION & CURRETTAGE/HYSTEROSCOPY WITH RESECTOCOPE N/A 10/17/2012   Procedure: DILATATION & CURETTAGE/HYSTEROSCOPY WITH RESECTOCOPE; Possible Polypectomy, Possible  Resectoscopic Myomectomy.;  Surgeon: Floyce Stakes. Pamala Hurry, MD;  Location: [facility];  Service: Gynecology;  Laterality: N/A;  1 hr.  Marland Kitchen DILATION AND CURETTAGE OF UTERUS    . ENDOMETRIAL BIOPSY    . ESOPHAGOGASTRODUODENOSCOPY ENDOSCOPY  2017   Normal  . HYSTEROSCOPY W/D&C N/A 12/01/2013   Procedure: IUD Removal;  Surgeon: Claiborne Billings A. Pamala Hurry, MD;  Location: [facility];  Service: Gynecology;  Laterality: N/A;  90 min.  . INTRAUTERINE DEVICE (IUD) INSERTION N/A 10/17/2012   Procedure: INTRAUTERINE DEVICE (IUD) INSERTION; Mirena;  Surgeon: Floyce Stakes. Pamala Hurry, MD;  Location: [facility];  Service: Gynecology;  Laterality: N/A;  . LAPAROSCOPIC GASTRIC SLEEVE RESECTION N/A 02/19/2017   Procedure: LAPAROSCOPIC GASTRIC SLEEVE RESECTION, UPPER ENDOSCOPY;  Surgeon: Johnathan Hausen, MD;  Location: [facility];  Service: General;  Laterality: N/A;  . LAPAROSCOPIC SALPINGO OOPHERECTOMY Left 10/29/2016   Procedure: LAPAROSCOPIC SALPINGO OOPHORECTOMY With anterior uterine biopsy;  Surgeon: Salvadore Dom, MD;  Location: [facility];  Service: Gynecology;  Laterality: Left;  . MOUTH SURGERY     wisdom teeth     Family History  Problem Relation Age of Onset  . Diabetes Father   . Thyroid disease Father   . Hypertension Father   . Diabetes Maternal Grandmother   . Heart disease Maternal Grandmother   . Diabetes Maternal Grandfather   . Heart disease Maternal Grandfather   .  Stroke Maternal Grandfather   . Diabetes Paternal Grandmother   . Diabetes Paternal Grandfather   . Heart disease Paternal Grandfather   . Alcohol abuse Paternal Grandfather   . Diabetes Other   . Obesity Other   . Sleep apnea Other   . Hyperlipidemia Other   . Hypertension Other   . Cancer Other   . Heart disease Other   . Hypertension Mother      Social History   Substance and Sexual Activity  Drug Use No     Social History   Substance and Sexual Activity  Alcohol Use No  . Frequency: Never     Social History   Tobacco Use   Smoking Status Former Smoker  . Packs/day: 0.25  . Years: 19.00  . Pack years: 4.75  . Types: Cigarettes  . Last attempt to quit: 06/06/2016  . Years since quitting: 1.0  Smokeless Tobacco Never Used     Outpatient Encounter Medications as of 06/13/2017  Medication Sig Note  . blood glucose meter kit and supplies KIT Dispense based on patient and insurance preference. Use up to four times daily as directed. (FOR ICD-9 250.00, 250.01).   . citalopram (CELEXA) 20 MG tablet Take 1/2 a tablet a day for 1 week , if tolerating well, increase to one tablet a day.   . diazepam (VALIUM) 5 MG tablet Take 1 tablet (5 mg total) by mouth every 8 (eight) hours as needed for anxiety.   . hydrocortisone (ANUSOL-HC) 25 MG suppository Insert rectally twice daily for 5 days   . levonorgestrel (MIRENA) 20 MCG/24HR IUD 1 each by Intrauterine route once. 02/18/2017: On hold   . Multiple Vitamins-Iron (MULTIVITAMINS WITH IRON) TABS tablet Take 1 tablet by mouth 2 (two) times daily. CHEWABLE   . nystatin (MYCOSTATIN/NYSTOP) powder Apply topically 4 (four) times daily.   Marland Kitchen nystatin cream (MYCOSTATIN) Apply 1 application topically 2 (two) times daily. Apply to affected area BID for up to 7 days.   . ondansetron (ZOFRAN) 4 MG tablet as needed.   Marland Kitchen OVER THE COUNTER MEDICATION Take 1 tablet by mouth 3 (three) times daily. Bariatric calcium '500mg'$    . pantoprazole (PROTONIX) 40 MG tablet    . phenazopyridine (PYRIDIUM) 200 MG tablet Take 1 tablet (200 mg total) by mouth 3 (three) times daily as needed.   . Probiotic Product (DIGESTIVE ADVANTAGE GUMMIES) CHEW Chew 1 each by mouth 3 (three) times daily.    Marland Kitchen sulfamethoxazole-trimethoprim (BACTRIM DS) 800-160 MG tablet Take 1 tablet by mouth 2 (two) times daily. One PO BID x 3 days    No facility-administered encounter medications on file as of 06/13/2017.     Allergies: Iohexol  There is no height or weight on file to calculate BMI.  There were no vitals taken for  this visit.     Review of Systems     Objective:   Physical Exam        Assessment & Plan:

## 2017-06-13 ENCOUNTER — Ambulatory Visit: Payer: Self-pay | Admitting: Adult Health

## 2017-07-30 ENCOUNTER — Ambulatory Visit (INDEPENDENT_AMBULATORY_CARE_PROVIDER_SITE_OTHER): Payer: No Typology Code available for payment source | Admitting: Adult Health

## 2017-07-30 ENCOUNTER — Ambulatory Visit: Payer: No Typology Code available for payment source

## 2017-07-30 ENCOUNTER — Encounter: Payer: Self-pay | Admitting: Adult Health

## 2017-07-30 VITALS — BP 115/79 | HR 72 | Ht 67.52 in | Wt 229.0 lb

## 2017-07-30 DIAGNOSIS — M5441 Lumbago with sciatica, right side: Secondary | ICD-10-CM | POA: Diagnosis not present

## 2017-07-30 MED ORDER — METHYLPREDNISOLONE SODIUM SUCC 125 MG IJ SOLR
125.0000 mg | Freq: Once | INTRAMUSCULAR | Status: AC
  Administered 2017-07-30: 125 mg via INTRAMUSCULAR

## 2017-07-30 MED ORDER — CYCLOBENZAPRINE HCL 10 MG PO TABS: 10.0000 mg | ORAL_TABLET | Freq: Three times a day (TID) | ORAL | 0 refills | Status: DC | PRN

## 2017-07-30 MED ORDER — PREDNISONE 20 MG PO TABS: ORAL_TABLET | ORAL | 0 refills | Status: DC

## 2017-07-30 NOTE — Patient Instructions (Addendum)
Back Pain, Adult Many adults have back pain from time to time. Common causes of back pain include:  A strained muscle or ligament.  Wear and tear (degeneration) of the spinal disks.  Arthritis.  A hit to the back.  Back pain can be short-lived (acute) or last a long time (chronic). A physical exam, lab tests, and imaging studies may be done to find the cause of your pain. Follow these instructions at home: Managing pain and stiffness  Take over-the-counter and prescription medicines only as told by your health care provider.  If directed, apply heat to the affected area as often as told by your health care provider. Use the heat source that your health care provider recommends, such as a moist heat pack or a heating pad. ? Place a towel between your skin and the heat source. ? Leave the heat on for 20-30 minutes. ? Remove the heat if your skin turns bright red. This is especially important if you are unable to feel pain, heat, or cold. You have a greater risk of getting burned.  If directed, apply ice to the injured area: ? Put ice in a plastic bag. ? Place a towel between your skin and the bag. ? Leave the ice on for 20 minutes, 2-3 times a day for the first 2-3 days. Activity  Do not stay in bed. Resting more than 1-2 days can delay your recovery.  Take short walks on even surfaces as soon as you are able. Try to increase the length of time you walk each day.  Do not sit, drive, or stand in one place for more than 30 minutes at a time. Sitting or standing for long periods of time can put stress on your back.  Use proper lifting techniques. When you bend and lift, use positions that put less stress on your back: ? Bend your knees. ? Keep the load close to your body. ? Avoid twisting.  Exercise regularly as told by your health care provider. Exercising will help your back heal faster. This also helps prevent back injuries by keeping muscles strong and flexible.  Your health  care provider may recommend that you see a physical therapist. This person can help you come up with a safe exercise program. Do any exercises as told by your physical therapist. Lifestyle  Maintain a healthy weight. Extra weight puts stress on your back and makes it difficult to have good posture.  Avoid activities or situations that make you feel anxious or stressed. Learn ways to manage anxiety and stress. One way to manage stress is through exercise. Stress and anxiety increase muscle tension and can make back pain worse. General instructions  Sleep on a firm mattress in a comfortable position. Try lying on your side with your knees slightly bent. If you lie on your back, put a pillow under your knees.  Follow your treatment plan as told by your health care provider. This may include: ? Cognitive or behavioral therapy. ? Acupuncture or massage therapy. ? Meditation or yoga. Contact a health care provider if:  You have pain that is not relieved with rest or medicine.  You have increasing pain going down into your legs or buttocks.  Your pain does not improve in 2 weeks.  You have pain at night.  You lose weight.  You have a fever or chills. Get help right away if:  You develop new bowel or bladder control problems.  You have unusual weakness or numbness in your arms   or legs.  You develop nausea or vomiting.  You develop abdominal pain.  You feel faint. Summary  Many adults have back pain from time to time. A physical exam, lab tests, and imaging studies may be done to find the cause of your pain.  Use proper lifting techniques. When you bend and lift, use positions that put less stress on your back.  Take over-the-counter and prescription medicines and apply heat or ice as directed by your health care provider. This information is not intended to replace advice given to you by your health care provider. Make sure you discuss any questions you have with your health care  provider. Document Released: 04/23/2005 Document Revised: 05/28/2016 Document Reviewed: 05/28/2016 Elsevier Interactive Patient Education  [employer].   Please take medications as directed. Do not start prednisone until tomorrow 07/31/17, since Solumedrol injection given in clinic today. Rest and limit driving. Apply ice to back for 20 mins several times daily. Work Geneticist, molecular provided. Urgent referral to Dr. Doy Mince Neurosurgery placed. Please call with any questions/concerns. FEEL BETTER!

## 2017-07-30 NOTE — Progress Notes (Addendum)
Subjective:    [name], female    DOB: [birth year], 37 y.o.   MRN: [id number]  HPI:  [name] presents with R lumbar back pain with R sided sciatica that started Sunday am. She exercised on elliptical machine for 45 mins sat, but has been doing that for months. She denies any acute/injury prior to onset of pain. She had large, formed BM Sunday am with some streaks of bright red blood, r/t hemorrhoids. She denies any current bowel/bladder dysfunction.  She denies numbness/tingling in lower extremities.  Pain is localized to R lumbar, 0/10 when lying down 7/10 sharp pain with any movement. She has difficulty ambulating, standing in certain positions, and is quite uncomfortable drving. She has hx of mild lumbar disc and facet degeneration, last MRI Lumbar Spine 06/29/2008 She reports previously being treated by Dr. Shanon Brow Jones/[facility] for back pain. She has been alternating ice and heat, and using OTC Ibuprofen with only minimal sx relief.  Patient Care Team    Relationship Specialty Notifications Start End  Esaw Grandchild, NP PCP - General Family Medicine  10/18/16     Patient Active Problem List   Diagnosis Date Noted  . Dehydration 03/12/2017  . 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 Sleep apnea 01/09/2017  . Morbid obesity with BMI of 45.0-49.9, adult ([place]) 01/09/2017  . Healthcare maintenance 10/18/2016  . Type 2 diabetes mellitus without complications ([place]) 75/44/9201  . LOW BACK PAIN, ACUTE 11/20/2006  . POLYCYSTIC OVARY 07/04/2006  . OBESITY, NOS 07/04/2006  . TOBACCO USE, QUIT 07/04/2006     Past Medical History:  Diagnosis Date  . Abnormal uterine bleeding   . Amenorrhea   . Anemia   . Anxiety    panic attacks  . Diabetes mellitus without complication ([place])    type 2  . Dysmenorrhea   . GERD (gastroesophageal reflux disease)    occasional - diet controlled and tums prn  . Hyperlipidemia    no meds  -diet controlled  . Infertility, female   . Insulin resistance   . Migraine without aura   . Missed abortion    x 2 - both resolved without surgery  . Obesity   . PCOS (polycystic ovarian syndrome)   . PCOS (polycystic ovarian syndrome)   . PONV (postoperative nausea and vomiting)    pe rpatient ; scopalamine patch very helpful   . Sleep apnea   . SVD (spontaneous vaginal delivery)    x 1  . Urinary incontinence      Past Surgical History:  Procedure Laterality Date  . CHOLECYSTECTOMY    . COLONOSCOPY  2017   polyps  . CYSTOSCOPY N/A 10/29/2016   Procedure: CYSTOSCOPY;  Surgeon: Salvadore Dom, MD;  Location: [facility];  Service: Gynecology;  Laterality: N/A;  . DILATATION & CURRETTAGE/HYSTEROSCOPY WITH RESECTOCOPE N/A 10/17/2012   Procedure: DILATATION & CURETTAGE/HYSTEROSCOPY WITH RESECTOCOPE; Possible Polypectomy, Possible Resectoscopic Myomectomy.;  Surgeon: Floyce Stakes. Pamala Hurry, MD;  Location: [facility];  Service: Gynecology;  Laterality: N/A;  1 hr.  Marland Kitchen DILATION AND CURETTAGE OF UTERUS    . ENDOMETRIAL BIOPSY    . ESOPHAGOGASTRODUODENOSCOPY ENDOSCOPY  2017   Normal  . HYSTEROSCOPY W/D&C N/A 12/01/2013   Procedure: IUD Removal;  Surgeon: Claiborne Billings A. Pamala Hurry, MD;  Location: [facility];  Service: Gynecology;  Laterality: N/A;  90 min.  . INTRAUTERINE DEVICE (IUD) INSERTION N/A 10/17/2012   Procedure: INTRAUTERINE DEVICE (IUD) INSERTION; Mirena;  Surgeon: Floyce Stakes. Pamala Hurry,  MD;  Location: [facility];  Service: Gynecology;  Laterality: N/A;  . LAPAROSCOPIC GASTRIC SLEEVE RESECTION N/A 02/19/2017   Procedure: LAPAROSCOPIC GASTRIC SLEEVE RESECTION, UPPER ENDOSCOPY;  Surgeon: Johnathan Hausen, MD;  Location: [facility];  Service: General;  Laterality: N/A;  . LAPAROSCOPIC SALPINGO OOPHERECTOMY Left 10/29/2016   Procedure: LAPAROSCOPIC SALPINGO OOPHORECTOMY With anterior uterine biopsy;  Surgeon: Salvadore Dom, MD;  Location: [facility];  Service: Gynecology;  Laterality: Left;  . MOUTH SURGERY      wisdom teeth     Family History  Problem Relation Age of Onset  . Diabetes Father   . Thyroid disease Father   . Hypertension Father   . Diabetes Maternal Grandmother   . Heart disease Maternal Grandmother   . Diabetes Maternal Grandfather   . Heart disease Maternal Grandfather   . Stroke Maternal Grandfather   . Diabetes Paternal Grandmother   . Diabetes Paternal Grandfather   . Heart disease Paternal Grandfather   . Alcohol abuse Paternal Grandfather   . Diabetes Other   . Obesity Other   . Sleep apnea Other   . Hyperlipidemia Other   . Hypertension Other   . Cancer Other   . Heart disease Other   . Hypertension Mother      Social History   Substance and Sexual Activity  Drug Use No     Social History   Substance and Sexual Activity  Alcohol Use No  . Frequency: Never     Social History   Tobacco Use  Smoking Status Former Smoker  . Packs/day: 0.25  . Years: 19.00  . Pack years: 4.75  . Types: Cigarettes  . Last attempt to quit: 06/06/2016  . Years since quitting: 1.1  Smokeless Tobacco Never Used     Outpatient Encounter Medications as of 07/30/2017  Medication Sig Note  . blood glucose meter kit and supplies KIT Dispense based on patient and insurance preference. Use up to four times daily as directed. (FOR ICD-9 250.00, 250.01).   . citalopram (CELEXA) 20 MG tablet Take 1/2 a tablet a day for 1 week , if tolerating well, increase to one tablet a day.   . cyclobenzaprine (FLEXERIL) 10 MG tablet Take 1 tablet (10 mg total) by mouth 3 (three) times daily as needed for muscle spasms.   . diazepam (VALIUM) 5 MG tablet Take 1 tablet (5 mg total) by mouth every 8 (eight) hours as needed for anxiety.   Marland Kitchen levonorgestrel (MIRENA) 20 MCG/24HR IUD 1 each by Intrauterine route once. 02/18/2017: On hold   . Multiple Vitamins-Iron (MULTIVITAMINS WITH IRON) TABS tablet Take 1 tablet by mouth 2 (two) times daily. CHEWABLE   . nystatin (MYCOSTATIN/NYSTOP) powder  Apply topically 4 (four) times daily.   Marland Kitchen nystatin cream (MYCOSTATIN) Apply 1 application topically 2 (two) times daily. Apply to affected area BID for up to 7 days.   . ondansetron (ZOFRAN) 4 MG tablet as needed.   Marland Kitchen OVER THE COUNTER MEDICATION Take 1 tablet by mouth 3 (three) times daily. Bariatric calcium 538m   . pantoprazole (PROTONIX) 40 MG tablet    . predniSONE (DELTASONE) 20 MG tablet 1 tab every 12 hrs for 3 days. 1 tab daily for 3 days. 1/2 tab daily for 3 days   . Probiotic Product (DIGESTIVE ADVANTAGE GUMMIES) CHEW Chew 1 each by mouth 3 (three) times daily.    . [DISCONTINUED] cyclobenzaprine (FLEXERIL) 10 MG tablet Take 1 tablet (10 mg total) by mouth 3 (three) times daily as  needed for muscle spasms.   . [DISCONTINUED] hydrocortisone (ANUSOL-HC) 25 MG suppository Insert rectally twice daily for 5 days   . [DISCONTINUED] phenazopyridine (PYRIDIUM) 200 MG tablet Take 1 tablet (200 mg total) by mouth 3 (three) times daily as needed.   . [DISCONTINUED] predniSONE (DELTASONE) 20 MG tablet 1 tab every 12 hrs for 3 days. 1 tab daily for 3 days. 1/2 tab daily for 3 days   . [DISCONTINUED] sulfamethoxazole-trimethoprim (BACTRIM DS) 800-160 MG tablet Take 1 tablet by mouth 2 (two) times daily. One PO BID x 3 days   . [EXPIRED] methylPREDNISolone sodium succinate (SOLU-MEDROL) 125 mg/2 mL injection 125 mg     No facility-administered encounter medications on file as of 07/30/2017.     Allergies: Iohexol  Body mass index is 35.32 kg/m.  Blood pressure 115/79, pulse 72, height 5' 7.52" (1.715 m), weight 229 lb (103.9 kg), SpO2 100 %.     Review of Systems  Constitutional: Positive for activity change, appetite change and fatigue. Negative for chills, diaphoresis, fever and unexpected weight change.  Respiratory: Negative for cough, chest tightness, shortness of breath, wheezing and stridor.   Cardiovascular: Negative for chest pain, palpitations and leg swelling.  Gastrointestinal:  Negative for abdominal distention, abdominal pain, blood in stool, constipation, diarrhea, nausea and vomiting.  Musculoskeletal: Positive for arthralgias, back pain, gait problem and myalgias. Negative for joint swelling, neck pain and neck stiffness.  Neurological: Negative for dizziness and headaches.  Psychiatric/Behavioral: Positive for sleep disturbance. Negative for hallucinations, self-injury and suicidal ideas. The patient is not nervous/anxious and is not hyperactive.        Objective:   Physical Exam  Constitutional: She is oriented to person, place, and time. She appears well-developed and well-nourished. She appears distressed.  HENT:  Head: Normocephalic and atraumatic.  Right Ear: External ear normal.  Left Ear: External ear normal.  Musculoskeletal: She exhibits edema.       Right hip: She exhibits decreased range of motion and tenderness.       Left hip: Normal.       Right knee: She exhibits normal range of motion.       Thoracic back: Normal.       Lumbar back: She exhibits decreased range of motion, tenderness and spasm.  Limited ROM examination due to level of discomfort of pt.   Neurological: She is alert and oriented to person, place, and time.  Skin: Skin is warm and dry. No rash noted. She is not diaphoretic. No erythema. No pallor.  Psychiatric: She has a normal mood and affect. Her behavior is normal. Judgment and thought content normal.  Nursing note and vitals reviewed.         Assessment & Plan:   1. Acute right-sided low back pain with right-sided sciatica     LOW BACK PAIN, ACUTE Lumbar Complete Xray- IMPRESSION: Mild curvature lumbar spine convex left. Minimal L5-S1 disc space narrowing. No fx noted Please take medications as directed. Do not start prednisone until tomorrow, since Solumedrol injection given in clinic today. Rest and limit driving. Apply ice to back for 20 mins several times daily. Work Geneticist, molecular provided. Urgent referral  to Dr. Doy Mince Neurosurgery placed. Please call with any questions/concerns.    FOLLOW-UP:  Return if symptoms worsen or fail to improve.

## 2017-07-30 NOTE — Assessment & Plan Note (Addendum)
Lumbar Complete Xray- IMPRESSION: Mild curvature lumbar spine convex left. Minimal L5-S1 disc space narrowing. No fx noted Please take medications as directed. Do not start prednisone until tomorrow, since Solumedrol injection given in clinic today. Rest and limit driving. Apply ice to back for 20 mins several times daily. Work Geneticist, molecular provided. Urgent referral to Dr. Doy Mince Neurosurgery placed. Please call with any questions/concerns.

## 2017-07-31 NOTE — Progress Notes (Signed)
Voicemail left for pt to call and obtain results. W.Devontae Casasola, CMA

## 2017-08-09 ENCOUNTER — Encounter: Payer: Self-pay | Admitting: Obstetrics and Gynecology

## 2017-08-12 ENCOUNTER — Telehealth: Payer: Self-pay | Admitting: Obstetrics and Gynecology

## 2017-08-12 MED ORDER — PHENAZOPYRIDINE HCL 200 MG PO TABS: 200.0000 mg | ORAL_TABLET | Freq: Three times a day (TID) | ORAL | 0 refills | Status: DC | PRN

## 2017-08-12 MED ORDER — SULFAMETHOXAZOLE-TRIMETHOPRIM 800-160 MG PO TABS: 1.0000 | ORAL_TABLET | Freq: Two times a day (BID) | ORAL | 0 refills | Status: DC

## 2017-08-12 NOTE — Telephone Encounter (Signed)
Left message to call Verdis Bassette at [phone].  

## 2017-08-12 NOTE — Telephone Encounter (Signed)
Spoke with the patient, she is having dysuria and stinging with urination. There is a foul odor to her urine. She is having frequency and urgency.  The itching is really more of a stinging, only notices it when she is voiding.  She is unable to come in Will treat with bactrim and pyridium. If she isn't feeling better in 48 hours, she needs to be seen. Will close the encounter.

## 2017-08-12 NOTE — Telephone Encounter (Signed)
Patient sent the following correspondence through [facility]. Routing to triage to assist patient with request.  ----- Message from [place], Generic sent at 08/09/2017 4:53 PM EDT -----    [facility] you are doing well. So I have another UTI. I'm having exact same symptoms as last time. Can you call in a round of antibiotics for me please to the [facility] in [place]?

## 2017-08-12 NOTE — Telephone Encounter (Signed)
Spoke with patient. Reports itching and burning with urination and odor in urine. Symptoms started 08/07/17.   Denies lower back pain, fever/chills, N/V.   Last treated for UTI 05/30/17.  Advised OV needed for further evaluation, patient declined d/t no insurance, started new job, not effective yet. Patient requesting Dr. Talbert Nan review, advised will return call once reviewed. Patient agreeable.   Dr. Talbert Nan, please review and advise?

## 2017-09-28 ENCOUNTER — Other Ambulatory Visit: Payer: Self-pay

## 2017-09-28 ENCOUNTER — Inpatient Hospital Stay (HOSPITAL_COMMUNITY)
Admission: EM | Admit: 2017-09-28 | Discharge: 2017-10-02 | DRG: 440 | Disposition: A | Discharge status: Home / Self Care | Payer: Managed Care, Other (non HMO) | Attending: Internal Medicine | Admitting: Internal Medicine

## 2017-09-28 ENCOUNTER — Emergency Department (HOSPITAL_COMMUNITY): Payer: Managed Care, Other (non HMO)

## 2017-09-28 ENCOUNTER — Encounter (HOSPITAL_COMMUNITY): Payer: Self-pay

## 2017-09-28 DIAGNOSIS — Z8349 Family history of other endocrine, nutritional and metabolic diseases: Secondary | ICD-10-CM

## 2017-09-28 DIAGNOSIS — Z888 Allergy status to other drugs, medicaments and biological substances status: Secondary | ICD-10-CM

## 2017-09-28 DIAGNOSIS — Z7952 Long term (current) use of systemic steroids: Secondary | ICD-10-CM

## 2017-09-28 DIAGNOSIS — Z8249 Family history of ischemic heart disease and other diseases of the circulatory system: Secondary | ICD-10-CM

## 2017-09-28 DIAGNOSIS — F419 Anxiety disorder, unspecified: Secondary | ICD-10-CM | POA: Diagnosis present

## 2017-09-28 DIAGNOSIS — K219 Gastro-esophageal reflux disease without esophagitis: Secondary | ICD-10-CM | POA: Diagnosis present

## 2017-09-28 DIAGNOSIS — D72829 Elevated white blood cell count, unspecified: Secondary | ICD-10-CM | POA: Diagnosis present

## 2017-09-28 DIAGNOSIS — Z833 Family history of diabetes mellitus: Secondary | ICD-10-CM

## 2017-09-28 DIAGNOSIS — E669 Obesity, unspecified: Secondary | ICD-10-CM | POA: Diagnosis present

## 2017-09-28 DIAGNOSIS — Z87440 Personal history of urinary (tract) infections: Secondary | ICD-10-CM

## 2017-09-28 DIAGNOSIS — Z9049 Acquired absence of other specified parts of digestive tract: Secondary | ICD-10-CM

## 2017-09-28 DIAGNOSIS — Z87891 Personal history of nicotine dependence: Secondary | ICD-10-CM

## 2017-09-28 DIAGNOSIS — Z9884 Bariatric surgery status: Secondary | ICD-10-CM

## 2017-09-28 DIAGNOSIS — K859 Acute pancreatitis without necrosis or infection, unspecified: Secondary | ICD-10-CM | POA: Diagnosis not present

## 2017-09-28 DIAGNOSIS — R51 Headache: Secondary | ICD-10-CM | POA: Diagnosis present

## 2017-09-28 DIAGNOSIS — R110 Nausea: Secondary | ICD-10-CM | POA: Diagnosis present

## 2017-09-28 DIAGNOSIS — Z793 Long term (current) use of hormonal contraceptives: Secondary | ICD-10-CM

## 2017-09-28 DIAGNOSIS — Z823 Family history of stroke: Secondary | ICD-10-CM

## 2017-09-28 DIAGNOSIS — E282 Polycystic ovarian syndrome: Secondary | ICD-10-CM | POA: Diagnosis present

## 2017-09-28 DIAGNOSIS — R933 Abnormal findings on diagnostic imaging of other parts of digestive tract: Secondary | ICD-10-CM

## 2017-09-28 DIAGNOSIS — F410 Panic disorder [episodic paroxysmal anxiety] without agoraphobia: Secondary | ICD-10-CM | POA: Diagnosis present

## 2017-09-28 DIAGNOSIS — E785 Hyperlipidemia, unspecified: Secondary | ICD-10-CM | POA: Diagnosis present

## 2017-09-28 DIAGNOSIS — Z79899 Other long term (current) drug therapy: Secondary | ICD-10-CM

## 2017-09-28 DIAGNOSIS — Z6833 Body mass index (BMI) 33.0-33.9, adult: Secondary | ICD-10-CM

## 2017-09-28 LAB — CBC
HCT: 42.6 % (ref 36.0–46.0)
Hemoglobin: 14.6 g/dL (ref 12.0–15.0)
MCH: 29.4 pg (ref 26.0–34.0)
MCHC: 34.3 g/dL (ref 30.0–36.0)
MCV: 85.9 fL (ref 78.0–100.0)
Platelets: 272 K/uL (ref 150–400)
RBC: 4.96 MIL/uL (ref 3.87–5.11)
RDW: 12.6 % (ref 11.5–15.5)
WBC: 11.3 K/uL — ABNORMAL HIGH (ref 4.0–10.5)

## 2017-09-28 LAB — COMPREHENSIVE METABOLIC PANEL WITH GFR
ALT: 13 U/L — ABNORMAL LOW (ref 14–54)
AST: 15 U/L (ref 15–41)
Albumin: 4.6 g/dL (ref 3.5–5.0)
Alkaline Phosphatase: 82 U/L (ref 38–126)
Anion gap: 9 (ref 5–15)
BUN: 17 mg/dL (ref 6–20)
CO2: 26 mmol/L (ref 22–32)
Calcium: 9.8 mg/dL (ref 8.9–10.3)
Chloride: 105 mmol/L (ref 101–111)
Creatinine, Ser: 0.84 mg/dL (ref 0.44–1.00)
GFR calc Af Amer: >60 mL/min
GFR calc non Af Amer: >60 mL/min
Glucose, Bld: 98 mg/dL (ref 65–99)
Potassium: 4.2 mmol/L (ref 3.5–5.1)
Sodium: 140 mmol/L (ref 135–145)
Total Bilirubin: 0.9 mg/dL (ref 0.3–1.2)
Total Protein: 8.2 g/dL — ABNORMAL HIGH (ref 6.5–8.1)

## 2017-09-28 LAB — URINALYSIS, ROUTINE W REFLEX MICROSCOPIC
Bilirubin Urine: NEGATIVE
Glucose, UA: NEGATIVE mg/dL
Ketones, ur: 5 mg/dL — AB
Nitrite: NEGATIVE
Protein, ur: NEGATIVE mg/dL
Specific Gravity, Urine: 1.019 (ref 1.005–1.030)
pH: 7 (ref 5.0–8.0)

## 2017-09-28 LAB — TRIGLYCERIDES: Triglycerides: 93 mg/dL

## 2017-09-28 LAB — I-STAT BETA HCG BLOOD, ED (MC, WL, AP ONLY): I-stat hCG, quantitative: <5 m[IU]/mL

## 2017-09-28 LAB — LIPASE, BLOOD: Lipase: 32 U/L (ref 11–51)

## 2017-09-28 MED ORDER — SODIUM CHLORIDE 0.9 % IV SOLN
INTRAVENOUS | Status: DC
  Administered 2017-09-29 – 2017-09-30 (×4): via INTRAVENOUS
  Administered 2017-09-30: 1000 mL via INTRAVENOUS
  Administered 2017-10-01 (×2): via INTRAVENOUS
  Administered 2017-10-01: 1000 mL via INTRAVENOUS
  Administered 2017-10-02: 08:00:00 via INTRAVENOUS

## 2017-09-28 MED ORDER — ACETAMINOPHEN 325 MG PO TABS
650.0000 mg | ORAL_TABLET | Freq: Four times a day (QID) | ORAL | Status: DC | PRN
  Administered 2017-09-29 – 2017-10-01 (×4): 650 mg via ORAL
  Filled 2017-09-28 (×4): qty 2

## 2017-09-28 MED ORDER — ONDANSETRON HCL 4 MG/2ML IJ SOLN
4.0000 mg | Freq: Four times a day (QID) | INTRAMUSCULAR | Status: DC | PRN
  Administered 2017-09-29 – 2017-09-30 (×4): 4 mg via INTRAVENOUS
  Filled 2017-09-28 (×4): qty 2

## 2017-09-28 MED ORDER — ALBUTEROL SULFATE (2.5 MG/3ML) 0.083% IN NEBU: 2.5000 mg | INHALATION_SOLUTION | Freq: Four times a day (QID) | RESPIRATORY_TRACT | Status: DC | PRN

## 2017-09-28 MED ORDER — ENOXAPARIN SODIUM 40 MG/0.4ML ~~LOC~~ SOLN
40.0000 mg | Freq: Every day | SUBCUTANEOUS | Status: DC
  Administered 2017-09-29 – 2017-10-01 (×3): 40 mg via SUBCUTANEOUS
  Filled 2017-09-28 (×3): qty 0.4

## 2017-09-28 MED ORDER — ONDANSETRON HCL 4 MG/2ML IJ SOLN
4.0000 mg | Freq: Once | INTRAMUSCULAR | Status: AC
  Administered 2017-09-28: 4 mg via INTRAVENOUS
  Filled 2017-09-28: qty 2

## 2017-09-28 MED ORDER — FENTANYL CITRATE (PF) 100 MCG/2ML IJ SOLN
25.0000 ug | Freq: Once | INTRAMUSCULAR | Status: AC
  Administered 2017-09-28: 25 ug via INTRAVENOUS
  Filled 2017-09-28: qty 2

## 2017-09-28 MED ORDER — FENTANYL CITRATE (PF) 100 MCG/2ML IJ SOLN
25.0000 ug | INTRAMUSCULAR | Status: DC | PRN
  Administered 2017-09-29 (×2): 25 ug via INTRAVENOUS
  Filled 2017-09-28 (×2): qty 2

## 2017-09-28 MED ORDER — SODIUM CHLORIDE 0.9 % IV BOLUS
500.0000 mL | Freq: Once | INTRAVENOUS | Status: AC
  Administered 2017-09-28: 500 mL via INTRAVENOUS

## 2017-09-28 MED ORDER — SODIUM CHLORIDE 0.9 % IV BOLUS
1000.0000 mL | Freq: Once | INTRAVENOUS | Status: AC
  Administered 2017-09-28: 1000 mL via INTRAVENOUS

## 2017-09-28 MED ORDER — ACETAMINOPHEN 650 MG RE SUPP: 650.0000 mg | Freq: Four times a day (QID) | RECTAL | Status: DC | PRN

## 2017-09-28 MED ORDER — SODIUM CHLORIDE 0.9 % IV BOLUS
1000.0000 mL | Freq: Once | INTRAVENOUS | Status: AC
  Administered 2017-09-29: 1000 mL via INTRAVENOUS

## 2017-09-28 MED ORDER — ONDANSETRON HCL 4 MG PO TABS: 4.0000 mg | ORAL_TABLET | Freq: Four times a day (QID) | ORAL | Status: DC | PRN

## 2017-09-28 MED ORDER — FENTANYL CITRATE (PF) 100 MCG/2ML IJ SOLN
50.0000 ug | Freq: Once | INTRAMUSCULAR | Status: AC
  Administered 2017-09-28: 50 ug via INTRAVENOUS
  Filled 2017-09-28: qty 2

## 2017-09-28 MED ORDER — LORAZEPAM 2 MG/ML IJ SOLN
0.5000 mg | Freq: Four times a day (QID) | INTRAMUSCULAR | Status: DC | PRN
  Administered 2017-10-01: 0.5 mg via INTRAVENOUS
  Filled 2017-09-28: qty 1

## 2017-09-28 MED ORDER — SODIUM CHLORIDE 0.9% FLUSH
3.0000 mL | Freq: Two times a day (BID) | INTRAVENOUS | Status: DC
  Administered 2017-10-01 (×2): 3 mL via INTRAVENOUS

## 2017-09-28 NOTE — ED Triage Notes (Signed)
She c/o upper abdl pain radiating to bilat. Flank areas R > L, since 0300 today. She maintains her position of comfort, which is to be bent forward. She states she underwent gastric bypass by Dr. Hassell Done Oct. Of 2018.

## 2017-09-28 NOTE — H&P (Signed)
History and Physical    [name] [name] LEX:[id number] DOB: 1980/09/18 DOA: 09/28/2017  Referring MD/NP/PA: Dr. Gerald Leitz PCP: Esaw Grandchild, NP  Patient coming from: home  Chief Complaint: Abdominal pain  I have personally briefly reviewed patient's old medical records in [place]   HPI: [name] [name] is a 37 y.o. female with medical history significant of DM type II on medication, anxiety, s/p gastric sleeve, and s/p cholecystectomy in 2010; who presents with acute epigastric abdominal pain started around 3 AM this morning.  She thought symptoms may be related to acid reflux when it first started.  Patient describes pain initially as dull, but then changed to be more stabbing/burning like.  Pain radiates to her right flank.  Associated symptoms include nausea, dry heaves, intermittent sweats, belching, chills, anxiousness, mild shortness of breath,  foul urine, and low back pain.  She has not had anything to eat since yesterday evening.  Denies any fever, dysuria, chest pain, leg swelling, calf pain, or lightheadedness.  She is on bariatric multivitamins, calcium chews, and Protonix following her gastric sleeve  ED Course: Upon admission to the emergency department patient was noted to be afebrile, pulse 50-81, and all other vital signs relatively within normal limits.  Labs revealed WBC 11, lipase 32, and triglyceride level 98.  Urinalysis was negative for significant signs of infection.  CT of the abdomen and pelvis revealed early signs for pancreatitis and postoperative gastric sleeve without significant signs of obstruction or inflammation.  General surgery was consulted and will see the patient.  Patient was given 1/2 L of normal saline IV fluids, Zofran, and fentanyl for pain symptoms.  Review of Systems  Constitutional: Positive for chills and diaphoresis. Negative for fever.  HENT: Negative for nosebleeds and sore throat.   Eyes: Negative for double vision and  photophobia.  Respiratory: Negative for cough and sputum production.   Cardiovascular: Negative for chest pain and leg swelling.  Gastrointestinal: Positive for abdominal pain and nausea. Negative for constipation.  Genitourinary: Negative for dysuria and frequency.       Positive for foul odor of urine  Musculoskeletal: Positive for back pain. Negative for falls.  Neurological: Negative for speech change, focal weakness and loss of consciousness.  Psychiatric/Behavioral: Negative for substance abuse and suicidal ideas. The patient is nervous/anxious.     Past Medical History:  Diagnosis Date  . Abnormal uterine bleeding   . Amenorrhea   . Anemia   . Anxiety    panic attacks  . Diabetes mellitus without complication ([place])    type 2  . Dysmenorrhea   . GERD (gastroesophageal reflux disease)    occasional - diet controlled and tums prn  . Hyperlipidemia    no meds -diet controlled  . Infertility, female   . Insulin resistance   . Migraine without aura   . Missed abortion    x 2 - both resolved without surgery  . Obesity   . PCOS (polycystic ovarian syndrome)   . PCOS (polycystic ovarian syndrome)   . PONV (postoperative nausea and vomiting)    pe rpatient ; scopalamine patch very helpful   . Sleep apnea   . SVD (spontaneous vaginal delivery)    x 1  . Urinary incontinence     Past Surgical History:  Procedure Laterality Date  . CHOLECYSTECTOMY    . COLONOSCOPY  2017   polyps  . CYSTOSCOPY N/A 10/29/2016   Procedure: CYSTOSCOPY;  Surgeon: Salvadore Dom, MD;  Location:  [facility];  Service: Gynecology;  Laterality: N/A;  . DILATATION & CURRETTAGE/HYSTEROSCOPY WITH RESECTOCOPE N/A 10/17/2012   Procedure: DILATATION & CURETTAGE/HYSTEROSCOPY WITH RESECTOCOPE; Possible Polypectomy, Possible Resectoscopic Myomectomy.;  Surgeon: Floyce Stakes. Pamala Hurry, MD;  Location: [facility];  Service: Gynecology;  Laterality: N/A;  1 hr.  Marland Kitchen DILATION AND CURETTAGE OF UTERUS    . ENDOMETRIAL BIOPSY     . ESOPHAGOGASTRODUODENOSCOPY ENDOSCOPY  2017   Normal  . HYSTEROSCOPY W/D&C N/A 12/01/2013   Procedure: IUD Removal;  Surgeon: Claiborne Billings A. Pamala Hurry, MD;  Location: [facility];  Service: Gynecology;  Laterality: N/A;  90 min.  . INTRAUTERINE DEVICE (IUD) INSERTION N/A 10/17/2012   Procedure: INTRAUTERINE DEVICE (IUD) INSERTION; Mirena;  Surgeon: Floyce Stakes. Pamala Hurry, MD;  Location: [facility];  Service: Gynecology;  Laterality: N/A;  . LAPAROSCOPIC GASTRIC SLEEVE RESECTION N/A 02/19/2017   Procedure: LAPAROSCOPIC GASTRIC SLEEVE RESECTION, UPPER ENDOSCOPY;  Surgeon: Johnathan Hausen, MD;  Location: [facility];  Service: General;  Laterality: N/A;  . LAPAROSCOPIC SALPINGO OOPHERECTOMY Left 10/29/2016   Procedure: LAPAROSCOPIC SALPINGO OOPHORECTOMY With anterior uterine biopsy;  Surgeon: Salvadore Dom, MD;  Location: [facility];  Service: Gynecology;  Laterality: Left;  . MOUTH SURGERY     wisdom teeth     reports that she quit smoking about 15 months ago. Her smoking use included cigarettes. She has a 4.75 pack-year smoking history. She has never used smokeless tobacco. She reports that she does not drink alcohol or use drugs.  Allergies  Allergen Reactions  . Iohexol Hives     Code: HIVES, Desc:NEED 13 HR PREDISONE AND BENADRYL  BE FOR IV CONTRAST GIVEN /DR OLSON PT DEVELOPED HIVES AND ROOF OF MOUTH BEGIN "ITCHING"", Onset Date: [id number]     Family History  Problem Relation Age of Onset  . Diabetes Father   . Thyroid disease Father   . Hypertension Father   . Diabetes Maternal Grandmother   . Heart disease Maternal Grandmother   . Diabetes Maternal Grandfather   . Heart disease Maternal Grandfather   . Stroke Maternal Grandfather   . Diabetes Paternal Grandmother   . Diabetes Paternal Grandfather   . Heart disease Paternal Grandfather   . Alcohol abuse Paternal Grandfather   . Diabetes Other   . Obesity Other   . Sleep apnea Other   . Hyperlipidemia Other   . Hypertension Other   . Cancer  Other   . Heart disease Other   . Hypertension Mother     Prior to Admission medications   Medication Sig Start Date End Date Taking? Authorizing Provider  blood glucose meter kit and supplies KIT Dispense based on patient and insurance preference. Use up to four times daily as directed. (FOR ICD-9 250.00, 250.01). 10/18/16   Danford, Valetta Fuller D, NP  citalopram (CELEXA) 20 MG tablet Take 1/2 a tablet a day for 1 week , if tolerating well, increase to one tablet a day. 05/30/17   Salvadore Dom, MD  cyclobenzaprine (FLEXERIL) 10 MG tablet Take 1 tablet (10 mg total) by mouth 3 (three) times daily as needed for muscle spasms. 07/30/17   Danford, Valetta Fuller D, NP  diazepam (VALIUM) 5 MG tablet Take 1 tablet (5 mg total) by mouth every 8 (eight) hours as needed for anxiety. 10/18/16   Danford, Valetta Fuller D, NP  levonorgestrel (MIRENA) 20 MCG/24HR IUD 1 each by Intrauterine route once.    [provider]  Multiple Vitamins-Iron (MULTIVITAMINS WITH IRON) TABS tablet Take 1 tablet by mouth 2 (two) times  daily. CHEWABLE    [provider]  nystatin (MYCOSTATIN/NYSTOP) powder Apply topically 4 (four) times daily. 02/22/17   Opalski, Deborah, DO  nystatin cream (MYCOSTATIN) Apply 1 application topically 2 (two) times daily. Apply to affected area BID for up to 7 days. 05/30/17   Salvadore Dom, MD  ondansetron (ZOFRAN) 4 MG tablet as needed. 04/12/17   [provider]  OVER THE COUNTER MEDICATION Take 1 tablet by mouth 3 (three) times daily. Bariatric calcium 575m    [provider]  pantoprazole (PROTONIX) 40 MG tablet  02/07/17   [provider]  phenazopyridine (PYRIDIUM) 200 MG tablet Take 1 tablet (200 mg total) by mouth 3 (three) times daily as needed. 08/12/17   JSalvadore Dom MD  predniSONE (DELTASONE) 20 MG tablet 1 tab every 12 hrs for 3 days. 1 tab daily for 3 days. 1/2 tab daily for 3 days 07/30/17   DMina MarbleD, NP  Probiotic Product (DIGESTIVE  ADVANTAGE GUMMIES) CHEW Chew 1 each by mouth 3 (three) times daily.     [provider]  sulfamethoxazole-trimethoprim (BACTRIM DS) 800-160 MG tablet Take 1 tablet by mouth 2 (two) times daily. One PO BID x 3 days 08/12/17   JSalvadore Dom MD    Physical Exam:  Constitutional: Obese female who appears to be in some distress Vitals:   09/28/17 1813 09/28/17 2051 09/28/17 2138 09/28/17 2201  BP: 128/88 121/69  116/70  Pulse: 81 (!) 59  (!) 50  Resp: _0 Temp: 98.2 F (36.8 C)     TempSrc: Oral     SpO2: 97% 100%  100%  Weight:   100.2 kg (221 lb)   Height:   5' 8" (1.727 m)    Eyes: PERRL, lids and conjunctivae normal ENMT: Mucous membranes are dry. Posterior pharynx clear of any exudate or lesions.Normal dentition.  Neck: normal, supple, no masses, no thyromegaly Respiratory: clear to auscultation bilaterally, no wheezing, no crackles. Normal respiratory effort. No accessory muscle use.  Cardiovascular: Regular rate and rhythm, no murmurs / rubs / gallops. No extremity edema. 2+ pedal pulses. No carotid bruits.  Abdomen: Epigastric tenderness to palpation, no masses palpated. No hepatosplenomegaly. Bowel sounds positive.  Musculoskeletal: no clubbing / cyanosis. No joint deformity upper and lower extremities. Good ROM, no contractures. Normal muscle tone.  Skin: no rashes, lesions, ulcers. No induration Neurologic: CN 2-12 grossly intact. Sensation intact, DTR normal. Strength 5/5 in all 4.  Psychiatric: Normal judgment and insight. Alert and oriented x 3.  Anxious mood.     Labs on Admission: I have personally reviewed following labs and imaging studies  CBC: Recent Labs  Lab 09/28/17 1831  WBC 11.3*  HGB 14.6  HCT 42.6  MCV 85.9  PLT 2749  Basic Metabolic Panel: Recent Labs  Lab 09/28/17 1831  NA 140  K 4.2  CL 105  CO2 26  GLUCOSE 98  BUN 17  CREATININE 0.84  CALCIUM 9.8   GFR: Estimated Creatinine Clearance: 114.6 mL/min (by C-G  formula based on SCr of 0.84 mg/dL). Liver Function Tests: Recent Labs  Lab 09/28/17 1831  AST 15  ALT 13*  ALKPHOS 82  BILITOT 0.9  PROT 8.2*  ALBUMIN 4.6   Recent Labs  Lab 09/28/17 1831  LIPASE 32   No results for input(s): AMMONIA in the last 168 hours. Coagulation Profile: No results for input(s): INR, PROTIME in the last 168 hours. Cardiac Enzymes: No results for input(s):  CKTOTAL, CKMB, CKMBINDEX, TROPONINI in the last 168 hours. BNP (last 3 results) No results for input(s): PROBNP in the last 8760 hours. HbA1C: No results for input(s): HGBA1C in the last 72 hours. CBG: No results for input(s): GLUCAP in the last 168 hours. Lipid Profile: No results for input(s): CHOL, HDL, LDLCALC, TRIG, CHOLHDL, LDLDIRECT in the last 72 hours. Thyroid Function Tests: No results for input(s): TSH, T4TOTAL, FREET4, T3FREE, THYROIDAB in the last 72 hours. Anemia Panel: No results for input(s): VITAMINB12, FOLATE, FERRITIN, TIBC, IRON, RETICCTPCT in the last 72 hours. Urine analysis:    Component Value Date/Time   COLORURINE YELLOW 09/28/2017 1822   APPEARANCEUR CLEAR 09/28/2017 1822   LABSPEC 1.019 09/28/2017 1822   PHURINE 7.0 09/28/2017 1822   GLUCOSEU NEGATIVE 09/28/2017 1822   HGBUR MODERATE (A) 09/28/2017 1822   BILIRUBINUR NEGATIVE 09/28/2017 1822   BILIRUBINUR neg 05/30/2017 1540   KETONESUR 5 (A) 09/28/2017 1822   PROTEINUR NEGATIVE 09/28/2017 1822   UROBILINOGEN negative (A) 05/30/2017 1540   UROBILINOGEN 0.2 03/29/2014 1516   NITRITE NEGATIVE 09/28/2017 1822   LEUKOCYTESUR TRACE (A) 09/28/2017 1822   Sepsis Labs: No results found for this or any previous visit (from the past 240 hour(s)).   Radiological Exams on Admission: Ct Abdomen Pelvis Wo Contrast  Result Date: 09/28/2017 CLINICAL DATA:  Epigastric pain. History of diabetes, gastroesophageal reflux, gastric sleeve procedure. Nausea and vomiting. Tenderness to palpation. Contrast allergy. EXAM: CT ABDOMEN  AND PELVIS WITHOUT CONTRAST TECHNIQUE: Multidetector CT imaging of the abdomen and pelvis was performed following the standard protocol without IV contrast. COMPARISON:  11/02/2016 FINDINGS: Lower chest: Lung bases are clear. Hepatobiliary: No focal liver abnormality is seen. Status post cholecystectomy. No biliary dilatation. Pancreas: There is mild infiltration around the head of the pancreas suggesting early acute pancreatitis. No loculated fluid collections. No pancreatic ductal dilatation. Spleen: Normal in size without focal abnormality. Adrenals/Urinary Tract: Adrenal glands are unremarkable. Kidneys are normal, without renal calculi, focal lesion, or hydronephrosis. Bladder is unremarkable. Stomach/Bowel: Postoperative changes consistent with history of gastric sleeve. Stomach is not abnormally distended and no wall thickening is appreciated. Small bowel are normal in caliber without abnormal wall thickening or infiltration. Scattered stool throughout the colon. No colonic distention or inflammation. Appendix is normal. Vascular/Lymphatic: No significant vascular findings are present. No enlarged abdominal or pelvic lymph nodes. Reproductive: An intrauterine device is present in the uterine fundus. Positioning appears to be appropriate. Uterus is not enlarged. No abnormal adnexal masses. Other: No abdominal wall hernia or abnormality. No abdominopelvic ascites. Musculoskeletal: No acute or significant osseous findings. IMPRESSION: 1. Mild infiltration around the head of the pancreas suggest early acute pancreatitis. No complication identified. 2. Postoperative gastric sleeve. No evidence of bowel obstruction or inflammation. 3. Intrauterine device is present. Electronically Signed   By: Lucienne Capers M.D.   On: 09/28/2017 22:39    CT abdomen pelvis without contrast: Independently reviewed.  Inflammation around the head of the pancreas observed.  Assessment/Plan Suspect early pancreatitis: Acute.   Patient presents with epigastric abdominal pain starting this morning with radiation to her back.  Lipase noted to be within normal limits, but CT imaging of the abdomen showing inflammation of the head of the pancreas.  Patient's triglyceride and calcium levels were noted to be within normal limits.  Patient does not drink alcohol, and is status post cholecystectomy since 2010.  No clear cause of symptoms at this time.  Possibly secondary to medication.  Patient was given 1.5 L of  normal saline and fentanyl for pain. - Admit to a MedSurg bed - N.p.o. - Will need to advance diet as tolerated - Bolus additional 1 L of normal saline IV fluids, then placed on rate of 125 mL/h - Fentanyl IV as needed pain - Pharmacy consult for medication review - May warrant GI consultation in a.m.  Leukocytosis: WBC elevated at 11.3 on admission.  Patient also complains of intermittent chills and sweats.  Suspect elevation could be related to inflammation seen around pancreas.  - Added on blood culture - Recheck CBC in a.m.  Nausea: Patient reports having nausea with dry heaves. - Antiemetics as needed  History of frequent UTI: Patient reports having foul-smelling urine and frequent urinary tract infections previously in the past.  Urinalysis is positive for hemoglobin, but no RBCs. - Add on CK   - Follow-up urine culture  Obesity status post sleeve gastrectomy: Patient's BMI 33.6 at this time and had gastric sleeve procedure in 02/2017.  CT imaging of the gastric sleeve appear showed no acute abnormalities.  Anxiety: At baseline patient as needed use of Valium, but has not needed this in a couple months. - Ativan IV as needed every 6 hours PRN anxiety  GERD - Pepcid IV  DVT prophylaxis: lovenox  Code Status: Full  Family Communication: Plan of care discussed with patient and family present at bedside Disposition Plan: Discharge home in 1 to 2 days Consults called: none  Admission status:  observation  Norval Morton MD [facility] Pager [phone]   If 7PM-7AM, please contact night-coverage [email or web address] Password [facility]  09/28/2017, 11:07 PM

## 2017-09-28 NOTE — ED Provider Notes (Signed)
[facility] DEPT Provider Note   CSN: [id number] Arrival date & time: 09/28/17  1754     History   Chief Complaint Chief Complaint  Patient presents with  . Abdominal Pain    HPI [name] [name] is a 37 y.o. female.  HPI   Patient is a 37 year old female resented with epigastric pain.  Patient has a history of diabetes, GERD, hypertension hyperlipidemia, pCOS, obesity, status post gastric sleeve presenting today with epigastric pain.  She reports since 3 or 4 AM she has not been able to eat much.  She feels nauseated.  She is vomited once.  Patient has tenderness to palpation in that area.  Unfortunately patient has allergy to contrast dye.    Past Medical History:  Diagnosis Date  . Abnormal uterine bleeding   . Amenorrhea   . Anemia   . Anxiety    panic attacks  . Diabetes mellitus without complication ([place])    type 2  . Dysmenorrhea   . GERD (gastroesophageal reflux disease)    occasional - diet controlled and tums prn  . Hyperlipidemia    no meds -diet controlled  . Infertility, female   . Insulin resistance   . Migraine without aura   . Missed abortion    x 2 - both resolved without surgery  . Obesity   . PCOS (polycystic ovarian syndrome)   . PCOS (polycystic ovarian syndrome)   . PONV (postoperative nausea and vomiting)    pe rpatient ; scopalamine patch very helpful   . Sleep apnea   . SVD (spontaneous vaginal delivery)    x 1  . Urinary incontinence     Patient Active Problem List   Diagnosis Date Noted  . Dehydration 03/12/2017  . 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 Sleep apnea 01/09/2017  . Morbid obesity with BMI of 45.0-49.9, adult ([place]) 01/09/2017  . Healthcare maintenance 10/18/2016  . Type 2 diabetes mellitus without complications ([place]) 62/94/7654  . LOW BACK PAIN, ACUTE 11/20/2006  . POLYCYSTIC OVARY 07/04/2006  . OBESITY, NOS 07/04/2006  . TOBACCO USE, QUIT 07/04/2006    Past  Surgical History:  Procedure Laterality Date  . CHOLECYSTECTOMY    . COLONOSCOPY  2017   polyps  . CYSTOSCOPY N/A 10/29/2016   Procedure: CYSTOSCOPY;  Surgeon: Salvadore Dom, MD;  Location: [facility];  Service: Gynecology;  Laterality: N/A;  . DILATATION & CURRETTAGE/HYSTEROSCOPY WITH RESECTOCOPE N/A 10/17/2012   Procedure: DILATATION & CURETTAGE/HYSTEROSCOPY WITH RESECTOCOPE; Possible Polypectomy, Possible Resectoscopic Myomectomy.;  Surgeon: Floyce Stakes. Pamala Hurry, MD;  Location: [facility];  Service: Gynecology;  Laterality: N/A;  1 hr.  Marland Kitchen DILATION AND CURETTAGE OF UTERUS    . ENDOMETRIAL BIOPSY    . ESOPHAGOGASTRODUODENOSCOPY ENDOSCOPY  2017   Normal  . HYSTEROSCOPY W/D&C N/A 12/01/2013   Procedure: IUD Removal;  Surgeon: Claiborne Billings A. Pamala Hurry, MD;  Location: [facility];  Service: Gynecology;  Laterality: N/A;  90 min.  . INTRAUTERINE DEVICE (IUD) INSERTION N/A 10/17/2012   Procedure: INTRAUTERINE DEVICE (IUD) INSERTION; Mirena;  Surgeon: Floyce Stakes. Pamala Hurry, MD;  Location: [facility];  Service: Gynecology;  Laterality: N/A;  . LAPAROSCOPIC GASTRIC SLEEVE RESECTION N/A 02/19/2017   Procedure: LAPAROSCOPIC GASTRIC SLEEVE RESECTION, UPPER ENDOSCOPY;  Surgeon: Johnathan Hausen, MD;  Location: [facility];  Service: General;  Laterality: N/A;  . LAPAROSCOPIC SALPINGO OOPHERECTOMY Left 10/29/2016   Procedure: LAPAROSCOPIC SALPINGO OOPHORECTOMY With anterior uterine biopsy;  Surgeon: Salvadore Dom, MD;  Location: [facility];  Service: Gynecology;  Laterality: Left;  . MOUTH SURGERY     wisdom teeth     OB History    Gravida  3   Para  1   Term  1   Preterm      AB  2   Living  1     SAB  2   TAB      Ectopic      Multiple      Live Births  1            Home Medications    Prior to Admission medications   Medication Sig Start Date End Date Taking? Authorizing Provider  blood glucose meter kit and supplies KIT Dispense based on patient and insurance preference. Use up to four times daily  as directed. (FOR ICD-9 250.00, 250.01). 10/18/16   Danford, Valetta Fuller D, NP  citalopram (CELEXA) 20 MG tablet Take 1/2 a tablet a day for 1 week , if tolerating well, increase to one tablet a day. 05/30/17   Salvadore Dom, MD  cyclobenzaprine (FLEXERIL) 10 MG tablet Take 1 tablet (10 mg total) by mouth 3 (three) times daily as needed for muscle spasms. 07/30/17   Danford, Valetta Fuller D, NP  diazepam (VALIUM) 5 MG tablet Take 1 tablet (5 mg total) by mouth every 8 (eight) hours as needed for anxiety. 10/18/16   Danford, Valetta Fuller D, NP  levonorgestrel (MIRENA) 20 MCG/24HR IUD 1 each by Intrauterine route once.    [provider]  Multiple Vitamins-Iron (MULTIVITAMINS WITH IRON) TABS tablet Take 1 tablet by mouth 2 (two) times daily. CHEWABLE    [provider]  nystatin (MYCOSTATIN/NYSTOP) powder Apply topically 4 (four) times daily. 02/22/17   Opalski, Deborah, DO  nystatin cream (MYCOSTATIN) Apply 1 application topically 2 (two) times daily. Apply to affected area BID for up to 7 days. 05/30/17   Salvadore Dom, MD  ondansetron (ZOFRAN) 4 MG tablet as needed. 04/12/17   [provider]  OVER THE COUNTER MEDICATION Take 1 tablet by mouth 3 (three) times daily. Bariatric calcium '500mg'$     [provider]  pantoprazole (PROTONIX) 40 MG tablet  02/07/17   [provider]  phenazopyridine (PYRIDIUM) 200 MG tablet Take 1 tablet (200 mg total) by mouth 3 (three) times daily as needed. 08/12/17   Salvadore Dom, MD  predniSONE (DELTASONE) 20 MG tablet 1 tab every 12 hrs for 3 days. 1 tab daily for 3 days. 1/2 tab daily for 3 days 07/30/17   [name] D, NP  Probiotic Product (DIGESTIVE ADVANTAGE GUMMIES) CHEW Chew 1 each by mouth 3 (three) times daily.     [provider]  sulfamethoxazole-trimethoprim (BACTRIM DS) 800-160 MG tablet Take 1 tablet by mouth 2 (two) times daily. One PO BID x 3 days 08/12/17   Salvadore Dom, MD    Family History Family  History  Problem Relation Age of Onset  . Diabetes Father   . Thyroid disease Father   . Hypertension Father   . Diabetes Maternal Grandmother   . Heart disease Maternal Grandmother   . Diabetes Maternal Grandfather   . Heart disease Maternal Grandfather   . Stroke Maternal Grandfather   . Diabetes Paternal Grandmother   . Diabetes Paternal Grandfather   . Heart disease Paternal Grandfather   . Alcohol abuse Paternal Grandfather   . Diabetes Other   . Obesity Other   . Sleep apnea Other   . Hyperlipidemia Other   .  Hypertension Other   . Cancer Other   . Heart disease Other   . Hypertension Mother     Social History Social History   Tobacco Use  . Smoking status: Former Smoker    Packs/day: 0.25    Years: 19.00    Pack years: 4.75    Types: Cigarettes    Last attempt to quit: 06/06/2016    Years since quitting: 1.3  . Smokeless tobacco: Never Used  Substance Use Topics  . Alcohol use: No    Frequency: Never  . Drug use: No     Allergies   Iohexol   Review of Systems Review of Systems  Constitutional: Negative for activity change, fatigue and fever.  Respiratory: Negative for shortness of breath.   Cardiovascular: Negative for chest pain.  Gastrointestinal: Positive for abdominal pain and nausea.  Genitourinary: Negative for dyspareunia, dysuria, flank pain and pelvic pain.  Musculoskeletal: Negative for back pain.  All other systems reviewed and are negative.    Physical Exam Updated Vital Signs BP 121/69 (BP Location: Right Arm)   Pulse (!) 59   Temp 98.2 F (36.8 C) (Oral)   Resp 18   LMP  (LMP Unknown)   SpO2 100%   Physical Exam  Constitutional: She is oriented to person, place, and time. She appears well-developed and well-nourished.  HENT:  Head: Normocephalic and atraumatic.  Eyes: Right eye exhibits no discharge.  Cardiovascular: Normal rate, regular rhythm and normal heart sounds.  No murmur heard. Pulmonary/Chest: Effort normal and  breath sounds normal. She has no wheezes. She has no rales.  Abdominal: Soft. Normal appearance. She exhibits no distension. There is tenderness in the epigastric area.  Neurological: She is oriented to person, place, and time.  Skin: Skin is warm and dry. She is not diaphoretic.  Psychiatric: She has a normal mood and affect.  Nursing note and vitals reviewed.    ED Treatments / Results  Labs (all labs ordered are listed, but only abnormal results are displayed) Labs Reviewed  COMPREHENSIVE METABOLIC PANEL - Abnormal; Notable for the following components:      Result Value   Total Protein 8.2 (*)    ALT 13 (*)    All other components within normal limits  CBC - Abnormal; Notable for the following components:   WBC 11.3 (*)    All other components within normal limits  URINALYSIS, ROUTINE W REFLEX MICROSCOPIC - Abnormal; Notable for the following components:   Hgb urine dipstick MODERATE (*)    Ketones, ur 5 (*)    Leukocytes, UA TRACE (*)    Bacteria, UA RARE (*)    All other components within normal limits  LIPASE, BLOOD  I-STAT BETA HCG BLOOD, ED ([facility], [facility], [facility] ONLY)    EKG None  Radiology No results found.  Procedures Procedures (including critical care time)  Medications Ordered in ED Medications - No data to display   Initial Impression / Assessment and Plan / ED Course  I have reviewed the triage vital signs and the nursing notes.  Pertinent labs & imaging results that were available during my care of the patient were reviewed by me and considered in my medical decision making (see chart for details).     Patient is a 37 year old female resented with epigastric pain.  Patient has a history of diabetes, GERD, hypertension hyperlipidemia, pCOS, obesity, status post gastric sleeve presenting today with epigastric pain.  She reports since 3 or 4 AM she has not been able  to eat much.  She feels nauseated.  She is vomited once.  Patient has tenderness to palpation in  that area.  Unfortunately patient has allergy to contrast dye.  9:22 PM Patient has severe epigastric tenderness on exam, however not peritoneal.  Patient is already had a cholecystectomy.  Given that she had gastric sleeve roughly a year ago, will get CAT scan.  10:54 PM CT shows early pancreatitis.  Patient's pancreatic enzymes are still normal.  Patient has history of cholecystectomy.  Discussed with Dr. Lucia Gaskins, on for surgery.  Patient has no real cause of his pancreatitis, no history of drinking, no medications that would cause it.  Dr. Lucia Gaskins does not think is related to her gastric sleeve.  Will send triglycerides.  Patient really unable to tolerate the pain or orals, already ketones in your urine, will admit to medicine for further evaluation and treatment.   Final Clinical Impressions(s) / ED Diagnoses   Final diagnoses:  None    ED Discharge Orders    None       Macarthur Critchley, MD 09/28/17 2319

## 2017-09-29 ENCOUNTER — Other Ambulatory Visit: Payer: Self-pay

## 2017-09-29 ENCOUNTER — Encounter (HOSPITAL_COMMUNITY): Payer: Self-pay

## 2017-09-29 DIAGNOSIS — Z79899 Other long term (current) drug therapy: Secondary | ICD-10-CM | POA: Diagnosis not present

## 2017-09-29 DIAGNOSIS — R110 Nausea: Secondary | ICD-10-CM | POA: Diagnosis present

## 2017-09-29 DIAGNOSIS — K8500 Idiopathic acute pancreatitis without necrosis or infection: Secondary | ICD-10-CM

## 2017-09-29 DIAGNOSIS — E669 Obesity, unspecified: Secondary | ICD-10-CM | POA: Diagnosis present

## 2017-09-29 DIAGNOSIS — Z9884 Bariatric surgery status: Secondary | ICD-10-CM | POA: Diagnosis not present

## 2017-09-29 DIAGNOSIS — Z888 Allergy status to other drugs, medicaments and biological substances status: Secondary | ICD-10-CM | POA: Diagnosis not present

## 2017-09-29 DIAGNOSIS — Z8349 Family history of other endocrine, nutritional and metabolic diseases: Secondary | ICD-10-CM | POA: Diagnosis not present

## 2017-09-29 DIAGNOSIS — Z833 Family history of diabetes mellitus: Secondary | ICD-10-CM | POA: Diagnosis not present

## 2017-09-29 DIAGNOSIS — R51 Headache: Secondary | ICD-10-CM | POA: Diagnosis present

## 2017-09-29 DIAGNOSIS — Z793 Long term (current) use of hormonal contraceptives: Secondary | ICD-10-CM | POA: Diagnosis not present

## 2017-09-29 DIAGNOSIS — F410 Panic disorder [episodic paroxysmal anxiety] without agoraphobia: Secondary | ICD-10-CM | POA: Diagnosis present

## 2017-09-29 DIAGNOSIS — F419 Anxiety disorder, unspecified: Secondary | ICD-10-CM | POA: Diagnosis present

## 2017-09-29 DIAGNOSIS — K859 Acute pancreatitis without necrosis or infection, unspecified: Secondary | ICD-10-CM | POA: Diagnosis present

## 2017-09-29 DIAGNOSIS — E282 Polycystic ovarian syndrome: Secondary | ICD-10-CM | POA: Diagnosis present

## 2017-09-29 DIAGNOSIS — E785 Hyperlipidemia, unspecified: Secondary | ICD-10-CM | POA: Diagnosis present

## 2017-09-29 DIAGNOSIS — Z8249 Family history of ischemic heart disease and other diseases of the circulatory system: Secondary | ICD-10-CM | POA: Diagnosis not present

## 2017-09-29 DIAGNOSIS — Z87440 Personal history of urinary (tract) infections: Secondary | ICD-10-CM | POA: Diagnosis not present

## 2017-09-29 DIAGNOSIS — K219 Gastro-esophageal reflux disease without esophagitis: Secondary | ICD-10-CM | POA: Diagnosis present

## 2017-09-29 DIAGNOSIS — Z87891 Personal history of nicotine dependence: Secondary | ICD-10-CM | POA: Diagnosis not present

## 2017-09-29 DIAGNOSIS — Z6833 Body mass index (BMI) 33.0-33.9, adult: Secondary | ICD-10-CM | POA: Diagnosis not present

## 2017-09-29 DIAGNOSIS — Z823 Family history of stroke: Secondary | ICD-10-CM | POA: Diagnosis not present

## 2017-09-29 DIAGNOSIS — Z9049 Acquired absence of other specified parts of digestive tract: Secondary | ICD-10-CM | POA: Diagnosis not present

## 2017-09-29 DIAGNOSIS — Z7952 Long term (current) use of systemic steroids: Secondary | ICD-10-CM | POA: Diagnosis not present

## 2017-09-29 DIAGNOSIS — D72829 Elevated white blood cell count, unspecified: Secondary | ICD-10-CM | POA: Diagnosis present

## 2017-09-29 LAB — CBC
HEMATOCRIT: 38.1 % (ref 36.0–46.0)
HEMOGLOBIN: 12.9 g/dL (ref 12.0–15.0)
MCH: 28.6 pg (ref 26.0–34.0)
MCHC: 33.9 g/dL (ref 30.0–36.0)
MCV: 84.5 fL (ref 78.0–100.0)
Platelets: 219 10*3/uL (ref 150–400)
RBC: 4.51 MIL/uL (ref 3.87–5.11)
RDW: 12.6 % (ref 11.5–15.5)
WBC: 10.1 10*3/uL (ref 4.0–10.5)

## 2017-09-29 LAB — COMPREHENSIVE METABOLIC PANEL
ALK PHOS: 70 U/L (ref 38–126)
ALT: 10 U/L — ABNORMAL LOW (ref 14–54)
ANION GAP: 12 (ref 5–15)
AST: 13 U/L — ABNORMAL LOW (ref 15–41)
Albumin: 4 g/dL (ref 3.5–5.0)
BILIRUBIN TOTAL: 0.7 mg/dL (ref 0.3–1.2)
BUN: 15 mg/dL (ref 6–20)
CALCIUM: 8.9 mg/dL (ref 8.9–10.3)
CO2: 23 mmol/L (ref 22–32)
Chloride: 106 mmol/L (ref 101–111)
Creatinine, Ser: 0.87 mg/dL (ref 0.44–1.00)
GFR calc Af Amer: >60 mL/min (ref >60)
Glucose, Bld: 92 mg/dL (ref 65–99)
POTASSIUM: 3.4 mmol/L — AB (ref 3.5–5.1)
Sodium: 141 mmol/L (ref 135–145)
TOTAL PROTEIN: 7.3 g/dL (ref 6.5–8.1)

## 2017-09-29 LAB — TSH: TSH: 1.484 u[IU]/mL (ref 0.350–4.500)

## 2017-09-29 LAB — CK: Total CK: 54 U/L (ref 38–234)

## 2017-09-29 LAB — HIV ANTIBODY (ROUTINE TESTING W REFLEX): HIV Screen 4th Generation wRfx: NONREACTIVE

## 2017-09-29 MED ORDER — POTASSIUM CHLORIDE 10 MEQ/100ML IV SOLN
10.0000 meq | INTRAVENOUS | Status: AC
  Administered 2017-09-29: 10 meq via INTRAVENOUS
  Filled 2017-09-29: qty 100

## 2017-09-29 MED ORDER — FAMOTIDINE IN NACL 20-0.9 MG/50ML-% IV SOLN
20.0000 mg | Freq: Two times a day (BID) | INTRAVENOUS | Status: DC
  Administered 2017-09-29 – 2017-10-02 (×8): 20 mg via INTRAVENOUS
  Filled 2017-09-29 (×8): qty 50

## 2017-09-29 MED ORDER — OXYCODONE HCL 5 MG PO TABS
5.0000 mg | ORAL_TABLET | ORAL | Status: DC | PRN
  Administered 2017-09-29 – 2017-09-30 (×5): 5 mg via ORAL
  Filled 2017-09-29 (×5): qty 1

## 2017-09-29 MED ORDER — FENTANYL CITRATE (PF) 100 MCG/2ML IJ SOLN
25.0000 ug | INTRAMUSCULAR | Status: DC | PRN
  Administered 2017-09-30 – 2017-10-01 (×2): 25 ug via INTRAVENOUS
  Filled 2017-09-29 (×2): qty 2

## 2017-09-29 NOTE — Progress Notes (Signed)
[name], female   DOB: 10/15/1980, 37 y.o.   MRN: [id number]  PROGRESS NOTE    Marylene Masek [facility]  KXF:[id number] DOB: [birth year] DOA: 09/28/2017 PCP: Esaw Grandchild, NP   Brief Narrative:  37 year old female with history of diabetes mellitus type 2, anxiety, status post sleeve gastrectomy in October 2018 for morbid obesity and status post cholecystectomy in 2010 presented with abdominal pain on 09/28/2017.  CT of the abdomen pelvis revealed early signs for pancreatitis.  She was started on IV fluids.  General surgery was also consulted for history of sleeve gastrectomy.   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  -questionable cause.  Triglycerides normal.  No history of alcohol use.  Status post cholecystectomy in the past -Spoke to Dr. Joan Flores about the patient and he will see the patient in consultation -Continue IV fluids along with pain management and antiemetics.  Clear liquid diet to start tonight if patient improves.   Leukocytosis -Probably reactive.  Resolved  Status post sleeve gastrectomy for morbid obesity -General surgery following  Anxiety -Ativan PRN  GERD -Pepcid   DVT prophylaxis: Lovenox Code Status: Full Family Communication: None at bedside Disposition Plan: Home in 2 to 3 days if clinically improves  Consultants: General surgery/GI  Procedures: None  Antimicrobials: None   Subjective: Patient seen and examined at bedside.  She feels slightly better, current pain is 4-5 out of 10.  No current nausea.  No overnight vomiting.  Objective: Vitals:   09/28/17 2201 09/29/17 0001 09/29/17 0037 09/29/17 0532  BP: 116/70 (!) 114/53 (!) 116/53 (!) 131/109  Pulse: (!) 50 (!) 48 72 74  Resp: 16 17 20 16   Temp:   98.2 F (36.8 C) 97.8 F (36.6 C)  TempSrc:    Oral Oral  SpO2: 100% 100% 100% 100%  Weight:      Height:        Intake/Output Summary (Last 24 hours) at 09/29/2017 1018 Last data filed at 09/29/2017 0532 Gross per 24 hour  Intake 1500 ml  Output -  Net 1500 ml   Filed Weights   09/28/17 2138  Weight: 100.2 kg (221 lb)    Examination:  General exam: Appears calm and comfortable  Respiratory system: Bilateral decreased breath sound at bases Cardiovascular system: S1 & S2 heard, rate controlled.  Gastrointestinal system: Abdomen is nondistended, soft and mild epigastric and periumbilical tenderness. Normal bowel sounds heard. Central nervous system: Alert and oriented. No focal neurological deficits. Moving extremities Extremities: No cyanosis, clubbing, edema    Data Reviewed: I have personally reviewed following labs and imaging studies  CBC: Recent Labs  Lab 09/28/17 1831 09/29/17 0152  WBC 11.3* 10.1  HGB 14.6 12.9  HCT 42.6 38.1  MCV 85.9 84.5  PLT 272 696   Basic Metabolic Panel: Recent Labs  Lab 09/28/17 1831 09/29/17 0152  NA 140 141  K 4.2 3.4*  CL 105 106  CO2 26 23  GLUCOSE 98 92  BUN 17 15  CREATININE 0.84 0.87  CALCIUM 9.8 8.9   GFR: Estimated Creatinine Clearance: 110.6 mL/min (by C-G formula based on SCr of 0.87 mg/dL). Liver Function Tests: Recent Labs  Lab 09/28/17 1831 09/29/17 0152  AST 15 13*  ALT 13* 10*  ALKPHOS 82 70  BILITOT 0.9 0.7  PROT 8.2* 7.3  ALBUMIN 4.6 4.0   Recent Labs  Lab 09/28/17 1831  LIPASE 32   No results for input(s): AMMONIA in the last 168 hours. Coagulation Profile: No results for input(s): INR, PROTIME in the last 168 hours. Cardiac Enzymes: Recent Labs  Lab 09/29/17 0152  CKTOTAL 54   BNP (last 3 results) No results for input(s): PROBNP in the last 8760 hours. HbA1C: No results for input(s): HGBA1C in the last 72 hours. CBG: No results for input(s): GLUCAP in the last 168 hours. Lipid Profile: Recent Labs    09/28/17 1822  TRIG 93    Thyroid Function Tests: Recent Labs    09/29/17 0152  TSH 1.484   Anemia Panel: No results for input(s): VITAMINB12, FOLATE, FERRITIN, TIBC, IRON, RETICCTPCT in the last 72 hours. Sepsis Labs: No results for input(s): PROCALCITON, LATICACIDVEN in the last 168 hours.  No results found for this or any previous visit (from the past 240 hour(s)).       Radiology Studies: Ct Abdomen Pelvis Wo Contrast  Result Date: 09/28/2017 CLINICAL DATA:  Epigastric pain. History of diabetes, gastroesophageal reflux, gastric sleeve procedure. Nausea and vomiting. Tenderness to palpation. Contrast allergy. EXAM: CT ABDOMEN AND PELVIS WITHOUT CONTRAST TECHNIQUE: Multidetector CT imaging of the abdomen and pelvis was performed following the standard protocol without IV contrast. COMPARISON:  11/02/2016 FINDINGS: Lower chest: Lung bases are clear. Hepatobiliary: No focal liver abnormality is seen. Status post cholecystectomy. No biliary dilatation. Pancreas: There is mild infiltration around the head of the pancreas suggesting early acute pancreatitis. No loculated fluid collections. No pancreatic ductal dilatation. Spleen: Normal in size without focal abnormality. Adrenals/Urinary Tract: Adrenal glands are unremarkable. Kidneys are normal, without renal calculi, focal lesion, or hydronephrosis. Bladder is unremarkable. Stomach/Bowel: Postoperative changes consistent with history of gastric sleeve. Stomach is not abnormally distended and no wall thickening is appreciated. Small bowel are normal in caliber without abnormal wall thickening or infiltration. Scattered stool throughout the colon. No colonic distention or inflammation. Appendix is normal. Vascular/Lymphatic: No significant vascular findings are present. No enlarged abdominal or pelvic lymph nodes. Reproductive: An intrauterine device is present in the uterine fundus. Positioning appears to be appropriate. Uterus is not enlarged. No abnormal adnexal  masses. Other: No abdominal wall hernia or abnormality. No abdominopelvic ascites. Musculoskeletal: No acute or significant osseous findings. IMPRESSION: 1. Mild infiltration around the head of the pancreas suggest early acute pancreatitis. No complication identified. 2. Postoperative gastric sleeve. No evidence of bowel obstruction or inflammation. 3. Intrauterine device is present. Electronically Signed   By: Lucienne Capers M.D.   On: 09/28/2017 22:39        Scheduled Meds: . enoxaparin (LOVENOX) injection  40 mg Subcutaneous QHS  . sodium chloride flush  3 mL Intravenous Q12H   Continuous Infusions: . sodium chloride 125 mL/hr at 09/29/17 0119  . famotidine (PEPCID) IV 20 mg (09/29/17 0119)  . sodium chloride       LOS: 0 days        Aline August, MD [facility] Pager [phone]  If 7PM-7AM, please contact night-coverage [email or web address] Password [facility] 09/29/2017, 10:18 AM

## 2017-09-29 NOTE — Progress Notes (Signed)
Pt to unit via stretcher from ER. Pt ambulated from stretcher to restroom in assigned room and to bed. Mother at patient's bedside.

## 2017-09-29 NOTE — Progress Notes (Signed)
MEDICATION RELATED CONSULT NOTE - INITIAL   Pharmacy Consult for medications which increase risk/cause pancreatitis Indication: New onset pancreatitis    Patient Measurements: Height: 5\' 8"  (172.7 cm) Weight: 221 lb (100.2 kg) IBW/kg (Calculated) : 63.9 Adjusted Body Weight:   Vital Signs: Temp: 98.2 F (36.8 C) (05/25 1813) Temp Source: Oral (05/25 1813) BP: 116/70 (05/25 2201) Pulse Rate: 48 (05/26 0001) Intake/Output from previous day: 05/25 0701 - 05/26 0700 In: 500 [IV Piggyback:500] Out: -  Intake/Output from this shift: Total I/O In: 500 [IV Piggyback:500] Out: -   Labs: Recent Labs    09/28/17 1831  WBC 11.3*  HGB 14.6  HCT 42.6  PLT 272  CREATININE 0.84  ALBUMIN 4.6  PROT 8.2*  AST 15  ALT 13*  ALKPHOS 82  BILITOT 0.9   Estimated Creatinine Clearance: 114.6 mL/min (by C-G formula based on SCr of 0.84 mg/dL).   Microbiology: No results found for this or any previous visit (from the past 720 hour(s)).  Medical History: Past Medical History:  Diagnosis Date  . Abnormal uterine bleeding   . Amenorrhea   . Anemia   . Anxiety    panic attacks  . Diabetes mellitus without complication ([place])    type 2  . Dysmenorrhea   . GERD (gastroesophageal reflux disease)    occasional - diet controlled and tums prn  . Hyperlipidemia    no meds -diet controlled  . Infertility, female   . Insulin resistance   . Migraine without aura   . Missed abortion    x 2 - both resolved without surgery  . Obesity   . PCOS (polycystic ovarian syndrome)   . PCOS (polycystic ovarian syndrome)   . PONV (postoperative nausea and vomiting)    pe rpatient ; scopalamine patch very helpful   . Sleep apnea   . SVD (spontaneous vaginal delivery)    x 1  . Urinary incontinence     Medications:   (Not in a hospital admission) Scheduled:  . enoxaparin (LOVENOX) injection  40 mg Subcutaneous Q24H  . sodium chloride flush  3 mL Intravenous Q12H    Assessment: Patient  with pancreatitis in ED. MD request pharmacy evaluate prior to admission meds for pancreatitis risk.    Goal of Therapy:  Evaluate prior to admission meds for pancreatitis risk  Plan:  While trimethoprim=sulfamethoxazole does have a documented risk for pancreatitis, the patient appears to have completed course > 96month prior to symptom onset, therefore unlikely to be cause.  I did not note other meds in my review of med rec to have pancreatitis as risk factor.  Tyler Deis, Shea Stakes [place] 09/29/2017,12:02 AM

## 2017-09-29 NOTE — Consult Note (Signed)
Re:   [name] [name] DOB:   Jul 09, 1980 MRN:   [id number]   General Surgery consultation  Chief Complaint Abdominal pain  ASSESEMENT AND PLAN: 1.  Pancreatitis by CT scan  Normal lipase ??  Etiology unclear - she does not drink and her gall bladder was removed remotely.  Consider more obscure causes of pancreatitis.  Ca++ and triglycerides are normal.  Would consult GI for input.  May need endoscopic [place] or MRCP at some point, if etiology remains unclear.  She could start clear liquids.  2.  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  I think that the changes in the pancreas are unrelated to the gastric sleeve surgery. 3.  DM - resolved with weight loss 4.  History of anxiety 5.  Sleep apnea - resolved with weight loss 6.  PCOS  Chief Complaint  Patient presents with  . Abdominal Pain   PHYSICIAN REQUESTING CONSULTATION: Esaw Grandchild, NP  HISTORY OF PRESENT ILLNESS: [name] [name] is a 37 y.o. (DOB: 20-Jan-1981)  white female whose primary care physician is Danford, Berna Spare, NP. [facility]) Her husband and son are asleep in the room.  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 Hassell Done for morbid obesity. She has done well with her weight going from about 290 pounds to about 220 pounds. She has seen resolution of her diabetes and her sleep apnea.  She developed acute abdominal pain that began about 3 AM yesterday. She's had no prior episodes of abdominal pain. She had not eaten anything unusual.  She does not drink alcohol. She had a cholecystectomy by Dr. Zella Richer around 2010. She came to the [facility] emergency room.  She had removal of a left tube/ovary by Dr. Nelson Chimes in June 2018.  She denies a history of liver disease or colon disease.  CT scan of abdomen/pelvis - 09/28/2017 - 1. Mild infiltration around the head of the pancreas suggest early acute pancreatitis. No complication  identified.  2. Postoperative gastric sleeve. No evidence of bowel obstruction or inflammation.  3. Intrauterine device is present. Lipase - 32 - 09/28/2017   Past Medical History:  Diagnosis Date  . Abnormal uterine bleeding   . Amenorrhea   . Anemia   . Anxiety    panic attacks  . Diabetes mellitus without complication ([place])    type 2  . Dysmenorrhea   . GERD (gastroesophageal reflux disease)    occasional - diet controlled and tums prn  . Hyperlipidemia    no meds -diet controlled  . Infertility, female   . Insulin resistance   . Migraine without aura   . Missed abortion    x 2 - both resolved without surgery  . Obesity   . PCOS (polycystic ovarian syndrome)   . PCOS (polycystic ovarian syndrome)   . PONV (postoperative nausea and vomiting)    pe rpatient ; scopalamine patch very helpful   . Sleep apnea   . SVD (spontaneous vaginal delivery)    x 1  . Urinary incontinence       Past Surgical History:  Procedure Laterality Date  . CHOLECYSTECTOMY    . COLONOSCOPY  2017   polyps  . CYSTOSCOPY N/A 10/29/2016   Procedure: CYSTOSCOPY;  Surgeon: Salvadore Dom, MD;  Location: [facility];  Service: Gynecology;  Laterality: N/A;  . DILATATION & CURRETTAGE/HYSTEROSCOPY WITH RESECTOCOPE N/A 10/17/2012   Procedure: DILATATION &  CURETTAGE/HYSTEROSCOPY WITH RESECTOCOPE; Possible Polypectomy, Possible Resectoscopic Myomectomy.;  Surgeon: Floyce Stakes. Pamala Hurry, MD;  Location: [facility];  Service: Gynecology;  Laterality: N/A;  1 hr.  Marland Kitchen DILATION AND CURETTAGE OF UTERUS    . ENDOMETRIAL BIOPSY    . ESOPHAGOGASTRODUODENOSCOPY ENDOSCOPY  2017   Normal  . HYSTEROSCOPY W/D&C N/A 12/01/2013   Procedure: IUD Removal;  Surgeon: Claiborne Billings A. Pamala Hurry, MD;  Location: [facility];  Service: Gynecology;  Laterality: N/A;  90 min.  . INTRAUTERINE DEVICE (IUD) INSERTION N/A 10/17/2012   Procedure: INTRAUTERINE DEVICE (IUD) INSERTION; Mirena;  Surgeon: Floyce Stakes. Pamala Hurry, MD;  Location: [facility];  Service:  Gynecology;  Laterality: N/A;  . LAPAROSCOPIC GASTRIC SLEEVE RESECTION N/A 02/19/2017   Procedure: LAPAROSCOPIC GASTRIC SLEEVE RESECTION, UPPER ENDOSCOPY;  Surgeon: Johnathan Hausen, MD;  Location: [facility];  Service: General;  Laterality: N/A;  . LAPAROSCOPIC SALPINGO OOPHERECTOMY Left 10/29/2016   Procedure: LAPAROSCOPIC SALPINGO OOPHORECTOMY With anterior uterine biopsy;  Surgeon: Salvadore Dom, MD;  Location: [facility];  Service: Gynecology;  Laterality: Left;  . MOUTH SURGERY     wisdom teeth      Current Facility-Administered Medications  Medication Dose Route Frequency Provider Last Rate Last Dose  . 0.9 %  sodium chloride infusion   Intravenous Continuous Fuller Plan A, MD 125 mL/hr at 09/29/17 0119    . acetaminophen (TYLENOL) tablet 650 mg  650 mg Oral Q6H PRN Norval Morton, MD   650 mg at 09/29/17 0250   Or  . acetaminophen (TYLENOL) suppository 650 mg  650 mg Rectal Q6H PRN Tamala Julian, Rondell A, MD      . albuterol (PROVENTIL) (2.5 MG/3ML) 0.083% nebulizer solution 2.5 mg  2.5 mg Nebulization Q6H PRN Smith, Rondell A, MD      . enoxaparin (LOVENOX) injection 40 mg  40 mg Subcutaneous QHS Smith, Rondell A, MD      . famotidine (PEPCID) IVPB 20 mg premix  20 mg Intravenous Q12H Smith, Rondell A, MD 100 mL/hr at 09/29/17 0119 20 mg at 09/29/17 0119  . fentaNYL (SUBLIMAZE) injection 25 mcg  25 mcg Intravenous Q3H PRN Fuller Plan A, MD   25 mcg at 09/29/17 0537  . LORazepam (ATIVAN) injection 0.5 mg  0.5 mg Intravenous Q6H PRN Tamala Julian, Rondell A, MD      . ondansetron (ZOFRAN) tablet 4 mg  4 mg Oral Q6H PRN Fuller Plan A, MD       Or  . ondansetron (ZOFRAN) injection 4 mg  4 mg Intravenous Q6H PRN Fuller Plan A, MD   4 mg at 09/29/17 0250  . potassium chloride 10 mEq in 100 mL IVPB  10 mEq Intravenous Q1 Hr x 2 Fuller Plan A, MD 100 mL/hr at 09/29/17 0637 10 mEq at 09/29/17 0637  . sodium chloride 0.9 % bolus 1,000 mL  1,000 mL Intravenous Once Smith, Rondell A, MD      .  sodium chloride flush (NS) 0.9 % injection 3 mL  3 mL Intravenous Q12H Smith, Rondell A, MD        REVIEW OF SYSTEMS: Skin:  No history of rash.  No history of abnormal moles. Infection:  No history of hepatitis or HIV.  No history of MRSA. Neurologic:  No history of stroke.  No history of seizure.  No history of headaches. Cardiac:  No history of hypertension. No history of heart disease.   No history of seeing a cardiologist. Pulmonary:  Sleep apnea improved with weight loss.  Off CPAP  Endocrine:  DM improved with weight loss No thyroid disease. Gastrointestinal:  See HPI Urologic:  No history of kidney stones.  No history of bladder infections. GYN:  History of left oophorectomy/fallopian tube removal - 10/2016 - Dr. Sumner Boast.  Uterine perf by IUC - 12/01/2013. Musculoskeletal:  No history of joint or back disease. Hematologic:  No bleeding disorder.  No history of anemia.  Not anticoagulated. Psycho-social:  The patient is oriented.   History of anxiety.  SOCIAL and FAMILY HISTORY: Married.  Husband, Linna Hoff, asleep in room. She does not work.  No family history of Pancreatitis.  PHYSICAL EXAM: BP (!) 131/109 (BP Location: Right Arm)   Pulse 74   Temp 97.8 F (36.6 C) (Oral)   Resp 16   Ht 5\' 8"  (1.727 m)   Wt 100.2 kg (221 lb)   LMP  (LMP Unknown)   SpO2 100%   BMI 33.60 kg/m   General: Obese WF who is alert. Skin:  Inspection and palpation - no mass or rash. Eyes:  Conjunctiva and lids unremarkable.            Pupils are equal Ears, Nose, Mouth, and Throat:  Ears and nose unremarkable            Lips and teeth are unremarable. Neck: Supple. No mass, trachea midline.  No thyroid mass. Lymph Nodes:  No supraclavicular, cervical nodes  Lungs: Normal respiratory effort.  Clear to auscultation and symmetric breath sounds. Heart:  Palpation of the heart is normal.            Auscultation: RRR. No murmur or rub.  Abdomen: Soft. No mass. No tenderness. No hernia.               Few bowel sounds.    Sore epigastrium, no peritoneal sxes. Rectal: Not done. Musculoskeletal:  Pain in left arm.  She is getting a K+ infusion which is hurting her. Neurologic:  Grossly intact to motor and sensory function. Psychiatric: Normal judgement and insight. Behavior is normal.            Oriented to time, person, place.   DATA REVIEWED, COUNSELING AND COORDINATION OF CARE: Epic notes reviewed. Counseling and coordination of care exceeded more than 50% of the time spent with patient. Total time spent with patient and charting: [place], MD,  [facility], [place]  [place], [place]    [place] Phone:  [phone] FAX:  [phone]

## 2017-09-29 NOTE — Consult Note (Addendum)
Referring Provider:  Dr. Starla Link Primary Care Physician:  Esaw Grandchild, NP Primary Gastroenterologist:  Althia Forts  Reason for Consultation:  Pancreatitis  HPI: [name] [name] is a 37 y.o. 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   She presented to [facility] ED with acute epigastric abdominal pain started around 3 AM on the morning of admission.  She thought symptoms may be related to acid reflux when it first started.  Patient describes pain initially as dull ache, but then changed to be more stabbing/burning like.  Pain mid-periumbilical and into her epigastrium.  Associated with nausea but no vomiting.  Never had pain like this in the past.  She is on bariatric multivitamins, calcium chews, and Protonix prn following her gastric sleeve in 02/2017.   ED Course: Upon admission to the emergency department patient was noted to be afebrile, pulse 50-81, and all other vital signs relatively within normal limits.  Labs revealed WBC 11, lipase 32, and triglyceride level 98, LFT's normal.  Urinalysis was negative for significant signs of infection.  CT scan of the abdomen and pelvis with oral contrast only showed the following:  IMPRESSION: 1. Mild infiltration around the head of the pancreas suggest early acute pancreatitis. No complication identified. 2. Postoperative gastric sleeve. No evidence of bowel obstruction or inflammation. 3. Intrauterine device is present.   Past Medical History:  Diagnosis Date  . Abnormal uterine bleeding   . Amenorrhea   . Anemia   . Anxiety    panic attacks  . Diabetes mellitus without complication ([place])    type 2 ---5/26 no loner in pre diabetic range since bariatric sx 6 months ago  . Dysmenorrhea   . GERD (gastroesophageal reflux disease)    occasional - diet controlled and tums prn  . Hyperlipidemia    no meds -diet controlled  .  Infertility, female   . Insulin resistance   . Migraine without aura   . Missed abortion    x 2 - both resolved without surgery  . Obesity   . PCOS (polycystic ovarian syndrome)   . PCOS (polycystic ovarian syndrome)   . PONV (postoperative nausea and vomiting)    pe rpatient ; scopalamine patch very helpful   . Sleep apnea   . SVD (spontaneous vaginal delivery)    x 1  . Urinary incontinence     Past Surgical History:  Procedure Laterality Date  . CHOLECYSTECTOMY    . COLONOSCOPY  2017   polyps  . CYSTOSCOPY N/A 10/29/2016   Procedure: CYSTOSCOPY;  Surgeon: Salvadore Dom, MD;  Location: [facility] [facility];  Service: Gynecology;  Laterality: N/A;  . DILATATION & CURRETTAGE/HYSTEROSCOPY WITH RESECTOCOPE N/A 10/17/2012   Procedure: DILATATION & CURETTAGE/HYSTEROSCOPY WITH RESECTOCOPE; Possible Polypectomy, Possible Resectoscopic Myomectomy.;  Surgeon: Floyce Stakes. Pamala Hurry, MD;  Location: [facility];  Service: Gynecology;  Laterality: N/A;  1 hr.  Marland Kitchen DILATION AND CURETTAGE OF UTERUS    . ENDOMETRIAL BIOPSY    . ESOPHAGOGASTRODUODENOSCOPY ENDOSCOPY  2017   Normal  . HYSTEROSCOPY W/D&C N/A 12/01/2013   Procedure: IUD Removal;  Surgeon: Claiborne Billings A. Pamala Hurry, MD;  Location: [facility];  Service: Gynecology;  Laterality: N/A;  90 min.  . INTRAUTERINE DEVICE (IUD) INSERTION N/A 10/17/2012   Procedure: INTRAUTERINE DEVICE (IUD) INSERTION; Mirena;  Surgeon: Floyce Stakes. Pamala Hurry, MD;  Location: [facility];  Service: Gynecology;  Laterality: N/A;  . LAPAROSCOPIC GASTRIC SLEEVE RESECTION N/A  02/19/2017   Procedure: LAPAROSCOPIC GASTRIC SLEEVE RESECTION, UPPER ENDOSCOPY;  Surgeon: Johnathan Hausen, MD;  Location: [facility];  Service: General;  Laterality: N/A;  . LAPAROSCOPIC SALPINGO OOPHERECTOMY Left 10/29/2016   Procedure: LAPAROSCOPIC SALPINGO OOPHORECTOMY With anterior uterine biopsy;  Surgeon: Salvadore Dom, MD;  Location: [facility];  Service: Gynecology;  Laterality: Left;  . MOUTH SURGERY     wisdom teeth    Prior  to Admission medications   Medication Sig Start Date End Date Taking? Authorizing Provider  blood glucose meter kit and supplies KIT Dispense based on patient and insurance preference. Use up to four times daily as directed. (FOR ICD-9 250.00, 250.01). 10/18/16  Yes Danford, Valetta Fuller D, NP  cyclobenzaprine (FLEXERIL) 10 MG tablet Take 1 tablet (10 mg total) by mouth 3 (three) times daily as needed for muscle spasms. 07/30/17  Yes Danford, Valetta Fuller D, NP  diazepam (VALIUM) 5 MG tablet Take 1 tablet (5 mg total) by mouth every 8 (eight) hours as needed for anxiety. 10/18/16  Yes Danford, Valetta Fuller D, NP  ibuprofen (ADVIL,MOTRIN) 200 MG tablet Take 200 mg by mouth every 6 (six) hours as needed for headache or moderate pain.   Yes [provider]  levonorgestrel (MIRENA) 20 MCG/24HR IUD 1 each by Intrauterine route once.   Yes [provider]  loratadine (CLARITIN) 10 MG tablet Take 10 mg by mouth daily as needed for allergies.   Yes [provider]  Multiple Vitamins-Iron (MULTIVITAMINS WITH IRON) TABS tablet Take 1 tablet by mouth 2 (two) times daily. CHEWABLE   Yes [provider]  nystatin cream (MYCOSTATIN) Apply 1 application topically 2 (two) times daily. Apply to affected area BID for up to 7 days. Patient taking differently: Apply 1 application topically 2 (two) times daily as needed for dry skin (rash). A 05/30/17  Yes Salvadore Dom, MD  ondansetron (ZOFRAN) 4 MG tablet Take 4 mg by mouth every 8 (eight) hours as needed for nausea or vomiting.  04/12/17  Yes [provider]  OVER THE COUNTER MEDICATION Take 500 mg by mouth 3 (three) times daily. Bariatric calcium 56m    Yes [provider]  pantoprazole (PROTONIX) 40 MG tablet Take 40 mg by mouth daily as needed (reflux).  02/07/17  Yes [provider]  citalopram (CELEXA) 20 MG tablet Take 1/2 a tablet a day for 1 week , if tolerating well, increase to one tablet a day. Patient not taking:  Reported on 09/29/2017 05/30/17   JSalvadore Dom MD  phenazopyridine (PYRIDIUM) 200 MG tablet Take 1 tablet (200 mg total) by mouth 3 (three) times daily as needed. Patient not taking: Reported on 09/29/2017 08/12/17   JSalvadore Dom MD  predniSONE (DELTASONE) 20 MG tablet 1 tab every 12 hrs for 3 days. 1 tab daily for 3 days. 1/2 tab daily for 3 days Patient not taking: Reported on 09/29/2017 07/30/17   DMina MarbleD, NP  sulfamethoxazole-trimethoprim (BACTRIM DS) 800-160 MG tablet Take 1 tablet by mouth 2 (two) times daily. One PO BID x 3 days Patient not taking: Reported on 09/29/2017 08/12/17   JSalvadore Dom MD    Current Facility-Administered Medications  Medication Dose Route Frequency Provider Last Rate Last Dose  . 0.9 %  sodium chloride infusion   Intravenous Continuous SFuller PlanA, MD 125 mL/hr at 09/29/17 0119    . acetaminophen (TYLENOL) tablet 650 mg  650 mg Oral Q6H PRN SFuller PlanA, MD   650 mg at  09/29/17 0848   Or  . acetaminophen (TYLENOL) suppository 650 mg  650 mg Rectal Q6H PRN Smith, Rondell A, MD      . albuterol (PROVENTIL) (2.5 MG/3ML) 0.083% nebulizer solution 2.5 mg  2.5 mg Nebulization Q6H PRN Smith, Rondell A, MD      . enoxaparin (LOVENOX) injection 40 mg  40 mg Subcutaneous QHS Smith, Rondell A, MD      . famotidine (PEPCID) IVPB 20 mg premix  20 mg Intravenous Q12H Smith, Rondell A, MD 100 mL/hr at 09/29/17 0119 20 mg at 09/29/17 0119  . fentaNYL (SUBLIMAZE) injection 25 mcg  25 mcg Intravenous Q3H PRN Starla Link, Kshitiz, MD      . LORazepam (ATIVAN) injection 0.5 mg  0.5 mg Intravenous Q6H PRN Smith, Rondell A, MD      . ondansetron (ZOFRAN) tablet 4 mg  4 mg Oral Q6H PRN Fuller Plan A, MD       Or  . ondansetron (ZOFRAN) injection 4 mg  4 mg Intravenous Q6H PRN Fuller Plan A, MD   4 mg at 09/29/17 0849  . oxyCODONE (Oxy IR/ROXICODONE) immediate release tablet 5 mg  5 mg Oral Q4H PRN Alekh, Kshitiz, MD      . sodium chloride 0.9 %  bolus 1,000 mL  1,000 mL Intravenous Once Tamala Julian, Rondell A, MD      . sodium chloride flush (NS) 0.9 % injection 3 mL  3 mL Intravenous Q12H Fuller Plan A, MD        Allergies as of 09/28/2017 - Review Complete 09/28/2017  Allergen Reaction Noted  . Iohexol Hives 10/18/2008    Family History  Problem Relation Age of Onset  . Diabetes Father   . Thyroid disease Father   . Hypertension Father   . Diabetes Maternal Grandmother   . Heart disease Maternal Grandmother   . Diabetes Maternal Grandfather   . Heart disease Maternal Grandfather   . Stroke Maternal Grandfather   . Diabetes Paternal Grandmother   . Diabetes Paternal Grandfather   . Heart disease Paternal Grandfather   . Alcohol abuse Paternal Grandfather   . Diabetes Other   . Obesity Other   . Sleep apnea Other   . Hyperlipidemia Other   . Hypertension Other   . Cancer Other   . Heart disease Other   . Hypertension Mother     Social History   Socioeconomic History  . Marital status: Married    Spouse name: Not on file  . Number of children: Not on file  . Years of education: Not on file  . Highest education level: Not on file  Occupational History  . Not on file  Social Needs  . Financial resource strain: Not on file  . Food insecurity:    Worry: Not on file    Inability: Not on file  . Transportation needs:    Medical: Not on file    Non-medical: Not on file  Tobacco Use  . Smoking status: Former Smoker    Packs/day: 0.25    Years: 19.00    Pack years: 4.75    Types: Cigarettes    Last attempt to quit: 06/06/2016    Years since quitting: 1.3  . Smokeless tobacco: Never Used  Substance and Sexual Activity  . Alcohol use: No    Frequency: Never  . Drug use: No  . Sexual activity: Yes    Partners: Male    Birth control/protection: None, IUD  Lifestyle  . Physical activity:  Days per week: Not on file    Minutes per session: Not on file  . Stress: Not on file  Relationships  . Social  connections:    Talks on phone: Not on file    Gets together: Not on file    Attends religious service: Not on file    Active member of club or organization: Not on file    Attends meetings of clubs or organizations: Not on file    Relationship status: Not on file  . Intimate partner violence:    Fear of current or ex partner: Not on file    Emotionally abused: Not on file    Physically abused: Not on file    Forced sexual activity: Not on file  Other Topics Concern  . Not on file  Social History Narrative  . Not on file    Review of Systems: ROS is O/W negative except as mentioned in HPI.  Physical Exam: Vital signs in last 24 hours: Temp:  [97.8 F (36.6 C)-98.2 F (36.8 C)] 97.8 F (36.6 C) (05/26 0532) Pulse Rate:  [48-81] 74 (05/26 0532) Resp:  [16-20] 16 (05/26 0532) BP: (114-131)/(53-109) 131/109 (05/26 0532) SpO2:  [97 %-100 %] 100 % (05/26 0532) Weight:  [221 lb (100.2 kg)] 221 lb (100.2 kg) (05/25 2138)   General:  Alert, Well-developed, well-nourished, pleasant and cooperative in NAD Head:  Normocephalic and atraumatic. Eyes:  Sclera clear, no icterus.  Conjunctiva pink. Ears:  Normal auditory acuity. Mouth:  No deformity or lesions.   Lungs:  Clear throughout to auscultation.  No wheezes, crackles, or rhonchi.  Heart:  Regular rate and rhythm; no murmurs, clicks, rubs, or gallops. Abdomen:  Soft, non-distended.  BS present, but quiet.  Moderate epigastric TTP. Rectal:  Deferred  Msk:  Symmetrical without gross deformities. Pulses:  Normal pulses noted. Extremities:  Without clubbing or edema. Neurologic:  Alert and oriented x 4;  grossly normal neurologically. Skin:  Intact without significant lesions or rashes. Psych:  Alert and cooperative. Normal mood and affect.  Intake/Output from previous day: 05/25 0701 - 05/26 0700 In: 1500 [IV Piggyback:1500] Out: -   Lab Results: Recent Labs    09/28/17 1831 09/29/17 0152  WBC 11.3* 10.1  HGB 14.6 12.9   HCT 42.6 38.1  PLT 272 219   BMET Recent Labs    09/28/17 1831 09/29/17 0152  NA 140 141  K 4.2 3.4*  CL 105 106  CO2 26 23  GLUCOSE 98 92  BUN 17 15  CREATININE 0.84 0.87  CALCIUM 9.8 8.9   LFT Recent Labs    09/29/17 0152  PROT 7.3  ALBUMIN 4.0  AST 13*  ALT 10*  ALKPHOS 70  BILITOT 0.7    Studies/Results: Ct Abdomen Pelvis Wo Contrast  Result Date: 09/28/2017 CLINICAL DATA:  Epigastric pain. History of diabetes, gastroesophageal reflux, gastric sleeve procedure. Nausea and vomiting. Tenderness to palpation. Contrast allergy. EXAM: CT ABDOMEN AND PELVIS WITHOUT CONTRAST TECHNIQUE: Multidetector CT imaging of the abdomen and pelvis was performed following the standard protocol without IV contrast. COMPARISON:  11/02/2016 FINDINGS: Lower chest: Lung bases are clear. Hepatobiliary: No focal liver abnormality is seen. Status post cholecystectomy. No biliary dilatation. Pancreas: There is mild infiltration around the head of the pancreas suggesting early acute pancreatitis. No loculated fluid collections. No pancreatic ductal dilatation. Spleen: Normal in size without focal abnormality. Adrenals/Urinary Tract: Adrenal glands are unremarkable. Kidneys are normal, without renal calculi, focal lesion, or hydronephrosis. Bladder is unremarkable. Stomach/Bowel:  Postoperative changes consistent with history of gastric sleeve. Stomach is not abnormally distended and no wall thickening is appreciated. Small bowel are normal in caliber without abnormal wall thickening or infiltration. Scattered stool throughout the colon. No colonic distention or inflammation. Appendix is normal. Vascular/Lymphatic: No significant vascular findings are present. No enlarged abdominal or pelvic lymph nodes. Reproductive: An intrauterine device is present in the uterine fundus. Positioning appears to be appropriate. Uterus is not enlarged. No abnormal adnexal masses. Other: No abdominal wall hernia or abnormality.  No abdominopelvic ascites. Musculoskeletal: No acute or significant osseous findings. IMPRESSION: 1. Mild infiltration around the head of the pancreas suggest early acute pancreatitis. No complication identified. 2. Postoperative gastric sleeve. No evidence of bowel obstruction or inflammation. 3. Intrauterine device is present. Electronically Signed   By: Lucienne Capers M.D.   On: 09/28/2017 22:39   IMPRESSION:  *37 year old female with epigastric abdominal pain and CT scan W/O contrast suggesting early acute pancreatitis.  Lipase is normal, but clinically sounds like pancreatitis as well.  ? Source.  Does not have gallbladder (removed in 2010 for dyskinesia) and LFT's are normal, little to no ETOH, normal triglycerides, no medications to account for this.   PLAN: *Continue supportive measures with IVF's (going at 100 cc/hr), pain control, antiemetics.  Will allow clear liquids. *Of note, it is listed that patient is allergic to IV contrast, but says that she does fine with some Benadryl as pre-medication in the past if needed. *? MRCP and/or EUS in the future.  Laban Emperor. Zehr  09/29/2017, 10:27 AM   Attending physician's note   I have taken an interval history, reviewed the chart and examined the patient. I agree with the Advanced Practitioner's note, impression and recommendations.   37 year old with epigastric pain, CT scan showing mild acute pancreatitis. ?  Etiology-  No alcohol, s/p chole, no abnormal liver function tests, trauma, family history of pancreatitis, cocaine use, use of medications associated with pancreatitis, hypertriglyceridemia, hypercalcemia, intake of any herbal medications or previous history of pancreatitis.  Plan: Continue supportive treatment as above.  Check IgG4 (to rule out autoimmune pancreatitis) as outpatient.  Would consider MRCP/EUS as outpatient, especially if she continues to have problems.   Carmell Austria, MD

## 2017-09-30 LAB — CBC WITH DIFFERENTIAL/PLATELET
BASOS ABS: 0 10*3/uL (ref 0.0–0.1)
BASOS PCT: 0 %
EOS ABS: 0.3 10*3/uL (ref 0.0–0.7)
Eosinophils Relative: 4 %
HCT: 34.1 % — ABNORMAL LOW (ref 36.0–46.0)
Hemoglobin: 11.5 g/dL — ABNORMAL LOW (ref 12.0–15.0)
LYMPHS PCT: 37 %
Lymphs Abs: 2.8 10*3/uL (ref 0.7–4.0)
MCH: 29.1 pg (ref 26.0–34.0)
MCHC: 33.7 g/dL (ref 30.0–36.0)
MCV: 86.3 fL (ref 78.0–100.0)
Monocytes Absolute: 0.5 10*3/uL (ref 0.1–1.0)
Monocytes Relative: 7 %
Neutro Abs: 3.9 10*3/uL (ref 1.7–7.7)
Neutrophils Relative %: 52 %
PLATELETS: 185 10*3/uL (ref 150–400)
RBC: 3.95 MIL/uL (ref 3.87–5.11)
RDW: 12.8 % (ref 11.5–15.5)
WBC: 7.5 10*3/uL (ref 4.0–10.5)

## 2017-09-30 LAB — BASIC METABOLIC PANEL
ANION GAP: 7 (ref 5–15)
BUN: 9 mg/dL (ref 6–20)
CO2: 25 mmol/L (ref 22–32)
Calcium: 8.5 mg/dL — ABNORMAL LOW (ref 8.9–10.3)
Chloride: 107 mmol/L (ref 101–111)
Creatinine, Ser: 0.85 mg/dL (ref 0.44–1.00)
Glucose, Bld: 79 mg/dL (ref 65–99)
POTASSIUM: 3.8 mmol/L (ref 3.5–5.1)
SODIUM: 139 mmol/L (ref 135–145)

## 2017-09-30 LAB — MAGNESIUM: MAGNESIUM: 1.7 mg/dL (ref 1.7–2.4)

## 2017-09-30 MED ORDER — IBUPROFEN 200 MG PO TABS: 200.0000 mg | ORAL_TABLET | Freq: Four times a day (QID) | ORAL | Status: DC | PRN

## 2017-09-30 MED ORDER — SUMATRIPTAN SUCCINATE 50 MG PO TABS
50.0000 mg | ORAL_TABLET | Freq: Every day | ORAL | Status: DC | PRN
  Administered 2017-09-30: 50 mg via ORAL
  Filled 2017-09-30: qty 1

## 2017-09-30 NOTE — Progress Notes (Signed)
[name], female   DOB: 01-19-1981, 37 y.o.   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  CT of the abdomen pelvis revealed early signs for pancreatitis.  She was started on IV fluids.  General surgery was also consulted for history of sleeve gastrectomy.   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  -questionable cause.  Triglycerides normal.  No history of alcohol use.  Status post cholecystectomy in the past -GI following. -Continue IV fluids along with pain management and antiemetics.   -Clear liquid diet.  Will advance to full liquid diet.  Still has intermittent pain.  Leukocytosis -Probably reactive.  Resolved  Status post sleeve gastrectomy for morbid obesity -General surgery following  Anxiety -Ativan PRN  GERD -Pepcid  History of migraine -Complaining of headache.  Use Imitrex as needed as patient states she uses Imitrex at home   DVT prophylaxis: Lovenox Code Status: Full Family Communication: Spoke to patient at bedside.  Family members in room are sleeping  disposition Plan: Home in 1 to 3 days if clinically improves  Consultants: General surgery/GI  Procedures: None  Antimicrobials: None   Subjective: Patient seen and examined at bedside.  Patient states that she is having a headache.  Also complains of intermittent abdominal pain, getting better compared to yesterday.  Tolerating clear liquid diet.  No overnight fever. Objective: Vitals:   09/29/17 0532 09/29/17 1344 09/29/17 2110 09/30/17 0600  BP: (!) 131/109 (!) 111/58 105/61 120/62  Pulse: 74 (!) 52 (!) 44 62  Resp: 16 16 15 16   Temp: 97.8 F (36.6 C) 98.5 F (36.9 C) 98.1 F (36.7 C) 98.3 F (36.8 C)  TempSrc: Oral Oral Oral Oral  SpO2: 100% 99% 100% 100%  Weight:      Height:        Intake/Output Summary (Last 24 hours) at 09/30/2017 1032 Last data filed at 09/30/2017 1000 Gross per 24 hour  Intake 4665.42 ml  Output 1450 ml  Net 3215.42 ml   Filed Weights   09/28/17 2138  Weight: 100.2 kg (221 lb)    Examination:  General exam: Appears calm and comfortable.  No acute distress Respiratory system: Bilateral decreased breath sounds at bases Cardiovascular system: S1 & S2 heard, rate controlled.  Gastrointestinal system: Abdomen is nondistended, soft and still has mild epigastric and periumbilical tenderness. Normal bowel sounds heard. Extremities: No cyanosis, edema    Data Reviewed: I have personally reviewed following labs and imaging studies  CBC: Recent Labs  Lab 09/28/17 1831 09/29/17 0152 09/30/17 0355  WBC 11.3* 10.1 7.5  NEUTROABS  --   --  3.9  HGB 14.6 12.9 11.5*  HCT 42.6 38.1 34.1*  MCV 85.9 84.5 86.3  PLT 272 219 564   Basic Metabolic Panel: Recent Labs  Lab 09/28/17 1831 09/29/17 0152 09/30/17 0355  NA 140 141 139  K 4.2 3.4* 3.8  CL 105 106 107  CO2 26 23 25   GLUCOSE 98 92 79  BUN 17 15 9  CREATININE 0.84 0.87 0.85  CALCIUM 9.8 8.9 8.5*  MG  --   --  1.7   GFR: Estimated Creatinine Clearance: 113.2 mL/min (by C-G formula based on SCr of 0.85 mg/dL). Liver Function Tests: Recent Labs  Lab 09/28/17 1831 09/29/17 0152  AST 15 13*  ALT 13* 10*  ALKPHOS 82 70  BILITOT 0.9 0.7  PROT 8.2* 7.3  ALBUMIN 4.6 4.0   Recent Labs  Lab 09/28/17 1831  LIPASE 32   No results for input(s): AMMONIA in the last 168 hours. Coagulation Profile: No results for input(s): INR, PROTIME in the last 168 hours. Cardiac  Enzymes: Recent Labs  Lab 09/29/17 0152  CKTOTAL 54   BNP (last 3 results) No results for input(s): PROBNP in the last 8760 hours. HbA1C: No results for input(s): HGBA1C in the last 72 hours. CBG: No results for input(s): GLUCAP in the last 168 hours. Lipid Profile: Recent Labs    09/28/17 1822  TRIG 93   Thyroid Function Tests: Recent Labs    09/29/17 0152  TSH 1.484   Anemia Panel: No results for input(s): VITAMINB12, FOLATE, FERRITIN, TIBC, IRON, RETICCTPCT in the last 72 hours. Sepsis Labs: No results for input(s): PROCALCITON, LATICACIDVEN in the last 168 hours.  No results found for this or any previous visit (from the past 240 hour(s)).       Radiology Studies: Ct Abdomen Pelvis Wo Contrast  Result Date: 09/28/2017 CLINICAL DATA:  Epigastric pain. History of diabetes, gastroesophageal reflux, gastric sleeve procedure. Nausea and vomiting. Tenderness to palpation. Contrast allergy. EXAM: CT ABDOMEN AND PELVIS WITHOUT CONTRAST TECHNIQUE: Multidetector CT imaging of the abdomen and pelvis was performed following the standard protocol without IV contrast. COMPARISON:  11/02/2016 FINDINGS: Lower chest: Lung bases are clear. Hepatobiliary: No focal liver abnormality is seen. Status post cholecystectomy. No biliary dilatation. Pancreas: There is mild infiltration around the head of the pancreas suggesting early acute pancreatitis. No loculated fluid collections. No pancreatic ductal dilatation. Spleen: Normal in size without focal abnormality. Adrenals/Urinary Tract: Adrenal glands are unremarkable. Kidneys are normal, without renal calculi, focal lesion, or hydronephrosis. Bladder is unremarkable. Stomach/Bowel: Postoperative changes consistent with history of gastric sleeve. Stomach is not abnormally distended and no wall thickening is appreciated. Small bowel are normal in caliber without abnormal wall thickening or infiltration. Scattered stool throughout the colon. No  colonic distention or inflammation. Appendix is normal. Vascular/Lymphatic: No significant vascular findings are present. No enlarged abdominal or pelvic lymph nodes. Reproductive: An intrauterine device is present in the uterine fundus. Positioning appears to be appropriate. Uterus is not enlarged. No abnormal adnexal masses. Other: No abdominal wall hernia or abnormality. No abdominopelvic ascites. Musculoskeletal: No acute or significant osseous findings. IMPRESSION: 1. Mild infiltration around the head of the pancreas suggest early acute pancreatitis. No complication identified. 2. Postoperative gastric sleeve. No evidence of bowel obstruction or inflammation. 3. Intrauterine device is present. Electronically Signed   By: Lucienne Capers M.D.   On: 09/28/2017 22:39        Scheduled Meds: . enoxaparin (LOVENOX) injection  40 mg Subcutaneous QHS  . sodium chloride flush  3 mL Intravenous Q12H   Continuous Infusions: . sodium chloride 125 mL/hr at 09/30/17 0500  . famotidine (PEPCID) IV Stopped (09/30/17 0954)     LOS: 1 day        Aline August, MD [facility] Pager [phone]  If 7PM-7AM, please contact night-coverage [email or web address] Password [facility] 09/30/2017, 10:32 AM

## 2017-09-30 NOTE — Progress Notes (Signed)
[facility]:  [phone] General Surgery Progress Note   LOS: 1 day  POD -     Chief Complaint: Abdominal pain  Assessment and Plan: 1.  Pancreatitis by CT scan             Normal lipase             Seen by Dr. Lyndel Safe from GI  WBC - 7,500 - 09/30/2017  On clear liquids.  Still nauseated.  2.  History of sleeve gastrectomy - 02/19/2017 - [facility]             She has done well with weight loss.             I think that the changes in the pancreas are unrelated to the gastric sleeve surgery. 3.  DM - resolved with weight loss 4.  History of anxiety 5.  Sleep apnea - resolved with weight loss 6.  PCOS 7.  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  Some abdominal pain, but better.  No flatus.  Husband and son asleep in room.  Objective:   Vitals:   09/29/17 2110 09/30/17 0600  BP: 105/61 120/62  Pulse: (!) 44 62  Resp: 15 16  Temp: 98.1 F (36.7 C) 98.3 F (36.8 C)  SpO2: 100% 100%     Intake/Output from previous day:  05/26 0701 - 05/27 0700 In: 2165.4 [P.O.:480; I.V.:1585.4; IV Piggyback:100] Out: 1450 [Urine:1450]  Intake/Output this shift:  No intake/output data recorded.   Physical Exam:   General: Obese WF who is alert and oriented.  She looks like she feels better than yesterday.   HEENT: Normal. Pupils equal. .   Lungs: Clear   Abdomen: Soft. She has BS.   Lab Results:    Recent Labs    09/29/17 0152 09/30/17 0355  WBC 10.1 7.5  HGB 12.9 11.5*  HCT 38.1 34.1*  PLT 219 185    BMET   Recent Labs    09/29/17 0152 09/30/17 0355  NA 141 139  K 3.4* 3.8  CL 106 107  CO2 23 25  GLUCOSE 92 79  BUN 15 9  CREATININE 0.87 0.85  CALCIUM 8.9 8.5*    PT/INR  No results for input(s): LABPROT, INR in the last 72 hours.  ABG  No results for input(s): PHART, HCO3 in the  last 72 hours.  Invalid input(s): PCO2, PO2   Studies/Results:  Ct Abdomen Pelvis Wo Contrast  Result Date: 09/28/2017 CLINICAL DATA:  Epigastric pain. History of diabetes, gastroesophageal reflux, gastric sleeve procedure. Nausea and vomiting. Tenderness to palpation. Contrast allergy. EXAM: CT ABDOMEN AND PELVIS WITHOUT CONTRAST TECHNIQUE: Multidetector CT imaging of the abdomen and pelvis was performed following the standard protocol without IV contrast. COMPARISON:  11/02/2016 FINDINGS: Lower chest: Lung bases are clear. Hepatobiliary: No focal liver abnormality is seen. Status post cholecystectomy. No biliary dilatation. Pancreas: There is mild infiltration around the head of the pancreas suggesting early acute pancreatitis. No loculated fluid collections. No pancreatic ductal dilatation. Spleen: Normal in size without focal abnormality. Adrenals/Urinary Tract: Adrenal glands are unremarkable. Kidneys are normal, without renal calculi, focal lesion, or hydronephrosis. Bladder is unremarkable. Stomach/Bowel: Postoperative changes consistent with history of gastric sleeve. Stomach is not abnormally distended and no wall thickening is appreciated. Small bowel are normal in caliber  without abnormal wall thickening or infiltration. Scattered stool throughout the colon. No colonic distention or inflammation. Appendix is normal. Vascular/Lymphatic: No significant vascular findings are present. No enlarged abdominal or pelvic lymph nodes. Reproductive: An intrauterine device is present in the uterine fundus. Positioning appears to be appropriate. Uterus is not enlarged. No abnormal adnexal masses. Other: No abdominal wall hernia or abnormality. No abdominopelvic ascites. Musculoskeletal: No acute or significant osseous findings. IMPRESSION: 1. Mild infiltration around the head of the pancreas suggest early acute pancreatitis. No complication identified. 2. Postoperative gastric sleeve. No evidence of bowel  obstruction or inflammation. 3. Intrauterine device is present. Electronically Signed   By: Lucienne Capers M.D.   On: 09/28/2017 22:39     Anti-infectives:   Anti-infectives (From admission, onward)   None      Alphonsa Overall, MD, FACS Pager: [facility] Office: [phone] 09/30/2017

## 2017-09-30 NOTE — Progress Notes (Addendum)
[facility] Progress Note  CC:  Pancreatitis  Subjective:  Feeling a little better.  Pain reaches 7-8/10 when pain medication wearing off.  Not needing pain medication for 6-7 hours with the oxycodone, which is much better compared to the Fentanyl.  Feels a little nauseated and more pain when taking clears.  Belching but not passing flatus.  Objective:  Vital signs in last 24 hours: Temp:  [98.1 F (36.7 C)-98.5 F (36.9 C)] 98.3 F (36.8 C) (05/27 0600) Pulse Rate:  [44-62] 62 (05/27 0600) Resp:  [15-16] 16 (05/27 0600) BP: (105-120)/(58-62) 120/62 (05/27 0600) SpO2:  [99 %-100 %] 100 % (05/27 0600)   General:  Alert, Well-developed, in NAD Heart:  Regular rate and rhythm; no murmurs Pulm:  CTAB.  No increased WOB. Abdomen:  Soft, non-distended.  BS very quiet and hypoactive.  Moderate epigastric TTP. Extremities:  Without edema. Neurologic:  Alert and oriented x 4;  grossly normal neurologically. Psych:  Alert and cooperative. Normal mood and affect.  Intake/Output from previous day: 05/26 0701 - 05/27 0700 In: 2165.4 [P.O.:480; I.V.:1585.4; IV Piggyback:100] Out: 4132 [Urine:1450]  Lab Results: Recent Labs    09/28/17 1831 09/29/17 0152 09/30/17 0355  WBC 11.3* 10.1 7.5  HGB 14.6 12.9 11.5*  HCT 42.6 38.1 34.1*  PLT 272 219 185   BMET Recent Labs    09/28/17 1831 09/29/17 0152 09/30/17 0355  NA 140 141 139  K 4.2 3.4* 3.8  CL 105 106 107  CO2 26 23 25   GLUCOSE 98 92 79  BUN 17 15 9   CREATININE 0.84 0.87 0.85  CALCIUM 9.8 8.9 8.5*   LFT Recent Labs    09/29/17 0152  PROT 7.3  ALBUMIN 4.0  AST 13*  ALT 10*  ALKPHOS 70  BILITOT 0.7   Ct Abdomen Pelvis Wo Contrast  Result Date: 09/28/2017 CLINICAL DATA:  Epigastric pain. History of diabetes, gastroesophageal reflux, gastric sleeve procedure. Nausea and vomiting. Tenderness to palpation. Contrast allergy. EXAM: CT ABDOMEN AND PELVIS WITHOUT CONTRAST TECHNIQUE: Multidetector CT  imaging of the abdomen and pelvis was performed following the standard protocol without IV contrast. COMPARISON:  11/02/2016 FINDINGS: Lower chest: Lung bases are clear. Hepatobiliary: No focal liver abnormality is seen. Status post cholecystectomy. No biliary dilatation. Pancreas: There is mild infiltration around the head of the pancreas suggesting early acute pancreatitis. No loculated fluid collections. No pancreatic ductal dilatation. Spleen: Normal in size without focal abnormality. Adrenals/Urinary Tract: Adrenal glands are unremarkable. Kidneys are normal, without renal calculi, focal lesion, or hydronephrosis. Bladder is unremarkable. Stomach/Bowel: Postoperative changes consistent with history of gastric sleeve. Stomach is not abnormally distended and no wall thickening is appreciated. Small bowel are normal in caliber without abnormal wall thickening or infiltration. Scattered stool throughout the colon. No colonic distention or inflammation. Appendix is normal. Vascular/Lymphatic: No significant vascular findings are present. No enlarged abdominal or pelvic lymph nodes. Reproductive: An intrauterine device is present in the uterine fundus. Positioning appears to be appropriate. Uterus is not enlarged. No abnormal adnexal masses. Other: No abdominal wall hernia or abnormality. No abdominopelvic ascites. Musculoskeletal: No acute or significant osseous findings. IMPRESSION: 1. Mild infiltration around the head of the pancreas suggest early acute pancreatitis. No complication identified. 2. Postoperative gastric sleeve. No evidence of bowel obstruction or inflammation. 3. Intrauterine device is present. Electronically Signed   By: Lucienne Capers M.D.   On: 09/28/2017 22:39   Assessment / Plan: 37 year old female with epigastric abdominal pain and  CT scan W/O contrast suggesting early acute pancreatitis.  Lipase is normal, but clinically sounds like pancreatitis as well.  ? Source.  Does not have  gallbladder (removed in 2010 for dyskinesia) and LFT's are normal, little to no ETOH, normal triglycerides, no medications to account for this.   **Continue supportive measures with IVF's (going at 200 cc/hr), pain control, antiemetics.  Will keep on clear liquids for now. **Of note, it is listed that patient is allergic to IV contrast, but says that she does fine with some Benadryl as pre-medication in the past if needed. **Will check IgG4 level to rule out AIP. **? MRCP and/or EUS in the future in no improvement or recurrence.    LOS: 1 day   Laban Emperor. Zehr  09/30/2017, 8:53 AM   Attending physician's note   I have taken an interval history, reviewed the chart and examined the patient. I agree with the Advanced Practitioner's note, impression and recommendations.   Patient doing very well.  37 year old with acute pancreatitis of questionable etiology.  Plan: Continue current management, follow IgG-4.  If still with problems, CT scanning pancreatic protocol after contrast desensitization.  If she gets better, follow-up in the GI clinic as an outpatient in 3 to 4 weeks.  If she still has problems, would consider MRCP and/or EUS.  Will sign off for now.  Please call with any problems or concerns.  Carmell Austria, MD

## 2017-10-01 ENCOUNTER — Inpatient Hospital Stay (HOSPITAL_COMMUNITY): Payer: Managed Care, Other (non HMO)

## 2017-10-01 LAB — CBC WITH DIFFERENTIAL/PLATELET
Basophils Absolute: 0 10*3/uL (ref 0.0–0.1)
Basophils Relative: 0 %
Eosinophils Absolute: 0.3 10*3/uL (ref 0.0–0.7)
Eosinophils Relative: 4 %
HEMATOCRIT: 37.6 % (ref 36.0–46.0)
HEMOGLOBIN: 12.5 g/dL (ref 12.0–15.0)
LYMPHS ABS: 2.2 10*3/uL (ref 0.7–4.0)
LYMPHS PCT: 32 %
MCH: 28.9 pg (ref 26.0–34.0)
MCHC: 33.2 g/dL (ref 30.0–36.0)
MCV: 86.8 fL (ref 78.0–100.0)
MONOS PCT: 8 %
Monocytes Absolute: 0.6 10*3/uL (ref 0.1–1.0)
NEUTROS ABS: 4 10*3/uL (ref 1.7–7.7)
NEUTROS PCT: 56 %
Platelets: 197 10*3/uL (ref 150–400)
RBC: 4.33 MIL/uL (ref 3.87–5.11)
RDW: 12.6 % (ref 11.5–15.5)
WBC: 7.1 10*3/uL (ref 4.0–10.5)

## 2017-10-01 LAB — BASIC METABOLIC PANEL
Anion gap: 11 (ref 5–15)
BUN: 11 mg/dL (ref 6–20)
CHLORIDE: 106 mmol/L (ref 101–111)
CO2: 24 mmol/L (ref 22–32)
CREATININE: 0.8 mg/dL (ref 0.44–1.00)
Calcium: 9.1 mg/dL (ref 8.9–10.3)
GFR calc Af Amer: >60 mL/min (ref >60)
GFR calc non Af Amer: >60 mL/min (ref >60)
Glucose, Bld: 78 mg/dL (ref 65–99)
POTASSIUM: 4.2 mmol/L (ref 3.5–5.1)
Sodium: 141 mmol/L (ref 135–145)

## 2017-10-01 LAB — MAGNESIUM: Magnesium: 1.8 mg/dL (ref 1.7–2.4)

## 2017-10-01 MED ORDER — GI COCKTAIL ~~LOC~~
30.0000 mL | Freq: Three times a day (TID) | ORAL | Status: DC | PRN
  Filled 2017-10-01: qty 30

## 2017-10-01 MED ORDER — SUMATRIPTAN SUCCINATE 50 MG PO TABS
50.0000 mg | ORAL_TABLET | Freq: Every day | ORAL | Status: DC | PRN
  Filled 2017-10-01: qty 1

## 2017-10-01 MED ORDER — OXYCODONE HCL 5 MG PO TABS: 5.0000 mg | ORAL_TABLET | ORAL | Status: DC | PRN

## 2017-10-01 MED ORDER — GADOBENATE DIMEGLUMINE 529 MG/ML IV SOLN
20.0000 mL | Freq: Once | INTRAVENOUS | Status: AC | PRN
  Administered 2017-10-01: 20 mL via INTRAVENOUS

## 2017-10-01 MED ORDER — DIAZEPAM 5 MG PO TABS: 5.0000 mg | ORAL_TABLET | Freq: Three times a day (TID) | ORAL | Status: DC | PRN

## 2017-10-01 MED ORDER — IBUPROFEN 200 MG PO TABS: 200.0000 mg | ORAL_TABLET | Freq: Four times a day (QID) | ORAL | Status: DC | PRN

## 2017-10-01 NOTE — Progress Notes (Signed)
Visited with patient and family.  She had some concerns related to follow up visits and nutrition.  I have contacted both [facility] and [facility] for follow up for patient.  We discussed support group.  Other questions regarding skin breakdown from loose skin.  Questions answered.  Contact information provided.  [facility] Bariatric Nurse Coordinator [phone]

## 2017-10-01 NOTE — Progress Notes (Signed)
[name], female   DOB: 09-02-1980, 37 y.o.   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  CT of the abdomen pelvis revealed early signs for pancreatitis.  She was started on IV fluids.  General surgery was also consulted for history of sleeve gastrectomy.   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  -questionable cause.  Triglycerides normal.  No history of alcohol use.  Status post cholecystectomy in the past -GI has signed off.  Outpatient follow-up with GI -Continue IV fluids along with pain management and antiemetics.   -Tolerating full liquid diet but still having intermittent pain.  We will add GI cocktail.  Continue Pepcid.  Continue on full liquid diet today and probably advance to soft diet tomorrow.  Repeat a.m. labs  Leukocytosis -Probably reactive.  Resolved  Status post sleeve gastrectomy for morbid obesity -General surgery following  Anxiety -Ativan PRN  GERD -Pepcid  History of migraine - Use Imitrex as needed as patient states she uses Imitrex at home   DVT prophylaxis: Lovenox Code Status: Full Family Communication: Spoke to patient at bedside.  Family members in room are sleeping  disposition Plan: Home in 1 to 2 days if clinically improves  Consultants: General surgery/GI  Procedures: None  Antimicrobials: None   Subjective: Patient seen and examined at bedside.  Patient states that she is tolerating diet but has intermittent nausea and severe  abdominal pain mostly after eating.  No vomiting.  Complains of mild headache. Objective: Vitals:   09/30/17 1409 09/30/17 2128 10/01/17 0619 10/01/17 0959  BP: 123/64 105/64 (!) 100/54 115/61  Pulse: 79 (!) 49 (!) 51 (!) 44  Resp: 18 18 18 18   Temp: 98.4 F (36.9 C) 99 F (37.2 C) 98.5 F (36.9 C) 98.4 F (36.9 C)  TempSrc: Oral Oral Oral Oral  SpO2: 97% 96% 100% 100%  Weight:      Height:        Intake/Output Summary (Last 24 hours) at 10/01/2017 1054 Last data filed at 10/01/2017 1000 Gross per 24 hour  Intake 3315.83 ml  Output 3500 ml  Net -184.17 ml   Filed Weights   09/28/17 2138  Weight: 100.2 kg (221 lb)    Examination:  General exam: No distress Respiratory system: Bilateral decreased breath sounds at bases with no crackles Cardiovascular system: Rate controlled, S1-S2 heard Gastrointestinal system: Abdomen is nondistended, soft and still has mild epigastric and periumbilical tenderness. Normal bowel sounds heard. Extremities: No cyanosis, edema    Data Reviewed: I have personally reviewed following labs and imaging studies  CBC: Recent Labs  Lab 09/28/17 1831 09/29/17 0152 09/30/17 0355 10/01/17 0515  WBC 11.3* 10.1 7.5 7.1  NEUTROABS  --   --  3.9 4.0  HGB 14.6 12.9 11.5* 12.5  HCT 42.6 38.1 34.1* 37.6  MCV 85.9 84.5 86.3 86.8  PLT 272 219 185 619   Basic Metabolic Panel: Recent Labs  Lab 09/28/17 1831 09/29/17 0152 09/30/17 0355 10/01/17 0515  NA 140  141 139 141  K 4.2 3.4* 3.8 4.2  CL 105 106 107 106  CO2 26 23 25 24   GLUCOSE 98 92 79 78  BUN 17 15 9 11   CREATININE 0.84 0.87 0.85 0.80  CALCIUM 9.8 8.9 8.5* 9.1  MG  --   --  1.7 1.8   GFR: Estimated Creatinine Clearance: 120.3 mL/min (by C-G formula based on SCr of 0.8 mg/dL). Liver Function Tests: Recent Labs  Lab 09/28/17 1831 09/29/17 0152  AST 15 13*  ALT 13* 10*  ALKPHOS 82 70  BILITOT 0.9 0.7  PROT 8.2* 7.3  ALBUMIN 4.6 4.0   Recent Labs  Lab 09/28/17 1831    LIPASE 32   No results for input(s): AMMONIA in the last 168 hours. Coagulation Profile: No results for input(s): INR, PROTIME in the last 168 hours. Cardiac Enzymes: Recent Labs  Lab 09/29/17 0152  CKTOTAL 54   BNP (last 3 results) No results for input(s): PROBNP in the last 8760 hours. HbA1C: No results for input(s): HGBA1C in the last 72 hours. CBG: No results for input(s): GLUCAP in the last 168 hours. Lipid Profile: Recent Labs    09/28/17 1822  TRIG 93   Thyroid Function Tests: Recent Labs    09/29/17 0152  TSH 1.484   Anemia Panel: No results for input(s): VITAMINB12, FOLATE, FERRITIN, TIBC, IRON, RETICCTPCT in the last 72 hours. Sepsis Labs: No results for input(s): PROCALCITON, LATICACIDVEN in the last 168 hours.  Recent Results (from the past 240 hour(s))  Culture, blood (routine x 2)     Status: None (Preliminary result)   Collection Time: 09/29/17  1:14 AM  Result Value Ref Range Status   Specimen Description   Final    BLOOD RIGHT ANTECUBITAL Performed at [facility] [address], [place], [place] [address]    Special Requests   Final    BOTTLES DRAWN AEROBIC AND ANAEROBIC Blood Culture adequate volume Performed at [facility] [address], [place], [place] [address]    Culture   Final    NO GROWTH 1 DAY Performed at [facility], [place] [address], [place], [place] [address]    Report Status PENDING  Incomplete  Culture, blood (routine x 2)     Status: None (Preliminary result)   Collection Time: 09/29/17  1:14 AM  Result Value Ref Range Status   Specimen Description   Final    BLOOD RIGHT ARM Performed at [facility] [address], [place], [place] [address]    Special Requests   Final    BOTTLES DRAWN AEROBIC AND ANAEROBIC Blood Culture adequate volume Performed at [facility] [address], [place], [place] [address]    Culture   Final    NO  GROWTH 1 DAY Performed at [facility], [place] [address], [place],  [address]    Report Status PENDING  Incomplete         Radiology Studies: No results found.      Scheduled Meds: . enoxaparin (LOVENOX) injection  40 mg Subcutaneous QHS  . sodium chloride flush  3 mL Intravenous Q12H   Continuous Infusions: . sodium chloride 1,000 mL (10/01/17 0319)  . famotidine (PEPCID) IV Stopped (09/30/17 2231)     LOS: 2 days        Aline August, MD [facility] Pager [phone]  If 7PM-7AM, please contact night-coverage [email or web address] Password [facility] 10/01/2017, 10:54 AM

## 2017-10-01 NOTE — Plan of Care (Signed)
Plan of care discussed with patient 

## 2017-10-01 NOTE — Progress Notes (Signed)
[name], female   DOB: [birth year], 37 y.o.   MRN: [id number]       Subjective: Patient complains of worsening pain after eating, anything.  She gets nauseated as well, but no vomiting.   Objective: Vital signs in last 24 hours: Temp:  [98.4 F (36.9 C)-99 F (37.2 C)] 98.4 F (36.9 C) (05/28 0959) Pulse Rate:  [44-79] 44 (05/28 0959) Resp:  [18] 18 (05/28 0959) BP: (100-123)/(54-64) 115/61 (05/28 0959) SpO2:  [96 %-100 %] 100 % (05/28 0959) Last BM Date: 09/26/17  Intake/Output from previous day: 05/27 0701 - 05/28 0700 In: 5935.8 [P.O.:840; I.V.:5045.8; IV Piggyback:50] Out: 2800 [Urine:2800] Intake/Output this shift: Total I/O In: -  Out: 900 [Urine:900]  PE: Heart: regular Lungs: CTAB Abd: soft, tender in epigastrium, +BS, ND, but obese  Lab Results:  Recent Labs    09/30/17 0355 10/01/17 0515  WBC 7.5 7.1  HGB 11.5* 12.5  HCT 34.1* 37.6  PLT 185 197   BMET Recent Labs    09/30/17 0355 10/01/17 0515  NA 139 141  K 3.8 4.2  CL 107 106  CO2 25 24  GLUCOSE 79 78  BUN 9 11  CREATININE 0.85 0.80  CALCIUM 8.5* 9.1   PT/INR No results for input(s): LABPROT, INR in the last 72 hours. CMP     Component Value Date/Time   NA 141 10/01/2017 0515   NA 139 05/30/2017 1633   K 4.2 10/01/2017 0515   CL 106 10/01/2017 0515   CO2 24 10/01/2017 0515   GLUCOSE 78 10/01/2017 0515   BUN 11 10/01/2017 0515   BUN 17 05/30/2017 1633   CREATININE 0.80 10/01/2017 0515   CREATININE 0.77 03/14/2016 1409   CALCIUM 9.1 10/01/2017 0515   PROT 7.3 09/29/2017 0152   PROT 7.3 05/30/2017 1633   ALBUMIN 4.0 09/29/2017 0152   ALBUMIN 4.5 05/30/2017 1633   AST 13 (L) 09/29/2017 0152   ALT 10 (L) 09/29/2017 0152   ALKPHOS 70 09/29/2017 0152   BILITOT 0.7 09/29/2017 0152   BILITOT 0.4 05/30/2017 1633   GFRNONAA >60 10/01/2017 0515   GFRAA >60 10/01/2017 0515   Lipase     Component Value Date/Time   LIPASE 32 09/28/2017 1831        Studies/Results: No results found.  Anti-infectives: Anti-infectives (From admission, onward)   None       Assessment/Plan Pancreatitis -unclear etiology -conts to have pain with eating and drinking.  Given worsening pain and nausea, will change back to NPO and plan for MRCP to rule out a retained CBD stone from a lap chole she had about 9 years ago.   -cont to follow   FEN - NPO x ice and sips with meds VTE - Lovenox, SCDs ID - none   LOS: 2 days    Henreitta Cea , [facility] 10/01/2017, 11:42 AM Pager: [phone]

## 2017-10-02 LAB — CBC WITH DIFFERENTIAL/PLATELET
BASOS ABS: 0 10*3/uL (ref 0.0–0.1)
Basophils Relative: 0 %
Eosinophils Absolute: 0.3 10*3/uL (ref 0.0–0.7)
Eosinophils Relative: 4 %
HEMATOCRIT: 34.3 % — AB (ref 36.0–46.0)
HEMOGLOBIN: 11.6 g/dL — AB (ref 12.0–15.0)
LYMPHS PCT: 34 %
Lymphs Abs: 2.4 10*3/uL (ref 0.7–4.0)
MCH: 28.6 pg (ref 26.0–34.0)
MCHC: 33.8 g/dL (ref 30.0–36.0)
MCV: 84.7 fL (ref 78.0–100.0)
MONO ABS: 0.7 10*3/uL (ref 0.1–1.0)
Monocytes Relative: 9 %
NEUTROS ABS: 3.8 10*3/uL (ref 1.7–7.7)
NEUTROS PCT: 53 %
Platelets: 193 10*3/uL (ref 150–400)
RBC: 4.05 MIL/uL (ref 3.87–5.11)
RDW: 12.4 % (ref 11.5–15.5)
WBC: 7.2 10*3/uL (ref 4.0–10.5)

## 2017-10-02 LAB — BASIC METABOLIC PANEL
ANION GAP: 10 (ref 5–15)
BUN: 15 mg/dL (ref 6–20)
CHLORIDE: 107 mmol/L (ref 101–111)
CO2: 23 mmol/L (ref 22–32)
Calcium: 8.8 mg/dL — ABNORMAL LOW (ref 8.9–10.3)
Creatinine, Ser: 0.76 mg/dL (ref 0.44–1.00)
GFR calc Af Amer: >60 mL/min (ref >60)
GFR calc non Af Amer: >60 mL/min (ref >60)
GLUCOSE: 72 mg/dL (ref 65–99)
Potassium: 3.7 mmol/L (ref 3.5–5.1)
Sodium: 140 mmol/L (ref 135–145)

## 2017-10-02 LAB — MAGNESIUM: Magnesium: 1.8 mg/dL (ref 1.7–2.4)

## 2017-10-02 LAB — IGG 4: IGG 4: 20 mg/dL (ref 2–96)

## 2017-10-02 MED ORDER — ONDANSETRON HCL 4 MG PO TABS: 4.0000 mg | ORAL_TABLET | Freq: Three times a day (TID) | ORAL | 0 refills | Status: DC | PRN

## 2017-10-02 NOTE — Discharge Instructions (Signed)
Acute Pancreatitis Acute pancreatitis is a condition in which the pancreas suddenly gets irritated and swollen (has inflammation). The pancreas is a large gland behind the stomach. It makes enzymes that help to digest food. The pancreas also makes hormones that help to control your blood sugar. Acute pancreatitis happens when the enzymes attack the pancreas and damage it. Most attacks last a couple of days and can cause serious problems. Follow these instructions at home: Eating and drinking  Follow instructions from your doctor about diet. You may need to: ? Avoid alcohol. ? Limit how much fat is in your diet.  Eat small meals often. Avoid eating big meals.  Drink enough fluid to keep your pee (urine) clear or pale yellow.  Do not drink alcohol if it caused your condition. General instructions  Take over-the-counter and prescription medicines only as told by your doctor.  Do not use any tobacco products. These include cigarettes, chewing tobacco, and e-cigarettes. If you need help quitting, ask your doctor.  Get plenty of rest.  If directed, check your blood sugar at home as told by your doctor.  Keep all follow-up visits as told by your doctor. This is important. Contact a doctor if:  You do not get better as quickly as expected.  You have new symptoms.  Your symptoms get worse.  You have lasting pain or weakness.  You continue to feel sick to your stomach (nauseous).  You get better and then you have another pain attack.  You have a fever. Get help right away if:  You cannot eat or keep fluids down.  Your pain becomes very bad.  Your skin or the white part of your eyes turns yellow (jaundice).  You throw up (vomit).  You feel dizzy or you pass out (faint).  Your blood sugar is high (over 300 mg/dL). This information is not intended to replace advice given to you by your health care provider. Make sure you discuss any questions you have with your health care  provider. Document Released: 10/10/2007 Document Revised: 09/29/2015 Document Reviewed: 01/25/2015 Elsevier Interactive Patient Education  2018 Elsevier Inc.  

## 2017-10-02 NOTE — Discharge Summary (Signed)
Physician Discharge Summary  [name] [name] UYQ:[id number] DOB: 1980-12-13 DOA: 09/28/2017  PCP: [name] D, NP  Admit date: 09/28/2017 Discharge date: 10/02/2017  Time spent: 45 minutes  Recommendations for Outpatient Follow-up:  Patient will be discharged to home.  Patient will need to follow up with primary care provider within one week of discharge.  Follow up with current urology in 3 to 4 weeks.  Follow-up with Dr. Hassell Done at specified time.  Patient should continue medications as prescribed.  Patient should follow a soft diet.   Discharge Diagnoses:  Acute pancreatitis Leukocytosis Morbid obesity status post gastric sleeve gastrectomy Anxiety GERD History of migraine headaches  Discharge Condition: Stable  Diet recommendation: Soft, advance slowly  [facility] Weights   09/28/17 2138  Weight: 100.2 kg (221 lb)    History of present illness:  On 09/28/2017 by Dr. Fuller Plan [name] [name] is a 37 y.o. female with medical history significant of DM type II on medication, anxiety, s/p gastric sleeve, and s/p cholecystectomy in 2010; who presents with acute epigastric abdominal pain started around 3 AM this morning.  She thought symptoms may be related to acid reflux when it first started.  Patient describes pain initially as dull, but then changed to be more stabbing/burning like.  Pain radiates to her right flank.  Associated symptoms include nausea, dry heaves, intermittent sweats, belching, chills, anxiousness, mild shortness of breath,  foul urine, and low back pain.  She has not had anything to eat since yesterday evening.  Denies any fever, dysuria, chest pain, leg swelling, calf pain, or lightheadedness.  She is on bariatric multivitamins, calcium chews, and Protonix following her gastric sleeve  Hospital Course:  Acute pancreatitis -Seen on CT abdomen/pelvis -Unknown cause, Triglycerides normal, no history of alcohol use, s/p cholecystectomy in the  past -Gastroenterology consulted and appreciated, recommended outpatient follow up 3-4 weeks. If pain persists, recommended MRCP and/or EUS. Recommended IgG, 20 (WNL). -lipase was unremarkable  -General surgery consulted and appreciated -MRCP unremarkable for acute findings radiology pancreatitis -Diet advanced to soft, and tolerated well -was able to have a bowel movement today  Leukocytosis -Resolved  Morbid obesity status post gastric sleeve gastrectomy -Follow-up with general surgery  Anxiety -Continue Ativan as needed  GERD -Continue PPI  History of migraine headaches -Continue Imitrex as needed  Procedures: MRCP  Consultations: Gastroenterology General surgery  Discharge Exam: Vitals:   10/02/17 0942 10/02/17 1450  BP: 114/67 117/70  Pulse: (!) 49 (!) 53  Resp: 18 18  Temp: 98.5 F (36.9 C) 97.8 F (36.6 C)  SpO2: 100% 100%   Patient states she is feeling much better today.  Denies any further abdominal pain, nausea or vomiting.  Was able to have a bowel movement.  Denies chest pain, shortness of breath, dizziness or headache.   General: Well developed, well nourished, NAD, appears stated age  HEENT: NCAT,  mucous membranes moist.  Neck: Supple  Cardiovascular: S1 S2 auscultated, no rubs, murmurs or gallops. Regular rate and rhythm.  Respiratory: Clear to auscultation bilaterally with equal chest rise  Abdomen: Soft, nontender, nondistended, + bowel sounds  Extremities: warm dry without cyanosis clubbing or edema  Neuro: AAOx3, nonfocal  Psych: Pleasant, appropriate mood and affect  Discharge Instructions Discharge Instructions    Discharge instructions   Complete by:  As directed    Patient will be discharged to home.  Patient will need to follow up with primary care provider within one week of discharge.  Follow up with current  urology in 3 to 4 weeks.  Follow-up with Dr. Hassell Done at specified time.  Patient should continue medications as  prescribed.  Patient should follow a soft diet.     Allergies as of 10/02/2017      Reactions   Iohexol Hives         Medication List    STOP taking these medications   citalopram 20 MG tablet Commonly known as:  CELEXA   phenazopyridine 200 MG tablet Commonly known as:  PYRIDIUM   predniSONE 20 MG tablet Commonly known as:  DELTASONE   sulfamethoxazole-trimethoprim 800-160 MG tablet Commonly known as:  BACTRIM DS     TAKE these medications   blood glucose meter kit and supplies Kit Dispense based on patient and insurance preference. Use up to four times daily as directed. (FOR ICD-9 250.00, 250.01).   cyclobenzaprine 10 MG tablet Commonly known as:  FLEXERIL Take 1 tablet (10 mg total) by mouth 3 (three) times daily as needed for muscle spasms.   diazepam 5 MG tablet Commonly known as:  VALIUM Take 1 tablet (5 mg total) by mouth every 8 (eight) hours as needed for anxiety.   ibuprofen 200 MG tablet Commonly known as:  ADVIL,MOTRIN Take 200 mg by mouth every 6 (six) hours as needed for headache or moderate pain.   levonorgestrel 20 MCG/24HR IUD Commonly known as:  MIRENA 1 each by Intrauterine route once.   loratadine 10 MG tablet Commonly known as:  CLARITIN Take 10 mg by mouth daily as needed for allergies.   multivitamins with iron Tabs tablet Take 1 tablet by mouth 2 (two) times daily. CHEWABLE   nystatin cream Commonly known as:  MYCOSTATIN Apply 1 application topically 2 (two) times daily. Apply to affected area BID for up to 7 days. What changed:    when to take this  reasons to take this  additional instructions   ondansetron 4 MG tablet Commonly known as:  ZOFRAN Take 1 tablet (4 mg total) by mouth every 8 (eight) hours as needed for nausea or vomiting.   OVER THE COUNTER MEDICATION Take 500 mg by mouth 3 (three) times daily. Bariatric calcium 579m   pantoprazole 40 MG tablet Commonly known as:  PROTONIX Take 40 mg by mouth daily as  needed (reflux).      Allergies  Allergen Reactions  . Iohexol Hives       Follow-up Information    [place]   Specialty:  General Surgery Why:  at 1045 with Dr MKaylyn LimContact information: [phone]        GJackquline Denmark MD. Go to.   Specialties:  Gastroenterology, Internal Medicine Why:  3-4 weeks Contact information: [address] [phone]       DEsaw Grandchild NP. Schedule an appointment as soon as possible for a visit in 1 week(s).   Specialty:  Family Medicine Why:  Hospital follow up Contact information: [place] [phone]           The results of significant diagnostics from this hospitalization (including imaging, microbiology, ancillary and laboratory) are listed below for reference.    Significant Diagnostic Studies: Ct Abdomen Pelvis Wo Contrast  Result Date: 09/28/2017 CLINICAL DATA:  Epigastric pain. History of diabetes, gastroesophageal reflux, gastric sleeve procedure. Nausea and vomiting. Tenderness to palpation. Contrast allergy. EXAM: CT ABDOMEN AND PELVIS  WITHOUT CONTRAST TECHNIQUE: Multidetector CT imaging of the abdomen and pelvis was performed following the standard protocol without IV contrast. COMPARISON:  11/02/2016 FINDINGS: Lower chest: Lung bases are clear. Hepatobiliary: No focal liver abnormality is seen. Status post cholecystectomy. No biliary dilatation. Pancreas: There is mild infiltration around the head of the pancreas suggesting early acute pancreatitis. No loculated fluid collections. No pancreatic ductal dilatation. Spleen: Normal in size without focal abnormality. Adrenals/Urinary Tract: Adrenal glands are unremarkable. Kidneys are normal, without renal calculi, focal lesion, or hydronephrosis. Bladder is unremarkable. Stomach/Bowel: Postoperative  changes consistent with history of gastric sleeve. Stomach is not abnormally distended and no wall thickening is appreciated. Small bowel are normal in caliber without abnormal wall thickening or infiltration. Scattered stool throughout the colon. No colonic distention or inflammation. Appendix is normal. Vascular/Lymphatic: No significant vascular findings are present. No enlarged abdominal or pelvic lymph nodes. Reproductive: An intrauterine device is present in the uterine fundus. Positioning appears to be appropriate. Uterus is not enlarged. No abnormal adnexal masses. Other: No abdominal wall hernia or abnormality. No abdominopelvic ascites. Musculoskeletal: No acute or significant osseous findings. IMPRESSION: 1. Mild infiltration around the head of the pancreas suggest early acute pancreatitis. No complication identified. 2. Postoperative gastric sleeve. No evidence of bowel obstruction or inflammation. 3. Intrauterine device is present. Electronically Signed   By: Lucienne Capers M.D.   On: 09/28/2017 22:39   Mr 3d Recon At Scanner  Result Date: 10/01/2017 CLINICAL DATA:  Abdominal pain and nausea and vomiting for several days. Possible mild pancreatitis on recent CT. EXAM: MRI ABDOMEN WITHOUT AND WITH CONTRAST (INCLUDING MRCP) TECHNIQUE: Multiplanar multisequence MR imaging of the abdomen was performed both before and after the administration of intravenous contrast. Heavily T2-weighted images of the biliary and pancreatic ducts were obtained, and three-dimensional MRCP images were rendered by post processing. CONTRAST:  82m MULTIHANCE GADOBENATE DIMEGLUMINE 529 MG/ML IV SOLN COMPARISON:  CT on 09/28/2017 FINDINGS: Lower chest: No acute findings. Hepatobiliary: No hepatic masses identified. Prior cholecystectomy. No evidence of biliary ductal dilatation with common bile duct measuring 6 mm. No evidence of choledocholithiasis. Pancreas: No pancreatic mass identified. No evidence of acute peripancreatic  inflammatory changes or abnormal fluid collections. No evidence of pancreatic ductal dilatation. Spleen:  Within normal limits in size and appearance. Adrenals/Urinary Tract: No masses identified. No evidence of hydronephrosis. Tiny sub-cm left renal cyst noted. Stomach/Bowel: Expected postoperative changes from previous sleeve gastrectomy. Visualized portion otherwise unremarkable. Vascular/Lymphatic: No pathologically enlarged lymph nodes identified. No abdominal aortic aneurysm. Other:  None. Musculoskeletal:  No suspicious bone lesions identified. IMPRESSION: Negative. No evidence biliary ductal dilatation, choledocholithiasis, or other acute findings. Electronically Signed   By: JEarle GellM.D.   On: 10/01/2017 11:52   Mr Abdomen Mrcp WMoise BoringContast  Result Date: 10/01/2017 CLINICAL DATA:  Abdominal pain and nausea and vomiting for several days. Possible mild pancreatitis on recent CT. EXAM: MRI ABDOMEN WITHOUT AND WITH CONTRAST (INCLUDING MRCP) TECHNIQUE: Multiplanar multisequence MR imaging of the abdomen was performed both before and after the administration of intravenous contrast. Heavily T2-weighted images of the biliary and pancreatic ducts were obtained, and three-dimensional MRCP images were rendered by post processing. CONTRAST:  245mMULTIHANCE GADOBENATE DIMEGLUMINE 529 MG/ML IV SOLN COMPARISON:  CT on 09/28/2017 FINDINGS: Lower chest: No acute findings. Hepatobiliary: No hepatic masses identified. Prior cholecystectomy. No evidence of biliary ductal dilatation with common bile duct measuring 6 mm. No evidence of choledocholithiasis. Pancreas: No pancreatic mass identified. No evidence of acute peripancreatic  inflammatory changes or abnormal fluid collections. No evidence of pancreatic ductal dilatation. Spleen:  Within normal limits in size and appearance. Adrenals/Urinary Tract: No masses identified. No evidence of hydronephrosis. Tiny sub-cm left renal cyst noted. Stomach/Bowel: Expected  postoperative changes from previous sleeve gastrectomy. Visualized portion otherwise unremarkable. Vascular/Lymphatic: No pathologically enlarged lymph nodes identified. No abdominal aortic aneurysm. Other:  None. Musculoskeletal:  No suspicious bone lesions identified. IMPRESSION: Negative. No evidence biliary ductal dilatation, choledocholithiasis, or other acute findings. Electronically Signed   By: Earle Gell M.D.   On: 10/01/2017 11:52    Microbiology: Recent Results (from the past 240 hour(s))  Culture, blood (routine x 2)     Status: None (Preliminary result)   Collection Time: 09/29/17  1:14 AM  Result Value Ref Range Status   Specimen Description   Final    BLOOD RIGHT ANTECUBITAL Performed at [facility] [address] [place], [place] [address]    Special Requests   Final    BOTTLES DRAWN AEROBIC AND ANAEROBIC Blood Culture adequate volume Performed at [facility] [address] [place], [place] [address]    Culture   Final    NO GROWTH 3 DAYS Performed at [facility], [place] [address], [place], [place] [address]    Report Status PENDING  Incomplete  Culture, blood (routine x 2)     Status: None (Preliminary result)   Collection Time: 09/29/17  1:14 AM  Result Value Ref Range Status   Specimen Description   Final    BLOOD RIGHT ARM Performed at [facility] [address], [place], [place] [address]    Special Requests   Final    BOTTLES DRAWN AEROBIC AND ANAEROBIC Blood Culture adequate volume Performed at [facility] [address] [place], [place] [address]    Culture   Final    NO GROWTH 3 DAYS Performed at [facility], [place] [address], [place],  [address]    Report Status PENDING  Incomplete     Labs: Basic Metabolic Panel: Recent Labs  Lab 09/28/17 1831 09/29/17 0152 09/30/17 0355 10/01/17 0515 10/02/17 0437  NA 140 141 139 141 140  K 4.2 3.4* 3.8  4.2 3.7  CL 105 106 107 106 107  CO2 _0 GLUCOSE 98 92 79 78 72  BUN _1 CREATININE 0.84 0.87 0.85 0.80 0.76  CALCIUM 9.8 8.9 8.5* 9.1 8.8*  MG  --   --  1.7 1.8 1.8   Liver Function Tests: Recent Labs  Lab 09/28/17 1831 09/29/17 0152  AST 15 13*  ALT 13* 10*  ALKPHOS 82 70  BILITOT 0.9 0.7  PROT 8.2* 7.3  ALBUMIN 4.6 4.0   Recent Labs  Lab 09/28/17 1831  LIPASE 32   No results for input(s): AMMONIA in the last 168 hours. CBC: Recent Labs  Lab 09/28/17 1831 09/29/17 0152 09/30/17 0355 10/01/17 0515 10/02/17 0437  WBC 11.3* 10.1 7.5 7.1 7.2  NEUTROABS  --   --  3.9 4.0 3.8  HGB 14.6 12.9 11.5* 12.5 11.6*  HCT 42.6 38.1 34.1* 37.6 34.3*  MCV 85.9 84.5 86.3 86.8 84.7  PLT 272 219 185 197 193   Cardiac Enzymes: Recent Labs  Lab 09/29/17 0152  CKTOTAL 54   BNP: BNP (last 3 results) No results for input(s): BNP in the last 8760 hours.  ProBNP (last 3 results) No results for  input(s): PROBNP in the last 8760 hours.  CBG: No results for input(s): GLUCAP in the last 168 hours.     Signed:  Cristal Ford  [facility] 10/02/2017, 3:55 PM

## 2017-10-02 NOTE — Progress Notes (Signed)
[name], female   DOB: 02/13/1981, 37 y.o.   MRN: [id number]       Subjective: Pt feels much better today and is craving scrambled eggs.  Pain is better and tolerating her protein shake better.  Objective: Vital signs in last 24 hours: Temp:  [97.7 F (36.5 C)-98.5 F (36.9 C)] 98.5 F (36.9 C) (05/29 0942) Pulse Rate:  [43-55] 49 (05/29 0942) Resp:  [14-18] 18 (05/29 0942) BP: (110-126)/(61-91) 114/67 (05/29 0942) SpO2:  [100 %] 100 % (05/29 0942) Last BM Date: 09/26/17  Intake/Output from previous day: 05/28 0701 - 05/29 0700 In: 2708.8 [P.O.:360; I.V.:2248.8; IV Piggyback:100] Out: 8182 [Urine:3475] Intake/Output this shift: Total I/O In: -  Out: 1000 [Urine:1000]  PE: Heart: regular Lungs: CTAB Abd: soft, less tender, +BS, ND  Lab Results:  Recent Labs    10/01/17 0515 10/02/17 0437  WBC 7.1 7.2  HGB 12.5 11.6*  HCT 37.6 34.3*  PLT 197 193   BMET Recent Labs    10/01/17 0515 10/02/17 0437  NA 141 140  K 4.2 3.7  CL 106 107  CO2 24 23  GLUCOSE 78 72  BUN 11 15  CREATININE 0.80 0.76  CALCIUM 9.1 8.8*   PT/INR No results for input(s): LABPROT, INR in the last 72 hours. CMP     Component Value Date/Time   NA 140 10/02/2017 0437   NA 139 05/30/2017 1633   K 3.7 10/02/2017 0437   CL 107 10/02/2017 0437   CO2 23 10/02/2017 0437   GLUCOSE 72 10/02/2017 0437   BUN 15 10/02/2017 0437   BUN 17 05/30/2017 1633   CREATININE 0.76 10/02/2017 0437   CREATININE 0.77 03/14/2016 1409   CALCIUM 8.8 (L) 10/02/2017 0437   PROT 7.3 09/29/2017 0152   PROT 7.3 05/30/2017 1633   ALBUMIN 4.0 09/29/2017 0152   ALBUMIN 4.5 05/30/2017 1633   AST 13 (L) 09/29/2017 0152   ALT 10 (L) 09/29/2017 0152   ALKPHOS 70 09/29/2017 0152   BILITOT 0.7 09/29/2017 0152   BILITOT 0.4 05/30/2017 1633   GFRNONAA >60 10/02/2017 0437   GFRAA >60 10/02/2017 0437   Lipase     Component Value Date/Time   LIPASE 32 09/28/2017 1831        Studies/Results: Mr 3d Recon At Scanner  Result Date: 10/01/2017 CLINICAL DATA:  Abdominal pain and nausea and vomiting for several days. Possible mild pancreatitis on recent CT. EXAM: MRI ABDOMEN WITHOUT AND WITH CONTRAST (INCLUDING MRCP) TECHNIQUE: Multiplanar multisequence MR imaging of the abdomen was performed both before and after the administration of intravenous contrast. Heavily T2-weighted images of the biliary and pancreatic ducts were obtained, and three-dimensional MRCP images were rendered by post processing. CONTRAST:  36mL MULTIHANCE GADOBENATE DIMEGLUMINE 529 MG/ML IV SOLN COMPARISON:  CT on 09/28/2017 FINDINGS: Lower chest: No acute findings. Hepatobiliary: No hepatic masses identified. Prior cholecystectomy. No evidence of biliary ductal dilatation with common bile duct measuring 6 mm. No evidence of choledocholithiasis. Pancreas: No pancreatic mass identified. No evidence of acute peripancreatic inflammatory changes or abnormal fluid collections. No evidence of pancreatic ductal dilatation. Spleen:  Within normal limits in size and appearance. Adrenals/Urinary Tract: No masses identified. No evidence of hydronephrosis. Tiny sub-cm left renal cyst noted. Stomach/Bowel: Expected postoperative changes from previous sleeve gastrectomy. Visualized portion otherwise unremarkable. Vascular/Lymphatic: No pathologically enlarged lymph nodes identified. No abdominal aortic aneurysm. Other:  None. Musculoskeletal:  No suspicious bone lesions identified. IMPRESSION: Negative. No evidence biliary ductal dilatation, choledocholithiasis, or  other acute findings. Electronically Signed   By: Earle Gell M.D.   On: 10/01/2017 11:52   Mr Abdomen Mrcp Moise Boring Contast  Result Date: 10/01/2017 CLINICAL DATA:  Abdominal pain and nausea and vomiting for several days. Possible mild pancreatitis on recent CT. EXAM: MRI ABDOMEN WITHOUT AND WITH CONTRAST (INCLUDING MRCP) TECHNIQUE: Multiplanar multisequence  MR imaging of the abdomen was performed both before and after the administration of intravenous contrast. Heavily T2-weighted images of the biliary and pancreatic ducts were obtained, and three-dimensional MRCP images were rendered by post processing. CONTRAST:  76mL MULTIHANCE GADOBENATE DIMEGLUMINE 529 MG/ML IV SOLN COMPARISON:  CT on 09/28/2017 FINDINGS: Lower chest: No acute findings. Hepatobiliary: No hepatic masses identified. Prior cholecystectomy. No evidence of biliary ductal dilatation with common bile duct measuring 6 mm. No evidence of choledocholithiasis. Pancreas: No pancreatic mass identified. No evidence of acute peripancreatic inflammatory changes or abnormal fluid collections. No evidence of pancreatic ductal dilatation. Spleen:  Within normal limits in size and appearance. Adrenals/Urinary Tract: No masses identified. No evidence of hydronephrosis. Tiny sub-cm left renal cyst noted. Stomach/Bowel: Expected postoperative changes from previous sleeve gastrectomy. Visualized portion otherwise unremarkable. Vascular/Lymphatic: No pathologically enlarged lymph nodes identified. No abdominal aortic aneurysm. Other:  None. Musculoskeletal:  No suspicious bone lesions identified. IMPRESSION: Negative. No evidence biliary ductal dilatation, choledocholithiasis, or other acute findings. Electronically Signed   By: Earle Gell M.D.   On: 10/01/2017 11:52    Anti-infectives: Anti-infectives (From admission, onward)   None       Assessment/Plan Pancreatitis -MRCP is negative for any acute findings or etiology of pancreatitis. -pain has improved.  Agree with diet advancement.   -no surgical indications. -possibly home later today or tomorrow per medicine. -we will sign off.  Call if needed   FEN - soft diet VTE - Lovenox/SCDs ID - none   LOS: 3 days    Henreitta Cea , [facility] 10/02/2017, 9:51 AM Pager: [phone]

## 2017-10-02 NOTE — Plan of Care (Signed)
Discharge instructions given to patient. She verbalized understanding and had no questions. IV removed.

## 2017-10-02 NOTE — Progress Notes (Signed)
Patient appointment made with Dr Hassell Done and [facility] at patient request.  Information entered into Epic for patient review at discharge.  No new questions at this time.

## 2017-10-04 LAB — CULTURE, BLOOD (ROUTINE X 2)
Culture: NO GROWTH
Culture: NO GROWTH
Special Requests: ADEQUATE
Special Requests: ADEQUATE

## 2017-10-15 ENCOUNTER — Ambulatory Visit: Payer: Self-pay | Admitting: Skilled Nursing Facility1

## 2017-11-12 ENCOUNTER — Encounter: Payer: Self-pay | Attending: General Surgery | Admitting: Skilled Nursing Facility1

## 2017-11-12 DIAGNOSIS — E119 Type 2 diabetes mellitus without complications: Secondary | ICD-10-CM

## 2017-11-12 DIAGNOSIS — Z713 Dietary counseling and surveillance: Secondary | ICD-10-CM | POA: Insufficient documentation

## 2017-11-12 DIAGNOSIS — E669 Obesity, unspecified: Secondary | ICD-10-CM | POA: Insufficient documentation

## 2017-11-14 ENCOUNTER — Encounter: Payer: Self-pay | Admitting: Skilled Nursing Facility1

## 2017-11-14 NOTE — Progress Notes (Signed)
Follow-up visit:  Post-Operative sleeve Surgery  Medical Nutrition Therapy:  Appt start time: 6:00pm end time:  7:00pm  Primary concerns today: Post-operative Bariatric Surgery Nutrition Management 6 Month Post-Op Class   Surgery date: 02/19/2017 Surgery type: sleeve Start weight at [facility]: 296.9 Weight today: 220 pounds  TANITA  BODY COMP RESULTS  11/12/2017   BMI (kg/m^2) 33.5   Fat Mass (lbs) 73.4   Fat Free Mass (lbs) 146.6   Total Body Water (lbs) 106.2    Information Reviewed/ Discussed During Appointment: -Review of composition scale numbers -Fluid requirements (64-100 ounces) -Protein requirements (60-80g) -Strategies for tolerating diet -Advancement of diet to include Starchy vegetables -Barriers to inclusion of new foods -Inclusion of appropriate multivitamin and calcium supplements  -Exercise recommendations   Fluid intake: adequate   Medications: see list Supplementation: multi and calcium  Using straws: no Drinking while eating: no Having you been chewing well:no Chewing/swallowing difficulties: no Changes in vision: no Changes to mood/headaches: no Hair loss/Cahnges to skin/Changes to nails: no Any difficulty focusing or concentrating: no Sweating: no Dizziness/Lightheaded:  Palpitations: no  Carbonated beverages: no N/V/D/C/GAS: no Abdominal Pain: no Dumping syndrome: no  Recent physical activity:  adequate  Progress Towards Goal(s):  In progress.  Handouts given during visit include:  Phase V diet Progression   Goals Sheet  The Benefits of Exercise are endless.....  Support Group Topics  Pt Chosen Goals:  Drink 64 fluid ounces by 11/16/2017 elliptical 30 minutes 5 days a week by 11/23/2017   Teaching Method Utilized: Visual Auditory Hands on   Demonstrated degree of understanding via:  Teach Back   Monitoring/Evaluation:  Dietary intake, exercise, and body weight. Follow up in 3 months for 9 month post-op visit.

## 2017-12-23 ENCOUNTER — Other Ambulatory Visit: Payer: Self-pay

## 2017-12-23 DIAGNOSIS — I83893 Varicose veins of bilateral lower extremities with other complications: Secondary | ICD-10-CM

## 2017-12-24 ENCOUNTER — Encounter: Payer: Self-pay | Admitting: Vascular Surgery

## 2017-12-24 ENCOUNTER — Ambulatory Visit (HOSPITAL_COMMUNITY)
Admission: RE | Admit: 2017-12-24 | Discharge: 2017-12-24 | Disposition: A | Discharge status: Home / Self Care | Payer: Managed Care, Other (non HMO) | Source: Ambulatory Visit | Attending: Vascular Surgery | Admitting: Vascular Surgery

## 2017-12-24 ENCOUNTER — Other Ambulatory Visit: Payer: Self-pay

## 2017-12-24 ENCOUNTER — Ambulatory Visit (INDEPENDENT_AMBULATORY_CARE_PROVIDER_SITE_OTHER): Payer: Managed Care, Other (non HMO) | Admitting: Vascular Surgery

## 2017-12-24 DIAGNOSIS — I83893 Varicose veins of bilateral lower extremities with other complications: Secondary | ICD-10-CM | POA: Diagnosis present

## 2017-12-24 DIAGNOSIS — I872 Venous insufficiency (chronic) (peripheral): Secondary | ICD-10-CM | POA: Insufficient documentation

## 2017-12-24 DIAGNOSIS — I8393 Asymptomatic varicose veins of bilateral lower extremities: Secondary | ICD-10-CM | POA: Diagnosis not present

## 2017-12-24 DIAGNOSIS — I8390 Asymptomatic varicose veins of unspecified lower extremity: Secondary | ICD-10-CM | POA: Insufficient documentation

## 2017-12-24 NOTE — Progress Notes (Signed)
Patient name: [name] [name] MRN: [id number] DOB: 02/24/1981 Sex: female  REASON FOR CONSULT: Varicose veins of bilateral lower extremities  HPI: [name] [name] is a 37 y.o. female with history of morbid obesity status post sleeve gastrectomy as well as PCOS that presents for evaluation of varicose veins of her bilateral lower extremities.  Patient states she had a sleeve gastrectomy and has lost over 100 pounds.  However she has significant discomfort with both of her legs and has had varicose veins for years.  Patient states she has severe heaviness in both legs as well as pain particularly along the medial thigh and calf were varicosities are located.  She has never undergone any venous procedure before to either lower extremity.  She has been wearing 25 mm Hg knee-high compression hose over the last 6 weeks with no improvement.  She denies any ulcerations or bleeding issues to either lower extremity.  She states her pain has been ongoing on a daily basis.  Particularly worse with standing.  No history of blood clot or DVT.  Past Medical History:  Diagnosis Date  . Abnormal uterine bleeding   . Amenorrhea   . Anemia   . Anxiety    panic attacks  . Diabetes mellitus without complication ([place])    type 2 ---5/26 no loner in pre diabetic range since bariatric sx 6 months ago  . Dysmenorrhea   . GERD (gastroesophageal reflux disease)    occasional - diet controlled and tums prn  . Hyperlipidemia    no meds -diet controlled  . Infertility, female   . Insulin resistance   . Migraine without aura   . Missed abortion    x 2 - both resolved without surgery  . Obesity   . PCOS (polycystic ovarian syndrome)   . PCOS (polycystic ovarian syndrome)   . PONV (postoperative nausea and vomiting)    pe rpatient ; scopalamine patch very helpful   . Sleep apnea   . SVD (spontaneous vaginal delivery)    x 1  . Urinary incontinence     Past Surgical History:  Procedure Laterality Date  .  CHOLECYSTECTOMY    . COLONOSCOPY  2017   polyps  . CYSTOSCOPY N/A 10/29/2016   Procedure: CYSTOSCOPY;  Surgeon: Salvadore Dom, MD;  Location: [facility];  Service: Gynecology;  Laterality: N/A;  . DILATATION & CURRETTAGE/HYSTEROSCOPY WITH RESECTOCOPE N/A 10/17/2012   Procedure: DILATATION & CURETTAGE/HYSTEROSCOPY WITH RESECTOCOPE; Possible Polypectomy, Possible Resectoscopic Myomectomy.;  Surgeon: Floyce Stakes. Pamala Hurry, MD;  Location: [facility];  Service: Gynecology;  Laterality: N/A;  1 hr.  Marland Kitchen DILATION AND CURETTAGE OF UTERUS    . ENDOMETRIAL BIOPSY    . ESOPHAGOGASTRODUODENOSCOPY ENDOSCOPY  2017   Normal  . HYSTEROSCOPY W/D&C N/A 12/01/2013   Procedure: IUD Removal;  Surgeon: Claiborne Billings A. Pamala Hurry, MD;  Location: [facility];  Service: Gynecology;  Laterality: N/A;  90 min.  . INTRAUTERINE DEVICE (IUD) INSERTION N/A 10/17/2012   Procedure: INTRAUTERINE DEVICE (IUD) INSERTION; Mirena;  Surgeon: Floyce Stakes. Pamala Hurry, MD;  Location: [facility];  Service: Gynecology;  Laterality: N/A;  . LAPAROSCOPIC GASTRIC SLEEVE RESECTION N/A 02/19/2017   Procedure: LAPAROSCOPIC GASTRIC SLEEVE RESECTION, UPPER ENDOSCOPY;  Surgeon: Johnathan Hausen, MD;  Location: [facility];  Service: General;  Laterality: N/A;  . LAPAROSCOPIC SALPINGO OOPHERECTOMY Left 10/29/2016   Procedure: LAPAROSCOPIC SALPINGO OOPHORECTOMY With anterior uterine biopsy;  Surgeon: Salvadore Dom, MD;  Location: [facility];  Service: Gynecology;  Laterality: Left;  . MOUTH SURGERY  wisdom teeth    Family History  Problem Relation Age of Onset  . Diabetes Father   . Thyroid disease Father   . Hypertension Father   . Diabetes Maternal Grandmother   . Heart disease Maternal Grandmother   . Diabetes Maternal Grandfather   . Heart disease Maternal Grandfather   . Stroke Maternal Grandfather   . Diabetes Paternal Grandmother   . Diabetes Paternal Grandfather   . Heart disease Paternal Grandfather   . Alcohol abuse Paternal Grandfather   . Diabetes Other     . Obesity Other   . Sleep apnea Other   . Hyperlipidemia Other   . Hypertension Other   . Cancer Other   . Heart disease Other   . Hypertension Mother     SOCIAL HISTORY: Social History   Socioeconomic History  . Marital status: Married    Spouse name: Not on file  . Number of children: Not on file  . Years of education: Not on file  . Highest education level: Not on file  Occupational History  . Not on file  Social Needs  . Financial resource strain: Not on file  . Food insecurity:    Worry: Not on file    Inability: Not on file  . Transportation needs:    Medical: Not on file    Non-medical: Not on file  Tobacco Use  . Smoking status: Former Smoker    Packs/day: 0.25    Years: 19.00    Pack years: 4.75    Types: Cigarettes    Last attempt to quit: 06/06/2016    Years since quitting: 1.5  . Smokeless tobacco: Never Used  Substance and Sexual Activity  . Alcohol use: No    Frequency: Never  . Drug use: No  . Sexual activity: Yes    Partners: Male    Birth control/protection: None, IUD  Lifestyle  . Physical activity:    Days per week: Not on file    Minutes per session: Not on file  . Stress: Not on file  Relationships  . Social connections:    Talks on phone: Not on file    Gets together: Not on file    Attends religious service: Not on file    Active member of club or organization: Not on file    Attends meetings of clubs or organizations: Not on file    Relationship status: Not on file  . Intimate partner violence:    Fear of current or ex partner: Not on file    Emotionally abused: Not on file    Physically abused: Not on file    Forced sexual activity: Not on file  Other Topics Concern  . Not on file  Social History Narrative  . Not on file    Allergies: Iohexol  Current Outpatient Medications  Medication Sig Dispense Refill  . blood glucose meter kit and supplies KIT Dispense based on patient and insurance preference. Use up to four times  daily as directed. (FOR ICD-9 250.00, 250.01). 1 each 0  . cyclobenzaprine (FLEXERIL) 10 MG tablet Take 1 tablet (10 mg total) by mouth 3 (three) times daily as needed for muscle spasms. 30 tablet 0  . diazepam (VALIUM) 5 MG tablet Take 1 tablet (5 mg total) by mouth every 8 (eight) hours as needed for anxiety. 60 tablet 0  . ibuprofen (ADVIL,MOTRIN) 200 MG tablet Take 200 mg by mouth every 6 (six) hours as needed for headache or moderate pain.    Marland Kitchen  levonorgestrel (MIRENA) 20 MCG/24HR IUD 1 each by Intrauterine route once.    . loratadine (CLARITIN) 10 MG tablet Take 10 mg by mouth daily as needed for allergies.    . Multiple Vitamins-Iron (MULTIVITAMINS WITH IRON) TABS tablet Take 1 tablet by mouth 2 (two) times daily. CHEWABLE    . nystatin cream (MYCOSTATIN) Apply 1 application topically 2 (two) times daily. Apply to affected area BID for up to 7 days. (Patient taking differently: Apply 1 application topically 2 (two) times daily as needed for dry skin (rash). A) 30 g 2  . ondansetron (ZOFRAN) 4 MG tablet Take 1 tablet (4 mg total) by mouth every 8 (eight) hours as needed for nausea or vomiting. 30 tablet 0  . OVER THE COUNTER MEDICATION Take 500 mg by mouth 3 (three) times daily. Bariatric calcium 596m     . pantoprazole (PROTONIX) 40 MG tablet Take 40 mg by mouth daily as needed (reflux).   3   No current facility-administered medications for this visit.     REVIEW OF SYSTEMS:  _0  denotes positive finding, _1  denotes negative finding Cardiac  Comments:  Chest pain or chest pressure:    Shortness of breath upon exertion:    Short of breath when lying flat:    Irregular heart rhythm:        Vascular    Pain in calf, thigh, or hip brought on by ambulation:    Pain in feet at night that wakes you up from your sleep:     Blood clot in your veins:    Leg swelling:  x       Pulmonary    Oxygen at home:    Productive cough:     Wheezing:         Neurologic    Sudden weakness in  arms or legs:     Sudden numbness in arms or legs:     Sudden onset of difficulty speaking or slurred speech:    Temporary loss of vision in one eye:     Problems with dizziness:         Gastrointestinal    Blood in stool:     Vomited blood:         Genitourinary    Burning when urinating:     Blood in urine:        Psychiatric    Major depression:         Hematologic    Bleeding problems:    Problems with blood clotting too easily:        Skin    Rashes or ulcers:        Constitutional    Fever or chills:      PHYSICAL EXAM: Vitals:   12/24/17 1427  BP: 107/72  Pulse: (!) 52  Resp: 18  SpO2: 99%  Weight: 100.9 kg  Height: _2  (1.727 m)    GENERAL: The patient is a well-nourished female, in no acute distress. The vital signs are documented above. CARDIAC: There is a regular rate and rhythm.  VASCULAR:  2+ radial pulse BUE palpable 2+ femoral pulse bilateral groins palpable 2+ DP pulse BLE palpable No tissue loss BLE. She has large ropey varicosities along both medial thighs as well as down into her medial calfs.  Additionally she has some varicosities on the back of both of her calves. PULMONARY: No respiratory distress. ABDOMEN: Soft and non-tender with normal pitched bowel sounds.  MUSCULOSKELETAL: There are no major deformities  or cyanosis. NEUROLOGIC: No focal weakness or paresthesias are detected. SKIN: There are no ulcers or rashes noted. PSYCHIATRIC: The patient has a normal affect.  DATA:   I have independently reviewed her noninvasive imaging and she has significant superficial reflux in the bilateral great saphenous veins down to her knee.  Assessment/Plan:  37 year old female with bilateral lower extremity venous insufficiency with severe superficial reflux.  We discussed typical management given the significant symptoms that she is having in both of her legs.  She has been wearing knee-high compression for the last 6 weeks.  We fitted her  today for thigh-high compression.  We have instructed her that we will have her follow-up in 3 months and she will see either Dr. Scot Dock or Dr. Oneida Alar and likely make plans moving forward for venous intervention.  She has significant superficial reflux in both of her lower extremities and her veins appear to be of adequate caliber for intervention.  Given the severity of her symptoms and her exam, I think she would do well with an intervention.  Encouraged her to call with questions or concerns.   Marty Heck, MD [facility] Office: [phone] Pager: [phone]

## 2018-02-04 ENCOUNTER — Encounter (HOSPITAL_COMMUNITY): Payer: Self-pay

## 2018-02-04 ENCOUNTER — Encounter: Payer: Self-pay | Admitting: Vascular Surgery

## 2018-02-12 ENCOUNTER — Ambulatory Visit: Payer: Self-pay | Admitting: Skilled Nursing Facility1

## 2018-03-20 ENCOUNTER — Encounter: Payer: Self-pay | Admitting: Vascular Surgery

## 2018-03-20 ENCOUNTER — Ambulatory Visit: Payer: Managed Care, Other (non HMO) | Admitting: Vascular Surgery

## 2018-03-20 ENCOUNTER — Other Ambulatory Visit: Payer: Self-pay

## 2018-03-20 VITALS — BP 106/72 | HR 75 | Temp 98.0°F | Resp 16 | Ht 68.0 in | Wt 223.0 lb

## 2018-03-20 DIAGNOSIS — I83893 Varicose veins of bilateral lower extremities with other complications: Secondary | ICD-10-CM | POA: Diagnosis not present

## 2018-03-20 NOTE — Progress Notes (Signed)
Patient name: [name] [name] MRN: [id number] DOB: [birth year] Sex: female  REASON FOR VISIT:   13-monthfollow-up visit.  HPI:   [name] RORRERis a pleasant 37y.o. female who was seen in our office on 12/24/2017 by Dr. CFortunato Curlingwith varicose veins of both lower extremities.  Of note, the patient has morbid obesity and is status post sleeve gastrectomy as well as PCOS.  She has lost over 100 pounds.  She was having significant discomfort in both legs and varicose veins which she had for years.  Her noninvasive study at that time showed significant reflux in both great saphenous veins down to the knee.  She was fitted for thigh-high compression stockings with a gradient of 20 to 30 mmHg.  She was instructed to elevate her legs and take ibuprofen as needed for pain.  She comes in for 362-monthollow-up visit.  Since she was seen last, she continues to have significant pain in both legs.  Her symptoms are more significant on the left side.  She describes aching pain heaviness and fatigue.  The symptoms are aggravated by standing and sitting and relieved with elevation.  She has been wearing her thigh-high compression stockings without significant relief.  She takes ibuprofen as needed for pain.  She denies any previous history of DVT or phlebitis.  Past Medical History:  Diagnosis Date  . Abnormal uterine bleeding   . Amenorrhea   . Anemia   . Anxiety    panic attacks  . [place]   type 2 ---5/26 no loner in pre diabetic range since bariatric sx 6 months ago  . Dysmenorrhea   . GERD (gastroesophageal reflux disease)    occasional - diet controlled and tums prn  . Hyperlipidemia    no meds -diet controlled  . Infertility, female   . Insulin resistance   . Migraine without aura   . Missed abortion    x 2 - both resolved without surgery  . Obesity   . PCOS (polycystic ovarian syndrome)   . PCOS (polycystic ovarian syndrome)   . PONV (postoperative  nausea and vomiting)    pe rpatient ; scopalamine patch very helpful   . Sleep apnea   . SVD (spontaneous vaginal delivery)    x 1  . Urinary incontinence     Family History  Problem Relation Age of Onset  . Diabetes Father   . Thyroid disease Father   . Hypertension Father   . Diabetes Maternal Grandmother   . Heart disease Maternal Grandmother   . Diabetes Maternal Grandfather   . Heart disease Maternal Grandfather   . Stroke Maternal Grandfather   . Diabetes Paternal Grandmother   . Diabetes Paternal Grandfather   . Heart disease Paternal Grandfather   . Alcohol abuse Paternal Grandfather   . Diabetes Other   . Obesity Other   . Sleep apnea Other   . Hyperlipidemia Other   . Hypertension Other   . Cancer Other   . Heart disease Other   . Hypertension Mother     SOCIAL HISTORY: Social History   Tobacco Use  . Smoking status: Former Smoker    Packs/day: 0.25    Years: 19.00    Pack years: 4.75    Types: Cigarettes    Last attempt to quit: 06/06/2016    Years since quitting: 1.7  . Smokeless tobacco: Never Used  Substance Use Topics  . Alcohol use: No    Frequency:  Never    Current Outpatient Medications  Medication Sig Dispense Refill  . blood glucose meter kit and supplies KIT Dispense based on patient and insurance preference. Use up to four times daily as directed. (FOR ICD-9 250.00, 250.01). 1 each 0  . Calcium-Vitamin D-Vitamin K (CALCIUM SOFT CHEWS PO) Take by mouth daily.    . cyclobenzaprine (FLEXERIL) 10 MG tablet Take 1 tablet (10 mg total) by mouth 3 (three) times daily as needed for muscle spasms. 30 tablet 0  . diazepam (VALIUM) 5 MG tablet Take 1 tablet (5 mg total) by mouth every 8 (eight) hours as needed for anxiety. 60 tablet 0  . ibuprofen (ADVIL,MOTRIN) 200 MG tablet Take 200 mg by mouth every 6 (six) hours as needed for headache or moderate pain.    Marland Kitchen levonorgestrel (MIRENA) 20 MCG/24HR IUD 1 each by Intrauterine route once.    . loratadine  (CLARITIN) 10 MG tablet Take 10 mg by mouth daily as needed for allergies.    . Multiple Vitamins-Iron (MULTIVITAMINS WITH IRON) TABS tablet Take 1 tablet by mouth 2 (two) times daily. CHEWABLE    . nystatin cream (MYCOSTATIN) Apply 1 application topically 2 (two) times daily. Apply to affected area BID for up to 7 days. (Patient taking differently: Apply 1 application topically 2 (two) times daily as needed for dry skin (rash). A) 30 g 2  . ondansetron (ZOFRAN) 4 MG tablet Take 1 tablet (4 mg total) by mouth every 8 (eight) hours as needed for nausea or vomiting. 30 tablet 0  . OVER THE COUNTER MEDICATION Take 500 mg by mouth 3 (three) times daily. Bariatric calcium 525m     . pantoprazole (PROTONIX) 40 MG tablet Take 40 mg by mouth daily as needed (reflux).   3   No current facility-administered medications for this visit.     REVIEW OF SYSTEMS:  _0  denotes positive finding, _1  denotes negative finding Cardiac  Comments:  Chest pain or chest pressure:    Shortness of breath upon exertion:    Short of breath when lying flat:    Irregular heart rhythm:        Vascular    Pain in calf, thigh, or hip brought on by ambulation:    Pain in feet at night that wakes you up from your sleep:     Blood clot in your veins:    Leg swelling:  x       Pulmonary    Oxygen at home:    Productive cough:     Wheezing:         Neurologic    Sudden weakness in arms or legs:     Sudden numbness in arms or legs:     Sudden onset of difficulty speaking or slurred speech:    Temporary loss of vision in one eye:     Problems with dizziness:         Gastrointestinal    Blood in stool:     Vomited blood:         Genitourinary    Burning when urinating:     Blood in urine:        Psychiatric    Major depression:         Hematologic    Bleeding problems:    Problems with blood clotting too easily:        Skin    Rashes or ulcers:        Constitutional    Fever or  chills:     PHYSICAL  EXAM:   Vitals:   03/20/18 1526  BP: 106/72  Pulse: 75  Resp: 16  Temp: 98 F (36.7 C)  TempSrc: Oral  SpO2: 98%  Weight: 223 lb (101.2 kg)  Height: _0  (1.727 m)    GENERAL: The patient is a well-nourished female, in no acute distress. The vital signs are documented above. CARDIAC: There is a regular rate and rhythm.  VASCULAR: I do not detect carotid bruits. I have a difficult time palpating pedal pulses however she had a biphasic posterior tibial and anterior tibial signal bilaterally.  Dorsalis pedis signals were somewhat monophasic. She has a dilated truncal varicosities along the medial aspect of her right thigh and leg.  She has more significant varicosities under high pressure along the medial aspect of her distal left thigh and left leg which extends around to her left posterior calf.  She has mild bilateral lower extremity swelling.  Currently she does not have significant hyperpigmentation. I did look at both great saphenous veins myself with the SonoSite.  The left great saphenous vein is significantly dilated and feeds this large cluster of varicose veins in the distal thigh.  I think the vein could be cannulated just above this cluster varicose veins in the distal thigh. The great saphenous vein on the right is also dilated although not as significantly as on the left.  This likewise feeds the varicosities along medial aspect of the distal right thigh. PULMONARY: There is good air exchange bilaterally without wheezing or rales. ABDOMEN: Soft and non-tender with normal pitched bowel sounds.  MUSCULOSKELETAL: There are no major deformities or cyanosis. NEUROLOGIC: No focal weakness or paresthesias are detected. SKIN: There are no ulcers or rashes noted. PSYCHIATRIC: The patient has a normal affect.  DATA:    VENOUS DUPLEX: I have reviewed the venous duplex scan that was done on 12/24/2017.    On the right side, there is no evidence of DVT or superficial thrombophlebitis.   There was deep venous reflux in the common femoral vein on the right.  There is reflux in the saphenous vein and the vein was significantly dilated especially in the proximal thigh.  On the left side there was no evidence of DVT or superficial thrombophlebitis.  There was deep venous reflux involving the common femoral vein.  There is reflux in the great  saphenous vein in the proximal small saphenous vein.  The great saphenous vein on the left was significantly dilated with diameters ranging from 0.73 in the distal thigh to 1.06 in the proximal thigh.  MEDICAL ISSUES:   CHRONIC VENOUS INSUFFICIENCY: This patient has significant superficial venous reflux bilaterally involving the great saphenous veins.  These incompetent veins feet a large cluster varicose veins on both sides.  Her varicosities on the left side are more significant.  Patient is having significant symptoms and is failed conservative treatment including thigh-high compression stockings, leg elevation, and ibuprofen as needed for pain.  I have again discussed with her the importance of intermittent leg elevation and proper positioning for this.  I have encouraged her to continue to wear her thigh-high stockings.  I encouraged her to avoid prolonged sitting and standing.  We have discussed the importance of exercise and specifically walking and water aerobics.  In addition she has lost 100 pounds since her surgery for her obesity and this too will significantly help with her venous disease.  However she is continuing to have significant symptoms and therefore I  think she would be a good candidate for staged procedures on both legs.  I would start on the left side which has more significant symptoms.  She would require endovenous laser ablation of the left great saphenous vein with greater than 20 stab phlebectomies.  On the right she would require endovenous laser ablation of the right great saphenous vein with 10-20 stab phlebectomies.  I have  discussed the indications for endovenous laser ablation of the left and right GSV, that is to lower the pressure in the veins and potentially help relieve the symptoms from venous hypertension. I have also discussed alternative options including conservative treatment with leg elevation, compression therapy, exercise, avoiding prolonged sitting and standing, and weight management. I have discussed the potential complications of the procedure, including, but not limited to: bleeding, bruising, leg swelling, nerve injury, skin burns, significant pain from phlebitis, deep venous thrombosis, or failure of the vein to close.  I have also explained that venous insufficiency is a chronic disease, and that the patient is at risk for recurrent varicose veins in the future.  All of the patient's questions were encouraged and answered. They are agreeable to proceed.   I have discussed with the patient the indications for stab phlebectomy.  I have explained to the patient that that will have small scars from the stab incisions.  I explained that the other risks include leg swelling, bruising, bleeding, and phlebitis.  All the patient's questions were encouraged and answered and they are agreeable to proceed.  Hopefully we can schedule her procedure in the near future.  All of her questions were answered.   Deitra Mayo [facility] [phone]

## 2018-03-22 ENCOUNTER — Observation Stay (HOSPITAL_COMMUNITY)
Admission: EM | Admit: 2018-03-22 | Discharge: 2018-03-24 | Disposition: A | Discharge status: Home / Self Care | Payer: Managed Care, Other (non HMO) | Attending: Internal Medicine | Admitting: Internal Medicine

## 2018-03-22 ENCOUNTER — Other Ambulatory Visit: Payer: Self-pay

## 2018-03-22 ENCOUNTER — Emergency Department (HOSPITAL_COMMUNITY): Payer: Managed Care, Other (non HMO)

## 2018-03-22 ENCOUNTER — Encounter (HOSPITAL_COMMUNITY): Payer: Self-pay

## 2018-03-22 DIAGNOSIS — Z87891 Personal history of nicotine dependence: Secondary | ICD-10-CM | POA: Diagnosis not present

## 2018-03-22 DIAGNOSIS — R079 Chest pain, unspecified: Secondary | ICD-10-CM

## 2018-03-22 DIAGNOSIS — E785 Hyperlipidemia, unspecified: Secondary | ICD-10-CM | POA: Diagnosis not present

## 2018-03-22 DIAGNOSIS — Z79899 Other long term (current) drug therapy: Secondary | ICD-10-CM | POA: Diagnosis not present

## 2018-03-22 DIAGNOSIS — R42 Dizziness and giddiness: Secondary | ICD-10-CM | POA: Diagnosis not present

## 2018-03-22 DIAGNOSIS — R001 Bradycardia, unspecified: Secondary | ICD-10-CM | POA: Diagnosis not present

## 2018-03-22 DIAGNOSIS — I2510 Atherosclerotic heart disease of native coronary artery without angina pectoris: Secondary | ICD-10-CM

## 2018-03-22 DIAGNOSIS — Z23 Encounter for immunization: Secondary | ICD-10-CM | POA: Insufficient documentation

## 2018-03-22 DIAGNOSIS — E119 Type 2 diabetes mellitus without complications: Secondary | ICD-10-CM | POA: Diagnosis not present

## 2018-03-22 LAB — I-STAT TROPONIN, ED
TROPONIN I, POC: 0 ng/mL (ref 0.00–0.08)
TROPONIN I, POC: 0 ng/mL (ref 0.00–0.08)

## 2018-03-22 LAB — COMPREHENSIVE METABOLIC PANEL
ALT: 10 U/L (ref 0–44)
AST: 14 U/L — ABNORMAL LOW (ref 15–41)
Albumin: 4.4 g/dL (ref 3.5–5.0)
Alkaline Phosphatase: 62 U/L (ref 38–126)
Anion gap: 7 (ref 5–15)
BUN: 13 mg/dL (ref 6–20)
CHLORIDE: 105 mmol/L (ref 98–111)
CO2: 24 mmol/L (ref 22–32)
Calcium: 9.8 mg/dL (ref 8.9–10.3)
Creatinine, Ser: 0.77 mg/dL (ref 0.44–1.00)
Glucose, Bld: 94 mg/dL (ref 70–99)
POTASSIUM: 4.1 mmol/L (ref 3.5–5.1)
Sodium: 136 mmol/L (ref 135–145)
Total Bilirubin: 0.7 mg/dL (ref 0.3–1.2)
Total Protein: 8 g/dL (ref 6.5–8.1)

## 2018-03-22 LAB — CBC
HCT: 43.9 % (ref 36.0–46.0)
HCT: 36.9 % (ref 36.0–46.0)
Hemoglobin: 14.7 g/dL (ref 12.0–15.0)
Hemoglobin: 12.3 g/dL (ref 12.0–15.0)
MCH: 29.2 pg (ref 26.0–34.0)
MCH: 28.9 pg (ref 26.0–34.0)
MCHC: 33.5 g/dL (ref 30.0–36.0)
MCHC: 33.3 g/dL (ref 30.0–36.0)
MCV: 87.1 fL (ref 80.0–100.0)
MCV: 86.6 fL (ref 80.0–100.0)
NRBC: 0 % (ref 0.0–0.2)
PLATELETS: 274 10*3/uL (ref 150–400)
Platelets: 217 10*3/uL (ref 150–400)
RBC: 5.04 MIL/uL (ref 3.87–5.11)
RBC: 4.26 MIL/uL (ref 3.87–5.11)
RDW: 12.5 % (ref 11.5–15.5)
RDW: 12.3 % (ref 11.5–15.5)
WBC: 8 10*3/uL (ref 4.0–10.5)
WBC: 7 10*3/uL (ref 4.0–10.5)
nRBC: 0 % (ref 0.0–0.2)

## 2018-03-22 LAB — TROPONIN I

## 2018-03-22 LAB — URINALYSIS, ROUTINE W REFLEX MICROSCOPIC
Bilirubin Urine: NEGATIVE
Glucose, UA: NEGATIVE mg/dL
KETONES UR: NEGATIVE mg/dL
Nitrite: NEGATIVE
PROTEIN: NEGATIVE mg/dL
Specific Gravity, Urine: 1.009 (ref 1.005–1.030)
pH: 7 (ref 5.0–8.0)

## 2018-03-22 LAB — CREATININE, SERUM
CREATININE: 0.79 mg/dL (ref 0.44–1.00)
GFR calc Af Amer: >60 mL/min (ref >60)

## 2018-03-22 LAB — GLUCOSE, CAPILLARY: Glucose-Capillary: 104 mg/dL — ABNORMAL HIGH (ref 70–99)

## 2018-03-22 LAB — TSH: TSH: 0.934 u[IU]/mL (ref 0.350–4.500)

## 2018-03-22 LAB — LIPASE, BLOOD: Lipase: 26 U/L (ref 11–51)

## 2018-03-22 LAB — I-STAT BETA HCG BLOOD, ED (MC, WL, AP ONLY): I-stat hCG, quantitative: <5 m[IU]/mL (ref <5)

## 2018-03-22 LAB — D-DIMER, QUANTITATIVE (NOT AT ARMC): D DIMER QUANT: 0.49 ug{FEU}/mL (ref 0.00–0.50)

## 2018-03-22 MED ORDER — PREDNISONE 20 MG PO TABS
50.0000 mg | ORAL_TABLET | Freq: Four times a day (QID) | ORAL | Status: AC
  Administered 2018-03-22 – 2018-03-23 (×3): 50 mg via ORAL
  Filled 2018-03-22 (×3): qty 1

## 2018-03-22 MED ORDER — DIPHENHYDRAMINE HCL 25 MG PO CAPS
50.0000 mg | ORAL_CAPSULE | Freq: Once | ORAL | Status: AC
  Administered 2018-03-23: 50 mg via ORAL
  Filled 2018-03-22: qty 2

## 2018-03-22 MED ORDER — DIPHENHYDRAMINE HCL 50 MG/ML IJ SOLN: 50.0000 mg | Freq: Once | INTRAMUSCULAR | Status: AC

## 2018-03-22 MED ORDER — LORAZEPAM 2 MG/ML IJ SOLN
1.0000 mg | Freq: Once | INTRAMUSCULAR | Status: AC
  Administered 2018-03-22: 1 mg via INTRAVENOUS
  Filled 2018-03-22: qty 1

## 2018-03-22 MED ORDER — HYDROCODONE-ACETAMINOPHEN 5-325 MG PO TABS
1.0000 | ORAL_TABLET | Freq: Once | ORAL | Status: AC
  Administered 2018-03-22: 1 via ORAL
  Filled 2018-03-22: qty 1

## 2018-03-22 MED ORDER — SODIUM CHLORIDE 0.9 % IV BOLUS
1000.0000 mL | Freq: Once | INTRAVENOUS | Status: AC
Start: 2018-03-22 — End: 2018-03-22
  Administered 2018-03-22: 1000 mL via INTRAVENOUS

## 2018-03-22 MED ORDER — DIAZEPAM 5 MG PO TABS
5.0000 mg | ORAL_TABLET | Freq: Three times a day (TID) | ORAL | Status: DC | PRN
  Administered 2018-03-22: 5 mg via ORAL
  Filled 2018-03-22: qty 1

## 2018-03-22 MED ORDER — SODIUM CHLORIDE 0.9 % IV BOLUS
1000.0000 mL | Freq: Once | INTRAVENOUS | Status: AC
  Administered 2018-03-22: 1000 mL via INTRAVENOUS

## 2018-03-22 MED ORDER — INFLUENZA VAC SPLIT QUAD 0.5 ML IM SUSY
0.5000 mL | PREFILLED_SYRINGE | INTRAMUSCULAR | Status: AC
  Administered 2018-03-23: 0.5 mL via INTRAMUSCULAR

## 2018-03-22 MED ORDER — ASPIRIN EC 81 MG PO TBEC
81.0000 mg | DELAYED_RELEASE_TABLET | Freq: Every day | ORAL | Status: DC
  Administered 2018-03-23 – 2018-03-24 (×2): 81 mg via ORAL
  Filled 2018-03-22 (×2): qty 1

## 2018-03-22 MED ORDER — HEPARIN SODIUM (PORCINE) 5000 UNIT/ML IJ SOLN
5000.0000 [IU] | Freq: Three times a day (TID) | INTRAMUSCULAR | Status: DC
  Administered 2018-03-22 – 2018-03-24 (×4): 5000 [IU] via SUBCUTANEOUS
  Filled 2018-03-22 (×5): qty 1

## 2018-03-22 MED ORDER — ACETAMINOPHEN 325 MG PO TABS
650.0000 mg | ORAL_TABLET | ORAL | Status: DC | PRN
  Administered 2018-03-22: 650 mg via ORAL
  Filled 2018-03-22: qty 2

## 2018-03-22 MED ORDER — DIPHENHYDRAMINE HCL 50 MG/ML IJ SOLN: 25.0000 mg | Freq: Once | INTRAMUSCULAR | Status: DC

## 2018-03-22 MED ORDER — CYCLOBENZAPRINE HCL 10 MG PO TABS: 10.0000 mg | ORAL_TABLET | Freq: Three times a day (TID) | ORAL | Status: DC | PRN

## 2018-03-22 NOTE — ED Triage Notes (Addendum)
Pt from home c/o cp that started last night, described as pressure; pt states she thought it was an anxiety attack, took xanax, pain still present; pt states "this does not feel like and anxiety attack", endorses dizziness, numbness in hands going up R arm into neck, radiation of pain to back; denies sob; pt shivering, states she isn't cold; pt states yesterday she saw vein and vascular for varicose veins, says pain has been getting worse in her legs, especially the left; says pain is worse w/ standing

## 2018-03-22 NOTE — ED Provider Notes (Signed)
[facility] EMERGENCY DEPARTMENT Provider Note   CSN: [id number] Arrival date & time: 03/22/18  4128     History   Chief Complaint Chief Complaint  Patient presents with  . Chest Pain    HPI [name] [name] is a 37 y.o. female.  Pt presents to the ED today with CP.  She said it started last night.  She took a valium b/c she thought it was anxiety, but that did not help.  This feels different than her normal anxiety.  The pt feels tingly in her arms and in her legs. She said she was in line at [employer] to go grocery shopping and she felt like she was going to pass out.  Pt said her HR then was in the 42.     Past Medical History:  Diagnosis Date  . Abnormal uterine bleeding   . Amenorrhea   . Anemia   . Anxiety    panic attacks  . Diabetes mellitus without complication ([place])    type 2 ---5/26 no loner in pre diabetic range since bariatric sx 6 months ago  . Dysmenorrhea   . GERD (gastroesophageal reflux disease)    occasional - diet controlled and tums prn  . Hyperlipidemia    no meds -diet controlled  . Infertility, female   . Insulin resistance   . Migraine without aura   . Missed abortion    x 2 - both resolved without surgery  . Obesity   . PCOS (polycystic ovarian syndrome)   . PCOS (polycystic ovarian syndrome)   . PONV (postoperative nausea and vomiting)    pe rpatient ; scopalamine patch very helpful   . Sleep apnea   . SVD (spontaneous vaginal delivery)    x 1  . Urinary incontinence     Patient Active Problem List   Diagnosis Date Noted  . Varicose vein of leg 12/24/2017  . Venous reflux 12/24/2017  . Leukocytosis 09/29/2017  . Nausea 09/29/2017  . Anxiety 09/29/2017  . Pancreatitis 09/28/2017  . Dehydration 03/12/2017  . 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 Sleep apnea 01/09/2017  . Morbid obesity with BMI of 45.0-49.9, adult ([place]) 01/09/2017  . Healthcare maintenance 10/18/2016  . Type 2 diabetes  mellitus without complications ([place]) 78/67/6720  . LOW BACK PAIN, ACUTE 11/20/2006  . POLYCYSTIC OVARY 07/04/2006  . OBESITY, NOS 07/04/2006  . TOBACCO USE, QUIT 07/04/2006    Past Surgical History:  Procedure Laterality Date  . CHOLECYSTECTOMY    . COLONOSCOPY  2017   polyps  . CYSTOSCOPY N/A 10/29/2016   Procedure: CYSTOSCOPY;  Surgeon: Salvadore Dom, MD;  Location: [facility];  Service: Gynecology;  Laterality: N/A;  . DILATATION & CURRETTAGE/HYSTEROSCOPY WITH RESECTOCOPE N/A 10/17/2012   Procedure: DILATATION & CURETTAGE/HYSTEROSCOPY WITH RESECTOCOPE; Possible Polypectomy, Possible Resectoscopic Myomectomy.;  Surgeon: Floyce Stakes. Pamala Hurry, MD;  Location: [facility];  Service: Gynecology;  Laterality: N/A;  1 hr.  Marland Kitchen DILATION AND CURETTAGE OF UTERUS    . ENDOMETRIAL BIOPSY    . ESOPHAGOGASTRODUODENOSCOPY ENDOSCOPY  2017   Normal  . HYSTEROSCOPY W/D&C N/A 12/01/2013   Procedure: IUD Removal;  Surgeon: Claiborne Billings A. Pamala Hurry, MD;  Location: [facility];  Service: Gynecology;  Laterality: N/A;  90 min.  . INTRAUTERINE DEVICE (IUD) INSERTION N/A 10/17/2012   Procedure: INTRAUTERINE DEVICE (IUD) INSERTION; Mirena;  Surgeon: Floyce Stakes. Pamala Hurry, MD;  Location: [facility];  Service: Gynecology;  Laterality: N/A;  . LAPAROSCOPIC GASTRIC SLEEVE RESECTION N/A 02/19/2017  Procedure: LAPAROSCOPIC GASTRIC SLEEVE RESECTION, UPPER ENDOSCOPY;  Surgeon: Johnathan Hausen, MD;  Location: [facility];  Service: General;  Laterality: N/A;  . LAPAROSCOPIC SALPINGO OOPHERECTOMY Left 10/29/2016   Procedure: LAPAROSCOPIC SALPINGO OOPHORECTOMY With anterior uterine biopsy;  Surgeon: Salvadore Dom, MD;  Location: [facility];  Service: Gynecology;  Laterality: Left;  . MOUTH SURGERY     wisdom teeth     OB History    Gravida  3   Para  1   Term  1   Preterm      AB  2   Living  1     SAB  2   TAB      Ectopic      Multiple      Live Births  1            Home Medications    Prior to Admission medications     Medication Sig Start Date End Date Taking? Authorizing Provider  Calcium-Vitamin D-Vitamin K (CALCIUM SOFT CHEWS PO) Take by mouth daily.   Yes [provider]  cyclobenzaprine (FLEXERIL) 10 MG tablet Take 1 tablet (10 mg total) by mouth 3 (three) times daily as needed for muscle spasms. 07/30/17  Yes Danford, Valetta Fuller D, NP  diazepam (VALIUM) 5 MG tablet Take 1 tablet (5 mg total) by mouth every 8 (eight) hours as needed for anxiety. 10/18/16  Yes Danford, Valetta Fuller D, NP  ibuprofen (ADVIL,MOTRIN) 200 MG tablet Take 200 mg by mouth every 6 (six) hours as needed for headache or moderate pain.   Yes [provider]  IRON PO Take 45 mg by mouth every other day.   Yes [provider]  levonorgestrel (MIRENA) 20 MCG/24HR IUD 1 each by Intrauterine route once.   Yes [provider]  loratadine (CLARITIN) 10 MG tablet Take 10 mg by mouth daily as needed for allergies.   Yes [provider]  Multiple Vitamins-Iron (MULTIVITAMINS WITH IRON) TABS tablet Take 1 tablet by mouth 2 (two) times daily. CHEWABLE   Yes [provider]  Multiple Vitamins-Minerals (HAIR SKIN AND NAILS FORMULA PO) Take 1 tablet by mouth daily.   Yes [provider]  nystatin cream (MYCOSTATIN) Apply 1 application topically 2 (two) times daily. Apply to affected area BID for up to 7 days. Patient taking differently: Apply 1 application topically 2 (two) times daily as needed for dry skin (rash). A 05/30/17  Yes Salvadore Dom, MD  ondansetron (ZOFRAN) 4 MG tablet Take 1 tablet (4 mg total) by mouth every 8 (eight) hours as needed for nausea or vomiting. 10/02/17  Yes Mikhail, Clinical biochemist, DO  OVER THE COUNTER MEDICATION Take 500 mg by mouth 3 (three) times daily. Bariatric calcium 52m    Yes [provider]  pantoprazole (PROTONIX) 40 MG tablet Take 40 mg by mouth daily as needed (reflux).  02/07/17  Yes [provider]  blood glucose meter kit and supplies KIT  Dispense based on patient and insurance preference. Use up to four times daily as directed. (FOR ICD-9 250.00, 250.01). 10/18/16   Danford, KBerna Spare NP    Family History Family History  Problem Relation Age of Onset  . Diabetes Father   . Thyroid disease Father   . Hypertension Father   . Diabetes Maternal Grandmother   . Heart disease Maternal Grandmother   . Diabetes Maternal Grandfather   . Heart disease Maternal Grandfather   . Stroke Maternal Grandfather   . Diabetes Paternal Grandmother   .  Diabetes Paternal Grandfather   . Heart disease Paternal Grandfather   . Alcohol abuse Paternal Grandfather   . Diabetes Other   . Obesity Other   . Sleep apnea Other   . Hyperlipidemia Other   . Hypertension Other   . Cancer Other   . Heart disease Other   . Hypertension Mother     Social History Social History   Tobacco Use  . Smoking status: Former Smoker    Packs/day: 0.25    Years: 19.00    Pack years: 4.75    Types: Cigarettes    Last attempt to quit: 06/06/2016    Years since quitting: 1.7  . Smokeless tobacco: Never Used  Substance Use Topics  . Alcohol use: No    Frequency: Never  . Drug use: No     Allergies   Iohexol   Review of Systems Review of Systems  Cardiovascular: Positive for chest pain.  All other systems reviewed and are negative.    Physical Exam Updated Vital Signs BP 104/69   Pulse (!) 47   Temp 97.7 F (36.5 C) (Oral)   Resp 15   Ht _0  (1.727 m)   Wt 101.2 kg   SpO2 100%   BMI 33.91 kg/m   Physical Exam  Constitutional: She is oriented to person, place, and time. She appears well-developed and well-nourished.  HENT:  Head: Normocephalic and atraumatic.  Eyes: Pupils are equal, round, and reactive to light. EOM are normal.  Neck: Normal range of motion. Neck supple.  Cardiovascular: Regular rhythm, intact distal pulses and normal pulses. Bradycardia present.  Pulmonary/Chest: Effort normal and breath sounds normal.    Abdominal: Soft. Bowel sounds are normal.  Musculoskeletal: Normal range of motion.       Right lower leg: Normal.       Left lower leg: Normal.  Neurological: She is alert and oriented to person, place, and time.  Skin: Skin is warm. Capillary refill takes less than 2 seconds.  Psychiatric: Her behavior is normal. Her mood appears anxious.  Nursing note and vitals reviewed.    ED Treatments / Results  Labs (all labs ordered are listed, but only abnormal results are displayed) Labs Reviewed  COMPREHENSIVE METABOLIC PANEL - Abnormal; Notable for the following components:      Result Value   AST 14 (*)    All other components within normal limits  URINALYSIS, ROUTINE W REFLEX MICROSCOPIC - Abnormal; Notable for the following components:   APPearance HAZY (*)    Hgb urine dipstick LARGE (*)    Leukocytes, UA MODERATE (*)    Bacteria, UA RARE (*)    All other components within normal limits  CBC  D-DIMER, QUANTITATIVE (NOT AT [facility])  LIPASE, BLOOD  I-STAT TROPONIN, ED  I-STAT BETA HCG BLOOD, ED ([facility], [facility], [facility] ONLY)  I-STAT TROPONIN, ED    EKG EKG Interpretation  Date/Time:  Saturday March 22 2018 10:17:58 EST Ventricular Rate:  49 PR Interval:    QRS Duration: 117 QT Interval:  439 QTC Calculation: 397 R Axis:   84 Text Interpretation:  Sinus bradycardia Incomplete right bundle branch block Low voltage, precordial leads No significant change since last tracing Confirmed by Isla Pence [phone]) on 03/22/2018 10:38:08 AM   Radiology Dg Chest 2 View  Result Date: 03/22/2018 CLINICAL DATA:  Chest pain. EXAM: CHEST - 2 VIEW COMPARISON:  11/15/2016 FINDINGS: Both lungs are clear. Heart and mediastinum are within normal limits. Trachea is midline. Bone structures are  unremarkable. No large pleural effusions. IMPRESSION: No active cardiopulmonary disease. Electronically Signed   By: Markus Daft M.D.   On: 03/22/2018 10:59    Procedures Procedures (including critical care  time)  Medications Ordered in ED Medications  sodium chloride 0.9 % bolus 1,000 mL (0 mLs Intravenous Stopped 03/22/18 1116)  LORazepam (ATIVAN) injection 1 mg (1 mg Intravenous Given 03/22/18 1014)  sodium chloride 0.9 % bolus 1,000 mL (0 mLs Intravenous Stopped 03/22/18 1406)     Initial Impression / Assessment and Plan / ED Course  I have reviewed the triage vital signs and the nursing notes.  Pertinent labs & imaging results that were available during my care of the patient were reviewed by me and considered in my medical decision making (see chart for details).    Pt looks much less anxious after ativan, but she is still having some cp.  She is also having bradycardia and is symptomatic from her HR.  She said she feels dizzy and feels like she's going to pass out.  BP has remained stable.  Pt is not on any beta-blockers or any meds which would lower HR.  Pt d/w Dr. Radford Pax (cardiology) who will have Dr. Caryl Comes see pt and make recommendations.  Final Clinical Impressions(s) / ED Diagnoses   Final diagnoses:  Bradycardia    ED Discharge Orders    None       Isla Pence, MD 03/22/18 1514

## 2018-03-22 NOTE — ED Notes (Signed)
Pt unable to do standing orthostatic vital pt was dizzy upon standing.

## 2018-03-22 NOTE — H&P (Addendum)
Cardiology H&P:   [name] MRN: [id number]; DOB: 20-Apr-1981  Admit date: 03/22/2018 Date of Consult: 03/22/2018  Primary Care Provider: Esaw Grandchild, NP Primary Cardiologist: New (Dr. Caryl Comes)  Patient Profile:   [name] [name] is a 36 y.o. female with a hx of PCOS, morbid obesity s/p sleeve gastrectomy w/ over a 100 lb weight loss, T2DM and chronic venous insuffiencey not amendable w/ conservative measures w/ plans for endovenous laser ablation who is being seen today for the evaluation of near syncope and CP in the setting of bradycardia, at the request of Dr. Gilford Raid, Emergency Medicine.   History of Present Illness:   [name] [name] is a 37 y.o. female with a hx of PCOS, morbid obesity s/p sleeve gastrectomy w/ over a 100 lb weight loss, T2DM and chronic venous insuffiencey not amendable w/ conservative measures w/ plans for endovenous laser ablation who is being seen today for the evaluation of near syncope and CP, in the setting of bradycardia, at the request of Dr. Gilford Raid, Emergency Medicine.   Pt is followed by Dr. Scot Dock, vascular & vein specialist for chronic venous insuffiencey. Recently seen by him 2 days ago for f/u. Per record, she has a h/o significant discomfort in both legs and varicose veins which she had for years. She had a  noninvasive study that showed significant reflux in both great saphenous veins down to the knee.  She was fitted for thigh-high compression stockings with a gradient of 20 to 30 mmHg.  She was instructed to elevate her legs and take ibuprofen as needed for pain. At her recent 3 month f/u visit on 11/14, she endorsed continued symptoms and increased pain, despite treatment and regular use of thigh-high compression stockings. The plan is to now proceed with endovenous laser ablation in the near future.   She has no prior cardiac history but CRFs include T2DM and morbid obesity.  She also notes prior history of hyperlipidemia  however after her weight loss she was able to stop medications.  She also reports a prior history of tobacco use but quit 2 years ago.  Her family history is notable for coronary artery disease in both sets of grandparents.  Her mother has mitral valve prolapse but no known coronary disease.  No significant cardiac history in father or siblings.  She presents now to the [facility] ED w/ CC of near syncope and CP. Symptoms started last night. Pt initially thought symptoms were 2/2 anxiety so she took a valium but no improvement.  Today she had recurrent substernal chest discomfort with radiation in both arms as well as upper neck and right jaw.  Today while standing in line at [facility], she felt like she was going to pass out.  Had chest pain when this occurred.  Discomfort is described as pressure/tightness somewhat worse with exertion and also mild dyspnea.  She checked her pulse at [facility] and it was in the low 40s. She subsequently came to the ED.   EKG in the ED shows sinus bradycardia 49 bpm with incomplete RBBB and low voltage QRS complexes. Prior EKG in 2018 showed SR/ sinus arrhthymia w/ HR in the 80s and incomplete RBBB. ED labs are notable for normal D-dimer. CBC and CMP both unremarkable. POC troponin negative x 2. Beta hCG negative. UA with large amounts of hgb and moderate leukocytes. She is afebrile. Home meds reviewed, she is not on any AV nodal blocking agents. Her orthostatic VS are outlined below. She may  be a bit orthostatic given pulse increase >15 pt with position change. Pulse increased from 44 to 65 from lying to sitting. However no significant change in BP. As noted above, CBC negative. Hgb normal at 14. CMP does not suggest dehydration. SCr is normal at 0.77 and BUN also normal at 13. K is WNL at 4.1.     Orthostatic VS for the past 24 hrs:  BP- Lying Pulse- Lying BP- Sitting Pulse- Sitting BP- Standing at 0 minutes Pulse- Standing at 0 minutes  03/22/18 1427 119/72 (!) 44 119/82 65 120/82 52   03/22/18 1221 110/70 76 114/76 62 - -     Past Medical History:  Diagnosis Date  . Abnormal uterine bleeding   . Amenorrhea   . Anemia   . Anxiety    panic attacks  . Diabetes mellitus without complication ([place])    type 2 ---5/26 no loner in pre diabetic range since bariatric sx 6 months ago  . Dysmenorrhea   . GERD (gastroesophageal reflux disease)    occasional - diet controlled and tums prn  . Hyperlipidemia    no meds -diet controlled  . Infertility, female   . Insulin resistance   . Migraine without aura   . Missed abortion    x 2 - both resolved without surgery  . Obesity   . PCOS (polycystic ovarian syndrome)   . PCOS (polycystic ovarian syndrome)   . PONV (postoperative nausea and vomiting)    pe rpatient ; scopalamine patch very helpful   . Sleep apnea   . SVD (spontaneous vaginal delivery)    x 1  . Urinary incontinence     Past Surgical History:  Procedure Laterality Date  . CHOLECYSTECTOMY    . COLONOSCOPY  2017   polyps  . CYSTOSCOPY N/A 10/29/2016   Procedure: CYSTOSCOPY;  Surgeon: Salvadore Dom, MD;  Location: [facility];  Service: Gynecology;  Laterality: N/A;  . DILATATION & CURRETTAGE/HYSTEROSCOPY WITH RESECTOCOPE N/A 10/17/2012   Procedure: DILATATION & CURETTAGE/HYSTEROSCOPY WITH RESECTOCOPE; Possible Polypectomy, Possible Resectoscopic Myomectomy.;  Surgeon: Floyce Stakes. Pamala Hurry, MD;  Location: [facility];  Service: Gynecology;  Laterality: N/A;  1 hr.  Marland Kitchen DILATION AND CURETTAGE OF UTERUS    . ENDOMETRIAL BIOPSY    . ESOPHAGOGASTRODUODENOSCOPY ENDOSCOPY  2017   Normal  . HYSTEROSCOPY W/D&C N/A 12/01/2013   Procedure: IUD Removal;  Surgeon: Claiborne Billings A. Pamala Hurry, MD;  Location: [facility];  Service: Gynecology;  Laterality: N/A;  90 min.  . INTRAUTERINE DEVICE (IUD) INSERTION N/A 10/17/2012   Procedure: INTRAUTERINE DEVICE (IUD) INSERTION; Mirena;  Surgeon: Floyce Stakes. Pamala Hurry, MD;  Location: [facility];  Service: Gynecology;  Laterality: N/A;  . LAPAROSCOPIC GASTRIC  SLEEVE RESECTION N/A 02/19/2017   Procedure: LAPAROSCOPIC GASTRIC SLEEVE RESECTION, UPPER ENDOSCOPY;  Surgeon: Johnathan Hausen, MD;  Location: [facility];  Service: General;  Laterality: N/A;  . LAPAROSCOPIC SALPINGO OOPHERECTOMY Left 10/29/2016   Procedure: LAPAROSCOPIC SALPINGO OOPHORECTOMY With anterior uterine biopsy;  Surgeon: Salvadore Dom, MD;  Location: [facility];  Service: Gynecology;  Laterality: Left;  . MOUTH SURGERY     wisdom teeth     Home Medications:  Prior to Admission medications   Medication Sig Start Date End Date Taking? Authorizing Provider  Calcium-Vitamin D-Vitamin K (CALCIUM SOFT CHEWS PO) Take by mouth daily.   Yes [provider]  cyclobenzaprine (FLEXERIL) 10 MG tablet Take 1 tablet (10 mg total) by mouth 3 (three) times daily as needed for muscle spasms. 07/30/17  Yes Danford,  Berna Spare, NP  diazepam (VALIUM) 5 MG tablet Take 1 tablet (5 mg total) by mouth every 8 (eight) hours as needed for anxiety. 10/18/16  Yes Danford, Valetta Fuller D, NP  ibuprofen (ADVIL,MOTRIN) 200 MG tablet Take 200 mg by mouth every 6 (six) hours as needed for headache or moderate pain.   Yes [provider]  IRON PO Take 45 mg by mouth every other day.   Yes [provider]  levonorgestrel (MIRENA) 20 MCG/24HR IUD 1 each by Intrauterine route once.   Yes [provider]  loratadine (CLARITIN) 10 MG tablet Take 10 mg by mouth daily as needed for allergies.   Yes [provider]  Multiple Vitamins-Iron (MULTIVITAMINS WITH IRON) TABS tablet Take 1 tablet by mouth 2 (two) times daily. CHEWABLE   Yes [provider]  Multiple Vitamins-Minerals (HAIR SKIN AND NAILS FORMULA PO) Take 1 tablet by mouth daily.   Yes [provider]  nystatin cream (MYCOSTATIN) Apply 1 application topically 2 (two) times daily. Apply to affected area BID for up to 7 days. Patient taking differently: Apply 1 application topically 2 (two) times daily as needed for dry  skin (rash). A 05/30/17  Yes Salvadore Dom, MD  ondansetron (ZOFRAN) 4 MG tablet Take 1 tablet (4 mg total) by mouth every 8 (eight) hours as needed for nausea or vomiting. 10/02/17  Yes Mikhail, Clinical biochemist, DO  OVER THE COUNTER MEDICATION Take 500 mg by mouth 3 (three) times daily. Bariatric calcium '500mg'$     Yes [provider]  pantoprazole (PROTONIX) 40 MG tablet Take 40 mg by mouth daily as needed (reflux).  02/07/17  Yes [provider]  blood glucose meter kit and supplies KIT Dispense based on patient and insurance preference. Use up to four times daily as directed. (FOR ICD-9 250.00, 250.01). 10/18/16   Esaw Grandchild, NP    Inpatient Medications: Scheduled Meds:  Continuous Infusions:  PRN Meds:   Allergies:      Social History:   Social History   Socioeconomic History  . Marital status: Married    Spouse name: Not on file  . Number of children: Not on file  . Years of education: Not on file  . Highest education level: Not on file  Occupational History  . Not on file  Social Needs  . Financial resource strain: Not on file  . Food insecurity:    Worry: Not on file    Inability: Not on file  . Transportation needs:    Medical: Not on file    Non-medical: Not on file  Tobacco Use  . Smoking status: Former Smoker    Packs/day: 0.25    Years: 19.00    Pack years: 4.75    Types: Cigarettes    Last attempt to quit: 06/06/2016    Years since quitting: 1.7  . Smokeless tobacco: Never Used  Substance and Sexual Activity  . Alcohol use: No    Frequency: Never  . Drug use: No  . Sexual activity: Yes    Partners: Male    Birth control/protection: None, IUD  Lifestyle  . Physical activity:    Days per week: Not on file    Minutes per session: Not on file  . Stress: Not on file  Relationships  . Social connections:    Talks on phone: Not on file    Gets together: Not on file    Attends religious service: Not on file    Active member of club  or organization: Not on file    Attends meetings of clubs or organizations: Not on file    Relationship status: Not on file  . Intimate partner violence:    Fear of current or ex partner: Not on file    Emotionally abused: Not on file    Physically abused: Not on file    Forced sexual activity: Not on file  Other Topics Concern  . Not on file  Social History Narrative  . Not on file    Family History:    Family History  Problem Relation Age of Onset  . Diabetes Father   . Thyroid disease Father   . Hypertension Father   . Diabetes Maternal Grandmother   . Heart disease Maternal Grandmother   . Diabetes Maternal Grandfather   . Heart disease Maternal Grandfather   . Stroke Maternal Grandfather   . Diabetes Paternal Grandmother   . Diabetes Paternal Grandfather   . Heart disease Paternal Grandfather   . Alcohol abuse Paternal Grandfather   . Diabetes Other   . Obesity Other   . Sleep apnea Other   . Hyperlipidemia Other   . Hypertension Other   . Cancer Other   . Heart disease Other   . Hypertension Mother      ROS:  Please see the history of present illness.   All other ROS reviewed and negative.     Physical Exam/Data:   Vitals:   03/22/18 1415 03/22/18 1430 03/22/18 1445 03/22/18 1500  BP: 117/70 119/82 112/66 104/69  Pulse: (!) 55 72 (!) 56 (!) 47  Resp: '12 19 15 15  '$ Temp:      TempSrc:      SpO2: 100% 100% 100% 100%  Weight:      Height:        Intake/Output Summary (Last 24 hours) at 03/22/2018 1519 Last data filed at 03/22/2018 1406 Gross per 24 hour  Intake 1999.6 ml  Output -  Net 1999.6 ml   Filed Weights   03/22/18 1003  Weight: 101.2 kg   Body mass index is 33.91 kg/m.  General:  Obese well nourished, well developed, in no acute distress HEENT: normal Lymph: no adenopathy Neck: no JVD Endocrine:  No thryomegaly Vascular: No carotid bruits; FA pulses 2+ bilaterally without bruits  Cardiac:  Regular rhythm, bradycardia, no murmur    Lungs:  clear to auscultation bilaterally, no wheezing, rhonchi or rales  Abd: soft, nontender, no hepatomegaly  Ext: no edema Musculoskeletal:  Varicose veins noted bilaterally  Skin: warm and dry  Neuro:  CNs 2-12 intact, no focal abnormalities noted Psych:  Normal affect   EKG:  The EKG was personally reviewed and demonstrates:  Sinus brady 49 bpm, incomplete RBBB Telemetry:  Telemetry was personally reviewed and demonstrates:  Sinus bradycardia 40s-60s  Relevant CV Studies: None  Laboratory Data:  Chemistry Recent Labs  Lab 03/22/18 1007  NA 136  K 4.1  CL 105  CO2 24  GLUCOSE 94  BUN 13  CREATININE 0.77  CALCIUM 9.8  GFRNONAA >60  GFRAA >60  ANIONGAP 7    Recent Labs  Lab 03/22/18 1007  PROT 8.0  ALBUMIN 4.4  AST 14*  ALT 10  ALKPHOS 62  BILITOT 0.7   Hematology Recent Labs  Lab 03/22/18 1007  WBC 8.0  RBC 5.04  HGB 14.7  HCT 43.9  MCV 87.1  MCH 29.2  MCHC 33.5  RDW 12.5  PLT 274   Cardiac EnzymesNo results for input(s):  TROPONINI in the last 168 hours.  Recent Labs  Lab 03/22/18 1024 03/22/18 1335  TROPIPOC 0.00 0.00    BNPNo results for input(s): BNP, PROBNP in the last 168 hours.  DDimer  Recent Labs  Lab 03/22/18 1007  DDIMER 0.49    Radiology/Studies:  Dg Chest 2 View  Result Date: 03/22/2018 CLINICAL DATA:  Chest pain. EXAM: CHEST - 2 VIEW COMPARISON:  11/15/2016 FINDINGS: Both lungs are clear. Heart and mediastinum are within normal limits. Trachea is midline. Bone structures are unremarkable. No large pleural effusions. IMPRESSION: No active cardiopulmonary disease. Electronically Signed   By: Markus Daft M.D.   On: 03/22/2018 10:59    Assessment and Plan:   [name] [name] is a 37 y.o. female with a hx of PCOS, morbid obesity s/p sleeve gastrectomy w/ over a 100 lb weight loss, T2DM and chronic venous insuffiencey not amendable w/ conservative measures w/ plans for endovenous laser ablation in the near future, who is being  seen today for the evaluation of near syncope and CP in the setting of bradycardia, at the request of Dr. Gilford Raid, Emergency Medicine.   1.  Chest pain, sinus bradycardia and near syncope: her labs are unremarkable.  Given the nature of her symptoms including substernal chest pressure/tightness with radiation to bilateral arms, neck and jaw as well as bradycardia and her cardiac risk factors including type 2 diabetes, prior history of hyperlipidemia, tobacco use as well as family history of CAD, patient will need ischemic evaluation to rule out acute coronary syndrome.  Point-of-care troponins are negative x2.  Given her resting bradycardia she would be a good candidate for coronary CTA to assess for coronary artery disease.  The only limiting factor would possibly be her obesity.  We can discuss this with Dr. Meda Coffee tomorrow.  If not a candidate for coronary CTA due to body habitus would recommend nuclear stress test.  For bradycardia we will also check thyroid function to rule out hypothyroidism.  She does not appear to be on any AV nodal blocking agents.  We will continue to monitor on telemetry and will also obtain a 2D echo to assess LV function, wall motion and valvular anatomy.   For questions or updates, please contact [place] Please consult [email or web address] for contact info under     Signed, Lyda Jester, PA-C  03/22/2018 3:19 PM  Chest pain syndrome with typical and atypical features  Morbid obesity status post gastric sleeve with "posterior repair of a hiatal hernia."  Orthostatic lightheadedness  Diabetes resolved post gastric sleeve  PCOS  Anxiety disorder   Patient has a long-standing history of mild orthostatic and shower intolerance which was considerably worsened following her gastric sleeve procedure.  She has a history of "panic attacks "which have been manifested with chest tightness.  These symptoms were considerably worse today and yesterday but similar at  their onset with which she was familiar  She has not had much reflux problems since her laparoscopic repair.  She is at intermediate risk for coronary disease with her diabetes.  I could not find an up-to-date in association between PCOS premature coronary disease.  It is reasonable to exclude coronary disease, most easily done through imaging.  We will arrange for CTA.  Her orthostasis was a striking part of all of this.  Contextually, and occurred having not eaten last night when she returned from work or having eaten this morning.  There are some symptoms that antedate her gastric sleeve; however, it is worse  since then.  We have seen patients in whom gastrointestinal procedures have been associated with worsening of autonomic function in the emergency of orthostatic intolerance.  I have reviewed that physiology with her.  She may well be right in that panic set this whole thing off with resultant reflux associated chest pain.  She is advised to work on her anxiety disorder.  In the absence of hypertension, and with pulses being the same bilaterally, it is unlikely that an aortic dissection problem is responsible for her symptoms

## 2018-03-22 NOTE — ED Notes (Signed)
Cardiology at bedside speaking with patient

## 2018-03-22 NOTE — ED Notes (Signed)
Pt states that she was at [place] this morning and feeling dizzy reports that she felt like she was going to pass out and checked her watch/tracker which stated that her HR was 42. Pt reports her son said she did not look like herself at that time and brought her to the ED.

## 2018-03-22 NOTE — ED Provider Notes (Signed)
Blood pressure 104/69, pulse (!) 47, temperature 97.7 F (36.5 C), temperature source Oral, resp. rate 15, height 5\' 8"  (1.727 m), weight 101.2 kg, SpO2 100 %.  Assuming care from Dr. Gilford Raid.  In short, [name] [name] is a 37 y.o. female with a chief complaint of Chest Pain .  Refer to the original H&P for additional details.  The current plan of care is to f/u on Cardiology recommendations.  04:00 PM Spoke with Cardiology PA regarding case. Anticipate admit. They will discuss with the team and reassess.   4:54 PM Discussed patient's case with Cardiology to request admission. Patient and family (if present) updated with plan. Care transferred to Cardiology service.  I reviewed all nursing notes, vitals, pertinent old records, EKGs, labs, imaging (as available).    Margette Fast, MD 03/22/18 [phone]

## 2018-03-23 ENCOUNTER — Observation Stay (HOSPITAL_BASED_OUTPATIENT_CLINIC_OR_DEPARTMENT_OTHER): Payer: Managed Care, Other (non HMO)

## 2018-03-23 ENCOUNTER — Observation Stay (HOSPITAL_COMMUNITY): Payer: Managed Care, Other (non HMO)

## 2018-03-23 ENCOUNTER — Encounter (HOSPITAL_COMMUNITY): Payer: Self-pay | Admitting: Radiology

## 2018-03-23 DIAGNOSIS — E7800 Pure hypercholesterolemia, unspecified: Secondary | ICD-10-CM | POA: Diagnosis not present

## 2018-03-23 DIAGNOSIS — R001 Bradycardia, unspecified: Secondary | ICD-10-CM | POA: Diagnosis not present

## 2018-03-23 DIAGNOSIS — R0789 Other chest pain: Secondary | ICD-10-CM | POA: Diagnosis not present

## 2018-03-23 DIAGNOSIS — R079 Chest pain, unspecified: Secondary | ICD-10-CM

## 2018-03-23 DIAGNOSIS — E785 Hyperlipidemia, unspecified: Secondary | ICD-10-CM

## 2018-03-23 DIAGNOSIS — I2510 Atherosclerotic heart disease of native coronary artery without angina pectoris: Secondary | ICD-10-CM

## 2018-03-23 LAB — ECHOCARDIOGRAM COMPLETE
HEIGHTINCHES: 68 in
Weight: 3560 oz

## 2018-03-23 LAB — TROPONIN I

## 2018-03-23 LAB — BASIC METABOLIC PANEL
Anion gap: 10 (ref 5–15)
BUN: 12 mg/dL (ref 6–20)
CO2: 20 mmol/L — ABNORMAL LOW (ref 22–32)
Calcium: 9.9 mg/dL (ref 8.9–10.3)
Chloride: 108 mmol/L (ref 98–111)
Creatinine, Ser: 0.89 mg/dL (ref 0.44–1.00)
GFR calc Af Amer: >60 mL/min (ref >60)
GLUCOSE: 145 mg/dL — AB (ref 70–99)
Potassium: 4.4 mmol/L (ref 3.5–5.1)
SODIUM: 138 mmol/L (ref 135–145)

## 2018-03-23 LAB — LIPID PANEL
CHOLESTEROL: 207 mg/dL — AB (ref 0–200)
HDL: 60 mg/dL (ref >40)
LDL Cholesterol: 139 mg/dL — ABNORMAL HIGH (ref 0–99)
Total CHOL/HDL Ratio: 3.5 RATIO
Triglycerides: 41 mg/dL (ref <150)
VLDL: 8 mg/dL (ref 0–40)

## 2018-03-23 LAB — GLUCOSE, CAPILLARY: Glucose-Capillary: 128 mg/dL — ABNORMAL HIGH (ref 70–99)

## 2018-03-23 MED ORDER — NITROGLYCERIN 0.4 MG SL SUBL
SUBLINGUAL_TABLET | SUBLINGUAL | Status: AC
  Administered 2018-03-23: 0.8 mg via SUBLINGUAL
  Filled 2018-03-23: qty 2

## 2018-03-23 MED ORDER — NITROGLYCERIN 0.4 MG SL SUBL
0.8000 mg | SUBLINGUAL_TABLET | Freq: Once | SUBLINGUAL | Status: AC
  Administered 2018-03-23: 0.8 mg via SUBLINGUAL

## 2018-03-23 MED ORDER — SODIUM CHLORIDE 0.9 % IV BOLUS
1000.0000 mL | Freq: Once | INTRAVENOUS | Status: AC
  Administered 2018-03-23: 1000 mL via INTRAVENOUS

## 2018-03-23 MED ORDER — ASPIRIN 81 MG PO TBEC: 81.0000 mg | DELAYED_RELEASE_TABLET | Freq: Every day | ORAL | Status: DC

## 2018-03-23 MED ORDER — IOPAMIDOL (ISOVUE-370) INJECTION 76%
INTRAVENOUS | Status: AC
  Administered 2018-03-23: 80 mL
  Filled 2018-03-23: qty 100

## 2018-03-23 MED ORDER — SODIUM CHLORIDE 0.9 % IV SOLN
INTRAVENOUS | Status: DC
  Administered 2018-03-23 – 2018-03-24 (×2): via INTRAVENOUS

## 2018-03-23 NOTE — Discharge Summary (Addendum)
Discharge Summary    Patient ID: IEISHA GAO MRN: [id number]; DOB: [birth year]  Admit date: 03/22/2018 Discharge date: 03/24/2018  Primary Care Provider: Esaw Grandchild, NP  Primary Cardiologist: New (Dr. Caryl Comes)  Discharge Diagnoses    Principal Problem:   Bradycardia Active Problems:   Chest pain   HLD (hyperlipidemia)   Coronary atherosclerosis   Allergies Allergies  Allergen Reactions  . Iohexol Hives     Code: HIVES, Desc: NEED 13 HR PREDISONE AND BENADRYL  BE FOR IV CONTRAST GIVEN* /DR OLSON PT DEVELOPED HIVES AND ROOF OF MOUTH BEGIN "ITCHING"", Onset Date: [id number]     Diagnostic Studies/Procedures    Coronary CTA w/ Morphology and Calcium Score 03/23/18 Aorta:  Normal size.  No calcifications.  No dissection.  Aortic Valve:  Trileaflet.  No calcifications.  Coronary Arteries:  Normal coronary origin.  Right dominance.  RCA is a large dominant artery that gives rise to PDA and PLVB. There is no plaque.  Left main is a large artery that gives rise to LAD and LCX arteries. Left main has no plaque.  LAD is a large vessel that gives rise to two diagonal arteries. Proximal LAD has mild calcified plaque with associated stenosis 0-25%. Mid LAD has a mild non-calcified plaque with associated stenosis 0-25%.  LCX is a small non-dominant artery that gives rise to one small OM1 branch. There is no plaque.  Other findings:  Normal pulmonary vein drainage into the left atrium.  Normal let atrial appendage without a thrombus.  IMPRESSION: 1. Coronary calcium score of 46. This was 67 percentile for age and sex matched control.  2. Normal coronary origin with right dominance.  3. 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 Aggressive medical management is recommended.  4. Mildly dilated pulmonary artery measuring 32 mm suggestive of pulmonary hypertension.  No acute abnormality involving the non-cardiac structures.    2D  Echo 03/23/18 LV EF: 55% -   60% Study Conclusions  - Left ventricle: The cavity size was normal. Systolic function was   normal. The estimated ejection fraction was in the range of 55%   to 60%. Wall motion was normal; there were no regional wall   motion abnormalities. Left ventricular diastolic function   parameters were normal. - Left atrium: The atrium was mildly dilated. - Atrial septum: No defect or patent foramen ovale was identified.  History of Present Illness     [name] [name] is a 38 y.o. female with a hx of PCOS, morbid obesity s/p sleeve gastrectomy w/ over a 100 lb weight loss, T2DM and chronic venous insuffiencey not amendable w/ conservative measures w/ plans for endovenous laser ablation who is being seen today for the evaluation of near syncope and CP in the setting of bradycardia, at the request of Dr. Gilford Raid, Emergency Medicine.   Pt is followed by Dr. Scot Dock, vascular & vein specialist for chronic venous insuffiencey. Recently seen by him 2 days ago for f/u. Per record, she has a h/o significant discomfort in both legs and varicose veins which she had for years. She had a  noninvasive study that showed significant reflux in both great saphenous veins down to the knee. She was fitted for thigh-high compression stockings with a gradient of 20 to 30 mmHg. She was instructed to elevate her legs and take ibuprofen as needed for pain. At her recent 3 month f/u visit on 11/14, she endorsed continued symptoms and increased pain, despite treatment and regular use of thigh-high  compression stockings. The plan is to now proceed with endovenous laser ablation in the near future.   She has no prior cardiac history but CRFs include T2DM and morbid obesity.  She also notes prior history of hyperlipidemia however after her weight loss she was able to stop medications.  She also reports a prior history of tobacco use but quit 2 years ago.  Her family history is notable for coronary  artery disease in both sets of grandparents.  Her mother has mitral valve prolapse but no known coronary disease.  No significant cardiac history in father or siblings.  She presented to the [facility] ED on 03/23/18 w/ CC of near syncope and CP. Symptoms started the night prior. Pt initially thought symptoms were 2/2 anxiety so she took a valium but no improvement.  the following day on 03/22/18, she had recurrent substernal chest discomfort with radiation in both arms as well as upper neck and right jaw.  Later in the day while standing in line at [facility], she felt like she was going to pass out.  She had chest pain when this occurred. Discomfort was described as pressure/tightness somewhat worse with exertion and also mild dyspnea.  She checked her pulse at [facility] and it was in the low 40s. She subsequently came to the ED.   ED Course: EKG in the ED shows sinus bradycardia 49 bpm with incomplete RBBB and low voltage QRS complexes. Prior EKG in 2018 showed SR/ sinus arrhthymia w/ HR in the 80s and incomplete RBBB. ED labs are notable for normal D-dimer. CBC and CMP both unremarkable. POC troponin negative x 2. Beta hCG negative. UA with large amounts of hgb and moderate leukocytes. She is afebrile. Home meds reviewed, she is not on any AV nodal blocking agents. Her orthostatic VS are outlined below. She may be a bit orthostatic given pulse increase >15 pt with position change. Pulse increased from 44 to 65 from lying to sitting. However no significant change in BP. As noted above, CBC negative. Hgb normal at 14. CMP does not suggest dehydration. SCr is normal at 0.77 and BUN also normal at 13. K is WNL at 4.1.   Pt was seen by Dr. Caryl Comes in the ED who recommended admission for overnight observation and further testing.   Hospital Course     Pt was admitted to telemetry. Cardiac enzymes were cycled x 3 and remained negative. TSH was checked given bradycardia and was normal. Lipid panel showed elevated LDL at 139  mg/dL. Given her CP and bradycardia, she underwent coronary CTA to assess for CAD. She was premedicated w/ prednisone and benadryl given history of contrast allergy. Coronary CTA demonstrated coronary calcium score 46 which placed the patient at 28 percentile for age and sex matched control, normal coronary origin was right dominance, mild 0 to 25% disease in proximal to mid LAD, otherwise no significant coronary artery disease. 2D echo was also performed and demonstrated EF 55 to 60%, no regional wall motion normality.   Pt was last seen and examined by Dr. Caryl Comes on 03/23/2018 who determined she was stable for discharge home.  According to the Dr. Caryl Comes, patient had dizziness upon standing.  There was also significant increase in the heart rate with standing as well.  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  It is also important for her to address her panic and anxiety.  She may ultimately benefit  from medication such as Florinef or ProAmatine or abdominal binders and compression stocking.  She is encouraged to follow-up with her PCP.  Consultants: none   Discharge Vitals Blood pressure 116/78, pulse (!) 58, temperature 98.1 F (36.7 C), temperature source Oral, resp. rate 19, height _0  (1.727 m), weight 100.9 kg, SpO2 98 %.  Filed Weights   03/22/18 1003 03/22/18 1745 03/23/18 0348  Weight: 101.2 kg 101.3 kg 100.9 kg   General:  Obese well nourished, well developed, in no acute distress HEENT: normal Lymph: no adenopathy Neck: no JVD Endocrine:  No thryomegaly Vascular: No carotid bruits; FA pulses 2+ bilaterally without bruits  Cardiac:  Regular rhythm, regular rate, no murmur  Lungs:  clear to auscultation bilaterally, no wheezing, rhonchi or rales  Abd: soft, nontender, no hepatomegaly  Ext: no edema Musculoskeletal:  Varicose veins noted bilaterally  Skin: warm and dry  Neuro:  CNs  2-12 intact, no focal abnormalities noted Psych:  Normal affect   Labs & Radiologic Studies    CBC Recent Labs    03/22/18 1007 03/22/18 1834  WBC 8.0 7.0  HGB 14.7 12.3  HCT 43.9 36.9  MCV 87.1 86.6  PLT 274 540   Basic Metabolic Panel Recent Labs    03/22/18 1007 03/22/18 1834 03/23/18 0622  NA 136  --  138  K 4.1  --  4.4  CL 105  --  108  CO2 24  --  20*  GLUCOSE 94  --  145*  BUN 13  --  12  CREATININE 0.77 0.79 0.89  CALCIUM 9.8  --  9.9   Liver Function Tests Recent Labs    03/22/18 1007  AST 14*  ALT 10  ALKPHOS 62  BILITOT 0.7  PROT 8.0  ALBUMIN 4.4   Recent Labs    03/22/18 1007  LIPASE 26   Cardiac Enzymes Recent Labs    03/22/18 1834 03/23/18 0021 03/23/18 0622  TROPONINI <0.03 <0.03 <0.03   BNP Invalid input(s): POCBNP D-Dimer Recent Labs    03/22/18 1007  DDIMER 0.49   Hemoglobin A1C No results for input(s): HGBA1C in the last 72 hours. Fasting Lipid Panel Recent Labs    03/23/18 0623  CHOL 207*  HDL 60  LDLCALC 139*  TRIG 41  CHOLHDL 3.5   Thyroid Function Tests Recent Labs    03/22/18 1834  TSH 0.934   _____________  Dg Chest 2 View  Result Date: 03/22/2018 CLINICAL DATA:  Chest pain. EXAM: CHEST - 2 VIEW COMPARISON:  11/15/2016 FINDINGS: Both lungs are clear. Heart and mediastinum are within normal limits. Trachea is midline. Bone structures are unremarkable. No large pleural effusions. IMPRESSION: No active cardiopulmonary disease. Electronically Signed   By: Markus Daft M.D.   On: 03/22/2018 10:59   Ct Coronary Morph W/cta Cor W/score W/ca W/cm &/or Wo/cm  Addendum Date: 03/23/2018   ADDENDUM REPORT: 03/23/2018 12:21 CLINICAL DATA:  37 year old female with atypical chest pain and bradycardia. EXAM: Cardiac/Coronary  CT TECHNIQUE: The patient was scanned on a [employer]. FINDINGS: A 120 kV prospective scan was triggered in the descending thoracic aorta at 111 HU's. Axial non-contrast 3 mm slices  were carried out through the heart. The data set was analyzed on a dedicated work station and scored using the [place]. Gantry rotation speed was 250 msecs and collimation was .6 mm. No beta blockade and 0.8 mg of sl NTG was given. The 3D data set was reconstructed in 5% intervals of  the 67-82 % of the R-R cycle. Diastolic phases were analyzed on a dedicated work station using MPR, MIP and VRT modes. The patient received 80 cc of contrast. Aorta:  Normal size.  No calcifications.  No dissection. Aortic Valve:  Trileaflet.  No calcifications. Coronary Arteries:  Normal coronary origin.  Right dominance. RCA is a large dominant artery that gives rise to PDA and PLVB. There is no plaque. Left main is a large artery that gives rise to LAD and LCX arteries. Left main has no plaque. LAD is a large vessel that gives rise to two diagonal arteries. Proximal LAD has mild calcified plaque with associated stenosis 0-25%. Mid LAD has a mild non-calcified plaque with associated stenosis 0-25%. LCX is a small non-dominant artery that gives rise to one small OM1 branch. There is no plaque. Other findings: Normal pulmonary vein drainage into the left atrium. Normal let atrial appendage without a thrombus. IMPRESSION: 1. Coronary calcium score of 46. This was 66 percentile for age and sex matched control. 2. Normal coronary origin with right dominance. 3. 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 Aggressive medical management is recommended. 4. Mildly dilated pulmonary artery measuring 32 mm suggestive of pulmonary hypertension. Electronically Signed   By: Ena Dawley   On: 03/23/2018 12:21   Result Date: 03/23/2018 EXAM: OVER-READ INTERPRETATION  CT CHEST The following report is an over-read performed by radiologist Dr. Markus Daft of [facility] on 03/23/2018. This over-read does not include interpretation of cardiac or coronary anatomy or pathology. The coronary calcium score/coronary CTA  interpretation by the cardiologist is attached. COMPARISON:  Chest radiograph 03/22/2018 FINDINGS: Vascular: Normal caliber of the visualized thoracic aorta. Central pulmonary arteries are patent. Heart size is normal without significant pericardial fluid. Mediastinum/Nodes: Mediastinal structures are unremarkable without lymph node enlargement. Lungs/Pleura: Lung bases are clear.  No large pleural effusions. Upper Abdomen: Surgical changes in the stomach and history of gastric sleeve procedure. No acute abnormality in the upper abdomen. Musculoskeletal: No acute abnormality. IMPRESSION: No acute abnormality involving the non-cardiac structures. Electronically Signed: By: Markus Daft M.D. On: 03/23/2018 10:47   Disposition   Pt is being discharged home today in good condition.  Follow-up Plans & Appointments    Follow-up Information    Danford, Berna Spare, NP. Schedule an appointment as soon as possible for a visit.   Specialty:  Family Medicine Contact information: [place] [address] [phone]        Deboraha Sprang, MD Follow up.   Specialty:  Cardiology Why:  followup as needed.  Contact information: [address] [place] [address] [phone]          Discharge Instructions    Diet - low sodium heart healthy   Complete by:  As directed    Diet - low sodium heart healthy   Complete by:  As directed    Increase activity slowly   Complete by:  As directed    Increase activity slowly   Complete by:  As directed       Discharge Medications   Allergies as of 03/23/2018      Reactions   Iohexol Hives    Code: HIVES, Desc: NEED 13 HR PREDISONE AND BENADRYL  BE FOR IV CONTRAST GIVEN* /DR OLSON PT DEVELOPED HIVES AND ROOF OF MOUTH BEGIN "ITCHING"", Onset Date: [id number]      Medication List    TAKE these medications   aspirin 81  MG EC tablet Take 1 tablet (81 mg total) by mouth daily. Start taking on:  03/24/2018   blood  glucose meter kit and supplies Kit Dispense based on patient and insurance preference. Use up to four times daily as directed. (FOR ICD-9 250.00, 250.01).   CALCIUM SOFT CHEWS PO Take by mouth daily.   cyclobenzaprine 10 MG tablet Commonly known as:  FLEXERIL Take 1 tablet (10 mg total) by mouth 3 (three) times daily as needed for muscle spasms.   diazepam 5 MG tablet Commonly known as:  VALIUM Take 1 tablet (5 mg total) by mouth every 8 (eight) hours as needed for anxiety.   HAIR SKIN AND NAILS FORMULA PO Take 1 tablet by mouth daily.   ibuprofen 200 MG tablet Commonly known as:  ADVIL,MOTRIN Take 200 mg by mouth every 6 (six) hours as needed for headache or moderate pain.   IRON PO Take 45 mg by mouth every other day.   levonorgestrel 20 MCG/24HR IUD Commonly known as:  MIRENA 1 each by Intrauterine route once.   loratadine 10 MG tablet Commonly known as:  CLARITIN Take 10 mg by mouth daily as needed for allergies.   multivitamins with iron Tabs tablet Take 1 tablet by mouth 2 (two) times daily. CHEWABLE   nystatin cream Commonly known as:  MYCOSTATIN Apply 1 application topically 2 (two) times daily. Apply to affected area BID for up to 7 days. What changed:    when to take this  reasons to take this  additional instructions   ondansetron 4 MG tablet Commonly known as:  ZOFRAN Take 1 tablet (4 mg total) by mouth every 8 (eight) hours as needed for nausea or vomiting.   OVER THE COUNTER MEDICATION Take 500 mg by mouth 3 (three) times daily. Bariatric calcium 582m   pantoprazole 40 MG tablet Commonly known as:  PROTONIX Take 40 mg by mouth daily as needed (reflux).        Acute coronary syndrome (MI, NSTEMI, STEMI, etc) this admission?: No.    Outstanding Labs/Studies   None  Duration of Discharge Encounter   Greater than 30 minutes including physician time.  Signed, BLyda Jester PA-C 03/24/2018, 12:29 PM

## 2018-03-23 NOTE — Progress Notes (Signed)
Discharge cancelled due to persistent dizziness and elevated HR with walking. Given 1 L IVF earlier, HR improving. Will do continuous IVF 165ml/HR overnight. Compression stocking. Likely D/C tomorrow.   Hilbert Corrigan PA Pager: [phone]

## 2018-03-23 NOTE — Progress Notes (Signed)
Progress Note  Patient Name: [name] [name] [facility] Date of Encounter: 03/23/2018  Primary Cardiologist: SK  Primary Electrophysiologist:    Patient Profile     37 y.o. female admitted with atypical/typical chest pain and episodes of associated lightheadedness with notable bradycardia  Subjective   Dizzy upon standing but no chest pain  Inpatient Medications    Scheduled Meds: . aspirin EC  81 mg Oral Daily  . heparin  5,000 Units Subcutaneous Q8H   Continuous Infusions:  PRN Meds: acetaminophen, cyclobenzaprine, diazepam   Vital Signs    Vitals:   03/23/18 0822 03/23/18 0846 03/23/18 0934 03/23/18 1018  BP: 118/74 (!) 166/96 133/87 118/76  Pulse:      Resp:      Temp:      TempSrc:      SpO2:      Weight:      Height:        Intake/Output Summary (Last 24 hours) at 03/23/2018 1131 Last data filed at 03/23/2018 0200 Gross per 24 hour  Intake 1119.99 ml  Output 650 ml  Net 469.99 ml   Filed Weights   03/22/18 1003 03/22/18 1745 03/23/18 0348  Weight: 101.2 kg 101.3 kg 100.9 kg    Telemetry    Sinus rhythm with variable rates- Personally Reviewed  ECG      - Personally Reviewed  Physical Exam  Well developed and nourished in no acute distress HENT normal Neck supple with JVP-flat Clear Regular rate and rhythm, no murmurs or gallops Abd-soft with active BS No Clubbing cyanosis edema Skin-warm and dry A & Oriented  Grossly normal sensory and motor function    Labs    Chemistry Recent Labs  Lab 03/22/18 1007 03/22/18 1834 03/23/18 0622  NA 136  --  138  K 4.1  --  4.4  CL 105  --  108  CO2 24  --  20*  GLUCOSE 94  --  145*  BUN 13  --  12  CREATININE 0.77 0.79 0.89  CALCIUM 9.8  --  9.9  PROT 8.0  --   --   ALBUMIN 4.4  --   --   AST 14*  --   --   ALT 10  --   --   ALKPHOS 62  --   --   BILITOT 0.7  --   --   GFRNONAA >60 >60 >60  GFRAA >60 >60 >60  ANIONGAP 7  --  10     Hematology Recent Labs  Lab 03/22/18 1007  03/22/18 1834  WBC 8.0 7.0  RBC 5.04 4.26  HGB 14.7 12.3  HCT 43.9 36.9  MCV 87.1 86.6  MCH 29.2 28.9  MCHC 33.5 33.3  RDW 12.5 12.3  PLT 274 217    Cardiac Enzymes Recent Labs  Lab 03/22/18 1834 03/23/18 0021 03/23/18 0622  TROPONINI <0.03 <0.03 <0.03    Recent Labs  Lab 03/22/18 1024 03/22/18 1335  TROPIPOC 0.00 0.00     BNPNo results for input(s): BNP, PROBNP in the last 168 hours.   DDimer  Recent Labs  Lab 03/22/18 1007  DDIMER 0.49     Radiology    Dg Chest 2 View  Result Date: 03/22/2018 CLINICAL DATA:  Chest pain. EXAM: CHEST - 2 VIEW COMPARISON:  11/15/2016 FINDINGS: Both lungs are clear. Heart and mediastinum are within normal limits. Trachea is midline. Bone structures are unremarkable. No large pleural effusions. IMPRESSION: No active cardiopulmonary disease. Electronically Signed   By:  Markus Daft M.D.   On: 03/22/2018 10:59   Ct Coronary Morph W/cta Cor W/score W/ca W/cm &/or Wo/cm  Result Date: 03/23/2018 EXAM: OVER-READ INTERPRETATION  CT CHEST The following report is an over-read performed by radiologist Dr. Markus Daft of [facility] on 03/23/2018. This over-read does not include interpretation of cardiac or coronary anatomy or pathology. The coronary calcium score/coronary CTA interpretation by the cardiologist is attached. COMPARISON:  Chest radiograph 03/22/2018 FINDINGS: Vascular: Normal caliber of the visualized thoracic aorta. Central pulmonary arteries are patent. Heart size is normal without significant pericardial fluid. Mediastinum/Nodes: Mediastinal structures are unremarkable without lymph node enlargement. Lungs/Pleura: Lung bases are clear.  No large pleural effusions. Upper Abdomen: Surgical changes in the stomach and history of gastric sleeve procedure. No acute abnormality in the upper abdomen. Musculoskeletal: No acute abnormality. IMPRESSION: No acute abnormality involving the non-cardiac structures. Electronically Signed    By: Markus Daft M.D.   On: 03/23/2018 10:47    Cardiac Studies   CTA negative     Assessment & Plan    Chest pain-atypical/typical with negative CTA  Orthostatic intolerance   Gastric sleeve surgery  Panic disorder/anxiety  Sinus bradycardia    Would treat pain empirically for reflux.  She was not able to stand for 3 minutes because of dizziness.  There was a significant increase in heart rate from the 40s--80s.  Blood pressure was stable.  Data are at least consistent with the clinical observation of orthostatic intolerance.  I reviewed the physiology of orthostatic intolerance and encouraged fluid intake and salt intake.  Her elevated blood pressure today occurred in the context of panic.  Important for her to address her panic and anxiety.  Have also discussed the role of isometric contraction.  She may benefit ultimately from medication such as Florinef or ProAmatine or nonpharmacological therapies like abdominal binders into her thigh sleeves.  I have encouraged her to have relatively soon follow-up with her PCP  Signed, Virl Axe, MD  03/23/2018, 11:31 AM

## 2018-03-23 NOTE — Progress Notes (Signed)
  Echocardiogram 2D Echocardiogram has been performed.  [name] [name] M 03/23/2018, 11:31 AM

## 2018-03-23 NOTE — Progress Notes (Signed)
Patient ambulated 10 feet after sitting up for 2.5hours and given 1L IVF.  C/o dizziness with ambulation, feels better but HR increased to 120-130.  PA Meng notified.

## 2018-03-23 NOTE — Progress Notes (Signed)
Patient sat on side of bed eating lunch for 30 minutes.  BP 138/96 HR 80.  Patient to ambulate with nurse.  On standing HR increased to 120-130 bpm. Patient states feels warm sensation to head and heaviness in her legs.  After 10 steps, states she feels she is going to pass out.  HR remains 120-130bpm, after 2 minutes patient stated feeling is not passing, walked several more steps and states feels she is going to passs out.  Returned to bed. BP 139/97, HR 87.  Will notify PA Meng.

## 2018-03-24 DIAGNOSIS — E7800 Pure hypercholesterolemia, unspecified: Secondary | ICD-10-CM

## 2018-03-24 DIAGNOSIS — I2510 Atherosclerotic heart disease of native coronary artery without angina pectoris: Secondary | ICD-10-CM

## 2018-03-24 DIAGNOSIS — R001 Bradycardia, unspecified: Principal | ICD-10-CM

## 2018-03-24 NOTE — Progress Notes (Signed)
Patient reports another episode of dizziness and a feeling of right ear popping. HR 60-70, BP 115/64.  Assisted to [facility], tolerated without problem but continues to c/o feeling dizzy.  Will notify PA [facility].

## 2018-03-24 NOTE — Progress Notes (Signed)
Patient up in chair for breakfast with abd binder on.  States started to feel pain in chest after eating 75% of breakfast, points to center of chest but states only hurts with deep inspiration.  Ambulated down hall with HR in 70s.  States dizziness but did not feel like passing out and improved from yesterday.  No increased HR changes with standing and walking.

## 2018-03-24 NOTE — Progress Notes (Signed)
Spoke with PA Rosita Fire, advised patient stable and to f/u with PCP when d/ced. Reviewed all d/c instructions with patient, copy given, patient verbalized understanding.  IV d/ced with problem.  All belongings with patient. Awaiting ride to d/c via [place].

## 2018-03-27 ENCOUNTER — Encounter: Payer: Self-pay | Admitting: Adult Health

## 2018-03-27 ENCOUNTER — Ambulatory Visit (INDEPENDENT_AMBULATORY_CARE_PROVIDER_SITE_OTHER): Payer: Managed Care, Other (non HMO) | Admitting: Adult Health

## 2018-03-27 VITALS — Temp 98.7°F | Ht 68.0 in | Wt 217.9 lb

## 2018-03-27 DIAGNOSIS — R42 Dizziness and giddiness: Secondary | ICD-10-CM | POA: Insufficient documentation

## 2018-03-27 DIAGNOSIS — R8290 Unspecified abnormal findings in urine: Secondary | ICD-10-CM | POA: Insufficient documentation

## 2018-03-27 DIAGNOSIS — Z820 Family history of epilepsy and other diseases of the nervous system: Secondary | ICD-10-CM

## 2018-03-27 DIAGNOSIS — R300 Dysuria: Secondary | ICD-10-CM | POA: Diagnosis not present

## 2018-03-27 DIAGNOSIS — Z0000 Encounter for general adult medical examination without abnormal findings: Secondary | ICD-10-CM

## 2018-03-27 LAB — POCT URINALYSIS DIPSTICK
Bilirubin, UA: NEGATIVE
GLUCOSE UA: NEGATIVE
Ketones, UA: NEGATIVE
Nitrite, UA: NEGATIVE
Protein, UA: NEGATIVE
SPEC GRAV UA: 1.015 (ref 1.010–1.025)
UROBILINOGEN UA: 0.2 U/dL
pH, UA: 7.5 (ref 5.0–8.0)

## 2018-03-27 MED ORDER — NITROFURANTOIN MONOHYD MACRO 100 MG PO CAPS: 100.0000 mg | ORAL_CAPSULE | Freq: Two times a day (BID) | ORAL | 0 refills | Status: DC

## 2018-03-27 NOTE — Assessment & Plan Note (Signed)
Continue to stay hydrated with water and gatorade. Please take Macrodantin as directed. We will call you when urine culture/sensitivty has resulted. Work Excuse provided, remain out until you are re-evaluated here in 1.5 weeks. Urgent referral to Neurology placed. Please call clinic with any questions/concerns.

## 2018-03-27 NOTE — Assessment & Plan Note (Addendum)
Dix Hallpike Neg Remain well hydrated Urgent Referral to Neurology placed If dizziness has not improved by f/u will consider imaging of head

## 2018-03-27 NOTE — Patient Instructions (Addendum)
Dizziness Dizziness is a common problem. It is a feeling of unsteadiness or light-headedness. You may feel like you are about to faint. Dizziness can lead to injury if you stumble or fall. Anyone can become dizzy, but dizziness is more common in older adults. This condition can be caused by a number of things, including medicines, dehydration, or illness. Follow these instructions at home: Eating and drinking  Drink enough fluid to keep your urine clear or pale yellow. This helps to keep you from becoming dehydrated. Try to drink more clear fluids, such as water.  Do not drink alcohol.  Limit your caffeine intake if told to do so by your health care provider. Check ingredients and nutrition facts to see if a food or beverage contains caffeine.  Limit your salt (sodium) intake if told to do so by your health care provider. Check ingredients and nutrition facts to see if a food or beverage contains sodium. Activity  Avoid making quick movements. ? Rise slowly from chairs and steady yourself until you feel okay. ? In the morning, first sit up on the side of the bed. When you feel okay, stand slowly while you hold onto something until you know that your balance is fine.  If you need to stand in one place for a long time, move your legs often. Tighten and relax the muscles in your legs while you are standing.  Do not drive or use heavy machinery if you feel dizzy.  Avoid bending down if you feel dizzy. Place items in your home so that they are easy for you to reach without leaning over. Lifestyle  Do not use any products that contain nicotine or tobacco, such as cigarettes and e-cigarettes. If you need help quitting, ask your health care provider.  Try to reduce your stress level by using methods such as yoga or meditation. Talk with your health care provider if you need help to manage your stress. General instructions  Watch your dizziness for any changes.  Take over-the-counter and  prescription medicines only as told by your health care provider. Talk with your health care provider if you think that your dizziness is caused by a medicine that you are taking.  Tell a friend or a family member that you are feeling dizzy. If he or she notices any changes in your behavior, have this person call your health care provider.  Keep all follow-up visits as told by your health care provider. This is important. Contact a health care provider if:  Your dizziness does not go away.  Your dizziness or light-headedness gets worse.  You feel nauseous.  You have reduced hearing.  You have new symptoms.  You are unsteady on your feet or you feel like the room is spinning. Get help right away if:  You vomit or have diarrhea and are unable to eat or drink anything.  You have problems talking, walking, swallowing, or using your arms, hands, or legs.  You feel generally weak.  You are not thinking clearly or you have trouble forming sentences. It may take a friend or family member to notice this.  You have chest pain, abdominal pain, shortness of breath, or sweating.  Your vision changes.  You have any bleeding.  You have a severe headache.  You have neck pain or a stiff neck.  You have a fever. These symptoms may represent a serious problem that is an emergency. Do not wait to see if the symptoms will go away. Get medical help   right away. Call your local emergency services (911 in the [place] Do not drive yourself to the hospital. Summary  Dizziness is a feeling of unsteadiness or light-headedness. This condition can be caused by a number of things, including medicines, dehydration, or illness.  Anyone can become dizzy, but dizziness is more common in older adults.  Drink enough fluid to keep your urine clear or pale yellow. Do not drink alcohol.  Avoid making quick movements if you feel dizzy. Monitor your dizziness for any changes. This information is not intended to  replace advice given to you by your health care provider. Make sure you discuss any questions you have with your health care provider. Document Released: 10/17/2000 Document Revised: 05/26/2016 Document Reviewed: 05/26/2016 Elsevier Interactive Patient Education  2018 [place] to stay hydrated with water and gatorade. Please take Macrodantin as directed. We will call you when urine culture/sensitivty has resulted. Work Excuse provided, remain out until you are re-evaluated here in 1.5 weeks. Urgent referral to Neurology placed. Please call clinic with any questions/concerns. FEEL BETTER!

## 2018-03-27 NOTE — Progress Notes (Signed)
Subjective:    [name], female    DOB: 1981/04/13, 37 y.o.   MRN: [id number]  HPI:  Ms. Kelso presents for hospital f/u. 03/22/18: "Pt was admitted to telemetry. Cardiac enzymes were cycled x 3 and remained negative. TSH was checked given bradycardia and was normal. Lipid panel showed elevated LDL at 139 mg/dL. Given her CP and bradycardia, she underwent coronary CTA to assess for CAD. She was premedicated w/ prednisone and benadryl given history of contrast allergy. Coronary CTA demonstrated coronary calcium score 46 which placed the patient at 55 percentile for age and sex matched control, normal coronary origin was right dominance, mild 0 to 25% disease in proximal to mid LAD, otherwise no significant coronary artery disease. 2D echo was also performed and demonstrated EF 55 to 60%, no regional wall motion normality.   Pt was last seen and examined by Dr. Caryl Comes on 03/23/2018 who determined she was stable for discharge home.  According to the Dr. Caryl Comes, patient had dizziness upon standing.  There was also significant increase in the heart rate with standing as well.  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  It is also important for her to address her panic and anxiety.  She may ultimately benefit from medication such as Florinef or ProAmatine or abdominal binders and compression stocking.  She is encouraged to follow-up with her PCP"  She was previously on citalopam '20mg'$  QD, d/c'd earlier this year due intolerance of med, re: "made me feel weird". She reports continued dizziness since d/c from hospital. Dizziness occurs at rest, with position change, she states "it's all the time". She reports low grade nausea without vomiting. She reports that her father was recently d/c'd with Parkinson's  She also states "I feel that my hands and arms want to draw up at times" She also  reports R Parietal HA and "feeling my heartbeat in my head": HA is constant, rated 3/10 and described as throbbing. She denies any recent migraine HAs She did not have any imaging of her head during recent hospitalization She also reports urinary frequency and dysuria. UA during hospitalization showed mod leuk's, she was never started on ABX   Lab Results  Component Value Date   HGBA1C 5.5 05/30/2017   HGBA1C 6.4 01/09/2017   HGBA1C 7.2 (H) 10/10/2016    Patient Care Team    Relationship Specialty Notifications Start End  Esaw Grandchild, NP PCP - General Family Medicine  10/18/16     Patient Active Problem List   Diagnosis Date Noted  . Abnormal urinalysis 03/27/2018  . Dysuria 03/27/2018  . Family history of Parkinsonism 03/27/2018  . Dizziness 03/27/2018  . Bradycardia 03/23/2018  . HLD (hyperlipidemia) 03/23/2018  . Coronary atherosclerosis 03/23/2018  . Chest pain 03/22/2018  . Varicose vein of leg 12/24/2017  . Venous reflux 12/24/2017  . Leukocytosis 09/29/2017  . Nausea 09/29/2017  . Anxiety 09/29/2017  . Pancreatitis 09/28/2017  . Dehydration 03/12/2017  . 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 Sleep apnea 01/09/2017  . Morbid obesity with BMI of 45.0-49.9, adult ([place]) 01/09/2017  . Healthcare maintenance 10/18/2016  . Type 2 diabetes mellitus without complications ([place]) 35/32/9924  . LOW BACK PAIN, ACUTE 11/20/2006  . POLYCYSTIC OVARY 07/04/2006  . OBESITY, NOS 07/04/2006  . TOBACCO USE, QUIT 07/04/2006     Past Medical History:  Diagnosis Date  . Abnormal  uterine bleeding   . Amenorrhea   . Anemia   . Anxiety    panic attacks  . Bradycardia   . Diabetes mellitus without complication ([place])    type 2 ---5/26 no loner in pre diabetic range since bariatric sx 6 months ago  . Dysmenorrhea   . GERD (gastroesophageal reflux disease)    occasional - diet controlled and tums prn  . Hyperlipidemia    no meds -diet controlled  .  Infertility, female   . Insulin resistance   . Migraine without aura   . Missed abortion    x 2 - both resolved without surgery  . Obesity   . PCOS (polycystic ovarian syndrome)   . PCOS (polycystic ovarian syndrome)   . PONV (postoperative nausea and vomiting)    pe rpatient ; scopalamine patch very helpful   . Sleep apnea   . SVD (spontaneous vaginal delivery)    x 1  . Urinary incontinence      Past Surgical History:  Procedure Laterality Date  . CHOLECYSTECTOMY    . COLONOSCOPY  2017   polyps  . CYSTOSCOPY N/A 10/29/2016   Procedure: CYSTOSCOPY;  Surgeon: Salvadore Dom, MD;  Location: [facility];  Service: Gynecology;  Laterality: N/A;  . DILATATION & CURRETTAGE/HYSTEROSCOPY WITH RESECTOCOPE N/A 10/17/2012   Procedure: DILATATION & CURETTAGE/HYSTEROSCOPY WITH RESECTOCOPE; Possible Polypectomy, Possible Resectoscopic Myomectomy.;  Surgeon: Floyce Stakes. Pamala Hurry, MD;  Location: [facility];  Service: Gynecology;  Laterality: N/A;  1 hr.  Marland Kitchen DILATION AND CURETTAGE OF UTERUS    . ENDOMETRIAL BIOPSY    . ESOPHAGOGASTRODUODENOSCOPY ENDOSCOPY  2017   Normal  . HYSTEROSCOPY W/D&C N/A 12/01/2013   Procedure: IUD Removal;  Surgeon: Claiborne Billings A. Pamala Hurry, MD;  Location: [facility];  Service: Gynecology;  Laterality: N/A;  90 min.  . INTRAUTERINE DEVICE (IUD) INSERTION N/A 10/17/2012   Procedure: INTRAUTERINE DEVICE (IUD) INSERTION; Mirena;  Surgeon: Floyce Stakes. Pamala Hurry, MD;  Location: [facility];  Service: Gynecology;  Laterality: N/A;  . LAPAROSCOPIC GASTRIC SLEEVE RESECTION N/A 02/19/2017   Procedure: LAPAROSCOPIC GASTRIC SLEEVE RESECTION, UPPER ENDOSCOPY;  Surgeon: Johnathan Hausen, MD;  Location: [facility];  Service: General;  Laterality: N/A;  . LAPAROSCOPIC SALPINGO OOPHERECTOMY Left 10/29/2016   Procedure: LAPAROSCOPIC SALPINGO OOPHORECTOMY With anterior uterine biopsy;  Surgeon: Salvadore Dom, MD;  Location: [facility];  Service: Gynecology;  Laterality: Left;  . MOUTH SURGERY     wisdom teeth      Family History  Problem Relation Age of Onset  . Diabetes Father   . Thyroid disease Father   . Hypertension Father   . Diabetes Maternal Grandmother   . Heart disease Maternal Grandmother   . Diabetes Maternal Grandfather   . Heart disease Maternal Grandfather   . Stroke Maternal Grandfather   . Diabetes Paternal Grandmother   . Diabetes Paternal Grandfather   . Heart disease Paternal Grandfather   . Alcohol abuse Paternal Grandfather   . Diabetes Other   . Obesity Other   . Sleep apnea Other   . Hyperlipidemia Other   . Hypertension Other   . Cancer Other   . Heart disease Other   . Hypertension Mother      Social History   Substance and Sexual Activity  Drug Use No     Social History   Substance and Sexual Activity  Alcohol Use No  . Frequency: Never     Social History   Tobacco Use  Smoking Status Former Smoker  . Packs/day:  0.25  . Years: 19.00  . Pack years: 4.75  . Types: Cigarettes  . Last attempt to quit: 06/06/2016  . Years since quitting: 1.8  Smokeless Tobacco Never Used     Outpatient Encounter Medications as of 03/27/2018  Medication Sig  . aspirin EC 81 MG EC tablet Take 1 tablet (81 mg total) by mouth daily.  . blood glucose meter kit and supplies KIT Dispense based on patient and insurance preference. Use up to four times daily as directed. (FOR ICD-9 250.00, 250.01).  . Calcium-Vitamin D-Vitamin K (CALCIUM SOFT CHEWS PO) Take by mouth daily.  . cyclobenzaprine (FLEXERIL) 10 MG tablet Take 1 tablet (10 mg total) by mouth 3 (three) times daily as needed for muscle spasms.  . diazepam (VALIUM) 5 MG tablet Take 1 tablet (5 mg total) by mouth every 8 (eight) hours as needed for anxiety.  Marland Kitchen ibuprofen (ADVIL,MOTRIN) 200 MG tablet Take 200 mg by mouth every 6 (six) hours as needed for headache or moderate pain.  . IRON PO Take 45 mg by mouth every other day.  . levonorgestrel (MIRENA) 20 MCG/24HR IUD 1 each by Intrauterine route once.   . loratadine (CLARITIN) 10 MG tablet Take 10 mg by mouth daily as needed for allergies.  . Multiple Vitamins-Iron (MULTIVITAMINS WITH IRON) TABS tablet Take 1 tablet by mouth 2 (two) times daily. CHEWABLE  . nystatin cream (MYCOSTATIN) Apply 1 application topically 2 (two) times daily. Apply to affected area BID for up to 7 days. (Patient taking differently: Apply 1 application topically 2 (two) times daily as needed for dry skin (rash). A)  . ondansetron (ZOFRAN) 4 MG tablet Take 1 tablet (4 mg total) by mouth every 8 (eight) hours as needed for nausea or vomiting.  Marland Kitchen OVER THE COUNTER MEDICATION Take 500 mg by mouth 3 (three) times daily. Bariatric calcium '500mg'$    . pantoprazole (PROTONIX) 40 MG tablet Take 40 mg by mouth daily as needed (reflux).   . nitrofurantoin, macrocrystal-monohydrate, (MACROBID) 100 MG capsule Take 1 capsule (100 mg total) by mouth 2 (two) times daily.  . [DISCONTINUED] Multiple Vitamins-Minerals (HAIR SKIN AND NAILS FORMULA PO) Take 1 tablet by mouth daily.   No facility-administered encounter medications on file as of 03/27/2018.     Allergies: Iohexol  Body mass index is 33.13 kg/m.  Temperature 98.7 F (37.1 C), temperature source Oral, height '5\' 8"'$  (1.727 m), weight 217 lb 14.4 oz (98.8 kg), SpO2 99 %.  Review of Systems  Constitutional: Positive for activity change, appetite change and fatigue. Negative for chills, diaphoresis, fever and unexpected weight change.  Eyes: Negative for visual disturbance.  Respiratory: Negative for cough, chest tightness, shortness of breath, wheezing and stridor.   Cardiovascular: Negative for chest pain, palpitations and leg swelling.  Gastrointestinal: Negative for abdominal distention, anal bleeding, blood in stool, constipation, diarrhea, nausea and vomiting.  Endocrine: Negative for cold intolerance, heat intolerance, polydipsia, polyphagia and polyuria.  Genitourinary: Positive for frequency and urgency.  Skin:  Negative for color change, pallor, rash and wound.  Neurological: Positive for dizziness and headaches.  Hematological: Does not bruise/bleed easily.  Psychiatric/Behavioral: Positive for sleep disturbance. The patient is nervous/anxious.        Objective:   Physical Exam  Constitutional: She is oriented to person, place, and time. She appears well-developed and well-nourished. No distress.  HENT:  Head: Normocephalic and atraumatic.  Right Ear: External ear normal.  Left Ear: External ear normal.  Nose: Nose normal.  Mouth/Throat: Oropharynx is  clear and moist.  Eyes: Pupils are equal, round, and reactive to light. Conjunctivae and EOM are normal.  Cardiovascular: Normal rate, regular rhythm, normal heart sounds and intact distal pulses.  No murmur heard. Pulmonary/Chest: Effort normal and breath sounds normal. No stridor. No respiratory distress. She has no wheezes. She has no rales. She exhibits no tenderness.  Neurological: She is alert and oriented to person, place, and time. She displays no tremor and normal reflexes. She displays a negative Romberg sign.  Dix Hallpike- Neg Very guarded when changing position or ambulating, however steady Able to ambulate without assistive devices   Skin: Skin is warm and dry. Capillary refill takes less than 2 seconds. No rash noted. She is not diaphoretic. No erythema. No pallor.  Psychiatric: She has a normal mood and affect. Her behavior is normal. Judgment and thought content normal.  Nursing note and vitals reviewed.     Assessment & Plan:   1. Dizziness   2. Family history of Parkinsonism   3. Dysuria   4. Abnormal urinalysis   5. Healthcare maintenance     Healthcare maintenance Continue to stay hydrated with water and gatorade. Please take Macrodantin as directed. We will call you when urine culture/sensitivty has resulted. Work Excuse provided, remain out until you are re-evaluated here in 1.5 weeks. Urgent referral to  Neurology placed. Please call clinic with any questions/concerns.  Dysuria Continue to stay hydrated with water and gatorade. Please take Macrodantin as directed. We will call you when urine culture/sensitivty has resulted. Work Excuse provided, remain out until you are re-evaluated here in 1.5 weeks. Urgent referral to Neurology placed. Please call clinic with any questions/concerns.  Dizziness Dix Hallpike Neg Remain well hydrated Urgent Referral to Neurology placed If dizziness has not improved by f/u will consider imaging of head   Pt was in the office today for 45+ minutes, I spent >75% of time in face to face counseling of various medical concerns and in coordination of care  FOLLOW-UP:  Return in about 2 weeks (around 04/10/2018) for Regular Follow Up.

## 2018-03-31 ENCOUNTER — Other Ambulatory Visit: Payer: Self-pay | Admitting: Adult Health

## 2018-03-31 LAB — CULTURE, URINE COMPREHENSIVE

## 2018-04-01 ENCOUNTER — Emergency Department (HOSPITAL_BASED_OUTPATIENT_CLINIC_OR_DEPARTMENT_OTHER): Payer: Managed Care, Other (non HMO)

## 2018-04-01 ENCOUNTER — Emergency Department (HOSPITAL_BASED_OUTPATIENT_CLINIC_OR_DEPARTMENT_OTHER)
Admission: EM | Admit: 2018-04-01 | Discharge: 2018-04-01 | Disposition: A | Discharge status: Home / Self Care | Payer: Managed Care, Other (non HMO) | Attending: Emergency Medicine | Admitting: Emergency Medicine

## 2018-04-01 ENCOUNTER — Other Ambulatory Visit: Payer: Self-pay

## 2018-04-01 ENCOUNTER — Other Ambulatory Visit: Payer: Self-pay | Admitting: *Deleted

## 2018-04-01 ENCOUNTER — Encounter (HOSPITAL_BASED_OUTPATIENT_CLINIC_OR_DEPARTMENT_OTHER): Payer: Self-pay | Admitting: Emergency Medicine

## 2018-04-01 DIAGNOSIS — E119 Type 2 diabetes mellitus without complications: Secondary | ICD-10-CM | POA: Insufficient documentation

## 2018-04-01 DIAGNOSIS — W010XXA Fall on same level from slipping, tripping and stumbling without subsequent striking against object, initial encounter: Secondary | ICD-10-CM | POA: Insufficient documentation

## 2018-04-01 DIAGNOSIS — S6991XA Unspecified injury of right wrist, hand and finger(s), initial encounter: Secondary | ICD-10-CM | POA: Diagnosis present

## 2018-04-01 DIAGNOSIS — I83893 Varicose veins of bilateral lower extremities with other complications: Secondary | ICD-10-CM

## 2018-04-01 DIAGNOSIS — Z79899 Other long term (current) drug therapy: Secondary | ICD-10-CM | POA: Insufficient documentation

## 2018-04-01 DIAGNOSIS — Y9384 Activity, sleeping: Secondary | ICD-10-CM | POA: Insufficient documentation

## 2018-04-01 DIAGNOSIS — Y92013 Bedroom of single-family (private) house as the place of occurrence of the external cause: Secondary | ICD-10-CM | POA: Insufficient documentation

## 2018-04-01 DIAGNOSIS — Z87891 Personal history of nicotine dependence: Secondary | ICD-10-CM | POA: Insufficient documentation

## 2018-04-01 DIAGNOSIS — S63501A Unspecified sprain of right wrist, initial encounter: Secondary | ICD-10-CM | POA: Diagnosis not present

## 2018-04-01 DIAGNOSIS — I8393 Asymptomatic varicose veins of bilateral lower extremities: Secondary | ICD-10-CM

## 2018-04-01 DIAGNOSIS — Y999 Unspecified external cause status: Secondary | ICD-10-CM | POA: Insufficient documentation

## 2018-04-01 DIAGNOSIS — Z7982 Long term (current) use of aspirin: Secondary | ICD-10-CM | POA: Insufficient documentation

## 2018-04-01 DIAGNOSIS — M25531 Pain in right wrist: Secondary | ICD-10-CM

## 2018-04-01 MED ORDER — ACETAMINOPHEN 500 MG PO TABS
1000.0000 mg | ORAL_TABLET | Freq: Once | ORAL | Status: AC
  Administered 2018-04-01: 1000 mg via ORAL
  Filled 2018-04-01: qty 2

## 2018-04-01 MED FILL — LORazepam 1 MG TABS: 1 | 1 days supply | Qty: 2 | Fill #0

## 2018-04-01 NOTE — ED Notes (Signed)
Patient verbalizes understanding of discharge instructions. Opportunity for questioning and answers were provided. Armband removed by staff, pt discharged from ED home via POV.  

## 2018-04-01 NOTE — ED Triage Notes (Signed)
Pt c/o right wrist pain after sustaining a mechanical fall last night.

## 2018-04-01 NOTE — ED Notes (Signed)
Pt reports getting up to use the bathroom and reports tripping over her sneakers. Pt reports right wrist pain with swelling. Pt is able to move her wrist and fingers but same hurts.

## 2018-04-01 NOTE — ED Provider Notes (Signed)
[facility] EMERGENCY DEPARTMENT Provider Note  CSN: [id number] Arrival date & time: 04/01/18 0458  Chief Complaint(s) Arm Injury  HPI [name] [name] is a 37 y.o. female   The history is provided by the patient.  Arm Injury   This is a new problem. The current episode started 3 to 5 hours ago. The problem occurs constantly. The problem has been gradually worsening. The pain is present in the right wrist. The quality of the pain is described as aching, pounding and constant. The pain is moderate. Associated symptoms include limited range of motion and tingling (to right index and small fingers). The symptoms are aggravated by activity and contact. She has tried OTC pain medications for the symptoms. The treatment provided no relief. There has been a history of trauma (tripped on her slippers while getting up to go to bathroom around midnight).    Past Medical History Past Medical History:  Diagnosis Date  . Abnormal uterine bleeding   . Amenorrhea   . Anemia   . Anxiety    panic attacks  . Bradycardia   . Diabetes mellitus without complication ([place])    type 2 ---5/26 no loner in pre diabetic range since bariatric sx 6 months ago  . Dysmenorrhea   . GERD (gastroesophageal reflux disease)    occasional - diet controlled and tums prn  . Hyperlipidemia    no meds -diet controlled  . Infertility, female   . Insulin resistance   . Migraine without aura   . Missed abortion    x 2 - both resolved without surgery  . Obesity   . PCOS (polycystic ovarian syndrome)   . PCOS (polycystic ovarian syndrome)   . PONV (postoperative nausea and vomiting)    pe rpatient ; scopalamine patch very helpful   . Sleep apnea   . SVD (spontaneous vaginal delivery)    x 1  . Urinary incontinence    Patient Active Problem List   Diagnosis Date Noted  . Abnormal urinalysis 03/27/2018  . Dysuria 03/27/2018  . Family history of Parkinsonism 03/27/2018  . Dizziness 03/27/2018  .  Bradycardia 03/23/2018  . HLD (hyperlipidemia) 03/23/2018  . Coronary atherosclerosis 03/23/2018  . Chest pain 03/22/2018  . Varicose vein of leg 12/24/2017  . Venous reflux 12/24/2017  . Leukocytosis 09/29/2017  . Nausea 09/29/2017  . Anxiety 09/29/2017  . Pancreatitis 09/28/2017  . Dehydration 03/12/2017  . 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 Sleep apnea 01/09/2017  . Morbid obesity with BMI of 45.0-49.9, adult ([place]) 01/09/2017  . Healthcare maintenance 10/18/2016  . Type 2 diabetes mellitus without complications ([place]) [id number]  . LOW BACK PAIN, ACUTE 11/20/2006  . POLYCYSTIC OVARY 07/04/2006  . OBESITY, NOS 07/04/2006  . TOBACCO USE, QUIT 07/04/2006   Home Medication(s) Prior to Admission medications   Medication Sig Start Date End Date Taking? Authorizing Provider  aspirin EC 81 MG EC tablet Take 1 tablet (81 mg total) by mouth daily. 03/24/18   Almyra Deforest, PA  blood glucose meter kit and supplies KIT Dispense based on patient and insurance preference. Use up to four times daily as directed. (FOR ICD-9 250.00, 250.01). 10/18/16   Danford, Valetta Fuller D, NP  Calcium-Vitamin D-Vitamin K (CALCIUM SOFT CHEWS PO) Take by mouth daily.    [provider]  cyclobenzaprine (FLEXERIL) 10 MG tablet Take 1 tablet (10 mg total) by mouth 3 (three) times daily as needed for muscle spasms. 07/30/17   Esaw Grandchild, NP  diazepam (VALIUM) 5 MG tablet Take 1 tablet (5 mg total) by mouth every 8 (eight) hours as needed for anxiety. 10/18/16   Danford, Valetta Fuller D, NP  ibuprofen (ADVIL,MOTRIN) 200 MG tablet Take 200 mg by mouth every 6 (six) hours as needed for headache or moderate pain.    [provider]  IRON PO Take 45 mg by mouth every other day.    [provider]  levonorgestrel (MIRENA) 20 MCG/24HR IUD 1 each by Intrauterine route once.    [provider]  loratadine (CLARITIN) 10 MG tablet Take 10 mg by mouth daily as needed for allergies.     [provider]  Multiple Vitamins-Iron (MULTIVITAMINS WITH IRON) TABS tablet Take 1 tablet by mouth 2 (two) times daily. CHEWABLE    [provider]  nystatin cream (MYCOSTATIN) Apply 1 application topically 2 (two) times daily. Apply to affected area BID for up to 7 days. Patient taking differently: Apply 1 application topically 2 (two) times daily as needed for dry skin (rash). A 05/30/17   Salvadore Dom, MD  ondansetron (ZOFRAN) 4 MG tablet Take 1 tablet (4 mg total) by mouth every 8 (eight) hours as needed for nausea or vomiting. 10/02/17   Cristal Ford, DO  OVER THE COUNTER MEDICATION Take 500 mg by mouth 3 (three) times daily. Bariatric calcium 560m     [provider]  pantoprazole (PROTONIX) 40 MG tablet Take 40 mg by mouth daily as needed (reflux).  02/07/17   [provider]                                                                                                                                    Past Surgical History Past Surgical History:  Procedure Laterality Date  . CHOLECYSTECTOMY    . COLONOSCOPY  2017   polyps  . CYSTOSCOPY N/A 10/29/2016   Procedure: CYSTOSCOPY;  Surgeon: JSalvadore Dom MD;  [facility];  Service: Gynecology;  Laterality: N/A;  . DILATATION & CURRETTAGE/HYSTEROSCOPY WITH RESECTOCOPE N/A 10/17/2012   Procedure: DILATATION & CURETTAGE/HYSTEROSCOPY WITH RESECTOCOPE; Possible Polypectomy, Possible Resectoscopic Myomectomy.;  Surgeon: KFloyce Stakes FPamala Hurry MD;  [facility];  Service: Gynecology;  Laterality: N/A;  1 hr.  .Marland KitchenDILATION AND CURETTAGE OF UTERUS    . ENDOMETRIAL BIOPSY    . ESOPHAGOGASTRODUODENOSCOPY ENDOSCOPY  2017   Normal  . HYSTEROSCOPY W/D&C N/A 12/01/2013   Procedure: IUD Removal;  Surgeon: KClaiborne BillingsA. FPamala Hurry MD;  [facility];  Service: Gynecology;  Laterality: N/A;  90 min.  . INTRAUTERINE DEVICE (IUD) INSERTION N/A 10/17/2012   Procedure: INTRAUTERINE DEVICE (IUD) INSERTION;  Mirena;  Surgeon: KFloyce Stakes FPamala Hurry MD;  [facility];  Service: Gynecology;  Laterality: N/A;  . LAPAROSCOPIC GASTRIC SLEEVE RESECTION N/A 02/19/2017   Procedure: LAPAROSCOPIC GASTRIC SLEEVE RESECTION, UPPER ENDOSCOPY;  Surgeon: MJohnathan Hausen MD;  [facility];  Service: General;  Laterality: N/A;  .  LAPAROSCOPIC SALPINGO OOPHERECTOMY Left 10/29/2016   Procedure: LAPAROSCOPIC SALPINGO OOPHORECTOMY With anterior uterine biopsy;  Surgeon: Salvadore Dom, MD;  Location: [facility];  Service: Gynecology;  Laterality: Left;  . MOUTH SURGERY     wisdom teeth   Family History Family History  Problem Relation Age of Onset  . Diabetes Father   . Thyroid disease Father   . Hypertension Father   . Diabetes Maternal Grandmother   . Heart disease Maternal Grandmother   . Diabetes Maternal Grandfather   . Heart disease Maternal Grandfather   . Stroke Maternal Grandfather   . Diabetes Paternal Grandmother   . Diabetes Paternal Grandfather   . Heart disease Paternal Grandfather   . Alcohol abuse Paternal Grandfather   . Diabetes Other   . Obesity Other   . Sleep apnea Other   . Hyperlipidemia Other   . Hypertension Other   . Cancer Other   . Heart disease Other   . Hypertension Mother     Social History Social History   Tobacco Use  . Smoking status: Former Smoker    Packs/day: 0.25    Years: 19.00    Pack years: 4.75    Types: Cigarettes    Last attempt to quit: 06/06/2016    Years since quitting: 1.8  . Smokeless tobacco: Never Used  Substance Use Topics  . Alcohol use: No    Frequency: Never  . Drug use: No   Allergies Iohexol  Review of Systems Review of Systems  Neurological: Positive for tingling (to right index and small fingers).   All other systems are reviewed and are negative for acute change except as noted in the HPI  Physical Exam Vital Signs  I have reviewed the triage vital signs BP 117/72 (BP Location: Left Arm)   Pulse (!) 59   Temp 98.6  F (37 C)   Resp 18   Ht _0  (1.727 m)   Wt 98.8 kg   SpO2 100%   BMI 33.12 kg/m   Physical Exam  Constitutional: She is oriented to person, place, and time. She appears well-developed and well-nourished. No distress.  HENT:  Head: Normocephalic and atraumatic.  Right Ear: External ear normal.  Left Ear: External ear normal.  Nose: Nose normal.  Eyes: Conjunctivae and EOM are normal. No scleral icterus.  Neck: Normal range of motion and phonation normal.  Cardiovascular: Normal rate and regular rhythm.  Pulses:      Radial pulses are 2+ on the right side, and 2+ on the left side.  Pulmonary/Chest: Effort normal. No stridor. No respiratory distress.  Abdominal: She exhibits no distension.  Musculoskeletal: She exhibits no edema.       Right wrist: She exhibits decreased range of motion, bony tenderness, swelling and deformity (tender lump over distal radius).  Snuffbox tenderness  Neurological: She is alert and oriented to person, place, and time. No sensory deficit.  Skin: She is not diaphoretic.  Psychiatric: She has a normal mood and affect. Her behavior is normal.  Vitals reviewed.   ED Results and Treatments Labs (all labs ordered are listed, but only abnormal results are displayed) Labs Reviewed - No data to display  EKG  EKG Interpretation  Date/Time:    Ventricular Rate:    PR Interval:    QRS Duration:   QT Interval:    QTC Calculation:   R Axis:     Text Interpretation:        Radiology Dg Wrist Complete Right  Result Date: 04/01/2018 CLINICAL DATA:  Right wrist pain after fall yesterday. EXAM: RIGHT WRIST - COMPLETE 3+ VIEW COMPARISON:  None. FINDINGS: There is no evidence of fracture or dislocation. The scaphoid is intact. There is no evidence of arthropathy or other focal bone abnormality. Soft tissues are unremarkable. IMPRESSION:  Negative radiographs of the right wrist. Electronically Signed   By: Keith Rake M.D.   On: 04/01/2018 06:02   Pertinent labs & imaging results that were available during my care of the patient were reviewed by me and considered in my medical decision making (see chart for details).  Medications Ordered in ED Medications  acetaminophen (TYLENOL) tablet 1,000 mg (1,000 mg Oral Given 04/01/18 0547)                                                                                                                                    Procedures Procedures  (including critical care time)  Medical Decision Making / ED Course I have reviewed the nursing notes for this encounter and the patient's prior records (if available in EHR or on provided paperwork).      She declined pain meds other than tylenol.  Plain w/o acute fracture or dislocation. Wrist sprain +/- occult scaphoid fracture (given snuffbox tenderness). Thumb spica provided.   Has been seen by [facility] in the past. F/u for repeat plain film to assess for occult fracture.  The patient appears reasonably screened and/or stabilized for discharge and I doubt any other medical condition or other [facility] requiring further screening, evaluation, or treatment in the ED at this time prior to discharge.  The patient is safe for discharge with strict return precautions.  Final Clinical Impression(s) / ED Diagnoses Final diagnoses:  Right wrist pain  Sprain of right wrist, initial encounter   Disposition: Discharge  Condition: Good  I have discussed the results, Dx and Tx plan with the patient who expressed understanding and agree(s) with the plan. Discharge instructions discussed at great length. The patient was given strict return precautions who verbalized understanding of the instructions. No further questions at time of discharge.    ED Discharge Orders    None       Follow Up: [place] [id number] [phone] Call  For close follow up to assess for possible scaphoid fracture      This chart was dictated using voice recognition software.  Despite best efforts to proofread,  errors can occur which can change the documentation meaning.   Fatima Blank, MD 04/01/18 [id number]

## 2018-04-07 NOTE — Progress Notes (Signed)
 Subjective:    [name], female    DOB: 07/15/1980, 37 y.o.   MRN: [id number]  HPI: 03/27/18 OV:  Ms. Gitto presents for hospital f/u. 03/22/18: "Pt was admitted to telemetry. Cardiac enzymes were cycled x 3 and remained negative. TSH was checked given bradycardia and was normal. Lipid panel showed elevated LDL at 139 mg/dL. Given her CP and bradycardia, she underwent coronary CTA to assess for CAD. She was premedicated w/ prednisone and benadryl given history of contrast allergy. Coronary CTA demonstrated coronary calcium score 46 which placed the patient at 99 percentile for age and sex matched control, normal coronary origin was right dominance, mild 0 to 25% disease in proximal to mid LAD, otherwise no significant coronary artery disease. 2D echo was also performed and demonstrated EF 55 to 60%, no regional wall motion normality.   Pt was last seen and examined by Dr. Klein on 03/23/2018 who determined she was stable for discharge home.  According to the Dr. Klein, patient had dizziness upon standing.  There was also significant increase in the heart rate with standing as well.  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  It is also important for her to address her panic and anxiety.  She may ultimately benefit from medication such as Florinef or ProAmatine or abdominal binders and compression stocking.  She is encouraged to follow-up with her PCP"  She was previously on citalopam 20mg QD, d/c'd earlier this year due intolerance of med, re: "made me feel weird". She reports continued dizziness since d/c from hospital. Dizziness occurs at rest, with position change, she states "it's all the time". She reports low grade nausea without vomiting. She reports that her father was recently d/c'd with Parkinson's  She also states "I feel that my hands and arms want to draw up at  times" She also reports R Parietal HA and "feeling my heartbeat in my head": HA is constant, rated 3/10 and described as throbbing. She denies any recent migraine HAs She did not have any imaging of her head during recent hospitalization She also reports urinary frequency and dysuria. UA during hospitalization showed mod leuk's, she was never started on ABX  04/10/18 OV: Ms. Beets is here for f/u:dizziness She was seen by Neurology 04/07/18, OV: 1) Unexplained bradycardia with venous insufficiency - incomplete RBBB.   A repeat orthostatic blood pressure : Supine-121/79 mmHg heart rate was 49 regular beats per minute, seated -118/83, 52 bpm.Standing heart rate went up to 61 blood pressure remained at 121/82.    Dizziness noted after standing for about 30 seconds, she did not notice dizziness while in motion, she can walk without a wide-based and she can turn with 3 steps there is no vertigo noted.  This finding seem to be related to a low heart rate response.  Dysautonomia? Postural bradycardia syndrome ?  2) Migraines seem to affect her- one sided head pain and numbness -always on the right, and she noted these since she has had near faiting and finally syncopal episodes.  PLan:  1) Hydration, use the compression stockings.  2) CPAP 3) migraine prevention - Excedrine prn OTC.  Follow-up with Neuro in 2/3 months  04/01/18- ED visit for R wrist pain r/t fall- she will f/u with Orthopedic Specialist   Today she reports slight reduction in dizziness. She will have varicose vein procedure next week She will have month   long Holter Study, estimated to begin at end of Dec She has been increasing water intake and walking daily  She reports brief, palpitations during periods of acute anxiety, denies CP with exertion. 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 Dysuria 03/27/2018  . Family history of Parkinsonism 03/27/2018  . Dizziness 03/27/2018  . Bradycardia 03/23/2018  . HLD (hyperlipidemia) 03/23/2018  . Coronary atherosclerosis 03/23/2018  . Chest pain 03/22/2018  . Varicose vein of leg 12/24/2017  . Venous reflux 12/24/2017  . Leukocytosis 09/29/2017  . Nausea 09/29/2017  . Anxiety 09/29/2017  . Pancreatitis 09/28/2017  . Dehydration 03/12/2017  . 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 Sleep apnea 01/09/2017  . Healthcare maintenance 10/18/2016  . LOW BACK PAIN, ACUTE 11/20/2006  . POLYCYSTIC OVARY 07/04/2006  . OBESITY, NOS 07/04/2006  . TOBACCO USE, QUIT 07/04/2006     Past Medical History:  Diagnosis Date  . Abnormal uterine bleeding   . Amenorrhea   . Anemia   . Anxiety    panic attacks  . Bradycardia   . Diabetes mellitus without complication ([place])    type 2 ---5/26 no loner in pre diabetic range since bariatric sx 6 months ago  . Dysmenorrhea   . GERD (gastroesophageal reflux disease)    occasional - diet controlled and tums prn  . Hyperlipidemia    no meds -diet controlled  . Infertility, female   . Insulin resistance   . Migraine without aura   . Missed abortion    x 2 - both resolved without surgery  . Obesity   . PCOS (polycystic ovarian syndrome)   . PCOS (polycystic ovarian syndrome)   . PONV (postoperative nausea and vomiting)    pe rpatient ; scopalamine patch very helpful   . Sleep apnea   . SVD (spontaneous vaginal delivery)    x 1  . Urinary incontinence      Past Surgical History:  Procedure Laterality Date  . CHOLECYSTECTOMY    . COLONOSCOPY  2017   polyps  . CYSTOSCOPY N/A 10/29/2016   Procedure: CYSTOSCOPY;   Surgeon: Salvadore Dom, MD;  Location: [facility];  Service: Gynecology;  Laterality: N/A;  . DILATATION & CURRETTAGE/HYSTEROSCOPY WITH RESECTOCOPE N/A 10/17/2012   Procedure: DILATATION & CURETTAGE/HYSTEROSCOPY WITH RESECTOCOPE; Possible Polypectomy, Possible Resectoscopic Myomectomy.;  Surgeon: Floyce Stakes. Pamala Hurry, MD;  Location: [facility];  Service: Gynecology;  Laterality: N/A;  1 hr.  Marland Kitchen DILATION AND CURETTAGE OF UTERUS    . ENDOMETRIAL BIOPSY    . ESOPHAGOGASTRODUODENOSCOPY ENDOSCOPY  2017   Normal  . HYSTEROSCOPY W/D&C N/A 12/01/2013   Procedure: IUD Removal;  Surgeon: Claiborne Billings A. Pamala Hurry, MD;  Location: [facility];  Service: Gynecology;  Laterality: N/A;  90 min.  . INTRAUTERINE DEVICE (IUD) INSERTION N/A 10/17/2012   Procedure: INTRAUTERINE DEVICE (IUD) INSERTION; Mirena;  Surgeon: Floyce Stakes. Pamala Hurry, MD;  Location: [facility];  Service: Gynecology;  Laterality: N/A;  . LAPAROSCOPIC GASTRIC SLEEVE RESECTION N/A 02/19/2017   Procedure: LAPAROSCOPIC GASTRIC SLEEVE RESECTION, UPPER ENDOSCOPY;  Surgeon: Johnathan Hausen, MD;  Location: [facility];  Service: General;  Laterality: N/A;  . LAPAROSCOPIC SALPINGO OOPHERECTOMY Left 10/29/2016   Procedure: LAPAROSCOPIC SALPINGO OOPHORECTOMY With anterior uterine biopsy;  Surgeon: Jertson, Jill Evelyn, MD;  Location: [facility];  Service: Gynecology;  Laterality: Left;  . MOUTH SURGERY     wisdom teeth     Family History  Problem Relation Age of Onset  . Diabetes Father   . Thyroid disease Father   . Hypertension Father   . Diabetes Maternal Grandmother   . Heart disease Maternal Grandmother   . Diabetes Maternal Grandfather   . Heart disease Maternal Grandfather   . Stroke Maternal Grandfather   . Diabetes Paternal Grandmother   . Diabetes Paternal Grandfather   . Heart disease Paternal Grandfather   . Alcohol abuse Paternal Grandfather   . Diabetes Other   . Obesity Other   . Sleep apnea Other   . Hyperlipidemia Other   . Hypertension Other   . Cancer  Other   . Heart disease Other   . Hypertension Mother      Social History   Substance and Sexual Activity  Drug Use No     Social History   Substance and Sexual Activity  Alcohol Use No  . Frequency: Never     Social History   Tobacco Use  Smoking Status Former Smoker  . Packs/day: 0.25  . Years: 19.00  . Pack years: 4.75  . Types: Cigarettes  . Last attempt to quit: 06/06/2016  . Years since quitting: 1.8  Smokeless Tobacco Never Used     Outpatient Encounter Medications as of 04/10/2018  Medication Sig  . aspirin EC 81 MG EC tablet Take 1 tablet (81 mg total) by mouth daily.  . blood glucose meter kit and supplies KIT Dispense based on patient and insurance preference. Use up to four times daily as directed. (FOR ICD-9 250.00, 250.01).  . Calcium-Vitamin D-Vitamin K (CALCIUM SOFT CHEWS PO) Take by mouth daily.  . cyclobenzaprine (FLEXERIL) 10 MG tablet Take 1 tablet (10 mg total) by mouth 3 (three) times daily as needed for muscle spasms.  . diazepam (VALIUM) 5 MG tablet Take 1 tablet (5 mg total) by mouth every 8 (eight) hours as needed for anxiety.  . ibuprofen (ADVIL,MOTRIN) 200 MG tablet Take 200 mg by mouth every 6 (six) hours as needed for headache or moderate pain.  . IRON PO Take 45 mg by mouth every other day.  . levonorgestrel (MIRENA) 20 MCG/24HR IUD 1 each by Intrauterine route once.  . loratadine (CLARITIN) 10 MG tablet Take 10 mg by mouth daily as needed for allergies.  . Multiple Vitamins-Iron (MULTIVITAMINS WITH IRON) TABS tablet Take 1 tablet by mouth 2 (two) times daily. CHEWABLE  . nystatin cream (MYCOSTATIN) Apply 1 application topically 2 (two) times daily. Apply to affected area BID for up to 7 days. (Patient taking differently: Apply 1 application topically 2 (two) times daily as needed for dry skin (rash). A)  . ondansetron (ZOFRAN) 4 MG tablet Take 1 tablet (4 mg total) by mouth every 8 (eight) hours as needed for nausea or vomiting.  . OVER  THE COUNTER MEDICATION Take 500 mg by mouth 3 (three) times daily. Bariatric calcium 500mg   . pantoprazole (PROTONIX) 40 MG tablet Take 40 mg by mouth daily as needed (reflux).    No facility-administered encounter medications on file as of 04/10/2018.     Allergies: Iohexol  Body mass index is 34.38 kg/m.  Blood pressure 126/84, pulse 73, temperature 98.9   F (37.2 C), temperature source Oral, height 5' 8" (1.727 m), weight 226 lb 1.6 oz (102.6 kg), SpO2 97 %.  Review of Systems  Constitutional: Positive for activity change, appetite change and fatigue. Negative for chills, diaphoresis, fever and unexpected weight change.  Eyes: Negative for visual disturbance.  Respiratory: Negative for cough, chest tightness, shortness of breath, wheezing and stridor.   Cardiovascular: Negative for chest pain, palpitations and leg swelling.  Gastrointestinal: Negative for abdominal distention, anal bleeding, blood in stool, constipation, diarrhea, nausea and vomiting.  Endocrine: Negative for cold intolerance, heat intolerance, polydipsia, polyphagia and polyuria.  Musculoskeletal: Positive for arthralgias and joint swelling.  Skin: Negative for color change, pallor, rash and wound.  Neurological: Positive for dizziness and headaches.  Hematological: Does not bruise/bleed easily.  Psychiatric/Behavioral: Positive for sleep disturbance. Negative for agitation, behavioral problems, confusion, decreased concentration, dysphoric mood, hallucinations, self-injury and suicidal ideas. The patient is nervous/anxious. The patient is not hyperactive.        Objective:   Physical Exam  Constitutional: She is oriented to person, place, and time. She appears well-developed and well-nourished. No distress.  HENT:  Head: Normocephalic and atraumatic.  Right Ear: External ear normal.  Left Ear: External ear normal.  Nose: Nose normal.  Mouth/Throat: Oropharynx is clear and moist.  Eyes: Pupils are equal,  round, and reactive to light. Conjunctivae and EOM are normal.  Cardiovascular: Normal rate, regular rhythm, normal heart sounds and intact distal pulses.  No murmur heard. Pulmonary/Chest: Effort normal and breath sounds normal. No stridor. No respiratory distress. She has no wheezes. She has no rales. She exhibits no tenderness.  Musculoskeletal: She exhibits edema and tenderness.       Right wrist: She exhibits tenderness and swelling.  Neurological: She is alert and oriented to person, place, and time. She displays no tremor and normal reflexes. She displays a negative Romberg sign.     Skin: Skin is warm and dry. Capillary refill takes less than 2 seconds. No rash noted. She is not diaphoretic. No erythema. No pallor.  Psychiatric: She has a normal mood and affect. Her behavior is normal. Judgment and thought content normal.  Nursing note and vitals reviewed.     Assessment & Plan:   1. Dizziness   2. Pure hypercholesterolemia   3. Bradycardia   4. Anxiety     Dizziness Continue all medications as directed. Continue to push fluids and walk daily. Follow heart healthy diet and continue to abstain from tobacco/vape/alcohol products. Continue regular follow-up with Neurology. Please call your Orthopedic Specialist, re: right wrist pain. Follow-up with Cardiologist as directed. Follow-up in 2 months to be evaluated for return to work status-Disability Form Completed today. Please call clinic with questions/concerns.  HLD (hyperlipidemia) 03/2018 Tot 207 TGs 41 HDL 60 LDL 139 Advised to follow heart health diet and increase regular exercise  Bradycardia Upcoming Holter Study   Anxiety Guided Imagery   Pt was in the office today for 35+ minutes, I spent >75% of time in face to face counseling of various medical concerns and in coordination of care  FOLLOW-UP:  Return in about 2 months (around 06/11/2018) for Regular Follow Up. 

## 2018-04-08 ENCOUNTER — Ambulatory Visit (INDEPENDENT_AMBULATORY_CARE_PROVIDER_SITE_OTHER): Payer: Managed Care, Other (non HMO) | Admitting: Neurology

## 2018-04-08 ENCOUNTER — Encounter: Payer: Self-pay | Admitting: Neurology

## 2018-04-08 VITALS — BP 121/82 | HR 61 | Ht 68.0 in | Wt 223.0 lb

## 2018-04-08 DIAGNOSIS — G4710 Hypersomnia, unspecified: Secondary | ICD-10-CM | POA: Diagnosis not present

## 2018-04-08 DIAGNOSIS — E669 Obesity, unspecified: Secondary | ICD-10-CM | POA: Diagnosis not present

## 2018-04-08 DIAGNOSIS — Z9884 Bariatric surgery status: Secondary | ICD-10-CM

## 2018-04-08 DIAGNOSIS — R42 Dizziness and giddiness: Secondary | ICD-10-CM

## 2018-04-08 DIAGNOSIS — R001 Bradycardia, unspecified: Secondary | ICD-10-CM | POA: Diagnosis not present

## 2018-04-08 DIAGNOSIS — G4730 Sleep apnea, unspecified: Secondary | ICD-10-CM

## 2018-04-08 NOTE — Progress Notes (Signed)
SLEEP MEDICINE CLINIC   Provider:  Larey Seat, MD   Primary Care Physician:  Velna Hatchet, MD / Dr Joycelyn Rua, MD Gynaecology   Referring Provider: Dr. Virl Axe of- [facility].    Chief Complaint  Patient presents with  . Follow-up    pt alone, rm 10. pt states she went to hospital with chest pain, nausea and dizziness and the dizziness has not gone away.  she states that if she is laying down she is fine. she doesnt notice if there is a certain thing that happens that causes it to start. she had bariatric surgery and lost 120 lbs and she doesnt snore or anything so she stopped using the CPAP machine. she used the cpap intially prior to surgery.. she didnt notice any difference in the headaches while using the cpap.   . Other    also on 11/16 she developed numbness and tingling in hands, she had orhrostatic vitals were completed and there was no dizziness associated. Mardene Celeste R.N.    HPI:  [name] [name] is a 37 y.o. female , seen here as in a new referral  from  Cardiology. I have the pleasure of seeing Mrs. [name] today, I mean by 37 year old female patient whom I had encountered last in July 2018 when she had undergone a baseline polysomnography, at the time her weight was 303 pounds with a BMI of 46, she was diagnosed with an AHI of 18.6/h but did not meet the criteria for split-night polysomnography for this reason she was asked to return for a separate CPAP titration.  I would like to add that the patient did retain some CO2 and she did have mostly hypopneas instead of frank apneas which indicated obesity hypoventilation.  She returned for the CPAP titration on 14 December 2016 and was titrated to CPAP at 6 and 7 cmH2O but central apneas emerged.  Her technologist changed her briefly to a bilevel -PAP which achieved no central apnea control. She felt poorly after BiPAP, and was using the CPAP device for about 5 month until she achieved weight loss from a gastric  sleeve procedure which Dr Kaylyn Lim performed on 02-19-2017.  She discontinued CPAP without talking to [place], and she has not been seen in follow up after PSG and titration study. She returns today for a presumed new problem- she has a possible seizure.   The patient presented after about 100 pound weight loss following the gastric sleeve gastrectomy with a history of polycystic ovarian syndrome, type 2 diabetes mellitus and chronic venous insufficiency in the setting of bradycardia.   Initially she thought she had anxiety symptoms so she took a low-dose diazepam but did not experience any improvement.  There was substernal chest discomfort with radiation into both arms and into the neck.  While standing in line at [facility] she felt like she was going to pass out first her feet seem to be numb and she felt that she could not carry her weight.  Then the discomfort as described as a pressure in her chest also occurred and she began breathing more fast.  Her heart rate was supposedly in the low 40s per minute.  She subsequently came to the emergency room and then to Dr. Caryl Comes.   EKG in the emergency room showed sinus bradycardia 49 bpm with incomplete right bundle branch block and low voltage QRS complexes.  Prior EKG from 2018 preoperative had shown a similar sinus arrhythmia.  She did not have  elevated cardiac enzymes she is not taking any AV nodal blocking agents, orthostatic vital signs were outlined.  She had a normal CMP.  On 16 November, while lying down,  her blood pressure was 119/72 mmhg  with a heart rate of only 44 bpm.  While seated, her  heart rate elevated to 65 bpm,  and standing heart rate elevated to 52 bpm.        She now is scheduled for a varicose vein procedure within the next week.  [facility]ard [facility] will [facility] her.  In the meantime she was fitted for high compression stockings with a gradient of 25 mmHg.  She had presented to the [facility] emergency room with a near  syncope and chest pains the symptoms have started just the night before she presented to Dr. Caryl Comes on 15 November.  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  On 10/29/2016 she underwent gynecological surgery and it was after the surgery was completed that her gynecologist noted her irregular breathing and heart rate ( ? ) which delayed her d/c from the recovery unit. It was Dr. Joycelyn Rua who then initiated to sleep consult to rule out apnea. She does not longer snore at home. She reports no problems with orthopnea, she can lay flat can sleep on one pillow. Recent Hgb A1c in May of this year was 4.9  when her bariatric surgeon evaluated her post gastric sleeve.  She changed her diet and she is now working out 4 times a week.    2018 note CD _ Sleep habits are as follows: Mrs. Eaddy reports that she sleeps in a cool, quiet and dark room and can sleep on one pillow, usually falls asleep on her side. She usually goes to bed between 9 and 10 PM but once she is asleep she cannot sleep through the night. There are 2-3 bathroom breaks each night, she also snores herself awake and has been choking in her sleep. She reports having vivid dreams that are usually unpleasant rather threatening or nightmarish in content. She reports having vivid dreams since childhood, not strictly related to her more recent diagnoses of anxiety and depression. She usually wakes up spontaneously around 6 AM, just before her alarm rings and sometimes wakes without alarm ringing. She reports not feeling refreshed or restored in the morning she reports a very dry mouth, hip pain and lower back pain also present, and she wakes up with headaches frequently.  Sleep medical history and family sleep history:  The patient reports that she suffered from night terrors as a child, she would scream loudly and it was difficult for her family to calm her down or wake her. Her father has been diagnosed with obstructive sleep apnea and uses CPAP, her  sister tested negative for OSA, paternal aunt has obstructive sleep apnea as well. The patient's sister had bariatric surgery, so did the paternal aunt.  No ENT surgeries such as tonsillectomy and septoplasty have taken Place, she does not have a history of traumatic brain injury neck injury or surgery to the airway.   Social history: Forensic psychologist.The patient has one biological child a son 64 years old. 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 She quit smoking in March 2018 used to smoke 1 pack per day. Alcohol use-1-3 drinks a month, caffeine use one caffeinated beverage a week now, small amounts of chocolate   Review of Systems: Out of a complete  14 system review, the patient complains of only the following symptoms, and all other reviewed systems are negative. Sleep headaches in the last 3 month, snoring, apnea, vivid dreams.   Epworth score after weight loss is 2 down from 12  Points pre weight loss and without CPAP , Fatigue severity score is now 10 from  40 points  , depression score n/a.      Social History   Socioeconomic History  . Marital status: Married    Spouse name: Keyonta Madrid   . Number of children: 1 son, born 2002  . Years of education:  college   . Highest education level:   Occupational History  . 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   Social Needs  . Financial resource strain: Not on file  . Food insecurity:    Worry: Not on file    Inability: Not on file  . Transportation needs:    Medical: Not on file    Non-medical: Not on file  Tobacco Use  . Smoking status: Former Smoker    Packs/day: 0.25    Years: 19.00    Pack years: 4.75    Types: Cigarettes    Last attempt to quit: 06/06/2016    Years since quitting: 1.8  . Smokeless tobacco: Never Used  Substance and Sexual Activity  . Alcohol use: No    Frequency: Never  . Drug use: No  . Sexual activity: Yes     Partners: Male    Birth control/protection: None, IUD  Lifestyle  . Physical activity:    Days per week: Not on file    Minutes per session: Not on file  . Stress: Not on file  Relationships  . Social connections:    Talks on phone: Not on file    Gets together: Not on file    Attends religious service: Not on file    Active member of club or organization: Not on file    Attends meetings of clubs or organizations: Not on file    Relationship status: Not on file  . Intimate partner violence:    Fear of current or ex partner: Not on file    Emotionally abused: Not on file    Physically abused: Not on file    Forced sexual activity: Not on file  Other Topics Concern  . Not on file  Social History Narrative  . Not on file    Family History  Problem Relation Age of Onset  . Diabetes Father   . Thyroid disease Father   . Hypertension Father   . Diabetes Maternal Grandmother   . Heart disease Maternal Grandmother   . Diabetes Maternal Grandfather   . Heart disease Maternal Grandfather   . Stroke Maternal Grandfather   . Diabetes Paternal Grandmother   . Diabetes Paternal Grandfather   . Heart disease Paternal Grandfather   . Alcohol abuse Paternal Grandfather   . Diabetes Other   . Obesity Other   . Sleep apnea Other   . Hyperlipidemia Other   . Hypertension Other   . Cancer Other   . Heart disease Other   . Hypertension Mother     Past Medical History:  Diagnosis Date  . Abnormal uterine bleeding   . Amenorrhea   . Anemia   . Anxiety    panic attacks  . Bradycardia   . Diabetes mellitus without complication ([place])    type 2 ---  5/26 no loner in pre diabetic range since bariatric sx 6 months ago  . Dysmenorrhea   . GERD (gastroesophageal reflux disease)    occasional - diet controlled and tums prn  . Hyperlipidemia    no meds -diet controlled  . Infertility, female   . Insulin resistance   . Migraine without aura   . Missed abortion    x 2 - both resolved  without surgery  . Obesity   . PCOS (polycystic ovarian syndrome)   . PCOS (polycystic ovarian syndrome)   . PONV (postoperative nausea and vomiting)    pe rpatient ; scopalamine patch very helpful   . Sleep apnea   . SVD (spontaneous vaginal delivery)    x 1  . Urinary incontinence     Past Surgical History:  Procedure Laterality Date  . CHOLECYSTECTOMY    . COLONOSCOPY  2017   polyps  . CYSTOSCOPY N/A 10/29/2016   Procedure: CYSTOSCOPY;  Surgeon: Salvadore Dom, MD;  Location: [facility];  Service: Gynecology;  Laterality: N/A;  . DILATATION & CURRETTAGE/HYSTEROSCOPY WITH RESECTOCOPE N/A 10/17/2012   Procedure: DILATATION & CURETTAGE/HYSTEROSCOPY WITH RESECTOCOPE; Possible Polypectomy, Possible Resectoscopic Myomectomy.;  Surgeon: Floyce Stakes. Pamala Hurry, MD;  Location: [facility];  Service: Gynecology;  Laterality: N/A;  1 hr.  Marland Kitchen DILATION AND CURETTAGE OF UTERUS    . ENDOMETRIAL BIOPSY    . ESOPHAGOGASTRODUODENOSCOPY ENDOSCOPY  2017   Normal  . HYSTEROSCOPY W/D&C N/A 12/01/2013   Procedure: IUD Removal;  Surgeon: Claiborne Billings A. Pamala Hurry, MD;  Location: [facility];  Service: Gynecology;  Laterality: N/A;  90 min.  . INTRAUTERINE DEVICE (IUD) INSERTION N/A 10/17/2012   Procedure: INTRAUTERINE DEVICE (IUD) INSERTION; Mirena;  Surgeon: Floyce Stakes. Pamala Hurry, MD;  Location: [facility];  Service: Gynecology;  Laterality: N/A;  . LAPAROSCOPIC GASTRIC SLEEVE RESECTION N/A 02/19/2017   Procedure: LAPAROSCOPIC GASTRIC SLEEVE RESECTION, UPPER ENDOSCOPY;  Surgeon: Johnathan Hausen, MD;  Location: [facility];  Service: General;  Laterality: N/A;  . LAPAROSCOPIC SALPINGO OOPHERECTOMY Left 10/29/2016   Procedure: LAPAROSCOPIC SALPINGO OOPHORECTOMY With anterior uterine biopsy;  Surgeon: Salvadore Dom, MD;  Location: [facility];  Service: Gynecology;  Laterality: Left;  . MOUTH SURGERY     wisdom teeth    Current Outpatient Medications  Medication Sig Dispense Refill  . aspirin EC 81 MG EC tablet Take 1 tablet (81 mg  total) by mouth daily.    . blood glucose meter kit and supplies KIT Dispense based on patient and insurance preference. Use up to four times daily as directed. (FOR ICD-9 250.00, 250.01). 1 each 0  . Calcium-Vitamin D-Vitamin K (CALCIUM SOFT CHEWS PO) Take by mouth daily.    . cyclobenzaprine (FLEXERIL) 10 MG tablet Take 1 tablet (10 mg total) by mouth 3 (three) times daily as needed for muscle spasms. 30 tablet 0  . diazepam (VALIUM) 5 MG tablet Take 1 tablet (5 mg total) by mouth every 8 (eight) hours as needed for anxiety. 60 tablet 0  . ibuprofen (ADVIL,MOTRIN) 200 MG tablet Take 200 mg by mouth every 6 (six) hours as needed for headache or moderate pain.    . IRON PO Take 45 mg by mouth every other day.    . levonorgestrel (MIRENA) 20 MCG/24HR IUD 1 each by Intrauterine route once.    . loratadine (CLARITIN) 10 MG tablet Take 10 mg by mouth daily as needed for allergies.    . Multiple Vitamins-Iron (MULTIVITAMINS WITH IRON) TABS tablet Take 1 tablet by mouth 2 (two)  times daily. CHEWABLE    . nystatin cream (MYCOSTATIN) Apply 1 application topically 2 (two) times daily. Apply to affected area BID for up to 7 days. (Patient taking differently: Apply 1 application topically 2 (two) times daily as needed for dry skin (rash). A) 30 g 2  . ondansetron (ZOFRAN) 4 MG tablet Take 1 tablet (4 mg total) by mouth every 8 (eight) hours as needed for nausea or vomiting. 30 tablet 0  . OVER THE COUNTER MEDICATION Take 500 mg by mouth 3 (three) times daily. Bariatric calcium '500mg'$      . pantoprazole (PROTONIX) 40 MG tablet Take 40 mg by mouth daily as needed (reflux).   3   No current facility-administered medications for this visit.     Allergies as of 04/08/2018 - Review Complete 04/08/2018  Allergen Reaction Noted  . Iohexol Hives 10/18/2008    Vitals: BP 121/82 (BP Location: Left Arm, Patient Position: Standing) Comment: DIZZINESS NOTED ABOUT 30 SEC AFTER STANDING  Pulse 61   Ht '5\' 8"'$  (1.727 m)    Wt 223 lb (101.2 kg)   BMI 33.91 kg/m    Mrs. Schriner describes a feeling of lightheadedness after about 30 seconds of standing motionless.  Seated across for me now she states that she also becomes slightly lightheaded now and it affects the right side of her body, visual field, a sensation of numbness and tingling coming over the right side.  There is associated headache, there is nausea.  Wears no compression stockings today.   Complicated migraine ? Marland Kitchen   Last Weight:  Wt Readings from Last 1 Encounters:  04/08/18 223 lb (101.2 kg)   LAG:TXMI mass index is 33.91 kg/m.  Post gastric sleeve -     Last Height:   Ht Readings from Last 1 Encounters:  04/08/18 '5\' 8"'$  (1.727 m)    Physical exam:  General: The patient is awake, alert and appears not in acute distress. The patient is well groomed. Head: Normocephalic, atraumatic. Neck is supple. Mallampati 1- pierced tongue.  neck circumference: 15 from 17.5 " . Nasal airflow patent -, Retrognathia is not seen.  Cardiovascular:  Regular rate and rhythm , without  murmurs or carotid bruit, and without distended neck veins. Respiratory: Lungs are clear to auscultation. Skin:  Without evidence of edema, or rash Trunk: BMI is now 34 from 46. The patient's posture is erect .   Neurologic exam : The patient is awake and alert, oriented to place and time.   Cranial nerves: Pupils are equal and briskly reactive to light.  Funduscopic exam without evidence of pallor or edema. Extraocular movements  in vertical and horizontal planes intact and without nystagmus. No dizziness with head movements. Visual fields by finger perimetry are intact. Hearing to finger rub intact. Facial sensation intact to fine touch.Facial motor strength is symmetric and tongue and uvula move midline. Shoulder shrug was symmetrical.   Motor exam: Normal tone, muscle bulk and symmetric strength in all extremities. Sensory:  Fine touch, pinprick and vibration were tested  normal in all extremities. Proprioception tested in the upper extremities was normal.   Coordination: Rapid alternating movements in the fingers/hands was normal.  Finger-to-nose maneuver  normal without evidence of ataxia, dysmetria or tremor. Gait and station: Patient walks without assistive device-  Stance is stable and normal.  Turns with 3  Steps. Romberg testing is negative. Deep tendon reflexes: in the upper and lower extremities are symmetric and intact. Babinski maneuver response is downgoing.   Assessment:  After physical and neurologic examination, review of laboratory studies,  Personal review of imaging studies, reports of other /same  Imaging studies, results of polysomnography and / or neurophysiology testing and pre-existing records as far as provided in visit., my assessment is :   1)  Unexplained bradycardia with venous insufficiency - incomplete RBBB.   A repeat orthostatic blood pressure and heart rate show again that in supine blood pressure was 121/79 mmHg heart rate was 49 regular beats per minute, seated heart rate was 52 bpm and blood pressure 118/83, standing heart rate went up to 61 blood pressure remained at 121/82.  Again she noted dizziness after standing for about 30 seconds, she did not notice dizziness while in motion, she can walk without a wide-based and she can turn with 3 steps there is no vertigo noted.  This finding seem to be related to a low heart rate response. She had one fall in a syncope on 03-27-2018 which let to a bone injury, right , dominant arm.  Dysautonomia? Postural bradycardia syndrome ?    2) Migraines seem to affect her- one sided head pain and numbness -always on the right, and she noted these since she has had near faiting and finally syncopal episodes.   PLan:  1) Hydration, hydration and use the compression stockings.  2)Mrs. Mette has still some risk factors for obstructive sleep apnea and I like for her to use the machine ( an auto  titration device 0 for 6 days , so I can see the therapeutic data " (AHI and air leak).  3) migraine prevention ? She is not a candidate with headaches only one day in every 3 weeks, migraine one every 3 month. I like for her to use Excedrine prn OTC.      Plan:  Treatment plan and additional workup : My goal is to avoid further fainting spells, I doubt seizures at origin. No loss of awareness, no tongue bite, no incontinence.   The patient was advised of the nature of the diagnosed disorder , the treatment options and the  risks for general health and wellness arising from not treating the condition.  I spent more than 40 minutes of face to face time with the patient. Greater than 50% of time was spent in counseling and coordination of care. We have discussed the diagnosis and differential and I answered the patient's questions.     Rv with Np in 2-3 month.   Larey Seat, MD 29/09/2839, 32:44 AM  Certified in Neurology by ABPN Certified in [place] by [facility] [address] [place], [place] [address]

## 2018-04-08 NOTE — Patient Instructions (Signed)
Bradycardia, Adult °Bradycardia is a slower-than-normal heartbeat. A normal resting heart rate for an adult ranges from 60 to 100 beats per minute. With bradycardia, the resting heart rate is less than 60 beats per minute. °Bradycardia can prevent enough oxygen from reaching certain areas of your body when you are active. It can be serious if it keeps enough oxygen from reaching your brain and other parts of your body. Bradycardia is not a problem for everyone. For some healthy adults, a slow resting heart rate is normal. °What are the causes? °This condition may be caused by: °· A problem with the heart, including: °? A problem with the heart's electrical system, such as a heart block. °? A problem with the heart's natural pacemaker (sinus node). °? Heart disease. °? A heart attack. °? Heart damage. °? A heart infection. °? A heart condition that is present at birth (congenital heart defect). °· Certain medicines that treat heart conditions. °· Certain conditions, such as hypothyroidism and obstructive sleep apnea. °· Problems with the balance of chemicals and other substances, like potassium, in the blood. ° °What increases the risk? °This condition is more likely to develop in adults who: °· Are age 65 or older. °· Have high blood pressure (hypertension), high cholesterol (hyperlipidemia), or diabetes. °· Drink heavily, use tobacco or nicotine products, or use drugs. °· Are stressed. ° °What are the signs or symptoms? °Symptoms of this condition include: °· Light-headedness. °· Feeling faint or fainting. °· Fatigue and weakness. °· Shortness of breath. °· Chest pain (angina). °· Drowsiness. °· Confusion. °· Dizziness. ° °How is this diagnosed? °This condition may be diagnosed based on: °· Your symptoms. °· Your medical history. °· A physical exam. ° °During the exam, your health care provider will listen to your heartbeat and check your pulse. To confirm the diagnosis, your health care provider may order tests,  such as: °· Blood tests. °· An electrocardiogram (ECG). This test records the heart's electrical activity. The test can show how fast your heart is beating and whether the heartbeat is steady. °· A test in which you wear a portable device (event recorder or Holter monitor) to record your heart's electrical activity while you go about your day. °· An exercise test. ° °How is this treated? °Treatment for this condition depends on the cause of the condition and how severe your symptoms are. Treatment may involve: °· Treatment of the underlying condition. °· Changing your medicines or how much medicine you take. °· Having a small, battery-operated device called a pacemaker implanted under the skin. When bradycardia occurs, this device can be used to increase your heart rate and help your heart to beat in a regular rhythm. ° °Follow these instructions at home: °Lifestyle ° °· Manage any health conditions that contribute to bradycardia as told by your health care provider. °· Follow a heart-healthy diet. A nutrition specialist (dietitian) can help to educate you about healthy food options and changes. °· Follow an exercise program that is approved by your health care provider. °· Maintain a healthy weight. °· Try to reduce or manage your stress, such as with yoga or meditation. If you need help reducing stress, ask your health care provider. °· Do not use use any products that contain nicotine or tobacco, such as cigarettes and e-cigarettes. If you need help quitting, ask your health care provider. °· Do not use illegal drugs. °· Limit alcohol intake to no more than 1 drink per day for nonpregnant women and 2 drinks per   day for men. One drink equals 12 oz of beer, 5 oz of wine, or 1½ oz of hard liquor. °General instructions °· Take over-the-counter and prescription medicines only as told by your health care provider. °· Keep all follow-up visits as directed by your health care provider. This is important. °How is this  prevented? °In some cases, bradycardia may be prevented by: °· Treating underlying medical problems. °· Stopping behaviors or medicines that can trigger the condition. ° °Contact a health care provider if: °· You feel light-headed or dizzy. °· You almost faint. °· You feel weak or are easily fatigued during physical activity. °· You experience confusion or have memory problems. °Get help right away if: °· You faint. °· You have an irregular heartbeat (palpitations). °· You have chest pain. °· You have trouble breathing. °This information is not intended to replace advice given to you by your health care provider. Make sure you discuss any questions you have with your health care provider. °Document Released: 01/13/2002 Document Revised: 12/20/2015 Document Reviewed: 10/13/2015 °Elsevier Interactive Patient Education © 2017 [employer] ° °

## 2018-04-10 ENCOUNTER — Encounter: Payer: Self-pay | Admitting: Adult Health

## 2018-04-10 ENCOUNTER — Ambulatory Visit (INDEPENDENT_AMBULATORY_CARE_PROVIDER_SITE_OTHER): Payer: Managed Care, Other (non HMO) | Admitting: Adult Health

## 2018-04-10 DIAGNOSIS — E7800 Pure hypercholesterolemia, unspecified: Secondary | ICD-10-CM

## 2018-04-10 DIAGNOSIS — R001 Bradycardia, unspecified: Secondary | ICD-10-CM

## 2018-04-10 DIAGNOSIS — R42 Dizziness and giddiness: Secondary | ICD-10-CM | POA: Diagnosis not present

## 2018-04-10 DIAGNOSIS — F419 Anxiety disorder, unspecified: Secondary | ICD-10-CM

## 2018-04-10 NOTE — Patient Instructions (Signed)
Dizziness Dizziness is a common problem. It is a feeling of unsteadiness or light-headedness. You may feel like you are about to faint. Dizziness can lead to injury if you stumble or fall. Anyone can become dizzy, but dizziness is more common in older adults. This condition can be caused by a number of things, including medicines, dehydration, or illness. Follow these instructions at home: Eating and drinking  Drink enough fluid to keep your urine clear or pale yellow. This helps to keep you from becoming dehydrated. Try to drink more clear fluids, such as water.  Do not drink alcohol.  Limit your caffeine intake if told to do so by your health care provider. Check ingredients and nutrition facts to see if a food or beverage contains caffeine.  Limit your salt (sodium) intake if told to do so by your health care provider. Check ingredients and nutrition facts to see if a food or beverage contains sodium. Activity  Avoid making quick movements. ? Rise slowly from chairs and steady yourself until you feel okay. ? In the morning, first sit up on the side of the bed. When you feel okay, stand slowly while you hold onto something until you know that your balance is fine.  If you need to stand in one place for a long time, move your legs often. Tighten and relax the muscles in your legs while you are standing.  Do not drive or use heavy machinery if you feel dizzy.  Avoid bending down if you feel dizzy. Place items in your home so that they are easy for you to reach without leaning over. Lifestyle  Do not use any products that contain nicotine or tobacco, such as cigarettes and e-cigarettes. If you need help quitting, ask your health care provider.  Try to reduce your stress level by using methods such as yoga or meditation. Talk with your health care provider if you need help to manage your stress. General instructions  Watch your dizziness for any changes.  Take over-the-counter and  prescription medicines only as told by your health care provider. Talk with your health care provider if you think that your dizziness is caused by a medicine that you are taking.  Tell a friend or a family member that you are feeling dizzy. If he or she notices any changes in your behavior, have this person call your health care provider.  Keep all follow-up visits as told by your health care provider. This is important. Contact a health care provider if:  Your dizziness does not go away.  Your dizziness or light-headedness gets worse.  You feel nauseous.  You have reduced hearing.  You have new symptoms.  You are unsteady on your feet or you feel like the room is spinning. Get help right away if:  You vomit or have diarrhea and are unable to eat or drink anything.  You have problems talking, walking, swallowing, or using your arms, hands, or legs.  You feel generally weak.  You are not thinking clearly or you have trouble forming sentences. It may take a friend or family member to notice this.  You have chest pain, abdominal pain, shortness of breath, or sweating.  Your vision changes.  You have any bleeding.  You have a severe headache.  You have neck pain or a stiff neck.  You have a fever. These symptoms may represent a serious problem that is an emergency. Do not wait to see if the symptoms will go away. Get medical help   right away. Call your local emergency services (911 in the [place] Do not drive yourself to the hospital. Summary  Dizziness is a feeling of unsteadiness or light-headedness. This condition can be caused by a number of things, including medicines, dehydration, or illness.  Anyone can become dizzy, but dizziness is more common in older adults.  Drink enough fluid to keep your urine clear or pale yellow. Do not drink alcohol.  Avoid making quick movements if you feel dizzy. Monitor your dizziness for any changes. This information is not intended to  replace advice given to you by your health care provider. Make sure you discuss any questions you have with your health care provider. Document Released: 10/17/2000 Document Revised: 05/26/2016 Document Reviewed: 05/26/2016 Elsevier Interactive Patient Education  2018 [place] all medications as directed. Continue to push fluids and walk daily. Follow heart healthy diet and continue to abstain from tobacco/vape/alcohol products. Continue regular follow-up with Neurology. Please call your Orthopedic Specialist, re: right wrist pain. Follow-up with Cardiologist as directed. Follow-up in 2 months to be evaluated for return to work status. Please call clinic with questions/concerns. FEEL BETTER!

## 2018-04-10 NOTE — Assessment & Plan Note (Signed)
Guided Imagery

## 2018-04-10 NOTE — Assessment & Plan Note (Signed)
03/2018 Tot 207 TGs 41 HDL 60 LDL 139 Advised to follow heart health diet and increase regular exercise

## 2018-04-10 NOTE — Assessment & Plan Note (Signed)
Continue all medications as directed. Continue to push fluids and walk daily. Follow heart healthy diet and continue to abstain from tobacco/vape/alcohol products. Continue regular follow-up with Neurology. Please call your Orthopedic Specialist, re: right wrist pain. Follow-up with Cardiologist as directed. Follow-up in 2 months to be evaluated for return to work status-Disability Form Completed today. Please call clinic with questions/concerns.

## 2018-04-10 NOTE — Assessment & Plan Note (Signed)
Upcoming Holter Study

## 2018-04-15 MED FILL — NITROFURANTOIN MONO-MCR 100: 100 | 7 days supply | Qty: 14 | Fill #0

## 2018-04-15 MED FILL — LORazepam 1 MG TABS: 1 | 1 days supply | Qty: 2 | Fill #0

## 2018-04-17 ENCOUNTER — Ambulatory Visit (INDEPENDENT_AMBULATORY_CARE_PROVIDER_SITE_OTHER): Payer: Managed Care, Other (non HMO) | Admitting: Vascular Surgery

## 2018-04-17 ENCOUNTER — Ambulatory Visit: Payer: Managed Care, Other (non HMO) | Admitting: *Deleted

## 2018-04-17 ENCOUNTER — Encounter: Payer: Self-pay | Admitting: Vascular Surgery

## 2018-04-17 VITALS — BP 121/81 | HR 72 | Temp 98.0°F | Resp 18 | Ht 68.0 in | Wt 220.0 lb

## 2018-04-17 DIAGNOSIS — R238 Other skin changes: Secondary | ICD-10-CM

## 2018-04-17 DIAGNOSIS — I83893 Varicose veins of bilateral lower extremities with other complications: Secondary | ICD-10-CM

## 2018-04-17 DIAGNOSIS — L909 Atrophic disorder of skin, unspecified: Principal | ICD-10-CM

## 2018-04-17 NOTE — Progress Notes (Signed)
Patient name: [name] [name] MRN: [id number] DOB: [birth year] Sex: female  REASON FOR VISIT:   For endovenous laser ablation of the left great saphenous vein with greater than 20 stab phlebectomies.  HPI:   [name] [name] is a pleasant 38 y.o. female who I last saw on 03/20/2018.  The patient had painful varicose veins and on the left and significant reflux in the great saphenous vein.  The vein by my exam was significantly dilated and feeds a large cluster of varicose veins in the distal thigh.  I felt that the vein could be cannulated just above this cluster varicose veins in the distal thigh.  Patient also had significant venous reflux on the right and the great saphenous vein.  I felt she was a candidate for staged bilateral endovenous laser ablations of the great saphenous veins.  The plan was to start on the left side.  She had more significant symptoms on this side.  She would also require > 20 stab phlebectomies on the left.  She could subsequently have endovenous laser ablation of the right great saphenous vein with 10-20 stab phlebectomies.  Current Outpatient Medications  Medication Sig Dispense Refill  . aspirin EC 81 MG EC tablet Take 1 tablet (81 mg total) by mouth daily.    . blood glucose meter kit and supplies KIT Dispense based on patient and insurance preference. Use up to four times daily as directed. (FOR ICD-9 250.00, 250.01). 1 each 0  . Calcium-Vitamin D-Vitamin K (CALCIUM SOFT CHEWS PO) Take by mouth daily.    . cyclobenzaprine (FLEXERIL) 10 MG tablet Take 1 tablet (10 mg total) by mouth 3 (three) times daily as needed for muscle spasms. 30 tablet 0  . diazepam (VALIUM) 5 MG tablet Take 1 tablet (5 mg total) by mouth every 8 (eight) hours as needed for anxiety. 60 tablet 0  . ibuprofen (ADVIL,MOTRIN) 200 MG tablet Take 200 mg by mouth every 6 (six) hours as needed for headache or moderate pain.    . IRON PO Take 45 mg by mouth every other day.    . levonorgestrel  (MIRENA) 20 MCG/24HR IUD 1 each by Intrauterine route once.    . loratadine (CLARITIN) 10 MG tablet Take 10 mg by mouth daily as needed for allergies.    . Multiple Vitamins-Iron (MULTIVITAMINS WITH IRON) TABS tablet Take 1 tablet by mouth 2 (two) times daily. CHEWABLE    . nystatin cream (MYCOSTATIN) Apply 1 application topically 2 (two) times daily. Apply to affected area BID for up to 7 days. (Patient taking differently: Apply 1 application topically 2 (two) times daily as needed for dry skin (rash). A) 30 g 2  . ondansetron (ZOFRAN) 4 MG tablet Take 1 tablet (4 mg total) by mouth every 8 (eight) hours as needed for nausea or vomiting. 30 tablet 0  . OVER THE COUNTER MEDICATION Take 500 mg by mouth 3 (three) times daily. Bariatric calcium '500mg'$      . pantoprazole (PROTONIX) 40 MG tablet Take 40 mg by mouth daily as needed (reflux).   3   No current facility-administered medications for this visit.     REVIEW OF SYSTEMS:  '[X]'$  denotes positive finding, '[ ]'$  denotes negative finding Vascular    Leg swelling    Cardiac    Chest pain or chest pressure:    Shortness of breath upon exertion:    Short of breath when lying flat:    Irregular heart rhythm:    Constitutional  Fever or chills:     PHYSICAL EXAM:   Vitals:   04/17/18 0843  BP: 121/81  Pulse: 72  Resp: 18  Temp: 98 F (36.7 C)  SpO2: 98%  Weight: 220 lb (99.8 kg)  Height: '5\' 8"'$  (1.727 m)    GENERAL: The patient is a well-nourished female, in no acute distress. The vital signs are documented above.  DATA:   No new data  MEDICAL ISSUES:   LASER ABLATION LEFT GREAT SAPHENOUS VEIN WITH GREATER THAN 20 STAB PHLEBECTOMIES: The patient was brought to the exam room and her dilated varicose veins were marked with the patient standing.  I interrogated the great saphenous vein with the SonoSite.  The saphenofemoral junction was somewhat tortuous and I felt it would be best to stay well below this tortuous segment.  The left  leg was prepped and draped in usual sterile fashion.  Under ultrasound guidance, after the skin was anesthetized, the left great saphenous vein was cannulated just above the knee and a micropuncture sheath introduced over a wire.  The wire was then advanced up to the proximal saphenous vein below the saphenofemoral junction.  The sheath was advanced over the wire and the wire and dilator removed.  The laser fiber was positioned at the distal end of the sheath and the sheath slightly retracted.  This was positioned just below the saphenofemoral junction approximately 2 cm.  After the skin was anesthetized the entire saphenous canal was anesthetized with tumescent anesthesia.  Next the patient was placed in Trendelenburg.  Laser ablation was performed of the left great saphenous vein.  The pullback rate was approximately 1 to 2 mm/s.  2520 J were used to the distal thigh.  Next using multiple stab incisions (greater than 20) small incisions were made with an 11 blade over the marked varicose veins.  The wounds were then retracted into the wound with a hook and then bluntly removed using hemostats.  Pressure was held for hemostasis.  Sterile dressing was applied.  The patient tolerated the procedure well.  She will return in 1 week for duplex.  When I see her back we can discuss staged endovenous laser ablation of the right great saphenous vein and 10-20 stab phlebectomies.  Deitra Mayo [facility] [phone]

## 2018-04-17 NOTE — Progress Notes (Signed)
     Laser Ablation Procedure    Date: 04/17/2018   [name] [name] [facility] DOB:[birth year]  Consent signed: Yes    Surgeon:  Dr. Sherren Mocha Early  Procedure: Laser Ablation: left Greater Saphenous Vein  BP 121/81   Pulse 72   Temp 98 F (36.7 C)   Resp 18   Ht 5\' 8"  (1.727 m)   Wt 220 lb (99.8 kg)   SpO2 98%   BMI 33.45 kg/m   Tumescent Anesthesia: 600 cc 0.9% NaCl with 50 cc Lidocaine HCL with 1% Epi and 15 cc 8.4% NaHCO3  Local Anesthesia: 22 cc Lidocaine HCL and NaHCO3 (ratio 2:1)  15 watts continuous mode        Total energy: 2520   Total time: 2:47    Patient tolerated procedure well  Notes: Ativan: Has severe anxiety  Description of Procedure:  After marking the course of the secondary varicosities, the patient was placed on the operating table in the supine position, and the left leg was prepped and draped in sterile fashion.   Local anesthetic was administered and under ultrasound guidance the saphenous vein was accessed with a micro needle and guide wire; then the mirco puncture sheath was placed.  A guide wire was inserted saphenofemoral junction , followed by a 5 french sheath.  The position of the sheath and then the laser fiber below the junction was confirmed using the ultrasound.  Tumescent anesthesia was administered along the course of the saphenous vein using ultrasound guidance. The patient was placed in Trendelenburg position and protective laser glasses were placed on patient and staff, and the laser was fired at 15 watts continuous mode advancing 1-16mm/second for a total of 2520 joules.     Steri strips were applied to the stab wounds and ABD pads and thigh high compression stockings were applied.  Ace wrap bandages were applied over the phlebectomy sites and at the top of the saphenofemoral junction. Blood loss was less than 15 cc.  The patient ambulated out of the operating room having tolerated the procedure well.

## 2018-04-21 ENCOUNTER — Ambulatory Visit (INDEPENDENT_AMBULATORY_CARE_PROVIDER_SITE_OTHER): Payer: Managed Care, Other (non HMO)

## 2018-04-21 DIAGNOSIS — G4710 Hypersomnia, unspecified: Secondary | ICD-10-CM | POA: Diagnosis not present

## 2018-04-21 DIAGNOSIS — R001 Bradycardia, unspecified: Secondary | ICD-10-CM | POA: Diagnosis not present

## 2018-04-21 DIAGNOSIS — Z9884 Bariatric surgery status: Secondary | ICD-10-CM | POA: Diagnosis not present

## 2018-04-21 DIAGNOSIS — R42 Dizziness and giddiness: Secondary | ICD-10-CM | POA: Diagnosis not present

## 2018-04-21 DIAGNOSIS — G4730 Sleep apnea, unspecified: Secondary | ICD-10-CM

## 2018-04-23 ENCOUNTER — Telehealth (HOSPITAL_COMMUNITY): Payer: Self-pay | Admitting: Surgery

## 2018-04-23 NOTE — Telephone Encounter (Signed)
Attempted to contact patient to confirm appointments scheduled 04/24/2018. [facility]

## 2018-04-24 ENCOUNTER — Encounter (HOSPITAL_COMMUNITY): Payer: Self-pay

## 2018-04-24 ENCOUNTER — Ambulatory Visit (HOSPITAL_COMMUNITY)
Admission: RE | Admit: 2018-04-24 | Discharge: 2018-04-24 | Disposition: A | Discharge status: Home / Self Care | Payer: Managed Care, Other (non HMO) | Source: Ambulatory Visit | Attending: Vascular Surgery | Admitting: Vascular Surgery

## 2018-04-24 ENCOUNTER — Encounter: Payer: Self-pay | Admitting: Vascular Surgery

## 2018-04-24 ENCOUNTER — Ambulatory Visit (INDEPENDENT_AMBULATORY_CARE_PROVIDER_SITE_OTHER): Payer: Self-pay | Admitting: Vascular Surgery

## 2018-04-24 VITALS — BP 119/72 | HR 62 | Temp 97.4°F | Resp 16 | Ht 68.0 in | Wt 220.0 lb

## 2018-04-24 DIAGNOSIS — Z48812 Encounter for surgical aftercare following surgery on the circulatory system: Secondary | ICD-10-CM

## 2018-04-24 DIAGNOSIS — I8393 Asymptomatic varicose veins of bilateral lower extremities: Secondary | ICD-10-CM | POA: Insufficient documentation

## 2018-04-24 DIAGNOSIS — I83893 Varicose veins of bilateral lower extremities with other complications: Secondary | ICD-10-CM

## 2018-04-24 NOTE — Progress Notes (Signed)
Patient name: [name] [name] MRN: [id number] DOB: 08/31/1980 Sex: female  REASON FOR VISIT:   Follow-up after endovenous laser ablation of the left great saphenous vein with greater than 20 stab phlebectomies.  HPI:   [name] [name] is a pleasant 37 y.o. 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  She had significant reflux in the left great saphenous vein and large dilated varicose veins in her distal thigh.  She underwent laser ablation of the left great saphenous vein and greater than 20 stab phlebectomies on 04/17/2018.  The vein was ablated to the distal thigh.  She returns for her follow-up visit.  She has done well since her procedure.  She does feel better when she is wearing her compression stockings.  She has another week and the thigh-high stockings.  She has been taking ibuprofen as needed for pain.  She is had no significant swelling.  Current Outpatient Medications  Medication Sig Dispense Refill  . aspirin EC 81 MG EC tablet Take 1 tablet (81 mg total) by mouth daily.    . blood glucose meter kit and supplies KIT Dispense based on patient and insurance preference. Use up to four times daily as directed. (FOR ICD-9 250.00, 250.01). 1 each 0  . Calcium-Vitamin D-Vitamin K (CALCIUM SOFT CHEWS PO) Take by mouth daily.    . cyclobenzaprine (FLEXERIL) 10 MG tablet Take 1 tablet (10 mg total) by mouth 3 (three) times daily as needed for muscle spasms. 30 tablet 0  . diazepam (VALIUM) 5 MG tablet Take 1 tablet (5 mg total) by mouth every 8 (eight) hours as needed for anxiety. 60 tablet 0  . ibuprofen (ADVIL,MOTRIN) 200 MG tablet Take 200 mg by mouth every 6 (six) hours as needed for headache or moderate pain.    . IRON PO Take 45 mg by mouth every other day.    . levonorgestrel (MIRENA) 20 MCG/24HR IUD 1 each by Intrauterine route once.    . loratadine (CLARITIN) 10 MG tablet Take 10 mg by mouth daily as needed for allergies.    . Multiple  Vitamins-Iron (MULTIVITAMINS WITH IRON) TABS tablet Take 1 tablet by mouth 2 (two) times daily. CHEWABLE    . nystatin cream (MYCOSTATIN) Apply 1 application topically 2 (two) times daily. Apply to affected area BID for up to 7 days. (Patient taking differently: Apply 1 application topically 2 (two) times daily as needed for dry skin (rash). A) 30 g 2  . ondansetron (ZOFRAN) 4 MG tablet Take 1 tablet (4 mg total) by mouth every 8 (eight) hours as needed for nausea or vomiting. 30 tablet 0  . OVER THE COUNTER MEDICATION Take 500 mg by mouth 3 (three) times daily. Bariatric calcium '500mg'$      . pantoprazole (PROTONIX) 40 MG tablet Take 40 mg by mouth daily as needed (reflux).   3   No current facility-administered medications for this visit.     REVIEW OF SYSTEMS:  '[X]'$  denotes positive finding, '[ ]'$  denotes negative finding Vascular    Leg swelling    Cardiac    Chest pain or chest pressure:    Shortness of breath upon exertion:    Short of breath when lying flat:    Irregular heart rhythm:    Constitutional    Fever or chills:     PHYSICAL EXAM:   Vitals:   04/24/18 1336  BP: 119/72  Pulse: 62  Resp: 16  Temp: (!) 97.4 F (36.3 C)  SpO2: 98%  Weight: 220 lb (99.8 kg)  Height: '5\' 8"'$  (1.727 m)    GENERAL: The patient is a well-nourished female, in no acute distress. The vital signs are documented above. CARDIOVASCULAR: There is a regular rate and rhythm. PULMONARY: There is good air exchange bilaterally without wheezing or rales. VASCULAR: The patient has no significant bruising in the left leg.  Incisions are healing nicely from the stabs. There is mild left leg swelling.  The patient has significantly dilated truncal varicosities in her medial right thigh and leg.  DATA:   VENOUS DUPLEX: I have independently interpreted her venous duplex scan today.  There is no evidence of DVT in the left leg.  The saphenous vein is closed to within 3.2 cm of the saphenofemoral  junction.  MEDICAL ISSUES:   STATUS POST LASER ABLATION LEFT GREAT SAPHENOUS VEIN WITH GREATER THAN 20 STAB PHLEBECTOMIES: The patient is doing well after her procedure last week.  There is no evidence of DVT and the vein has been successfully closed.  She does has painful varicose veins of the right lower extremity and has failed conservative treatment.  She will return on 05/08/2022 laser ablation of the right great saphenous vein and stab phlebectomies.  Deitra Mayo [facility] [phone]

## 2018-05-08 ENCOUNTER — Other Ambulatory Visit: Payer: Self-pay

## 2018-05-08 ENCOUNTER — Encounter: Payer: Self-pay | Admitting: Vascular Surgery

## 2018-05-08 ENCOUNTER — Ambulatory Visit (INDEPENDENT_AMBULATORY_CARE_PROVIDER_SITE_OTHER): Payer: Managed Care, Other (non HMO) | Admitting: Vascular Surgery

## 2018-05-08 VITALS — BP 109/64 | HR 80 | Temp 97.2°F | Resp 16 | Ht 68.0 in | Wt 221.0 lb

## 2018-05-08 DIAGNOSIS — I83893 Varicose veins of bilateral lower extremities with other complications: Secondary | ICD-10-CM | POA: Diagnosis not present

## 2018-05-08 HISTORY — PX: ENDOVENOUS ABLATION SAPHENOUS VEIN W/ LASER: SUR449

## 2018-05-08 NOTE — Progress Notes (Signed)
     Laser Ablation Procedure    Date: 05/08/2018   [name] [name] [facility] DOB:01-18-1981  Consent signed: Yes    Surgeon:  Dr. Deitra Mayo   Procedure: Laser Ablation: right Greater Saphenous Vein  BP 109/64 (BP Location: Left Arm, Patient Position: Sitting, Cuff Size: Large)   Pulse 80   Temp (!) 97.2 F (36.2 C) (Oral)   Resp 16   Ht 5\' 8"  (1.727 m)   Wt 221 lb (100.2 kg)   SpO2 100%   BMI 33.60 kg/m   Tumescent Anesthesia: 700 cc 0.9% NaCl with 50 cc Lidocaine HCL 1% and 15 cc 8.4% NaHCO3  Local Anesthesia: 13 cc Lidocaine HCL and NaHCO3 (ratio 2:1)  15 watts continuous mode        Total energy: 2126 Joules   Total time: 2.21    Stab Phlebectomy: > 20  Sites: Thigh and Calf  Patient tolerated procedure well  Notes: Ativan 1 mg. Taken by patient at 7:00AM on 05-08-2018.   Description of Procedure:  After marking the course of the secondary varicosities, the patient was placed on the operating table in the supine position, and the right leg was prepped and draped in sterile fashion.   Local anesthetic was administered and under ultrasound guidance the saphenous vein was accessed with a micro needle and guide wire; then the mirco puncture sheath was placed.  A guide wire was inserted saphenofemoral junction , followed by a 5 french sheath.  The position of the sheath and then the laser fiber below the junction was confirmed using the ultrasound.  Tumescent anesthesia was administered along the course of the saphenous vein using ultrasound guidance. The patient was placed in Trendelenburg position and protective laser glasses were placed on patient and staff, and the laser was fired at 15 watts continuous mode advancing 1-69mm/second for a total of 2126 joules.   For stab phlebectomies, local anesthetic was administered at the previously marked varicosities, and tumescent anesthesia was administered around the vessels.  Greater than 20 stab wounds were made using the tip of an  11 blade. And using the vein hook, the phlebectomies were performed using a hemostat to avulse the varicosities.  Adequate hemostasis was achieved.     Steri strips were applied to the stab wounds and ABD pads and thigh high compression stockings were applied.  Ace wrap bandages were applied over the phlebectomy sites and at the top of the saphenofemoral junction. Blood loss was less than 15 cc.  The patient ambulated out of the operating room having tolerated the procedure well.

## 2018-05-08 NOTE — Progress Notes (Signed)
Patient name: [name] [name] MRN: [id number] DOB: April 19, 1981 Sex: female  REASON FOR VISIT:   For laser ablation of the right great saphenous vein and stab phlebectomies.  HPI:   [name] [name] is a pleasant 38 y.o. female who presents for laser ablation of the right great saphenous vein and stab phlebectomies.  She underwent endovenous laser ablation of the left great saphenous vein at that time with greater than 20 stab phlebectomies.  She presents now to have the right leg worked on.  I last saw her on 04/24/2018.  That time she had successful closure of the vein to within 3.2 cm of the saphenofemoral junction.  Current Outpatient Medications  Medication Sig Dispense Refill  . aspirin EC 81 MG EC tablet Take 1 tablet (81 mg total) by mouth daily.    . blood glucose meter kit and supplies KIT Dispense based on patient and insurance preference. Use up to four times daily as directed. (FOR ICD-9 250.00, 250.01). 1 each 0  . Calcium-Vitamin D-Vitamin K (CALCIUM SOFT CHEWS PO) Take by mouth daily.    . cyclobenzaprine (FLEXERIL) 10 MG tablet Take 1 tablet (10 mg total) by mouth 3 (three) times daily as needed for muscle spasms. 30 tablet 0  . diazepam (VALIUM) 5 MG tablet Take 1 tablet (5 mg total) by mouth every 8 (eight) hours as needed for anxiety. 60 tablet 0  . ibuprofen (ADVIL,MOTRIN) 200 MG tablet Take 200 mg by mouth every 6 (six) hours as needed for headache or moderate pain.    . IRON PO Take 45 mg by mouth every other day.    . levonorgestrel (MIRENA) 20 MCG/24HR IUD 1 each by Intrauterine route once.    . loratadine (CLARITIN) 10 MG tablet Take 10 mg by mouth daily as needed for allergies.    . Multiple Vitamins-Iron (MULTIVITAMINS WITH IRON) TABS tablet Take 1 tablet by mouth 2 (two) times daily. CHEWABLE    . nystatin cream (MYCOSTATIN) Apply 1 application topically 2 (two) times daily. Apply to affected area BID for up to 7 days. (Patient taking differently: Apply 1 application  topically 2 (two) times daily as needed for dry skin (rash). A) 30 g 2  . ondansetron (ZOFRAN) 4 MG tablet Take 1 tablet (4 mg total) by mouth every 8 (eight) hours as needed for nausea or vomiting. 30 tablet 0  . OVER THE COUNTER MEDICATION Take 500 mg by mouth 3 (three) times daily. Bariatric calcium 535m     . pantoprazole (PROTONIX) 40 MG tablet Take 40 mg by mouth daily as needed (reflux).   3   No current facility-administered medications for this visit.     REVIEW OF SYSTEMS:  _0  denotes positive finding, _1  denotes negative finding Vascular    Leg swelling    Cardiac    Chest pain or chest pressure:    Shortness of breath upon exertion:    Short of breath when lying flat:    Irregular heart rhythm:    Constitutional    Fever or chills:     PHYSICAL EXAM:   Vitals:   05/08/18 0833  BP: 109/64  Pulse: 80  Resp: 16  Temp: (!) 97.2 F (36.2 C)  TempSrc: Oral  SpO2: 100%  Weight: 221 lb (100.2 kg)  Height: _2  (1.727 m)    GENERAL: The patient is a well-nourished female, in no acute distress. The vital signs are documented above.  DATA:   No new data  MEDICAL ISSUES:   LASER ABLATION RIGHT GREAT SAPHENOUS VEIN GREATER THAN 20 STAB PHLEBECTOMIES: Patient was brought to the exam room.  The veins were marked with the patient standing.  The patient was then placed supine.  I examined the great saphenous vein and it appeared the best place to cannulate this was above the large cluster of varicose veins in the mid thigh.  The right leg was then prepped and draped in usual sterile fashion.  Under ultrasound guidance, after the skin was anesthetized, the great saphenous vein was cannulated with a micropuncture needle and a micropuncture sheath introduced over the wire.  The J-wire was then advanced to the proximal saphenous vein approximately 3 cm below the saphenofemoral junction.  The sheath was then advanced over the wire and the dilator and wire were removed.  The laser  fiber was then positioned at the end of the sheath and then the sheath slightly retracted.  The patient was placed in slight Trendelenburg.  After the skin was anesthetized with pumps of lidocaine tumescent anesthesia was administered the entire length of the vein.  With compression of the saphenofemoral junction laser ablation of the great saphenous vein was performed with a pullback rate of approximately 1 to 2 mm/s.  A total of 2126 J of energy were used.  Next tumescent with anesthesia was administered over all the marked varicose veins.  Using small stab incisions with an 11 blade the hook was used to retract the vein above the skin and then using a hemostat the veins were bluntly removed and pressure held for hemostasis.  No immediate complications were noted.  Sterile dressing was applied.  A pressure dressing was applied.  The patient tolerated the procedure well.  Deitra Mayo [facility] [phone]

## 2018-05-14 ENCOUNTER — Telehealth: Payer: Self-pay | Admitting: Adult Health

## 2018-05-14 NOTE — Telephone Encounter (Signed)
After reviewing disability papers completed 04/10/18, pt was not to return to work for 8 weeks from that [employer].  Advised pt that she may simply bring papers by the office to be completed.  Charyl Bigger, CMA

## 2018-05-14 NOTE — Telephone Encounter (Signed)
Patient was taken out of work for several weeks per [name] [name], but now work/disability comp is stating she needs to return to work Monday which is sooner than the timeline given by [name] [name] (and the patient is still wearing monitor and going through procedures and doesn't feel she is any condition to return to work either). She has some paperwork that will need to be filled out but wants me to send this message to see if she can drop it off or does she need an appt. Please advise

## 2018-05-15 ENCOUNTER — Ambulatory Visit (HOSPITAL_COMMUNITY)
Admission: RE | Admit: 2018-05-15 | Discharge: 2018-05-15 | Disposition: A | Discharge status: Home / Self Care | Payer: Managed Care, Other (non HMO) | Source: Ambulatory Visit | Attending: Vascular Surgery | Admitting: Vascular Surgery

## 2018-05-15 ENCOUNTER — Ambulatory Visit (INDEPENDENT_AMBULATORY_CARE_PROVIDER_SITE_OTHER): Payer: Self-pay | Admitting: Vascular Surgery

## 2018-05-15 ENCOUNTER — Encounter: Payer: Self-pay | Admitting: Vascular Surgery

## 2018-05-15 ENCOUNTER — Other Ambulatory Visit: Payer: Self-pay

## 2018-05-15 VITALS — BP 114/78 | HR 66 | Temp 97.7°F | Resp 16

## 2018-05-15 DIAGNOSIS — Z48812 Encounter for surgical aftercare following surgery on the circulatory system: Secondary | ICD-10-CM

## 2018-05-15 DIAGNOSIS — I83893 Varicose veins of bilateral lower extremities with other complications: Secondary | ICD-10-CM

## 2018-05-15 DIAGNOSIS — I8393 Asymptomatic varicose veins of bilateral lower extremities: Secondary | ICD-10-CM

## 2018-05-15 NOTE — Progress Notes (Signed)
Patient name: [name] [name] MRN: [id number] DOB: 19-Jan-1981 Sex: female  REASON FOR VISIT:   Follow-up after laser ablation of the right great saphenous vein and stab phlebectomies.  HPI:   [name] [name] is a pleasant 38 y.o. female who had previously undergone laser ablation of the left great saphenous vein with greater than 20 stab phlebectomies.  On 05/08/2018 she underwent laser ablation of the right great saphenous vein with greater than 20 stab phlebectomies.  She returns for a one-week follow-up visit.  She has no specific complaints and is doing well.  She states that her right leg feels much better now.  Current Outpatient Medications  Medication Sig Dispense Refill  . aspirin EC 81 MG EC tablet Take 1 tablet (81 mg total) by mouth daily.    . blood glucose meter kit and supplies KIT Dispense based on patient and insurance preference. Use up to four times daily as directed. (FOR ICD-9 250.00, 250.01). 1 each 0  . Calcium-Vitamin D-Vitamin K (CALCIUM SOFT CHEWS PO) Take by mouth daily.    . cyclobenzaprine (FLEXERIL) 10 MG tablet Take 1 tablet (10 mg total) by mouth 3 (three) times daily as needed for muscle spasms. 30 tablet 0  . diazepam (VALIUM) 5 MG tablet Take 1 tablet (5 mg total) by mouth every 8 (eight) hours as needed for anxiety. 60 tablet 0  . ibuprofen (ADVIL,MOTRIN) 200 MG tablet Take 200 mg by mouth every 6 (six) hours as needed for headache or moderate pain.    . IRON PO Take 45 mg by mouth every other day.    . levonorgestrel (MIRENA) 20 MCG/24HR IUD 1 each by Intrauterine route once.    . loratadine (CLARITIN) 10 MG tablet Take 10 mg by mouth daily as needed for allergies.    . Multiple Vitamins-Iron (MULTIVITAMINS WITH IRON) TABS tablet Take 1 tablet by mouth 2 (two) times daily. CHEWABLE    . nystatin cream (MYCOSTATIN) Apply 1 application topically 2 (two) times daily. Apply to affected area BID for up to 7 days. (Patient taking differently: Apply 1 application  topically 2 (two) times daily as needed for dry skin (rash). A) 30 g 2  . ondansetron (ZOFRAN) 4 MG tablet Take 1 tablet (4 mg total) by mouth every 8 (eight) hours as needed for nausea or vomiting. 30 tablet 0  . OVER THE COUNTER MEDICATION Take 500 mg by mouth 3 (three) times daily. Bariatric calcium '500mg'$      . pantoprazole (PROTONIX) 40 MG tablet Take 40 mg by mouth daily as needed (reflux).   3   No current facility-administered medications for this visit.     REVIEW OF SYSTEMS:  '[X]'$  denotes positive finding, '[ ]'$  denotes negative finding Vascular    Leg swelling    Cardiac    Chest pain or chest pressure:    Shortness of breath upon exertion:    Short of breath when lying flat:    Irregular heart rhythm:    Constitutional    Fever or chills:     PHYSICAL EXAM:   There were no vitals filed for this visit.  GENERAL: The patient is a well-nourished female, in no acute distress. The vital signs are documented above. CARDIOVASCULAR: There is a regular rate and rhythm. PULMONARY: There is good air exchange bilaterally without wheezing or rales. Her incisions are healing nicely. She has no significant leg swelling.  DATA:   VENOUS DUPLEX: I have independently interpreted her venous duplex scan today.  The patient has no evidence of deep venous thrombosis.  The right great saphenous vein was successfully closed up to within 2.66 cm of the saphenofemoral junction.  MEDICAL ISSUES:   STATUS POST LASER ABLATION RIGHT GREAT SAPHENOUS VEIN WITH STAB PHLEBECTOMIES: The patient is doing well status post staged bilateral laser ablations of the saphenous veins with stab phlebectomies.  We have again discussed the importance of some simple lifestyle changes to stay on top of her venous disease including leg elevation, compression stockings, exercise, and avoiding prolonged sitting and standing.  I will see her back as needed.  Deitra Mayo [facility]  [facility] [phone]

## 2018-05-28 NOTE — Progress Notes (Signed)
 Subjective:    [name], female    DOB: 08/02/1980, 37 y.o.   MRN: [id number]  HPI: 03/27/18 OV:  [name] [name] presents for hospital f/u. 03/22/18: "Pt was admitted to telemetry. Cardiac enzymes were cycled x 3 and remained negative. TSH was checked given bradycardia and was normal. Lipid panel showed elevated LDL at 139 mg/dL. Given her CP and bradycardia, she underwent coronary CTA to assess for CAD. She was premedicated w/ prednisone and benadryl given history of contrast allergy. Coronary CTA demonstrated coronary calcium score 46 which placed the patient at 99 percentile for age and sex matched control, normal coronary origin was right dominance, mild 0 to 25% disease in proximal to mid LAD, otherwise no significant coronary artery disease. 2D echo was also performed and demonstrated EF 55 to 60%, no regional wall motion normality.   Pt was last seen and examined by [name] [name] on 03/23/2018 who determined she was stable for discharge home.  According to the [name] [name], patient had dizziness upon standing.  There was also significant increase in the heart rate with standing as well.  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  It is also important for her to address her panic and anxiety.  She may ultimately benefit from medication such as Florinef or ProAmatine or abdominal binders and compression stocking.  She is encouraged to follow-up with her PCP"  She was previously on citalopam 20mg QD, d/c'd earlier this year due intolerance of med, re: "made me feel weird". She reports continued dizziness since d/c from hospital. Dizziness occurs at rest, with position change, she states "it's all the time". She reports low grade nausea without vomiting. She reports that her father was recently d/c'd with Parkinson's  She also states "I feel that my hands and arms want to draw up at  times" She also reports R Parietal HA and "feeling my heartbeat in my head": HA is constant, rated 3/10 and described as throbbing. She denies any recent migraine HAs She did not have any imaging of her head during recent hospitalization She also reports urinary frequency and dysuria. UA during hospitalization showed mod leuk's, she was never started on ABX  04/10/18 OV: [name] [name] is here for f/u:dizziness She was seen by Neurology 04/07/18, OV: 1) Unexplained bradycardia with venous insufficiency - incomplete RBBB.   A repeat orthostatic blood pressure : Supine-121/79 mmHg heart rate was 49 regular beats per minute, seated -118/83, 52 bpm.Standing heart rate went up to 61 blood pressure remained at 121/82.    Dizziness noted after standing for about 30 seconds, she did not notice dizziness while in motion, she can walk without a wide-based and she can turn with 3 steps there is no vertigo noted.  This finding seem to be related to a low heart rate response.  Dysautonomia? Postural bradycardia syndrome ?  2) Migraines seem to affect her- one sided head pain and numbness -always on the right, and she noted these since she has had near faiting and finally syncopal episodes.  PLan:  1) Hydration, use the compression stockings.  2) CPAP 3) migraine prevention - Excedrine prn OTC.  Follow-up with Neuro in 2/3 months  04/01/18- ED visit for R wrist pain r/t fall- she will f/u with Orthopedic Specialist   Today she reports slight reduction in dizziness. She will have varicose vein procedure next week She will have month   long Holter Study, estimated to begin at end of Dec She has been increasing water intake and walking daily  She reports brief, palpitations during periods of acute anxiety, denies CP with exertion. 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 Her husband travels for long periods for work 1-3 weeks at a time She was on Citalopram for depression but was eventually weaned off b/c "it made me too happy". She denies thoughts of harming herself/others She is agreeable to [facility] Referral  Patient Care Team    Relationship Specialty Notifications Start End  Esaw Grandchild, NP PCP - General Family Medicine  10/18/16     Patient Active Problem List   Diagnosis Date Noted  . GAD (generalized anxiety disorder) 06/02/2018  . Dysuria 03/27/2018  . Family history of Parkinsonism 03/27/2018  . Dizziness 03/27/2018  . Bradycardia 03/23/2018  . HLD (hyperlipidemia) 03/23/2018  . Coronary atherosclerosis 03/23/2018  . Chest pain 03/22/2018  . Varicose vein of leg 12/24/2017  . Venous reflux 12/24/2017  . Leukocytosis 09/29/2017  . Nausea 09/29/2017  . Anxiety 09/29/2017  . Pancreatitis 09/28/2017  . Dehydration 03/12/2017  . 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 Sleep apnea 01/09/2017  . Healthcare maintenance 10/18/2016  . LOW BACK PAIN, ACUTE 11/20/2006  . POLYCYSTIC OVARY 07/04/2006  . OBESITY, NOS 07/04/2006  . TOBACCO USE, QUIT 07/04/2006     Past Medical History:  Diagnosis Date  . Abnormal uterine bleeding   . Amenorrhea   . Anemia   . Anxiety    panic attacks  . Bradycardia   . Diabetes mellitus without complication ([place])    type 2 ---5/26 no loner in pre diabetic range since bariatric sx 6 months ago  . Dysmenorrhea   . GERD (gastroesophageal reflux disease)    occasional - diet controlled and tums prn  . Hyperlipidemia     no meds -diet controlled  . Infertility, female   . Insulin resistance   . Migraine without aura   . Missed abortion    x 2 - both resolved without surgery  . Obesity   . PCOS (polycystic ovarian syndrome)   . PCOS (polycystic ovarian syndrome)   . PONV (postoperative nausea and vomiting)    pe rpatient ; scopalamine patch very helpful   . Sleep apnea   . SVD (spontaneous vaginal delivery)    x 1  . Urinary incontinence      Past Surgical History:  Procedure Laterality Date  . CHOLECYSTECTOMY    . COLONOSCOPY  2017   polyps  . CYSTOSCOPY N/A 10/29/2016   Procedure: CYSTOSCOPY;  Surgeon: Salvadore Dom, MD;  Location: [facility];  Service: Gynecology;  Laterality: N/A;  . DILATATION & CURRETTAGE/HYSTEROSCOPY WITH RESECTOCOPE N/A 10/17/2012   Procedure: DILATATION & CURETTAGE/HYSTEROSCOPY WITH RESECTOCOPE; Possible Polypectomy, Possible Resectoscopic Myomectomy.;  Surgeon: Floyce Stakes. Pamala Hurry, MD;  Location: [facility];  Service: Gynecology;  Laterality: N/A;  1 hr.  Marland Kitchen DILATION AND CURETTAGE OF UTERUS    .  ENDOMETRIAL BIOPSY    . ENDOVENOUS ABLATION SAPHENOUS VEIN W/ LASER Right 05/08/2018   endovenous laser ablation right greater saphenous vein and stab phlebectomy > 20 incisions right leg by Deitra Mayo MD   . ESOPHAGOGASTRODUODENOSCOPY ENDOSCOPY  2017   Normal  . HYSTEROSCOPY W/D&C N/A 12/01/2013   Procedure: IUD Removal;  Surgeon: Floyce Stakes. Pamala Hurry, MD;  Location: [facility];  Service: Gynecology;  Laterality: N/A;  90 min.  . INTRAUTERINE DEVICE (IUD) INSERTION N/A 10/17/2012   Procedure: INTRAUTERINE DEVICE (IUD) INSERTION; Mirena;  Surgeon: Floyce Stakes. Pamala Hurry, MD;  Location: [facility];  Service: Gynecology;  Laterality: N/A;  . LAPAROSCOPIC GASTRIC SLEEVE RESECTION N/A 02/19/2017   Procedure: LAPAROSCOPIC GASTRIC SLEEVE RESECTION, UPPER ENDOSCOPY;  Surgeon: Johnathan Hausen, MD;  Location: [facility];  Service: General;  Laterality: N/A;  . LAPAROSCOPIC SALPINGO OOPHERECTOMY Left  10/29/2016   Procedure: LAPAROSCOPIC SALPINGO OOPHORECTOMY With anterior uterine biopsy;  Surgeon: Salvadore Dom, MD;  Location: [facility];  Service: Gynecology;  Laterality: Left;  . MOUTH SURGERY     wisdom teeth     Family History  Problem Relation Age of Onset  . Diabetes Father   . Thyroid disease Father   . Hypertension Father   . Diabetes Maternal Grandmother   . Heart disease Maternal Grandmother   . Diabetes Maternal Grandfather   . Heart disease Maternal Grandfather   . Stroke Maternal Grandfather   . Diabetes Paternal Grandmother   . Diabetes Paternal Grandfather   . Heart disease Paternal Grandfather   . Alcohol abuse Paternal Grandfather   . Diabetes Other   . Obesity Other   . Sleep apnea Other   . Hyperlipidemia Other   . Hypertension Other   . Cancer Other   . Heart disease Other   . Hypertension Mother      Social History   Substance and Sexual Activity  Drug Use No     Social History   Substance and Sexual Activity  Alcohol Use No  . Frequency: Never     Social History   Tobacco Use  Smoking Status Former Smoker  . Packs/day: 0.25  . Years: 19.00  . Pack years: 4.75  . Types: Cigarettes  . Last attempt to quit: 06/06/2016  . Years since quitting: 1.9  Smokeless Tobacco Never Used     Outpatient Encounter Medications as of 06/02/2018  Medication Sig  . aspirin EC 81 MG EC tablet Take 1 tablet (81 mg total) by mouth daily.  . blood glucose meter kit and supplies KIT Dispense based on patient and insurance preference. Use up to four times daily as directed. (FOR ICD-9 250.00, 250.01).  . Calcium-Vitamin D-Vitamin K (CALCIUM SOFT CHEWS PO) Take by mouth daily.  . cyclobenzaprine (FLEXERIL) 10 MG tablet Take 1 tablet (10 mg total) by mouth 3 (three) times daily as needed for muscle spasms.  . diazepam (VALIUM) 5 MG tablet Take 1 tablet (5 mg total) by mouth every 8 (eight) hours as needed for anxiety.  Marland Kitchen ibuprofen (ADVIL,MOTRIN) 200 MG  tablet Take 200 mg by mouth every 6 (six) hours as needed for headache or moderate pain.  . IRON PO Take 45 mg by mouth every other day.  . levonorgestrel (MIRENA) 20 MCG/24HR IUD 1 each by Intrauterine route once.  . loratadine (CLARITIN) 10 MG tablet Take 10 mg by mouth daily as needed for allergies.  . Multiple Vitamins-Iron (MULTIVITAMINS WITH IRON) TABS tablet Take 1 tablet by mouth 2 (two) times daily. CHEWABLE  .  nystatin cream (MYCOSTATIN) Apply 1 application topically 2 (two) times daily. Apply to affected area BID for up to 7 days. (Patient taking differently: Apply 1 application topically 2 (two) times daily as needed for dry skin (rash). A)  . ondansetron (ZOFRAN) 4 MG tablet Take 1 tablet (4 mg total) by mouth every 8 (eight) hours as needed for nausea or vomiting.  Marland Kitchen OVER THE COUNTER MEDICATION Take 500 mg by mouth 3 (three) times daily. Bariatric calcium '500mg'$    . pantoprazole (PROTONIX) 40 MG tablet Take 40 mg by mouth daily as needed (reflux).   Marland Kitchen FLUoxetine (PROZAC) 10 MG tablet Take 1 tablet (10 mg total) by mouth daily.   No facility-administered encounter medications on file as of 06/02/2018.     Allergies: Iohexol  Body mass index is 34.9 kg/m.  Blood pressure 102/70, pulse 73, temperature 98.9 F (37.2 C), temperature source Oral, height '5\' 8"'$  (1.727 m), weight 229 lb 8 oz (104.1 kg), SpO2 99 %.  Review of Systems  Constitutional: Positive for fatigue. Negative for activity change, appetite change, chills, diaphoresis, fever and unexpected weight change.  Eyes: Negative for visual disturbance.  Respiratory: Negative for cough, chest tightness, shortness of breath, wheezing and stridor.   Cardiovascular: Negative for chest pain, palpitations and leg swelling.  Gastrointestinal: Negative for abdominal distention, anal bleeding, blood in stool, constipation, diarrhea, nausea and vomiting.  Endocrine: Negative for cold intolerance, heat intolerance, polydipsia,  polyphagia and polyuria.  Skin: Negative for color change, pallor, rash and wound.  Neurological: Positive for dizziness and headaches.  Hematological: Does not bruise/bleed easily.  Psychiatric/Behavioral: Positive for sleep disturbance. Negative for agitation, behavioral problems, confusion, decreased concentration, dysphoric mood, hallucinations, self-injury and suicidal ideas. The patient is nervous/anxious. The patient is not hyperactive.        Objective:   Physical Exam Vitals signs and nursing note reviewed.  Constitutional:      General: She is not in acute distress.    Appearance: She is well-developed. She is not diaphoretic.  HENT:     Head: Normocephalic and atraumatic.     Right Ear: External ear normal.     Left Ear: External ear normal.     Nose: Nose normal.  Eyes:     Conjunctiva/sclera: Conjunctivae normal.     Pupils: Pupils are equal, round, and reactive to light.  Cardiovascular:     Rate and Rhythm: Normal rate and regular rhythm.     Heart sounds: Normal heart sounds. No murmur.  Pulmonary:     Effort: Pulmonary effort is normal. No respiratory distress.     Breath sounds: Normal breath sounds. No stridor. No wheezing, rhonchi or rales.  Chest:     Chest wall: No tenderness.  Musculoskeletal:     Right wrist: She exhibits swelling.  Skin:    General: Skin is warm and dry.     Capillary Refill: Capillary refill takes less than 2 seconds.     Coloration: Skin is not pale.     Findings: No erythema or rash.  Neurological:     Mental Status: She is alert and oriented to person, place, and time.     Motor: No tremor.     Deep Tendon Reflexes: Reflexes normal.     Comments:    Psychiatric:        Behavior: Behavior normal.        Thought Content: Thought content normal.        Judgment: Judgment normal.  Assessment & Plan:   1. GAD (generalized anxiety disorder)   2. Dizziness   3. Healthcare maintenance     Dizziness BP stable 30 Day  Holter study reassuring Slowly increase low-impact exercise- stationary bike, yoga, stretching. Remain well hydrate and follow heart healthy diet. Follow-up with Neurology as directed. Follow-up here in 4 weeks.  GAD (generalized anxiety disorder) Continue all medications as directed, with one new addition- Fluoxetine (Prozac) '10mg'$  once daily. Referral to Psychology placed. Follow-up here in 4 weeks.  Healthcare maintenance Continue all medications as directed, with one new addition- Fluoxetine (Prozac) '10mg'$  once daily. Referral to Psychology placed. Slowly increase low-impact exercise- stationary bike, yoga, stretching. Remain well hydrate and follow heart healthy diet. Follow-up with Neurology as directed. Follow-up here in 4 weeks.   FOLLOW-UP:  Return in about 4 weeks (around 06/30/2018) for Regular Follow Up, Evaluate Medication Effectiveness, General Anxiety Disorder.

## 2018-05-29 ENCOUNTER — Encounter: Payer: Self-pay | Admitting: Neurology

## 2018-06-02 ENCOUNTER — Encounter: Payer: Self-pay | Admitting: Adult Health

## 2018-06-02 ENCOUNTER — Ambulatory Visit (INDEPENDENT_AMBULATORY_CARE_PROVIDER_SITE_OTHER): Payer: Managed Care, Other (non HMO) | Admitting: Obstetrics and Gynecology

## 2018-06-02 ENCOUNTER — Encounter: Payer: Self-pay | Admitting: Obstetrics and Gynecology

## 2018-06-02 ENCOUNTER — Other Ambulatory Visit: Payer: Self-pay

## 2018-06-02 ENCOUNTER — Ambulatory Visit (INDEPENDENT_AMBULATORY_CARE_PROVIDER_SITE_OTHER): Payer: Managed Care, Other (non HMO) | Admitting: Adult Health

## 2018-06-02 VITALS — BP 110/70 | HR 68 | Resp 16 | Ht 67.75 in | Wt 228.0 lb

## 2018-06-02 VITALS — BP 102/70 | HR 73 | Temp 98.9°F | Ht 68.0 in | Wt 229.5 lb

## 2018-06-02 DIAGNOSIS — F411 Generalized anxiety disorder: Secondary | ICD-10-CM

## 2018-06-02 DIAGNOSIS — Z30431 Encounter for routine checking of intrauterine contraceptive device: Secondary | ICD-10-CM

## 2018-06-02 DIAGNOSIS — R42 Dizziness and giddiness: Secondary | ICD-10-CM | POA: Diagnosis not present

## 2018-06-02 DIAGNOSIS — N760 Acute vaginitis: Secondary | ICD-10-CM

## 2018-06-02 DIAGNOSIS — Z0000 Encounter for general adult medical examination without abnormal findings: Secondary | ICD-10-CM | POA: Diagnosis not present

## 2018-06-02 DIAGNOSIS — E282 Polycystic ovarian syndrome: Secondary | ICD-10-CM

## 2018-06-02 DIAGNOSIS — L680 Hirsutism: Secondary | ICD-10-CM

## 2018-06-02 DIAGNOSIS — Z01419 Encounter for gynecological examination (general) (routine) without abnormal findings: Secondary | ICD-10-CM

## 2018-06-02 DIAGNOSIS — N3946 Mixed incontinence: Secondary | ICD-10-CM

## 2018-06-02 MED ORDER — FLUOXETINE HCL 10 MG PO TABS: 10.0000 mg | ORAL_TABLET | Freq: Every day | ORAL | 1 refills | Status: DC

## 2018-06-02 MED FILL — FLUoxetine HCL 10 MG TABS: 10 | 30 days supply | Qty: 30 | Fill #0

## 2018-06-02 NOTE — Patient Instructions (Addendum)
EXERCISE AND DIET:  We recommended that you start or continue a regular exercise program for good health. Regular exercise means any activity that makes your heart beat faster and makes you sweat.  We recommend exercising at least 30 minutes per day at least 3 days a week, preferably 4 or 5.  We also recommend a diet low in fat and sugar.  Inactivity, poor dietary choices and obesity can cause diabetes, heart attack, stroke, and kidney damage, among others.    ALCOHOL AND SMOKING:  Women should limit their alcohol intake to no more than 7 drinks/beers/glasses of wine (combined, not each!) per week. Moderation of alcohol intake to this level decreases your risk of breast cancer and liver damage. And of course, no recreational drugs are part of a healthy lifestyle.  And absolutely no smoking or even second hand smoke. Most people know smoking can cause heart and lung diseases, but did you know it also contributes to weakening of your bones? Aging of your skin?  Yellowing of your teeth and nails?  CALCIUM AND VITAMIN D:  Adequate intake of calcium and Vitamin D are recommended.  The recommendations for exact amounts of these supplements seem to change often, but generally speaking 1,000 mg of calcium (between diet and supplement) and 800 units of Vitamin D per day seems prudent. Certain women may benefit from higher intake of Vitamin D.  If you are among these women, your doctor will have told you during your visit.    PAP SMEARS:  Pap smears, to check for cervical cancer or precancers,  have traditionally been done yearly, although recent scientific advances have shown that most women can have pap smears less often.  However, every woman still should have a physical exam from her gynecologist every year. It will include a breast check, inspection of the vulva and vagina to check for abnormal growths or skin changes, a visual exam of the cervix, and then an exam to evaluate the size and shape of the uterus and  ovaries.  And after 38 years of age, a rectal exam is indicated to check for rectal cancers. We will also provide age appropriate advice regarding health maintenance, like when you should have certain vaccines, screening for sexually transmitted diseases, bone density testing, colonoscopy, mammograms, etc.   MAMMOGRAMS:  All women over 40 years old should have a yearly mammogram. Many facilities now offer a "3D" mammogram, which may cost around $50 extra out of pocket. If possible,  we recommend you accept the option to have the 3D mammogram performed.  It both reduces the number of women who will be called back for extra views which then turn out to be normal, and it is better than the routine mammogram at detecting truly abnormal areas.    COLON CANCER SCREENING: Now recommend starting at age 45. At this time colonoscopy is not covered for routine screening until 50. There are take home tests that can be done between 45-49.   COLONOSCOPY:  Colonoscopy to screen for colon cancer is recommended for all women at age 50.  We know, you hate the idea of the prep.  We agree, BUT, having colon cancer and not knowing it is worse!!  Colon cancer so often starts as a polyp that can be seen and removed at colonscopy, which can quite literally save your life!  And if your first colonoscopy is normal and you have no family history of colon cancer, most women don't have to have it again for   10 years.  Once every ten years, you can do something that may end up saving your life, right?  We will be happy to help you get it scheduled when you are ready.  Be sure to check your insurance coverage so you understand how much it will cost.  It may be covered as a preventative service at no cost, but you should check your particular policy.      Breast Self-Awareness Breast self-awareness means being familiar with how your breasts look and feel. It involves checking your breasts regularly and reporting any changes to your  health care provider. Practicing breast self-awareness is important. A change in your breasts can be a sign of a serious medical problem. Being familiar with how your breasts look and feel allows you to find any problems early, when treatment is more likely to be successful. All women should practice breast self-awareness, including women who have had breast implants. How to do a breast self-exam One way to learn what is normal for your breasts and whether your breasts are changing is to do a breast self-exam. To do a breast self-exam: Look for Changes  1. Remove all the clothing above your waist. 2. Stand in front of a mirror in a room with good lighting. 3. Put your hands on your hips. 4. Push your hands firmly downward. 5. Compare your breasts in the mirror. Look for differences between them (asymmetry), such as: ? Differences in shape. ? Differences in size. ? Puckers, dips, and bumps in one breast and not the other. 6. Look at each breast for changes in your skin, such as: ? Redness. ? Scaly areas. 7. Look for changes in your nipples, such as: ? Discharge. ? Bleeding. ? Dimpling. ? Redness. ? A change in position. Feel for Changes Carefully feel your breasts for lumps and changes. It is best to do this while lying on your back on the floor and again while sitting or standing in the shower or tub with soapy water on your skin. Feel each breast in the following way:  Place the arm on the side of the breast you are examining above your head.  Feel your breast with the other hand.  Start in the nipple area and make  inch (2 cm) overlapping circles to feel your breast. Use the pads of your three middle fingers to do this. Apply light pressure, then medium pressure, then firm pressure. The light pressure will allow you to feel the tissue closest to the skin. The medium pressure will allow you to feel the tissue that is a little deeper. The firm pressure will allow you to feel the tissue  close to the ribs.  Continue the overlapping circles, moving downward over the breast until you feel your ribs below your breast.  Move one finger-width toward the center of the body. Continue to use the  inch (2 cm) overlapping circles to feel your breast as you move slowly up toward your collarbone.  Continue the up and down exam using all three pressures until you reach your armpit.  Write Down What You Find  Write down what is normal for each breast and any changes that you find. Keep a written record with breast changes or normal findings for each breast. By writing this information down, you do not need to depend only on memory for size, tenderness, or location. Write down where you are in your menstrual cycle, if you are still menstruating. If you are having trouble noticing differences   in your breasts, do not get discouraged. With time you will become more familiar with the variations in your breasts and more comfortable with the exam. How often should I examine my breasts? Examine your breasts every month. If you are breastfeeding, the best time to examine your breasts is after a feeding or after using a breast pump. If you menstruate, the best time to examine your breasts is 5-7 days after your period is over. During your period, your breasts are lumpier, and it may be more difficult to notice changes. When should I see my health care provider? See your health care provider if you notice:  A change in shape or size of your breasts or nipples.  A change in the skin of your breast or nipples, such as a reddened or scaly area.  Unusual discharge from your nipples.  A lump or thick area that was not there before.  Pain in your breasts.  Anything that concerns you.   Kegel Exercises Kegel exercises help strengthen the muscles that support the rectum, vagina, small intestine, bladder, and uterus. Doing Kegel exercises can help:  Improve bladder and bowel control.  Improve  sexual response.  Reduce problems and discomfort during pregnancy. Kegel exercises involve squeezing your pelvic floor muscles, which are the same muscles you squeeze when you try to stop the flow of urine. The exercises can be done while sitting, standing, or lying down, but it is best to vary your position. Exercises 1. Squeeze your pelvic floor muscles tight. You should feel a tight lift in your rectal area. If you are a female, you should also feel a tightness in your vaginal area. Keep your stomach, buttocks, and legs relaxed. 2. Hold the muscles tight for up to 10 seconds. 3. Relax your muscles. Repeat this exercise 50 times a day or as many times as told by your health care provider. Continue to do this exercise for at least 4-6 weeks or for as long as told by your health care provider. This information is not intended to replace advice given to you by your health care provider. Make sure you discuss any questions you have with your health care provider. Document Released: 04/09/2012 Document Revised: 09/03/2016 Document Reviewed: 03/13/2015 Elsevier Interactive Patient Education  2019 [employer].

## 2018-06-02 NOTE — Progress Notes (Signed)
38 y.o. [id number] Married White or Caucasian Not Hispanic or Latino female here for annual exam.   She is s/p laparoscopic LOA, LSO and chromopertubation in 6/18. Pathology with cystadenoma and endometriosis.  She had a sleeve gastrectomy in 10/18. She lost 110 lbs, she has gained 12 back because she couldn't exercise.  In the last year she was admitted for pancreatitis (etiology unclear) and chest pain (negative for MI). She has had episodes of syncope. She had a 25% blockage in one artery. Had a right bundle branch block, some PVC. She had a one month cardiac monitor.  Started on prozac for anxiety today. Under lots of stress. 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 Son was in a car accident, he is okay, was very stressful. Husband is traveling a lot.  She has been out of work since November, plans to go back.  She has a mirena IUD, placed in 12/17. She has a h/o PCOS, she notices an increase in hair growth on her chest and chin.  She c/o an absent libido and difficulty achieving orgasm.   She has noticed a yellow vaginal d/c in the last 1-2 days. She also notices a vulvar burning, feels like a tear.   Last check she wasn't even pre-diabetic.     No LMP recorded. (Menstrual status: IUD).          Sexually active: Yes.    The current method of family planning is IUD.    Exercising: Yes.    gym Smoker:  no  Health Maintenance: Pap:  03-14-16 WNL NEG HR HPV History of abnormal Pap:  Yes, 2004--repeat PAP normal TDaP:  11/18/13 Gardasil: No   reports that she quit smoking about 1 years ago. Her smoking use included cigarettes. She has a 4.75 pack-year smoking history. She has never used smokeless tobacco. She reports that she does not drink alcohol or use drugs.  Past Medical History:  Diagnosis Date  . Abnormal uterine bleeding   . Amenorrhea   . Anemia   . Anxiety    panic attacks  . Bradycardia   . Diabetes mellitus without complication ([place])    type 2 ---5/26 no loner in pre  diabetic range since bariatric sx 6 months ago  . Dysmenorrhea   . GERD (gastroesophageal reflux disease)    occasional - diet controlled and tums prn  . Hyperlipidemia    no meds -diet controlled  . Infertility, female   . Insulin resistance   . Migraine without aura   . Missed abortion    x 2 - both resolved without surgery  . Obesity   . PCOS (polycystic ovarian syndrome)   . PCOS (polycystic ovarian syndrome)   . PONV (postoperative nausea and vomiting)    pe rpatient ; scopalamine patch very helpful   . Sleep apnea   . SVD (spontaneous vaginal delivery)    x 1  . Urinary incontinence     Past Surgical History:  Procedure Laterality Date  . CHOLECYSTECTOMY    . COLONOSCOPY  2017   polyps  . CYSTOSCOPY N/A 10/29/2016   Procedure: CYSTOSCOPY;  Surgeon: Salvadore Dom, MD;  Location: [facility];  Service: Gynecology;  Laterality: N/A;  . DILATATION & CURRETTAGE/HYSTEROSCOPY WITH RESECTOCOPE N/A 10/17/2012   Procedure: DILATATION & CURETTAGE/HYSTEROSCOPY WITH RESECTOCOPE; Possible Polypectomy, Possible Resectoscopic Myomectomy.;  Surgeon: Floyce Stakes. Pamala Hurry, MD;  Location: [facility];  Service: Gynecology;  Laterality: N/A;  1 hr.  Marland Kitchen DILATION AND CURETTAGE OF UTERUS    .  ENDOMETRIAL BIOPSY    . ENDOVENOUS ABLATION SAPHENOUS VEIN W/ LASER Right 05/08/2018   endovenous laser ablation right greater saphenous vein and stab phlebectomy > 20 incisions right leg by Deitra Mayo MD   . ESOPHAGOGASTRODUODENOSCOPY ENDOSCOPY  2017   Normal  . HYSTEROSCOPY W/D&C N/A 12/01/2013   Procedure: IUD Removal;  Surgeon: Floyce Stakes. Pamala Hurry, MD;  Location: [facility];  Service: Gynecology;  Laterality: N/A;  90 min.  . INTRAUTERINE DEVICE (IUD) INSERTION N/A 10/17/2012   Procedure: INTRAUTERINE DEVICE (IUD) INSERTION; Mirena;  Surgeon: Floyce Stakes. Pamala Hurry, MD;  Location: [facility];  Service: Gynecology;  Laterality: N/A;  . LAPAROSCOPIC GASTRIC SLEEVE RESECTION N/A 02/19/2017   Procedure: LAPAROSCOPIC  GASTRIC SLEEVE RESECTION, UPPER ENDOSCOPY;  Surgeon: Johnathan Hausen, MD;  Location: [facility];  Service: General;  Laterality: N/A;  . LAPAROSCOPIC SALPINGO OOPHERECTOMY Left 10/29/2016   Procedure: LAPAROSCOPIC SALPINGO OOPHORECTOMY With anterior uterine biopsy;  Surgeon: Salvadore Dom, MD;  Location: [facility];  Service: Gynecology;  Laterality: Left;  . MOUTH SURGERY     wisdom teeth    Current Outpatient Medications  Medication Sig Dispense Refill  . aspirin EC 81 MG EC tablet Take 1 tablet (81 mg total) by mouth daily.    . blood glucose meter kit and supplies KIT Dispense based on patient and insurance preference. Use up to four times daily as directed. (FOR ICD-9 250.00, 250.01). 1 each 0  . Calcium-Vitamin D-Vitamin K (CALCIUM SOFT CHEWS PO) Take by mouth daily.    . diazepam (VALIUM) 5 MG tablet Take 1 tablet (5 mg total) by mouth every 8 (eight) hours as needed for anxiety. 60 tablet 0  . FLUoxetine (PROZAC) 10 MG tablet Take 1 tablet (10 mg total) by mouth daily. 30 tablet 1  . ibuprofen (ADVIL,MOTRIN) 200 MG tablet Take 200 mg by mouth every 6 (six) hours as needed for headache or moderate pain.    . IRON PO Take 45 mg by mouth every other day.    . levonorgestrel (MIRENA) 20 MCG/24HR IUD 1 each by Intrauterine route once.    . loratadine (CLARITIN) 10 MG tablet Take 10 mg by mouth daily as needed for allergies.    . Multiple Vitamins-Iron (MULTIVITAMINS WITH IRON) TABS tablet Take 1 tablet by mouth 2 (two) times daily. CHEWABLE    . nystatin cream (MYCOSTATIN) Apply 1 application topically 2 (two) times daily. Apply to affected area BID for up to 7 days. (Patient taking differently: Apply 1 application topically 2 (two) times daily as needed for dry skin (rash). A) 30 g 2  . ondansetron (ZOFRAN) 4 MG tablet Take 1 tablet (4 mg total) by mouth every 8 (eight) hours as needed for nausea or vomiting. 30 tablet 0  . OVER THE COUNTER MEDICATION Take 500 mg by mouth 3 (three) times  daily. Bariatric calcium '500mg'$      . cyclobenzaprine (FLEXERIL) 10 MG tablet Take 1 tablet (10 mg total) by mouth 3 (three) times daily as needed for muscle spasms. (Patient not taking: Reported on 06/02/2018) 30 tablet 0  . pantoprazole (PROTONIX) 40 MG tablet Take 40 mg by mouth daily as needed (reflux).   3   No current facility-administered medications for this visit.     Family History  Problem Relation Age of Onset  . Diabetes Father   . Thyroid disease Father   . Hypertension Father   . Diabetes Maternal Grandmother   . Heart disease Maternal Grandmother   . Diabetes Maternal Grandfather   .  Heart disease Maternal Grandfather   . Stroke Maternal Grandfather   . Diabetes Paternal Grandmother   . Diabetes Paternal Grandfather   . Heart disease Paternal Grandfather   . Alcohol abuse Paternal Grandfather   . Diabetes Other   . Obesity Other   . Sleep apnea Other   . Hyperlipidemia Other   . Hypertension Other   . Cancer Other   . Heart disease Other   . Hypertension Mother     Review of Systems  Constitutional: Negative.   HENT: Negative.   Eyes: Negative.   Respiratory: Negative.   Cardiovascular: Negative.   Gastrointestinal: Negative.   Endocrine: Negative.   Genitourinary: Negative.   Musculoskeletal: Negative.   Skin: Negative.   Allergic/Immunologic: Negative.   Neurological: Negative.   Hematological: Negative.   Psychiatric/Behavioral: Negative.   She has extra skin and has breakdown.  She has occasional GSI, occasionally will leak a little with sex.  She has some urge incontinence, small amounts, 2 x a week.  Doesn't drink caffeine.   Exam:   BP 110/70 (BP Location: Right Arm, Patient Position: Sitting, Cuff Size: Large)   Pulse 68   Resp 16   Ht 5' 7.75" (1.721 m)   Wt 228 lb (103.4 kg)   BMI 34.92 kg/m   Weight change: '@WEIGHTCHANGE'$ @ Height:   Height: 5' 7.75" (172.1 cm)  Ht Readings from Last 3 Encounters:  06/02/18 5' 7.75" (1.721 m)   06/02/18 '5\' 8"'$  (1.727 m)  05/08/18 '5\' 8"'$  (1.727 m)    General appearance: alert, cooperative and appears stated age Head: Normocephalic, without obvious abnormality, atraumatic Neck: no adenopathy, supple, symmetrical, trachea midline and thyroid normal to inspection and palpation Lungs: clear to auscultation bilaterally Cardiovascular: regular rate and rhythm Breasts: normal appearance, no masses or tenderness Abdomen: soft, non-tender; non distended,  no masses,  no organomegaly Extremities: extremities normal, atraumatic, no cyanosis or edema Skin: Skin color, texture, turgor normal. No rashes or lesions Lymph nodes: Cervical, supraclavicular, and axillary nodes normal. No abnormal inguinal nodes palpated Neurologic: Grossly normal   Pelvic: External genitalia:  no lesions, cut noted above the clitoris, mild erythema              Urethra:  normal appearing urethra with no masses, tenderness or lesions              Bartholins and Skenes: normal                 Vagina: normal appearing vagina with normal color and discharge, no lesions              Cervix: no lesions and IUD string 3-4 cm               Bimanual Exam:  Uterus:  no masses or tenderness              Adnexa: no mass, fullness, tenderness               Rectovaginal: Confirms               Anus:  normal sphincter tone, no lesions  Chaperone was present for exam.  A:  Well Woman with normal exam  IUD check  Hirsutism, worsening slightly  Mixed incontinence  Vulvitis  P:   Pap next year  Discussed breast self exam  Discussed calcium and vit D intake  Reviewed gardasil information  Send urine for ua, c&s  Kegel information given  Discussed the treatment options for  incontinence

## 2018-06-02 NOTE — Patient Instructions (Addendum)
Generalized Anxiety Disorder, Adult Generalized anxiety disorder (GAD) is a mental health disorder. People with this condition constantly worry about everyday events. Unlike normal anxiety, worry related to GAD is not triggered by a specific event. These worries also do not fade or get better with time. GAD interferes with life functions, including relationships, work, and school. GAD can vary from mild to severe. People with severe GAD can have intense waves of anxiety with physical symptoms (panic attacks). What are the causes? The exact cause of GAD is not known. What increases the risk? This condition is more likely to develop in:  Women.  People who have a family history of anxiety disorders.  People who are very shy.  People who experience very stressful life events, such as the death of a loved one.  People who have a very stressful family environment. What are the signs or symptoms? People with GAD often worry excessively about many things in their lives, such as their health and family. They may also be overly concerned about:  Doing well at work.  Being on time.  Natural disasters.  Friendships. Physical symptoms of GAD include:  Fatigue.  Muscle tension or having muscle twitches.  Trembling or feeling shaky.  Being easily startled.  Feeling like your heart is pounding or racing.  Feeling out of breath or like you cannot take a deep breath.  Having trouble falling asleep or staying asleep.  Sweating.  Nausea, diarrhea, or irritable bowel syndrome (IBS).  Headaches.  Trouble concentrating or remembering facts.  Restlessness.  Irritability. How is this diagnosed? Your health care provider can diagnose GAD based on your symptoms and medical history. You will also have a physical exam. The health care provider will ask specific questions about your symptoms, including how severe they are, when they started, and if they come and go. Your health care  provider may ask you about your use of alcohol or drugs, including prescription medicines. Your health care provider may refer you to a mental health specialist for further evaluation. Your health care provider will do a thorough examination and may perform additional tests to rule out other possible causes of your symptoms. To be diagnosed with GAD, a person must have anxiety that:  Is out of his or her control.  Affects several different aspects of his or her life, such as work and relationships.  Causes distress that makes him or her unable to take part in normal activities.  Includes at least three physical symptoms of GAD, such as restlessness, fatigue, trouble concentrating, irritability, muscle tension, or sleep problems. Before your health care provider can confirm a diagnosis of GAD, these symptoms must be present more days than they are not, and they must last for six months or longer. How is this treated? The following therapies are usually used to treat GAD:  Medicine. Antidepressant medicine is usually prescribed for long-term daily control. Antianxiety medicines may be added in severe cases, especially when panic attacks occur.  Talk therapy (psychotherapy). Certain types of talk therapy can be helpful in treating GAD by providing support, education, and guidance. Options include: ? Cognitive behavioral therapy (CBT). People learn coping skills and techniques to ease their anxiety. They learn to identify unrealistic or negative thoughts and behaviors and to replace them with positive ones. ? Acceptance and commitment therapy (ACT). This treatment teaches people how to be mindful as a way to cope with unwanted thoughts and feelings. ? Biofeedback. This process trains you to manage your body's response (  physiological response) through breathing techniques and relaxation methods. You will work with a therapist while machines are used to monitor your physical symptoms.  Stress  management techniques. These include yoga, meditation, and exercise. A mental health specialist can help determine which treatment is best for you. Some people see improvement with one type of therapy. However, other people require a combination of therapies. Follow these instructions at home:  Take over-the-counter and prescription medicines only as told by your health care provider.  Try to maintain a normal routine.  Try to anticipate stressful situations and allow extra time to manage them.  Practice any stress management or self-calming techniques as taught by your health care provider.  Do not punish yourself for setbacks or for not making progress.  Try to recognize your accomplishments, even if they are small.  Keep all follow-up visits as told by your health care provider. This is important. Contact a health care provider if:  Your symptoms do not get better.  Your symptoms get worse.  You have signs of depression, such as: ? A persistently sad, cranky, or irritable mood. ? Loss of enjoyment in activities that used to bring you joy. ? Change in weight or eating. ? Changes in sleeping habits. ? Avoiding friends or family members. ? Loss of energy for normal tasks. ? Feelings of guilt or worthlessness. Get help right away if:  You have serious thoughts about hurting yourself or others. If you ever feel like you may hurt yourself or others, or have thoughts about taking your own life, get help right away. You can go to your nearest emergency department or call:  Your local emergency services (911 in the [place]  A suicide crisis helpline, such as the [place] at 607-773-9542. This is open 24 hours a day. Summary  Generalized anxiety disorder (GAD) is a mental health disorder that involves worry that is not triggered by a specific event.  People with GAD often worry excessively about many things in their lives, such as their health and  family.  GAD may cause physical symptoms such as restlessness, trouble concentrating, sleep problems, frequent sweating, nausea, diarrhea, headaches, and trembling or muscle twitching.  A mental health specialist can help determine which treatment is best for you. Some people see improvement with one type of therapy. However, other people require a combination of therapies. This information is not intended to replace advice given to you by your health care provider. Make sure you discuss any questions you have with your health care provider. Document Released: 08/18/2012 Document Revised: 03/13/2016 Document Reviewed: 03/13/2016 Elsevier Interactive Patient Education  2019 [place] Stress Reduction Mindfulness-based stress reduction (MBSR) is a program that helps people learn to practice mindfulness. Mindfulness is the practice of intentionally paying attention to the present moment. It can be learned and practiced through techniques such as education, breathing exercises, meditation, and yoga. MBSR includes several mindfulness techniques in one program. MBSR works best when you understand the treatment, are willing to try new things, and can commit to spending time practicing what you learn. MBSR training may include learning about:  How your emotions, thoughts, and reactions affect your body.  New ways to respond to things that cause negative thoughts to start (triggers).  How to notice your thoughts and let go of them.  Practicing awareness of everyday things that you normally do without thinking.  The techniques and goals of different types of meditation. What are the benefits of MBSR?  MBSR can have many benefits, which include helping you to:  Develop self-awareness. This refers to knowing and understanding yourself.  Learn skills and attitudes that help you to participate in your own health care.  Learn new ways to care for yourself.  Be more accepting  about how things are, and let things go.  Be less judgmental and approach things with an open mind.  Be patient with yourself and trust yourself more. MBSR has also been shown to:  Reduce negative emotions, such as depression and anxiety.  Improve memory and focus.  Change how you sense and approach pain.  Boost your body's ability to fight infections.  Help you connect better with other people.  Improve your sense of well-being. Follow these instructions at home:   Find a local in-person or online MBSR program.  Set aside some time regularly for mindfulness practice.  Find a mindfulness practice that works best for you. This may include one or more of the following: ? Meditation. Meditation involves focusing your mind on a certain thought or activity. ? Breathing awareness exercises. These help you to stay present by focusing on your breath. ? Body scan. For this practice, you lie down and pay attention to each part of your body from head to toe. You can identify tension and soreness and intentionally relax parts of your body. ? Yoga. Yoga involves stretching and breathing, and it can improve your ability to move and be flexible. It can also provide an experience of testing your body's limits, which can help you release stress. ? Mindful eating. This way of eating involves focusing on the taste, texture, color, and smell of each bite of food. Because this slows down eating and helps you feel full sooner, it can be an important part of a weight-loss plan.  Find a podcast or recording that provides guidance for breathing awareness, body scan, or meditation exercises. You can listen to these any time when you have a free moment to rest without distractions.  Follow your treatment plan as told by your health care provider. This may include taking regular medicines and making changes to your diet or lifestyle as recommended. How to practice mindfulness To do a basic awareness  exercise:  Find a comfortable place to sit.  Pay attention to the present moment. Observe your thoughts, feelings, and surroundings just as they are.  Avoid placing judgment on yourself, your feelings, or your surroundings. Make note of any judgment that comes up, and let it go.  Your mind may wander, and that is okay. Make note of when your thoughts drift, and return your attention to the present moment. To do basic mindfulness meditation:  Find a comfortable place to sit. This may include a stable chair or a firm floor cushion. ? Sit upright with your back straight. Let your arms fall next to your side with your hands resting on your legs. ? If sitting in a chair, rest your feet flat on the floor. ? If sitting on a cushion, cross your legs in front of you.  Keep your head in a neutral position with your chin dropped slightly. Relax your jaw and rest the tip of your tongue on the roof of your mouth. Drop your gaze to the floor. You can close your eyes if you like.  Breathe normally and pay attention to your breath. Feel the air moving in and out of your nose. Feel your belly expanding and relaxing with each breath.  Your mind may wander,  and that is okay. Make note of when your thoughts drift, and return your attention to your breath.  Avoid placing judgment on yourself, your feelings, or your surroundings. Make note of any judgment or feelings that come up, let them go, and bring your attention back to your breath.  When you are ready, lift your gaze or open your eyes. Pay attention to how your body feels after the meditation. Where to find more information You can find more information about MBSR from:  Your health care provider.  Community-based meditation centers or programs.  Programs offered near you. Summary  Mindfulness-based stress reduction (MBSR) is a program that teaches you how to intentionally pay attention to the present moment. It is used with other treatments to  help you cope better with daily stress, emotions, and pain.  MBSR focuses on developing self-awareness, which allows you to respond to life stress without judgment or negative emotions.  MBSR programs may involve learning different mindfulness practices, such as breathing exercises, meditation, yoga, body scan, or mindful eating. Find a mindfulness practice that works best for you, and set aside time for it on a regular basis. This information is not intended to replace advice given to you by your health care provider. Make sure you discuss any questions you have with your health care provider. Document Released: 08/30/2016 Document Revised: 08/30/2016 Document Reviewed: 08/30/2016 Elsevier Interactive Patient Education  2019 [place] all medications as directed, with one new addition- Fluoxetine (Prozac) 10mg  once daily. Referral to Psychology placed. Slowly increase low-impact exercise- stationary bike, yoga, stretching. Remain well hydrate and follow heart healthy diet. Follow-up with Neurology as directed. Follow-up here in 4 weeks. NICE TO SEE YOU!

## 2018-06-02 NOTE — Assessment & Plan Note (Signed)
Continue all medications as directed, with one new addition- Fluoxetine (Prozac) 10mg  once daily. Referral to Psychology placed. Follow-up here in 4 weeks.

## 2018-06-02 NOTE — Assessment & Plan Note (Addendum)
BP stable 30 Day Holter study reassuring Slowly increase low-impact exercise- stationary bike, yoga, stretching. Remain well hydrate and follow heart healthy diet. Follow-up with Neurology as directed. Follow-up here in 4 weeks.

## 2018-06-02 NOTE — Assessment & Plan Note (Signed)
Continue all medications as directed, with one new addition- Fluoxetine (Prozac) 10mg  once daily. Referral to Psychology placed. Slowly increase low-impact exercise- stationary bike, yoga, stretching. Remain well hydrate and follow heart healthy diet. Follow-up with Neurology as directed. Follow-up here in 4 weeks.

## 2018-06-03 LAB — URINALYSIS, MICROSCOPIC ONLY: WBC, UA: NONE SEEN /hpf (ref 0–5)

## 2018-06-03 LAB — VAGINITIS/VAGINOSIS, DNA PROBE
CANDIDA SPECIES: NEGATIVE
Gardnerella vaginalis: NEGATIVE
TRICHOMONAS VAG: NEGATIVE

## 2018-06-04 LAB — URINE CULTURE

## 2018-06-06 ENCOUNTER — Ambulatory Visit (INDEPENDENT_AMBULATORY_CARE_PROVIDER_SITE_OTHER): Payer: Managed Care, Other (non HMO)

## 2018-06-06 ENCOUNTER — Other Ambulatory Visit: Payer: Self-pay | Admitting: Obstetrics and Gynecology

## 2018-06-06 VITALS — BP 115/60 | HR 68 | Resp 16 | Ht 67.0 in | Wt 228.0 lb

## 2018-06-06 DIAGNOSIS — R319 Hematuria, unspecified: Secondary | ICD-10-CM

## 2018-06-06 DIAGNOSIS — R300 Dysuria: Secondary | ICD-10-CM

## 2018-06-06 LAB — POCT URINALYSIS DIPSTICK
Bilirubin, UA: NEGATIVE
Blood, UA: POSITIVE
GLUCOSE UA: NEGATIVE
Ketones, UA: NEGATIVE
Nitrite, UA: NEGATIVE
Protein, UA: POSITIVE — AB
Urobilinogen, UA: NEGATIVE E.U./dL — AB
pH, UA: 5 (ref 5.0–8.0)

## 2018-06-06 LAB — 17-HYDROXYPROGESTERONE: 17-Hydroxyprogesterone: 47 ng/dL

## 2018-06-06 LAB — TESTT+TESTF+SHBG
Sex Hormone Binding: 41.1 nmol/L (ref 24.6–122.0)
Testosterone, Free: 2.6 pg/mL (ref 0.0–4.2)
Testosterone, Total, LC/MS: 48.9 ng/dL (ref 10.0–55.0)

## 2018-06-06 LAB — DHEA-SULFATE: DHEA-SO4: 97.4 ug/dL (ref 57.3–279.2)

## 2018-06-06 MED ORDER — PHENAZOPYRIDINE HCL 200 MG PO TABS: 200.0000 mg | ORAL_TABLET | Freq: Three times a day (TID) | ORAL | 0 refills | Status: DC | PRN

## 2018-06-06 MED ORDER — SULFAMETHOXAZOLE-TRIMETHOPRIM 800-160 MG PO TABS: 1.0000 | ORAL_TABLET | Freq: Two times a day (BID) | ORAL | 0 refills | Status: DC

## 2018-06-06 NOTE — Addendum Note (Signed)
Addended by: Yates Decamp on: 06/06/2018 05:12 PM   Modules accepted: Orders

## 2018-06-06 NOTE — Progress Notes (Signed)
See result note.  

## 2018-06-06 NOTE — Progress Notes (Signed)
Patient present for urinalysis. Pain and burning with urination. She is also suffering from pelvic pain as well.  Urin positive for blood. Sent for urine micro. Patient informed she will be called with results.    Routing to provider for final review.

## 2018-06-07 LAB — URINE CULTURE

## 2018-06-07 LAB — URINALYSIS, MICROSCOPIC ONLY: CASTS: NONE SEEN /LPF

## 2018-06-09 ENCOUNTER — Telehealth: Payer: Self-pay | Admitting: Adult Health

## 2018-06-09 NOTE — Telephone Encounter (Signed)
Yes, without restrictions Thanks! [name] [name]

## 2018-06-09 NOTE — Telephone Encounter (Signed)
Has pt been released to return to work?  Charyl Bigger, CMA

## 2018-06-09 NOTE — Telephone Encounter (Signed)
Please complete a return to work note for this patient and let her know to pick up.  Thanks!  Charyl Bigger, CMA

## 2018-06-09 NOTE — Telephone Encounter (Signed)
Forwarding message to medical assistant that pt request a Return to Work note.  --glh

## 2018-06-10 ENCOUNTER — Encounter: Payer: Self-pay | Admitting: Adult Health

## 2018-07-02 NOTE — Progress Notes (Deleted)
 Subjective:    [name], female    DOB: 06/02/1980, 38 y.o.   MRN: [id number]  HPI: 03/27/18 OV:  [name] [name] presents for hospital f/u. 03/22/18: "Pt was admitted to telemetry. Cardiac enzymes were cycled x 3 and remained negative. TSH was checked given bradycardia and was normal. Lipid panel showed elevated LDL at 139 mg/dL. Given her CP and bradycardia, she underwent coronary CTA to assess for CAD. She was premedicated w/ prednisone and benadryl given history of contrast allergy. Coronary CTA demonstrated coronary calcium score 46 which placed the patient at 99 percentile for age and sex matched control, normal coronary origin was right dominance, mild 0 to 25% disease in proximal to mid LAD, otherwise no significant coronary artery disease. 2D echo was also performed and demonstrated EF 55 to 60%, no regional wall motion normality.   Pt was last seen and examined by Dr. Klein on 03/23/2018 who determined she was stable for discharge home.  According to the Dr. Klein, patient had dizziness upon standing.  There was also significant increase in the heart rate with standing as well.  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  It is also important for her to address her panic and anxiety.  She may ultimately benefit from medication such as Florinef or ProAmatine or abdominal binders and compression stocking.  She is encouraged to follow-up with her PCP"  She was previously on citalopam 20mg QD, d/c'd earlier this year due intolerance of med, re: "made me feel weird". She reports continued dizziness since d/c from hospital. Dizziness occurs at rest, with position change, she states "it's all the time". She reports low grade nausea without vomiting. She reports that her father was recently d/c'd with Parkinson's  She also states "I feel that my hands and arms want to draw up at  times" She also reports R Parietal HA and "feeling my heartbeat in my head": HA is constant, rated 3/10 and described as throbbing. She denies any recent migraine HAs She did not have any imaging of her head during recent hospitalization She also reports urinary frequency and dysuria. UA during hospitalization showed mod leuk's, she was never started on ABX  04/10/18 OV: [name] [name] is here for f/u:dizziness She was seen by Neurology 04/07/18, OV: 1) Unexplained bradycardia with venous insufficiency - incomplete RBBB.   A repeat orthostatic blood pressure : Supine-121/79 mmHg heart rate was 49 regular beats per minute, seated -118/83, 52 bpm.Standing heart rate went up to 61 blood pressure remained at 121/82.    Dizziness noted after standing for about 30 seconds, she did not notice dizziness while in motion, she can walk without a wide-based and she can turn with 3 steps there is no vertigo noted.  This finding seem to be related to a low heart rate response.  Dysautonomia? Postural bradycardia syndrome ?  2) Migraines seem to affect her- one sided head pain and numbness -always on the right, and she noted these since she has had near faiting and finally syncopal episodes.  PLan:  1) Hydration, use the compression stockings.  2) CPAP 3) migraine prevention - Excedrine prn OTC.  Follow-up with Neuro in 2/3 months  04/01/18- ED visit for R wrist pain r/t fall- she will f/u with Orthopedic Specialist   Today she reports slight reduction in dizziness. She will have varicose vein procedure next week She will have month   long Holter Study, estimated to begin at end of Dec She has been increasing water intake and walking daily  She reports brief, palpitations during periods of acute anxiety, denies CP with exertion. 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 Her husband travels for long periods for work 1-3 weeks at a time She was on Citalopram for depression but was eventually weaned off b/c "it made me too happy". She denies thoughts of harming herself/others She is agreeable to [facility] Referral  07/03/2018 OV: [name] [name] presents for 4 week f/u: stress, GAD She was started on Fluoxetine She has   Patient Care Team    Relationship Specialty Notifications Start End  Esaw Grandchild, NP PCP - General Family Medicine  10/18/16     Patient Active Problem List   Diagnosis Date Noted  . GAD (generalized anxiety disorder) 06/02/2018  . Dysuria 03/27/2018  . Family history of Parkinsonism 03/27/2018  . Dizziness 03/27/2018  . Bradycardia 03/23/2018  . HLD (hyperlipidemia) 03/23/2018  . Coronary atherosclerosis 03/23/2018  . Chest pain 03/22/2018  . Varicose vein of leg 12/24/2017  . Venous reflux 12/24/2017  . Leukocytosis 09/29/2017  . Nausea 09/29/2017  . Anxiety 09/29/2017  . Pancreatitis 09/28/2017  . Dehydration 03/12/2017  . 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 Sleep apnea 01/09/2017  . Healthcare maintenance 10/18/2016  . LOW BACK PAIN, ACUTE 11/20/2006  . POLYCYSTIC OVARY 07/04/2006  . OBESITY, NOS 07/04/2006  . TOBACCO USE, QUIT 07/04/2006     Past Medical History:  Diagnosis Date  . Abnormal uterine bleeding   . Amenorrhea   . Anemia   . Anxiety    panic attacks  . Bradycardia   . Diabetes mellitus without complication ([place])    type 2 ---5/26 no loner in pre diabetic range since bariatric sx 6 months ago  . Dysmenorrhea   .  GERD (gastroesophageal reflux disease)    occasional - diet controlled and tums prn  . Hyperlipidemia    no meds -diet controlled  . Infertility, female   . Insulin resistance   . Migraine without aura   . Missed abortion    x 2 - both resolved without surgery  . Obesity   . PCOS (polycystic ovarian syndrome)   . PCOS (polycystic ovarian syndrome)   . PONV (postoperative nausea and vomiting)    pe rpatient ; scopalamine patch very helpful   . Sleep apnea   . SVD (spontaneous vaginal delivery)    x 1  . Urinary incontinence      Past Surgical History:  Procedure Laterality Date  . CHOLECYSTECTOMY    . COLONOSCOPY  2017   polyps  . CYSTOSCOPY N/A 10/29/2016   Procedure: CYSTOSCOPY;  Surgeon: Salvadore Dom, MD;  Location: [facility];  Service: Gynecology;  Laterality: N/A;  . DILATATION & CURRETTAGE/HYSTEROSCOPY WITH RESECTOCOPE N/A 10/17/2012   Procedure: DILATATION & CURETTAGE/HYSTEROSCOPY WITH RESECTOCOPE; Possible Polypectomy, Possible Resectoscopic Myomectomy.;  Surgeon: Floyce Stakes. Pamala Hurry, MD;  Location: [facility];  Service: Gynecology;  Laterality: N/A;  1 hr.  Marland Kitchen DILATION AND CURETTAGE OF UTERUS    . ENDOMETRIAL BIOPSY    . ENDOVENOUS ABLATION SAPHENOUS VEIN W/ LASER Right 05/08/2018   endovenous laser ablation right greater saphenous vein and stab phlebectomy > 20 incisions right leg by Deitra Mayo MD   . ESOPHAGOGASTRODUODENOSCOPY ENDOSCOPY  2017   Normal  . HYSTEROSCOPY W/D&C N/A 12/01/2013   Procedure: IUD Removal;  Surgeon: Floyce Stakes. Pamala Hurry, MD;  Location: [facility];  Service: Gynecology;  Laterality: N/A;  90 min.  . INTRAUTERINE DEVICE (IUD) INSERTION N/A 10/17/2012   Procedure: INTRAUTERINE DEVICE (IUD) INSERTION; Mirena;  Surgeon: Floyce Stakes. Pamala Hurry, MD;  Location: [facility];  Service: Gynecology;  Laterality: N/A;  . LAPAROSCOPIC GASTRIC SLEEVE RESECTION N/A 02/19/2017   Procedure: LAPAROSCOPIC GASTRIC SLEEVE RESECTION, UPPER ENDOSCOPY;  Surgeon: Johnathan Hausen,  MD;  Location: [facility];  Service: General;  Laterality: N/A;  . LAPAROSCOPIC SALPINGO OOPHERECTOMY Left 10/29/2016   Procedure: LAPAROSCOPIC SALPINGO OOPHORECTOMY With anterior uterine biopsy;  Surgeon: Salvadore Dom, MD;  Location: [facility];  Service: Gynecology;  Laterality: Left;  . MOUTH SURGERY     wisdom teeth     Family History  Problem Relation Age of Onset  . Diabetes Father   . Thyroid disease Father   . Hypertension Father   . Diabetes Maternal Grandmother   . Heart disease Maternal Grandmother   . Diabetes Maternal Grandfather   . Heart disease Maternal Grandfather   . Stroke Maternal Grandfather   . Diabetes Paternal Grandmother   . Diabetes Paternal Grandfather   . Heart disease Paternal Grandfather   . Alcohol abuse Paternal Grandfather   . Diabetes Other   . Obesity Other   . Sleep apnea Other   . Hyperlipidemia Other   . Hypertension Other   . Cancer Other   . Heart disease Other   . Hypertension Mother      Social History   Substance and Sexual Activity  Drug Use No     Social History   Substance and Sexual Activity  Alcohol Use No  . Frequency: Never     Social History   Tobacco Use  Smoking Status Former Smoker  . Packs/day: 0.25  . Years: 19.00  . Pack years: 4.75  . Types: Cigarettes  . Last attempt to quit: 06/06/2016  . Years since quitting: 2.0  Smokeless Tobacco Never Used     Outpatient Encounter Medications as of 07/03/2018  Medication Sig  . aspirin EC 81 MG EC tablet Take 1 tablet (81 mg total) by mouth daily.  . blood glucose meter kit and supplies KIT Dispense based on patient and insurance preference. Use up to four times daily as directed. (FOR ICD-9 250.00, 250.01).  . Calcium-Vitamin D-Vitamin K (CALCIUM SOFT CHEWS PO) Take by mouth daily.  . cyclobenzaprine (FLEXERIL) 10 MG tablet Take 1 tablet (10 mg total) by mouth 3 (three) times daily as needed for muscle spasms. (Patient not taking: Reported on 06/02/2018)   . diazepam (VALIUM) 5 MG tablet Take 1 tablet (5 mg total) by mouth every 8 (eight) hours as needed for anxiety.  Marland Kitchen FLUoxetine (PROZAC) 10 MG tablet Take 1 tablet (10 mg total) by mouth daily.  Marland Kitchen ibuprofen (ADVIL,MOTRIN) 200 MG tablet Take 200 mg by mouth every 6 (six) hours as needed for headache or moderate pain.  . IRON PO Take 45 mg by mouth every other day.  . levonorgestrel (MIRENA)  20 MCG/24HR IUD 1 each by Intrauterine route once.  . loratadine (CLARITIN) 10 MG tablet Take 10 mg by mouth daily as needed for allergies.  . Multiple Vitamins-Iron (MULTIVITAMINS WITH IRON) TABS tablet Take 1 tablet by mouth 2 (two) times daily. CHEWABLE  . nystatin cream (MYCOSTATIN) Apply 1 application topically 2 (two) times daily. Apply to affected area BID for up to 7 days. (Patient taking differently: Apply 1 application topically 2 (two) times daily as needed for dry skin (rash). A)  . ondansetron (ZOFRAN) 4 MG tablet Take 1 tablet (4 mg total) by mouth every 8 (eight) hours as needed for nausea or vomiting.  Marland Kitchen OVER THE COUNTER MEDICATION Take 500 mg by mouth 3 (three) times daily. Bariatric calcium '500mg'$    . pantoprazole (PROTONIX) 40 MG tablet Take 40 mg by mouth daily as needed (reflux).   . phenazopyridine (PYRIDIUM) 200 MG tablet Take 1 tablet (200 mg total) by mouth 3 (three) times daily as needed.  . sulfamethoxazole-trimethoprim (BACTRIM DS) 800-160 MG tablet Take 1 tablet by mouth 2 (two) times daily. One PO BID x 3 days   No facility-administered encounter medications on file as of 07/03/2018.     Allergies: Iohexol  There is no height or weight on file to calculate BMI.  There were no vitals taken for this visit.  Review of Systems  Constitutional: Positive for fatigue. Negative for activity change, appetite change, chills, diaphoresis, fever and unexpected weight change.  Eyes: Negative for visual disturbance.  Respiratory: Negative for cough, chest tightness, shortness of breath,  wheezing and stridor.   Cardiovascular: Negative for chest pain, palpitations and leg swelling.  Gastrointestinal: Negative for abdominal distention, anal bleeding, blood in stool, constipation, diarrhea, nausea and vomiting.  Endocrine: Negative for cold intolerance, heat intolerance, polydipsia, polyphagia and polyuria.  Skin: Negative for color change, pallor, rash and wound.  Neurological: Positive for dizziness and headaches.  Hematological: Does not bruise/bleed easily.  Psychiatric/Behavioral: Positive for sleep disturbance. Negative for agitation, behavioral problems, confusion, decreased concentration, dysphoric mood, hallucinations, self-injury and suicidal ideas. The patient is nervous/anxious. The patient is not hyperactive.        Objective:   Physical Exam Vitals signs and nursing note reviewed.  Constitutional:      General: She is not in acute distress.    Appearance: She is well-developed. She is not diaphoretic.  HENT:     Head: Normocephalic and atraumatic.     Right Ear: External ear normal.     Left Ear: External ear normal.     Nose: Nose normal.  Eyes:     Conjunctiva/sclera: Conjunctivae normal.     Pupils: Pupils are equal, round, and reactive to light.  Cardiovascular:     Rate and Rhythm: Normal rate and regular rhythm.     Heart sounds: Normal heart sounds. No murmur.  Pulmonary:     Effort: Pulmonary effort is normal. No respiratory distress.     Breath sounds: Normal breath sounds. No stridor. No wheezing, rhonchi or rales.  Chest:     Chest wall: No tenderness.  Musculoskeletal:     Right wrist: She exhibits swelling.  Skin:    General: Skin is warm and dry.     Capillary Refill: Capillary refill takes less than 2 seconds.     Coloration: Skin is not pale.     Findings: No erythema or rash.  Neurological:     Mental Status: She is alert and oriented to person, place, and time.  Motor: No tremor.     Deep Tendon Reflexes: Reflexes normal.      Comments:    Psychiatric:        Behavior: Behavior normal.        Thought Content: Thought content normal.        Judgment: Judgment normal.       Assessment & Plan:   No diagnosis found.  No problem-specific Assessment & Plan notes found for this encounter.   FOLLOW-UP:  No follow-ups on file.

## 2018-07-03 ENCOUNTER — Ambulatory Visit: Payer: Self-pay | Admitting: Adult Health

## 2018-07-07 NOTE — Progress Notes (Signed)
Subjective:    [name], female    DOB: 17-Aug-1980, 38 y.o.   MRN: [id number]  HPI: 03/27/18 OV:  [name] [name] presents for hospital f/u. 03/22/18: "Pt was admitted to telemetry. Cardiac enzymes were cycled x 3 and remained negative. TSH was checked given bradycardia and was normal. Lipid panel showed elevated LDL at 139 mg/dL. Given her CP and bradycardia, she underwent coronary CTA to assess for CAD. She was premedicated w/ prednisone and benadryl given history of contrast allergy. Coronary CTA demonstrated coronary calcium score 46 which placed the patient at 29 percentile for age and sex matched control, normal coronary origin was right dominance, mild 0 to 25% disease in proximal to mid LAD, otherwise no significant coronary artery disease. 2D echo was also performed and demonstrated EF 55 to 60%, no regional wall motion normality.   Pt was last seen and examined by Dr. Caryl Comes on 03/23/2018 who determined she was stable for discharge home.  According to the Dr. Caryl Comes, patient had dizziness upon standing.  There was also significant increase in the heart rate with standing as well.  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  It is also important for her to address her panic and anxiety.  She may ultimately benefit from medication such as Florinef or ProAmatine or abdominal binders and compression stocking.  She is encouraged to follow-up with her PCP"  She was previously on citalopam 80m QD, d/c'd earlier this year due intolerance of med, re: "made me feel weird". She reports continued dizziness since d/c from hospital. Dizziness occurs at rest, with position change, she states "it's all the time". She reports low grade nausea without vomiting. She reports that her father was recently d/c'd with Parkinson's  She also states "I feel that my hands and arms want to draw up at  times" She also reports R Parietal HA and "feeling my heartbeat in my head": HA is constant, rated 3/10 and described as throbbing. She denies any recent migraine HAs She did not have any imaging of her head during recent hospitalization She also reports urinary frequency and dysuria. UA during hospitalization showed mod leuk's, she was never started on ABX  04/10/18 OV: Ms. [name] [name] here for f/u:dizziness She was seen by Neurology 04/07/18, OV: 1) Unexplained bradycardia with venous insufficiency - incomplete RBBB.   A repeat orthostatic blood pressure : Supine-121/79 mmHg heart rate was 49 regular beats per minute, seated -118/83, 52 bpm.Standing heart rate went up to 61 blood pressure remained at 121/82.    Dizziness noted after standing for about 30 seconds, she did not notice dizziness while in motion, she can walk without a wide-based and she can turn with 3 steps there is no vertigo noted.  This finding seem to be related to a low heart rate response.  Dysautonomia? Postural bradycardia syndrome ?  2) Migraines seem to affect her- one sided head pain and numbness -always on the right, and she noted these since she has had near faiting and finally syncopal episodes.  PLan:  1) Hydration, use the compression stockings.  2) CPAP 3) migraine prevention - Excedrine prn OTC.  Follow-up with Neuro in 2/3 months  04/01/18- ED visit for R wrist pain r/t fall- she will f/u with Orthopedic Specialist   Today she reports slight reduction in dizziness. She will have varicose vein procedure next week She will have month  long Holter Study, estimated to begin at end of Dec She has been increasing water intake and walking daily  She reports brief, palpitations during periods of acute anxiety, denies CP with exertion. 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 Her husband travels for long periods for work 1-3 weeks at a time She was on Citalopram for depression but was eventually weaned off b/c "it made me too happy". She denies thoughts of harming herself/others She is agreeable to [place] Referral  07/09/2018 OV: [name] [name] is here for 4 week f/u: dizziness, GAD She as started on Fluoxetine has remained on 62m QD, she reports stable mood, denies SI/HI She reports "being able to handle life stress a little bit better" Behavioral Health referral placed months, ago she states "noone called me"- will check on status of referral She also has acute issue- Body aches, sore throat (4/10), nausea without vomiting, and loose stools (2 loose stools yesterday)- all started 24 hrs ago She denies fever, but states "I feel hot" She denies nasal drainage/cough/CP/dyspnea/dizziness/palpitations She has been using OTC Emergency and Motrin, last dose 0845, current temp 98.743fral  Patient Care Team    Relationship Specialty Notifications Start End  DaEsaw GrandchildNP PCP - General Family Medicine  10/18/16     Patient Active Problem List   Diagnosis Date Noted  . Flu-like symptoms 07/09/2018  . Pharyngitis 07/09/2018  . GAD (generalized anxiety disorder) 06/02/2018  . Dysuria 03/27/2018  . Family history of Parkinsonism 03/27/2018  . Dizziness 03/27/2018  . Bradycardia 03/23/2018  . HLD (hyperlipidemia) 03/23/2018  . Coronary atherosclerosis 03/23/2018  . Chest pain 03/22/2018  . Varicose vein of leg 12/24/2017  . Venous reflux 12/24/2017  . Leukocytosis 09/29/2017  . Nausea 09/29/2017   . Anxiety 09/29/2017  . Pancreatitis 09/28/2017  . Dehydration 03/12/2017  . 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 Sleep apnea 01/09/2017  . Healthcare maintenance 10/18/2016  . LOW BACK PAIN, ACUTE 11/20/2006  . POLYCYSTIC OVARY 07/04/2006  . OBESITY, NOS 07/04/2006  . TOBACCO USE, QUIT 07/04/2006     Past Medical History:  Diagnosis Date  . Abnormal uterine bleeding   . Amenorrhea   . Anemia   . Anxiety    panic attacks  . Bradycardia   . Diabetes mellitus without complication ([facility] [place]   type 2 ---5/26 no loner in pre diabetic range since bariatric sx 6 months ago  . Dysmenorrhea   . GERD (gastroesophageal reflux disease)    occasional - diet controlled and tums prn  . Hyperlipidemia    no meds -diet controlled  . Infertility, female   . Insulin resistance   . Migraine without aura   . Missed abortion    x 2 - both resolved without surgery  . Obesity   . PCOS (polycystic ovarian syndrome)   . PCOS (polycystic ovarian syndrome)   . PONV (postoperative nausea and vomiting)    pe rpatient ; scopalamine patch very helpful   .  Sleep apnea   . SVD (spontaneous vaginal delivery)    x 1  . Urinary incontinence      Past Surgical History:  Procedure Laterality Date  . CHOLECYSTECTOMY    . COLONOSCOPY  2017   polyps  . CYSTOSCOPY N/A 10/29/2016   Procedure: CYSTOSCOPY;  Surgeon: Salvadore Dom, MD;  Location: [facility];  Service: Gynecology;  Laterality: N/A;  . DILATATION & CURRETTAGE/HYSTEROSCOPY WITH RESECTOCOPE N/A 10/17/2012   Procedure: DILATATION & CURETTAGE/HYSTEROSCOPY WITH RESECTOCOPE; Possible Polypectomy, Possible Resectoscopic Myomectomy.;  Surgeon: Floyce Stakes. Pamala Hurry, MD;  Location: [facility];  Service: Gynecology;  Laterality: N/A;  1 hr.  Marland Kitchen DILATION AND CURETTAGE OF UTERUS    . ENDOMETRIAL BIOPSY    . ENDOVENOUS ABLATION SAPHENOUS VEIN W/ LASER Right 05/08/2018   endovenous laser ablation right greater saphenous vein and stab  phlebectomy > 20 incisions right leg by Deitra Mayo MD   . ESOPHAGOGASTRODUODENOSCOPY ENDOSCOPY  2017   Normal  . HYSTEROSCOPY W/D&C N/A 12/01/2013   Procedure: IUD Removal;  Surgeon: Floyce Stakes. Pamala Hurry, MD;  Location: [facility];  Service: Gynecology;  Laterality: N/A;  90 min.  . INTRAUTERINE DEVICE (IUD) INSERTION N/A 10/17/2012   Procedure: INTRAUTERINE DEVICE (IUD) INSERTION; Mirena;  Surgeon: Floyce Stakes. Pamala Hurry, MD;  Location: [facility];  Service: Gynecology;  Laterality: N/A;  . LAPAROSCOPIC GASTRIC SLEEVE RESECTION N/A 02/19/2017   Procedure: LAPAROSCOPIC GASTRIC SLEEVE RESECTION, UPPER ENDOSCOPY;  Surgeon: Johnathan Hausen, MD;  Location: [facility];  Service: General;  Laterality: N/A;  . LAPAROSCOPIC SALPINGO OOPHERECTOMY Left 10/29/2016   Procedure: LAPAROSCOPIC SALPINGO OOPHORECTOMY With anterior uterine biopsy;  Surgeon: Salvadore Dom, MD;  Location: [facility];  Service: Gynecology;  Laterality: Left;  . MOUTH SURGERY     wisdom teeth     Family History  Problem Relation Age of Onset  . Diabetes Father   . Thyroid disease Father   . Hypertension Father   . Diabetes Maternal Grandmother   . Heart disease Maternal Grandmother   . Diabetes Maternal Grandfather   . Heart disease Maternal Grandfather   . Stroke Maternal Grandfather   . Diabetes Paternal Grandmother   . Diabetes Paternal Grandfather   . Heart disease Paternal Grandfather   . Alcohol abuse Paternal Grandfather   . Diabetes Other   . Obesity Other   . Sleep apnea Other   . Hyperlipidemia Other   . Hypertension Other   . Cancer Other   . Heart disease Other   . Hypertension Mother      Social History   Substance and Sexual Activity  Drug Use No     Social History   Substance and Sexual Activity  Alcohol Use No  . Frequency: Never     Social History   Tobacco Use  Smoking Status Former Smoker  . Packs/day: 0.25  . Years: 19.00  . Pack years: 4.75  . Types: Cigarettes  . Last attempt to  quit: 06/06/2016  . Years since quitting: 2.0  Smokeless Tobacco Never Used     Outpatient Encounter Medications as of 07/09/2018  Medication Sig  . aspirin EC 81 MG EC tablet Take 1 tablet (81 mg total) by mouth daily.  . Calcium-Vitamin D-Vitamin K (CALCIUM SOFT CHEWS PO) Take by mouth daily.  . cyclobenzaprine (FLEXERIL) 10 MG tablet Take 1 tablet (10 mg total) by mouth 3 (three) times daily as needed for muscle spasms.  . diazepam (VALIUM) 5 MG tablet Take 1 tablet (5 mg total) by mouth  every 8 (eight) hours as needed for anxiety.  Marland Kitchen FLUoxetine (PROZAC) 10 MG tablet Take 1 tablet (10 mg total) by mouth daily.  Marland Kitchen ibuprofen (ADVIL,MOTRIN) 200 MG tablet Take 200 mg by mouth every 6 (six) hours as needed for headache or moderate pain.  . IRON PO Take 45 mg by mouth every other day.  . levonorgestrel (MIRENA) 20 MCG/24HR IUD 1 each by Intrauterine route once.  . loratadine (CLARITIN) 10 MG tablet Take 10 mg by mouth daily as needed for allergies.  . Multiple Vitamins-Iron (MULTIVITAMINS WITH IRON) TABS tablet Take 1 tablet by mouth 2 (two) times daily. CHEWABLE  . nystatin cream (MYCOSTATIN) Apply 1 application topically 2 (two) times daily. Apply to affected area BID for up to 7 days. (Patient taking differently: Apply 1 application topically 2 (two) times daily as needed for dry skin (rash). A)  . OVER THE COUNTER MEDICATION Take 500 mg by mouth 3 (three) times daily. Bariatric calcium 527m   . pantoprazole (PROTONIX) 40 MG tablet Take 40 mg by mouth daily as needed (reflux).   . [DISCONTINUED] FLUoxetine (PROZAC) 10 MG tablet Take 1 tablet (10 mg total) by mouth daily.  . [DISCONTINUED] ondansetron (ZOFRAN) 4 MG tablet Take 1 tablet (4 mg total) by mouth every 8 (eight) hours as needed for nausea or vomiting.  . ondansetron (ZOFRAN) 8 MG tablet Take 1 tablet (8 mg total) by mouth every 8 (eight) hours as needed for nausea or vomiting.  . [DISCONTINUED] blood glucose meter kit and supplies KIT  Dispense based on patient and insurance preference. Use up to four times daily as directed. (FOR ICD-9 250.00, 250.01).  . [DISCONTINUED] phenazopyridine (PYRIDIUM) 200 MG tablet Take 1 tablet (200 mg total) by mouth 3 (three) times daily as needed.  . [DISCONTINUED] sulfamethoxazole-trimethoprim (BACTRIM DS) 800-160 MG tablet Take 1 tablet by mouth 2 (two) times daily. One PO BID x 3 days   No facility-administered encounter medications on file as of 07/09/2018.     Allergies: Iohexol  Body mass index is 35.89 kg/m.  Blood pressure 110/72, pulse (!) 56, temperature 98.7 F (37.1 C), temperature source Oral, height 5' 7.75" (1.721 m), weight 234 lb 4.8 oz (106.3 kg), SpO2 100 %.  Review of Systems  Constitutional: Positive for fatigue. Negative for activity change, appetite change, chills, diaphoresis, fever and unexpected weight change.  Eyes: Negative for visual disturbance.  Respiratory: Negative for cough, chest tightness, shortness of breath, wheezing and stridor.   Cardiovascular: Negative for chest pain, palpitations and leg swelling.  Gastrointestinal: Negative for abdominal distention, anal bleeding, blood in stool, constipation, diarrhea, nausea and vomiting.  Endocrine: Negative for cold intolerance, heat intolerance, polydipsia, polyphagia and polyuria.  Skin: Negative for color change, pallor, rash and wound.  Neurological: Positive for dizziness and headaches.  Hematological: Does not bruise/bleed easily.  Psychiatric/Behavioral: Positive for sleep disturbance. Negative for agitation, behavioral problems, confusion, decreased concentration, dysphoric mood, hallucinations, self-injury and suicidal ideas. The patient is nervous/anxious. The patient is not hyperactive.        Objective:   Physical Exam Vitals signs and nursing note reviewed.  Constitutional:      General: She is not in acute distress.    Appearance: She is well-developed. She is not diaphoretic.  HENT:      Head: Normocephalic and atraumatic.     Right Ear: External ear normal.     Left Ear: External ear normal.     Nose: Nose normal.  Eyes:  Conjunctiva/sclera: Conjunctivae normal.     Pupils: Pupils are equal, round, and reactive to light.  Cardiovascular:     Rate and Rhythm: Normal rate and regular rhythm.     Heart sounds: Normal heart sounds. No murmur.  Pulmonary:     Effort: Pulmonary effort is normal. No respiratory distress.     Breath sounds: Normal breath sounds. No stridor. No wheezing, rhonchi or rales.  Chest:     Chest wall: No tenderness.  Musculoskeletal:     Right wrist: She exhibits swelling.  Skin:    General: Skin is warm and dry.     Capillary Refill: Capillary refill takes less than 2 seconds.     Coloration: Skin is not pale.     Findings: No erythema or rash.  Neurological:     Mental Status: She is alert and oriented to person, place, and time.     Motor: No tremor.     Deep Tendon Reflexes: Reflexes normal.     Comments:    Psychiatric:        Behavior: Behavior normal.        Thought Content: Thought content normal.        Judgment: Judgment normal.       Assessment & Plan:   1. Flu-like symptoms   2. Pharyngitis, unspecified etiology   3. Healthcare maintenance   4. GAD (generalized anxiety disorder)     Healthcare maintenance Continue all medications as directed. Flu Test Negative Strep Test Negative Use Ondansetron as needed and follow BRAT diet. Please [facility] will have contact information. Follow-up 6 months for complete physical, please schedule fasting labs the week prior.  GAD (generalized anxiety disorder) Mood stable, denies SI/HI [place] has attempted one contact to set up appt- clinic contact information provided Continue Fluoxetine 41m QD Continue regular exercise   Pharyngitis Strep Test Negative   Flu-like symptoms Flu Test Negative    FOLLOW-UP:  Return in about 6  months (around 01/09/2019) for Fasting Labs, CPE.

## 2018-07-09 ENCOUNTER — Encounter: Payer: Self-pay | Admitting: Adult Health

## 2018-07-09 ENCOUNTER — Ambulatory Visit (INDEPENDENT_AMBULATORY_CARE_PROVIDER_SITE_OTHER): Payer: Managed Care, Other (non HMO) | Admitting: Adult Health

## 2018-07-09 VITALS — BP 110/72 | HR 56 | Temp 98.7°F | Ht 67.75 in | Wt 234.3 lb

## 2018-07-09 DIAGNOSIS — J029 Acute pharyngitis, unspecified: Secondary | ICD-10-CM | POA: Diagnosis not present

## 2018-07-09 DIAGNOSIS — Z0000 Encounter for general adult medical examination without abnormal findings: Secondary | ICD-10-CM

## 2018-07-09 DIAGNOSIS — R6889 Other general symptoms and signs: Secondary | ICD-10-CM | POA: Insufficient documentation

## 2018-07-09 DIAGNOSIS — F411 Generalized anxiety disorder: Secondary | ICD-10-CM | POA: Diagnosis not present

## 2018-07-09 LAB — POCT RAPID STREP A (OFFICE): Rapid Strep A Screen: NEGATIVE

## 2018-07-09 LAB — POCT INFLUENZA A/B
Influenza A, POC: NEGATIVE
Influenza B, POC: NEGATIVE

## 2018-07-09 MED ORDER — ONDANSETRON HCL 8 MG PO TABS: 8.0000 mg | ORAL_TABLET | Freq: Three times a day (TID) | ORAL | 0 refills | Status: DC | PRN

## 2018-07-09 MED ORDER — FLUOXETINE HCL 10 MG PO TABS: 10.0000 mg | ORAL_TABLET | Freq: Every day | ORAL | 1 refills | Status: DC

## 2018-07-09 NOTE — Patient Instructions (Addendum)
Generalized Anxiety Disorder, Adult Generalized anxiety disorder (GAD) is a mental health disorder. People with this condition constantly worry about everyday events. Unlike normal anxiety, worry related to GAD is not triggered by a specific event. These worries also do not fade or get better with time. GAD interferes with life functions, including relationships, work, and school. GAD can vary from mild to severe. People with severe GAD can have intense waves of anxiety with physical symptoms (panic attacks). What are the causes? The exact cause of GAD is not known. What increases the risk? This condition is more likely to develop in:  Women.  People who have a family history of anxiety disorders.  People who are very shy.  People who experience very stressful life events, such as the death of a loved one.  People who have a very stressful family environment. What are the signs or symptoms? People with GAD often worry excessively about many things in their lives, such as their health and family. They may also be overly concerned about:  Doing well at work.  Being on time.  Natural disasters.  Friendships. Physical symptoms of GAD include:  Fatigue.  Muscle tension or having muscle twitches.  Trembling or feeling shaky.  Being easily startled.  Feeling like your heart is pounding or racing.  Feeling out of breath or like you cannot take a deep breath.  Having trouble falling asleep or staying asleep.  Sweating.  Nausea, diarrhea, or irritable bowel syndrome (IBS).  Headaches.  Trouble concentrating or remembering facts.  Restlessness.  Irritability. How is this diagnosed? Your health care provider can diagnose GAD based on your symptoms and medical history. You will also have a physical exam. The health care provider will ask specific questions about your symptoms, including how severe they are, when they started, and if they come and go. Your health care  provider may ask you about your use of alcohol or drugs, including prescription medicines. Your health care provider may refer you to a mental health specialist for further evaluation. Your health care provider will do a thorough examination and may perform additional tests to rule out other possible causes of your symptoms. To be diagnosed with GAD, a person must have anxiety that:  Is out of his or her control.  Affects several different aspects of his or her life, such as work and relationships.  Causes distress that makes him or her unable to take part in normal activities.  Includes at least three physical symptoms of GAD, such as restlessness, fatigue, trouble concentrating, irritability, muscle tension, or sleep problems. Before your health care provider can confirm a diagnosis of GAD, these symptoms must be present more days than they are not, and they must last for six months or longer. How is this treated? The following therapies are usually used to treat GAD:  Medicine. Antidepressant medicine is usually prescribed for long-term daily control. Antianxiety medicines may be added in severe cases, especially when panic attacks occur.  Talk therapy (psychotherapy). Certain types of talk therapy can be helpful in treating GAD by providing support, education, and guidance. Options include: ? Cognitive behavioral therapy (CBT). People learn coping skills and techniques to ease their anxiety. They learn to identify unrealistic or negative thoughts and behaviors and to replace them with positive ones. ? Acceptance and commitment therapy (ACT). This treatment teaches people how to be mindful as a way to cope with unwanted thoughts and feelings. ? Biofeedback. This process trains you to manage your body's response (  physiological response) through breathing techniques and relaxation methods. You will work with a therapist while machines are used to monitor your physical symptoms.  Stress  management techniques. These include yoga, meditation, and exercise. A mental health specialist can help determine which treatment is best for you. Some people see improvement with one type of therapy. However, other people require a combination of therapies. Follow these instructions at home:  Take over-the-counter and prescription medicines only as told by your health care provider.  Try to maintain a normal routine.  Try to anticipate stressful situations and allow extra time to manage them.  Practice any stress management or self-calming techniques as taught by your health care provider.  Do not punish yourself for setbacks or for not making progress.  Try to recognize your accomplishments, even if they are small.  Keep all follow-up visits as told by your health care provider. This is important. Contact a health care provider if:  Your symptoms do not get better.  Your symptoms get worse.  You have signs of depression, such as: ? A persistently sad, cranky, or irritable mood. ? Loss of enjoyment in activities that used to bring you joy. ? Change in weight or eating. ? Changes in sleeping habits. ? Avoiding friends or family members. ? Loss of energy for normal tasks. ? Feelings of guilt or worthlessness. Get help right away if:  You have serious thoughts about hurting yourself or others. If you ever feel like you may hurt yourself or others, or have thoughts about taking your own life, get help right away. You can go to your nearest emergency department or call:  Your local emergency services (911 in the [place]  A suicide crisis helpline, such as the [place] at 9252127518. This is open 24 hours a day. Summary  Generalized anxiety disorder (GAD) is a mental health disorder that involves worry that is not triggered by a specific event.  People with GAD often worry excessively about many things in their lives, such as their health and  family.  GAD may cause physical symptoms such as restlessness, trouble concentrating, sleep problems, frequent sweating, nausea, diarrhea, headaches, and trembling or muscle twitching.  A mental health specialist can help determine which treatment is best for you. Some people see improvement with one type of therapy. However, other people require a combination of therapies. This information is not intended to replace advice given to you by your health care provider. Make sure you discuss any questions you have with your health care provider. Document Released: 08/18/2012 Document Revised: 03/13/2016 Document Reviewed: 03/13/2016 Elsevier Interactive Patient Education  2019 [place] Diet A bland diet consists of foods that are often soft and do not have a lot of fat, fiber, or extra seasonings. Foods without fat, fiber, or seasoning are easier for the body to digest. They are also less likely to irritate your mouth, throat, stomach, and other parts of your digestive system. A bland diet is sometimes called a BRAT diet. What is my plan? Your health care provider or food and nutrition specialist (dietitian) may recommend specific changes to your diet to prevent symptoms or to treat your symptoms. These changes may include:  Eating small meals often.  Cooking food until it is soft enough to chew easily.  Chewing your food well.  Drinking fluids slowly.  Not eating foods that are very spicy, sour, or fatty.  Not eating citrus fruits, such as oranges and grapefruit. What do I  need to know about this diet?  Eat a variety of foods from the bland diet food list.  Do not follow a bland diet longer than needed.  Ask your health care provider whether you should take vitamins or supplements. What foods can I eat? Grains  Hot cereals, such as cream of wheat. Rice. Bread, crackers, or tortillas made from refined white flour. Vegetables Canned or cooked vegetables. Mashed or  boiled potatoes. Fruits  Bananas. Applesauce. Other types of cooked or canned fruit with the skin and seeds removed, such as canned peaches or pears. Meats and other proteins  Scrambled eggs. Creamy peanut butter or other nut butters. Lean, well-cooked meats, such as chicken or fish. Tofu. Soups or broths. Dairy Low-fat dairy products, such as milk, cottage cheese, or yogurt. Beverages  Water. Herbal tea. Apple juice. Fats and oils Mild salad dressings. Canola or olive oil. Sweets and desserts Pudding. Custard. Fruit gelatin. Ice cream. The items listed above may not be a complete list of recommended foods and beverages. Contact a dietitian for more options. What foods are not recommended? Grains Whole grain breads and cereals. Vegetables Raw vegetables. Fruits Raw fruits, especially citrus, berries, or dried fruits. Dairy Whole fat dairy foods. Beverages Caffeinated drinks. Alcohol. Seasonings and condiments Strongly flavored seasonings or condiments. Hot sauce. Salsa. Other foods Spicy foods. Fried foods. Sour foods, such as pickled or fermented foods. Foods with high sugar content. Foods high in fiber. The items listed above may not be a complete list of foods and beverages to avoid. Contact a dietitian for more information. Summary  A bland diet consists of foods that are often soft and do not have a lot of fat, fiber, or extra seasonings.  Foods without fat, fiber, or seasoning are easier for the body to digest.  Check with your health care provider to see how long you should follow this diet plan. It is not meant to be followed for long periods. This information is not intended to replace advice given to you by your health care provider. Make sure you discuss any questions you have with your health care provider. Document Released: 08/15/2015 Document Revised: 05/22/2017 Document Reviewed: 05/22/2017 Elsevier Interactive Patient Education  2019 [place] all medications as directed. Flu Test Negative Strep Test Negative Use Ondansetron as needed and follow BRAT diet. Please [place] will have contact information. Follow-up 6 months for complete physical, please schedule fasting labs the week prior. GREAT TO SEE YOU!

## 2018-07-09 NOTE — Assessment & Plan Note (Signed)
Flu Test Negative

## 2018-07-09 NOTE — Assessment & Plan Note (Signed)
Strep Test Negative

## 2018-07-09 NOTE — Assessment & Plan Note (Signed)
Mood stable, denies SI/HI [place] has attempted one contact to set up appt- clinic contact information provided Continue Fluoxetine 10mg  QD Continue regular exercise

## 2018-07-09 NOTE — Assessment & Plan Note (Signed)
Continue all medications as directed. Flu Test Negative Strep Test Negative Use Ondansetron as needed and follow BRAT diet. Please [place] will have contact information. Follow-up 6 months for complete physical, please schedule fasting labs the week prior.

## 2018-07-14 ENCOUNTER — Ambulatory Visit (INDEPENDENT_AMBULATORY_CARE_PROVIDER_SITE_OTHER): Payer: Managed Care, Other (non HMO) | Admitting: Adult Health

## 2018-07-14 ENCOUNTER — Encounter: Payer: Self-pay | Admitting: Adult Health

## 2018-07-14 VITALS — BP 122/77 | HR 71 | Temp 98.9°F | Ht 67.75 in | Wt 237.6 lb

## 2018-07-14 DIAGNOSIS — B372 Candidiasis of skin and nail: Secondary | ICD-10-CM | POA: Insufficient documentation

## 2018-07-14 MED ORDER — KETOCONAZOLE 2 % EX CREA: 1.0000 "application " | TOPICAL_CREAM | Freq: Every day | CUTANEOUS | 1 refills | Status: DC

## 2018-07-14 MED ORDER — DOXYCYCLINE HYCLATE 100 MG PO TABS: 100.0000 mg | ORAL_TABLET | Freq: Two times a day (BID) | ORAL | 0 refills | Status: DC

## 2018-07-14 NOTE — Patient Instructions (Addendum)
Skin Yeast Infection  A skin yeast infection is a condition in which there is an overgrowth of yeast (candida) that normally lives on the skin. This condition usually occurs in areas of the skin that are constantly warm and moist, such as the armpits or the groin. What are the causes? This condition is caused by a change in the normal balance of the yeast and bacteria that live on the skin. What increases the risk? You are more likely to develop this condition if you:  Are obese.  Are pregnant.  Take birth control pills.  Have diabetes.  Take antibiotic medicines.  Take steroid medicines.  Are malnourished.  Have a weak body defense system (immune system).  Are 38 years of age or older.  Wear tight clothing. What are the signs or symptoms? The most common symptom of this condition is itchiness in the affected area. Other symptoms include:  Red, swollen area of the skin.  Bumps on the skin. How is this diagnosed?  This condition is diagnosed with a medical history and physical exam.  Your health care provider may check for yeast by taking light scrapings of the skin to be viewed under a microscope. How is this treated? This condition is treated with medicine. Medicines may be prescribed or be available over the counter. The medicines may be:  Taken by mouth (orally).  Applied as a cream or powder to your skin. Follow these instructions at home:   Take or apply over-the-counter and prescription medicines only as told by your health care provider.  Maintain a healthy weight. If you need help losing weight, talk with your health care provider.  Keep your skin clean and dry.  If you have diabetes, keep your blood sugar under control.  Keep all follow-up visits as told by your health care provider. This is important. Contact a health care provider if:  Your symptoms go away and then return.  Your symptoms do not get better with treatment.  Your symptoms get  worse.  Your rash spreads.  You have a fever or chills.  You have new symptoms.  You have new warmth or redness of your skin. Summary  A skin yeast infection is a condition in which there is an overgrowth of yeast (candida) that normally lives on the skin. This condition is caused by a change in the normal balance of the yeast and bacteria that live on the skin.  Take or apply over-the-counter and prescription medicines only as told by your health care provider.  Keep your skin clean and dry.  Contact a health care provider if your symptoms do not get better with treatment. This information is not intended to replace advice given to you by your health care provider. Make sure you discuss any questions you have with your health care provider. Document Released: 01/09/2011 Document Revised: 09/10/2017 Document Reviewed: 09/10/2017 Elsevier Interactive Patient Education  2019 [place].  Please use Ketoconazole cream once daily for two weeks. Please use Doxycyline twice daily for 5 days. Please follow care instructions as outlined above. Work excuse provided, okay to return Thursday 07/17/2018. FEEL BETTER!

## 2018-07-14 NOTE — Assessment & Plan Note (Signed)
Please use Ketoconazole cream once daily for two weeks. Please use Doxycyline twice daily for 5 day- for associated for cellulitis. Please follow care instructions as outlined above. Work excuse provided, okay to return Thursday 07/17/2018.

## 2018-07-14 NOTE — Progress Notes (Signed)
Subjective:    [name], female    DOB: Oct 17, 1980, 38 y.o.   MRN: [id number]  HPI:   [name] [name] presents with severe yeast infection of lower abdomen and peri-umbilical area that started 48 hrs ago after working out. She reports consistent post work-out routine of shedding moist clothing and toweling off. She reports sig pain and and strong odor. Pain is constant, sharp, rated 5/10 She denies fever/night sweats/N/V/D  Patient Care Team    Relationship Specialty Notifications Start End  Esaw Grandchild, NP PCP - General Family Medicine  10/18/16     Patient Active Problem List   Diagnosis Date Noted  . Candidiasis, cutaneous 07/14/2018  . Flu-like symptoms 07/09/2018  . Pharyngitis 07/09/2018  . GAD (generalized anxiety disorder) 06/02/2018  . Dysuria 03/27/2018  . Family history of Parkinsonism 03/27/2018  . Dizziness 03/27/2018  . Bradycardia 03/23/2018  . HLD (hyperlipidemia) 03/23/2018  . Coronary atherosclerosis 03/23/2018  . Chest pain 03/22/2018  . Varicose vein of leg 12/24/2017  . Venous reflux 12/24/2017  . Leukocytosis 09/29/2017  . Nausea 09/29/2017  . Anxiety 09/29/2017  . Pancreatitis 09/28/2017  . Dehydration 03/12/2017  . 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 Sleep apnea 01/09/2017  . Healthcare maintenance 10/18/2016  . LOW BACK PAIN, ACUTE 11/20/2006  . POLYCYSTIC OVARY 07/04/2006  . OBESITY, NOS 07/04/2006  . TOBACCO USE, QUIT 07/04/2006     Past Medical History:  Diagnosis Date  . Abnormal uterine bleeding   . Amenorrhea   . Anemia   . Anxiety    panic attacks  . Bradycardia   . Diabetes mellitus without complication ([place])    type 2 ---5/26 no loner in pre diabetic range since bariatric sx 6 months ago  . Dysmenorrhea   . GERD (gastroesophageal reflux disease)    occasional - diet controlled and tums prn  . Hyperlipidemia    no meds -diet controlled  . Infertility, female   . Insulin resistance     . Migraine without aura   . Missed abortion    x 2 - both resolved without surgery  . Obesity   . PCOS (polycystic ovarian syndrome)   . PCOS (polycystic ovarian syndrome)   . PONV (postoperative nausea and vomiting)    pe rpatient ; scopalamine patch very helpful   . Sleep apnea   . SVD (spontaneous vaginal delivery)    x 1  . Urinary incontinence      Past Surgical History:  Procedure Laterality Date  . CHOLECYSTECTOMY    . COLONOSCOPY  2017   polyps  . CYSTOSCOPY N/A 10/29/2016   Procedure: CYSTOSCOPY;  Surgeon: Salvadore Dom, MD;  Location: [facility];  Service: Gynecology;  Laterality: N/A;  . DILATATION & CURRETTAGE/HYSTEROSCOPY WITH RESECTOCOPE N/A 10/17/2012   Procedure: DILATATION & CURETTAGE/HYSTEROSCOPY WITH RESECTOCOPE; Possible Polypectomy, Possible Resectoscopic Myomectomy.;  Surgeon: Floyce Stakes. Pamala Hurry, MD;  Location: [facility];  Service: Gynecology;  Laterality: N/A;  1 hr.  Marland Kitchen DILATION AND CURETTAGE OF UTERUS    . ENDOMETRIAL BIOPSY    . ENDOVENOUS ABLATION SAPHENOUS VEIN W/ LASER Right 05/08/2018   endovenous laser ablation right greater saphenous vein and stab phlebectomy > 20 incisions right leg by Deitra Mayo MD   . ESOPHAGOGASTRODUODENOSCOPY ENDOSCOPY  2017   Normal  . HYSTEROSCOPY W/D&C N/A 12/01/2013   Procedure: IUD Removal;  Surgeon: Floyce Stakes. Pamala Hurry, MD;  Location: [facility];  Service: Gynecology;  Laterality: N/A;  90 min.  Marland Kitchen  INTRAUTERINE DEVICE (IUD) INSERTION N/A 10/17/2012   Procedure: INTRAUTERINE DEVICE (IUD) INSERTION; Mirena;  Surgeon: Floyce Stakes. Pamala Hurry, MD;  Location: [facility];  Service: Gynecology;  Laterality: N/A;  . LAPAROSCOPIC GASTRIC SLEEVE RESECTION N/A 02/19/2017   Procedure: LAPAROSCOPIC GASTRIC SLEEVE RESECTION, UPPER ENDOSCOPY;  Surgeon: Johnathan Hausen, MD;  Location: [facility];  Service: General;  Laterality: N/A;  . LAPAROSCOPIC SALPINGO OOPHERECTOMY Left 10/29/2016   Procedure: LAPAROSCOPIC SALPINGO OOPHORECTOMY With anterior  uterine biopsy;  Surgeon: Salvadore Dom, MD;  Location: [facility];  Service: Gynecology;  Laterality: Left;  . MOUTH SURGERY     wisdom teeth     Family History  Problem Relation Age of Onset  . Diabetes Father   . Thyroid disease Father   . Hypertension Father   . Diabetes Maternal Grandmother   . Heart disease Maternal Grandmother   . Diabetes Maternal Grandfather   . Heart disease Maternal Grandfather   . Stroke Maternal Grandfather   . Diabetes Paternal Grandmother   . Diabetes Paternal Grandfather   . Heart disease Paternal Grandfather   . Alcohol abuse Paternal Grandfather   . Diabetes Other   . Obesity Other   . Sleep apnea Other   . Hyperlipidemia Other   . Hypertension Other   . Cancer Other   . Heart disease Other   . Hypertension Mother      Social History   Substance and Sexual Activity  Drug Use No     Social History   Substance and Sexual Activity  Alcohol Use No  . Frequency: Never     Social History   Tobacco Use  Smoking Status Former Smoker  . Packs/day: 0.25  . Years: 19.00  . Pack years: 4.75  . Types: Cigarettes  . Last attempt to quit: 06/06/2016  . Years since quitting: 2.1  Smokeless Tobacco Never Used     Outpatient Encounter Medications as of 07/14/2018  Medication Sig  . aspirin EC 81 MG EC tablet Take 1 tablet (81 mg total) by mouth daily.  . Calcium-Vitamin D-Vitamin K (CALCIUM SOFT CHEWS PO) Take by mouth daily.  . cyclobenzaprine (FLEXERIL) 10 MG tablet Take 1 tablet (10 mg total) by mouth 3 (three) times daily as needed for muscle spasms.  . diazepam (VALIUM) 5 MG tablet Take 1 tablet (5 mg total) by mouth every 8 (eight) hours as needed for anxiety.  Marland Kitchen FLUoxetine (PROZAC) 10 MG tablet Take 1 tablet (10 mg total) by mouth daily.  Marland Kitchen ibuprofen (ADVIL,MOTRIN) 200 MG tablet Take 200 mg by mouth every 6 (six) hours as needed for headache or moderate pain.  . IRON PO Take 45 mg by mouth every other day.  . levonorgestrel  (MIRENA) 20 MCG/24HR IUD 1 each by Intrauterine route once.  . loratadine (CLARITIN) 10 MG tablet Take 10 mg by mouth daily as needed for allergies.  . Multiple Vitamins-Iron (MULTIVITAMINS WITH IRON) TABS tablet Take 1 tablet by mouth 2 (two) times daily. CHEWABLE  . nystatin cream (MYCOSTATIN) Apply 1 application topically 2 (two) times daily. Apply to affected area BID for up to 7 days. (Patient taking differently: Apply 1 application topically 2 (two) times daily as needed for dry skin (rash). A)  . ondansetron (ZOFRAN) 8 MG tablet Take 1 tablet (8 mg total) by mouth every 8 (eight) hours as needed for nausea or vomiting.  Marland Kitchen OVER THE COUNTER MEDICATION Take 500 mg by mouth 3 (three) times daily. Bariatric calcium 500mg    . pantoprazole (PROTONIX)  40 MG tablet Take 40 mg by mouth daily as needed (reflux).   Marland Kitchen doxycycline (VIBRA-TABS) 100 MG tablet Take 1 tablet (100 mg total) by mouth 2 (two) times daily.  Marland Kitchen ketoconazole (NIZORAL) 2 % cream Apply 1 application topically daily. Treat for two weeks  . [DISCONTINUED] doxycycline (VIBRA-TABS) 100 MG tablet Take 1 tablet (100 mg total) by mouth 2 (two) times daily.   No facility-administered encounter medications on file as of 07/14/2018.     Allergies: Iohexol  Body mass index is 36.39 kg/m.  Blood pressure 122/77, pulse 71, temperature 98.9 F (37.2 C), temperature source Oral, height 5' 7.75" (1.721 m), weight 237 lb 9.6 oz (107.8 kg), SpO2 98 %.  Review of Systems  Constitutional: Positive for fatigue. Negative for activity change, appetite change, chills, diaphoresis, fever and unexpected weight change.  HENT: Negative for congestion.   Respiratory: Negative for cough, chest tightness, shortness of breath, wheezing and stridor.   Cardiovascular: Negative for chest pain, palpitations and leg swelling.  Gastrointestinal: Negative for abdominal distention, abdominal pain, blood in stool, constipation, diarrhea, nausea and vomiting.   Endocrine: Negative for cold intolerance, heat intolerance, polydipsia, polyphagia and polyuria.  Skin: Positive for color change and rash. Negative for pallor and wound.  Neurological: Negative for dizziness and headaches.  Hematological: Does not bruise/bleed easily.  Psychiatric/Behavioral: Positive for sleep disturbance.       Objective:   Physical Exam Constitutional:      General: She is in acute distress.     Appearance: She is normal weight. She is ill-appearing. She is not toxic-appearing or diaphoretic.  HENT:     Head: Normocephalic and atraumatic.  Abdominal:     General: Abdomen is flat.  Skin:    General: Skin is warm and dry.     Capillary Refill: Capillary refill takes less than 2 seconds.     Findings: Erythema present.          Comments: Cutaneous Candidiasis L groin and periumbilical Tissue warm to touch and some areas have serous drainage Strong yeast odor noted   Neurological:     Mental Status: She is alert and oriented to person, place, and time.  Psychiatric:        Mood and Affect: Mood normal.        Behavior: Behavior normal.        Thought Content: Thought content normal.        Judgment: Judgment normal.       Assessment & Plan:   1. Candidiasis, cutaneous     Candidiasis, cutaneous Please use Ketoconazole cream once daily for two weeks. Please use Doxycyline twice daily for 5 day- for associated for cellulitis. Please follow care instructions as outlined above. Work excuse provided, okay to return Thursday 07/17/2018.    FOLLOW-UP:  Return if symptoms worsen or fail to improve.

## 2018-07-23 ENCOUNTER — Ambulatory Visit: Payer: Managed Care, Other (non HMO) | Admitting: Neurology

## 2018-07-28 ENCOUNTER — Encounter: Payer: Self-pay | Admitting: Adult Health

## 2018-07-29 ENCOUNTER — Encounter: Payer: Self-pay | Admitting: Adult Health

## 2018-08-14 ENCOUNTER — Ambulatory Visit: Payer: Managed Care, Other (non HMO) | Admitting: Neurology

## 2018-10-06 ENCOUNTER — Encounter (HOSPITAL_COMMUNITY): Payer: Self-pay

## 2018-10-06 HISTORY — PX: LASIK: SHX215

## 2018-10-14 ENCOUNTER — Other Ambulatory Visit: Payer: Self-pay

## 2018-10-14 ENCOUNTER — Encounter (INDEPENDENT_AMBULATORY_CARE_PROVIDER_SITE_OTHER): Payer: Managed Care, Other (non HMO) | Admitting: Ophthalmology

## 2018-10-14 DIAGNOSIS — H43813 Vitreous degeneration, bilateral: Secondary | ICD-10-CM

## 2018-10-14 DIAGNOSIS — E11319 Type 2 diabetes mellitus with unspecified diabetic retinopathy without macular edema: Secondary | ICD-10-CM | POA: Diagnosis not present

## 2018-10-14 DIAGNOSIS — H5213 Myopia, bilateral: Secondary | ICD-10-CM | POA: Diagnosis not present

## 2018-10-14 DIAGNOSIS — E113291 Type 2 diabetes mellitus with mild nonproliferative diabetic retinopathy without macular edema, right eye: Secondary | ICD-10-CM

## 2018-11-06 ENCOUNTER — Ambulatory Visit: Payer: Managed Care, Other (non HMO) | Admitting: Neurology

## 2018-12-11 ENCOUNTER — Telehealth: Payer: Self-pay | Admitting: Adult Health

## 2018-12-11 DIAGNOSIS — U071 COVID-19: Secondary | ICD-10-CM

## 2018-12-11 NOTE — Telephone Encounter (Signed)
Patient requesting COVID test order, she had a positive exposure at work and the last week has been dealing with fatigue, muscle aches, sore throat, headache. Please advise if we can place order so we can contact patient to go to [place].

## 2018-12-11 NOTE — Addendum Note (Signed)
Addended by: Fonnie Mu on: 12/11/2018 02:58 PM   Modules accepted: Orders

## 2018-12-12 ENCOUNTER — Other Ambulatory Visit: Payer: Self-pay

## 2018-12-12 DIAGNOSIS — Z20822 Contact with and (suspected) exposure to covid-19: Secondary | ICD-10-CM

## 2018-12-14 ENCOUNTER — Encounter: Payer: Self-pay | Admitting: Adult Health

## 2018-12-14 LAB — NOVEL CORONAVIRUS, NAA: SARS-CoV-2, NAA: NOT DETECTED

## 2018-12-31 ENCOUNTER — Telehealth: Payer: Self-pay | Admitting: Adult Health

## 2018-12-31 NOTE — Telephone Encounter (Signed)
Please call pt and provide info for testing facility.  Charyl Bigger, CMA

## 2018-12-31 NOTE — Telephone Encounter (Signed)
Patient called states son had an positive exposure to [place] now her employer will not let her return to work w/o being tested.  --Forwarding referral request to medical assistant. --Dion Body

## 2019-01-01 ENCOUNTER — Other Ambulatory Visit: Payer: Self-pay

## 2019-01-01 DIAGNOSIS — Z20822 Contact with and (suspected) exposure to covid-19: Secondary | ICD-10-CM

## 2019-01-03 LAB — NOVEL CORONAVIRUS, NAA: SARS-CoV-2, NAA: NOT DETECTED

## 2019-01-08 ENCOUNTER — Other Ambulatory Visit: Payer: Managed Care, Other (non HMO)

## 2019-01-08 ENCOUNTER — Other Ambulatory Visit: Payer: Self-pay

## 2019-01-08 DIAGNOSIS — D72829 Elevated white blood cell count, unspecified: Secondary | ICD-10-CM

## 2019-01-08 DIAGNOSIS — Z0000 Encounter for general adult medical examination without abnormal findings: Secondary | ICD-10-CM

## 2019-01-08 DIAGNOSIS — E785 Hyperlipidemia, unspecified: Secondary | ICD-10-CM

## 2019-01-09 LAB — LIPID PANEL
Chol/HDL Ratio: 3.7 ratio (ref 0.0–4.4)
Cholesterol, Total: 194 mg/dL (ref 100–199)
HDL: 52 mg/dL (ref >39)
LDL Chol Calc (NIH): 125 mg/dL — ABNORMAL HIGH (ref 0–99)
Triglycerides: 93 mg/dL (ref 0–149)
VLDL Cholesterol Cal: 17 mg/dL (ref 5–40)

## 2019-01-09 LAB — COMPREHENSIVE METABOLIC PANEL
ALT: 6 IU/L (ref 0–32)
AST: 9 IU/L (ref 0–40)
Albumin/Globulin Ratio: 1.5 (ref 1.2–2.2)
Albumin: 4.2 g/dL (ref 3.8–4.8)
Alkaline Phosphatase: 73 IU/L (ref 39–117)
BUN/Creatinine Ratio: 22 (ref 9–23)
BUN: 17 mg/dL (ref 6–20)
Bilirubin Total: 0.5 mg/dL (ref 0.0–1.2)
CO2: 24 mmol/L (ref 20–29)
Calcium: 9.4 mg/dL (ref 8.7–10.2)
Chloride: 103 mmol/L (ref 96–106)
Creatinine, Ser: 0.77 mg/dL (ref 0.57–1.00)
GFR calc Af Amer: 113 mL/min/{1.73_m2} (ref >59)
GFR calc non Af Amer: 98 mL/min/{1.73_m2} (ref >59)
Globulin, Total: 2.8 g/dL (ref 1.5–4.5)
Glucose: 91 mg/dL (ref 65–99)
Potassium: 4.3 mmol/L (ref 3.5–5.2)
Sodium: 139 mmol/L (ref 134–144)
Total Protein: 7 g/dL (ref 6.0–8.5)

## 2019-01-09 LAB — CBC WITH DIFFERENTIAL/PLATELET
Basophils Absolute: 0 10*3/uL (ref 0.0–0.2)
Basos: 0 %
EOS (ABSOLUTE): 0.1 10*3/uL (ref 0.0–0.4)
Eos: 2 %
Hematocrit: 42.3 % (ref 34.0–46.6)
Hemoglobin: 14.1 g/dL (ref 11.1–15.9)
Immature Grans (Abs): 0 10*3/uL (ref 0.0–0.1)
Immature Granulocytes: 0 %
Lymphocytes Absolute: 2.5 10*3/uL (ref 0.7–3.1)
Lymphs: 28 %
MCH: 29.2 pg (ref 26.6–33.0)
MCHC: 33.3 g/dL (ref 31.5–35.7)
MCV: 88 fL (ref 79–97)
Monocytes Absolute: 0.7 10*3/uL (ref 0.1–0.9)
Monocytes: 8 %
Neutrophils Absolute: 5.5 10*3/uL (ref 1.4–7.0)
Neutrophils: 62 %
Platelets: 275 10*3/uL (ref 150–450)
RBC: 4.83 x10E6/uL (ref 3.77–5.28)
RDW: 12.4 % (ref 11.7–15.4)
WBC: 9 10*3/uL (ref 3.4–10.8)

## 2019-01-09 LAB — TSH: TSH: 2.21 u[IU]/mL (ref 0.450–4.500)

## 2019-01-09 LAB — HEMOGLOBIN A1C
Est. average glucose Bld gHb Est-mCnc: 105 mg/dL
Hgb A1c MFr Bld: 5.3 % (ref 4.8–5.6)

## 2019-01-13 NOTE — Progress Notes (Signed)
Subjective:    [name], female    DOB: 06-20-1980, 38 y.o.   MRN: [id number]  HPI:  [name] [name] is here for CPE She is concerned with possible STI exposure Her husband is an "over the road" truck driver and has been unfaithful in past and she suspects in the last month as well. She and her husband had unprotected sex about 1.5 weeks ago. 4-5 days ago she started experiencing vaginal d/c (thick pink/brown), vaginal itching, urinary frequency/urgency, and dysuria. She denies abdominal pain/N/V/D She denies fever/chills/night sweats She denies flank pain She would like full STI testing  She has increased regular exercise- 3 days/week with personal trainer, 2 days/week home work-outs  She drinks 80-100oz water/day Follows lower cal diet 1200-1500/day She has lost >6 lbs since last OV in March Current wt 237 Goal wt <200  01/08/2019 Labs: TSH-WNL, 2.210  CMP-stable  CBC-stable  A1c-5.3  Lipid Panel-  Tot-194  Trigs-93  HDL-52  LDL-125  Reduce saturated fat, continue regular exercise- re-check lipids in 6 months  Healthcare Maintenance: PAP-UTD, last 2017- will update with OB/GYN 05/2019 Immunizations-UTD   Patient Care Team    Relationship Specialty Notifications Start End  Esaw Grandchild, NP PCP - General Family Medicine  10/18/16     Patient Active Problem List   Diagnosis Date Noted  . Candidiasis, cutaneous 07/14/2018  . Flu-like symptoms 07/09/2018  . Pharyngitis 07/09/2018  . GAD (generalized anxiety disorder) 06/02/2018  . Dysuria 03/27/2018  . Family history of Parkinsonism 03/27/2018  . Dizziness 03/27/2018  . Bradycardia 03/23/2018  . HLD (hyperlipidemia) 03/23/2018  . Coronary atherosclerosis 03/23/2018  . Chest pain 03/22/2018  . Varicose vein of leg 12/24/2017  . Venous reflux 12/24/2017  . Leukocytosis 09/29/2017  . Nausea 09/29/2017  . Anxiety 09/29/2017  . Pancreatitis 09/28/2017  . Dehydration 03/12/2017  . 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 Sleep apnea 01/09/2017  . Healthcare maintenance 10/18/2016  . LOW BACK PAIN, ACUTE 11/20/2006  . POLYCYSTIC OVARY 07/04/2006  . OBESITY, NOS 07/04/2006  . TOBACCO USE, QUIT 07/04/2006     Past Medical History:  Diagnosis Date  . Abnormal uterine bleeding   . Amenorrhea   . Anemia   . Anxiety    panic attacks  . Bradycardia   . Diabetes mellitus without complication ([employer] [facility])    type 2 ---5/26 no loner in pre diabetic range since bariatric sx 6 months ago  . Dysmenorrhea   . GERD (gastroesophageal reflux disease)    occasional - diet controlled and tums prn  . Hyperlipidemia    no meds -diet controlled  . Infertility, female   . Insulin resistance   . Migraine without aura   . Missed abortion    x 2 - both resolved without surgery  . Obesity   . PCOS (polycystic ovarian syndrome)   . PCOS (polycystic ovarian syndrome)   . PONV (postoperative nausea and vomiting)    pe rpatient ; scopalamine patch very helpful   . Sleep apnea   . SVD (spontaneous vaginal delivery)    x 1  . Urinary incontinence      Past Surgical History:  Procedure Laterality Date  . CHOLECYSTECTOMY    . COLONOSCOPY  2017   polyps  . CYSTOSCOPY N/A 10/29/2016   Procedure: CYSTOSCOPY;  Surgeon: Salvadore Dom, MD;  Location: [facility];  Service: Gynecology;  Laterality: N/A;  . DILATATION & CURRETTAGE/HYSTEROSCOPY WITH RESECTOCOPE N/A 10/17/2012  Procedure: DILATATION & CURETTAGE/HYSTEROSCOPY WITH RESECTOCOPE; Possible Polypectomy, Possible Resectoscopic Myomectomy.;  Surgeon: Floyce Stakes. Pamala Hurry, MD;  Location: [facility];  Service: Gynecology;  Laterality: N/A;  1 hr.  Marland Kitchen DILATION AND CURETTAGE OF UTERUS    . ENDOMETRIAL BIOPSY    . ENDOVENOUS ABLATION SAPHENOUS VEIN W/ LASER Right 05/08/2018   endovenous laser ablation right greater saphenous vein and stab phlebectomy > 20 incisions right leg by [name] Mayo MD   . ESOPHAGOGASTRODUODENOSCOPY ENDOSCOPY   2017   Normal  . HYSTEROSCOPY W/D&C N/A 12/01/2013   Procedure: IUD Removal;  Surgeon: Floyce Stakes. Pamala Hurry, MD;  Location: [facility];  Service: Gynecology;  Laterality: N/A;  90 min.  . INTRAUTERINE DEVICE (IUD) INSERTION N/A 10/17/2012   Procedure: INTRAUTERINE DEVICE (IUD) INSERTION; Mirena;  Surgeon: Floyce Stakes. Pamala Hurry, MD;  Location: [facility];  Service: Gynecology;  Laterality: N/A;  . LAPAROSCOPIC GASTRIC SLEEVE RESECTION N/A 02/19/2017   Procedure: LAPAROSCOPIC GASTRIC SLEEVE RESECTION, UPPER ENDOSCOPY;  Surgeon: Johnathan Hausen, MD;  Location: [facility];  Service: General;  Laterality: N/A;  . LAPAROSCOPIC SALPINGO OOPHERECTOMY Left 10/29/2016   Procedure: LAPAROSCOPIC SALPINGO OOPHORECTOMY With anterior uterine biopsy;  Surgeon: Salvadore Dom, MD;  Location: [facility];  Service: Gynecology;  Laterality: Left;  . LASIK  10/06/2018  . MOUTH SURGERY     wisdom teeth     Family History  Problem Relation Age of Onset  . Diabetes Father   . Thyroid disease Father   . Hypertension Father   . Diabetes Maternal Grandmother   . Heart disease Maternal Grandmother   . Diabetes Maternal Grandfather   . Heart disease Maternal Grandfather   . Stroke Maternal Grandfather   . Diabetes Paternal Grandmother   . Diabetes Paternal Grandfather   . Heart disease Paternal Grandfather   . Alcohol abuse Paternal Grandfather   . Diabetes Other   . Obesity Other   . Sleep apnea Other   . Hyperlipidemia Other   . Hypertension Other   . Cancer Other   . Heart disease Other   . Hypertension Mother      Social History   Substance and Sexual Activity  Drug Use No     Social History   Substance and Sexual Activity  Alcohol Use No  . Frequency: Never     Social History   Tobacco Use  Smoking Status Former Smoker  . Packs/day: 0.25  . Years: 19.00  . Pack years: 4.75  . Types: Cigarettes  . Quit date: 06/06/2016  . Years since quitting: 2.6  Smokeless Tobacco Never Used     Outpatient  Encounter Medications as of 01/15/2019  Medication Sig  . aspirin EC 81 MG EC tablet Take 1 tablet (81 mg total) by mouth daily.  . Calcium-Vitamin D-Vitamin K (CALCIUM SOFT CHEWS PO) Take by mouth daily.  . cyclobenzaprine (FLEXERIL) 10 MG tablet Take 1 tablet (10 mg total) by mouth 3 (three) times daily as needed for muscle spasms.  . diazepam (VALIUM) 5 MG tablet Take 1 tablet (5 mg total) by mouth every 8 (eight) hours as needed for anxiety.  Marland Kitchen ibuprofen (ADVIL,MOTRIN) 200 MG tablet Take 200 mg by mouth every 6 (six) hours as needed for headache or moderate pain.  . IRON PO Take 45 mg by mouth every other day.  . levonorgestrel (MIRENA) 20 MCG/24HR IUD 1 each by Intrauterine route once.  . loratadine (CLARITIN) 10 MG tablet Take 10 mg by mouth daily as needed for allergies.  . Multiple  Vitamins-Iron (MULTIVITAMINS WITH IRON) TABS tablet Take 1 tablet by mouth 2 (two) times daily. CHEWABLE  . ondansetron (ZOFRAN) 8 MG tablet Take 1 tablet (8 mg total) by mouth every 8 (eight) hours as needed for nausea or vomiting.  Marland Kitchen OVER THE COUNTER MEDICATION Take 500 mg by mouth 3 (three) times daily. Bariatric calcium 500mg    . nitrofurantoin, macrocrystal-monohydrate, (MACROBID) 100 MG capsule Take 1 capsule (100 mg total) by mouth 2 (two) times daily.  . [DISCONTINUED] doxycycline (VIBRA-TABS) 100 MG tablet Take 1 tablet (100 mg total) by mouth 2 (two) times daily.  . [DISCONTINUED] FLUoxetine (PROZAC) 10 MG tablet Take 1 tablet (10 mg total) by mouth daily.  . [DISCONTINUED] ketoconazole (NIZORAL) 2 % cream Apply 1 application topically daily. Treat for two weeks  . [DISCONTINUED] nystatin cream (MYCOSTATIN) Apply 1 application topically 2 (two) times daily. Apply to affected area BID for up to 7 days. (Patient taking differently: Apply 1 application topically 2 (two) times daily as needed for dry skin (rash). A)  . [DISCONTINUED] pantoprazole (PROTONIX) 40 MG tablet Take 40 mg by mouth daily as needed  (reflux).    No facility-administered encounter medications on file as of 01/15/2019.     Allergies: Iohexol  Body mass index is 34.93 kg/m.  Blood pressure 109/69, pulse (!) 50, temperature 98.6 F (37 C), temperature source Oral, height 5' 8.25" (1.734 m), weight 231 lb 6.4 oz (105 kg), SpO2 99 %.   Review of Systems  Constitutional: Positive for fatigue. Negative for activity change, appetite change, chills, diaphoresis, fever and unexpected weight change.  HENT: Negative for congestion.   Eyes: Negative for visual disturbance.  Respiratory: Negative for cough, chest tightness, shortness of breath, wheezing and stridor.   Cardiovascular: Negative for chest pain, palpitations and leg swelling.  Gastrointestinal: Negative for abdominal distention, anal bleeding, blood in stool, constipation, diarrhea, nausea and vomiting.  Endocrine: Negative for cold intolerance, heat intolerance, polydipsia, polyphagia and polyuria.  Genitourinary: Negative for difficulty urinating and flank pain.  Musculoskeletal: Negative for arthralgias, back pain, gait problem, joint swelling, myalgias, neck pain and neck stiffness.  Skin: Negative for color change, pallor, rash and wound.  Neurological: Negative for dizziness and headaches.  Hematological: Negative for adenopathy. Does not bruise/bleed easily.  Psychiatric/Behavioral: Negative for agitation, behavioral problems, confusion, decreased concentration, dysphoric mood, hallucinations, self-injury, sleep disturbance and suicidal ideas. The patient is not nervous/anxious and is not hyperactive.        Objective:   Physical Exam Vitals signs and nursing note reviewed. Exam conducted with a chaperone present.  Constitutional:      General: She is not in acute distress.    Appearance: Normal appearance. She is obese. She is not ill-appearing, toxic-appearing or diaphoretic.  HENT:     Head: Normocephalic and atraumatic.     Right Ear: Tympanic  membrane, ear canal and external ear normal.     Left Ear: Tympanic membrane, ear canal and external ear normal.     Nose: Nose normal. No congestion.     Mouth/Throat:     Mouth: Mucous membranes are moist.     Pharynx: No oropharyngeal exudate.  Eyes:     Extraocular Movements: Extraocular movements intact.     Conjunctiva/sclera: Conjunctivae normal.     Pupils: Pupils are equal, round, and reactive to light.  Neck:     Musculoskeletal: Normal range of motion and neck supple. No muscular tenderness.  Cardiovascular:     Rate and Rhythm: Normal rate and  regular rhythm.     Pulses: Normal pulses.     Heart sounds: Normal heart sounds. No murmur. No friction rub. No gallop.   Pulmonary:     Effort: Pulmonary effort is normal. No respiratory distress.     Breath sounds: Normal breath sounds. No stridor. No wheezing, rhonchi or rales.  Chest:     Chest wall: No tenderness.  Abdominal:     General: Abdomen is protuberant. Bowel sounds are normal.     Palpations: Abdomen is soft.     Tenderness: There is no abdominal tenderness. There is no right CVA tenderness, left CVA tenderness, guarding or rebound. Negative signs include Murphy's sign and McBurney's sign.     Hernia: No hernia is present.  Genitourinary:    Labia:        Right: No rash, tenderness or lesion.        Left: No rash, tenderness or lesion.      Vagina: Vaginal discharge present.     Comments: Thick/white vaginal d/c  Musculoskeletal: Normal range of motion.        General: No tenderness.  Skin:    General: Skin is warm and dry.     Capillary Refill: Capillary refill takes less than 2 seconds.  Neurological:     Mental Status: She is alert and oriented to person, place, and time.     Coordination: Coordination normal.  Psychiatric:        Mood and Affect: Mood normal.        Behavior: Behavior normal.        Thought Content: Thought content normal.        Judgment: Judgment normal.        Assessment &  Plan:   1. Vaginal discharge   2. Healthcare maintenance   3. Routine screening for STI (sexually transmitted infection)   4. Dysuria   5. Abnormal urinalysis     Healthcare maintenance Overall labs look very good, with elevation in LDL (bad) cholesterol. Reduce saturated fat and continue your excellent exercise regime. When you come back for follow-up in 6 months, please come fasting so we can re-check cholesterol panel. We will call you when all STI results are in- avoid sexual contact until you are called. Continue to social distance and wear a mask when in public. Follow-up 6 months.  Dysuria UA: Blood-trace-lysed Nit + Leu + trace Nitrofurantoin 100mg  BIDx 5 days Sent for C/S    FOLLOW-UP:  Return in about 6 months (around 07/15/2019) for Regular Follow Up, Fasting Labs.

## 2019-01-15 ENCOUNTER — Other Ambulatory Visit: Payer: Self-pay

## 2019-01-15 ENCOUNTER — Encounter: Payer: Self-pay | Admitting: Adult Health

## 2019-01-15 ENCOUNTER — Ambulatory Visit (INDEPENDENT_AMBULATORY_CARE_PROVIDER_SITE_OTHER): Payer: Managed Care, Other (non HMO) | Admitting: Adult Health

## 2019-01-15 VITALS — BP 109/69 | HR 50 | Temp 98.6°F | Ht 68.25 in | Wt 231.4 lb

## 2019-01-15 DIAGNOSIS — N898 Other specified noninflammatory disorders of vagina: Secondary | ICD-10-CM

## 2019-01-15 DIAGNOSIS — R300 Dysuria: Secondary | ICD-10-CM | POA: Diagnosis not present

## 2019-01-15 DIAGNOSIS — Z113 Encounter for screening for infections with a predominantly sexual mode of transmission: Secondary | ICD-10-CM

## 2019-01-15 DIAGNOSIS — Z0000 Encounter for general adult medical examination without abnormal findings: Secondary | ICD-10-CM

## 2019-01-15 DIAGNOSIS — R8290 Unspecified abnormal findings in urine: Secondary | ICD-10-CM

## 2019-01-15 LAB — POCT URINALYSIS DIPSTICK
Bilirubin, UA: NEGATIVE
Glucose, UA: NEGATIVE
Ketones, UA: NEGATIVE
Nitrite, UA: POSITIVE
Protein, UA: NEGATIVE
Spec Grav, UA: >=1.03 — AB (ref 1.010–1.025)
Urobilinogen, UA: 0.2 E.U./dL
pH, UA: 5.5 (ref 5.0–8.0)

## 2019-01-15 MED ORDER — NITROFURANTOIN MONOHYD MACRO 100 MG PO CAPS: 100.0000 mg | ORAL_CAPSULE | Freq: Two times a day (BID) | ORAL | 0 refills | Status: DC

## 2019-01-15 MED FILL — NITROFURANTOIN MONO-MCR 100: 100 | 5 days supply | Qty: 10 | Fill #0

## 2019-01-15 NOTE — Assessment & Plan Note (Signed)
Overall labs look very good, with elevation in LDL (bad) cholesterol. Reduce saturated fat and continue your excellent exercise regime. When you come back for follow-up in 6 months, please come fasting so we can re-check cholesterol panel. We will call you when all STI results are in- avoid sexual contact until you are called. Continue to social distance and wear a mask when in public. Follow-up 6 months.

## 2019-01-15 NOTE — Assessment & Plan Note (Signed)
UA: Blood-trace-lysed Nit + Leu + trace Nitrofurantoin 100mg  BIDx 5 days Sent for C/S

## 2019-01-15 NOTE — Patient Instructions (Addendum)
Preventive Care for Adults, Female  A healthy lifestyle and preventive care can promote health and wellness. Preventive health guidelines for women include the following key practices.   A routine yearly physical is a good way to check with your health care provider about your health and preventive screening. It is a chance to share any concerns and updates on your health and to receive a thorough exam.   Visit your dentist for a routine exam and preventive care every 6 months. Brush your teeth twice a day and floss once a day. Good oral hygiene prevents tooth decay and gum disease.   The frequency of eye exams is based on your age, health, family medical history, use of contact lenses, and other factors. Follow your health care provider's recommendations for frequency of eye exams.   Eat a healthy diet. Foods like vegetables, fruits, whole grains, low-fat dairy products, and lean protein foods contain the nutrients you need without too many calories. Decrease your intake of foods high in solid fats, added sugars, and salt. Eat the right amount of calories for you.Get information about a proper diet from your health care provider, if necessary.   Regular physical exercise is one of the most important things you can do for your health. Most adults should get at least 150 minutes of moderate-intensity exercise (any activity that increases your heart rate and causes you to sweat) each week. In addition, most adults need muscle-strengthening exercises on 2 or more days a week.   Maintain a healthy weight. The body mass index (BMI) is a screening tool to identify possible weight problems. It provides an estimate of body fat based on height and weight. Your health care provider can find your BMI, and can help you achieve or maintain a healthy weight.For adults 20 years and older:   - A BMI below 18.5 is considered underweight.   - A BMI of 18.5 to 24.9 is normal.   - A BMI of 25 to 29.9 is  considered overweight.   - A BMI of 30 and above is considered obese.   Maintain normal blood lipids and cholesterol levels by exercising and minimizing your intake of trans and saturated fats.  Eat a balanced diet with plenty of fruit and vegetables. Blood tests for lipids and cholesterol should begin at age 20 and be repeated every 5 years minimum.  If your lipid or cholesterol levels are high, you are over 40, or you are at high risk for heart disease, you may need your cholesterol levels checked more frequently.Ongoing high lipid and cholesterol levels should be treated with medicines if diet and exercise are not working.   If you smoke, find out from your health care provider how to quit. If you do not use tobacco, do not start.   Lung cancer screening is recommended for adults aged 55-80 years who are at high risk for developing lung cancer because of a history of smoking. A yearly low-dose CT scan of the lungs is recommended for people who have at least a 30-pack-year history of smoking and are a current smoker or have quit within the past 15 years. A pack year of smoking is smoking an average of 1 pack of cigarettes a day for 1 year (for example: 1 pack a day for 30 years or 2 packs a day for 15 years). Yearly screening should continue until the smoker has stopped smoking for at least 15 years. Yearly screening should be stopped for people who develop a   health problem that would prevent them from having lung cancer treatment.   If you are pregnant, do not drink alcohol. If you are breastfeeding, be very cautious about drinking alcohol. If you are not pregnant and choose to drink alcohol, do not have more than 1 drink per day. One drink is considered to be 12 ounces (355 mL) of beer, 5 ounces (148 mL) of wine, or 1.5 ounces (44 mL) of liquor.   Avoid use of street drugs. Do not share needles with anyone. Ask for help if you need support or instructions about stopping the use of  drugs.   High blood pressure causes heart disease and increases the risk of stroke. Your blood pressure should be checked at least yearly.  Ongoing high blood pressure should be treated with medicines if weight loss and exercise do not work.   If you are 69-55 years old, ask your health care provider if you should take aspirin to prevent strokes.   Diabetes screening involves taking a blood sample to check your fasting blood sugar level. This should be done once every 3 years, after age 38, if you are within normal weight and without risk factors for diabetes. Testing should be considered at a younger age or be carried out more frequently if you are overweight and have at least 1 risk factor for diabetes.   Breast cancer screening is essential preventive care for women. You should practice "breast self-awareness."  This means understanding the normal appearance and feel of your breasts and may include breast self-examination.  Any changes detected, no matter how small, should be reported to a health care provider.  Women in their 80s and 30s should have a clinical breast exam (CBE) by a health care provider as part of a regular health exam every 1 to 3 years.  After age 66, women should have a CBE every year.  Starting at age 1, women should consider having a mammogram (breast X-ray test) every year.  Women who have a family history of breast cancer should talk to their health care provider about genetic screening.  Women at a high risk of breast cancer should talk to their health care providers about having an MRI and a mammogram every year.   -Breast cancer gene (BRCA)-related cancer risk assessment is recommended for women who have family members with BRCA-related cancers. BRCA-related cancers include breast, ovarian, tubal, and peritoneal cancers. Having family members with these cancers may be associated with an increased risk for harmful changes (mutations) in the breast cancer genes BRCA1 and  BRCA2. Results of the assessment will determine the need for genetic counseling and BRCA1 and BRCA2 testing.   The Pap test is a screening test for cervical cancer. A Pap test can show cell changes on the cervix that might become cervical cancer if left untreated. A Pap test is a procedure in which cells are obtained and examined from the lower end of the uterus (cervix).   - Women should have a Pap test starting at age 57.   - Between ages 90 and 70, Pap tests should be repeated every 2 years.   - Beginning at age 63, you should have a Pap test every 3 years as long as the past 3 Pap tests have been normal.   - Some women have medical problems that increase the chance of getting cervical cancer. Talk to your health care provider about these problems. It is especially important to talk to your health care provider if a  new problem develops soon after your last Pap test. In these cases, your health care provider may recommend more frequent screening and Pap tests.   - The above recommendations are the same for women who have or have not gotten the vaccine for human papillomavirus (HPV).   - If you had a hysterectomy for a problem that was not cancer or a condition that could lead to cancer, then you no longer need Pap tests. Even if you no longer need a Pap test, a regular exam is a good idea to make sure no other problems are starting.   - If you are between ages 36 and 66 years, and you have had normal Pap tests going back 10 years, you no longer need Pap tests. Even if you no longer need a Pap test, a regular exam is a good idea to make sure no other problems are starting.   - If you have had past treatment for cervical cancer or a condition that could lead to cancer, you need Pap tests and screening for cancer for at least 20 years after your treatment.   - If Pap tests have been discontinued, risk factors (such as a new sexual partner) need to be reassessed to determine if screening should  be resumed.   - The HPV test is an additional test that may be used for cervical cancer screening. The HPV test looks for the virus that can cause the cell changes on the cervix. The cells collected during the Pap test can be tested for HPV. The HPV test could be used to screen women aged 70 years and older, and should be used in women of any age who have unclear Pap test results. After the age of 67, women should have HPV testing at the same frequency as a Pap test.   Colorectal cancer can be detected and often prevented. Most routine colorectal cancer screening begins at the age of 57 years and continues through age 26 years. However, your health care provider may recommend screening at an earlier age if you have risk factors for colon cancer. On a yearly basis, your health care provider may provide home test kits to check for hidden blood in the stool.  Use of a small camera at the end of a tube, to directly examine the colon (sigmoidoscopy or colonoscopy), can detect the earliest forms of colorectal cancer. Talk to your health care provider about this at age 23, when routine screening begins. Direct exam of the colon should be repeated every 5 -10 years through age 49 years, unless early forms of pre-cancerous polyps or small growths are found.   People who are at an increased risk for hepatitis B should be screened for this virus. You are considered at high risk for hepatitis B if:  -You were born in a country where hepatitis B occurs often. Talk with your health care provider about which countries are considered high risk.  - Your parents were born in a high-risk country and you have not received a shot to protect against hepatitis B (hepatitis B vaccine).  - You have HIV or AIDS.  - You use needles to inject street drugs.  - You live with, or have sex with, someone who has Hepatitis B.  - You get hemodialysis treatment.  - You take certain medicines for conditions like cancer, organ  transplantation, and autoimmune conditions.   Hepatitis C blood testing is recommended for all people born from [birth year] through 1965 and any individual  with known risks for hepatitis C.   Practice safe sex. Use condoms and avoid high-risk sexual practices to reduce the spread of sexually transmitted infections (STIs). STIs include gonorrhea, chlamydia, syphilis, trichomonas, herpes, HPV, and human immunodeficiency virus (HIV). Herpes, HIV, and HPV are viral illnesses that have no cure. They can result in disability, cancer, and death. Sexually active women aged 25 years and younger should be checked for chlamydia. Older women with new or multiple partners should also be tested for chlamydia. Testing for other STIs is recommended if you are sexually active and at increased risk.   Osteoporosis is a disease in which the bones lose minerals and strength with aging. This can result in serious bone fractures or breaks. The risk of osteoporosis can be identified using a bone density scan. Women ages 65 years and over and women at risk for fractures or osteoporosis should discuss screening with their health care providers. Ask your health care provider whether you should take a calcium supplement or vitamin D to There are also several preventive steps women can take to avoid osteoporosis and resulting fractures or to keep osteoporosis from worsening. -->Recommendations include:  Eat a balanced diet high in fruits, vegetables, calcium, and vitamins.  Get enough calcium. The recommended total intake of is 1,200 mg daily; for best absorption, if taking supplements, divide doses into 250-500 mg doses throughout the day. Of the two types of calcium, calcium carbonate is best absorbed when taken with food but calcium citrate can be taken on an empty stomach.  Get enough vitamin D. NAMS and the National Osteoporosis Foundation recommend at least 1,000 IU per day for women age 50 and over who are at risk of vitamin D  deficiency. Vitamin D deficiency can be caused by inadequate sun exposure (for example, those who live in northern latitudes).  Avoid alcohol and smoking. Heavy alcohol intake (more than 7 drinks per week) increases the risk of falls and hip fracture and women smokers tend to lose bone more rapidly and have lower bone mass than nonsmokers. Stopping smoking is one of the most important changes women can make to improve their health and decrease risk for disease.  Be physically active every day. Weight-bearing exercise (for example, fast walking, hiking, jogging, and weight training) may strengthen bones or slow the rate of bone loss that comes with aging. Balancing and muscle-strengthening exercises can reduce the risk of falling and fracture.  Consider therapeutic medications. Currently, several types of effective drugs are available. Healthcare providers can recommend the type most appropriate for each woman.  Eliminate environmental factors that may contribute to accidents. Falls cause nearly 90% of all osteoporotic fractures, so reducing this risk is an important bone-health strategy. Measures include ample lighting, removing obstructions to walking, using nonskid rugs on floors, and placing mats and/or grab bars in showers.  Be aware of medication side effects. Some common medicines make bones weaker. These include a type of steroid drug called glucocorticoids used for arthritis and asthma, some antiseizure drugs, certain sleeping pills, treatments for endometriosis, and some cancer drugs. An overactive thyroid gland or using too much thyroid hormone for an underactive thyroid can also be a problem. If you are taking these medicines, talk to your doctor about what you can do to help protect your bones.reduce the rate of osteoporosis.    Menopause can be associated with physical symptoms and risks. Hormone replacement therapy is available to decrease symptoms and risks. You should talk to your  health care provider   about whether hormone replacement therapy is right for you.   Use sunscreen. Apply sunscreen liberally and repeatedly throughout the day. You should seek shade when your shadow is shorter than you. Protect yourself by wearing long sleeves, pants, a wide-brimmed hat, and sunglasses year round, whenever you are outdoors.   Once a month, do a whole body skin exam, using a mirror to look at the skin on your back. Tell your health care provider of new moles, moles that have irregular borders, moles that are larger than a pencil eraser, or moles that have changed in shape or color.   -Stay current with required vaccines (immunizations).   Influenza vaccine. All adults should be immunized every year.  Tetanus, diphtheria, and acellular pertussis (Td, Tdap) vaccine. Pregnant women should receive 1 dose of Tdap vaccine during each pregnancy. The dose should be obtained regardless of the length of time since the last dose. Immunization is preferred during the 27th 36th week of gestation. An adult who has not previously received Tdap or who does not know her vaccine status should receive 1 dose of Tdap. This initial dose should be followed by tetanus and diphtheria toxoids (Td) booster doses every 10 years. Adults with an unknown or incomplete history of completing a 3-dose immunization series with Td-containing vaccines should begin or complete a primary immunization series including a Tdap dose. Adults should receive a Td booster every 10 years.  Varicella vaccine. An adult without evidence of immunity to varicella should receive 2 doses or a second dose if she has previously received 1 dose. Pregnant females who do not have evidence of immunity should receive the first dose after pregnancy. This first dose should be obtained before leaving the health care facility. The second dose should be obtained 4 8 weeks after the first dose.  Human papillomavirus (HPV) vaccine. Females aged 13 26  years who have not received the vaccine previously should obtain the 3-dose series. The vaccine is not recommended for use in pregnant females. However, pregnancy testing is not needed before receiving a dose. If a female is found to be pregnant after receiving a dose, no treatment is needed. In that case, the remaining doses should be delayed until after the pregnancy. Immunization is recommended for any person with an immunocompromised condition through the age of 26 years if she did not get any or all doses earlier. During the 3-dose series, the second dose should be obtained 4 8 weeks after the first dose. The third dose should be obtained 24 weeks after the first dose and 16 weeks after the second dose.  Zoster vaccine. One dose is recommended for adults aged 60 years or older unless certain conditions are present.  Measles, mumps, and rubella (MMR) vaccine. Adults born before 1957 generally are considered immune to measles and mumps. Adults born in 1957 or later should have 1 or more doses of MMR vaccine unless there is a contraindication to the vaccine or there is laboratory evidence of immunity to each of the three diseases. A routine second dose of MMR vaccine should be obtained at least 28 days after the first dose for students attending postsecondary schools, health care workers, or international travelers. People who received inactivated measles vaccine or an unknown type of measles vaccine during 1963 1967 should receive 2 doses of MMR vaccine. People who received inactivated mumps vaccine or an unknown type of mumps vaccine before 1979 and are at high risk for mumps infection should consider immunization with 2 doses of   MMR vaccine. For females of childbearing age, rubella immunity should be determined. If there is no evidence of immunity, females who are not pregnant should be vaccinated. If there is no evidence of immunity, females who are pregnant should delay immunization until after pregnancy.  Unvaccinated health care workers born before [birth year] who lack laboratory evidence of measles, mumps, or rubella immunity or laboratory confirmation of disease should consider measles and mumps immunization with 2 doses of MMR vaccine or rubella immunization with 1 dose of MMR vaccine.  Pneumococcal 13-valent conjugate (PCV13) vaccine. When indicated, a person who is uncertain of her immunization history and has no record of immunization should receive the PCV13 vaccine. An adult aged 54 years or older who has certain medical conditions and has not been previously immunized should receive 1 dose of PCV13 vaccine. This PCV13 should be followed with a dose of pneumococcal polysaccharide (PPSV23) vaccine. The PPSV23 vaccine dose should be obtained at least 8 weeks after the dose of PCV13 vaccine. An adult aged 58 years or older who has certain medical conditions and previously received 1 or more doses of PPSV23 vaccine should receive 1 dose of PCV13. The PCV13 vaccine dose should be obtained 1 or more years after the last PPSV23 vaccine dose.  Pneumococcal polysaccharide (PPSV23) vaccine. When PCV13 is also indicated, PCV13 should be obtained first. All adults aged 58 years and older should be immunized. An adult younger than age 65 years who has certain medical conditions should be immunized. Any person who resides in a nursing home or long-term care facility should be immunized. An adult smoker should be immunized. People with an immunocompromised condition and certain other conditions should receive both PCV13 and PPSV23 vaccines. People with human immunodeficiency virus (HIV) infection should be immunized as soon as possible after diagnosis. Immunization during chemotherapy or radiation therapy should be avoided. Routine use of PPSV23 vaccine is not recommended for American Indians, [place] Natives, or people younger than 65 years unless there are medical conditions that require PPSV23 vaccine. When indicated,  people who have unknown immunization and have no record of immunization should receive PPSV23 vaccine. One-time revaccination 5 years after the first dose of PPSV23 is recommended for people aged 70 64 years who have chronic kidney failure, nephrotic syndrome, asplenia, or immunocompromised conditions. People who received 1 2 doses of PPSV23 before age 32 years should receive another dose of PPSV23 vaccine at age 96 years or later if at least 5 years have passed since the previous dose. Doses of PPSV23 are not needed for people immunized with PPSV23 at or after age 55 years.  Meningococcal vaccine. Adults with asplenia or persistent complement component deficiencies should receive 2 doses of quadrivalent meningococcal conjugate (MenACWY-D) vaccine. The doses should be obtained at least 2 months apart. Microbiologists working with certain meningococcal bacteria, [employer] recruits, people at risk during an outbreak, and people who travel to or live in countries with a high rate of meningitis should be immunized. A first-year college student up through age 58 years who is living in a residence hall should receive a dose if she did not receive a dose on or after her 16th birthday. Adults who have certain high-risk conditions should receive one or more doses of vaccine.  Hepatitis A vaccine. Adults who wish to be protected from this disease, have certain high-risk conditions, work with hepatitis A-infected animals, work in hepatitis A research labs, or travel to or work in countries with a high rate of hepatitis A should be  immunized. Adults who were previously unvaccinated and who anticipate close contact with an international adoptee during the first 60 days after arrival in the [place] from a country with a high rate of hepatitis A should be immunized.  Hepatitis B vaccine.  Adults who wish to be protected from this disease, have certain high-risk conditions, may be exposed to blood or other infectious  body fluids, are household contacts or sex partners of hepatitis B positive people, are clients or workers in certain care facilities, or travel to or work in countries with a high rate of hepatitis B should be immunized.  Haemophilus influenzae type b (Hib) vaccine. A previously unvaccinated person with asplenia or sickle cell disease or having a scheduled splenectomy should receive 1 dose of Hib vaccine. Regardless of previous immunization, a recipient of a hematopoietic stem cell transplant should receive a 3-dose series 6 12 months after her successful transplant. Hib vaccine is not recommended for adults with HIV infection.  Preventive Services / Frequency Ages 6 to 39years  Blood pressure check.** / Every 1 to 2 years.  Lipid and cholesterol check.** / Every 5 years beginning at age 39.  Clinical breast exam.** / Every 3 years for women in their 61s and 62s.  BRCA-related cancer risk assessment.** / For women who have family members with a BRCA-related cancer (breast, ovarian, tubal, or peritoneal cancers).  Pap test.** / Every 2 years from ages 47 through 85. Every 3 years starting at age 34 through age 12 or 74 with a history of 3 consecutive normal Pap tests.  HPV screening.** / Every 3 years from ages 46 through ages 43 to 54 with a history of 3 consecutive normal Pap tests.  Hepatitis C blood test.** / For any individual with known risks for hepatitis C.  Skin self-exam. / Monthly.  Influenza vaccine. / Every year.  Tetanus, diphtheria, and acellular pertussis (Tdap, Td) vaccine.** / Consult your health care provider. Pregnant women should receive 1 dose of Tdap vaccine during each pregnancy. 1 dose of Td every 10 years.  Varicella vaccine.** / Consult your health care provider. Pregnant females who do not have evidence of immunity should receive the first dose after pregnancy.  HPV vaccine. / 3 doses over 6 months, if 64 and younger. The vaccine is not recommended for use in  pregnant females. However, pregnancy testing is not needed before receiving a dose.  Measles, mumps, rubella (MMR) vaccine.** / You need at least 1 dose of MMR if you were born in 1957 or later. You may also need a 2nd dose. For females of childbearing age, rubella immunity should be determined. If there is no evidence of immunity, females who are not pregnant should be vaccinated. If there is no evidence of immunity, females who are pregnant should delay immunization until after pregnancy.  Pneumococcal 13-valent conjugate (PCV13) vaccine.** / Consult your health care provider.  Pneumococcal polysaccharide (PPSV23) vaccine.** / 1 to 2 doses if you smoke cigarettes or if you have certain conditions.  Meningococcal vaccine.** / 1 dose if you are age 71 to 37 years and a Market researcher living in a residence hall, or have one of several medical conditions, you need to get vaccinated against meningococcal disease. You may also need additional booster doses.  Hepatitis A vaccine.** / Consult your health care provider.  Hepatitis B vaccine.** / Consult your health care provider.  Haemophilus influenzae type b (Hib) vaccine.** / Consult your health care provider.  Ages 55 to 64years  Blood pressure check.** / Every 1 to 2 years.  Lipid and cholesterol check.** / Every 5 years beginning at age 20 years.  Lung cancer screening. / Every year if you are aged 55 80 years and have a 30-pack-year history of smoking and currently smoke or have quit within the past 15 years. Yearly screening is stopped once you have quit smoking for at least 15 years or develop a health problem that would prevent you from having lung cancer treatment.  Clinical breast exam.** / Every year after age 40 years.  BRCA-related cancer risk assessment.** / For women who have family members with a BRCA-related cancer (breast, ovarian, tubal, or peritoneal cancers).  Mammogram.** / Every year beginning at age 40  years and continuing for as long as you are in good health. Consult with your health care provider.  Pap test.** / Every 3 years starting at age 30 years through age 65 or 70 years with a history of 3 consecutive normal Pap tests.  HPV screening.** / Every 3 years from ages 30 years through ages 65 to 70 years with a history of 3 consecutive normal Pap tests.  Fecal occult blood test (FOBT) of stool. / Every year beginning at age 50 years and continuing until age 75 years. You may not need to do this test if you get a colonoscopy every 10 years.  Flexible sigmoidoscopy or colonoscopy.** / Every 5 years for a flexible sigmoidoscopy or every 10 years for a colonoscopy beginning at age 50 years and continuing until age 75 years.  Hepatitis C blood test.** / For all people born from 1945 through 1965 and any individual with known risks for hepatitis C.  Skin self-exam. / Monthly.  Influenza vaccine. / Every year.  Tetanus, diphtheria, and acellular pertussis (Tdap/Td) vaccine.** / Consult your health care provider. Pregnant women should receive 1 dose of Tdap vaccine during each pregnancy. 1 dose of Td every 10 years.  Varicella vaccine.** / Consult your health care provider. Pregnant females who do not have evidence of immunity should receive the first dose after pregnancy.  Zoster vaccine.** / 1 dose for adults aged 60 years or older.  Measles, mumps, rubella (MMR) vaccine.** / You need at least 1 dose of MMR if you were born in 1957 or later. You may also need a 2nd dose. For females of childbearing age, rubella immunity should be determined. If there is no evidence of immunity, females who are not pregnant should be vaccinated. If there is no evidence of immunity, females who are pregnant should delay immunization until after pregnancy.  Pneumococcal 13-valent conjugate (PCV13) vaccine.** / Consult your health care provider.  Pneumococcal polysaccharide (PPSV23) vaccine.** / 1 to 2 doses if  you smoke cigarettes or if you have certain conditions.  Meningococcal vaccine.** / Consult your health care provider.  Hepatitis A vaccine.** / Consult your health care provider.  Hepatitis B vaccine.** / Consult your health care provider.  Haemophilus influenzae type b (Hib) vaccine.** / Consult your health care provider.  Ages 65 years and over  Blood pressure check.** / Every 1 to 2 years.  Lipid and cholesterol check.** / Every 5 years beginning at age 20 years.  Lung cancer screening. / Every year if you are aged 55 80 years and have a 30-pack-year history of smoking and currently smoke or have quit within the past 15 years. Yearly screening is stopped once you have quit smoking for at least 15 years or develop a health problem that   would prevent you from having lung cancer treatment.  Clinical breast exam.** / Every year after age 103 years.  BRCA-related cancer risk assessment.** / For women who have family members with a BRCA-related cancer (breast, ovarian, tubal, or peritoneal cancers).  Mammogram.** / Every year beginning at age 36 years and continuing for as long as you are in good health. Consult with your health care provider.  Pap test.** / Every 3 years starting at age 5 years through age 85 or 10 years with 3 consecutive normal Pap tests. Testing can be stopped between 65 and 70 years with 3 consecutive normal Pap tests and no abnormal Pap or HPV tests in the past 10 years.  HPV screening.** / Every 3 years from ages 93 years through ages 70 or 45 years with a history of 3 consecutive normal Pap tests. Testing can be stopped between 65 and 70 years with 3 consecutive normal Pap tests and no abnormal Pap or HPV tests in the past 10 years.  Fecal occult blood test (FOBT) of stool. / Every year beginning at age 8 years and continuing until age 45 years. You may not need to do this test if you get a colonoscopy every 10 years.  Flexible sigmoidoscopy or colonoscopy.** /  Every 5 years for a flexible sigmoidoscopy or every 10 years for a colonoscopy beginning at age 69 years and continuing until age 68 years.  Hepatitis C blood test.** / For all people born from [birth year] through 1965 and any individual with known risks for hepatitis C.  Osteoporosis screening.** / A one-time screening for women ages 7 years and over and women at risk for fractures or osteoporosis.  Skin self-exam. / Monthly.  Influenza vaccine. / Every year.  Tetanus, diphtheria, and acellular pertussis (Tdap/Td) vaccine.** / 1 dose of Td every 10 years.  Varicella vaccine.** / Consult your health care provider.  Zoster vaccine.** / 1 dose for adults aged 5 years or older.  Pneumococcal 13-valent conjugate (PCV13) vaccine.** / Consult your health care provider.  Pneumococcal polysaccharide (PPSV23) vaccine.** / 1 dose for all adults aged 74 years and older.  Meningococcal vaccine.** / Consult your health care provider.  Hepatitis A vaccine.** / Consult your health care provider.  Hepatitis B vaccine.** / Consult your health care provider.  Haemophilus influenzae type b (Hib) vaccine.** / Consult your health care provider. ** Family history and personal history of risk and conditions may change your health care provider's recommendations. Document Released: 06/19/2001 Document Revised: 02/11/2013  [facility] Patient Information 2014 [place], [place].   EXERCISE AND DIET:  We recommended that you start or continue a regular exercise program for good health. Regular exercise means any activity that makes your heart beat faster and makes you sweat.  We recommend exercising at least 30 minutes per day at least 3 days a week, preferably 5.  We also recommend a diet low in fat and sugar / carbohydrates.  Inactivity, poor dietary choices and obesity can cause diabetes, heart attack, stroke, and kidney damage, among others.     ALCOHOL AND SMOKING:  Women should limit their alcohol intake to no  more than 7 drinks/beers/glasses of wine (combined, not each!) per week. Moderation of alcohol intake to this level decreases your risk of breast cancer and liver damage.  ( And of course, no recreational drugs are part of a healthy lifestyle.)  Also, you should not be smoking at all or even being exposed to second hand smoke. Most people know smoking can  cause cancer, and various heart and lung diseases, but did you know it also contributes to weakening of your bones?  Aging of your skin?  Yellowing of your teeth and nails?   CALCIUM AND VITAMIN D:  Adequate intake of calcium and Vitamin D are recommended.  The recommendations for exact amounts of these supplements seem to change often, but generally speaking 600 mg of calcium (either carbonate or citrate) and 800 units of Vitamin D per day seems prudent. Certain women may benefit from higher intake of Vitamin D.  If you are among these women, your doctor will have told you during your visit.     PAP SMEARS:  Pap smears, to check for cervical cancer or precancers,  have traditionally been done yearly, although recent scientific advances have shown that most women can have pap smears less often.  However, every woman still should have a physical exam from her gynecologist or primary care physician every year. It will include a breast check, inspection of the vulva and vagina to check for abnormal growths or skin changes, a visual exam of the cervix, and then an exam to evaluate the size and shape of the uterus and ovaries.  And after 38 years of age, a rectal exam is indicated to check for rectal cancers. We will also provide age appropriate advice regarding health maintenance, like when you should have certain vaccines, screening for sexually transmitted diseases, bone density testing, colonoscopy, mammograms, etc.    MAMMOGRAMS:  All women over 53 years old should have a yearly mammogram. Many facilities now offer a "3D" mammogram, which may cost  around $50 extra out of pocket. If possible,  we recommend you accept the option to have the 3D mammogram performed.  It both reduces the number of women who will be called back for extra views which then turn out to be normal, and it is better than the routine mammogram at detecting truly abnormal areas.     COLONOSCOPY:  Colonoscopy to screen for colon cancer is recommended for all women at age 48.  We know, you hate the idea of the prep.  We agree, BUT, having colon cancer and not knowing it is worse!!  Colon cancer so often starts as a polyp that can be seen and removed at colonscopy, which can quite literally save your life!  And if your first colonoscopy is normal and you have no family history of colon cancer, most women don't have to have it again for 10 years.  Once every ten years, you can do something that may end up saving your life, right?  We will be happy to help you get it scheduled when you are ready.  Be sure to check your insurance coverage so you understand how much it will cost.  It may be covered as a preventative service at no cost, but you should check your particular policy.    Overall labs look very good, with elevation in LDL (bad) cholesterol. Reduce saturated fat and continue your excellent exercise regime. When you come back for follow-up in 6 months, please come fasting so we can re-check cholesterol panel. We will call you when all STI results are in- avoid sexual contact until you are called. Continue to social distance and wear a mask when in public. Follow-up 6 months. [employer]!!!

## 2019-01-16 LAB — RPR: RPR Ser Ql: NONREACTIVE

## 2019-01-16 LAB — HIV ANTIBODY (ROUTINE TESTING W REFLEX): HIV Screen 4th Generation wRfx: NONREACTIVE

## 2019-01-19 ENCOUNTER — Encounter: Payer: Self-pay | Admitting: Adult Health

## 2019-01-19 LAB — GC/CHLAMYDIA PROBE AMP
Chlamydia trachomatis, NAA: NEGATIVE
Neisseria Gonorrhoeae by PCR: NEGATIVE

## 2019-01-19 LAB — NUSWAB VAGINITIS PLUS (VG+)
Candida albicans, NAA: NEGATIVE
Candida glabrata, NAA: NEGATIVE
Chlamydia trachomatis, NAA: NEGATIVE
Neisseria gonorrhoeae, NAA: NEGATIVE
Trich vag by NAA: NEGATIVE

## 2019-03-23 ENCOUNTER — Encounter: Payer: Self-pay | Admitting: Certified Nurse Midwife

## 2019-03-23 ENCOUNTER — Ambulatory Visit (INDEPENDENT_AMBULATORY_CARE_PROVIDER_SITE_OTHER): Payer: No Typology Code available for payment source | Admitting: Certified Nurse Midwife

## 2019-03-23 ENCOUNTER — Other Ambulatory Visit: Payer: Self-pay

## 2019-03-23 VITALS — BP 110/80 | HR 70 | Temp 97.1°F | Resp 16 | Wt 237.0 lb

## 2019-03-23 DIAGNOSIS — Z113 Encounter for screening for infections with a predominantly sexual mode of transmission: Secondary | ICD-10-CM | POA: Diagnosis not present

## 2019-03-23 DIAGNOSIS — N898 Other specified noninflammatory disorders of vagina: Secondary | ICD-10-CM

## 2019-03-23 NOTE — Progress Notes (Signed)
38 y.o. Married Caucasian female [phone] here with complaint of vaginal symptoms of itching, burning, and increase thick white discharge. Has noted some redness around external tissue. Onset of symptoms 5 days ago. Denies new personal products or vaginal dryness. Treated self with Monistat 3 day with no change. Discharge has increased since use. Has been using Summers' Eve for freshness. Concerned with trust issues. Desires STD screening. Urinary symptoms none . Contraception is Mirena IUD, working well. No other health issues. Continues to work on weight loss!  Review of Systems  Constitutional: Negative.   HENT: Negative.   Eyes: Negative.   Respiratory: Negative.   Cardiovascular: Negative.   Gastrointestinal: Negative.   Genitourinary: Negative.   Musculoskeletal: Negative.   Skin: Positive for itching.       Redness, burning, some blood, thick discharge  Neurological: Negative.   Endo/Heme/Allergies: Negative.   Psychiatric/Behavioral: Negative.     O:Healthy female WDWN Affect: normal, orientation x 3  Exam: Skin: warm and dry Abdomen:soft, non tender Lymph node: no enlargement or tenderness Pelvic exam: External genital: normal female BUS: negative Vagina: white/beige slightly thick discharge noted. Affirm taken Cervix: normal, non tender, no CMT, IUD string noted in cervix Uterus: normal, non tender Adnexa:normal, non tender, no masses or fullness noted Rectal area: normal appearance   A:Normal pelvic exam R/O vaginal infection Contraception Mirena IUD   P:Discussed findings of beige discharge, slight odor and with possible BV etiology. Discussed Aveeno or baking soda sitz bath for comfort. Discussed 1% hydrocortisone topically for comfort after sitz bath. Will treat once labs in. Questions addressed. Encouraged to wear loose underwear for comfort.  Labs; Gc/chlamydia, Affirm  Rv prn

## 2019-03-24 ENCOUNTER — Other Ambulatory Visit: Payer: Self-pay

## 2019-03-24 LAB — VAGINITIS/VAGINOSIS, DNA PROBE
Candida Species: POSITIVE — AB
Gardnerella vaginalis: NEGATIVE
Trichomonas vaginosis: NEGATIVE

## 2019-03-24 MED ORDER — FLUCONAZOLE 150 MG PO TABS: ORAL_TABLET | ORAL | 0 refills | Status: DC

## 2019-03-24 MED FILL — FLUCONAZOLE 150 MG TABS: 150 | 5 days supply | Qty: 2 | Fill #0

## 2019-03-26 LAB — GC/CHLAMYDIA PROBE AMP
Chlamydia trachomatis, NAA: NEGATIVE
Neisseria Gonorrhoeae by PCR: NEGATIVE

## 2019-04-26 ENCOUNTER — Other Ambulatory Visit: Payer: Self-pay

## 2019-04-27 MED ORDER — FLUCONAZOLE 150 MG PO TABS: ORAL_TABLET | ORAL | 0 refills | Status: DC

## 2019-04-27 MED FILL — FLUCONAZOLE 150 MG TABS: 150 | 2 days supply | Qty: 2 | Fill #0

## 2019-04-27 NOTE — Telephone Encounter (Signed)
Patient returning call to [place]. States she would also like a refill on the boric acid suppositories.

## 2019-04-27 NOTE — Telephone Encounter (Signed)
Routing to Melvia Heaps, CNM to review request for boric acid vaginal suppositories.

## 2019-04-27 NOTE — Telephone Encounter (Signed)
Spoke with patient in regard to refill request received for diflucan. Patient reports clear white vaginal d/c, burning and some itching. Symptoms started 2 days ago. Started OTC monistat 3 day on 12/20, providing little relief. Patient was seen in office on 03/23/19, affirm positive for yeast, tx with diflucan, symptoms resolved.   Patient states monistat did not work previously, Chemical engineer for diflucan. Patient is aware OV recommended for further evaluation, request to review with provider.   Melvia Heaps, CNM -please advise on Rx.

## 2019-04-27 NOTE — Telephone Encounter (Signed)
Regina Eck, CNM  Burnice Logan, RN  Will renew Diflucan only at this point    Patient notified. Patient verbalizes understanding and is agreeable.

## 2019-05-07 ENCOUNTER — Encounter: Payer: Self-pay | Admitting: Obstetrics & Gynecology

## 2019-05-07 ENCOUNTER — Telehealth: Payer: Self-pay | Admitting: Certified Nurse Midwife

## 2019-05-07 ENCOUNTER — Ambulatory Visit (INDEPENDENT_AMBULATORY_CARE_PROVIDER_SITE_OTHER): Payer: No Typology Code available for payment source | Admitting: Obstetrics & Gynecology

## 2019-05-07 ENCOUNTER — Encounter: Payer: Self-pay | Admitting: Adult Health

## 2019-05-07 VITALS — BP 116/72 | HR 72 | Temp 98.4°F | Resp 12 | Ht 68.25 in | Wt 253.4 lb

## 2019-05-07 DIAGNOSIS — Z9884 Bariatric surgery status: Secondary | ICD-10-CM | POA: Diagnosis not present

## 2019-05-07 DIAGNOSIS — Z6838 Body mass index (BMI) 38.0-38.9, adult: Secondary | ICD-10-CM | POA: Diagnosis not present

## 2019-05-07 DIAGNOSIS — R3915 Urgency of urination: Secondary | ICD-10-CM | POA: Diagnosis not present

## 2019-05-07 LAB — POCT URINALYSIS DIPSTICK
Bilirubin, UA: NEGATIVE
Glucose, UA: NEGATIVE
Ketones, UA: NEGATIVE
Nitrite, UA: NEGATIVE
Protein, UA: NEGATIVE
Urobilinogen, UA: 0.2 E.U./dL
pH, UA: 6 (ref 5.0–8.0)

## 2019-05-07 MED ORDER — SULFAMETHOXAZOLE-TRIMETHOPRIM 800-160 MG PO TABS: 1.0000 | ORAL_TABLET | Freq: Two times a day (BID) | ORAL | 0 refills | Status: DC

## 2019-05-07 MED FILL — SULFAMETHOXAZOLE-TMP DS TAB: 800-160 | 3 days supply | Qty: 6 | Fill #0

## 2019-05-07 NOTE — Telephone Encounter (Addendum)
Spoke to pt. Pt states having UTI sx of urgency, frequency, fullness, burning and has odor x 2 days now. Pt willing to come for appt today. Scheduled OV for 12/31 at 3:30pm with Dr Sabra Heck in [place], CNM absence. Pt agreeable and aware. CPS negative.  Will route to Dr Sabra Heck for review and will close encounter.

## 2019-05-07 NOTE — Telephone Encounter (Signed)
Jayle, [facility]  Phone Number: [phone]  I think I have a UTI and was hoping an antibiotic can be called into the pharmacy. Symptoms began yesterday and include increased urgency and frequency to urinate, feels like bladder is not completely emptying, rotten egg odor, and a slight burning.

## 2019-05-07 NOTE — Progress Notes (Signed)
GYNECOLOGY  VISIT  CC:   Patient complains of having urinary urgency, frequency, not emptying completely, and odor  HPI: 38 y.o. [id number] Married White or Caucasian female here for increased urinary urgency and increased urinary frequency.  She is having some burning with urination.  She has noticed increased odor in her urine.  Denies pelvic pain or fever.  She does not have any new low back pain.  Denies vaginal discharge.    She is working on weight loss again.    GYNECOLOGIC HISTORY: No LMP recorded. (Menstrual status: IUD). Contraception: Mirena IUD Menopausal hormone therapy: n/a  Patient Active Problem List   Diagnosis Date Noted  . Candidiasis, cutaneous 07/14/2018  . Flu-like symptoms 07/09/2018  . Pharyngitis 07/09/2018  . GAD (generalized anxiety disorder) 06/02/2018  . Dysuria 03/27/2018  . Family history of Parkinsonism 03/27/2018  . Dizziness 03/27/2018  . Bradycardia 03/23/2018  . HLD (hyperlipidemia) 03/23/2018  . Coronary atherosclerosis 03/23/2018  . Chest pain 03/22/2018  . Varicose vein of leg 12/24/2017  . Venous reflux 12/24/2017  . Leukocytosis 09/29/2017  . Nausea 09/29/2017  . Anxiety 09/29/2017  . Pancreatitis 09/28/2017  . Dehydration 03/12/2017  . 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 Sleep apnea 01/09/2017  . Healthcare maintenance 10/18/2016  . LOW BACK PAIN, ACUTE 11/20/2006  . POLYCYSTIC OVARY 07/04/2006  . OBESITY, NOS 07/04/2006  . TOBACCO USE, QUIT 07/04/2006    Past Medical History:  Diagnosis Date  . Abnormal uterine bleeding   . Amenorrhea   . Anemia   . Anxiety    panic attacks  . Bradycardia   . Diabetes mellitus without complication ([place])    type 2 ---5/26 no loner in pre diabetic range since bariatric sx 6 months ago  . Dysmenorrhea   . GERD (gastroesophageal reflux disease)    occasional - diet controlled and tums prn  . Hyperlipidemia    no meds -diet controlled  . Infertility, female   .  Insulin resistance   . Migraine without aura   . Missed abortion    x 2 - both resolved without surgery  . Obesity   . PCOS (polycystic ovarian syndrome)   . PCOS (polycystic ovarian syndrome)   . PONV (postoperative nausea and vomiting)    pe rpatient ; scopalamine patch very helpful   . Sleep apnea   . SVD (spontaneous vaginal delivery)    x 1  . Urinary incontinence     Past Surgical History:  Procedure Laterality Date  . CHOLECYSTECTOMY    . COLONOSCOPY  2017   polyps  . CYSTOSCOPY N/A 10/29/2016   Procedure: CYSTOSCOPY;  Surgeon: Salvadore Dom, MD;  Location: [facility];  Service: Gynecology;  Laterality: N/A;  . DILATATION & CURRETTAGE/HYSTEROSCOPY WITH RESECTOCOPE N/A 10/17/2012   Procedure: DILATATION & CURETTAGE/HYSTEROSCOPY WITH RESECTOCOPE; Possible Polypectomy, Possible Resectoscopic Myomectomy.;  Surgeon: Floyce Stakes. Pamala Hurry, MD;  Location: [facility];  Service: Gynecology;  Laterality: N/A;  1 hr.  Marland Kitchen DILATION AND CURETTAGE OF UTERUS    . ENDOMETRIAL BIOPSY    . ENDOVENOUS ABLATION SAPHENOUS VEIN W/ LASER Right 05/08/2018   endovenous laser ablation right greater saphenous vein and stab phlebectomy > 20 incisions right leg by Deitra Mayo MD   . ESOPHAGOGASTRODUODENOSCOPY ENDOSCOPY  2017   Normal  . HYSTEROSCOPY WITH D & C N/A 12/01/2013   Procedure: IUD Removal;  Surgeon: Floyce Stakes. Pamala Hurry, MD;  Location: [facility];  Service: Gynecology;  Laterality: N/A;  90 min.  Marland Kitchen  INTRAUTERINE DEVICE (IUD) INSERTION N/A 10/17/2012   Procedure: INTRAUTERINE DEVICE (IUD) INSERTION; Mirena;  Surgeon: Floyce Stakes. Pamala Hurry, MD;  Location: [facility];  Service: Gynecology;  Laterality: N/A;  . LAPAROSCOPIC GASTRIC SLEEVE RESECTION N/A 02/19/2017   Procedure: LAPAROSCOPIC GASTRIC SLEEVE RESECTION, UPPER ENDOSCOPY;  Surgeon: Johnathan Hausen, MD;  Location: [facility];  Service: General;  Laterality: N/A;  . LAPAROSCOPIC SALPINGO OOPHERECTOMY Left 10/29/2016   Procedure: LAPAROSCOPIC SALPINGO  OOPHORECTOMY With anterior uterine biopsy;  Surgeon: Salvadore Dom, MD;  Location: [facility];  Service: Gynecology;  Laterality: Left;  . LASIK  10/06/2018  . MOUTH SURGERY     wisdom teeth    MEDS:   Current Outpatient Medications on File Prior to Visit  Medication Sig Dispense Refill  . aspirin EC 81 MG EC tablet Take 1 tablet (81 mg total) by mouth daily.    . Biotin w/ Vitamins C & E (HAIR/SKIN/NAILS PO) Take by mouth.    . Calcium-Vitamin D-Vitamin K (CALCIUM SOFT CHEWS PO) Take by mouth daily.    . diazepam (VALIUM) 5 MG tablet Take 1 tablet (5 mg total) by mouth every 8 (eight) hours as needed for anxiety. 60 tablet 0  . ibuprofen (ADVIL,MOTRIN) 200 MG tablet Take 200 mg by mouth every 6 (six) hours as needed for headache or moderate pain.    . IRON PO Take 45 mg by mouth every other day.    . levonorgestrel (MIRENA) 20 MCG/24HR IUD 1 each by Intrauterine route once.    . loratadine (CLARITIN) 10 MG tablet Take 10 mg by mouth daily as needed for allergies.    . Multiple Vitamins-Iron (MULTIVITAMINS WITH IRON) TABS tablet Take 1 tablet by mouth 2 (two) times daily. CHEWABLE    . ondansetron (ZOFRAN) 8 MG tablet Take 1 tablet (8 mg total) by mouth every 8 (eight) hours as needed for nausea or vomiting. 20 tablet 0  . OVER THE COUNTER MEDICATION Take 500 mg by mouth 3 (three) times daily. Bariatric calcium 500mg      . Probiotic Product (PROBIOTIC ADVANCED PO) Take by mouth.     No current facility-administered medications on file prior to visit.    ALLERGIES: Iohexol  Family History  Problem Relation Age of Onset  . Diabetes Father   . Thyroid disease Father   . Hypertension Father   . Parkinson's disease Father   . Dementia Father   . Diabetes Maternal Grandmother   . Heart disease Maternal Grandmother   . Diabetes Maternal Grandfather   . Heart disease Maternal Grandfather   . Stroke Maternal Grandfather   . Diabetes Paternal Grandmother   . Diabetes Paternal  Grandfather   . Heart disease Paternal Grandfather   . Alcohol abuse Paternal Grandfather   . Hypertension Mother   . Diabetes Mother   . Colon cancer Maternal Uncle     SH:  Married, non smoker  Review of Systems  Genitourinary: Positive for frequency and urgency.       Odor in urine  All other systems reviewed and are negative.   PHYSICAL EXAMINATION:    BP 116/72 (BP Location: Right Arm, Patient Position: Sitting, Cuff Size: Large)   Pulse 72   Temp 98.4 F (36.9 C) (Temporal)   Resp 12   Ht 5' 8.25" (1.734 m)   Wt 253 lb 6.4 oz (114.9 kg)   BMI 38.25 kg/m     General appearance: alert, cooperative and appears stated age Abdomen: soft, non-tender; bowel sounds normal; no masses,  no organomegaly Lymph:  no inguinal LAD noted  Pelvic: External genitalia:  no lesions              Urethra:  normal appearing urethra with no masses, tenderness or lesions              Bartholins and Skenes: normal                 Vagina: normal appearing vagina with normal color and discharge, no lesions              Cervix: no lesions and IUD string noted              Bimanual Exam:  Uterus:  normal size, contour, position, consistency, mobility, non-tender              Adnexa: no mass, fullness, tenderness  Chaperone, Terence Lux, CMA, was present for exam.  Assessment: Cystitis Weight gain after bariatric surgery  Plan: Bactrim DS BID x 3 days. Urine culture pending Referral to Dr. Juleen China

## 2019-05-09 LAB — URINE CULTURE

## 2019-05-14 ENCOUNTER — Telehealth: Payer: Self-pay | Admitting: Obstetrics & Gynecology

## 2019-05-14 NOTE — Telephone Encounter (Signed)
Patient was seen 05/07/19 for a uti and now she has a yeast infection. She asked if Dr.Miller would call in a prescription to [facility].

## 2019-05-14 NOTE — Telephone Encounter (Signed)
Spoke with patient. Patient recently tx for  Ecoli UTI with Bactrim DS. Patient reports vaginal itching, burning and white vaginal d/c, no odor. Symptoms started on 1/6. Has not tried anything OTC. Recommended patient try OTC Monistat, if symptoms do not resolve, return call to office. Patient is agreeable to plan.   Routing to provider for final review. Patient is agreeable to disposition. Will close encounter.

## 2019-05-18 ENCOUNTER — Other Ambulatory Visit: Payer: Self-pay

## 2019-05-18 ENCOUNTER — Encounter: Payer: Self-pay | Admitting: Adult Health

## 2019-05-18 ENCOUNTER — Ambulatory Visit (INDEPENDENT_AMBULATORY_CARE_PROVIDER_SITE_OTHER): Payer: No Typology Code available for payment source | Admitting: Adult Health

## 2019-05-18 VITALS — BP 120/75 | HR 66 | Temp 98.1°F | Ht 68.25 in | Wt 257.3 lb

## 2019-05-18 DIAGNOSIS — M5441 Lumbago with sciatica, right side: Secondary | ICD-10-CM

## 2019-05-18 DIAGNOSIS — M5442 Lumbago with sciatica, left side: Secondary | ICD-10-CM

## 2019-05-18 MED ORDER — METHYLPREDNISOLONE ACETATE 80 MG/ML IJ SUSP
80.0000 mg | Freq: Once | INTRAMUSCULAR | Status: AC
  Administered 2019-05-18: 09:00:00 80 mg via INTRAMUSCULAR

## 2019-05-18 MED ORDER — CYCLOBENZAPRINE HCL 10 MG PO TABS: 10.0000 mg | ORAL_TABLET | Freq: Three times a day (TID) | ORAL | 0 refills | Status: DC | PRN

## 2019-05-18 MED ORDER — PREDNISONE 20 MG PO TABS: ORAL_TABLET | ORAL | 0 refills | Status: DC

## 2019-05-18 MED FILL — predniSONE 20 MG TABS: 20 | 8 days supply | Qty: 13 | Fill #0

## 2019-05-18 MED FILL — CYCLOBENZAPRINE HCL 10 MG T: 10 | 10 days supply | Qty: 30 | Fill #0

## 2019-05-18 NOTE — Assessment & Plan Note (Addendum)
Since you received steroid injection in clinic today- start oral prednisone TOMORROW 05/19/2019.  OTC Acetaminophen as needed. Cyclobenzaprine as needed for muscle spasm- do not drive or drink alcohol when taking. Referral to Physical Therapy placed. If pain is not improving in two weeks please follow-up for further evaluation, +/- imaging. Continue to social distance and wear a mask when in public.

## 2019-05-18 NOTE — Patient Instructions (Addendum)
Acute Back Pain, Adult Acute back pain is sudden and usually short-lived. It is often caused by an injury to the muscles and tissues in the back. The injury may result from:  A muscle or ligament getting overstretched or torn (strained). Ligaments are tissues that connect bones to each other. Lifting something improperly can cause a back strain.  Wear and tear (degeneration) of the spinal disks. Spinal disks are circular tissue that provides cushioning between the bones of the spine (vertebrae).  Twisting motions, such as while playing sports or doing yard work.  A hit to the back.  Arthritis. You may have a physical exam, lab tests, and imaging tests to find the cause of your pain. Acute back pain usually goes away with rest and home care. Follow these instructions at home: Managing pain, stiffness, and swelling  Take over-the-counter and prescription medicines only as told by your health care provider.  Your health care provider may recommend applying ice during the first 24-48 hours after your pain starts. To do this: ? Put ice in a plastic bag. ? Place a towel between your skin and the bag. ? Leave the ice on for 20 minutes, 2-3 times a day.  If directed, apply heat to the affected area as often as told by your health care provider. Use the heat source that your health care provider recommends, such as a moist heat pack or a heating pad. ? Place a towel between your skin and the heat source. ? Leave the heat on for 20-30 minutes. ? Remove the heat if your skin turns bright red. This is especially important if you are unable to feel pain, heat, or cold. You have a greater risk of getting burned. Activity   Do not stay in bed. Staying in bed for more than 1-2 days can delay your recovery.  Sit up and stand up straight. Avoid leaning forward when you sit, or hunching over when you stand. ? If you work at a desk, sit close to it so you do not need to lean over. Keep your chin tucked  in. Keep your neck drawn back, and keep your elbows bent at a right angle. Your arms should look like the letter "L." ? Sit high and close to the steering wheel when you drive. Add lower back (lumbar) support to your car seat, if needed.  Take short walks on even surfaces as soon as you are able. Try to increase the length of time you walk each day.  Do not sit, drive, or stand in one place for more than 30 minutes at a time. Sitting or standing for long periods of time can put stress on your back.  Do not drive or use heavy machinery while taking prescription pain medicine.  Use proper lifting techniques. When you bend and lift, use positions that put less stress on your back: ? Bend your knees. ? Keep the load close to your body. ? Avoid twisting.  Exercise regularly as told by your health care provider. Exercising helps your back heal faster and helps prevent back injuries by keeping muscles strong and flexible.  Work with a physical therapist to make a safe exercise program, as recommended by your health care provider. Do any exercises as told by your physical therapist. Lifestyle  Maintain a healthy weight. Extra weight puts stress on your back and makes it difficult to have good posture.  Avoid activities or situations that make you feel anxious or stressed. Stress and anxiety increase muscle   tension and can make back pain worse. Learn ways to manage anxiety and stress, such as through exercise. General instructions  Sleep on a firm mattress in a comfortable position. Try lying on your side with your knees slightly bent. If you lie on your back, put a pillow under your knees.  Follow your treatment plan as told by your health care provider. This may include: ? Cognitive or behavioral therapy. ? Acupuncture or massage therapy. ? Meditation or yoga. Contact a health care provider if:  You have pain that is not relieved with rest or medicine.  You have increasing pain going down  into your legs or buttocks.  Your pain does not improve after 2 weeks.  You have pain at night.  You lose weight without trying.  You have a fever or chills. Get help right away if:  You develop new bowel or bladder control problems.  You have unusual weakness or numbness in your arms or legs.  You develop nausea or vomiting.  You develop abdominal pain.  You feel faint. Summary  Acute back pain is sudden and usually short-lived.  Use proper lifting techniques. When you bend and lift, use positions that put less stress on your back.  Take over-the-counter and prescription medicines and apply heat or ice as directed by your health care provider. This information is not intended to replace advice given to you by your health care provider. Make sure you discuss any questions you have with your health care provider. Document Revised: 08/12/2018 Document Reviewed: 12/05/2016 Elsevier Patient Education  [employer].   Since you received steroid injection in clinic today- start oral prednisone TOMORROW 05/19/2019.  OTC Acetaminophen as needed. Cyclobenzaprine as needed for muscle spasm- do not drive or drink alcohol when taking. Referral to Physical Therapy placed. If pain is not improving in two weeks please follow-up for further evaluation, +/- imaging. Continue to social distance and wear a mask when in public. FEEL BETTER!

## 2019-05-18 NOTE — Progress Notes (Addendum)
Subjective:    [name], female    DOB: 12/29/1980, 39 y.o.   MRN: [id number]  HPI:  [name] [name] is here for acute lumbar back pain with L side sciatica. She was exercising last Wed, 05/13/2019- she went to perform "lateral pull" and felt/heard "pop" in low back. She felt "slight initial discomfort" that has gradually been worsening. Pain is now constant with intermittent intense spasms. The constant pain as described as "dull", rated 4/1-, spasms as described as "sharp", rated 8/10. She has been using a heating pain and OTC Acetaminophen and Ibuprofen with min sx relief. She denies bowel/bladder disfunctin. She reports mild, intermittent tingling of L toes. She reports falling off the commode after sitting for prolonged for BM- struck her face- denies LOC. She reports extreme difficulty with ambulation, able to perform ADLs, however "it takes me a long time". Of note- she had similar back pain- however R sided sciatica Spring 2020- she is agreeable to PT referral.  CMP 01/2019  GFR 98 LFTS-WNL Patient Care Team    Relationship Specialty Notifications Start End  Esaw Grandchild, NP PCP - General Family Medicine  10/18/16     Patient Active Problem List   Diagnosis Date Noted  . Candidiasis, cutaneous 07/14/2018  . Flu-like symptoms 07/09/2018  . Pharyngitis 07/09/2018  . GAD (generalized anxiety disorder) 06/02/2018  . Dysuria 03/27/2018  . Family history of Parkinsonism 03/27/2018  . Dizziness 03/27/2018  . Bradycardia 03/23/2018  . HLD (hyperlipidemia) 03/23/2018  . Coronary atherosclerosis 03/23/2018  . Chest pain 03/22/2018  . Varicose vein of leg 12/24/2017  . Venous reflux 12/24/2017  . Leukocytosis 09/29/2017  . Nausea 09/29/2017  . Anxiety 09/29/2017  . Pancreatitis 09/28/2017  . Dehydration 03/12/2017  . 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 Sleep apnea 01/09/2017  . Healthcare maintenance 10/18/2016  . LOW BACK PAIN, ACUTE  11/20/2006  . POLYCYSTIC OVARY 07/04/2006  . OBESITY, NOS 07/04/2006  . TOBACCO USE, QUIT 07/04/2006     Past Medical History:  Diagnosis Date  . Abnormal uterine bleeding   . Amenorrhea   . Anemia   . Anxiety    panic attacks  . Bradycardia   . Diabetes mellitus without complication ([place])    type 2 ---5/26 no loner in pre diabetic range since bariatric sx 6 months ago  . Dysmenorrhea   . GERD (gastroesophageal reflux disease)    occasional - diet controlled and tums prn  . Hyperlipidemia    no meds -diet controlled  . Infertility, female   . Insulin resistance   . Migraine without aura   . Missed abortion    x 2 - both resolved without surgery  . Obesity   . PCOS (polycystic ovarian syndrome)   . PCOS (polycystic ovarian syndrome)   . PONV (postoperative nausea and vomiting)    pe rpatient ; scopalamine patch very helpful   . Sleep apnea   . SVD (spontaneous vaginal delivery)    x 1  . Urinary incontinence      Past Surgical History:  Procedure Laterality Date  . CHOLECYSTECTOMY    . COLONOSCOPY  2017   polyps  . CYSTOSCOPY N/A 10/29/2016   Procedure: CYSTOSCOPY;  Surgeon: Salvadore Dom, MD;  Location: [facility];  Service: Gynecology;  Laterality: N/A;  . DILATATION & CURRETTAGE/HYSTEROSCOPY WITH RESECTOCOPE N/A 10/17/2012   Procedure: DILATATION & CURETTAGE/HYSTEROSCOPY WITH RESECTOCOPE; Possible Polypectomy, Possible Resectoscopic Myomectomy.;  Surgeon: Floyce Stakes. Pamala Hurry, MD;  Location: [facility];  Service: Gynecology;  Laterality: N/A;  1 hr.  Marland Kitchen DILATION AND CURETTAGE OF UTERUS    . ENDOMETRIAL BIOPSY    . ENDOVENOUS ABLATION SAPHENOUS VEIN W/ LASER Right 05/08/2018   endovenous laser ablation right greater saphenous vein and stab phlebectomy > 20 incisions right leg by Deitra Mayo MD   . ESOPHAGOGASTRODUODENOSCOPY ENDOSCOPY  2017   Normal  . HYSTEROSCOPY WITH D & C N/A 12/01/2013   Procedure: IUD Removal;  Surgeon: Floyce Stakes. Pamala Hurry, MD;  Location: [facility];  Service: Gynecology;  Laterality: N/A;  90 min.  . INTRAUTERINE DEVICE (IUD) INSERTION N/A 10/17/2012   Procedure: INTRAUTERINE DEVICE (IUD) INSERTION; Mirena;  Surgeon: Floyce Stakes. Pamala Hurry, MD;  Location: [facility];  Service: Gynecology;  Laterality: N/A;  . LAPAROSCOPIC GASTRIC SLEEVE RESECTION N/A 02/19/2017   Procedure: LAPAROSCOPIC GASTRIC SLEEVE RESECTION, UPPER ENDOSCOPY;  Surgeon: Johnathan Hausen, MD;  Location: [facility];  Service: General;  Laterality: N/A;  . LAPAROSCOPIC SALPINGO OOPHERECTOMY Left 10/29/2016   Procedure: LAPAROSCOPIC SALPINGO OOPHORECTOMY With anterior uterine biopsy;  Surgeon: Salvadore Dom, MD;  Location: [facility];  Service: Gynecology;  Laterality: Left;  . LASIK  10/06/2018  . MOUTH SURGERY     wisdom teeth     Family History  Problem Relation Age of Onset  . Diabetes Father   . Thyroid disease Father   . Hypertension Father   . Parkinson's disease Father   . Dementia Father   . Diabetes Maternal Grandmother   . Heart disease Maternal Grandmother   . Diabetes Maternal Grandfather   . Heart disease Maternal Grandfather   . Stroke Maternal Grandfather   . Diabetes Paternal Grandmother   . Diabetes Paternal Grandfather   . Heart disease Paternal Grandfather   . Alcohol abuse Paternal Grandfather   . Hypertension Mother   . Diabetes Mother   . Colon cancer Maternal Uncle      Social History   Substance and Sexual Activity  Drug Use No     Social History   Substance and Sexual Activity  Alcohol Use Yes   Comment: 1 a month     Social History   Tobacco Use  Smoking Status Former Smoker  . Packs/day: 0.25  . Years: 19.00  . Pack years: 4.75  . Types: Cigarettes  . Quit date: 06/06/2016  . Years since quitting: 2.9  Smokeless Tobacco Never Used     Outpatient Encounter Medications as of 05/18/2019  Medication Sig  . aspirin EC 81 MG EC tablet Take 1 tablet (81 mg total) by mouth daily.  . Biotin w/ Vitamins C & E  (HAIR/SKIN/NAILS PO) Take by mouth.  . Calcium-Vitamin D-Vitamin K (CALCIUM SOFT CHEWS PO) Take by mouth daily.  . diazepam (VALIUM) 5 MG tablet Take 1 tablet (5 mg total) by mouth every 8 (eight) hours as needed for anxiety.  Marland Kitchen ibuprofen (ADVIL,MOTRIN) 200 MG tablet Take 200 mg by mouth every 6 (six) hours as needed for headache or moderate pain.  . IRON PO Take 45 mg by mouth every other day.  . levonorgestrel (MIRENA) 20 MCG/24HR IUD 1 each by Intrauterine route once.  . loratadine (CLARITIN) 10 MG tablet Take 10 mg by mouth daily as needed for allergies.  . Multiple Vitamins-Iron (MULTIVITAMINS WITH IRON) TABS tablet Take 1 tablet by mouth 2 (two) times daily. CHEWABLE  . ondansetron (ZOFRAN) 8 MG tablet Take 1 tablet (8 mg total) by mouth every 8 (eight) hours as needed for  nausea or vomiting.  Marland Kitchen OVER THE COUNTER MEDICATION Take 500 mg by mouth 3 (three) times daily. Bariatric calcium 500mg    . Probiotic Product (PROBIOTIC ADVANCED PO) Take by mouth.  . cyclobenzaprine (FLEXERIL) 10 MG tablet Take 1 tablet (10 mg total) by mouth 3 (three) times daily as needed for muscle spasms.  . predniSONE (DELTASONE) 20 MG tablet Take 3 pills a day for 2 days, 2 pills a day for 2 days, 1 pill a day for 2 days then one half pill a day for 2 days then off  . [DISCONTINUED] sulfamethoxazole-trimethoprim (BACTRIM DS) 800-160 MG tablet Take 1 tablet by mouth 2 (two) times daily.  . [EXPIRED] methylPREDNISolone acetate (DEPO-MEDROL) injection 80 mg    No facility-administered encounter medications on file as of 05/18/2019.    Allergies: Iohexol and Pyridium [phenazopyridine hcl]  Body mass index is 38.84 kg/m.  Blood pressure 120/75, pulse 66, temperature 98.1 F (36.7 C), temperature source Oral, height 5' 8.25" (1.734 m), weight 257 lb 4.8 oz (116.7 kg), SpO2 98 %.     Review of Systems  Constitutional: Positive for activity change and fatigue. Negative for appetite change, chills, diaphoresis,  fever and unexpected weight change.  Eyes: Negative for visual disturbance.  Respiratory: Negative for cough, chest tightness, shortness of breath, wheezing and stridor.   Cardiovascular: Negative for chest pain, palpitations and leg swelling.  Gastrointestinal: Negative for abdominal distention, abdominal pain, blood in stool, constipation, diarrhea, nausea and vomiting.  Endocrine: Negative for polydipsia, polyphagia and polyuria.  Genitourinary: Negative for difficulty urinating and flank pain.  Musculoskeletal: Positive for arthralgias, back pain, gait problem and myalgias.  Neurological: Negative for dizziness and headaches.  Hematological: Negative for adenopathy. Does not bruise/bleed easily.       Objective:   Physical Exam Vitals and nursing note reviewed.  Constitutional:      General: She is in acute distress.     Appearance: She is obese.  HENT:     Head: Normocephalic and atraumatic.  Eyes:     Extraocular Movements: Extraocular movements intact.     Conjunctiva/sclera: Conjunctivae normal.     Pupils: Pupils are equal, round, and reactive to light.     Comments: Ecchymosis noted beneath OS  Cardiovascular:     Rate and Rhythm: Normal rate and regular rhythm.     Pulses: Normal pulses.     Heart sounds: Normal heart sounds. No murmur. No friction rub. No gallop.   Pulmonary:     Effort: Pulmonary effort is normal. No respiratory distress.     Breath sounds: Normal breath sounds. No stridor. No wheezing, rhonchi or rales.  Chest:     Chest wall: No tenderness.  Musculoskeletal:        General: Tenderness present.     Cervical back: Normal.     Thoracic back: Normal.     Lumbar back: Spasms and tenderness present. Decreased range of motion.     Comments: Lumbar back-very limited flexion/extension. Unable to perform full ROM due to pt's discomfort level  Skin:    General: Skin is warm and dry.     Capillary Refill: Capillary refill takes less than 2 seconds.   Neurological:     Mental Status: She is alert and oriented to person, place, and time.     Coordination: Coordination normal.     Gait: Gait normal.  Psychiatric:        Mood and Affect: Mood normal.        Behavior:  Behavior normal.        Thought Content: Thought content normal.        Judgment: Judgment normal.       Assessment & Plan:   1. Acute right-sided back pain with sciatica   2. Acute left-sided low back pain with left-sided sciatica     LOW BACK PAIN, ACUTE Since you received steroid injection in clinic today- start oral prednisone TOMORROW 05/19/2019.  OTC Acetaminophen as needed. Cyclobenzaprine as needed for muscle spasm- do not drive or drink alcohol when taking. Referral to Physical Therapy placed. If pain is not improving in two weeks please follow-up for further evaluation, +/- imaging. Continue to social distance and wear a mask when in public.    FOLLOW-UP:  Return in about 2 weeks (around 06/01/2019) for Musculoskeletal Pain.

## 2019-05-22 ENCOUNTER — Encounter: Payer: Self-pay | Admitting: Adult Health

## 2019-05-22 IMAGING — CT CT ABD-PELV W/O CM
2 of 4 series · 16 of 46 positions shown, 18 images · non-contrast
Comparison: None.

CLINICAL DATA: Lower abdominal pain with nausea and vomiting, onset
at [DATE]. Recent ovarian surgery.

EXAM:
CT ABDOMEN AND PELVIS WITHOUT CONTRAST
TECHNIQUE: Multidetector CT imaging of the abdomen and pelvis was performed
following the standard protocol without IV contrast.

[Series 2: abd/pel w/o · axial · non-contrast · 0.98mm/px · z∈[+857,+1307]mm · 13 of 102 slices shown, 15 images]
[im 6/102  soft-tissue]
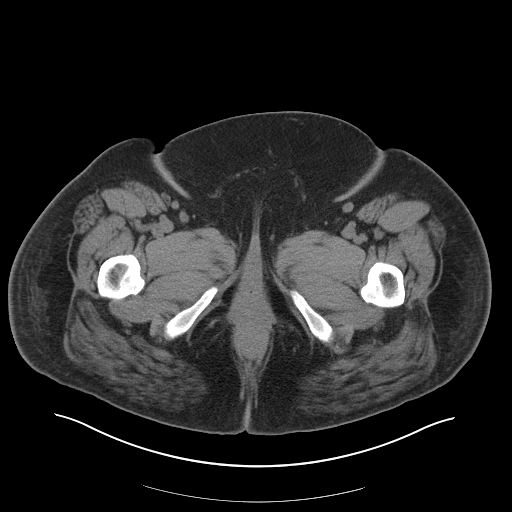
[im 6/102  bone]
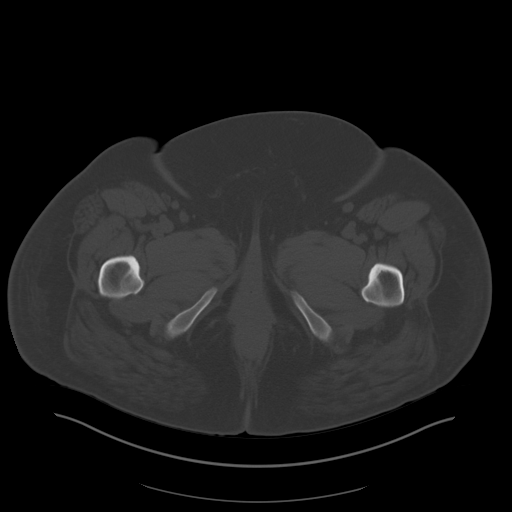
[im 12/102  soft-tissue]
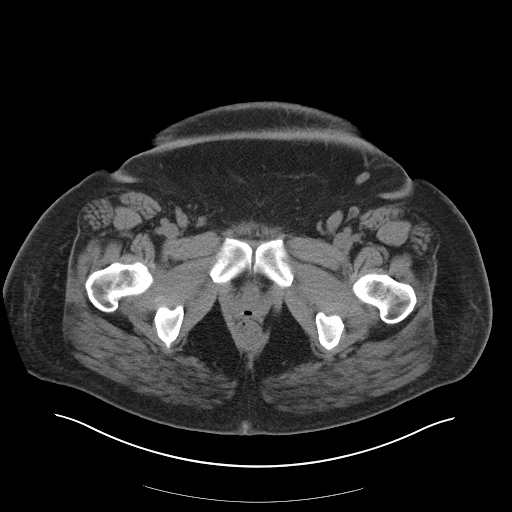
[im 24/102  soft-tissue]
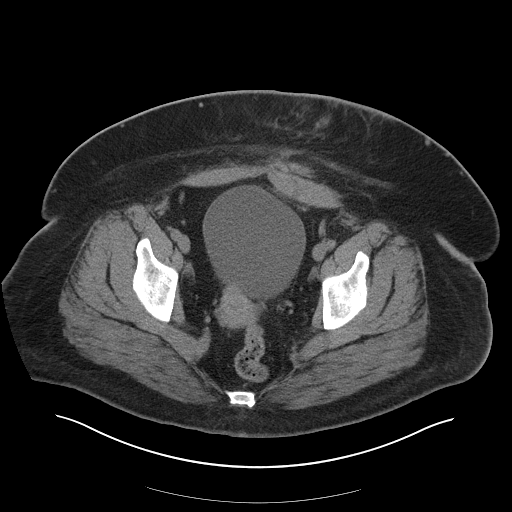
[im 30/102  soft-tissue]
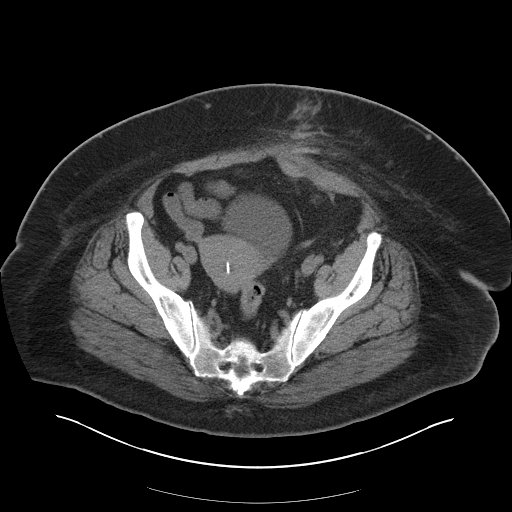
[im 36/102  soft-tissue]
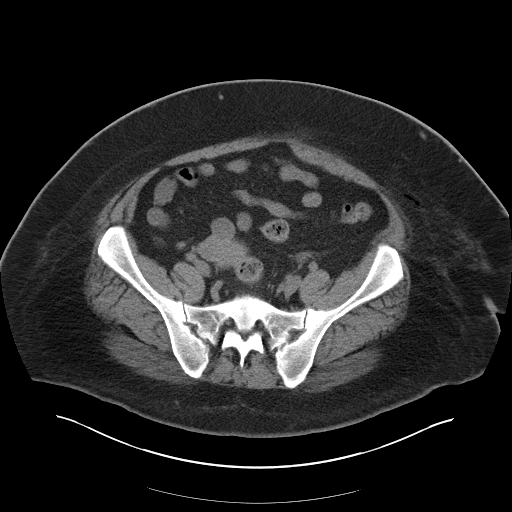
[im 42/102  soft-tissue]
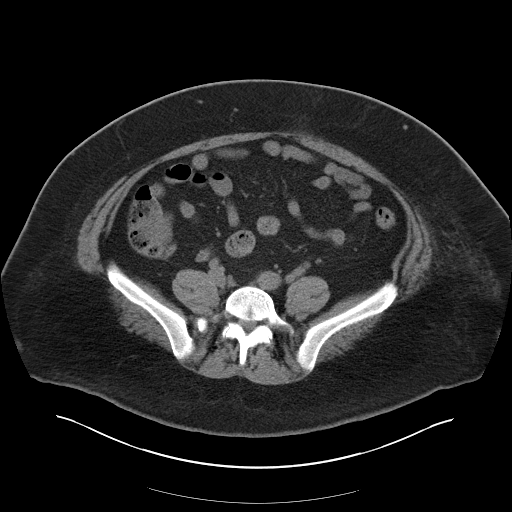
[im 54/102  soft-tissue]
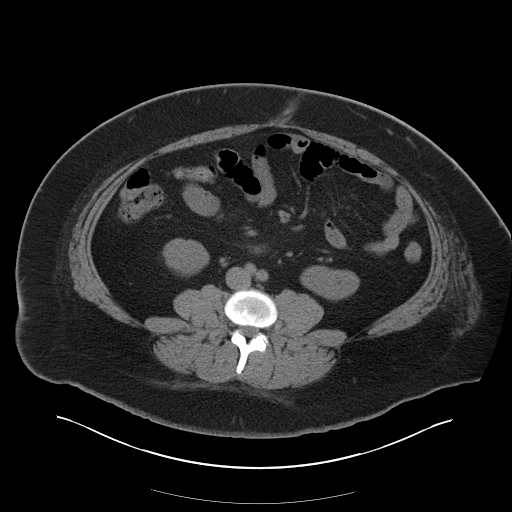
[im 60/102  soft-tissue]
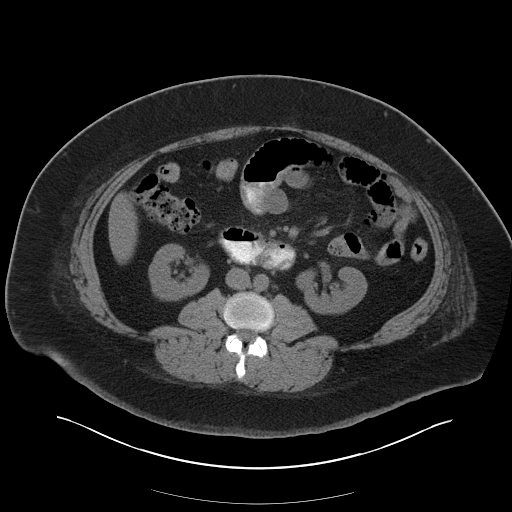
[im 66/102  soft-tissue]
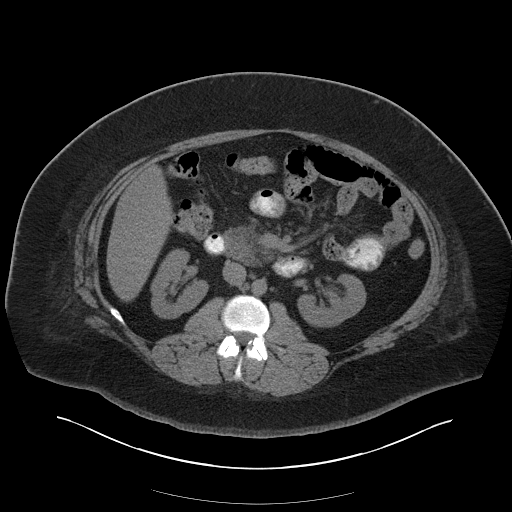
[im 66/102  bone]
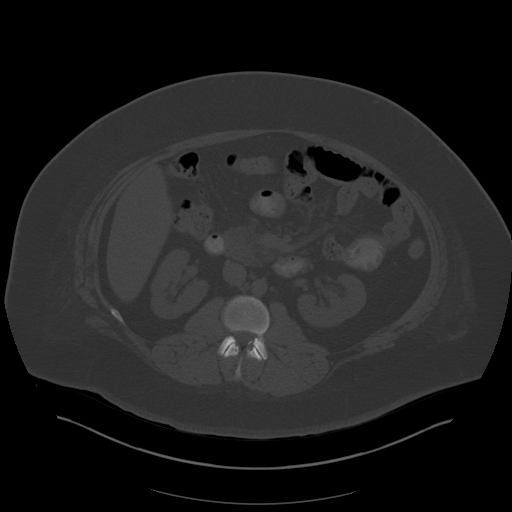
[im 72/102  soft-tissue]
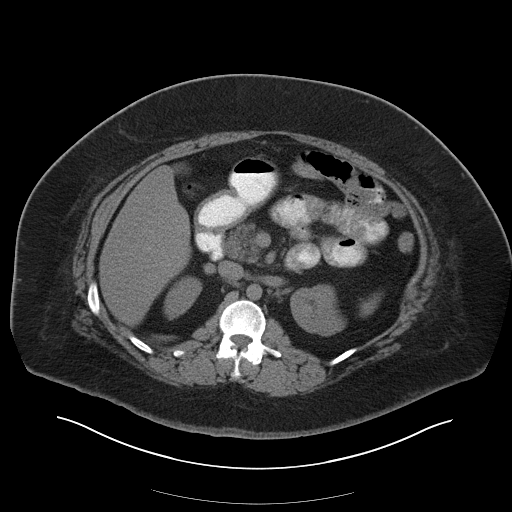
[im 78/102  soft-tissue]
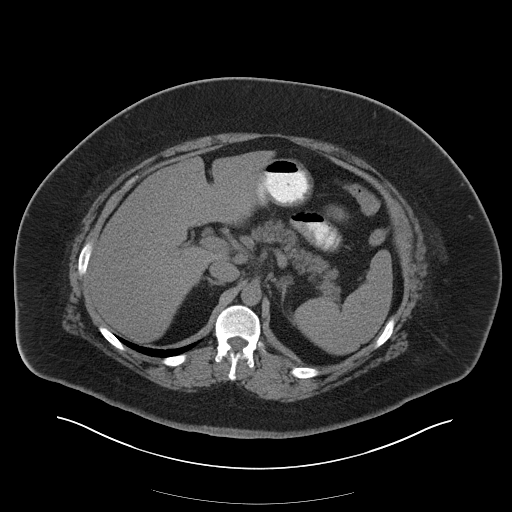
[im 90/102  soft-tissue]
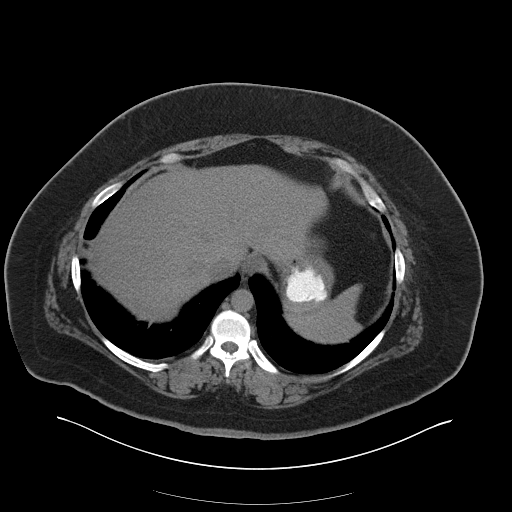
[im 96/102  soft-tissue]
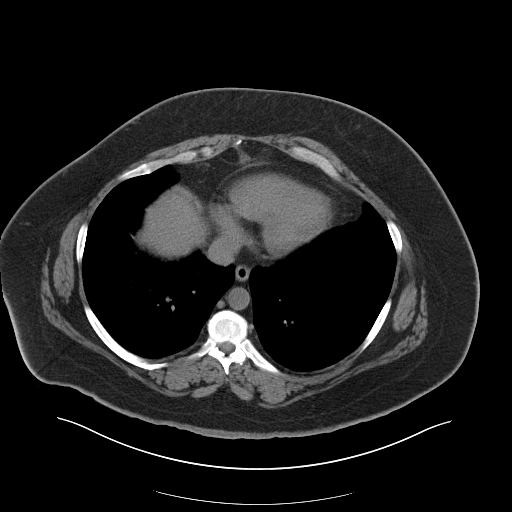

[Series 5: coronal · coronal · 0.95mm/px · 3 of 169 slices shown]
[im 57/169  soft-tissue]
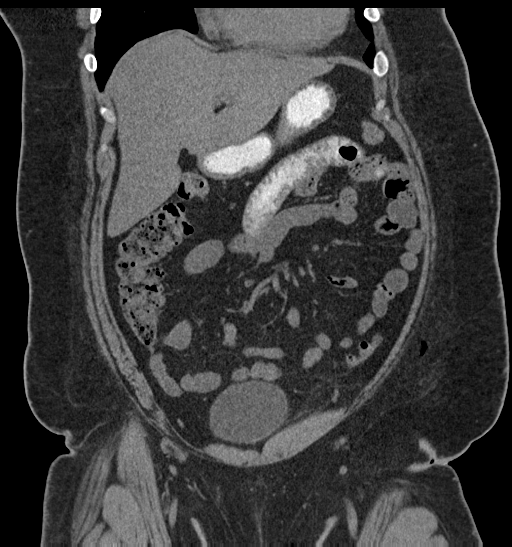
[im 75/169  soft-tissue]
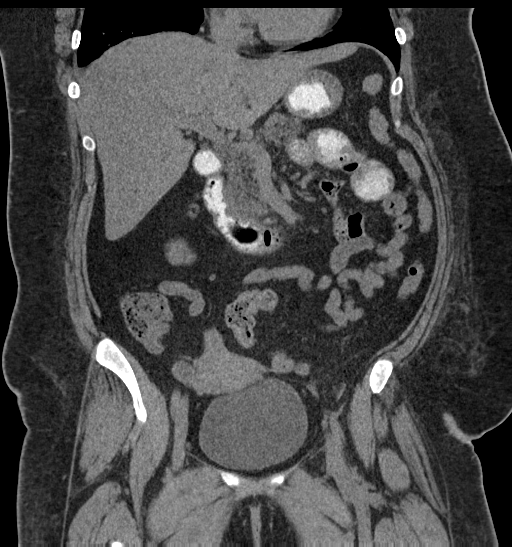
[im 94/169  soft-tissue]
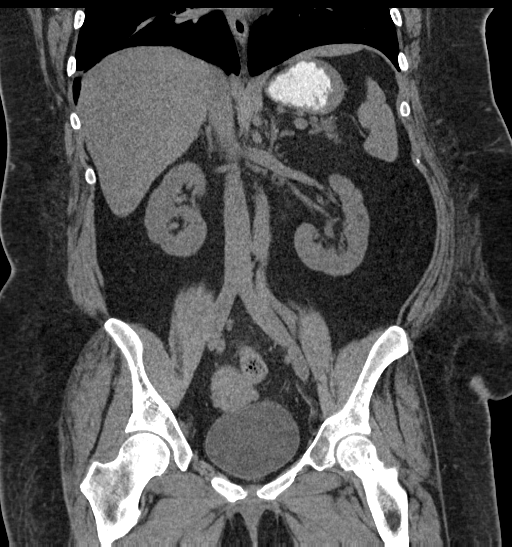

[16 of 46 positions shown; findings below may reference images not displayed]

FINDINGS: Lower chest: Minimal linear atelectatic changes in the right lung
base. No consolidation or effusion.

Hepatobiliary: No focal liver abnormality is seen. Status post
cholecystectomy. No biliary dilatation.

Pancreas: Unremarkable. No pancreatic ductal dilatation or
surrounding inflammatory changes.

Spleen: Normal in size without focal abnormality.

Adrenals/Urinary Tract: Adrenal glands are unremarkable. Kidneys are
normal, without renal calculi, focal lesion, or hydronephrosis.
Bladder is unremarkable.

Stomach/Bowel: Stomach is within normal limits. Appendix is normal.
No evidence of bowel wall thickening, distention, or inflammatory
changes.

Vascular/Lymphatic: No significant vascular findings are present. No
enlarged abdominal or pelvic lymph nodes.

Reproductive: Unremarkable uterus. IUD present. No adnexal mass or
abnormal fluid collection.

Other: Lower abdominal surgical/laparoscopic tract with mild
surrounding subcutaneous edema. Tiny bubble of intraperitoneal air
likely reflects the recent surgery. No focal inflammatory changes
within the abdomen or pelvis.

Musculoskeletal: No significant skeletal lesion.
IMPRESSION: 1. Tiny bubble of intraperitoneal air, probably due to the recent
surgery.
2. Surgical/laparoscopic track in the lower abdominal wall with mild
surrounding subcutaneous edema; this is the expected appearance
given the described procedure 4 days ago.
3. No inflammation or abnormal collections within the abdomen or
pelvis.

## 2019-06-01 NOTE — Progress Notes (Signed)
39 y.o. PO:[id number] Married White or Caucasian Not Hispanic or Latino female here for annual exam Patient states that she is having pelvic pain. She has had frequent yeast infections and is having urgency or urination. She was treated for a UTI last month. Urinary urgency restarted yesterday, some frequency, she is feeling some urinary pressure. No dysuria.  She is having yeast in her umbilicus, under her panus, groin. Has been coming off and on since her weight loss surgery. She has nystatin, needs more.  She has self treated for yeast in the last several months. Had yeast on affirm in 11/20.  She has a slight vaginal d/c right now, no itching, burning or irritation.  She is also having fatigue.    She has a mirena IUD, placed in 12/17. No cycles, occasional spotting. H/O PCOS.   She c/o intermittent abdominal pelvic pain since the end of November, more midline and toward the right, can be on the left. The pain is coming every 1-2.5 weeks, then it will come and go over several days. The pain is a sharp or aching/sweezing pain, can last an entire day. Up to a 5/10 in severity. No bowel or bladder c/o. The pain started initially after intercourse. No dyspareunia.   She is s/p laparoscopic LOA, LSO and chromopertubation in 6/18. Pathology with cystadenoma and endometriosis.  She had a sleeve gastrectomy in 10/18. She lost 110 lbs, has gained ~40 lbs back.     No LMP recorded. (Menstrual status: IUD).          Sexually active: Yes.    The current method of family planning is IUD.    Exercising: Yes.    Cardio and strength training  Smoker:  no  Health Maintenance: Pap:  03-14-16 WNL NEG HR HPV History of abnormal Pap:  Yes, 2004--repeat PAP normal TDaP:  11/18/13 Gardasil: No   reports that she quit smoking about 2 years ago. Her smoking use included cigarettes. She has a 4.75 pack-year smoking history. She has never used smokeless tobacco. She reports current alcohol use. She reports that she does  not use drugs. Social ETOH (rare). Working for [employer], loves it. Son is 45, senior in [employer]. Will go into Financial planner.  Separated from her husband. Sexually active with a new partner.   Past Medical History:  Diagnosis Date  . Abnormal uterine bleeding   . Amenorrhea   . Anemia   . Anxiety    panic attacks  . Bradycardia   . Diabetes mellitus without complication ([place])    type 2 ---5/26 no loner in pre diabetic range since bariatric sx 6 months ago  . Dysmenorrhea   . Endometriosis   . GERD (gastroesophageal reflux disease)    occasional - diet controlled and tums prn  . Hyperlipidemia    no meds -diet controlled  . Infertility, female   . Insulin resistance   . Migraine without aura   . Missed abortion    x 2 - both resolved without surgery  . Obesity   . PCOS (polycystic ovarian syndrome)   . PCOS (polycystic ovarian syndrome)   . PONV (postoperative nausea and vomiting)    pe rpatient ; scopalamine patch very helpful   . Sleep apnea   . SVD (spontaneous vaginal delivery)    x 1  . Urinary incontinence     Past Surgical History:  Procedure Laterality Date  . CHOLECYSTECTOMY    . COLONOSCOPY  2017   polyps  . CYSTOSCOPY  N/A 10/29/2016   Procedure: CYSTOSCOPY;  Surgeon: Salvadore Dom, MD;  Location: [facility];  Service: Gynecology;  Laterality: N/A;  . DILATATION & CURRETTAGE/HYSTEROSCOPY WITH RESECTOCOPE N/A 10/17/2012   Procedure: DILATATION & CURETTAGE/HYSTEROSCOPY WITH RESECTOCOPE; Possible Polypectomy, Possible Resectoscopic Myomectomy.;  Surgeon: Floyce Stakes. Pamala Hurry, MD;  Location: [facility];  Service: Gynecology;  Laterality: N/A;  1 hr.  Marland Kitchen DILATION AND CURETTAGE OF UTERUS    . ENDOMETRIAL BIOPSY    . ENDOVENOUS ABLATION SAPHENOUS VEIN W/ LASER Right 05/08/2018   endovenous laser ablation right greater saphenous vein and stab phlebectomy > 20 incisions right leg by Deitra Mayo MD   . ESOPHAGOGASTRODUODENOSCOPY ENDOSCOPY  2017   Normal  . HYSTEROSCOPY  WITH D & C N/A 12/01/2013   Procedure: IUD Removal;  Surgeon: Floyce Stakes. Pamala Hurry, MD;  Location: [facility];  Service: Gynecology;  Laterality: N/A;  90 min.  . INTRAUTERINE DEVICE (IUD) INSERTION N/A 10/17/2012   Procedure: INTRAUTERINE DEVICE (IUD) INSERTION; Mirena;  Surgeon: Floyce Stakes. Pamala Hurry, MD;  Location: [facility];  Service: Gynecology;  Laterality: N/A;  . LAPAROSCOPIC GASTRIC SLEEVE RESECTION N/A 02/19/2017   Procedure: LAPAROSCOPIC GASTRIC SLEEVE RESECTION, UPPER ENDOSCOPY;  Surgeon: Johnathan Hausen, MD;  Location: [facility];  Service: General;  Laterality: N/A;  . LAPAROSCOPIC SALPINGO OOPHERECTOMY Left 10/29/2016   Procedure: LAPAROSCOPIC SALPINGO OOPHORECTOMY With anterior uterine biopsy;  Surgeon: Salvadore Dom, MD;  Location: [facility];  Service: Gynecology;  Laterality: Left;  . LASIK  10/06/2018  . MOUTH SURGERY     wisdom teeth    Current Outpatient Medications  Medication Sig Dispense Refill  . aspirin EC 81 MG EC tablet Take 1 tablet (81 mg total) by mouth daily.    . Biotin w/ Vitamins C & E (HAIR/SKIN/NAILS PO) Take by mouth.    . Calcium-Vitamin D-Vitamin K (CALCIUM SOFT CHEWS PO) Take by mouth daily.    . cyclobenzaprine (FLEXERIL) 10 MG tablet Take 1 tablet (10 mg total) by mouth 3 (three) times daily as needed for muscle spasms. 30 tablet 0  . diazepam (VALIUM) 5 MG tablet Take 1 tablet (5 mg total) by mouth every 8 (eight) hours as needed for anxiety. 60 tablet 0  . ibuprofen (ADVIL,MOTRIN) 200 MG tablet Take 200 mg by mouth every 6 (six) hours as needed for headache or moderate pain.    . IRON PO Take 45 mg by mouth every other day.    . levonorgestrel (MIRENA) 20 MCG/24HR IUD 1 each by Intrauterine route once.    . loratadine (CLARITIN) 10 MG tablet Take 10 mg by mouth daily as needed for allergies.    . Multiple Vitamins-Iron (MULTIVITAMINS WITH IRON) TABS tablet Take 1 tablet by mouth 2 (two) times daily. CHEWABLE    . ondansetron (ZOFRAN) 8 MG tablet Take 1 tablet (8  mg total) by mouth every 8 (eight) hours as needed for nausea or vomiting. 20 tablet 0  . OVER THE COUNTER MEDICATION Take 500 mg by mouth 3 (three) times daily. Bariatric calcium 500mg      . Probiotic Product (PROBIOTIC ADVANCED PO) Take by mouth.    . nystatin cream (MYCOSTATIN) Apply 1 application topically 2 (two) times daily. Apply to affected area BID for up to 7 days. 30 g 1   No current facility-administered medications for this visit.    Family History  Problem Relation Age of Onset  . Diabetes Father   . Thyroid disease Father   . Hypertension Father   . Parkinson's disease  Father   . Dementia Father   . Diabetes Maternal Grandmother   . Heart disease Maternal Grandmother   . Diabetes Maternal Grandfather   . Heart disease Maternal Grandfather   . Stroke Maternal Grandfather   . Diabetes Paternal Grandmother   . Diabetes Paternal Grandfather   . Heart disease Paternal Grandfather   . Alcohol abuse Paternal Grandfather   . Hypertension Mother   . Diabetes Mother   . Colon cancer Maternal Uncle     Review of Systems  Constitutional: Positive for fatigue.  Genitourinary: Positive for frequency, pelvic pain and urgency.  Fatigue has been bad in the last month.   Exam:   BP 110/68   Pulse (!) 58   Temp 98.3 F (36.8 C)   Ht 5\' 8"  (1.727 m)   Wt 257 lb (116.6 kg)   SpO2 99%   BMI 39.08 kg/m   Weight change: @WEIGHTCHANGE @ Height:   Height: 5\' 8"  (172.7 cm)  Ht Readings from Last 3 Encounters:  06/04/19 5\' 8"  (1.727 m)  05/18/19 5' 8.25" (1.734 m)  05/07/19 5' 8.25" (1.734 m)    General appearance: alert, cooperative and appears stated age Head: Normocephalic, without obvious abnormality, atraumatic Neck: no adenopathy, supple, symmetrical, trachea midline and thyroid normal to inspection and palpation Lungs: clear to auscultation bilaterally Cardiovascular: regular rate and rhythm Breasts: normal appearance, no masses or tenderness Abdomen: soft,  non-tender; non distended,  no masses,  no organomegaly Extremities: extremities normal, atraumatic, no cyanosis or edema Skin: Skin color, texture, turgor normal. Slight erythematous rash under her panus c/w candida intertrigo. Lymph nodes: Cervical, supraclavicular, and axillary nodes normal. No abnormal inguinal nodes palpated Neurologic: Grossly normal   Pelvic: External genitalia:  no lesions              Urethra:  normal appearing urethra with no masses, tenderness or lesions              Bartholins and Skenes: normal                 Vagina: normal appearing vagina with normal color and discharge, no lesions              Cervix: no lesions and IUD strings 3-4 cm               Bimanual Exam:  Uterus:  anteverted, mobile, not tender, not appreciably enlarged.              Adnexa: no masses, tender in the right adnexa               Rectovaginal: Confirms               Anus:  normal sphincter tone, no lesions  Evern Core chaperoned for the exam.  A:  Well Woman with normal exam  Fatigue  Pelvic pain  Urinary frequency/urgency  Candida intertrigo  C/o recurrent yeast vaginitis  IUD check up, doing well  P:   No pap this year  Return for pelvic ultrasound  Screening STD, vaginitis  Urine for ua, c&s, will call if her symptoms worsen  CBC, TSH, vit B12, folate, vit D  Using condoms

## 2019-06-04 ENCOUNTER — Encounter: Payer: Self-pay | Admitting: Obstetrics and Gynecology

## 2019-06-04 ENCOUNTER — Other Ambulatory Visit: Payer: Self-pay

## 2019-06-04 ENCOUNTER — Ambulatory Visit (INDEPENDENT_AMBULATORY_CARE_PROVIDER_SITE_OTHER): Payer: No Typology Code available for payment source | Admitting: Obstetrics and Gynecology

## 2019-06-04 VITALS — BP 110/68 | HR 58 | Temp 98.3°F | Ht 68.0 in | Wt 257.0 lb

## 2019-06-04 DIAGNOSIS — Z8742 Personal history of other diseases of the female genital tract: Secondary | ICD-10-CM

## 2019-06-04 DIAGNOSIS — Z9889 Other specified postprocedural states: Secondary | ICD-10-CM

## 2019-06-04 DIAGNOSIS — Z01419 Encounter for gynecological examination (general) (routine) without abnormal findings: Secondary | ICD-10-CM

## 2019-06-04 DIAGNOSIS — B372 Candidiasis of skin and nail: Secondary | ICD-10-CM

## 2019-06-04 DIAGNOSIS — N898 Other specified noninflammatory disorders of vagina: Secondary | ICD-10-CM

## 2019-06-04 DIAGNOSIS — N809 Endometriosis, unspecified: Secondary | ICD-10-CM | POA: Insufficient documentation

## 2019-06-04 DIAGNOSIS — R3915 Urgency of urination: Secondary | ICD-10-CM | POA: Diagnosis not present

## 2019-06-04 DIAGNOSIS — R5383 Other fatigue: Secondary | ICD-10-CM

## 2019-06-04 DIAGNOSIS — R102 Pelvic and perineal pain: Secondary | ICD-10-CM

## 2019-06-04 DIAGNOSIS — R109 Unspecified abdominal pain: Secondary | ICD-10-CM | POA: Diagnosis not present

## 2019-06-04 DIAGNOSIS — Z30431 Encounter for routine checking of intrauterine contraceptive device: Secondary | ICD-10-CM

## 2019-06-04 DIAGNOSIS — Z113 Encounter for screening for infections with a predominantly sexual mode of transmission: Secondary | ICD-10-CM

## 2019-06-04 LAB — POCT URINALYSIS DIPSTICK
Bilirubin, UA: NEGATIVE
Blood, UA: NEGATIVE
Glucose, UA: NEGATIVE
Ketones, UA: NEGATIVE
Leukocytes, UA: NEGATIVE
Nitrite, UA: NEGATIVE
Protein, UA: NEGATIVE
Urobilinogen, UA: 0.2 E.U./dL
pH, UA: 5 (ref 5.0–8.0)

## 2019-06-04 MED ORDER — NYSTATIN 100000 UNIT/GM EX CREA: 1.0000 "application " | TOPICAL_CREAM | Freq: Two times a day (BID) | CUTANEOUS | 1 refills | Status: DC

## 2019-06-04 MED FILL — NYSTATIN 100,000 UNIT/GM CR: 100000 | 7 days supply | Qty: 30 | Fill #0

## 2019-06-04 NOTE — Patient Instructions (Signed)
EXERCISE AND DIET:  We recommended that you start or continue a regular exercise program for good health. Regular exercise means any activity that makes your heart beat faster and makes you sweat.  We recommend exercising at least 30 minutes per day at least 3 days a week, preferably 4 or 5.  We also recommend a diet low in fat and sugar.  Inactivity, poor dietary choices and obesity can cause diabetes, heart attack, stroke, and kidney damage, among others.    ALCOHOL AND SMOKING:  Women should limit their alcohol intake to no more than 7 drinks/beers/glasses of wine (combined, not each!) per week. Moderation of alcohol intake to this level decreases your risk of breast cancer and liver damage. And of course, no recreational drugs are part of a healthy lifestyle.  And absolutely no smoking or even second hand smoke. Most people know smoking can cause heart and lung diseases, but did you know it also contributes to weakening of your bones? Aging of your skin?  Yellowing of your teeth and nails?  CALCIUM AND VITAMIN D:  Adequate intake of calcium and Vitamin D are recommended.  The recommendations for exact amounts of these supplements seem to change often, but generally speaking 1,000 mg of calcium (between diet and supplement) and 800 units of Vitamin D per day seems prudent. Certain women may benefit from higher intake of Vitamin D.  If you are among these women, your doctor will have told you during your visit.    PAP SMEARS:  Pap smears, to check for cervical cancer or precancers,  have traditionally been done yearly, although recent scientific advances have shown that most women can have pap smears less often.  However, every woman still should have a physical exam from her gynecologist every year. It will include a breast check, inspection of the vulva and vagina to check for abnormal growths or skin changes, a visual exam of the cervix, and then an exam to evaluate the size and shape of the uterus and  ovaries.  And after 40 years of age, a rectal exam is indicated to check for rectal cancers. We will also provide age appropriate advice regarding health maintenance, like when you should have certain vaccines, screening for sexually transmitted diseases, bone density testing, colonoscopy, mammograms, etc.   MAMMOGRAMS:  All women over 40 years old should have a yearly mammogram. Many facilities now offer a "3D" mammogram, which may cost around $50 extra out of pocket. If possible,  we recommend you accept the option to have the 3D mammogram performed.  It both reduces the number of women who will be called back for extra views which then turn out to be normal, and it is better than the routine mammogram at detecting truly abnormal areas.    COLON CANCER SCREENING: Now recommend starting at age 45. At this time colonoscopy is not covered for routine screening until 50. There are take home tests that can be done between 45-49.   COLONOSCOPY:  Colonoscopy to screen for colon cancer is recommended for all women at age 50.  We know, you hate the idea of the prep.  We agree, BUT, having colon cancer and not knowing it is worse!!  Colon cancer so often starts as a polyp that can be seen and removed at colonscopy, which can quite literally save your life!  And if your first colonoscopy is normal and you have no family history of colon cancer, most women don't have to have it again for   10 years.  Once every ten years, you can do something that may end up saving your life, right?  We will be happy to help you get it scheduled when you are ready.  Be sure to check your insurance coverage so you understand how much it will cost.  It may be covered as a preventative service at no cost, but you should check your particular policy.      Breast Self-Awareness Breast self-awareness means being familiar with how your breasts look and feel. It involves checking your breasts regularly and reporting any changes to your  health care provider. Practicing breast self-awareness is important. A change in your breasts can be a sign of a serious medical problem. Being familiar with how your breasts look and feel allows you to find any problems early, when treatment is more likely to be successful. All women should practice breast self-awareness, including women who have had breast implants. How to do a breast self-exam One way to learn what is normal for your breasts and whether your breasts are changing is to do a breast self-exam. To do a breast self-exam: Look for Changes  1. Remove all the clothing above your waist. 2. Stand in front of a mirror in a room with good lighting. 3. Put your hands on your hips. 4. Push your hands firmly downward. 5. Compare your breasts in the mirror. Look for differences between them (asymmetry), such as: ? Differences in shape. ? Differences in size. ? Puckers, dips, and bumps in one breast and not the other. 6. Look at each breast for changes in your skin, such as: ? Redness. ? Scaly areas. 7. Look for changes in your nipples, such as: ? Discharge. ? Bleeding. ? Dimpling. ? Redness. ? A change in position. Feel for Changes Carefully feel your breasts for lumps and changes. It is best to do this while lying on your back on the floor and again while sitting or standing in the shower or tub with soapy water on your skin. Feel each breast in the following way:  Place the arm on the side of the breast you are examining above your head.  Feel your breast with the other hand.  Start in the nipple area and make  inch (2 cm) overlapping circles to feel your breast. Use the pads of your three middle fingers to do this. Apply light pressure, then medium pressure, then firm pressure. The light pressure will allow you to feel the tissue closest to the skin. The medium pressure will allow you to feel the tissue that is a little deeper. The firm pressure will allow you to feel the tissue  close to the ribs.  Continue the overlapping circles, moving downward over the breast until you feel your ribs below your breast.  Move one finger-width toward the center of the body. Continue to use the  inch (2 cm) overlapping circles to feel your breast as you move slowly up toward your collarbone.  Continue the up and down exam using all three pressures until you reach your armpit.  Write Down What You Find  Write down what is normal for each breast and any changes that you find. Keep a written record with breast changes or normal findings for each breast. By writing this information down, you do not need to depend only on memory for size, tenderness, or location. Write down where you are in your menstrual cycle, if you are still menstruating. If you are having trouble noticing differences   in your breasts, do not get discouraged. With time you will become more familiar with the variations in your breasts and more comfortable with the exam. How often should I examine my breasts? Examine your breasts every month. If you are breastfeeding, the best time to examine your breasts is after a feeding or after using a breast pump. If you menstruate, the best time to examine your breasts is 5-7 days after your period is over. During your period, your breasts are lumpier, and it may be more difficult to notice changes. When should I see my health care provider? See your health care provider if you notice:  A change in shape or size of your breasts or nipples.  A change in the skin of your breast or nipples, such as a reddened or scaly area.  Unusual discharge from your nipples.  A lump or thick area that was not there before.  Pain in your breasts.  Anything that concerns you.  

## 2019-06-04 NOTE — Addendum Note (Signed)
Addended by: Georgia Lopes on: 06/04/2019 04:37 PM   Modules accepted: Orders

## 2019-06-05 LAB — RPR: RPR Ser Ql: NONREACTIVE

## 2019-06-05 LAB — CBC
Hematocrit: 41.8 % (ref 34.0–46.6)
Hemoglobin: 14.7 g/dL (ref 11.1–15.9)
MCH: 30.3 pg (ref 26.6–33.0)
MCHC: 35.2 g/dL (ref 31.5–35.7)
MCV: 86 fL (ref 79–97)
Platelets: 284 10*3/uL (ref 150–450)
RBC: 4.85 x10E6/uL (ref 3.77–5.28)
RDW: 12.2 % (ref 11.7–15.4)
WBC: 10.3 10*3/uL (ref 3.4–10.8)

## 2019-06-05 LAB — URINALYSIS, MICROSCOPIC ONLY
Casts: NONE SEEN /lpf
Epithelial Cells (non renal): >10 /hpf — AB (ref 0–10)

## 2019-06-05 LAB — B12 AND FOLATE PANEL
Folate: >20 ng/mL (ref >3.0)
Vitamin B-12: 495 pg/mL (ref 232–1245)

## 2019-06-05 LAB — TSH: TSH: 1.3 u[IU]/mL (ref 0.450–4.500)

## 2019-06-05 LAB — HIV ANTIBODY (ROUTINE TESTING W REFLEX): HIV Screen 4th Generation wRfx: NONREACTIVE

## 2019-06-05 LAB — URINE CULTURE

## 2019-06-05 LAB — VITAMIN D 25 HYDROXY (VIT D DEFICIENCY, FRACTURES): Vit D, 25-Hydroxy: 31.6 ng/mL (ref 30.0–100.0)

## 2019-06-11 LAB — NUSWAB VAGINITIS PLUS (VG+)
Candida albicans, NAA: POSITIVE — AB
Candida glabrata, NAA: NEGATIVE
Chlamydia trachomatis, NAA: NEGATIVE
Neisseria gonorrhoeae, NAA: NEGATIVE
Trich vag by NAA: NEGATIVE

## 2019-06-12 ENCOUNTER — Telehealth: Payer: Self-pay | Admitting: Obstetrics and Gynecology

## 2019-06-12 ENCOUNTER — Encounter: Payer: Self-pay | Admitting: Obstetrics and Gynecology

## 2019-06-12 ENCOUNTER — Other Ambulatory Visit: Payer: Self-pay | Admitting: Obstetrics and Gynecology

## 2019-06-12 MED ORDER — BETAMETHASONE VALERATE 0.1 % EX OINT: 1.0000 "application " | TOPICAL_OINTMENT | Freq: Two times a day (BID) | CUTANEOUS | 0 refills | Status: DC

## 2019-06-12 MED ORDER — FLUCONAZOLE 150 MG PO TABS: 150.0000 mg | ORAL_TABLET | Freq: Once | ORAL | 0 refills | Status: AC

## 2019-06-12 MED FILL — BETAMETHASONE VALER 0.1% OI: 0.1 | 15 days supply | Qty: 15 | Fill #0

## 2019-06-12 MED FILL — FLUCONAZOLE 150 MG TABS: 150 | 2 days supply | Qty: 2 | Fill #0

## 2019-06-12 NOTE — Telephone Encounter (Signed)
Non-Urgent Medical Question Received: Today Message Contents  [name] [name], [name] [name] sent to [facility]  Phone Number: [phone]  I have another yeast infection.  Symptoms include itching and white discharge that started yesterday.  At my appointment we discussed diflucan 1 a week for up to 6 months. Is there any way we can go ahead and start this? This is the 5th or 6th yeast infection since Thanksgiving.

## 2019-06-12 NOTE — Telephone Encounter (Signed)
I've spoken to the patient and sent in treatment. Her nuswab just returned + for yeast. Encounter closed.

## 2019-06-12 NOTE — Telephone Encounter (Signed)
Routing to Dr. Jertson to review and advise.  

## 2019-06-18 ENCOUNTER — Other Ambulatory Visit: Payer: No Typology Code available for payment source | Admitting: Obstetrics and Gynecology

## 2019-06-18 ENCOUNTER — Other Ambulatory Visit: Payer: Self-pay

## 2019-06-18 ENCOUNTER — Other Ambulatory Visit: Payer: No Typology Code available for payment source

## 2019-06-18 ENCOUNTER — Ambulatory Visit (INDEPENDENT_AMBULATORY_CARE_PROVIDER_SITE_OTHER): Payer: No Typology Code available for payment source

## 2019-06-18 ENCOUNTER — Ambulatory Visit (INDEPENDENT_AMBULATORY_CARE_PROVIDER_SITE_OTHER): Payer: No Typology Code available for payment source | Admitting: Family Medicine

## 2019-06-18 ENCOUNTER — Encounter (INDEPENDENT_AMBULATORY_CARE_PROVIDER_SITE_OTHER): Payer: Self-pay | Admitting: Family Medicine

## 2019-06-18 ENCOUNTER — Ambulatory Visit: Payer: No Typology Code available for payment source | Admitting: Obstetrics and Gynecology

## 2019-06-18 ENCOUNTER — Encounter: Payer: Self-pay | Admitting: Obstetrics and Gynecology

## 2019-06-18 VITALS — BP 110/62 | HR 78 | Temp 98.4°F | Ht 68.0 in | Wt 245.6 lb

## 2019-06-18 VITALS — BP 109/72 | HR 59 | Temp 97.8°F | Ht 68.0 in | Wt 243.0 lb

## 2019-06-18 DIAGNOSIS — R102 Pelvic and perineal pain: Secondary | ICD-10-CM

## 2019-06-18 DIAGNOSIS — Z6837 Body mass index (BMI) 37.0-37.9, adult: Secondary | ICD-10-CM

## 2019-06-18 DIAGNOSIS — R0602 Shortness of breath: Secondary | ICD-10-CM

## 2019-06-18 DIAGNOSIS — N83299 Other ovarian cyst, unspecified side: Secondary | ICD-10-CM

## 2019-06-18 DIAGNOSIS — Z8742 Personal history of other diseases of the female genital tract: Secondary | ICD-10-CM | POA: Diagnosis not present

## 2019-06-18 DIAGNOSIS — R109 Unspecified abdominal pain: Secondary | ICD-10-CM

## 2019-06-18 DIAGNOSIS — R5383 Other fatigue: Secondary | ICD-10-CM | POA: Diagnosis not present

## 2019-06-18 DIAGNOSIS — Z1331 Encounter for screening for depression: Secondary | ICD-10-CM

## 2019-06-18 DIAGNOSIS — Z9189 Other specified personal risk factors, not elsewhere classified: Secondary | ICD-10-CM | POA: Diagnosis not present

## 2019-06-18 DIAGNOSIS — Z0289 Encounter for other administrative examinations: Secondary | ICD-10-CM

## 2019-06-18 DIAGNOSIS — E559 Vitamin D deficiency, unspecified: Secondary | ICD-10-CM

## 2019-06-18 DIAGNOSIS — E119 Type 2 diabetes mellitus without complications: Secondary | ICD-10-CM

## 2019-06-18 NOTE — Progress Notes (Signed)
GYNECOLOGY  VISIT   HPI: 39 y.o.   Married White or Caucasian Not Hispanic or Latino  female   [phone] with No LMP recorded. (Menstrual status: IUD).   here for   Pelvic Ultrasound for c/o intermittent abdominal pelvic pain since 11/20.   GYNECOLOGIC HISTORY: No LMP recorded. (Menstrual status: IUD). Contraception:IUD Menopausal hormone therapy: none         OB History    Gravida  3   Para  1   Term  1   Preterm      AB  2   Living  1     SAB  2   TAB      Ectopic      Multiple      Live Births  1              Patient Active Problem List   Diagnosis Date Noted  . Endometriosis   . Candidiasis, cutaneous 07/14/2018  . Flu-like symptoms 07/09/2018  . Pharyngitis 07/09/2018  . GAD (generalized anxiety disorder) 06/02/2018  . Dysuria 03/27/2018  . Family history of Parkinsonism 03/27/2018  . Dizziness 03/27/2018  . Bradycardia 03/23/2018  . HLD (hyperlipidemia) 03/23/2018  . Coronary atherosclerosis 03/23/2018  . Chest pain 03/22/2018  . Varicose vein of leg 12/24/2017  . Venous reflux 12/24/2017  . Leukocytosis 09/29/2017  . Nausea 09/29/2017  . Anxiety 09/29/2017  . Pancreatitis 09/28/2017  . Dehydration 03/12/2017  . 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 Sleep apnea 01/09/2017  . Healthcare maintenance 10/18/2016  . LOW BACK PAIN, ACUTE 11/20/2006  . POLYCYSTIC OVARY 07/04/2006  . OBESITY, NOS 07/04/2006  . TOBACCO USE, QUIT 07/04/2006    Past Medical History:  Diagnosis Date  . Abnormal uterine bleeding   . Amenorrhea   . Anemia   . Anxiety    panic attacks  . Back pain   . Bradycardia   . Chest pain   . Constipation   . Diabetes ([place])   . Dysmenorrhea   . Endometriosis   . Gallbladder problem   . GERD (gastroesophageal reflux disease)    occasional - diet controlled and tums prn  . HLD (hyperlipidemia)   . HTN (hypertension)   . IBS (irritable bowel syndrome)   . Infertility, female   . Insulin  resistance   . Migraine without aura   . Missed abortion    x 2 - both resolved without surgery  . Obesity   . PCOS (polycystic ovarian syndrome)   . PCOS (polycystic ovarian syndrome)   . PONV (postoperative nausea and vomiting)    pe rpatient ; scopalamine patch very helpful   . Prediabetes   . Sleep apnea   . SVD (spontaneous vaginal delivery)    x 1  . Urinary incontinence   . Vitamin D deficiency     Past Surgical History:  Procedure Laterality Date  . CHOLECYSTECTOMY    . COLONOSCOPY  2017   polyps  . CYSTOSCOPY N/A 10/29/2016   Procedure: CYSTOSCOPY;  Surgeon: Salvadore Dom, MD;  Location: [facility];  Service: Gynecology;  Laterality: N/A;  . DILATATION & CURRETTAGE/HYSTEROSCOPY WITH RESECTOCOPE N/A 10/17/2012   Procedure: DILATATION & CURETTAGE/HYSTEROSCOPY WITH RESECTOCOPE; Possible Polypectomy, Possible Resectoscopic Myomectomy.;  Surgeon: Floyce Stakes. Pamala Hurry, MD;  Location: [facility];  Service: Gynecology;  Laterality: N/A;  1 hr.  Marland Kitchen DILATION AND CURETTAGE OF UTERUS    . ENDOMETRIAL BIOPSY    . ENDOVENOUS ABLATION SAPHENOUS VEIN W/ LASER Right  05/08/2018   endovenous laser ablation right greater saphenous vein and stab phlebectomy > 20 incisions right leg by Deitra Mayo MD   . ESOPHAGOGASTRODUODENOSCOPY ENDOSCOPY  2017   Normal  . HYSTEROSCOPY WITH D & C N/A 12/01/2013   Procedure: IUD Removal;  Surgeon: Floyce Stakes. Pamala Hurry, MD;  Location: [facility];  Service: Gynecology;  Laterality: N/A;  90 min.  . INTRAUTERINE DEVICE (IUD) INSERTION N/A 10/17/2012   Procedure: INTRAUTERINE DEVICE (IUD) INSERTION; Mirena;  Surgeon: Floyce Stakes. Pamala Hurry, MD;  Location: [facility];  Service: Gynecology;  Laterality: N/A;  . LAPAROSCOPIC GASTRIC SLEEVE RESECTION N/A 02/19/2017   Procedure: LAPAROSCOPIC GASTRIC SLEEVE RESECTION, UPPER ENDOSCOPY;  Surgeon: Johnathan Hausen, MD;  Location: [facility];  Service: General;  Laterality: N/A;  . LAPAROSCOPIC SALPINGO OOPHERECTOMY Left 10/29/2016    Procedure: LAPAROSCOPIC SALPINGO OOPHORECTOMY With anterior uterine biopsy;  Surgeon: Salvadore Dom, MD;  Location: [facility];  Service: Gynecology;  Laterality: Left;  . LASIK  10/06/2018  . MOUTH SURGERY     wisdom teeth    Current Outpatient Medications  Medication Sig Dispense Refill  . ascorbic acid (VITAMIN C) 500 MG tablet Take 500 mg by mouth daily.    . Biotin w/ Vitamins C & E (HAIR/SKIN/NAILS PO) Take by mouth.    . Chromium Picolinate 200 MCG CAPS Take 1 capsule by mouth daily.    . Coenzyme Q10 (COQ10) 100 MG CAPS Take 1 capsule by mouth daily.    . cyclobenzaprine (FLEXERIL) 10 MG tablet Take 1 tablet (10 mg total) by mouth 3 (three) times daily as needed for muscle spasms. 30 tablet 0  . diazepam (VALIUM) 5 MG tablet Take 1 tablet (5 mg total) by mouth every 8 (eight) hours as needed for anxiety. 60 tablet 0  . Docosahexaenoic Acid (DHA COMPLETE PO) Take 1 tablet by mouth daily.    . IRON PO Take 45 mg by mouth every other day.    . levonorgestrel (MIRENA) 20 MCG/24HR IUD 1 each by Intrauterine route once.    . loratadine (CLARITIN) 10 MG tablet Take 10 mg by mouth daily as needed for allergies.    . Multiple Vitamins-Iron (MULTIVITAMINS WITH IRON) TABS tablet Take 1 tablet by mouth 2 (two) times daily. CHEWABLE    . nystatin cream (MYCOSTATIN) Apply 1 application topically 2 (two) times daily. Apply to affected area BID for up to 7 days. 30 g 1  . ondansetron (ZOFRAN) 8 MG tablet Take 1 tablet (8 mg total) by mouth every 8 (eight) hours as needed for nausea or vomiting. 20 tablet 0  . OVER THE COUNTER MEDICATION Take 500 mg by mouth 3 (three) times daily. Bariatric calcium 500mg      . Probiotic Product (PROBIOTIC ADVANCED PO) Take by mouth.    . Spirulina 500 MG TABS Take 1 tablet by mouth daily.     No current facility-administered medications for this visit.     ALLERGIES: Iohexol and Pyridium [phenazopyridine hcl]  Family History  Problem Relation Age of Onset   . Diabetes Father   . Thyroid disease Father   . Hypertension Father   . Parkinson's disease Father   . Dementia Father   . Hyperlipidemia Father   . Heart disease Father   . Sleep apnea Father   . Obesity Father   . Diabetes Maternal Grandmother   . Heart disease Maternal Grandmother   . Diabetes Maternal Grandfather   . Heart disease Maternal Grandfather   . Stroke Maternal Grandfather   .  Diabetes Paternal Grandmother   . Diabetes Paternal Grandfather   . Heart disease Paternal Grandfather   . Alcohol abuse Paternal Grandfather   . Hypertension Mother   . Diabetes Mother   . Depression Mother   . Obesity Mother   . Colon cancer Maternal Uncle     Social History   Socioeconomic History  . Marital status: Married    Spouse name: Laron Pique  . Number of children: 1  . Years of education: Not on file  . Highest education level: Not on file  Occupational History  . 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 Smoking status: Former Smoker    Packs/day: 0.25    Years: 19.00    Pack years: 4.75    Types: Cigarettes    Quit date: 06/06/2016    Years since quitting: 3.0  . Smokeless tobacco: Never Used  Substance and Sexual Activity  . Alcohol use: Yes    Comment: 1 a month  . Drug use: No  . Sexual activity: Yes    Partners: Male    Birth control/protection: I.U.D.  Other Topics Concern  . Not on file  Social History Narrative  . Not on file   Social Determinants of Health   Financial Resource Strain:   . Difficulty of Paying Living Expenses: Not on file  Food Insecurity:   . Worried About Charity fundraiser in the Last Year: Not on file  . Ran Out of Food in the Last Year: Not on file  Transportation Needs:   . Lack of Transportation (Medical): Not on file  . Lack of Transportation (Non-Medical): Not on file  Physical Activity:   . Days of Exercise per Week: Not on file  . Minutes of Exercise per Session: Not  on file  Stress:   . Feeling of Stress : Not on file  Social Connections:   . Frequency of Communication with Friends and Family: Not on file  . Frequency of Social Gatherings with Friends and Family: Not on file  . Attends Religious Services: Not on file  . Active Member of Clubs or Organizations: Not on file  . Attends Archivist Meetings: Not on file  . Marital Status: Not on file  Intimate Partner Violence:   . Fear of Current or Ex-Partner: Not on file  . Emotionally Abused: Not on file  . Physically Abused: Not on file  . Sexually Abused: Not on file    Review of Systems  All other systems reviewed and are negative.   PHYSICAL EXAMINATION:    BP 110/62   Pulse 78   Temp 98.4 F (36.9 C)   Ht 5\' 8"  (1.727 m)   Wt 245 lb 9.6 oz (111.4 kg)   SpO2 97%   BMI 37.34 kg/m     General appearance: alert, cooperative and appears stated age   Ultrasound images reviewed with the patient. Normal sized uterus, thin endometrium, IUD in place, absent left adnexa, right ovary with a 3+ cm simple cyst, most c/w a follicle  ASSESSMENT Intermittent abdominal/pelvic pain, 3+ simple right ovarian cyst on ultrasound.     PLAN We discussed that the cyst is c/w a functional ovarian cyst. Doubt it is the source of her several month h/o pain. She will f/u with her primary If primary doesn't find an etiology of her pain and her pain persists, we can always repeat the ultrasound.    An After  Visit Summary was printed and given to the patient.
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
## 2019-06-18 NOTE — Progress Notes (Signed)
Chief Complaint:   OBESITY [name] [name] (MR# [id number]) is a 39 y.o. female who presents for evaluation and treatment of obesity and related comorbidities. Current BMI is Body mass index is 36.95 kg/m. [name] has been struggling with her weight for many years and has been unsuccessful in either losing weight, maintaining weight loss, or reaching her healthy weight goal.  [name] is status post gastric sleeve in 2018 by Dr. Hassell Done. She has gone from 327 lbs to 221 lbs in 9 months. She started regaining in 15 months, worsening weight gain with COVID19.  [name] is currently in the action stage of change and ready to dedicate time achieving and maintaining a healthier weight. [name] is interested in becoming our patient and working on intensive lifestyle modifications including (but not limited to) diet and exercise for weight loss.  [name]'s habits were reviewed today and are as follows: Her family eats meals together, she thinks her family will eat healthier with her, her desired weight loss is 68 lbs, she has been heavy most of her life, she started gaining weight at 64-45 years old, her heaviest weight ever was 328 pounds, she has significant food cravings issues, she snacks frequently in the evenings, she wakes up frequently in the middle of the night to eat, she skips meals frequently, she is frequently drinking liquids with calories, she frequently makes poor food choices and she struggles with emotional eating.  Depression Screen [name]'s Food and Mood (modified PHQ-9) score was 8.  Depression screen PHQ 2/9 06/18/2019  Decreased Interest 1  Down, Depressed, Hopeless -  PHQ - 2 Score 1  Altered sleeping 1  Tired, decreased energy 2  Change in appetite 2  Feeling bad or failure about yourself  1  Trouble concentrating 1  Moving slowly or fidgety/restless 0  Suicidal thoughts 0  PHQ-9 Score 8  Difficult doing work/chores Not difficult at all  Some recent data might be  hidden   Subjective:   1. Other fatigue [name] admits to daytime somnolence and admits to waking up still tired. Patent has a history of symptoms of daytime fatigue. [name] generally gets 5 or 7 hours of sleep per night, and states that she has nightime awakenings. Snoring is not present. Apneic episodes are not present. Epworth Sleepiness Score is 4.  2. Shortness of breath on exertion [name] notes increasing shortness of breath with exercising and seems to be worsening over time with weight gain. She notes getting out of breath sooner with activity than she used to. This has not gotten worse recently. [name] denies shortness of breath at rest or orthopnea.  3. Type 2 diabetes mellitus without complication, without long-term current use of insulin (HCC) [name] has a history of diabetes mellitus which has bee controlled since her weight loss surgery. Her highest A1c in Epic was 7.2 in 2018.  4. Vitamin D deficiency [place] is on Vit D, but her level is note yet at goal. She notes fatigue.  5. At risk for malnutrition [name] is at increased risk for malnutrition due to weight loss surgery.   Assessment/Plan:   1. Other fatigue [name] does feel that her weight is causing her energy to be lower than it should be. Fatigue may be related to obesity, depression or many other causes. Labs will be ordered, and in the meanwhile, [name] will focus on self care including making healthy food choices, increasing physical activity and focusing on stress reduction.  - EKG 12-Lead - CBC with  Differential/Platelet - Comprehensive metabolic panel - Folate - Lipid Panel With LDL/HDL Ratio - TSH - T3 - T4, free  2. Shortness of breath on exertion [name] does feel that she gets out of breath more easily that she used to when she exercises. [name]'s shortness of breath appears to be obesity related and exercise induced. She has agreed to work on weight loss and gradually increase exercise to  treat her exercise induced shortness of breath. Will continue to monitor closely.  3. Type 2 diabetes mellitus without complication, without long-term current use of insulin (HCC) Good blood sugar control is important to decrease the likelihood of diabetic complications such as nephropathy, neuropathy, limb loss, blindness, coronary artery disease, and death. Intensive lifestyle modification including diet, exercise and weight loss are the first line of treatment for diabetes. Damyria will start her diet prescription, and we will check labs today.  - Hemoglobin A1c - Insulin, random - Vitamin B12  4. Vitamin D deficiency Low Vitamin D level contributes to fatigue and are associated with obesity, breast, and colon cancer. We will check labs today. Yosan will follow-up for routine testing of Vitamin D, at least 2-3 times per year to avoid over-replacement.  - VITAMIN D 25 Hydroxy (Vit-D Deficiency, Fractures)  5. Depression screening [name] had a positive depression screening. Depression is commonly associated with obesity and often results in emotional eating behaviors. We will monitor this closely and work on CBT to help improve the non-hunger eating patterns. Referral to Psychology may be required if no improvement is seen as she continues in our clinic.  6. At risk for malnutrition [name] was given approximately 30 minutes of counseling today regarding prevention of malnutrition. [name] was advised that having bariatric surgery increases her risk for anemia, malnutrition, and vitamin deficiencies.   7. Class 2 severe obesity with serious comorbidity and body mass index (BMI) of 37.0 to 37.9 in adult, unspecified obesity type (HCC) [name] is currently in the action stage of change and her goal is to continue with weight loss efforts. I recommend [name] begin the structured treatment plan as follows:  She has agreed to the Category 2 Plan.  Exercise goals: No exercise has been  prescribed at this time.   Behavioral modification strategies: increasing lean protein intake.  She was informed of the importance of frequent follow-up visits to maximize her success with intensive lifestyle modifications for her multiple health conditions. She was informed we would discuss her lab results at her next visit unless there is a critical issue that needs to be addressed sooner. Crestina agreed to keep her next visit at the agreed upon time to discuss these results.  Objective:   Blood pressure 109/72, pulse (!) 59, temperature 97.8 F (36.6 C), temperature source Oral, height 5\' 8"  (1.727 m), weight 243 lb (110.2 kg), SpO2 98 %. Body mass index is 36.95 kg/m.  EKG: Normal sinus rhythm, rate 63 BPM.  Indirect Calorimeter completed today shows a VO2 of 257 and a REE of 1792.  Her calculated basal metabolic rate is [id number] thus her basal metabolic rate is worse than expected.  General: Cooperative, alert, well developed, in no acute distress. HEENT: Conjunctivae and lids unremarkable. Cardiovascular: Regular rhythm.  Lungs: Normal work of breathing. Neurologic: No focal deficits.   Lab Results  Component Value Date   CREATININE 0.77 01/08/2019   BUN 17 01/08/2019   NA 139 01/08/2019   K 4.3 01/08/2019   CL 103 01/08/2019   CO2 24 01/08/2019  Lab Results  Component Value Date   ALT 6 01/08/2019   AST 9 01/08/2019   ALKPHOS 73 01/08/2019   BILITOT 0.5 01/08/2019   Lab Results  Component Value Date   HGBA1C 5.3 01/08/2019   HGBA1C 5.5 05/30/2017   HGBA1C 6.4 01/09/2017   HGBA1C 7.2 (H) 10/10/2016   HGBA1C 6.5 (H) 03/14/2016   No results found for: INSULIN Lab Results  Component Value Date   TSH 1.300 06/04/2019   Lab Results  Component Value Date   CHOL 194 01/08/2019   HDL 52 01/08/2019   LDLCALC 125 (H) 01/08/2019   TRIG 93 01/08/2019   CHOLHDL 3.7 01/08/2019   Lab Results  Component Value Date   WBC 10.3 06/04/2019   HGB 14.7 06/04/2019   HCT  41.8 06/04/2019   MCV 86 06/04/2019   PLT 284 06/04/2019   Lab Results  Component Value Date   FERRITIN 9 (L) 03/14/2016   Attestation Statements:   Reviewed by clinician on day of visit: allergies, medications, problem list, medical history, surgical history, family history, social history, and previous encounter notes.   I, Trixie Dredge, am acting as transcriptionist for Dennard Nip, MD.  I have reviewed the above documentation for accuracy and completeness, and I agree with the above. - Dennard Nip, MD

## 2019-06-19 LAB — CBC WITH DIFFERENTIAL/PLATELET
Basophils Absolute: 0.1 10*3/uL (ref 0.0–0.2)
Basos: 1 %
EOS (ABSOLUTE): 0.2 10*3/uL (ref 0.0–0.4)
Eos: 2 %
Hematocrit: 44.1 % (ref 34.0–46.6)
Hemoglobin: 14.9 g/dL (ref 11.1–15.9)
Immature Grans (Abs): 0 10*3/uL (ref 0.0–0.1)
Immature Granulocytes: 0 %
Lymphocytes Absolute: 2.2 10*3/uL (ref 0.7–3.1)
Lymphs: 23 %
MCH: 29.7 pg (ref 26.6–33.0)
MCHC: 33.8 g/dL (ref 31.5–35.7)
MCV: 88 fL (ref 79–97)
Monocytes Absolute: 0.6 10*3/uL (ref 0.1–0.9)
Monocytes: 6 %
Neutrophils Absolute: 6.4 10*3/uL (ref 1.4–7.0)
Neutrophils: 68 %
Platelets: 301 10*3/uL (ref 150–450)
RBC: 5.02 x10E6/uL (ref 3.77–5.28)
RDW: 12.7 % (ref 11.7–15.4)
WBC: 9.4 10*3/uL (ref 3.4–10.8)

## 2019-06-19 LAB — COMPREHENSIVE METABOLIC PANEL
ALT: 11 IU/L (ref 0–32)
AST: 17 IU/L (ref 0–40)
Albumin/Globulin Ratio: 1.2 (ref 1.2–2.2)
Albumin: 4.4 g/dL (ref 3.8–4.8)
Alkaline Phosphatase: 83 IU/L (ref 39–117)
BUN/Creatinine Ratio: 16 (ref 9–23)
BUN: 14 mg/dL (ref 6–20)
Bilirubin Total: 0.4 mg/dL (ref 0.0–1.2)
CO2: 20 mmol/L (ref 20–29)
Calcium: 9.6 mg/dL (ref 8.7–10.2)
Chloride: 103 mmol/L (ref 96–106)
Creatinine, Ser: 0.89 mg/dL (ref 0.57–1.00)
GFR calc Af Amer: 95 mL/min/{1.73_m2} (ref >59)
GFR calc non Af Amer: 82 mL/min/{1.73_m2} (ref >59)
Globulin, Total: 3.6 g/dL (ref 1.5–4.5)
Glucose: 90 mg/dL (ref 65–99)
Potassium: 4.4 mmol/L (ref 3.5–5.2)
Sodium: 139 mmol/L (ref 134–144)
Total Protein: 8 g/dL (ref 6.0–8.5)

## 2019-06-19 LAB — FOLATE: Folate: >20 ng/mL (ref >3.0)

## 2019-06-19 LAB — INSULIN, RANDOM: INSULIN: 10.1 u[IU]/mL (ref 2.6–24.9)

## 2019-06-19 LAB — HEMOGLOBIN A1C
Est. average glucose Bld gHb Est-mCnc: 117 mg/dL
Hgb A1c MFr Bld: 5.7 % — ABNORMAL HIGH (ref 4.8–5.6)

## 2019-06-19 LAB — T4, FREE: Free T4: 1.51 ng/dL (ref 0.82–1.77)

## 2019-06-19 LAB — LIPID PANEL WITH LDL/HDL RATIO
Cholesterol, Total: 204 mg/dL — ABNORMAL HIGH (ref 100–199)
HDL: 58 mg/dL (ref >39)
LDL Chol Calc (NIH): 129 mg/dL — ABNORMAL HIGH (ref 0–99)
LDL/HDL Ratio: 2.2 ratio (ref 0.0–3.2)
Triglycerides: 93 mg/dL (ref 0–149)
VLDL Cholesterol Cal: 17 mg/dL (ref 5–40)

## 2019-06-19 LAB — TSH: TSH: 0.702 u[IU]/mL (ref 0.450–4.500)

## 2019-06-19 LAB — VITAMIN D 25 HYDROXY (VIT D DEFICIENCY, FRACTURES): Vit D, 25-Hydroxy: 33.3 ng/mL (ref 30.0–100.0)

## 2019-06-19 LAB — T3: T3, Total: 115 ng/dL (ref 71–180)

## 2019-06-19 LAB — VITAMIN B12: Vitamin B-12: 437 pg/mL (ref 232–1245)

## 2019-06-24 ENCOUNTER — Ambulatory Visit (INDEPENDENT_AMBULATORY_CARE_PROVIDER_SITE_OTHER): Payer: No Typology Code available for payment source | Admitting: Psychology

## 2019-06-24 ENCOUNTER — Other Ambulatory Visit: Payer: Self-pay

## 2019-06-24 DIAGNOSIS — F4323 Adjustment disorder with mixed anxiety and depressed mood: Secondary | ICD-10-CM

## 2019-06-24 DIAGNOSIS — F509 Eating disorder, unspecified: Secondary | ICD-10-CM

## 2019-06-25 NOTE — Progress Notes (Signed)
  Office: [phone]  /  Fax: [phone]    Date: July 09, 2019   Appointment Start Time: 4:00pm Duration: 27 minutes Provider: Glennie Isle, Psy.D. Type of Session: Individual Therapy  Location of Patient: Parked in car at work Location of Provider: Provider's Home Type of Contact: Telepsychological Visit via [employer]  Session Content: [name] [name] is a 39 y.o. female presenting via [place] for a follow-up appointment to address the previously established treatment goal of decreasing emotional eating. Today's appointment was a telepsychological visit due to COVID-19. [name] provided verbal consent for today's telepsychological appointment and she is aware she is responsible for securing confidentiality on her end of the session. Prior to proceeding with today's appointment, [name]'s physical location at the time of this appointment was obtained as well a phone number she could be reached at in the event of technical difficulties. Ailany and this provider participated in today's telepsychological service.   This provider conducted a brief check-in. [name] shared she is "doing pretty good." Emotional and physical hunger was reviewed. She acknowledged she is having difficulties with sweets. Psychoeducation regarding triggers for emotional eating was provided. [name] was provided a handout, and encouraged to utilize the handout between now and the next appointment to increase awareness of triggers and frequency. [name] agreed. This provider also discussed behavioral strategies for specific triggers, such as placing the utensil down when conversing to avoid mindless eating. [name] provided verbal consent during today's appointment for this provider to send a handout about triggers via e-mail. [name] was receptive to today's appointment as evidenced by openness to sharing, responsiveness to feedback, and willingness to explore triggers for emotional eating.  Mental Status Examination:    Appearance: well groomed and appropriate hygiene  Behavior: appropriate to circumstances Mood: euthymic Affect: mood congruent Speech: normal in rate, volume, and tone Eye Contact: appropriate Psychomotor Activity: appropriate Gait: unable to assess Thought Process: linear, logical, and goal directed  Thought Content/Perception: no hallucinations, delusions, bizarre thinking or behavior reported or observed and no evidence of suicidal and homicidal ideation, plan, and intent Orientation: time, person, place and purpose of appointment Memory/Concentration: memory, attention, language, and fund of knowledge intact  Insight/Judgment: good  Interventions:  Conducted a brief chart review Provided empathic reflections and validation Reviewed content from the previous session Employed supportive psychotherapy interventions to facilitate reduced distress and to improve coping skills with identified stressors Employed motivational interviewing skills to assess patient's willingness/desire to adhere to recommended medical treatments and assignments Psychoeducation provided regarding triggers for emotional eating  DSM-5 Diagnoses: 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 [name] has demonstrated progress in her goal as evidenced by increased awareness of hunger patterns.   Plan: The next appointment will be scheduled in approximately two weeks, which will be via [employer]. The next session will focus on working towards the established treatment goal.

## 2019-07-02 ENCOUNTER — Other Ambulatory Visit: Payer: Self-pay

## 2019-07-02 ENCOUNTER — Encounter (INDEPENDENT_AMBULATORY_CARE_PROVIDER_SITE_OTHER): Payer: Self-pay | Admitting: Family Medicine

## 2019-07-02 ENCOUNTER — Ambulatory Visit (INDEPENDENT_AMBULATORY_CARE_PROVIDER_SITE_OTHER): Payer: No Typology Code available for payment source | Admitting: Family Medicine

## 2019-07-02 VITALS — BP 115/67 | HR 68 | Temp 98.6°F | Ht 68.0 in | Wt 241.0 lb

## 2019-07-02 DIAGNOSIS — E1169 Type 2 diabetes mellitus with other specified complication: Secondary | ICD-10-CM | POA: Diagnosis not present

## 2019-07-02 DIAGNOSIS — E785 Hyperlipidemia, unspecified: Secondary | ICD-10-CM

## 2019-07-02 DIAGNOSIS — E559 Vitamin D deficiency, unspecified: Secondary | ICD-10-CM

## 2019-07-02 DIAGNOSIS — E119 Type 2 diabetes mellitus without complications: Secondary | ICD-10-CM | POA: Diagnosis not present

## 2019-07-02 DIAGNOSIS — Z9189 Other specified personal risk factors, not elsewhere classified: Secondary | ICD-10-CM

## 2019-07-02 DIAGNOSIS — Z6836 Body mass index (BMI) 36.0-36.9, adult: Secondary | ICD-10-CM

## 2019-07-02 MED ORDER — RYBELSUS 3 MG PO TABS: 3.0000 mg | ORAL_TABLET | Freq: Every day | ORAL | 0 refills | Status: DC

## 2019-07-02 MED ORDER — VITAMIN D (ERGOCALCIFEROL) 1.25 MG (50000 UNIT) PO CAPS: 50000.0000 [IU] | ORAL_CAPSULE | ORAL | 0 refills | Status: DC

## 2019-07-02 MED FILL — VIT D2 1.25 MG (50,000 UNIT: 1.25 MG | 28 days supply | Qty: 4 | Fill #0

## 2019-07-02 MED FILL — RYBELSUS 3 MG TABS: 3 | 30 days supply | Qty: 30 | Fill #0

## 2019-07-06 NOTE — Progress Notes (Signed)
Chief Complaint:   OBESITY [name] [name] is here to discuss her progress with her obesity treatment plan along with follow-up of her obesity related diagnoses. [name] [name] is on the Category 2 Plan and states she is following her eating plan approximately 85% of the time. [name] [name] states she is walking for 30 minutes 3 times per week.  Today's visit was #: 2 Starting weight: 243 lbs Starting date: 06/18/2019 Today's weight: 241 lbs Today's date: 07/02/2019 Total lbs lost to date: 2 Total lbs lost since last in-office visit: 2  Interim History: [name] [name] did pretty well following her Category 2 plan. She spread her food out in order to eat mostly everything, and her hunger was controlled.  Subjective:   1. Hyperlipidemia associated with type 2 diabetes mellitus ([name] [name]) [name] [name] is not on statin, and her LDL is elevated. She is attempting to improve with diet.  2. Vitamin D deficiency [name] [name] is not on Vit D, and her level is less than ideal. She notes fatigue.  3. Type 2 diabetes mellitus without complication, without long-term current use of insulin (HCC) [name] [name] has been controlling with diet and gastric bypass, but her A1c is elevated and her fasting insulin is also elevated.  4. At risk for hyperglycemia [name] [name] is at increased risk for hyperglycemia due to previous weight gain and elevated A1c.  Assessment/Plan:   1. Hyperlipidemia associated with type 2 diabetes mellitus ([place]) Cardiovascular risk and specific lipid/LDL goals reviewed. We discussed several lifestyle modifications today and [name] [name] will continue to work on diet, exercise and weight loss efforts. We will recheck labs in 3 months. Orders and follow up as documented in patient record.   2. Vitamin D deficiency Low Vitamin D level contributes to fatigue and are associated with obesity, breast, and colon cancer. [name] [name] agreed to start prescription Vitamin D 50,000 IU every week with no refill. She will follow-up for routine  testing of Vitamin D, at least 2-3 times per year to avoid over-replacement.  - Vitamin D, Ergocalciferol, (DRISDOL) 1.25 MG (50000 UNIT) CAPS capsule; Take 1 capsule (50,000 Units total) by mouth every 7 (seven) days.  Dispense: 4 capsule; Refill: 0  3. Type 2 diabetes mellitus without complication, without long-term current use of insulin (HCC) Good blood sugar control is important to decrease the likelihood of diabetic complications such as nephropathy, neuropathy, limb loss, blindness, coronary artery disease, and death. Intensive lifestyle modification including diet, exercise and weight loss are the first line of treatment for diabetes. [name] [name] agreed to start Rybelsus 3 mg daily with no refill. We will continue to monitor.  - Semaglutide (RYBELSUS) 3 MG TABS; Take 3 mg by mouth daily.  Dispense: 30 tablet; Refill: 0  4. At risk for hyperglycemia [name] [name] was given approximately 30 minutes of counseling today regarding prevention of hyperglycemia. She was advised of hyperglycemia causes and the fact hyperglycemia is often asymptomatic. [name] [name] was instructed to avoid skipping meals, eat regular protein rich meals and schedule low calorie but protein rich snacks as needed.   Repetitive spaced learning was employed today to elicit superior memory formation and behavioral change  5. Class 2 severe obesity with serious comorbidity and body mass index (BMI) of 36.0 to 36.9 in adult, unspecified obesity type (HCC) [name] [name] is currently in the action stage of change. As such, her goal is to continue with weight loss efforts. She has agreed to the Category 2 Plan.   Exercise goals: [name] [name] is to continue her current exercise regimen as is.  Behavioral  modification strategies: increasing lean protein intake and decreasing simple carbohydrates.  [name] [name] has agreed to follow-up with our clinic in 2 weeks. She was informed of the importance of frequent follow-up visits to maximize her success with  intensive lifestyle modifications for her multiple health conditions.   Objective:   Blood pressure 115/67, pulse 68, temperature 98.6 F (37 C), temperature source Oral, height 5\' 8"  (1.727 m), weight 241 lb (109.3 kg), SpO2 100 %. Body mass index is 36.64 kg/m.  General: Cooperative, alert, well developed, in no acute distress. HEENT: Conjunctivae and lids unremarkable. Cardiovascular: Regular rhythm.  Lungs: Normal work of breathing. Neurologic: No focal deficits.   Lab Results  Component Value Date   CREATININE 0.89 06/18/2019   BUN 14 06/18/2019   NA 139 06/18/2019   K 4.4 06/18/2019   CL 103 06/18/2019   CO2 20 06/18/2019   Lab Results  Component Value Date   ALT 11 06/18/2019   AST 17 06/18/2019   ALKPHOS 83 06/18/2019   BILITOT 0.4 06/18/2019   Lab Results  Component Value Date   HGBA1C 5.7 (H) 06/18/2019   HGBA1C 5.3 01/08/2019   HGBA1C 5.5 05/30/2017   HGBA1C 6.4 01/09/2017   HGBA1C 7.2 (H) 10/10/2016   Lab Results  Component Value Date   INSULIN 10.1 06/18/2019   Lab Results  Component Value Date   TSH 0.702 06/18/2019   Lab Results  Component Value Date   CHOL 204 (H) 06/18/2019   HDL 58 06/18/2019   LDLCALC 129 (H) 06/18/2019   TRIG 93 06/18/2019   CHOLHDL 3.7 01/08/2019   Lab Results  Component Value Date   WBC 9.4 06/18/2019   HGB 14.9 06/18/2019   HCT 44.1 06/18/2019   MCV 88 06/18/2019   PLT 301 06/18/2019   Lab Results  Component Value Date   FERRITIN 9 (L) 03/14/2016   Attestation Statements:   Reviewed by clinician on day of visit: allergies, medications, problem list, medical history, surgical history, family history, social history, and previous encounter notes.   I, Trixie Dredge, am acting as transcriptionist for Dennard Nip, MD.  I have reviewed the above documentation for accuracy and completeness, and I agree with the above. -  Dennard Nip, MD

## 2019-07-09 ENCOUNTER — Ambulatory Visit (INDEPENDENT_AMBULATORY_CARE_PROVIDER_SITE_OTHER): Payer: No Typology Code available for payment source | Admitting: Psychology

## 2019-07-09 ENCOUNTER — Other Ambulatory Visit: Payer: Self-pay

## 2019-07-09 DIAGNOSIS — F4323 Adjustment disorder with mixed anxiety and depressed mood: Secondary | ICD-10-CM | POA: Diagnosis not present

## 2019-07-09 DIAGNOSIS — F509 Eating disorder, unspecified: Secondary | ICD-10-CM

## 2019-07-13 NOTE — Progress Notes (Unsigned)
  Office: [phone]  /  Fax: [phone]    Date: July 27, 2019   Appointment Start Time: *** Duration: *** minutes Provider: Glennie Isle, Psy.D. Type of Session: Individual Therapy  Location of Patient: {gbptloc:23249} Location of Provider: {Location of Service:22491} Type of Contact: Telepsychological Visit via {gbtelepsych:23399}  Session Content:This provider called [name] at 4:32pm as she did not present for the WebEx appointment. A HIPAA compliant voicemail was left requesting a call back. The e-mail with the secure link was re-sent. As such, today's appointment was initiated *** minutes late.  [name] [name] is a 39 y.o. female presenting via {gbtelepsych:23399} for a follow-up appointment to address the previously established treatment goal of decreasing emotional eating. Today's appointment was a telepsychological visit due to COVID-19. [name] provided verbal consent for today's telepsychological appointment and she is aware she is responsible for securing confidentiality on her end of the session. Prior to proceeding with today's appointment, [name]'s physical location at the time of this appointment was obtained as well a phone number she could be reached at in the event of technical difficulties. [name] and this provider participated in today's telepsychological service.   This provider conducted a brief check-in. ***Psychoeducation regarding mindfulness was provided. A handout was provided to [place] with further information regarding mindfulness, including exercises. This provider also explained the benefit of mindfulness as it relates to emotional eating. [name] was encouraged to engage in the provided exercises between now and the next appointment with this provider. [name] agreed. During today's appointment, [name] was led through a mindfulness exercise involving her senses. [name] provided verbal consent during today's appointment for this provider to send a handout about  mindfulness via e-mail. [name] was receptive to today's appointment as evidenced by openness to sharing, responsiveness to feedback, and {gbreceptiveness:23401}.  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 [name] has demonstrated progress in her goal as evidenced by {gbtxprogress:22839}. [name] also {gbtxprogress2:22951}.  Plan: The next appointment will be scheduled in {gbweeks:21758}, which will be {gbtxmodality:23402}. The next session will focus on {Plan for Next Appointment:23400}.

## 2019-07-15 ENCOUNTER — Ambulatory Visit: Payer: Managed Care, Other (non HMO) | Admitting: Adult Health

## 2019-07-20 ENCOUNTER — Encounter (INDEPENDENT_AMBULATORY_CARE_PROVIDER_SITE_OTHER): Payer: Self-pay | Admitting: Family Medicine

## 2019-07-20 ENCOUNTER — Other Ambulatory Visit: Payer: Self-pay

## 2019-07-20 ENCOUNTER — Ambulatory Visit (INDEPENDENT_AMBULATORY_CARE_PROVIDER_SITE_OTHER): Payer: No Typology Code available for payment source | Admitting: Family Medicine

## 2019-07-20 VITALS — BP 118/75 | HR 68 | Temp 98.2°F | Ht 68.0 in | Wt 238.0 lb

## 2019-07-20 DIAGNOSIS — Z6836 Body mass index (BMI) 36.0-36.9, adult: Secondary | ICD-10-CM

## 2019-07-20 DIAGNOSIS — E119 Type 2 diabetes mellitus without complications: Secondary | ICD-10-CM | POA: Diagnosis not present

## 2019-07-20 DIAGNOSIS — Z9189 Other specified personal risk factors, not elsewhere classified: Secondary | ICD-10-CM | POA: Diagnosis not present

## 2019-07-20 DIAGNOSIS — R1013 Epigastric pain: Secondary | ICD-10-CM | POA: Diagnosis not present

## 2019-07-20 NOTE — Progress Notes (Signed)
Chief Complaint:   OBESITY [name] [name] is here to discuss her progress with her obesity treatment plan along with follow-up of her obesity related diagnoses. [name] [name] is on the Category 2 Plan and states she is following her eating plan approximately 95% of the time. [name] [name] states she is doing cardio and resistance training for 30-45 minutes 3 times per week.  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 She is able to eat about 4 oz of meat at one time. Her top weight was 327 lbs, nadir 221 lbs. She began regaining at about 15 months post-op. Her hunger is satisfied. She does not always eat the bread on the plan because she is too full after the protein.   Subjective:   1. Type 2 diabetes mellitus without complication, without long-term current use of insulin (HCC) [name] [name] is on Rybelsus 3 mg (started 1 week ago). She has a history of pancreatitis. She reports abdominal pain for the past few days. Her diabetes is well controlled.  2. Epigastric pain [name] [name] has had epigastric pain that radiates left and right laterally for the last 3 days. She has a history of pancreatitis (unknown etiology). No mention of nausea.  3. At risk for deficient intake of food The patient is at a higher than average risk of deficient intake of food due to history of sleeve gastrectomy.  Assessment/Plan:   1. Type 2 diabetes mellitus without complication, without long-term current use of insulin (HCC) Good blood sugar control is important to decrease the likelihood of diabetic complications such as nephropathy, neuropathy, limb loss, blindness, coronary artery disease, and death. Intensive lifestyle modification including diet, exercise and weight loss  are the first line of treatment for diabetes. [name] [name] is to stop Rybelsus for now, and will follow up.  - Amylase - Lipase - CMET  2. Epigastric pain We will check labs today. [name] [name] is to stop Rybelsus for now.  - Amylase - Lipase - Comprehensive metabolic panel  3. At risk for deficient intake of food [name] [name] was given approximately 15 minutes of deficit intake of food prevention counseling today. [name] [name] is at risk for protein deficeiency. She was encouraged to focus on meeting caloric and protein goals according to her recommended meal plan.   4. Class 2 severe obesity with serious comorbidity and body mass index (BMI) of 36.0 to 36.9 in adult, unspecified obesity type (HCC) [name] [name] is currently in the action stage of change. As such, her goal is to continue with weight loss efforts. She has agreed to the Category 2 Plan.   Handouts given today: Protein equivalents.  Exercise goals: As is.  Behavioral modification strategies: increasing lean protein intake.  [name] [name] has agreed to follow-up with our clinic in 2 weeks. She was informed of the importance of frequent follow-up visits to maximize her success with intensive lifestyle modifications for her multiple health conditions.   [name] [name] was informed we would discuss her lab results at her next visit unless there is a critical issue that needs to be addressed sooner. [name] [name] agreed to keep her next visit at the agreed upon time to discuss these results.  Objective:   Blood pressure 118/75, pulse 68, temperature 98.2 F (36.8 C), temperature source Oral, height 5\' 8"  (1.727  m), weight 238 lb (108 kg), SpO2 99 %. Body mass index is 36.19 kg/m.  General: Cooperative, alert, well developed, in no acute distress. HEENT: Conjunctivae and lids unremarkable. Cardiovascular: Regular rhythm.  Lungs: Normal work of breathing. Neurologic: No focal deficits.   Lab Results  Component Value Date   CREATININE 0.89 06/18/2019   BUN 14  06/18/2019   NA 139 06/18/2019   K 4.4 06/18/2019   CL 103 06/18/2019   CO2 20 06/18/2019   Lab Results  Component Value Date   ALT 11 06/18/2019   AST 17 06/18/2019   ALKPHOS 83 06/18/2019   BILITOT 0.4 06/18/2019   Lab Results  Component Value Date   HGBA1C 5.7 (H) 06/18/2019   HGBA1C 5.3 01/08/2019   HGBA1C 5.5 05/30/2017   HGBA1C 6.4 01/09/2017   HGBA1C 7.2 (H) 10/10/2016   Lab Results  Component Value Date   INSULIN 10.1 06/18/2019   Lab Results  Component Value Date   TSH 0.702 06/18/2019   Lab Results  Component Value Date   CHOL 204 (H) 06/18/2019   HDL 58 06/18/2019   LDLCALC 129 (H) 06/18/2019   TRIG 93 06/18/2019   CHOLHDL 3.7 01/08/2019   Lab Results  Component Value Date   WBC 9.4 06/18/2019   HGB 14.9 06/18/2019   HCT 44.1 06/18/2019   MCV 88 06/18/2019   PLT 301 06/18/2019   Lab Results  Component Value Date   FERRITIN 9 (L) 03/14/2016   Attestation Statements:   Reviewed by clinician on day of visit: allergies, medications, problem list, medical history, surgical history, family history, social history, and previous encounter notes.   Wilhemena Durie, am acting as Location manager for [employer], FNP-C.  I have reviewed the above documentation for accuracy and completeness, and I agree with the above. -  Georgianne Fick, FNP

## 2019-07-21 ENCOUNTER — Encounter (INDEPENDENT_AMBULATORY_CARE_PROVIDER_SITE_OTHER): Payer: Self-pay | Admitting: Family Medicine

## 2019-07-21 LAB — COMPREHENSIVE METABOLIC PANEL
ALT: 10 IU/L (ref 0–32)
AST: 13 IU/L (ref 0–40)
Albumin/Globulin Ratio: 1.4 (ref 1.2–2.2)
Albumin: 4 g/dL (ref 3.8–4.8)
Alkaline Phosphatase: 74 IU/L (ref 39–117)
BUN/Creatinine Ratio: 15 (ref 9–23)
BUN: 13 mg/dL (ref 6–20)
Bilirubin Total: 0.4 mg/dL (ref 0.0–1.2)
CO2: 21 mmol/L (ref 20–29)
Calcium: 9.7 mg/dL (ref 8.7–10.2)
Chloride: 108 mmol/L — ABNORMAL HIGH (ref 96–106)
Creatinine, Ser: 0.84 mg/dL (ref 0.57–1.00)
GFR calc Af Amer: 102 mL/min/{1.73_m2} (ref >59)
GFR calc non Af Amer: 88 mL/min/{1.73_m2} (ref >59)
Globulin, Total: 2.8 g/dL (ref 1.5–4.5)
Glucose: 83 mg/dL (ref 65–99)
Potassium: 4.1 mmol/L (ref 3.5–5.2)
Sodium: 145 mmol/L — ABNORMAL HIGH (ref 134–144)
Total Protein: 6.8 g/dL (ref 6.0–8.5)

## 2019-07-21 LAB — AMYLASE: Amylase: 34 U/L (ref 31–110)

## 2019-07-21 LAB — LIPASE: Lipase: 26 U/L (ref 14–72)

## 2019-07-21 NOTE — Telephone Encounter (Signed)
Please advise 

## 2019-07-24 ENCOUNTER — Encounter: Payer: Self-pay | Admitting: Certified Nurse Midwife

## 2019-07-27 ENCOUNTER — Telehealth (INDEPENDENT_AMBULATORY_CARE_PROVIDER_SITE_OTHER): Payer: Self-pay | Admitting: Psychology

## 2019-07-27 ENCOUNTER — Encounter (INDEPENDENT_AMBULATORY_CARE_PROVIDER_SITE_OTHER): Payer: Self-pay

## 2019-07-27 ENCOUNTER — Other Ambulatory Visit: Payer: Self-pay

## 2019-07-27 ENCOUNTER — Ambulatory Visit (INDEPENDENT_AMBULATORY_CARE_PROVIDER_SITE_OTHER): Payer: No Typology Code available for payment source | Admitting: Psychology

## 2019-07-27 NOTE — Telephone Encounter (Signed)
  Office: [phone]  /  Fax: [phone]  Date of Call: July 27, 2019  Time of Call: 4:32pm Provider: Glennie Isle, PsyD  CONTENT: This provider called [name] to check-in as she did not present for today's Webex appointment at 4:30pm. A HIPAA compliant voicemail was left requesting a call back. Of note, this provider stayed on the WebEx appointment for 5 minutes prior to signing off per the clinic's grace period policy.    PLAN: This provider will wait for [name] to call back. No further follow-up planned by this provider.

## 2019-08-05 ENCOUNTER — Encounter (INDEPENDENT_AMBULATORY_CARE_PROVIDER_SITE_OTHER): Payer: Self-pay | Admitting: Family Medicine

## 2019-08-05 ENCOUNTER — Ambulatory Visit (INDEPENDENT_AMBULATORY_CARE_PROVIDER_SITE_OTHER): Payer: No Typology Code available for payment source | Admitting: Family Medicine

## 2019-08-05 ENCOUNTER — Other Ambulatory Visit: Payer: Self-pay

## 2019-08-05 VITALS — BP 107/70 | HR 52 | Temp 98.1°F | Ht 68.0 in | Wt 234.0 lb

## 2019-08-05 DIAGNOSIS — K5900 Constipation, unspecified: Secondary | ICD-10-CM | POA: Diagnosis not present

## 2019-08-05 DIAGNOSIS — E559 Vitamin D deficiency, unspecified: Secondary | ICD-10-CM | POA: Diagnosis not present

## 2019-08-05 DIAGNOSIS — E1169 Type 2 diabetes mellitus with other specified complication: Secondary | ICD-10-CM

## 2019-08-05 DIAGNOSIS — Z6835 Body mass index (BMI) 35.0-35.9, adult: Secondary | ICD-10-CM

## 2019-08-05 MED ORDER — VITAMIN D (ERGOCALCIFEROL) 1.25 MG (50000 UNIT) PO CAPS: 50000.0000 [IU] | ORAL_CAPSULE | ORAL | 0 refills | Status: DC

## 2019-08-05 MED ORDER — METFORMIN HCL 500 MG PO TABS: 500.0000 mg | ORAL_TABLET | Freq: Every morning | ORAL | 0 refills | Status: DC

## 2019-08-05 MED FILL — VIT D2 1.25 MG (50,000 UNIT: 1.25 MG | 28 days supply | Qty: 4 | Fill #0

## 2019-08-05 MED FILL — METFORMIN HCL 500 MG TABS: 500 | 30 days supply | Qty: 30 | Fill #0

## 2019-08-06 DIAGNOSIS — K581 Irritable bowel syndrome with constipation: Secondary | ICD-10-CM | POA: Insufficient documentation

## 2019-08-06 DIAGNOSIS — K5900 Constipation, unspecified: Secondary | ICD-10-CM | POA: Insufficient documentation

## 2019-08-06 DIAGNOSIS — E559 Vitamin D deficiency, unspecified: Secondary | ICD-10-CM | POA: Insufficient documentation

## 2019-08-06 NOTE — Progress Notes (Signed)
Chief Complaint:   OBESITY [name] [name] is here to discuss her progress with her obesity treatment plan along with follow-up of her obesity related diagnoses. [name] [name] is on the Category 2 Plan and states she is following her eating plan approximately 70% of the time. [name] [name] states she is doing cardio and resistance training for 45 minutes 2 times per week.  Today's visit was #: 4 Starting weight: 243 lbs Starting date: 06/18/2019 Today's weight: 234 lbs Today's date: 08/05/2019 Total lbs lost to date: 9 Total lbs lost since last in-office visit: 4  Interim History: [name] is getting more protein in now. She does supplement occasionally with protein shake using snack calories. She is unable to eat large portions of meat at one time due to a history of gastric sleeve but she is reducing the meat into smaller portions.. Overall she is doing quite well on the plan.  Subjective:   1. Type 2 diabetes mellitus without complication, without long-term current use of insulin ([place]) [name] denies polyphagia. Diabetes mellitus is well controlled. Her Rybelsus was discontinued due to abdominal pain.  Lab Results  Component Value Date   HGBA1C 5.7 (H) 06/18/2019    2. Vitamin D deficiency [name] [name]'s Vit D level is not at goal, and is low at 33.3. She is on weekly prescription Vit D.  3. Constipation, unspecified constipation type [name] [name] notes her BMs are less frequent, and she feels she must strain more to have a BM. She is getting adequate water and vegetables in.  Assessment/Plan:   1. Type 2 diabetes mellitus without complication, without long-term current use of insulin (HCC) Good blood sugar control is important to decrease the likelihood of diabetic complications such as nephropathy, neuropathy, limb loss, blindness, coronary artery disease, and death. Intensive lifestyle modification including diet, exercise and weight loss are the first line of treatment for diabetes. Precilla agreed to  start metformin 500 mg daily with no refills.  - metFORMIN (GLUCOPHAGE) 500 MG tablet; Take 1 tablet (500 mg total) by mouth every morning.  Dispense: 30 tablet; Refill: 0  2. Vitamin D deficiency Low Vitamin D level contributes to fatigue and are associated with obesity, breast, and colon cancer. We will refill prescription Vitamin D for 1 month. Charmine will follow-up for routine testing of Vitamin D, at least 2-3 times per year to avoid over-replacement.  - Vitamin D, Ergocalciferol, (DRISDOL) 1.25 MG (50000 UNIT) CAPS capsule; Take 1 capsule (50,000 Units total) by mouth every 7 (seven) days.  Dispense: 4 capsule; Refill: 0  3. Constipation, unspecified constipation type [name] [name] was informed that a decrease in bowel movement frequency is normal while losing weight, but stools should not be hard or painful. She is to use metamucil as needed.   4. Class 2 severe obesity with serious comorbidity and body mass index (BMI) of 35.0 to 35.9 in adult, unspecified obesity type (HCC) [name] [name] is currently in the action stage of change. As such, her goal is to continue with weight loss efforts. She has agreed to the Category 2 Plan.   Exercise goals: As is.  Behavioral modification strategies: increasing lean protein intake.  [name] has agreed to follow-up with our clinic in 2 to 3 weeks. She was informed of the importance of frequent follow-up visits to maximize her success with intensive lifestyle modifications for her multiple health conditions.   Objective:   Blood pressure 107/70, pulse (!) 52, temperature 98.1 F (36.7 C), temperature source Oral, height 5\' 8"  (1.727 m), weight 234  lb (106.1 kg), SpO2 99 %. Body mass index is 35.58 kg/m.  General: Cooperative, alert, well developed, in no acute distress. HEENT: Conjunctivae and lids unremarkable. Cardiovascular: Regular rhythm.  Lungs: Normal work of breathing. Neurologic: No focal deficits.   Lab Results  Component Value Date    CREATININE 0.84 07/20/2019   BUN 13 07/20/2019   NA 145 (H) 07/20/2019   K 4.1 07/20/2019   CL 108 (H) 07/20/2019   CO2 21 07/20/2019   Lab Results  Component Value Date   ALT 10 07/20/2019   AST 13 07/20/2019   ALKPHOS 74 07/20/2019   BILITOT 0.4 07/20/2019   Lab Results  Component Value Date   HGBA1C 5.7 (H) 06/18/2019   HGBA1C 5.3 01/08/2019   HGBA1C 5.5 05/30/2017   HGBA1C 6.4 01/09/2017   HGBA1C 7.2 (H) 10/10/2016   Lab Results  Component Value Date   INSULIN 10.1 06/18/2019   Lab Results  Component Value Date   TSH 0.702 06/18/2019   Lab Results  Component Value Date   CHOL 204 (H) 06/18/2019   HDL 58 06/18/2019   LDLCALC 129 (H) 06/18/2019   TRIG 93 06/18/2019   CHOLHDL 3.7 01/08/2019   Lab Results  Component Value Date   WBC 9.4 06/18/2019   HGB 14.9 06/18/2019   HCT 44.1 06/18/2019   MCV 88 06/18/2019   PLT 301 06/18/2019   Lab Results  Component Value Date   FERRITIN 9 (L) 03/14/2016   Attestation Statements:   Reviewed by clinician on day of visit: allergies, medications, problem list, medical history, surgical history, family history, social history, and previous encounter notes.   Wilhemena Durie, am acting as Location manager for [employer], FNP-C.  I have reviewed the above documentation for accuracy and completeness, and I agree with the above. -  Georgianne Fick, FNP

## 2019-08-12 ENCOUNTER — Other Ambulatory Visit: Payer: Self-pay

## 2019-08-12 ENCOUNTER — Encounter: Payer: Self-pay | Admitting: Obstetrics and Gynecology

## 2019-08-12 ENCOUNTER — Ambulatory Visit (INDEPENDENT_AMBULATORY_CARE_PROVIDER_SITE_OTHER): Payer: No Typology Code available for payment source | Admitting: Obstetrics and Gynecology

## 2019-08-12 ENCOUNTER — Telehealth: Payer: Self-pay | Admitting: Obstetrics and Gynecology

## 2019-08-12 VITALS — BP 110/60 | HR 76 | Temp 98.2°F | Resp 10 | Ht 68.0 in | Wt 235.0 lb

## 2019-08-12 DIAGNOSIS — R102 Pelvic and perineal pain: Secondary | ICD-10-CM

## 2019-08-12 DIAGNOSIS — N949 Unspecified condition associated with female genital organs and menstrual cycle: Secondary | ICD-10-CM | POA: Diagnosis not present

## 2019-08-12 DIAGNOSIS — Z124 Encounter for screening for malignant neoplasm of cervix: Secondary | ICD-10-CM

## 2019-08-12 DIAGNOSIS — N760 Acute vaginitis: Secondary | ICD-10-CM | POA: Diagnosis not present

## 2019-08-12 DIAGNOSIS — Z7251 High risk heterosexual behavior: Secondary | ICD-10-CM | POA: Diagnosis not present

## 2019-08-12 LAB — POCT URINE PREGNANCY: Preg Test, Ur: NEGATIVE

## 2019-08-12 MED ORDER — FLUCONAZOLE 150 MG PO TABS: 150.0000 mg | ORAL_TABLET | Freq: Once | ORAL | 0 refills | Status: AC

## 2019-08-12 MED ORDER — METRONIDAZOLE 500 MG PO TABS: 500.0000 mg | ORAL_TABLET | Freq: Two times a day (BID) | ORAL | 0 refills | Status: DC

## 2019-08-12 MED ORDER — DOXYCYCLINE HYCLATE 100 MG PO CAPS: 100.0000 mg | ORAL_CAPSULE | Freq: Two times a day (BID) | ORAL | 0 refills | Status: DC

## 2019-08-12 MED FILL — DOXYCYCLINE HYC 100 MG CAPS: 100 | 10 days supply | Qty: 20 | Fill #0

## 2019-08-12 MED FILL — METRONIDAZOLE 500 MG TABS: 500 | 7 days supply | Qty: 14 | Fill #0

## 2019-08-12 MED FILL — FLUCONAZOLE 150 MG TABS: 150 | 3 days supply | Qty: 2 | Fill #0

## 2019-08-12 NOTE — Telephone Encounter (Signed)
Patient would like to schedule appointment for today. Sending to triage to assist with scheduling.

## 2019-08-12 NOTE — Telephone Encounter (Signed)
Spoke with pt. Pt reports having burning, itching and pelvic pain x 1 day and started having some discharge since Sat with unknown odor. Denies fever, chills or vaginal bleeding. LMP 3/27 with spotting due to IUD in place.   Pt requesting OV today. Will review with Dr Talbert Nan and will return call to pt with OV date and time. Pt agreeable.   Routing to Dr Talbert Nan

## 2019-08-12 NOTE — Telephone Encounter (Signed)
Spoke back with pt after reviewing with Dr Talbert Nan. OV scheduled for today 08/12/19 at 3:15pm. Pt agreeable to date and time. CPS Neg.   Encounter closed.

## 2019-08-12 NOTE — Progress Notes (Signed)
GYNECOLOGY  VISIT   HPI: 39 y.o.   Married White or Caucasian Not Hispanic or Latino  female   [phone] with Patient's last menstrual period was 07/30/2019.   here for "yellowish-white" discharge, burning, and pelvic pain. Patient would like a pregnancy test.   She had unprotected sex 2 weeks ago, at the end of last week she started having a thick vaginal discharge, burning, slight odor. No itching. She also c/o a low pelvic cramping for the last 4-5 days. The pain is intermittent, 3-4/10 in severity. Lasts for a couple of seconds at a time, but comes on and off throughout the day. Normal BM q day. No bladder c/o.  No regular cycles, but had spotting 3/25-3/29.   She has a mirena IUD, placed in 12/17.  The patient had an ultrasound 2 months ago for pain. IUD in correct position. She had a 3+cm right ovarian cyst. Left adnexa absent. That pain resolved.    GYNECOLOGIC HISTORY: Patient's last menstrual period was 07/30/2019. Contraception:IUD Menopausal hormone therapy: none        OB History    Gravida  3   Para  1   Term  1   Preterm      AB  2   Living  1     SAB  2   TAB      Ectopic      Multiple      Live Births  1              Patient Active Problem List   Diagnosis Date Noted  . Vitamin D deficiency 08/06/2019  . Constipation 08/06/2019  . Endometriosis   . Candidiasis, cutaneous 07/14/2018  . Flu-like symptoms 07/09/2018  . Pharyngitis 07/09/2018  . GAD (generalized anxiety disorder) 06/02/2018  . Dysuria 03/27/2018  . Family history of Parkinsonism 03/27/2018  . Dizziness 03/27/2018  . Bradycardia 03/23/2018  . HLD (hyperlipidemia) 03/23/2018  . Coronary atherosclerosis 03/23/2018  . Chest pain 03/22/2018  . Varicose vein of leg 12/24/2017  . Venous reflux 12/24/2017  . Leukocytosis 09/29/2017  . Nausea 09/29/2017  . Anxiety 09/29/2017  . Pancreatitis 09/28/2017  . Dehydration 03/12/2017  . 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 Sleep apnea 01/09/2017  . Healthcare maintenance 10/18/2016  . Type 2 diabetes mellitus without complication, without long-term current use of insulin ([place]) 10/18/2016  . LOW BACK PAIN, ACUTE 11/20/2006  . POLYCYSTIC OVARY 07/04/2006  . OBESITY, NOS 07/04/2006  . TOBACCO USE, QUIT 07/04/2006    Past Medical History:  Diagnosis Date  . Abnormal uterine bleeding   . Amenorrhea   . Anemia   . Anxiety    panic attacks  . Back pain   . Bradycardia   . Chest pain   . Constipation   . Diabetes ([place])   . Dysmenorrhea   . Endometriosis   . Gallbladder problem   . GERD (gastroesophageal reflux disease)    occasional - diet controlled and tums prn  . HLD (hyperlipidemia)   . HTN (hypertension)   . IBS (irritable bowel syndrome)   . Infertility, female   . Insulin resistance   . Migraine without aura   . Missed abortion    x 2 - both resolved without surgery  . Obesity   . PCOS (polycystic ovarian syndrome)   . PCOS (polycystic ovarian syndrome)   . PONV (postoperative nausea and vomiting)    pe rpatient ; scopalamine patch very helpful   .  Prediabetes   . Sleep apnea   . SVD (spontaneous vaginal delivery)    x 1  . Urinary incontinence   . Vitamin D deficiency     Past Surgical History:  Procedure Laterality Date  . CHOLECYSTECTOMY    . COLONOSCOPY  2017   polyps  . CYSTOSCOPY N/A 10/29/2016   Procedure: CYSTOSCOPY;  Surgeon: Salvadore Dom, MD;  Location: [facility];  Service: Gynecology;  Laterality: N/A;  . DILATATION & CURRETTAGE/HYSTEROSCOPY WITH RESECTOCOPE N/A 10/17/2012   Procedure: DILATATION & CURETTAGE/HYSTEROSCOPY WITH RESECTOCOPE; Possible Polypectomy, Possible Resectoscopic Myomectomy.;  Surgeon: Floyce Stakes. Pamala Hurry, MD;  Location: [facility];  Service: Gynecology;  Laterality: N/A;  1 hr.  Marland Kitchen DILATION AND CURETTAGE OF UTERUS    . ENDOMETRIAL BIOPSY    . ENDOVENOUS ABLATION SAPHENOUS VEIN W/ LASER Right 05/08/2018   endovenous laser ablation right  greater saphenous vein and stab phlebectomy > 20 incisions right leg by Deitra Mayo MD   . ESOPHAGOGASTRODUODENOSCOPY ENDOSCOPY  2017   Normal  . HYSTEROSCOPY WITH D & C N/A 12/01/2013   Procedure: IUD Removal;  Surgeon: Floyce Stakes. Pamala Hurry, MD;  Location: [facility];  Service: Gynecology;  Laterality: N/A;  90 min.  . INTRAUTERINE DEVICE (IUD) INSERTION N/A 10/17/2012   Procedure: INTRAUTERINE DEVICE (IUD) INSERTION; Mirena;  Surgeon: Floyce Stakes. Pamala Hurry, MD;  Location: [facility];  Service: Gynecology;  Laterality: N/A;  . LAPAROSCOPIC GASTRIC SLEEVE RESECTION N/A 02/19/2017   Procedure: LAPAROSCOPIC GASTRIC SLEEVE RESECTION, UPPER ENDOSCOPY;  Surgeon: Johnathan Hausen, MD;  Location: [facility];  Service: General;  Laterality: N/A;  . LAPAROSCOPIC SALPINGO OOPHERECTOMY Left 10/29/2016   Procedure: LAPAROSCOPIC SALPINGO OOPHORECTOMY With anterior uterine biopsy;  Surgeon: Salvadore Dom, MD;  Location: [facility];  Service: Gynecology;  Laterality: Left;  . LASIK  10/06/2018  . MOUTH SURGERY     wisdom teeth    Current Outpatient Medications  Medication Sig Dispense Refill  . ascorbic acid (VITAMIN C) 500 MG tablet Take 500 mg by mouth daily.    . Biotin w/ Vitamins C & E (HAIR/SKIN/NAILS PO) Take by mouth.    . Chromium Picolinate 200 MCG CAPS Take 1 capsule by mouth daily.    . Coenzyme Q10 (COQ10) 100 MG CAPS Take 1 capsule by mouth daily.    . cyclobenzaprine (FLEXERIL) 10 MG tablet Take 1 tablet (10 mg total) by mouth 3 (three) times daily as needed for muscle spasms. 30 tablet 0  . diazepam (VALIUM) 5 MG tablet Take 1 tablet (5 mg total) by mouth every 8 (eight) hours as needed for anxiety. 60 tablet 0  . Docosahexaenoic Acid (DHA COMPLETE PO) Take 1 tablet by mouth daily.    . IRON PO Take 45 mg by mouth every other day.    . levonorgestrel (MIRENA) 20 MCG/24HR IUD 1 each by Intrauterine route once.    . loratadine (CLARITIN) 10 MG tablet Take 10 mg by mouth daily as needed for allergies.     . metFORMIN (GLUCOPHAGE) 500 MG tablet Take 1 tablet (500 mg total) by mouth every morning. 30 tablet 0  . Multiple Vitamins-Iron (MULTIVITAMINS WITH IRON) TABS tablet Take 1 tablet by mouth 2 (two) times daily. CHEWABLE    . nystatin cream (MYCOSTATIN) Apply 1 application topically 2 (two) times daily. Apply to affected area BID for up to 7 days. 30 g 1  . ondansetron (ZOFRAN) 8 MG tablet Take 1 tablet (8 mg total) by mouth every 8 (eight) hours as needed for  nausea or vomiting. 20 tablet 0  . OVER THE COUNTER MEDICATION Take 500 mg by mouth 3 (three) times daily. Bariatric calcium 500mg      . Probiotic Product (PROBIOTIC ADVANCED PO) Take by mouth.    . Spirulina 500 MG TABS Take 1 tablet by mouth daily.    . Vitamin D, Ergocalciferol, (DRISDOL) 1.25 MG (50000 UNIT) CAPS capsule Take 1 capsule (50,000 Units total) by mouth every 7 (seven) days. 4 capsule 0   No current facility-administered medications for this visit.     ALLERGIES: Iohexol and Pyridium [phenazopyridine hcl]  Family History  Problem Relation Age of Onset  . Diabetes Father   . Thyroid disease Father   . Hypertension Father   . Parkinson's disease Father   . Dementia Father   . Hyperlipidemia Father   . Heart disease Father   . Sleep apnea Father   . Obesity Father   . Diabetes Maternal Grandmother   . Heart disease Maternal Grandmother   . Diabetes Maternal Grandfather   . Heart disease Maternal Grandfather   . Stroke Maternal Grandfather   . Diabetes Paternal Grandmother   . Diabetes Paternal Grandfather   . Heart disease Paternal Grandfather   . Alcohol abuse Paternal Grandfather   . Hypertension Mother   . Diabetes Mother   . Depression Mother   . Obesity Mother   . Colon cancer Maternal Uncle     Social History   Socioeconomic History  . Marital status: Married    Spouse name: Demaya Janoff  . Number of children: 1  . Years of education: Not on file  . Highest education level: Not on file   Occupational History  . 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 Smoking status: Former Smoker    Packs/day: 0.25    Years: 19.00    Pack years: 4.75    Types: Cigarettes    Quit date: 06/06/2016    Years since quitting: 3.1  . Smokeless tobacco: Never Used  Substance and Sexual Activity  . Alcohol use: Yes    Comment: 1 a month  . Drug use: No  . Sexual activity: Yes    Partners: Male    Birth control/protection: I.U.D.  Other Topics Concern  . Not on file  Social History Narrative  . Not on file   Social Determinants of Health   Financial Resource Strain:   . Difficulty of Paying Living Expenses:   Food Insecurity:   . Worried About Charity fundraiser in the Last Year:   . Arboriculturist in the Last Year:   Transportation Needs:   . Film/video editor (Medical):   Marland Kitchen Lack of Transportation (Non-Medical):   Physical Activity:   . Days of Exercise per Week:   . Minutes of Exercise per Session:   Stress:   . Feeling of Stress :   Social Connections:   . Frequency of Communication with Friends and Family:   . Frequency of Social Gatherings with Friends and Family:   . Attends Religious Services:   . Active Member of Clubs or Organizations:   . Attends Archivist Meetings:   Marland Kitchen Marital Status:   Intimate Partner Violence:   . Fear of Current or Ex-Partner:   . Emotionally Abused:   Marland Kitchen Physically Abused:   . Sexually Abused:     Review of Systems  Constitutional: Negative.   HENT: Negative.   Eyes:  Negative.   Respiratory: Negative.   Cardiovascular: Negative.   Gastrointestinal: Negative.   Genitourinary:       Vaginal discharge Vaginal Burning Vaginal odor Pelvic pain  Musculoskeletal: Negative.   Skin: Negative.   Neurological: Negative.   Endo/Heme/Allergies: Negative.   Psychiatric/Behavioral: Negative.     PHYSICAL EXAMINATION:    BP 110/60 (BP Location: Right Arm, Patient Position:  Sitting, Cuff Size: Large)   Pulse 76   Temp 98.2 F (36.8 C) (Temporal)   Resp 10   Ht 5\' 8"  (1.727 m)   Wt 235 lb (106.6 kg)   LMP 07/30/2019   BMI 35.73 kg/m     General appearance: alert, cooperative and appears stated age Abdomen: soft, mildly tender BLQ; non distended, no masses,  no organomegaly  Pelvic: External genitalia:  no lesions              Urethra:  normal appearing urethra with no masses, tenderness or lesions              Bartholins and Skenes: normal                 Vagina: normal appearing vagina with normal color and discharge, no lesions              Cervix: no cervical motion tenderness, no lesions and IUD string 3 cm              Bimanual Exam:  Uterus:  anteverted, mobile, tender, not appreciably enlarged              Adnexa: no mass, fullness, tenderness                Chaperone was present for exam.  Wet prep: ++ clue, no trich, few wbc KOH: rare yeast PH: 5   ASSESSMENT Unprotected intercourse Pelvic pain BV and yeast      PLAN UPT: Negative STD testing Treat for endometritis Treat BV with Flagyl Diflucan for yeast F/U in 2 weeks, sooner if any worsening of her symptoms

## 2019-08-12 NOTE — Telephone Encounter (Signed)
Pt sent the following Mychart message:    Tawyna, Betters, MD 29 minutes ago (8:32 AM)   I think I may have BV or yeast infection, I have some burning and discharge.  Symptoms began yesterday and I am hoping I can be seen before this gets any worse.

## 2019-08-12 NOTE — Patient Instructions (Signed)
Vaginitis Vaginitis is a condition in which the vaginal tissue swells and becomes red (inflamed). This condition is most often caused by a change in the normal balance of bacteria and yeast that live in the vagina. This change causes an overgrowth of certain bacteria or yeast, which causes the inflammation. There are different types of vaginitis, but the most common types are:  Bacterial vaginosis.  Yeast infection (candidiasis).  Trichomoniasis vaginitis. This is a sexually transmitted disease (STD).  Viral vaginitis.  Atrophic vaginitis.  Allergic vaginitis. What are the causes? The cause of this condition depends on the type of vaginitis. It can be caused by:  Bacteria (bacterial vaginosis).  Yeast, which is a fungus (yeast infection).  A parasite (trichomoniasis vaginitis).  A virus (viral vaginitis).  Low hormone levels (atrophic vaginitis). Low hormone levels can occur during pregnancy, breastfeeding, or after menopause.  Irritants, such as bubble baths, scented tampons, and feminine sprays (allergic vaginitis). Other factors can change the normal balance of the yeast and bacteria that live in the vagina. These include:  Antibiotic medicines.  Poor hygiene.  Diaphragms, vaginal sponges, spermicides, birth control pills, and intrauterine devices (IUD).  Sex.  Infection.  Uncontrolled diabetes.  A weakened defense (immune) system. What increases the risk? This condition is more likely to develop in women who:  Smoke.  Use vaginal douches, scented tampons, or scented sanitary pads.  Wear tight-fitting pants.  Wear thong underwear.  Use oral birth control pills or an IUD.  Have sex without a condom.  Have multiple sex partners.  Have an STD.  Frequently use the spermicide nonoxynol-9.  Eat lots of foods high in sugar.  Have uncontrolled diabetes.  Have low estrogen levels.  Have a weakened immune system from an immune disorder or medical  treatment.  Are pregnant or breastfeeding. What are the signs or symptoms? Symptoms vary depending on the cause of the vaginitis. Common symptoms include:  Abnormal vaginal discharge. ? The discharge is white, gray, or yellow with bacterial vaginosis. ? The discharge is thick, white, and cheesy with a yeast infection. ? The discharge is frothy and yellow or greenish with trichomoniasis.  A bad vaginal smell. The smell is fishy with bacterial vaginosis.  Vaginal itching, pain, or swelling.  Sex that is painful.  Pain or burning when urinating. Sometimes there are no symptoms. How is this diagnosed? This condition is diagnosed based on your symptoms and medical history. A physical exam, including a pelvic exam, will also be done. You may also have other tests, including:  Tests to determine the pH level (acidity or alkalinity) of your vagina.  A whiff test, to assess the odor that results when a sample of your vaginal discharge is mixed with a potassium hydroxide solution.  Tests of vaginal fluid. A sample will be examined under a microscope. How is this treated? Treatment varies depending on the type of vaginitis you have. Your treatment may include:  Antibiotic creams or pills to treat bacterial vaginosis and trichomoniasis.  Antifungal medicines, such as vaginal creams or suppositories, to treat a yeast infection.  Medicine to ease discomfort if you have viral vaginitis. Your sexual partner should also be treated.  Estrogen delivered in a cream, pill, suppository, or vaginal ring to treat atrophic vaginitis. If vaginal dryness occurs, lubricants and moisturizing creams may help. You may need to avoid scented soaps, sprays, or douches.  Stopping use of a product that is causing allergic vaginitis. Then using a vaginal cream to treat the symptoms. Follow   these instructions at home: Lifestyle  Keep your genital area clean and dry. Avoid soap, and only rinse the area with  water.  Do not douche or use tampons until your health care provider says it is okay to do so. Use sanitary pads, if needed.  Do not have sex until your health care provider approves. When you can return to sex, practice safe sex and use condoms.  Wipe from front to back. This avoids the spread of bacteria from the rectum to the vagina. General instructions  Take over-the-counter and prescription medicines only as told by your health care provider.  If you were prescribed an antibiotic medicine, take or use it as told by your health care provider. Do not stop taking or using the antibiotic even if you start to feel better.  Keep all follow-up visits as told by your health care provider. This is important. How is this prevented?  Use mild, non-scented products. Do not use things that can irritate the vagina, such as fabric softeners. Avoid the following products if they are scented: ? Feminine sprays. ? Detergents. ? Tampons. ? Feminine hygiene products. ? Soaps or bubble baths.  Let air reach your genital area. ? Wear cotton underwear to reduce moisture buildup. ? Avoid wearing underwear while you sleep. ? Avoid wearing tight pants and underwear or nylons without a cotton panel. ? Avoid wearing thong underwear.  Take off any wet clothing, such as bathing suits, as soon as possible.  Practice safe sex and use condoms. Contact a health care provider if:  You have abdominal pain.  You have a fever.  You have symptoms that last for more than 2-3 days. Get help right away if:  You have a fever and your symptoms suddenly get worse. Summary  Vaginitis is a condition in which the vaginal tissue becomes inflamed.This condition is most often caused by a change in the normal balance of bacteria and yeast that live in the vagina.  Treatment varies depending on the type of vaginitis you have.  Do not douche, use tampons , or have sex until your health care provider approves. When  you can return to sex, practice safe sex and use condoms. This information is not intended to replace advice given to you by your health care provider. Make sure you discuss any questions you have with your health care provider. Document Revised: 04/05/2017 Document Reviewed: 05/29/2016 Elsevier Patient Education  2020 Elsevier Inc.  

## 2019-08-13 LAB — HEP, RPR, HIV PANEL
HIV Screen 4th Generation wRfx: NONREACTIVE
Hepatitis B Surface Ag: NEGATIVE
RPR Ser Ql: NONREACTIVE

## 2019-08-13 LAB — HEPATITIS C ANTIBODY: Hep C Virus Ab: <0.1 {s_co_ratio} (ref 0.0–0.9)

## 2019-08-14 ENCOUNTER — Encounter: Payer: Self-pay | Admitting: Obstetrics and Gynecology

## 2019-08-14 LAB — CHLAMYDIA/GONOCOCCUS/TRICHOMONAS, NAA
Chlamydia by NAA: NEGATIVE
Gonococcus by NAA: NEGATIVE
Trich vag by NAA: POSITIVE — AB

## 2019-08-17 ENCOUNTER — Telehealth: Payer: Self-pay

## 2019-08-17 NOTE — Telephone Encounter (Signed)
Spoke with pt. Pt given reviewed results. Pt gave information for EPT and will pick up in office on 4/13. Pt also made 1 month follow up appt on 09/09/2019 at 3:30 pm with Dr Talbert Nan for TOC. Pt agreeable and verbalized understanding. See lab results dated 4/7.   Routing to Dr Talbert Nan for review.  Encounter closed.

## 2019-08-25 ENCOUNTER — Other Ambulatory Visit: Payer: Self-pay

## 2019-08-25 ENCOUNTER — Encounter (INDEPENDENT_AMBULATORY_CARE_PROVIDER_SITE_OTHER): Payer: Self-pay | Admitting: Family Medicine

## 2019-08-25 ENCOUNTER — Ambulatory Visit (INDEPENDENT_AMBULATORY_CARE_PROVIDER_SITE_OTHER): Payer: No Typology Code available for payment source | Admitting: Family Medicine

## 2019-08-25 VITALS — BP 109/71 | HR 69 | Temp 98.3°F | Ht 68.0 in | Wt 232.0 lb

## 2019-08-25 DIAGNOSIS — Z6835 Body mass index (BMI) 35.0-35.9, adult: Secondary | ICD-10-CM | POA: Diagnosis not present

## 2019-08-25 DIAGNOSIS — E559 Vitamin D deficiency, unspecified: Secondary | ICD-10-CM | POA: Diagnosis not present

## 2019-08-25 DIAGNOSIS — E1169 Type 2 diabetes mellitus with other specified complication: Secondary | ICD-10-CM | POA: Diagnosis not present

## 2019-08-25 DIAGNOSIS — E119 Type 2 diabetes mellitus without complications: Secondary | ICD-10-CM

## 2019-08-25 MED ORDER — VITAMIN D (ERGOCALCIFEROL) 1.25 MG (50000 UNIT) PO CAPS: 50000.0000 [IU] | ORAL_CAPSULE | ORAL | 0 refills | Status: DC

## 2019-08-25 MED ORDER — METFORMIN HCL 500 MG PO TABS: 500.0000 mg | ORAL_TABLET | Freq: Every morning | ORAL | 0 refills | Status: DC

## 2019-08-26 ENCOUNTER — Encounter (INDEPENDENT_AMBULATORY_CARE_PROVIDER_SITE_OTHER): Payer: Self-pay | Admitting: Family Medicine

## 2019-08-26 ENCOUNTER — Ambulatory Visit: Payer: Self-pay | Admitting: Obstetrics and Gynecology

## 2019-08-26 NOTE — Progress Notes (Signed)
Chief Complaint:   OBESITY [name] [name] is here to discuss her progress with her obesity treatment plan along with follow-up of her obesity related diagnoses. [name] [name] is on the Category 2 Plan and states she is following her eating plan approximately 25% of the time. [name] [name] states she is doing cardio and resistance training for 45 minutes 3 times per week.  Today's visit was #: 5 Starting weight: 243 lbs Starting date: 06/18/2019 Today's weight: 232 lbs Today's date: 08/25/2019 Total lbs lost to date: 11 Total lbs lost since last in-office visit: 2  Interim History: [name] [name] notes being off the plan due to stress. She is skipping some meals. She is going through a marital separation. She still likes the structure of the Category 2 plan. She does tend to avoid the bread on the plan.  Subjective:   1. Type 2 diabetes mellitus without complication, without long-term current use of insulin (HCC) [name] [name]'s diabetes mellitus is well controlled. DM is mostly in remission after gastric sleeve in 2018. Lab Results  Component Value Date   HGBA1C 5.7 (H) 06/18/2019   HGBA1C 5.3 01/08/2019   HGBA1C 5.5 05/30/2017   Lab Results  Component Value Date   MICROALBUR 30 01/09/2017   LDLCALC 129 (H) 06/18/2019   CREATININE 0.84 07/20/2019     2. Vitamin D deficiency [name] [name]'s last Vit D level was low at 33. She is on high dose Vit D weekly.  Assessment/Plan:   1. Type 2 diabetes mellitus without complication, without long-term current use of insulin ([place]) . We will refill metformin for 1 month, [name] [name] may take 1/2 pill.  - metFORMIN (GLUCOPHAGE) 500 MG tablet; Take 1 tablet (500 mg total) by mouth every morning.  Dispense: 30 tablet; Refill: 0  2. Vitamin D deficiency Low Vitamin D level contributes to fatigue and are associated with obesity, breast, and colon cancer. We will refill prescription Vitamin D for 1 month. [name] [name] will follow-up for routine testing of Vitamin D, at least 2-3  times per year to avoid over-replacement.  - Vitamin D, Ergocalciferol, (DRISDOL) 1.25 MG (50000 UNIT) CAPS capsule; Take 1 capsule (50,000 Units total) by mouth every 7 (seven) days.  Dispense: 4 capsule; Refill: 0  3. Class 2 severe obesity with serious comorbidity and body mass index (BMI) of 35.0 to 35.9 in adult, unspecified obesity type (HCC) [name] [name] is currently in the action stage of change. As such, her goal is to continue with weight loss efforts. She has agreed to the Category 2 Plan.   [name] [name] may decrease her bread to 1 slice at lunch if she would like.   Exercise goals: As is or may increase exercise if desired..  Behavioral modification strategies: increasing lean protein intake and no skipping meals.  [name] [name] has agreed to follow-up with our clinic in 2 to 3 weeks. She was informed of the importance of frequent follow-up visits to maximize her success with intensive lifestyle modifications for her multiple health conditions.   Objective:   Blood pressure 109/71, pulse 69, temperature 98.3 F (36.8 C), temperature source Oral, height 5\' 8"  (1.727 m), weight 232 lb (105.2 kg), last menstrual period 07/30/2019, SpO2 99 %. Body mass index is 35.28 kg/m.  General: Cooperative, alert, well developed, in no acute distress. HEENT: Conjunctivae and lids unremarkable. Cardiovascular: Regular rhythm.  Lungs: Normal work of breathing. Neurologic: No focal deficits.   Lab Results  Component Value Date   CREATININE 0.84 07/20/2019   BUN 13 07/20/2019   NA 145 (  H) 07/20/2019   K 4.1 07/20/2019   CL 108 (H) 07/20/2019   CO2 21 07/20/2019   Lab Results  Component Value Date   ALT 10 07/20/2019   AST 13 07/20/2019   ALKPHOS 74 07/20/2019   BILITOT 0.4 07/20/2019   Lab Results  Component Value Date   HGBA1C 5.7 (H) 06/18/2019   HGBA1C 5.3 01/08/2019   HGBA1C 5.5 05/30/2017   HGBA1C 6.4 01/09/2017   HGBA1C 7.2 (H) 10/10/2016   Lab Results  Component Value Date    INSULIN 10.1 06/18/2019   Lab Results  Component Value Date   TSH 0.702 06/18/2019   Lab Results  Component Value Date   CHOL 204 (H) 06/18/2019   HDL 58 06/18/2019   LDLCALC 129 (H) 06/18/2019   TRIG 93 06/18/2019   CHOLHDL 3.7 01/08/2019   Lab Results  Component Value Date   WBC 9.4 06/18/2019   HGB 14.9 06/18/2019   HCT 44.1 06/18/2019   MCV 88 06/18/2019   PLT 301 06/18/2019   Lab Results  Component Value Date   FERRITIN 9 (L) 03/14/2016   Attestation Statements:   Reviewed by clinician on day of visit: allergies, medications, problem list, medical history, surgical history, family history, social history, and previous encounter notes.   Wilhemena Durie, am acting as Location manager for [employer], FNP-C.  I have reviewed the above documentation for accuracy and completeness, and I agree with the above. -  Georgianne Fick, FNP

## 2019-08-27 MED FILL — VIT D2 1.25 MG (50,000 UNIT: 1.25 MG | 28 days supply | Qty: 4 | Fill #0

## 2019-08-29 MED FILL — METFORMIN HCL 500 MG TABS: 500 | 30 days supply | Qty: 30 | Fill #0

## 2019-09-08 ENCOUNTER — Other Ambulatory Visit: Payer: Self-pay

## 2019-09-09 ENCOUNTER — Ambulatory Visit (INDEPENDENT_AMBULATORY_CARE_PROVIDER_SITE_OTHER): Payer: No Typology Code available for payment source | Admitting: Obstetrics and Gynecology

## 2019-09-09 ENCOUNTER — Encounter: Payer: Self-pay | Admitting: Obstetrics and Gynecology

## 2019-09-09 VITALS — BP 128/76 | HR 74 | Temp 98.4°F | Ht 68.0 in | Wt 241.0 lb

## 2019-09-09 DIAGNOSIS — Z8619 Personal history of other infectious and parasitic diseases: Secondary | ICD-10-CM

## 2019-09-09 DIAGNOSIS — Z113 Encounter for screening for infections with a predominantly sexual mode of transmission: Secondary | ICD-10-CM | POA: Diagnosis not present

## 2019-09-09 DIAGNOSIS — R3915 Urgency of urination: Secondary | ICD-10-CM

## 2019-09-09 DIAGNOSIS — R8290 Unspecified abnormal findings in urine: Secondary | ICD-10-CM

## 2019-09-09 DIAGNOSIS — N3941 Urge incontinence: Secondary | ICD-10-CM

## 2019-09-09 LAB — POCT URINALYSIS DIPSTICK
Bilirubin, UA: NEGATIVE
Blood, UA: NEGATIVE
Glucose, UA: NEGATIVE
Ketones, UA: NEGATIVE
Leukocytes, UA: NEGATIVE
Nitrite, UA: NEGATIVE
Protein, UA: NEGATIVE
Urobilinogen, UA: NEGATIVE E.U./dL — AB
pH, UA: 6 (ref 5.0–8.0)

## 2019-09-09 NOTE — Progress Notes (Signed)
GYNECOLOGY  VISIT   HPI: 39 y.o.   Married White or Caucasian Not Hispanic or Latino  female   [phone] with No LMP recorded. (Menstrual status: IUD).   here for  Test of cure, last month she was treated for trich. She had negative GC/CT and negative blood work for [employer]. She is experiencing urgency of urination, with an odor, feels she isn't emptying all the way. Symptoms started 3 days ago, no frequency. She had some urge incontinence last night. Cutting back on caffeine, drinking lots of water.  Sexually active again, with her normal partner. He wasn't exposed to the trich. Using condoms.  She wants repeat cervical std testing  GYNECOLOGIC HISTORY: No LMP recorded. (Menstrual status: IUD). Contraception:IUD, mirena placed in 12/17 Menopausal hormone therapy: none         OB History    Gravida  3   Para  1   Term  1   Preterm      AB  2   Living  1     SAB  2   TAB      Ectopic      Multiple      Live Births  1              Patient Active Problem List   Diagnosis Date Noted  . Vitamin D deficiency 08/06/2019  . Constipation 08/06/2019  . Endometriosis   . Candidiasis, cutaneous 07/14/2018  . Flu-like symptoms 07/09/2018  . Pharyngitis 07/09/2018  . GAD (generalized anxiety disorder) 06/02/2018  . Dysuria 03/27/2018  . Family history of Parkinsonism 03/27/2018  . Dizziness 03/27/2018  . Bradycardia 03/23/2018  . HLD (hyperlipidemia) 03/23/2018  . Coronary atherosclerosis 03/23/2018  . Chest pain 03/22/2018  . Varicose vein of leg 12/24/2017  . Venous reflux 12/24/2017  . Leukocytosis 09/29/2017  . Nausea 09/29/2017  . Anxiety 09/29/2017  . Pancreatitis 09/28/2017  . Dehydration 03/12/2017  . 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 Sleep apnea 01/09/2017  . Healthcare maintenance 10/18/2016  . Type 2 diabetes mellitus without complication, without long-term current use of insulin ([place]) 10/18/2016  . LOW BACK PAIN, ACUTE  11/20/2006  . POLYCYSTIC OVARY 07/04/2006  . OBESITY, NOS 07/04/2006  . TOBACCO USE, QUIT 07/04/2006    Past Medical History:  Diagnosis Date  . Abnormal uterine bleeding   . Amenorrhea   . Anemia   . Anxiety    panic attacks  . Back pain   . Bradycardia   . Chest pain   . Constipation   . Diabetes ([place])   . Dysmenorrhea   . Endometriosis   . Gallbladder problem   . GERD (gastroesophageal reflux disease)    occasional - diet controlled and tums prn  . HLD (hyperlipidemia)   . HTN (hypertension)   . IBS (irritable bowel syndrome)   . Infertility, female   . Insulin resistance   . Migraine without aura   . Missed abortion    x 2 - both resolved without surgery  . Obesity   . PCOS (polycystic ovarian syndrome)   . PCOS (polycystic ovarian syndrome)   . PONV (postoperative nausea and vomiting)    pe rpatient ; scopalamine patch very helpful   . Prediabetes   . Sleep apnea   . SVD (spontaneous vaginal delivery)    x 1  . Urinary incontinence   . Vitamin D deficiency     Past Surgical History:  Procedure Laterality Date  . CHOLECYSTECTOMY    .  COLONOSCOPY  2017   polyps  . CYSTOSCOPY N/A 10/29/2016   Procedure: CYSTOSCOPY;  Surgeon: Salvadore Dom, MD;  Location: [facility];  Service: Gynecology;  Laterality: N/A;  . DILATATION & CURRETTAGE/HYSTEROSCOPY WITH RESECTOCOPE N/A 10/17/2012   Procedure: DILATATION & CURETTAGE/HYSTEROSCOPY WITH RESECTOCOPE; Possible Polypectomy, Possible Resectoscopic Myomectomy.;  Surgeon: Floyce Stakes. Pamala Hurry, MD;  Location: [facility];  Service: Gynecology;  Laterality: N/A;  1 hr.  Marland Kitchen DILATION AND CURETTAGE OF UTERUS    . ENDOMETRIAL BIOPSY    . ENDOVENOUS ABLATION SAPHENOUS VEIN W/ LASER Right 05/08/2018   endovenous laser ablation right greater saphenous vein and stab phlebectomy > 20 incisions right leg by Deitra Mayo MD   . ESOPHAGOGASTRODUODENOSCOPY ENDOSCOPY  2017   Normal  . HYSTEROSCOPY WITH D & C N/A 12/01/2013   Procedure:  IUD Removal;  Surgeon: Floyce Stakes. Pamala Hurry, MD;  Location: [facility];  Service: Gynecology;  Laterality: N/A;  90 min.  . INTRAUTERINE DEVICE (IUD) INSERTION N/A 10/17/2012   Procedure: INTRAUTERINE DEVICE (IUD) INSERTION; Mirena;  Surgeon: Floyce Stakes. Pamala Hurry, MD;  Location: [facility];  Service: Gynecology;  Laterality: N/A;  . LAPAROSCOPIC GASTRIC SLEEVE RESECTION N/A 02/19/2017   Procedure: LAPAROSCOPIC GASTRIC SLEEVE RESECTION, UPPER ENDOSCOPY;  Surgeon: Johnathan Hausen, MD;  Location: [facility];  Service: General;  Laterality: N/A;  . LAPAROSCOPIC SALPINGO OOPHERECTOMY Left 10/29/2016   Procedure: LAPAROSCOPIC SALPINGO OOPHORECTOMY With anterior uterine biopsy;  Surgeon: Salvadore Dom, MD;  Location: [facility];  Service: Gynecology;  Laterality: Left;  . LASIK  10/06/2018  . MOUTH SURGERY     wisdom teeth    Current Outpatient Medications  Medication Sig Dispense Refill  . ascorbic acid (VITAMIN C) 500 MG tablet Take 500 mg by mouth daily.    . Biotin w/ Vitamins C & E (HAIR/SKIN/NAILS PO) Take by mouth.    . Chromium Picolinate 200 MCG CAPS Take 1 capsule by mouth daily.    . Coenzyme Q10 (COQ10) 100 MG CAPS Take 1 capsule by mouth daily.    . cyclobenzaprine (FLEXERIL) 10 MG tablet Take 1 tablet (10 mg total) by mouth 3 (three) times daily as needed for muscle spasms. 30 tablet 0  . diazepam (VALIUM) 5 MG tablet Take 1 tablet (5 mg total) by mouth every 8 (eight) hours as needed for anxiety. 60 tablet 0  . Docosahexaenoic Acid (DHA COMPLETE PO) Take 1 tablet by mouth daily.    Marland Kitchen doxycycline (VIBRAMYCIN) 100 MG capsule Take 1 capsule (100 mg total) by mouth 2 (two) times daily. Take BID for 10 days.  Take with food as can cause GI distress. 20 capsule 0  . IRON PO Take 45 mg by mouth every other day.    . levonorgestrel (MIRENA) 20 MCG/24HR IUD 1 each by Intrauterine route once.    . loratadine (CLARITIN) 10 MG tablet Take 10 mg by mouth daily as needed for allergies.    . metFORMIN (GLUCOPHAGE)  500 MG tablet Take 1 tablet (500 mg total) by mouth every morning. 30 tablet 0  . metroNIDAZOLE (FLAGYL) 500 MG tablet Take 1 tablet (500 mg total) by mouth 2 (two) times daily. 14 tablet 0  . Multiple Vitamins-Iron (MULTIVITAMINS WITH IRON) TABS tablet Take 1 tablet by mouth 2 (two) times daily. CHEWABLE    . nystatin cream (MYCOSTATIN) Apply 1 application topically 2 (two) times daily. Apply to affected area BID for up to 7 days. 30 g 1  . ondansetron (ZOFRAN) 8 MG tablet Take 1 tablet (  8 mg total) by mouth every 8 (eight) hours as needed for nausea or vomiting. 20 tablet 0  . OVER THE COUNTER MEDICATION Take 500 mg by mouth 3 (three) times daily. Bariatric calcium 500mg      . Probiotic Product (PROBIOTIC ADVANCED PO) Take by mouth.    . Spirulina 500 MG TABS Take 1 tablet by mouth daily.    . Vitamin D, Ergocalciferol, (DRISDOL) 1.25 MG (50000 UNIT) CAPS capsule Take 1 capsule (50,000 Units total) by mouth every 7 (seven) days. 4 capsule 0   No current facility-administered medications for this visit.     ALLERGIES: Iohexol and Pyridium [phenazopyridine hcl]  Family History  Problem Relation Age of Onset  . Diabetes Father   . Thyroid disease Father   . Hypertension Father   . Parkinson's disease Father   . Dementia Father   . Hyperlipidemia Father   . Heart disease Father   . Sleep apnea Father   . Obesity Father   . Diabetes Maternal Grandmother   . Heart disease Maternal Grandmother   . Diabetes Maternal Grandfather   . Heart disease Maternal Grandfather   . Stroke Maternal Grandfather   . Diabetes Paternal Grandmother   . Diabetes Paternal Grandfather   . Heart disease Paternal Grandfather   . Alcohol abuse Paternal Grandfather   . Hypertension Mother   . Diabetes Mother   . Depression Mother   . Obesity Mother   . Colon cancer Maternal Uncle     Social History   Socioeconomic History  . Marital status: Married    Spouse name: Shaely Proper  . Number of children: 1   . Years of education: Not on file  . Highest education level: Not on file  Occupational History  . 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 Smoking status: Former Smoker    Packs/day: 0.25    Years: 19.00    Pack years: 4.75    Types: Cigarettes    Quit date: 06/06/2016    Years since quitting: 3.2  . Smokeless tobacco: Never Used  Substance and Sexual Activity  . Alcohol use: Yes    Comment: 1 a month  . Drug use: No  . Sexual activity: Yes    Partners: Male    Birth control/protection: I.U.D.  Other Topics Concern  . Not on file  Social History Narrative  . Not on file   Social Determinants of Health   Financial Resource Strain:   . Difficulty of Paying Living Expenses:   Food Insecurity:   . Worried About Charity fundraiser in the Last Year:   . Arboriculturist in the Last Year:   Transportation Needs:   . Film/video editor (Medical):   Marland Kitchen Lack of Transportation (Non-Medical):   Physical Activity:   . Days of Exercise per Week:   . Minutes of Exercise per Session:   Stress:   . Feeling of Stress :   Social Connections:   . Frequency of Communication with Friends and Family:   . Frequency of Social Gatherings with Friends and Family:   . Attends Religious Services:   . Active Member of Clubs or Organizations:   . Attends Archivist Meetings:   Marland Kitchen Marital Status:   Intimate Partner Violence:   . Fear of Current or Ex-Partner:   . Emotionally Abused:   Marland Kitchen Physically Abused:   . Sexually Abused:     Review  of Systems  Genitourinary: Positive for urgency.    PHYSICAL EXAMINATION:    BP 128/76   Pulse 74   Temp 98.4 F (36.9 C)   Ht 5\' 8"  (1.727 m)   Wt 241 lb (109.3 kg)   SpO2 99%   BMI 36.64 kg/m     General appearance: alert, cooperative and appears stated age Abdomen: soft, non-tender; non distended, no masses,  no organomegaly CVA: not tender  Pelvic: External genitalia:  no  lesions              Urethra:  normal appearing urethra with no masses, tenderness or lesions              Bartholins and Skenes: normal                 Vagina: normal appearing vagina with normal color and discharge, no lesions              Cervix: no cervical motion tenderness, no lesions and IUD string 3-4 cm              Bimanual Exam:  Uterus:  normal size, contour, position, consistency, mobility, non-tender and anteverted              Adnexa: no mass, fullness, tenderness              Bladder: not tender, not full  Chaperone was present for exam.  ASSESSMENT H/O trich, s/p treatment H/O urinary urgency, odor. New urge incontinence x 1. No frequency or pain. Urine dip is normal.    PLAN Send nuswab for STD testing, declines the blood work Urine for ua, c&s Hydrate well, call with worsening or persistent symptoms

## 2019-09-10 LAB — URINALYSIS, MICROSCOPIC ONLY
Bacteria, UA: NONE SEEN
Casts: NONE SEEN /lpf
RBC, Urine: NONE SEEN /hpf (ref 0–2)

## 2019-09-11 ENCOUNTER — Other Ambulatory Visit: Payer: Self-pay | Admitting: Obstetrics and Gynecology

## 2019-09-11 ENCOUNTER — Encounter (HOSPITAL_COMMUNITY): Payer: Self-pay

## 2019-09-11 LAB — CHLAMYDIA/GONOCOCCUS/TRICHOMONAS, NAA
Chlamydia by NAA: NEGATIVE
Gonococcus by NAA: NEGATIVE
Trich vag by NAA: NEGATIVE

## 2019-09-11 LAB — URINE CULTURE

## 2019-09-11 MED ORDER — SULFAMETHOXAZOLE-TRIMETHOPRIM 800-160 MG PO TABS: 1.0000 | ORAL_TABLET | Freq: Two times a day (BID) | ORAL | 0 refills | Status: DC

## 2019-09-11 MED FILL — SULFAMETHOXAZOLE-TMP DS TAB: 800-160 | 3 days supply | Qty: 6 | Fill #0

## 2019-09-15 ENCOUNTER — Ambulatory Visit (INDEPENDENT_AMBULATORY_CARE_PROVIDER_SITE_OTHER): Payer: No Typology Code available for payment source | Admitting: Family Medicine

## 2019-09-21 ENCOUNTER — Ambulatory Visit (INDEPENDENT_AMBULATORY_CARE_PROVIDER_SITE_OTHER): Payer: No Typology Code available for payment source | Admitting: Family Medicine

## 2019-09-21 ENCOUNTER — Encounter (INDEPENDENT_AMBULATORY_CARE_PROVIDER_SITE_OTHER): Payer: Self-pay | Admitting: Family Medicine

## 2019-09-21 ENCOUNTER — Other Ambulatory Visit: Payer: Self-pay

## 2019-09-21 VITALS — BP 112/71 | HR 58 | Temp 98.0°F | Ht 68.0 in | Wt 232.0 lb

## 2019-09-21 DIAGNOSIS — E559 Vitamin D deficiency, unspecified: Secondary | ICD-10-CM | POA: Diagnosis not present

## 2019-09-21 DIAGNOSIS — E119 Type 2 diabetes mellitus without complications: Secondary | ICD-10-CM

## 2019-09-21 DIAGNOSIS — Z6835 Body mass index (BMI) 35.0-35.9, adult: Secondary | ICD-10-CM | POA: Diagnosis not present

## 2019-09-21 MED ORDER — METFORMIN HCL 500 MG PO TABS: 500.0000 mg | ORAL_TABLET | Freq: Every morning | ORAL | 0 refills | Status: DC

## 2019-09-21 MED ORDER — VITAMIN D (ERGOCALCIFEROL) 1.25 MG (50000 UNIT) PO CAPS: 50000.0000 [IU] | ORAL_CAPSULE | ORAL | 0 refills | Status: DC

## 2019-09-21 MED FILL — METFORMIN HCL 500 MG TABS: 500 | 30 days supply | Qty: 30 | Fill #0

## 2019-09-21 MED FILL — VIT D2 1.25 MG (50,000 UNIT: 1.25 MG | 28 days supply | Qty: 4 | Fill #0

## 2019-09-22 ENCOUNTER — Encounter (INDEPENDENT_AMBULATORY_CARE_PROVIDER_SITE_OTHER): Payer: Self-pay | Admitting: Family Medicine

## 2019-09-22 NOTE — Progress Notes (Signed)
Chief Complaint:   OBESITY [name] [name] is here to discuss her progress with her obesity treatment plan along with follow-up of her obesity related diagnoses. [name] [name] is on the Category 2 Plan and states she is following her eating plan approximately 30% of the time. [name] [name] states she is doing cardio and resistance for 45 minutes 3 times per week.  Today's visit was #: 6 Starting weight: 243 lbs Starting date: 06/18/2019 Today's weight: 232 lbs Today's date: 09/21/2019 Total lbs lost to date: 11 Total lbs lost since last in-office visit: 0  Interim History: [name] [name] notes a lot of personal stressors. She admits to indulging quite a bit of on simple carbohydrates and she is surprised she has not gained weight. She maintained today. She is not bored with the food on cat 2.  Subjective:   1. Vitamin D deficiency [name] [name]'s last Vit D level was low at 33.3. She is on high dose Vit D.  2. Type 2 diabetes mellitus without complication, without long-term current use of insulin (HCC) [name] [name] has been in remission mostly since her gastric sleeve in 2018. She notes that her A1cs crept back up with weight gain after surgery. Last A1c was 5.7. Lab Results  Component Value Date   HGBA1C 5.7 (H) 06/18/2019   HGBA1C 5.3 01/08/2019   HGBA1C 5.5 05/30/2017   Lab Results  Component Value Date   MICROALBUR 30 01/09/2017   LDLCALC 129 (H) 06/18/2019   CREATININE 0.84 07/20/2019     Assessment/Plan:   1. Vitamin D deficiency Low Vitamin D level contributes to fatigue and are associated with obesity, breast, and colon cancer. We will refill prescription Vitamin D for 1 month. [name] [name] will follow-up for routine testing of Vitamin D, at least 2-3 times per year to avoid over-replacement.  - Vitamin D, Ergocalciferol, (DRISDOL) 1.25 MG (50000 UNIT) CAPS capsule; Take 1 capsule (50,000 Units total) by mouth every 7 (seven) days.  Dispense: 4 capsule; Refill: 0  2. Type 2 diabetes mellitus without  complication, without long-term current use of insulin (HCC) Good blood sugar control is important to decrease the likelihood of diabetic complications such as nephropathy, neuropathy, limb loss, blindness, coronary artery disease, and death. Intensive lifestyle modification including diet, exercise and weight loss are the first line of treatment for diabetes. We will recheck See's labs at her next office visit, and we will refill metformin for 1 month.  - metFORMIN (GLUCOPHAGE) 500 MG tablet; Take 1 tablet (500 mg total) by mouth every morning.  Dispense: 30 tablet; Refill: 0  3. Class 2 severe obesity with serious comorbidity and body mass index (BMI) of 35.0 to 35.9 in adult, unspecified obesity type (HCC) [name] [name] is currently in the action stage of change. As such, her goal is to continue with weight loss efforts. She has agreed to the Category 2 Plan and keeping a food journal and adhering to recommended goals of 200-300 calories and 20 grams of protein at breakfast daily.   Exercise goals: As is.  Behavioral modification strategies: decreasing simple carbohydrates.  [name] [name] has agreed to follow-up with our clinic in 3 weeks. She was informed of the importance of frequent follow-up visits to maximize her success with intensive lifestyle modifications for her multiple health conditions.   Objective:   Blood pressure 112/71, pulse (!) 58, temperature 98 F (36.7 C), temperature source Oral, height 5\' 8"  (1.727 m), weight 232 lb (105.2 kg), SpO2 100 %. Body mass index is 35.28 kg/m.  General: Cooperative, alert,  well developed, in no acute distress. HEENT: Conjunctivae and lids unremarkable. Cardiovascular: Regular rhythm.  Lungs: Normal work of breathing. Neurologic: No focal deficits.   Lab Results  Component Value Date   CREATININE 0.84 07/20/2019   BUN 13 07/20/2019   NA 145 (H) 07/20/2019   K 4.1 07/20/2019   CL 108 (H) 07/20/2019   CO2 21 07/20/2019   Lab Results   Component Value Date   ALT 10 07/20/2019   AST 13 07/20/2019   ALKPHOS 74 07/20/2019   BILITOT 0.4 07/20/2019   Lab Results  Component Value Date   HGBA1C 5.7 (H) 06/18/2019   HGBA1C 5.3 01/08/2019   HGBA1C 5.5 05/30/2017   HGBA1C 6.4 01/09/2017   HGBA1C 7.2 (H) 10/10/2016   Lab Results  Component Value Date   INSULIN 10.1 06/18/2019   Lab Results  Component Value Date   TSH 0.702 06/18/2019   Lab Results  Component Value Date   CHOL 204 (H) 06/18/2019   HDL 58 06/18/2019   LDLCALC 129 (H) 06/18/2019   TRIG 93 06/18/2019   CHOLHDL 3.7 01/08/2019   Lab Results  Component Value Date   WBC 9.4 06/18/2019   HGB 14.9 06/18/2019   HCT 44.1 06/18/2019   MCV 88 06/18/2019   PLT 301 06/18/2019   Lab Results  Component Value Date   FERRITIN 9 (L) 03/14/2016   Attestation Statements:   Reviewed by clinician on day of visit: allergies, medications, problem list, medical history, surgical history, family history, social history, and previous encounter notes.   Wilhemena Durie, am acting as Location manager for [employer], FNP-C.  I have reviewed the above documentation for accuracy and completeness, and I agree with the above. -  Georgianne Fick, FNP

## 2019-09-30 ENCOUNTER — Emergency Department (HOSPITAL_COMMUNITY): Payer: No Typology Code available for payment source

## 2019-09-30 ENCOUNTER — Encounter (HOSPITAL_COMMUNITY): Payer: Self-pay

## 2019-09-30 ENCOUNTER — Other Ambulatory Visit: Payer: Self-pay

## 2019-09-30 ENCOUNTER — Emergency Department (HOSPITAL_COMMUNITY)
Admission: EM | Admit: 2019-09-30 | Discharge: 2019-09-30 | Disposition: A | Discharge status: Home / Self Care | Payer: No Typology Code available for payment source | Attending: Emergency Medicine | Admitting: Emergency Medicine

## 2019-09-30 DIAGNOSIS — Z7984 Long term (current) use of oral hypoglycemic drugs: Secondary | ICD-10-CM | POA: Diagnosis not present

## 2019-09-30 DIAGNOSIS — R55 Syncope and collapse: Secondary | ICD-10-CM | POA: Insufficient documentation

## 2019-09-30 DIAGNOSIS — R42 Dizziness and giddiness: Secondary | ICD-10-CM | POA: Insufficient documentation

## 2019-09-30 DIAGNOSIS — E119 Type 2 diabetes mellitus without complications: Secondary | ICD-10-CM | POA: Insufficient documentation

## 2019-09-30 DIAGNOSIS — R001 Bradycardia, unspecified: Secondary | ICD-10-CM | POA: Insufficient documentation

## 2019-09-30 DIAGNOSIS — I2510 Atherosclerotic heart disease of native coronary artery without angina pectoris: Secondary | ICD-10-CM | POA: Insufficient documentation

## 2019-09-30 DIAGNOSIS — Z87891 Personal history of nicotine dependence: Secondary | ICD-10-CM | POA: Diagnosis not present

## 2019-09-30 DIAGNOSIS — R079 Chest pain, unspecified: Secondary | ICD-10-CM | POA: Diagnosis present

## 2019-09-30 LAB — BASIC METABOLIC PANEL
Anion gap: 8 (ref 5–15)
BUN: 17 mg/dL (ref 6–20)
CO2: 24 mmol/L (ref 22–32)
Calcium: 9 mg/dL (ref 8.9–10.3)
Chloride: 106 mmol/L (ref 98–111)
Creatinine, Ser: 1.05 mg/dL — ABNORMAL HIGH (ref 0.44–1.00)
GFR calc Af Amer: >60 mL/min (ref >60)
GFR calc non Af Amer: >60 mL/min (ref >60)
Glucose, Bld: 90 mg/dL (ref 70–99)
Potassium: 4 mmol/L (ref 3.5–5.1)
Sodium: 138 mmol/L (ref 135–145)

## 2019-09-30 LAB — CBC
HCT: 39.8 % (ref 36.0–46.0)
Hemoglobin: 13.4 g/dL (ref 12.0–15.0)
MCH: 30 pg (ref 26.0–34.0)
MCHC: 33.7 g/dL (ref 30.0–36.0)
MCV: 89.2 fL (ref 80.0–100.0)
Platelets: 268 10*3/uL (ref 150–400)
RBC: 4.46 MIL/uL (ref 3.87–5.11)
RDW: 11.9 % (ref 11.5–15.5)
WBC: 6.9 10*3/uL (ref 4.0–10.5)
nRBC: 0 % (ref 0.0–0.2)

## 2019-09-30 LAB — I-STAT BETA HCG BLOOD, ED (MC, WL, AP ONLY): I-stat hCG, quantitative: <5 m[IU]/mL (ref <5)

## 2019-09-30 LAB — TSH: TSH: 0.764 u[IU]/mL (ref 0.350–4.500)

## 2019-09-30 LAB — TROPONIN I (HIGH SENSITIVITY)
Troponin I (High Sensitivity): <2 ng/L (ref <18)
Troponin I (High Sensitivity): <2 ng/L (ref <18)

## 2019-09-30 MED ORDER — ONDANSETRON 4 MG PO TBDP
4.0000 mg | ORAL_TABLET | Freq: Once | ORAL | Status: AC | PRN
  Administered 2019-09-30: 4 mg via ORAL
  Filled 2019-09-30: qty 1

## 2019-09-30 NOTE — ED Notes (Signed)
Pt ambulated without difficulty at 100% 02

## 2019-09-30 NOTE — ED Triage Notes (Signed)
Pt c.o chest pain and nausea that started last night.

## 2019-09-30 NOTE — Discharge Instructions (Addendum)
We saw in the ER for dizziness and chest discomfort.  No work-up in the ER is reassuring.  It is prudent however that you follow-up with your primary care doctor and the cardiologist for further work-up.  We have establish an appointment with cardiologist next week at 2 PM.  Try and see if he can be seen by primary care doctor prior to that.   Please return to the ER if you have worsening chest pain, shortness of breath, pain radiating to your jaw, shoulder, or back, sweats or fainting. Return if the symptoms are getting worse. Otherwise see the Cardiologist or your primary care doctor as requested.

## 2019-09-30 NOTE — ED Provider Notes (Signed)
[facility] Provider Note   CSN: [id number] Arrival date & time: 09/30/19  0745     History Chief Complaint  Patient presents with  . Chest Pain  . Nausea    [name] [name] is a 39 y.o. female.  HPI  HPI: A 39 year old patient with a history of treated diabetes and obesity presents for evaluation of chest pain. Initial onset of pain was less than one hour ago. The patient's chest pain is described as heaviness/pressure/tightness and is not worse with exertion. The patient's chest pain is middle- or left-sided, is not well-localized, is not sharp and does not radiate to the arms/jaw/neck. The patient does not complain of nausea and denies diaphoresis. The patient has no history of stroke, has no history of peripheral artery disease, has not smoked in the past 90 days, has no relevant family history of coronary artery disease (first degree relative at less than age 25), is not hypertensive and has no history of hypercholesterolemia.   39 year old female comes in a chief complaint of chest pain, nausea, dizziness.  Patient has history of PCOS, bariatric surgery, prediabetes.  She does not smoke anymore, but has about 15 to 20 pack year history.  Patient reports over the last 2 days she has been having chest discomfort along with dizziness and nausea.  Chest pain is described as left-sided, pressure type pain that is intermittent and has no specific evoking, aggravating or relieving factors.  The pain is not pleuritic, exertional or positional.  Pain lasted for a few seconds or minutes.  Initially patient thought this could be anxiety as she has a lot of stress at work and in her personal life, however, her anxiety is typically different than this so she decided to come to the ER.  Patient is also noted some dizziness and lightheadedness.  Typically the symptoms are present when she gets up.  Upon walking or time the symptoms get better.  She noted that  her heart rate has been lower than usual.  She has had near fainting with dizziness.  Past Medical History:  Diagnosis Date  . Abnormal uterine bleeding   . Amenorrhea   . Anemia   . Anxiety    panic attacks  . Back pain   . Bradycardia   . Chest pain   . Constipation   . Diabetes ([place])   . Dysmenorrhea   . Endometriosis   . Gallbladder problem   . GERD (gastroesophageal reflux disease)    occasional - diet controlled and tums prn  . HLD (hyperlipidemia)   . HTN (hypertension)   . IBS (irritable bowel syndrome)   . Infertility, female   . Insulin resistance   . Migraine without aura   . Missed abortion    x 2 - both resolved without surgery  . Obesity   . PCOS (polycystic ovarian syndrome)   . PCOS (polycystic ovarian syndrome)   . PONV (postoperative nausea and vomiting)    pe rpatient ; scopalamine patch very helpful   . Prediabetes   . Sleep apnea   . SVD (spontaneous vaginal delivery)    x 1  . Urinary incontinence   . Vitamin D deficiency     Patient Active Problem List   Diagnosis Date Noted  . Vitamin D deficiency 08/06/2019  . Constipation 08/06/2019  . Endometriosis   . Candidiasis, cutaneous 07/14/2018  . Flu-like symptoms 07/09/2018  . Pharyngitis 07/09/2018  . GAD (generalized anxiety disorder) 06/02/2018  .  Dysuria 03/27/2018  . Family history of Parkinsonism 03/27/2018  . Dizziness 03/27/2018  . Bradycardia 03/23/2018  . HLD (hyperlipidemia) 03/23/2018  . Coronary atherosclerosis 03/23/2018  . Chest pain 03/22/2018  . Varicose vein of leg 12/24/2017  . Venous reflux 12/24/2017  . Leukocytosis 09/29/2017  . Nausea 09/29/2017  . Anxiety 09/29/2017  . Pancreatitis 09/28/2017  . Dehydration 03/12/2017  . 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 Sleep apnea 01/09/2017  . Healthcare maintenance 10/18/2016  . Type 2 diabetes mellitus without complication, without long-term current use of insulin ([place]) 10/18/2016  . LOW BACK  PAIN, ACUTE 11/20/2006  . POLYCYSTIC OVARY 07/04/2006  . OBESITY, NOS 07/04/2006  . TOBACCO USE, QUIT 07/04/2006    Past Surgical History:  Procedure Laterality Date  . CHOLECYSTECTOMY    . COLONOSCOPY  2017   polyps  . CYSTOSCOPY N/A 10/29/2016   Procedure: CYSTOSCOPY;  Surgeon: Salvadore Dom, MD;  Location: [facility];  Service: Gynecology;  Laterality: N/A;  . DILATATION & CURRETTAGE/HYSTEROSCOPY WITH RESECTOCOPE N/A 10/17/2012   Procedure: DILATATION & CURETTAGE/HYSTEROSCOPY WITH RESECTOCOPE; Possible Polypectomy, Possible Resectoscopic Myomectomy.;  Surgeon: Floyce Stakes. Pamala Hurry, MD;  Location: [facility];  Service: Gynecology;  Laterality: N/A;  1 hr.  Marland Kitchen DILATION AND CURETTAGE OF UTERUS    . ENDOMETRIAL BIOPSY    . ENDOVENOUS ABLATION SAPHENOUS VEIN W/ LASER Right 05/08/2018   endovenous laser ablation right greater saphenous vein and stab phlebectomy > 20 incisions right leg by Deitra Mayo MD   . ESOPHAGOGASTRODUODENOSCOPY ENDOSCOPY  2017   Normal  . HYSTEROSCOPY WITH D & C N/A 12/01/2013   Procedure: IUD Removal;  Surgeon: Floyce Stakes. Pamala Hurry, MD;  Location: [facility];  Service: Gynecology;  Laterality: N/A;  90 min.  . INTRAUTERINE DEVICE (IUD) INSERTION N/A 10/17/2012   Procedure: INTRAUTERINE DEVICE (IUD) INSERTION; Mirena;  Surgeon: Floyce Stakes. Pamala Hurry, MD;  Location: [facility];  Service: Gynecology;  Laterality: N/A;  . LAPAROSCOPIC GASTRIC SLEEVE RESECTION N/A 02/19/2017   Procedure: LAPAROSCOPIC GASTRIC SLEEVE RESECTION, UPPER ENDOSCOPY;  Surgeon: Johnathan Hausen, MD;  Location: [facility];  Service: General;  Laterality: N/A;  . LAPAROSCOPIC SALPINGO OOPHERECTOMY Left 10/29/2016   Procedure: LAPAROSCOPIC SALPINGO OOPHORECTOMY With anterior uterine biopsy;  Surgeon: Salvadore Dom, MD;  Location: [facility];  Service: Gynecology;  Laterality: Left;  . LASIK  10/06/2018  . MOUTH SURGERY     wisdom teeth     OB History    Gravida  3   Para  1   Term  1   Preterm      AB  2    Living  1     SAB  2   TAB      Ectopic      Multiple      Live Births  1           Family History  Problem Relation Age of Onset  . Diabetes Father   . Thyroid disease Father   . Hypertension Father   . Parkinson's disease Father   . Dementia Father   . Hyperlipidemia Father   . Heart disease Father   . Sleep apnea Father   . Obesity Father   . Diabetes Maternal Grandmother   . Heart disease Maternal Grandmother   . Diabetes Maternal Grandfather   . Heart disease Maternal Grandfather   . Stroke Maternal Grandfather   . Diabetes Paternal Grandmother   . Diabetes Paternal Grandfather   . Heart disease Paternal Grandfather   .  Alcohol abuse Paternal Grandfather   . Hypertension Mother   . Diabetes Mother   . Depression Mother   . Obesity Mother   . Colon cancer Maternal Uncle     Social History   Tobacco Use  . Smoking status: Former Smoker    Packs/day: 0.25    Years: 19.00    Pack years: 4.75    Types: Cigarettes    Quit date: 06/06/2016    Years since quitting: 3.3  . Smokeless tobacco: Never Used  Substance Use Topics  . Alcohol use: Yes    Comment: 1 a month  . Drug use: No    Home Medications Prior to Admission medications   Medication Sig Start Date End Date Taking? Authorizing Provider  APPLE CIDER VINEGAR PO Take 1 tablet by mouth daily.   Yes [provider]  ascorbic acid (VITAMIN C) 500 MG tablet Take 500 mg by mouth daily.   Yes [provider]  Biotin w/ Vitamins C & E (HAIR/SKIN/NAILS PO) Take 1 tablet by mouth daily.    Yes [provider]  Chromium Picolinate 200 MCG CAPS Take 1 capsule by mouth daily.   Yes [provider]  Coenzyme Q10 (COQ10) 100 MG CAPS Take 1 capsule by mouth daily.   Yes [provider]  cyclobenzaprine (FLEXERIL) 10 MG tablet Take 1 tablet (10 mg total) by mouth 3 (three) times daily as needed for muscle spasms. 05/18/19  Yes Danford, Valetta Fuller D, NP  diazepam  (VALIUM) 5 MG tablet Take 1 tablet (5 mg total) by mouth every 8 (eight) hours as needed for anxiety. 10/18/16  Yes Danford, Berna Spare, NP  Docosahexaenoic Acid (DHA COMPLETE PO) Take 1 tablet by mouth daily.   Yes [provider]  IRON PO Take 45 mg by mouth every other day.   Yes [provider]  levonorgestrel (MIRENA) 20 MCG/24HR IUD 1 each by Intrauterine route once.   Yes [provider]  loratadine (CLARITIN) 10 MG tablet Take 10 mg by mouth daily as needed for allergies.   Yes [provider]  metFORMIN (GLUCOPHAGE) 500 MG tablet Take 1 tablet (500 mg total) by mouth every morning. 09/21/19  Yes Whitmire, Dawn W, FNP  Multiple Vitamins-Minerals (BARIATRIC MULTIVITAMINS/IRON PO) Take 1 tablet by mouth in the morning and at bedtime.   Yes [provider]  nystatin cream (MYCOSTATIN) Apply 1 application topically 2 (two) times daily. Apply to affected area BID for up to 7 days. Patient taking differently: Apply 1 application topically 2 (two) times daily as needed for dry skin.  06/04/19  Yes Salvadore Dom, MD  ondansetron (ZOFRAN) 8 MG tablet Take 1 tablet (8 mg total) by mouth every 8 (eight) hours as needed for nausea or vomiting. 07/09/18  Yes Danford, Valetta Fuller D, NP  Probiotic Product (PROBIOTIC ADVANCED PO) Take 1 capsule by mouth daily.    Yes [provider]  Spirulina 500 MG TABS Take 1 tablet by mouth daily.   Yes [provider]  Vitamin D, Ergocalciferol, (DRISDOL) 1.25 MG (50000 UNIT) CAPS capsule Take 1 capsule (50,000 Units total) by mouth every 7 (seven) days. 09/21/19  Yes Whitmire, Joneen Boers, FNP    Allergies    Iohexol and Pyridium [phenazopyridine hcl]  Review of Systems   Review of Systems  Constitutional: Positive for activity change.  Eyes: Negative for visual disturbance.  Respiratory: Negative for shortness of breath.   Cardiovascular: Positive for chest pain.  Gastrointestinal: Positive for nausea.  Negative  for vomiting.  Neurological: Positive for dizziness and light-headedness.  All other systems reviewed and are negative.   Physical Exam Updated Vital Signs BP 107/64   Pulse 69   Temp 98.4 F (36.9 C) (Oral)   Resp 18   Ht 5\' 8"  (1.727 m)   Wt 104.3 kg   SpO2 99%   BMI 34.97 kg/m   Physical Exam Vitals and nursing note reviewed.  Constitutional:      Appearance: She is well-developed.  HENT:     Head: Normocephalic and atraumatic.  Eyes:     Pupils: Pupils are equal, round, and reactive to light.     Comments: No nystagmus  Neck:     Comments: No meningismus Cardiovascular:     Rate and Rhythm: Normal rate and regular rhythm.     Heart sounds: Normal heart sounds. No murmur.  Pulmonary:     Effort: Pulmonary effort is normal. No respiratory distress.     Breath sounds: Normal breath sounds.  Abdominal:     General: There is no distension.     Palpations: Abdomen is soft.     Tenderness: There is no abdominal tenderness. There is no guarding or rebound.  Musculoskeletal:     Cervical back: Normal range of motion and neck supple.  Skin:    General: Skin is warm and dry.  Neurological:     Mental Status: She is alert and oriented to person, place, and time.     Comments: Cerebellar exam is normal (finger to nose) Sensory exam normal for bilateral upper and lower extremities - and patient is able to discriminate between sharp and dull. Motor exam is 4+/5      ED Results / Procedures / Treatments   Labs (all labs ordered are listed, but only abnormal results are displayed) Labs Reviewed  BASIC METABOLIC PANEL - Abnormal; Notable for the following components:      Result Value   Creatinine, Ser 1.05 (*)    All other components within normal limits  CBC  TSH  I-STAT BETA HCG BLOOD, ED ([facility], [facility], [facility] ONLY)  TROPONIN I (HIGH SENSITIVITY)  TROPONIN I (HIGH SENSITIVITY)    EKG EKG Interpretation  Date/Time:  Wednesday Sep 30 2019 07:49:32 EDT Ventricular Rate:   62 PR Interval:  174 QRS Duration: 104 QT Interval:  414 QTC Calculation: 420 R Axis:   72 Text Interpretation: Normal sinus rhythm with sinus arrhythmia Normal ECG No acute changes Confirmed by Varney Biles [phone]) on 09/30/2019 12:44:03 PM      Radiology DG Chest 2 View  Result Date: 09/30/2019 CLINICAL DATA:  Chest pain since last evening. EXAM: CHEST - 2 VIEW COMPARISON:  03/22/2018 FINDINGS: The cardiac silhouette, mediastinal and hilar contours are normal. The lungs are clear. No pleural effusion. No pulmonary lesions. The bony thorax is intact. IMPRESSION: No acute cardiopulmonary findings. Electronically Signed   By: Marijo Sanes M.D.   On: 09/30/2019 08:27    Procedures Procedures (including critical care time)  Medications Ordered in ED Medications  ondansetron (ZOFRAN-ODT) disintegrating tablet 4 mg (4 mg Oral Given 09/30/19 0754)    ED Course  I have reviewed the triage vital signs and the nursing notes.  Pertinent labs & imaging results that were available during my care of the patient were reviewed by me and considered in my medical decision making (see chart for details).    MDM Rules/Calculators/A&P HEAR Score: 2  39 year old comes in a chief complaint of chest pain, nausea, dizziness. Her EKG shows heart rate in the 60s, however during my evaluation I noted that her heart rate dropped to the 40s.  She is in sinus bradycardia during that event.  Differential diagnosis includes ACS, sick sinus syndrome, hypothyroidism, adrenal insufficiency, orthostatic hypotension.  Patient denies any tick bites or outside activities.  Medication history and drug history is benign.  Family history is overall reassuring from arrhythmia perspective (older family members have pacemaker) and there is no ACS in young people.  Hear score is 2.  Delta troponin are negative.  Given her bradycardia and associated dizziness and near syncope, I did discuss the case  with Dr. Martinique, cardiology to see if patient would benefit with admission for near syncope and cardiac engagement.  Dr. Martinique did not think that the dizziness was directly a result of the bradycardia, and so he is comfortable with outpatient follow-up.  With hear score of 2, both him and I agree that she does not need emergent ACS work-up.  During orthostatic patient did inform me that she got dizzy when she got up.  Upon ambulation however she reports that her dizziness improved.  She has PCOS, which has predisposition for thrombosis and CAD, but PE is not high on the differential given that there is no signs of DVT and her heart rate is low but not fast and there is no pleuritic chest pain or shortness of breath.   For now we have decided to give patient work note for the next week.  I called cardiology as patient has been between PCP and secured a follow-up appointment for next week.  She might benefit with Holter monitor.   Strict ER return precautions have been discussed, and patient is agreeing with the plan and is comfortable with the workup done and the recommendations from the ER.  She will return to the ER if her symptoms get worse or if she starts having any new chest pain, fainting spell.  She has been advised not to drive.  Work note has been provided for 1 week to allow her to stay home.  Final Clinical Impression(s) / ED Diagnoses Final diagnoses:  Sinus bradycardia  Postural dizziness with near syncope    Rx / DC Orders ED Discharge Orders    None       Varney Biles, MD 09/30/19 1547

## 2019-10-08 ENCOUNTER — Other Ambulatory Visit: Payer: Self-pay

## 2019-10-08 ENCOUNTER — Encounter: Payer: Self-pay | Admitting: Cardiology

## 2019-10-08 ENCOUNTER — Telehealth: Payer: Self-pay | Admitting: Radiology

## 2019-10-08 ENCOUNTER — Ambulatory Visit (INDEPENDENT_AMBULATORY_CARE_PROVIDER_SITE_OTHER): Payer: No Typology Code available for payment source | Admitting: Cardiology

## 2019-10-08 VITALS — BP 98/82 | HR 58 | Ht 68.0 in | Wt 231.2 lb

## 2019-10-08 DIAGNOSIS — R55 Syncope and collapse: Secondary | ICD-10-CM

## 2019-10-08 DIAGNOSIS — Z8249 Family history of ischemic heart disease and other diseases of the circulatory system: Secondary | ICD-10-CM | POA: Diagnosis not present

## 2019-10-08 DIAGNOSIS — I2510 Atherosclerotic heart disease of native coronary artery without angina pectoris: Secondary | ICD-10-CM

## 2019-10-08 DIAGNOSIS — R079 Chest pain, unspecified: Secondary | ICD-10-CM

## 2019-10-08 DIAGNOSIS — R001 Bradycardia, unspecified: Secondary | ICD-10-CM | POA: Diagnosis not present

## 2019-10-08 DIAGNOSIS — Z7189 Other specified counseling: Secondary | ICD-10-CM

## 2019-10-08 DIAGNOSIS — R002 Palpitations: Secondary | ICD-10-CM

## 2019-10-08 NOTE — Progress Notes (Signed)
Cardiology Office Note:    Date:  10/08/2019   ID:  [name], DOB 08/10/1980, MRN [id number]  PCP:  Patient, No Pcp Per  Cardiologist:  Buford Dresser, MD  Referring MD: Dr. Kathrynn Humble  CC: new patient evaluation for bradycardia and chest tightness  History of Present Illness:    [name] [name] is a 39 y.o. female with a hx of PCOS, prior bariatric surgery, former tobacco use who is seen as a new consult at the request of Dr. Kathrynn Humble for the evaluation and management of bradycardia and chest heaviness.  Reviewed ER note from Dr. Kathrynn Humble dated 09/30/19. Patient presented with chest heaviness/discomfort. Also noted dizziness/lightheadedness with presyncope. Rhythm strip captured sinus bradycardia at 47 bpm. I cannot see documented orthostatic vitial signs, but notes state she was symptomatic when orthostatic vitals were done.  Patient concerns today: Heart rate gets low, feels dizzy/lightheaded. Happens at any time. Other days feels like her heart rate stays 90-100s.   Chest pain sensation varies. Sometimes related to heart rate, sometimes not.   After both chest pain and heart rate episodes, she has a horrible headache afterward. In general, this has been going on for several years. Thought she might be having an MI in 2019, was admitted to the hospital for this. Seen by Dr. Caryl Comes and Dr. Oval Linsey. CT coronary with minimal nonobstructive CAD, calcium score 46. Wore 30 day monitor, but she was out of work the entire time, does not feel that it was representative.   Cardiovascular risk factors: Prior clinical ASCVD: none Comorbid conditions: Denies hypertension, chronic kidney disease. Endorses hyperlipidemia, diabetes before bariatric surgery but now resolved Metabolic syndrome/Obesity: Highest adult weight 329 lbs, lowest 221 lbs post op. Chronic inflammatory conditions: none Tobacco use history: former, quit  Family history: multiple family members with heart attacks and  stents. Mat Gpa had bypass surgery and a pacemaker. Father has heart issues, had cath recently but no stent. Mother has mitral valve prolapse and palpitations. [name] [name], paternal great-gpa, and uncle all had MI.  Prior cardiac testing and/or incidental findings on other testing (ie coronary calcium): coronary CTA, event monitor reviewed Exercise level: works out 45 min of cardio 5x/week. Has a personal trainer twice a week.  Current diet: [facility] diet, high protein/low carb.  Last near syncope about three weeks ago. Doesn't go out in public without someone with her. Always has prodrome.  Ros positive for palpitations, esp at night. Intermittent hematuria.  Past Medical History:  Diagnosis Date  . Abnormal uterine bleeding   . Amenorrhea   . Anemia   . Anxiety    panic attacks  . Back pain   . Bradycardia   . Chest pain   . Constipation   . Diabetes ([place])   . Dysmenorrhea   . Endometriosis   . Gallbladder problem   . GERD (gastroesophageal reflux disease)    occasional - diet controlled and tums prn  . HLD (hyperlipidemia)   . HTN (hypertension)   . IBS (irritable bowel syndrome)   . Infertility, female   . Insulin resistance   . Migraine without aura   . Missed abortion    x 2 - both resolved without surgery  . Obesity   . PCOS (polycystic ovarian syndrome)   . PCOS (polycystic ovarian syndrome)   . PONV (postoperative nausea and vomiting)    pe rpatient ; scopalamine patch very helpful   . Prediabetes   . Sleep apnea   . SVD (  spontaneous vaginal delivery)    x 1  . Urinary incontinence   . Vitamin D deficiency     Past Surgical History:  Procedure Laterality Date  . CHOLECYSTECTOMY    . COLONOSCOPY  2017   polyps  . CYSTOSCOPY N/A 10/29/2016   Procedure: CYSTOSCOPY;  Surgeon: Salvadore Dom, MD;  Location: [facility];  Service: Gynecology;  Laterality: N/A;  . DILATATION & CURRETTAGE/HYSTEROSCOPY WITH RESECTOCOPE N/A 10/17/2012   Procedure:  DILATATION & CURETTAGE/HYSTEROSCOPY WITH RESECTOCOPE; Possible Polypectomy, Possible Resectoscopic Myomectomy.;  Surgeon: Floyce Stakes. Pamala Hurry, MD;  Location: [facility];  Service: Gynecology;  Laterality: N/A;  1 hr.  Marland Kitchen DILATION AND CURETTAGE OF UTERUS    . ENDOMETRIAL BIOPSY    . ENDOVENOUS ABLATION SAPHENOUS VEIN W/ LASER Right 05/08/2018   endovenous laser ablation right greater saphenous vein and stab phlebectomy > 20 incisions right leg by Deitra Mayo MD   . ESOPHAGOGASTRODUODENOSCOPY ENDOSCOPY  2017   Normal  . HYSTEROSCOPY WITH D & C N/A 12/01/2013   Procedure: IUD Removal;  Surgeon: Floyce Stakes. Pamala Hurry, MD;  Location: [facility];  Service: Gynecology;  Laterality: N/A;  90 min.  . INTRAUTERINE DEVICE (IUD) INSERTION N/A 10/17/2012   Procedure: INTRAUTERINE DEVICE (IUD) INSERTION; Mirena;  Surgeon: Floyce Stakes. Pamala Hurry, MD;  Location: [facility];  Service: Gynecology;  Laterality: N/A;  . LAPAROSCOPIC GASTRIC SLEEVE RESECTION N/A 02/19/2017   Procedure: LAPAROSCOPIC GASTRIC SLEEVE RESECTION, UPPER ENDOSCOPY;  Surgeon: Johnathan Hausen, MD;  Location: [facility];  Service: General;  Laterality: N/A;  . LAPAROSCOPIC SALPINGO OOPHERECTOMY Left 10/29/2016   Procedure: LAPAROSCOPIC SALPINGO OOPHORECTOMY With anterior uterine biopsy;  Surgeon: Salvadore Dom, MD;  Location: [facility];  Service: Gynecology;  Laterality: Left;  . LASIK  10/06/2018  . MOUTH SURGERY     wisdom teeth    Current Medications: Current Outpatient Medications on File Prior to Visit  Medication Sig  . APPLE CIDER VINEGAR PO Take 1 tablet by mouth daily.  Marland Kitchen ascorbic acid (VITAMIN C) 500 MG tablet Take 500 mg by mouth daily.  . Biotin w/ Vitamins C & E (HAIR/SKIN/NAILS PO) Take 1 tablet by mouth daily.   . Chromium Picolinate 200 MCG CAPS Take 1 capsule by mouth daily.  . Coenzyme Q10 (COQ10) 100 MG CAPS Take 1 capsule by mouth daily.  . cyclobenzaprine (FLEXERIL) 10 MG tablet Take 1 tablet (10 mg total) by mouth 3 (three) times  daily as needed for muscle spasms.  . diazepam (VALIUM) 5 MG tablet Take 1 tablet (5 mg total) by mouth every 8 (eight) hours as needed for anxiety.  . Docosahexaenoic Acid (DHA COMPLETE PO) Take 1 tablet by mouth daily.  . IRON PO Take 45 mg by mouth every other day.  . levonorgestrel (MIRENA) 20 MCG/24HR IUD 1 each by Intrauterine route once.  . loratadine (CLARITIN) 10 MG tablet Take 10 mg by mouth daily as needed for allergies.  . metFORMIN (GLUCOPHAGE) 500 MG tablet Take 1 tablet (500 mg total) by mouth every morning.  . Multiple Vitamins-Minerals (BARIATRIC MULTIVITAMINS/IRON PO) Take 1 tablet by mouth in the morning and at bedtime.  Marland Kitchen nystatin cream (MYCOSTATIN) Apply 1 application topically 2 (two) times daily. Apply to affected area BID for up to 7 days. (Patient taking differently: Apply 1 application topically 2 (two) times daily as needed for dry skin. )  . ondansetron (ZOFRAN) 8 MG tablet Take 1 tablet (8 mg total) by mouth every 8 (eight) hours as needed for nausea or vomiting.  Marland Kitchen  Probiotic Product (PROBIOTIC ADVANCED PO) Take 1 capsule by mouth daily.   Marland Kitchen Spirulina 500 MG TABS Take 1 tablet by mouth daily.  . Vitamin D, Ergocalciferol, (DRISDOL) 1.25 MG (50000 UNIT) CAPS capsule Take 1 capsule (50,000 Units total) by mouth every 7 (seven) days.   No current facility-administered medications on file prior to visit.     Allergies:   Iohexol and Pyridium [phenazopyridine hcl]   Social History   Tobacco Use  . Smoking status: Former Smoker    Packs/day: 0.25    Years: 19.00    Pack years: 4.75    Types: Cigarettes    Quit date: 06/06/2016    Years since quitting: 3.3  . Smokeless tobacco: Never Used  Substance Use Topics  . Alcohol use: Yes    Comment: 1 a month  . Drug use: No    Family History: family history includes Alcohol abuse in her paternal grandfather; Colon cancer in her maternal uncle; Dementia in her father; Depression in her mother; Diabetes in her father,  maternal grandfather, maternal grandmother, mother, paternal grandfather, and paternal grandmother; Heart disease in her father, maternal grandfather, maternal grandmother, and paternal grandfather; Hyperlipidemia in her father; Hypertension in her father and mother; Obesity in her father and mother; Parkinson's disease in her father; Sleep apnea in her father; Stroke in her maternal grandfather; Thyroid disease in her father.  ROS:   Please see the history of present illness.  Additional pertinent ROS: Constitutional: Negative for chills, fever, night sweats, unintentional weight loss  HENT: Negative for ear pain and hearing loss.   Eyes: Negative for loss of vision and eye pain.  Respiratory: Negative for cough, sputum, wheezing.   Cardiovascular: See HPI. Gastrointestinal: Negative for abdominal pain, melena, and hematochezia.  Genitourinary: Negative for dysuria and hematuria.  Musculoskeletal: Negative for falls and myalgias.  Skin: Negative for itching and rash.  Neurological: Negative for focal weakness, focal sensory changes and loss of consciousness.  Endo/Heme/Allergies: Does not bruise/bleed easily.     EKGs/Labs/Other Studies Reviewed:    The following studies were reviewed today: Monitor 04/21/18 NSR Rare PAC/PVC s  Symptoms do not correlate with arrhythmia Average HR 69 bpm  CT coronary 03/23/18 Coronary Arteries:  Normal coronary origin.  Right dominance.  RCA is a large dominant artery that gives rise to PDA and PLVB. There is no plaque.  Left main is a large artery that gives rise to LAD and LCX arteries. Left main has no plaque.  LAD is a large vessel that gives rise to two diagonal arteries. Proximal LAD has mild calcified plaque with associated stenosis 0-25%. Mid LAD has a mild non-calcified plaque with associated stenosis 0-25%.  LCX is a small non-dominant artery that gives rise to one small OM1 branch. There is no plaque.  Other  findings:  Normal pulmonary vein drainage into the left atrium.  Normal let atrial appendage without a thrombus.  IMPRESSION: 1. Coronary calcium score of 46. This was 57 percentile for age and sex matched control.  2. Normal coronary origin with right dominance.  3. 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 Aggressive medical management is recommended.  4. Mildly dilated pulmonary artery measuring 32 mm suggestive of pulmonary hypertension.   EKG:  EKG is personally reviewed.  The ekg ordered today demonstrates sinus bradycardia at 47 bpm  Recent Labs: 07/20/2019: ALT 10 09/30/2019: BUN 17; Creatinine, Ser 1.05; Hemoglobin 13.4; Platelets 268; Potassium 4.0; Sodium 138; TSH 0.764  Recent Lipid Panel  Component Value Date/Time   CHOL 204 (H) 06/18/2019 1316   TRIG 93 06/18/2019 1316   HDL 58 06/18/2019 1316   CHOLHDL 3.7 01/08/2019 0847   CHOLHDL 3.5 03/23/2018 0623   VLDL 8 03/23/2018 0623   LDLCALC 129 (H) 06/18/2019 1316    Physical Exam:    VS:  BP 98/82   Pulse (!) 58   Ht 5\' 8"  (1.727 m)   Wt 231 lb 3.2 oz (104.9 kg)   SpO2 99%   BMI 35.15 kg/m    Orthostatics: Lying 101/69, HR 47 Sitting 97/66, HR 60 Standing 101/64, HR 64 Standing 2 min 102/71, HR 71  Wt Readings from Last 3 Encounters:  10/08/19 231 lb 3.2 oz (104.9 kg)  09/30/19 230 lb (104.3 kg)  09/21/19 232 lb (105.2 kg)    GEN: Well nourished, well developed in no acute distress HEENT: Normal, moist mucous membranes NECK: No JVD CARDIAC: regular rhythm, normal S1 and S2, no rubs or gallops. No murmurs. VASCULAR: Radial and DP pulses 2+ bilaterally. No carotid bruits RESPIRATORY:  Clear to auscultation without rales, wheezing or rhonchi  ABDOMEN: Soft, non-tender, non-distended MUSCULOSKELETAL:  Ambulates independently SKIN: Warm and dry, no edema NEUROLOGIC:  Alert and oriented x 3. No focal neuro deficits noted. PSYCHIATRIC:  Normal affect    ASSESSMENT:    1.  Near syncope   2. Bradycardia   3. Chest pain, unspecified type   4. Family history of heart disease   5. Heart palpitations   6. Cardiac risk counseling   7. Counseling on health promotion and disease prevention   8. Nonocclusive coronary atherosclerosis of native coronary artery    PLAN:    Near syncope, bradycardia, palpitations: -not orthostatic on vitals -given level of physical activity, this may be a normal vagal tone and heart rate response -will pursue monitor as she has had more events recently  Chest pain: prior reassuring CT coronary, has minimal nonobstructive CAD. Not suggestive of ischemia -counseled on red flag warning signs that need immediate medical attention  Cardiac risk counseling and prevention recommendations: especially with family history of heart disease -recommend heart healthy/Mediterranean diet, with whole grains, fruits, vegetable, fish, lean meats, nuts, and olive oil. Limit salt. -recommend moderate walking, 3-5 times/week for 30-50 minutes each session. Aim for at least 150 minutes.week. Goal should be pace of 3 miles/hours, or walking 1.5 miles in 30 minutes -recommend avoidance of tobacco products. Avoid excess alcohol. -ASCVD risk score: The ASCVD Risk score Mikey Bussing DC Jr., et al., 2013) failed to calculate for the following reasons:   The 2013 ASCVD risk score is only valid for ages 100 to 53    Plan for follow up: if monitor unrevealing, follow up every 2 years given family history  Buford Dresser, MD, PhD [place]  [facility]    Medication Adjustments/Labs and Tests Ordered: Current medicines are reviewed at length with the patient today.  Concerns regarding medicines are outlined above.  Orders Placed This Encounter  Procedures  . LONG TERM MONITOR (3-14 DAYS)  . EKG 12-Lead   No orders of the defined types were placed in this encounter.   Patient Instructions  Medication Instructions:  Your Physician recommend you  continue on your current medication as directed.    *If you need a refill on your cardiac medications before your next appointment, please call your pharmacy*   Lab Work: None  Testing/Procedures: Our physician has recommended that you wear an [place] monitor. The  Zio patch cardiac monitor continuously records heart rhythm data for up to 14 days, this is for patients being evaluated for multiple types heart rhythms. For the first 24 hours post application, please avoid getting the Zio monitor wet in the shower or by excessive sweating during exercise. After that, feel free to carry on with regular activities. Keep soaps and lotions away from the ZIO XT Patch.   Someone from our office will call to mail monitor       Follow-Up: At [facility], you and your health needs are our priority.  As part of our continuing mission to provide you with exceptional heart care, we have created designated Provider Care Teams.  These Care Teams include your primary Cardiologist (physician) and Advanced Practice Providers (APPs -  Physician Assistants and Nurse Practitioners) who all work together to provide you with the care you need, when you need it.  We recommend signing up for the patient portal called "MyChart".  Sign up information is provided on this After Visit Summary.  MyChart is used to connect with patients for Virtual Visits (Telemedicine).  Patients are able to view lab/test results, encounter notes, upcoming appointments, etc.  Non-urgent messages can be sent to your provider as well.   To learn more about what you can do with MyChart, go to [email or web address].    Your next appointment:   2 year(s)  The format for your next appointment:   In Person  Provider:   Buford Dresser, MD  [place] Instructions   Your physician has requested you wear your ZIO patch monitor__14_____days.   This is a single patch monitor.  [employer] supplies one patch  monitor per enrollment.  Additional stickers are not available.   Please do not apply patch if you will be having a Nuclear Stress Test, Echocardiogram, Cardiac CT, MRI, or Chest Xray during the time frame you would be wearing the monitor. The patch cannot be worn during these tests.  You cannot remove and re-apply the ZIO XT patch monitor.   Your ZIO patch monitor will be sent USPS Priority mail from [facility] directly to your home address. The monitor may also be mailed to a PO BOX if home delivery is not available.   It may take 3-5 days to receive your monitor after you have been enrolled.   Once you have received you monitor, please review enclosed instructions.  Your monitor has already been registered assigning a specific monitor serial # to you.   Applying the monitor   Shave hair from upper left chest.   Hold abrader disc by orange tab.  Rub abrader in 40 strokes over left upper chest as indicated in your monitor instructions.   Clean area with 4 enclosed alcohol pads .  Use all pads to assure are is cleaned thoroughly.  Let dry.   Apply patch as indicated in monitor instructions.  Patch will be place under collarbone on left side of chest with arrow pointing upward.   Rub patch adhesive wings for 2 minutes.Remove white label marked "1".  Remove white label marked "2".  Rub patch adhesive wings for 2 additional minutes.   While looking in a mirror, press and release button in center of patch.  A small green light will flash 3-4 times .  This will be your only indicator the monitor has been turned on.     Do not shower for the first 24 hours.  You may shower after the first 24  hours.   Press button if you feel a symptom. You will hear a small click.  Record Date, Time and Symptom in the Patient Log Book.   When you are ready to remove patch, follow instructions on last 2 pages of Patient Log Book.  Stick patch monitor onto last page of Patient Log Book.   Place Patient  Log Book in [place] box.  Use locking tab on box and tape box closed securely.  The [employer] and [employer] has [employer] on it.  Please place in mailbox as soon as possible.  Your physician should have your test results approximately 7 days after the monitor has been mailed back to [facility].   Call [place] at [phone] if you have questions regarding your ZIO XT patch monitor.  Call them immediately if you see an orange light blinking on your monitor.   If your monitor falls off in less than 4 days contact our Monitor department at [phone].  If your monitor becomes loose or falls off after 4 days call [employer] at [phone] for suggestions on securing your monitor.      Signed, Buford Dresser, MD PhD 10/08/2019  [facility]

## 2019-10-08 NOTE — Patient Instructions (Signed)
Medication Instructions:  Your Physician recommend you continue on your current medication as directed.    *If you need a refill on your cardiac medications before your next appointment, please call your pharmacy*   Lab Work: None  Testing/Procedures: Our physician has recommended that you wear an [place] monitor. The Zio patch cardiac monitor continuously records heart rhythm data for up to 14 days, this is for patients being evaluated for multiple types heart rhythms. For the first 24 hours post application, please avoid getting the Zio monitor wet in the shower or by excessive sweating during exercise. After that, feel free to carry on with regular activities. Keep soaps and lotions away from the ZIO XT Patch.   Someone from our office will call to mail monitor       Follow-Up: At [facility], you and your health needs are our priority.  As part of our continuing mission to provide you with exceptional heart care, we have created designated Provider Care Teams.  These Care Teams include your primary Cardiologist (physician) and Advanced Practice Providers (APPs -  Physician Assistants and Nurse Practitioners) who all work together to provide you with the care you need, when you need it.  We recommend signing up for the patient portal called "MyChart".  Sign up information is provided on this After Visit Summary.  MyChart is used to connect with patients for Virtual Visits (Telemedicine).  Patients are able to view lab/test results, encounter notes, upcoming appointments, etc.  Non-urgent messages can be sent to your provider as well.   To learn more about what you can do with MyChart, go to [email or web address].    Your next appointment:   2 year(s)  The format for your next appointment:   In Person  Provider:   Buford Dresser, MD  [place] Instructions   Your physician has requested you wear your ZIO patch monitor__14_____days.   This is  a single patch monitor.  [employer] supplies one patch monitor per enrollment.  Additional stickers are not available.   Please do not apply patch if you will be having a Nuclear Stress Test, Echocardiogram, Cardiac CT, MRI, or Chest Xray during the time frame you would be wearing the monitor. The patch cannot be worn during these tests.  You cannot remove and re-apply the ZIO XT patch monitor.   Your ZIO patch monitor will be sent USPS Priority mail from [facility] directly to your home address. The monitor may also be mailed to a PO BOX if home delivery is not available.   It may take 3-5 days to receive your monitor after you have been enrolled.   Once you have received you monitor, please review enclosed instructions.  Your monitor has already been registered assigning a specific monitor serial # to you.   Applying the monitor   Shave hair from upper left chest.   Hold abrader disc by orange tab.  Rub abrader in 40 strokes over left upper chest as indicated in your monitor instructions.   Clean area with 4 enclosed alcohol pads .  Use all pads to assure are is cleaned thoroughly.  Let dry.   Apply patch as indicated in monitor instructions.  Patch will be place under collarbone on left side of chest with arrow pointing upward.   Rub patch adhesive wings for 2 minutes.Remove white label marked "1".  Remove white label marked "2".  Rub patch adhesive wings for 2 additional minutes.   While  looking in a mirror, press and release button in center of patch.  A small green light will flash 3-4 times .  This will be your only indicator the monitor has been turned on.     Do not shower for the first 24 hours.  You may shower after the first 24 hours.   Press button if you feel a symptom. You will hear a small click.  Record Date, Time and Symptom in the Patient Log Book.   When you are ready to remove patch, follow instructions on last 2 pages of Patient Log Book.  Stick patch monitor  onto last page of Patient Log Book.   Place Patient Log Book in [place] box.  Use locking tab on box and tape box closed securely.  The [employer] has [employer] on it.  Please place in mailbox as soon as possible.  Your physician should have your test results approximately 7 days after the monitor has been mailed back to [facility].   Call [place] at [phone] if you have questions regarding your ZIO XT patch monitor.  Call them immediately if you see an orange light blinking on your monitor.   If your monitor falls off in less than 4 days contact our Monitor department at [phone].  If your monitor becomes loose or falls off after 4 days call [employer] at [phone] for suggestions on securing your monitor.

## 2019-10-08 NOTE — Telephone Encounter (Signed)
Enrolled patient for a 14 day Zio Monitor to be mailed to patients home  

## 2019-10-09 MED FILL — VIT D2 1.25 MG (50,000 UNIT: 1.25 MG | 28 days supply | Qty: 4 | Fill #0

## 2019-10-09 MED FILL — METFORMIN HCL 500 MG TABS: 500 | 30 days supply | Qty: 30 | Fill #0

## 2019-10-13 ENCOUNTER — Ambulatory Visit (INDEPENDENT_AMBULATORY_CARE_PROVIDER_SITE_OTHER): Payer: No Typology Code available for payment source

## 2019-10-13 DIAGNOSIS — R001 Bradycardia, unspecified: Secondary | ICD-10-CM | POA: Diagnosis not present

## 2019-10-13 DIAGNOSIS — R55 Syncope and collapse: Secondary | ICD-10-CM | POA: Diagnosis not present

## 2019-10-14 ENCOUNTER — Ambulatory Visit (INDEPENDENT_AMBULATORY_CARE_PROVIDER_SITE_OTHER): Payer: No Typology Code available for payment source | Admitting: Family Medicine

## 2019-10-14 ENCOUNTER — Encounter (INDEPENDENT_AMBULATORY_CARE_PROVIDER_SITE_OTHER): Payer: Self-pay | Admitting: Family Medicine

## 2019-10-14 ENCOUNTER — Other Ambulatory Visit: Payer: Self-pay

## 2019-10-14 VITALS — BP 111/71 | HR 53 | Temp 98.0°F | Ht 68.0 in | Wt 229.0 lb

## 2019-10-14 DIAGNOSIS — R7303 Prediabetes: Secondary | ICD-10-CM | POA: Diagnosis not present

## 2019-10-14 DIAGNOSIS — E7849 Other hyperlipidemia: Secondary | ICD-10-CM

## 2019-10-14 DIAGNOSIS — E559 Vitamin D deficiency, unspecified: Secondary | ICD-10-CM | POA: Diagnosis not present

## 2019-10-14 DIAGNOSIS — Z6834 Body mass index (BMI) 34.0-34.9, adult: Secondary | ICD-10-CM

## 2019-10-14 DIAGNOSIS — E669 Obesity, unspecified: Secondary | ICD-10-CM

## 2019-10-14 DIAGNOSIS — Z9189 Other specified personal risk factors, not elsewhere classified: Secondary | ICD-10-CM | POA: Diagnosis not present

## 2019-10-14 MED ORDER — VITAMIN D (ERGOCALCIFEROL) 1.25 MG (50000 UNIT) PO CAPS: 50000.0000 [IU] | ORAL_CAPSULE | ORAL | 0 refills | Status: DC

## 2019-10-14 NOTE — Progress Notes (Signed)
Chief Complaint:   OBESITY [name] [name] is here to discuss her progress with her obesity treatment plan along with follow-up of her obesity related diagnoses. [name] is on the Category 2 Plan and keeping a food journal and adhering to recommended goals of 200-300 calories and 20 grams of proteinat breakfast daily and states she is following her eating plan approximately 80% of the time. [name] states she is doing cardio and resistance training for 30-45 minutes 5 times per week.  Today's visit was #: 7 Starting weight: 243 lbs Starting date: 20/03/2020 Today's weight: 229 lbs Today's date: 10/14/2019 Total lbs lost to date: 14 Total lbs lost since last in-office visit: 3  Interim History: Alina is eating protein as directed. She notes she feels "full" most of the time.  She thinks that she lacks appetite due to stress. She is going through a marital separation.. 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 She substitutes shakes for breakfast at times.  Subjective:   1. Vitamin D deficiency [name]'s last Vit D level was low at 33.3. She is not currently taking calcium. She is on prescription Vit D.  2. Pre-diabetes [name] has a diagnosis of pre-diabetes based on her elevated Hgb A1c and was informed this puts her at greater risk of developing diabetes. Last A1c was 5.7 and she denies polyphagia. She continues to work on diet and exercise to decrease her risk of diabetes.   Lab Results  Component Value Date   HGBA1C 5.7 (H) 06/18/2019   Lab Results  Component Value Date   INSULIN 10.1 06/18/2019   3. Other hyperlipidemia [name] has hyperlipidemia and has been trying to improve her cholesterol levels with intensive lifestyle modification including a low saturated fat diet, exercise and weight loss. Last LDL was high at 129, and HDL and triglycerides were within normal limits. She is not on statin.   Lab Results  Component Value Date   ALT 10 07/20/2019   AST 13  07/20/2019   ALKPHOS 74 07/20/2019   BILITOT 0.4 07/20/2019   Lab Results  Component Value Date   CHOL 204 (H) 06/18/2019   HDL 58 06/18/2019   LDLCALC 129 (H) 06/18/2019   TRIG 93 06/18/2019   CHOLHDL 3.7 01/08/2019    4. At risk for osteoporosis [name] is at higher risk of osteopenia and osteoporosis due to Vitamin D deficiency.   Assessment/Plan:   1. Vitamin D deficiency Low Vitamin D level contributes to fatigue and are associated with obesity, breast, and colon cancer. We will check labs today. [name] is to begin calcium 1,200 mg daily, and we will refill prescription Vitamin D for 1 month. She will follow-up for routine testing of Vitamin D, at least 2-3 times per year to avoid over-replacement.  - VITAMIN D 25 Hydroxy (Vit-D Deficiency, Fractures) - Vitamin D, Ergocalciferol, (DRISDOL) 1.25 MG (50000 UNIT) CAPS capsule; Take 1 capsule (50,000 Units total) by mouth every 7 (seven) days.  Dispense: 4 capsule; Refill: 0  2. Pre-diabetes [name] will continue to work on weight loss, exercise, and decreasing simple carbohydrates to help decrease the risk of diabetes. We will check labs today.  - Hemoglobin A1c - Insulin, random  3. Other hyperlipidemia Cardiovascular risk and specific lipid/LDL goals reviewed. We discussed several lifestyle modifications today and [name] will continue to work on diet, exercise and weight loss efforts. We will check labs today.  - Lipid Panel With LDL/HDL Ratio  4. At risk for osteoporosis Allysha was  given approximately 15 minutes of osteoporosis prevention counseling today. Sari is at risk for osteopenia and osteoporosis due to her Vitamin D deficiency. She was encouraged to take her Vitamin D and follow her higher calcium diet and increase strengthening exercise to help strengthen her bones and decrease her risk of osteopenia and osteoporosis.  Repetitive spaced learning was employed today to elicit superior memory formation and  behavioral change.  5. Class 1 obesity with serious comorbidity and body mass index (BMI) of 34.0 to 34.9 in adult, unspecified obesity type [name] is currently in the action stage of change. As such, her goal is to continue with weight loss efforts. She has agreed to the Category 2 Plan.   Exercise goals: As is.  Behavioral modification strategies: increasing lean protein intake and planning for success.  [name] has agreed to follow-up with our clinic in 3 weeks with [name], NP-C. She was informed of the importance of frequent follow-up visits to maximize her success with intensive lifestyle modifications for her multiple health conditions.   [name] was informed we would discuss her lab results at her next visit unless there is a critical issue that needs to be addressed sooner. [name] agreed to keep her next visit at the agreed upon time to discuss these results.  Objective:   Blood pressure 111/71, pulse (!) 53, temperature 98 F (36.7 C), temperature source Oral, height 5\' 8"  (1.727 m), weight 229 lb (103.9 kg), SpO2 99 %. Body mass index is 34.82 kg/m.  General: Cooperative, alert, well developed, in no acute distress. HEENT: Conjunctivae and lids unremarkable. Cardiovascular: Regular rhythm.  Lungs: Normal work of breathing. Neurologic: No focal deficits.   Lab Results  Component Value Date   CREATININE 1.05 (H) 09/30/2019   BUN 17 09/30/2019   NA 138 09/30/2019   K 4.0 09/30/2019   CL 106 09/30/2019   CO2 24 09/30/2019   Lab Results  Component Value Date   ALT 10 07/20/2019   AST 13 07/20/2019   ALKPHOS 74 07/20/2019   BILITOT 0.4 07/20/2019   Lab Results  Component Value Date   HGBA1C 5.7 (H) 06/18/2019   HGBA1C 5.3 01/08/2019   HGBA1C 5.5 05/30/2017   HGBA1C 6.4 01/09/2017   HGBA1C 7.2 (H) 10/10/2016   Lab Results  Component Value Date   INSULIN 10.1 06/18/2019   Lab Results  Component Value Date   TSH 0.764 09/30/2019   Lab Results    Component Value Date   CHOL 204 (H) 06/18/2019   HDL 58 06/18/2019   LDLCALC 129 (H) 06/18/2019   TRIG 93 06/18/2019   CHOLHDL 3.7 01/08/2019   Lab Results  Component Value Date   WBC 6.9 09/30/2019   HGB 13.4 09/30/2019   HCT 39.8 09/30/2019   MCV 89.2 09/30/2019   PLT 268 09/30/2019   Lab Results  Component Value Date   FERRITIN 9 (L) 03/14/2016   Attestation Statements:   Reviewed by clinician on day of visit: allergies, medications, problem list, medical history, surgical history, family history, social history, and previous encounter notes.   Wilhemena Durie, am acting as Location manager for [employer], FNP-C.  I have reviewed the above documentation for accuracy and completeness, and I agree with the above. -  Georgianne Fick, FNP

## 2019-10-15 ENCOUNTER — Encounter (INDEPENDENT_AMBULATORY_CARE_PROVIDER_SITE_OTHER): Payer: Self-pay | Admitting: Family Medicine

## 2019-10-15 DIAGNOSIS — R7303 Prediabetes: Secondary | ICD-10-CM | POA: Insufficient documentation

## 2019-10-15 LAB — INSULIN, RANDOM: INSULIN: 11.9 u[IU]/mL (ref 2.6–24.9)

## 2019-10-15 LAB — LIPID PANEL WITH LDL/HDL RATIO
Cholesterol, Total: 181 mg/dL (ref 100–199)
HDL: 44 mg/dL (ref >39)
LDL Chol Calc (NIH): 121 mg/dL — ABNORMAL HIGH (ref 0–99)
LDL/HDL Ratio: 2.8 ratio (ref 0.0–3.2)
Triglycerides: 85 mg/dL (ref 0–149)
VLDL Cholesterol Cal: 16 mg/dL (ref 5–40)

## 2019-10-15 LAB — HEMOGLOBIN A1C
Est. average glucose Bld gHb Est-mCnc: 114 mg/dL
Hgb A1c MFr Bld: 5.6 % (ref 4.8–5.6)

## 2019-10-15 LAB — VITAMIN D 25 HYDROXY (VIT D DEFICIENCY, FRACTURES): Vit D, 25-Hydroxy: 43.7 ng/mL (ref 30.0–100.0)

## 2019-11-04 ENCOUNTER — Ambulatory Visit (INDEPENDENT_AMBULATORY_CARE_PROVIDER_SITE_OTHER): Payer: No Typology Code available for payment source | Admitting: Adult Health

## 2019-11-04 ENCOUNTER — Other Ambulatory Visit: Payer: Self-pay

## 2019-11-04 ENCOUNTER — Encounter (INDEPENDENT_AMBULATORY_CARE_PROVIDER_SITE_OTHER): Payer: Self-pay | Admitting: Adult Health

## 2019-11-04 VITALS — BP 103/68 | HR 55 | Temp 98.1°F | Ht 68.0 in | Wt 226.0 lb

## 2019-11-04 DIAGNOSIS — E785 Hyperlipidemia, unspecified: Secondary | ICD-10-CM | POA: Insufficient documentation

## 2019-11-04 DIAGNOSIS — Z9189 Other specified personal risk factors, not elsewhere classified: Secondary | ICD-10-CM

## 2019-11-04 DIAGNOSIS — R55 Syncope and collapse: Secondary | ICD-10-CM

## 2019-11-04 DIAGNOSIS — Z6834 Body mass index (BMI) 34.0-34.9, adult: Secondary | ICD-10-CM

## 2019-11-04 DIAGNOSIS — E119 Type 2 diabetes mellitus without complications: Secondary | ICD-10-CM

## 2019-11-04 DIAGNOSIS — E559 Vitamin D deficiency, unspecified: Secondary | ICD-10-CM

## 2019-11-04 DIAGNOSIS — E1169 Type 2 diabetes mellitus with other specified complication: Secondary | ICD-10-CM

## 2019-11-04 DIAGNOSIS — E669 Obesity, unspecified: Secondary | ICD-10-CM

## 2019-11-04 MED ORDER — VITAMIN D (ERGOCALCIFEROL) 1.25 MG (50000 UNIT) PO CAPS: 50000.0000 [IU] | ORAL_CAPSULE | ORAL | 0 refills | Status: DC

## 2019-11-04 MED ORDER — METFORMIN HCL 500 MG PO TABS: 500.0000 mg | ORAL_TABLET | Freq: Every morning | ORAL | 0 refills | Status: DC

## 2019-11-04 MED FILL — VIT D2 1.25 MG (50,000 UNIT: 1.25 MG | 28 days supply | Qty: 4 | Fill #0

## 2019-11-04 MED FILL — METFORMIN HCL 500 MG TABS: 500 | 30 days supply | Qty: 30 | Fill #0

## 2019-11-04 NOTE — Progress Notes (Signed)
Chief Complaint:   OBESITY [name] [name] is here to discuss her progress with her obesity treatment plan along with follow-up of her obesity related diagnoses. [name] [name] is on the Category 2 Plan and states she is following her eating plan approximately 85% of the time. [name] [name] states she is doing cardio/resistance 45 minutes 5 times per week.  Today's visit was #: 8 Starting weight: 243 lbs Starting date: 06/18/2019 Today's weight: 226 lbs Today's date: 11/04/2019 Total lbs lost to date: 17 Total lbs lost since last in-office visit: 3  Interim History: [name] [name] is typically not very hungry in the morning and will have a Premier Protein shake at breakfast. Lunch and dinner are easier to follow. She reports increased energy and denies polyphagia. Her ultimate goal is to lose under 200 lbs.  Subjective:   Vitamin D deficiency. [name] [name] is on prescription strength Vitamin D supplementation and tolerating it well. She denies nausea/vomiting/muscle weakness.   Ref. Range 10/14/2019 10:50  Vitamin D, 25-Hydroxy Latest Ref Range: 30.0 - 100.0 ng/mL 43.7   Type 2 diabetes mellitus without complication, without long-term current use of insulin ([place]). A1c is greatly improved at 5.6 on 10/14/2019. [name] [name] is on metformin 500 mg daily and tolerating it well.   Lab Results  Component Value Date   HGBA1C 5.6 10/14/2019   HGBA1C 5.7 (H) 06/18/2019   HGBA1C 5.3 01/08/2019   Lab Results  Component Value Date   MICROALBUR 30 01/09/2017   LDLCALC 121 (H) 10/14/2019   CREATININE 1.05 (H) 09/30/2019   Lab Results  Component Value Date   INSULIN 11.9 10/14/2019   INSULIN 10.1 06/18/2019   Near syncope. Truda recently completed a 14-day Zio patch monitor. She denies current cardiac symptoms. She denies tobacco/vape use.  Hyperlipidemia associated with type 2 diabetes mellitus ([place]). [name] [name] has hyperlipidemia and has been trying to improve her cholesterol levels with intensive lifestyle  modification including a low saturated fat diet, exercise and weight loss. She denies any chest pain, claudication or myalgias. [name] [name] is not on a statin. She denies tobacco/vape use. She denies first degree family history of MI/CAD.  Lab Results  Component Value Date   ALT 10 07/20/2019   AST 13 07/20/2019   ALKPHOS 74 07/20/2019   BILITOT 0.4 07/20/2019   Lab Results  Component Value Date   CHOL 181 10/14/2019   HDL 44 10/14/2019   LDLCALC 121 (H) 10/14/2019   TRIG 85 10/14/2019   CHOLHDL 3.7 01/08/2019   At risk for heart disease. [name] [name] is at a higher than average risk for cardiovascular disease due to type II diabetes and hyperlipidemia.   Assessment/Plan:   Vitamin D deficiency. Low Vitamin D level contributes to fatigue and are associated with obesity, breast, and colon cancer. She was given a refill on her Vitamin D, Ergocalciferol, (DRISDOL) 1.25 MG (50000 UNIT) CAPS capsule every week #4 with 0 refills and will follow-up for routine testing of Vitamin D every 3 months.  Type 2 diabetes mellitus without complication, without long-term current use of insulin ([place]). Good blood sugar control is important to decrease the likelihood of diabetic complications such as nephropathy, neuropathy, limb loss, blindness, coronary artery disease, and death. Intensive lifestyle modification including diet, exercise and weight loss are the first line of treatment for diabetes. Refill was given for metFORMIN (GLUCOPHAGE) 500 MG tablet #30 with 0 refills. She will continue to follow the Category 2 meal plan.  Near syncope. [name] [name] will follow-up with her cardiologist as directed  and will remain well hydrated.  Hyperlipidemia associated with type 2 diabetes mellitus ([place]). Cardiovascular risk and specific lipid/LDL goals reviewed.  We discussed several lifestyle modifications today and [name] [name] will continue to work on diet, exercise and weight loss efforts. Orders and follow up as documented in  patient record. [name] [name] will continue to follow the Category 2 meal plan. If LDL is not improving at the next recheck in 3 months, will consider initiating statin therapy.  Counseling Intensive lifestyle modifications are the first line treatment for this issue.  Dietary changes: Increase soluble fiber. Decrease simple carbohydrates.  Exercise changes: Moderate to vigorous-intensity aerobic activity 150 minutes per week if tolerated.  Lipid-lowering medications: see documented in medical record.  At risk for heart disease. [name] [name] was given approximately 15 minutes of coronary artery disease prevention counseling today. She is 39 y.o. female and has risk factors for heart disease including obesity. We discussed intensive lifestyle modifications today with an emphasis on specific weight loss instructions and strategies.   Repetitive spaced learning was employed today to elicit superior memory formation and behavioral change.  Class 1 obesity with serious comorbidity and body mass index (BMI) of 34.0 to 34.9 in adult, unspecified obesity type.  [name] [name] is currently in the action stage of change. As such, her goal is to continue with weight loss efforts. She has agreed to the Category 2 Plan.   Handout was provided on Additional Breakfast Options.  Exercise goals: [name] [name] will continue her current exercise regimen.  Behavioral modification strategies: increasing lean protein intake, meal planning and cooking strategies and planning for success.  [name] [name] has agreed to follow-up with our clinic in 2 weeks. She was informed of the importance of frequent follow-up visits to maximize her success with intensive lifestyle modifications for her multiple health conditions.   Objective:   Blood pressure 103/68, pulse (!) 55, temperature 98.1 F (36.7 C), temperature source Oral, height 5\' 8"  (1.727 m), weight 226 lb (102.5 kg), SpO2 99 %. Body mass index is 34.36 kg/m.  General: Cooperative,  alert, well developed, in no acute distress. HEENT: Conjunctivae and lids unremarkable. Cardiovascular: Regular rhythm.  Lungs: Normal work of breathing. Neurologic: No focal deficits.   Lab Results  Component Value Date   CREATININE 1.05 (H) 09/30/2019   BUN 17 09/30/2019   NA 138 09/30/2019   K 4.0 09/30/2019   CL 106 09/30/2019   CO2 24 09/30/2019   Lab Results  Component Value Date   ALT 10 07/20/2019   AST 13 07/20/2019   ALKPHOS 74 07/20/2019   BILITOT 0.4 07/20/2019   Lab Results  Component Value Date   HGBA1C 5.6 10/14/2019   HGBA1C 5.7 (H) 06/18/2019   HGBA1C 5.3 01/08/2019   HGBA1C 5.5 05/30/2017   HGBA1C 6.4 01/09/2017   Lab Results  Component Value Date   INSULIN 11.9 10/14/2019   INSULIN 10.1 06/18/2019   Lab Results  Component Value Date   TSH 0.764 09/30/2019   Lab Results  Component Value Date   CHOL 181 10/14/2019   HDL 44 10/14/2019   LDLCALC 121 (H) 10/14/2019   TRIG 85 10/14/2019   CHOLHDL 3.7 01/08/2019   Lab Results  Component Value Date   WBC 6.9 09/30/2019   HGB 13.4 09/30/2019   HCT 39.8 09/30/2019   MCV 89.2 09/30/2019   PLT 268 09/30/2019   Lab Results  Component Value Date   FERRITIN 9 (L) 03/14/2016   Attestation Statements:   Reviewed by clinician on day of  visit: allergies, medications, problem list, medical history, surgical history, family history, social history, and previous encounter notes.  I, Michaelene Song, am acting as Location manager for [employer], NP-C   I have reviewed the above documentation for accuracy and completeness, and I agree with the above. -  Esaw Grandchild, NP

## 2019-11-18 ENCOUNTER — Encounter (INDEPENDENT_AMBULATORY_CARE_PROVIDER_SITE_OTHER): Payer: Self-pay | Admitting: Adult Health

## 2019-11-18 ENCOUNTER — Other Ambulatory Visit: Payer: Self-pay

## 2019-11-18 ENCOUNTER — Ambulatory Visit (INDEPENDENT_AMBULATORY_CARE_PROVIDER_SITE_OTHER): Payer: No Typology Code available for payment source | Admitting: Adult Health

## 2019-11-18 VITALS — BP 97/66 | HR 59 | Temp 97.7°F | Ht 68.0 in | Wt 226.8 lb

## 2019-11-18 DIAGNOSIS — Z6834 Body mass index (BMI) 34.0-34.9, adult: Secondary | ICD-10-CM

## 2019-11-18 DIAGNOSIS — E118 Type 2 diabetes mellitus with unspecified complications: Secondary | ICD-10-CM | POA: Diagnosis not present

## 2019-11-18 DIAGNOSIS — E669 Obesity, unspecified: Secondary | ICD-10-CM

## 2019-11-18 DIAGNOSIS — E559 Vitamin D deficiency, unspecified: Secondary | ICD-10-CM | POA: Diagnosis not present

## 2019-11-18 NOTE — Progress Notes (Signed)
Chief Complaint:   OBESITY [name] [name] is here to discuss her progress with her obesity treatment plan along with follow-up of her obesity related diagnoses. [name] [name] is on the Category 2 Plan and states she is following her eating plan approximately 90% of the time. [name] [name] states she is doing cardio/weights 45 minutes 3 times per week.  Today's visit was #: 9 Starting weight: 243 lbs Starting date: 06/18/2019 Today's weight: 226 lbs Today's date: 11/18/2019 Total lbs lost to date: 17 Total lbs lost since last in-office visit: 0  Interim History: [name] [name] continues to enjoy structure of the Category 2 meal plan and denies polyphagia or excessive cravings. She was able to stay on track over Independence Day and her birthday, by focusing on protein and vegetables and avoiding carbohydrates. She reports life stressors, to include: selling her home and looking for a new place to live with her son. She continues to experience intermittent palpitations and is awaiting Holter monitor results and follow-up with her cardiologist.  Subjective:   Type 2 diabetes with complication ([place]). [name] [name] is on metformin 500 mg daily, which she is tolerating well. She denies polyphagia.   Lab Results  Component Value Date   HGBA1C 5.6 10/14/2019   HGBA1C 5.7 (H) 06/18/2019   HGBA1C 5.3 01/08/2019   Lab Results  Component Value Date   MICROALBUR 30 01/09/2017   LDLCALC 121 (H) 10/14/2019   CREATININE 1.05 (H) 09/30/2019   Lab Results  Component Value Date   INSULIN 11.9 10/14/2019   INSULIN 10.1 06/18/2019   Vitamin D deficiency. [name] [name] is on prescription strength Vitamin D supplementation and denies nausea, vomiting, or muscle weakness.    Ref. Range 10/14/2019 10:50  Vitamin D, 25-Hydroxy Latest Ref Range: 30.0 - 100.0 ng/mL 43.7   Assessment/Plan:   Type 2 diabetes with complication ([place]). Good blood sugar control is important to decrease the likelihood of diabetic complications such  as nephropathy, neuropathy, limb loss, blindness, coronary artery disease, and death. Intensive lifestyle modification including diet, exercise and weight loss are the first line of treatment for diabetes. [name] [name] will continue metformin as directed (no medication refill today). She will have labs checked at her next office visit.  Vitamin D deficiency. Low Vitamin D level contributes to fatigue and are associated with obesity, breast, and colon cancer. She agrees to continue to take prescription Vitamin D as directed (no medication refill today) and will follow-up for routine testing of Vitamin D at her next office visit.  Class 1 obesity with serious comorbidity and body mass index (BMI) of 34.0 to 34.9 in adult, unspecified obesity type.  [name] [name] is currently in the action stage of change. As such, her goal is to continue with weight loss efforts. She has agreed to the Category 2 Plan.   Exercise goals: [name] [name] will continue her current exercise regimen and will work with a Physiological scientist.  Behavioral modification strategies: increasing lean protein intake, increasing water intake, meal planning and cooking strategies, holiday eating strategies  and celebration eating strategies.  [name] [name] has agreed to follow-up with our clinic in 2 weeks. She was informed of the importance of frequent follow-up visits to maximize her success with intensive lifestyle modifications for her multiple health conditions.   Objective:   Blood pressure 97/66, pulse (!) 59, temperature 97.7 F (36.5 C), temperature source Oral, height 5\' 8"  (1.727 m), weight 226 lb 12.8 oz (102.9 kg), SpO2 99 %. Body mass index is 34.48 kg/m.  General: Cooperative, alert,  well developed, in no acute distress. HEENT: Conjunctivae and lids unremarkable. Cardiovascular: Regular rhythm.  Lungs: Normal work of breathing. Neurologic: No focal deficits.   Lab Results  Component Value Date   CREATININE 1.05 (H) 09/30/2019   BUN 17  09/30/2019   NA 138 09/30/2019   K 4.0 09/30/2019   CL 106 09/30/2019   CO2 24 09/30/2019   Lab Results  Component Value Date   ALT 10 07/20/2019   AST 13 07/20/2019   ALKPHOS 74 07/20/2019   BILITOT 0.4 07/20/2019   Lab Results  Component Value Date   HGBA1C 5.6 10/14/2019   HGBA1C 5.7 (H) 06/18/2019   HGBA1C 5.3 01/08/2019   HGBA1C 5.5 05/30/2017   HGBA1C 6.4 01/09/2017   Lab Results  Component Value Date   INSULIN 11.9 10/14/2019   INSULIN 10.1 06/18/2019   Lab Results  Component Value Date   TSH 0.764 09/30/2019   Lab Results  Component Value Date   CHOL 181 10/14/2019   HDL 44 10/14/2019   LDLCALC 121 (H) 10/14/2019   TRIG 85 10/14/2019   CHOLHDL 3.7 01/08/2019   Lab Results  Component Value Date   WBC 6.9 09/30/2019   HGB 13.4 09/30/2019   HCT 39.8 09/30/2019   MCV 89.2 09/30/2019   PLT 268 09/30/2019   Lab Results  Component Value Date   FERRITIN 9 (L) 03/14/2016   Attestation Statements:   Reviewed by clinician on day of visit: allergies, medications, problem list, medical history, surgical history, family history, social history, and previous encounter notes.  Time spent on visit including pre-visit chart review and post-visit charting and care was 27 minutes.   I, Michaelene Song, am acting as Location manager for [employer], NP-C   I have reviewed the above documentation for accuracy and completeness, and I agree with the above. -  Esaw Grandchild, NP

## 2019-11-23 ENCOUNTER — Telehealth: Payer: Self-pay | Admitting: Cardiology

## 2019-11-23 NOTE — Telephone Encounter (Signed)
Patient states over the weekend and today . Heart rate has been fluctuating  From 43 to 49 . She state as it went up to 123- Yesterday - hot and sweaty .  She has no way of checking her blood pressure. She [facility] blood pressure  97/60(last Wednesday) .  She states she is drinking @ 64-72 oz  A daa. She doe not have a fever.    Patient recent finished a 14 day Zio monitor. She states she turned it in @ week ago  Patient aware will defer to Dr Harrell Gave

## 2019-11-23 NOTE — Telephone Encounter (Signed)
STAT if patient feels like he/she is going to faint   1) Are you dizzy now? Yes   2) Do you feel faint or have you passed out? Has not passed out, but is feeling faint   3) Do you have any other symptoms? No   4) Have you checked your HR and BP (record if available)? HR 49

## 2019-11-24 NOTE — Telephone Encounter (Signed)
Patient calling back to add that she also has an intense headache upon standing in addition to the dizziness. Please address

## 2019-11-26 ENCOUNTER — Telehealth: Payer: Self-pay | Admitting: Obstetrics and Gynecology

## 2019-11-26 NOTE — Telephone Encounter (Signed)
Spoke with patient. Patient reports fishy vaginal odor, itching, yellow/grey vaginal d/c and pelvic discomfort that started 2 days ago. Requesting OV with first available provider. Denies urinary symptoms, fever/chills, N/V or irregular bleeding.   OV scheduled for 7/23 at 3pm with Dr. Sabra Heck.   Routing to provider for final review. Patient is agreeable to disposition. Will close encounter.

## 2019-11-26 NOTE — Progress Notes (Signed)
GYNECOLOGY  VISIT  CC:   Vaginal odor and itching  HPI: 39 y.o. [id number] Married White or Caucasian female here for vaginal odor & itching.  Denies vaginal bleeding.  Denies dysuria, urinary frequency or urinary urgency.  She has noticed some cloudiness to urine.    GYNECOLOGIC HISTORY: No LMP recorded. (Menstrual status: IUD). Contraception: mirena iud placed 12/17 Menopausal hormone therapy: none  Patient Active Problem List   Diagnosis Date Noted  . Near syncope 11/04/2019  . Hyperlipidemia associated with type 2 diabetes mellitus ([place]) 11/04/2019  . Prediabetes 10/15/2019  . Vitamin D deficiency 08/06/2019  . Constipation 08/06/2019  . Endometriosis   . Candidiasis, cutaneous 07/14/2018  . Flu-like symptoms 07/09/2018  . Pharyngitis 07/09/2018  . GAD (generalized anxiety disorder) 06/02/2018  . Dysuria 03/27/2018  . Family history of Parkinsonism 03/27/2018  . Dizziness 03/27/2018  . Bradycardia 03/23/2018  . HLD (hyperlipidemia) 03/23/2018  . Coronary atherosclerosis 03/23/2018  . Chest pain 03/22/2018  . Varicose vein of leg 12/24/2017  . Venous reflux 12/24/2017  . Leukocytosis 09/29/2017  . Nausea 09/29/2017  . Anxiety 09/29/2017  . Pancreatitis 09/28/2017  . Dehydration 03/12/2017  . 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 Sleep apnea 01/09/2017  . Healthcare maintenance 10/18/2016  . Type 2 diabetes with complication ([place]) 75/64/3329  . LOW BACK PAIN, ACUTE 11/20/2006  . POLYCYSTIC OVARY 07/04/2006  . OBESITY, NOS 07/04/2006  . TOBACCO USE, QUIT 07/04/2006    Past Medical History:  Diagnosis Date  . Abnormal uterine bleeding   . Amenorrhea   . Anemia   . Anxiety    panic attacks  . Back pain   . Bradycardia   . Chest pain   . Constipation   . Diabetes ([place])   . Dysmenorrhea   . Endometriosis   . Gallbladder problem   . GERD (gastroesophageal reflux disease)    occasional - diet controlled and tums prn  . HLD (hyperlipidemia)    . HTN (hypertension)   . IBS (irritable bowel syndrome)   . Infertility, female   . Insulin resistance   . Migraine without aura   . Missed abortion    x 2 - both resolved without surgery  . Obesity   . PCOS (polycystic ovarian syndrome)   . PCOS (polycystic ovarian syndrome)   . PONV (postoperative nausea and vomiting)    pe rpatient ; scopalamine patch very helpful   . Prediabetes   . Sleep apnea   . SVD (spontaneous vaginal delivery)    x 1  . Urinary incontinence   . Vitamin D deficiency     Past Surgical History:  Procedure Laterality Date  . CHOLECYSTECTOMY    . COLONOSCOPY  2017   polyps  . CYSTOSCOPY N/A 10/29/2016   Procedure: CYSTOSCOPY;  Surgeon: Salvadore Dom, MD;  Location: [facility];  Service: Gynecology;  Laterality: N/A;  . DILATATION & CURRETTAGE/HYSTEROSCOPY WITH RESECTOCOPE N/A 10/17/2012   Procedure: DILATATION & CURETTAGE/HYSTEROSCOPY WITH RESECTOCOPE; Possible Polypectomy, Possible Resectoscopic Myomectomy.;  Surgeon: Floyce Stakes. Pamala Hurry, MD;  Location: [facility];  Service: Gynecology;  Laterality: N/A;  1 hr.  Marland Kitchen DILATION AND CURETTAGE OF UTERUS    . ENDOMETRIAL BIOPSY    . ENDOVENOUS ABLATION SAPHENOUS VEIN W/ LASER Right 05/08/2018   endovenous laser ablation right greater saphenous vein and stab phlebectomy > 20 incisions right leg by Deitra Mayo MD   . ESOPHAGOGASTRODUODENOSCOPY ENDOSCOPY  2017   Normal  . HYSTEROSCOPY WITH D & C N/A  12/01/2013   Procedure: IUD Removal;  Surgeon: Floyce Stakes. Pamala Hurry, MD;  Location: [facility];  Service: Gynecology;  Laterality: N/A;  90 min.  . INTRAUTERINE DEVICE (IUD) INSERTION N/A 10/17/2012   Procedure: INTRAUTERINE DEVICE (IUD) INSERTION; Mirena;  Surgeon: Floyce Stakes. Pamala Hurry, MD;  Location: [facility];  Service: Gynecology;  Laterality: N/A;  . LAPAROSCOPIC GASTRIC SLEEVE RESECTION N/A 02/19/2017   Procedure: LAPAROSCOPIC GASTRIC SLEEVE RESECTION, UPPER ENDOSCOPY;  Surgeon: Johnathan Hausen, MD;  Location: [facility];   Service: General;  Laterality: N/A;  . LAPAROSCOPIC SALPINGO OOPHERECTOMY Left 10/29/2016   Procedure: LAPAROSCOPIC SALPINGO OOPHORECTOMY With anterior uterine biopsy;  Surgeon: Salvadore Dom, MD;  Location: [facility];  Service: Gynecology;  Laterality: Left;  . LASIK  10/06/2018  . MOUTH SURGERY     wisdom teeth    MEDS:   Current Outpatient Medications on File Prior to Visit  Medication Sig Dispense Refill  . APPLE CIDER VINEGAR PO Take 1 tablet by mouth daily.    . Biotin w/ Vitamins C & E (HAIR/SKIN/NAILS PO) Take 1 tablet by mouth daily.     . Chromium Picolinate 200 MCG CAPS Take 1 capsule by mouth daily.    . Coenzyme Q10 (COQ10) 100 MG CAPS Take 1 capsule by mouth daily.    . cyclobenzaprine (FLEXERIL) 10 MG tablet Take 1 tablet (10 mg total) by mouth 3 (three) times daily as needed for muscle spasms. 30 tablet 0  . diazepam (VALIUM) 5 MG tablet Take 1 tablet (5 mg total) by mouth every 8 (eight) hours as needed for anxiety. 60 tablet 0  . Docosahexaenoic Acid (DHA COMPLETE PO) Take 1 tablet by mouth daily.    . IRON PO Take 45 mg by mouth every other day.    . levonorgestrel (MIRENA) 20 MCG/24HR IUD 1 each by Intrauterine route once.    . loratadine (CLARITIN) 10 MG tablet Take 10 mg by mouth daily as needed for allergies.    . metFORMIN (GLUCOPHAGE) 500 MG tablet Take 1 tablet (500 mg total) by mouth every morning. 30 tablet 0  . Multiple Vitamins-Minerals (BARIATRIC MULTIVITAMINS/IRON PO) Take 1 tablet by mouth in the morning and at bedtime.    Marland Kitchen nystatin cream (MYCOSTATIN) Apply 1 application topically 2 (two) times daily. Apply to affected area BID for up to 7 days. (Patient taking differently: Apply 1 application topically 2 (two) times daily as needed for dry skin. ) 30 g 1  . ondansetron (ZOFRAN) 8 MG tablet Take 1 tablet (8 mg total) by mouth every 8 (eight) hours as needed for nausea or vomiting. 20 tablet 0  . Probiotic Product (PROBIOTIC ADVANCED PO) Take 1 capsule by  mouth daily.     Marland Kitchen Spirulina 500 MG TABS Take 1 tablet by mouth daily.    . Vitamin D, Ergocalciferol, (DRISDOL) 1.25 MG (50000 UNIT) CAPS capsule Take 1 capsule (50,000 Units total) by mouth every 7 (seven) days. 4 capsule 0  . ascorbic acid (VITAMIN C) 500 MG tablet Take 500 mg by mouth daily.     No current facility-administered medications on file prior to visit.    ALLERGIES: Iohexol and Pyridium [phenazopyridine hcl]  Family History  Problem Relation Age of Onset  . Diabetes Father   . Thyroid disease Father   . Hypertension Father   . Parkinson's disease Father   . Dementia Father   . Hyperlipidemia Father   . Heart disease Father   . Sleep apnea Father   . Obesity Father   .  Diabetes Maternal Grandmother   . Heart disease Maternal Grandmother   . Diabetes Maternal Grandfather   . Heart disease Maternal Grandfather   . Stroke Maternal Grandfather   . Diabetes Paternal Grandmother   . Diabetes Paternal Grandfather   . Heart disease Paternal Grandfather   . Alcohol abuse Paternal Grandfather   . Hypertension Mother   . Diabetes Mother   . Depression Mother   . Obesity Mother   . Colon cancer Maternal Uncle     SH:  Separated, non smoker  Review of Systems  Constitutional: Negative.   HENT: Negative.   Eyes: Negative.   Respiratory: Negative.   Cardiovascular: Negative.   Gastrointestinal: Negative.   Endocrine: Negative.   Genitourinary:       Vaginal odor & itching  Musculoskeletal: Negative.   Skin: Negative.   Allergic/Immunologic: Negative.   Neurological: Negative.   Hematological: Negative.   Psychiatric/Behavioral: Negative.     PHYSICAL EXAMINATION:    BP 120/78   Pulse 68   Resp 16   Wt (!) 225 lb (102.1 kg)   BMI 34.21 kg/m     General appearance: alert, cooperative and appears stated age Abdomen: soft, non-tender; bowel sounds normal; no masses,  no organomegaly Lymph:  no inguinal LAD noted  Pelvic: External genitalia:  no  lesions              Urethra:  normal appearing urethra with no masses, tenderness or lesions              Bartholins and Skenes: normal                 Vagina: normal appearing vagina with normal color and discharge, no lesions              Cervix: no lesions              Bimanual Exam:  Uterus:  normal size, contour, position, consistency, mobility, non-tender              Adnexa: no mass, fullness, tenderness  Chaperone, Royal Hawthorn, CMA, was present for exam.  Assessment: Vaginal discharge and odor Neg Gc/Chl 09/2019 +trichmonas testing 08/2019 with negative TOC 1 month later, pt now not concerned about risk as not SA Mirena IUD for contraception Mild dysuria  Plan: Affirm pending.  Rx for diflucan and metrogel given to pt.  Will see which is needed pending results Urine culture also pending.

## 2019-11-26 NOTE — Telephone Encounter (Signed)
Patient thinks she may have a yeast infection or bacteria infection. She would like an appointment tomorrow.

## 2019-11-27 ENCOUNTER — Other Ambulatory Visit: Payer: Self-pay

## 2019-11-27 ENCOUNTER — Ambulatory Visit (INDEPENDENT_AMBULATORY_CARE_PROVIDER_SITE_OTHER): Payer: No Typology Code available for payment source | Admitting: Obstetrics & Gynecology

## 2019-11-27 ENCOUNTER — Encounter: Payer: Self-pay | Admitting: Obstetrics & Gynecology

## 2019-11-27 VITALS — BP 120/78 | HR 68 | Resp 16 | Wt 225.0 lb

## 2019-11-27 DIAGNOSIS — N898 Other specified noninflammatory disorders of vagina: Secondary | ICD-10-CM

## 2019-11-27 DIAGNOSIS — R8290 Unspecified abnormal findings in urine: Secondary | ICD-10-CM | POA: Diagnosis not present

## 2019-11-27 MED ORDER — FLUCONAZOLE 150 MG PO TABS: ORAL_TABLET | ORAL | 0 refills | Status: DC

## 2019-11-27 MED ORDER — METRONIDAZOLE 500 MG PO TABS
500.0000 mg | ORAL_TABLET | Freq: Two times a day (BID) | ORAL | 0 refills | Status: DC
Start: 2019-11-27 — End: 2019-12-15

## 2019-11-27 MED FILL — METRONIDAZOLE 500 MG TABS: 500 | 7 days supply | Qty: 14 | Fill #0

## 2019-11-27 MED FILL — FLUCONAZOLE 150 MG TABS: 150 | 3 days supply | Qty: 2 | Fill #0

## 2019-11-28 LAB — VAGINITIS/VAGINOSIS, DNA PROBE
Candida Species: NEGATIVE
Gardnerella vaginalis: POSITIVE — AB
Trichomonas vaginosis: NEGATIVE

## 2019-11-29 LAB — URINE CULTURE: Organism ID, Bacteria: NO GROWTH

## 2019-12-01 NOTE — Telephone Encounter (Signed)
Monitor reviewed. There are no significant rhythm issues. Her symptoms (there were 91 triggered events in 12 days) are all sinus. This suggests that it is not an electrical issue causing her symptoms. Nothing high risk on her monitor.

## 2019-12-01 NOTE — Telephone Encounter (Signed)
Left message to call back  

## 2019-12-04 NOTE — Telephone Encounter (Signed)
Pt updated with monitor report and voiced she doesn't understand why she is still having symptoms of dizziness and HR will drop to 40's. Pt voiced she is getting frustrated and state no one seems to know what is happening.  Nurse offered virtual appointment with MD to voice concerns. Appointment agreeable and appointment scheduled for 8/10.

## 2019-12-09 ENCOUNTER — Other Ambulatory Visit (INDEPENDENT_AMBULATORY_CARE_PROVIDER_SITE_OTHER): Payer: Self-pay | Admitting: Adult Health

## 2019-12-09 ENCOUNTER — Ambulatory Visit (INDEPENDENT_AMBULATORY_CARE_PROVIDER_SITE_OTHER): Payer: No Typology Code available for payment source | Admitting: Adult Health

## 2019-12-09 ENCOUNTER — Other Ambulatory Visit: Payer: Self-pay

## 2019-12-09 ENCOUNTER — Encounter (INDEPENDENT_AMBULATORY_CARE_PROVIDER_SITE_OTHER): Payer: Self-pay | Admitting: Adult Health

## 2019-12-09 VITALS — BP 106/68 | HR 63 | Temp 98.2°F | Ht 68.0 in | Wt 227.0 lb

## 2019-12-09 DIAGNOSIS — E118 Type 2 diabetes mellitus with unspecified complications: Secondary | ICD-10-CM

## 2019-12-09 DIAGNOSIS — Z9189 Other specified personal risk factors, not elsewhere classified: Secondary | ICD-10-CM | POA: Diagnosis not present

## 2019-12-09 DIAGNOSIS — Z6834 Body mass index (BMI) 34.0-34.9, adult: Secondary | ICD-10-CM

## 2019-12-09 DIAGNOSIS — E559 Vitamin D deficiency, unspecified: Secondary | ICD-10-CM

## 2019-12-09 DIAGNOSIS — E1169 Type 2 diabetes mellitus with other specified complication: Secondary | ICD-10-CM | POA: Diagnosis not present

## 2019-12-09 DIAGNOSIS — E785 Hyperlipidemia, unspecified: Secondary | ICD-10-CM

## 2019-12-09 DIAGNOSIS — E669 Obesity, unspecified: Secondary | ICD-10-CM

## 2019-12-09 MED ORDER — VITAMIN D (ERGOCALCIFEROL) 1.25 MG (50000 UNIT) PO CAPS: 50000.0000 [IU] | ORAL_CAPSULE | ORAL | 0 refills | Status: DC

## 2019-12-09 MED FILL — VIT D2 1.25 MG (50,000 UNIT: 1.25 MG | 4 days supply | Qty: 4 | Fill #0

## 2019-12-10 NOTE — Progress Notes (Signed)
Chief Complaint:   OBESITY [name] [name] is here to discuss her progress with her obesity treatment plan along with follow-up of her obesity related diagnoses. [name] is on the Category 2 Plan and states she is following her eating plan approximately 0% of the time. [name] states she is exercising 0 minutes 0 times per week.  Today's visit was #: 10 Starting weight: 243 lbs Starting date: 06/18/2019 Today's weight: 227 lbs Today's date: 12/09/2019 Total lbs lost to date: 16 Total lbs lost since last in-office visit: 0  Interim History: In the last few weeks, [name] has been preparing her home to be sold, her father had a health setback, and she continues to deal with divorce proceedings. She reports consistently eating off plan the last week, however, she has been quite physically active with house prep/cleaning.  Subjective:   Vitamin D deficiency. [name] is on prescription strength Vitamin D supplementation and denies nausea, vomiting, or muscle weakness.    Ref. Range 10/14/2019 10:50  Vitamin D, 25-Hydroxy Latest Ref Range: 30.0 - 100.0 ng/mL 43.7   Type 2 diabetes with complication ([place]). [name] is on metformin 500 mg daily and denies GI upset or polyphagia.  Lab Results  Component Value Date   HGBA1C 5.6 10/14/2019   HGBA1C 5.7 (H) 06/18/2019   HGBA1C 5.3 01/08/2019   Lab Results  Component Value Date   MICROALBUR 30 01/09/2017   LDLCALC 121 (H) 10/14/2019   CREATININE 1.05 (H) 09/30/2019   Lab Results  Component Value Date   INSULIN 11.9 10/14/2019   INSULIN 10.1 06/18/2019   Hyperlipidemia associated with type 2 diabetes mellitus ([place]). [name] has hyperlipidemia and has been trying to improve her cholesterol levels with intensive lifestyle modification including a low saturated fat diet, exercise and weight loss. She denies any chest pain, claudication or myalgias. LDL is not at goal. She is not on statin therapy.  Lab Results  Component Value Date    ALT 10 07/20/2019   AST 13 07/20/2019   ALKPHOS 74 07/20/2019   BILITOT 0.4 07/20/2019   Lab Results  Component Value Date   CHOL 181 10/14/2019   HDL 44 10/14/2019   LDLCALC 121 (H) 10/14/2019   TRIG 85 10/14/2019   CHOLHDL 3.7 01/08/2019   At risk for heart disease. [name] is at a higher than average risk for cardiovascular disease due to hyperlipidemia, type II diabetes, and obesity.   Assessment/Plan:   Vitamin D deficiency. Low Vitamin D level contributes to fatigue and are associated with obesity, breast, and colon cancer. She was given a refill on her Vitamin D, Ergocalciferol, (DRISDOL) 1.25 MG (50000 UNIT) CAPS capsule every week #4 with 0 refills and will follow-up for routine testing of Vitamin D in mid September 2021.   Type 2 diabetes with complication ([place]). Good blood sugar control is important to decrease the likelihood of diabetic complications such as nephropathy, neuropathy, limb loss, blindness, coronary artery disease, and death. Intensive lifestyle modification including diet, exercise and weight loss are the first line of treatment for diabetes. Lenzie will continue metformin as directed and will continue to follow the Category 2 meal plan. Labs will be checked in September 2021.  Hyperlipidemia associated with type 2 diabetes mellitus ([place]). Cardiovascular risk and specific lipid/LDL goals reviewed.  We discussed several lifestyle modifications today and [name] will continue to work on diet, exercise and weight loss efforts. Orders and follow up as documented in patient record. [name] was instructed to continue to  limit saturated fat and avoid tobacco.  Labs will be checked in mid September 2021.  Counseling Intensive lifestyle modifications are the first line treatment for this issue. . Dietary changes: Increase soluble fiber. Decrease simple carbohydrates. . Exercise changes: Moderate to vigorous-intensity aerobic activity 150 minutes per week if  tolerated. . Lipid-lowering medications: see documented in medical record.  At risk for heart disease. Makesha was given approximately 15 minutes of coronary artery disease prevention counseling today. She is 39 y.o. female and has risk factors for heart disease including obesity. We discussed intensive lifestyle modifications today with an emphasis on specific weight loss instructions and strategies.   Repetitive spaced learning was employed today to elicit superior memory formation and behavioral change.  Class 1 obesity with serious comorbidity and body mass index (BMI) of 34.0 to 34.9 in adult, unspecified obesity type.  [name] is currently in the action stage of change. As such, her goal is to continue with weight loss efforts. She has agreed to the Category 2 Plan.   Exercise goals: No exercise has been prescribed at this time.  Behavioral modification strategies: increasing lean protein intake, meal planning and cooking strategies and planning for success.  [name] has agreed to follow-up with our clinic in 3 weeks. She was informed of the importance of frequent follow-up visits to maximize her success with intensive lifestyle modifications for her multiple health conditions.   Objective:   Blood pressure 106/68, pulse 63, temperature 98.2 F (36.8 C), temperature source Oral, height 5\' 8"  (1.727 m), weight 227 lb (103 kg), SpO2 98 %. Body mass index is 34.52 kg/m.  General: Cooperative, alert, well developed, in no acute distress. HEENT: Conjunctivae and lids unremarkable. Cardiovascular: Regular rhythm.  Lungs: Normal work of breathing. Neurologic: No focal deficits.   Lab Results  Component Value Date   CREATININE 1.05 (H) 09/30/2019   BUN 17 09/30/2019   NA 138 09/30/2019   K 4.0 09/30/2019   CL 106 09/30/2019   CO2 24 09/30/2019   Lab Results  Component Value Date   ALT 10 07/20/2019   AST 13 07/20/2019   ALKPHOS 74 07/20/2019   BILITOT 0.4 07/20/2019   Lab  Results  Component Value Date   HGBA1C 5.6 10/14/2019   HGBA1C 5.7 (H) 06/18/2019   HGBA1C 5.3 01/08/2019   HGBA1C 5.5 05/30/2017   HGBA1C 6.4 01/09/2017   Lab Results  Component Value Date   INSULIN 11.9 10/14/2019   INSULIN 10.1 06/18/2019   Lab Results  Component Value Date   TSH 0.764 09/30/2019   Lab Results  Component Value Date   CHOL 181 10/14/2019   HDL 44 10/14/2019   LDLCALC 121 (H) 10/14/2019   TRIG 85 10/14/2019   CHOLHDL 3.7 01/08/2019   Lab Results  Component Value Date   WBC 6.9 09/30/2019   HGB 13.4 09/30/2019   HCT 39.8 09/30/2019   MCV 89.2 09/30/2019   PLT 268 09/30/2019   Lab Results  Component Value Date   FERRITIN 9 (L) 03/14/2016   Attestation Statements:   Reviewed by clinician on day of visit: allergies, medications, problem list, medical history, surgical history, family history, social history, and previous encounter notes.  I, Michaelene Song, am acting as Location manager for [employer], NP-C   I have reviewed the above documentation for accuracy and completeness, and I agree with the above. -  Esaw Grandchild, NP

## 2019-12-14 ENCOUNTER — Encounter: Payer: Self-pay | Admitting: Cardiology

## 2019-12-15 ENCOUNTER — Telehealth (INDEPENDENT_AMBULATORY_CARE_PROVIDER_SITE_OTHER): Payer: No Typology Code available for payment source | Admitting: Cardiology

## 2019-12-15 ENCOUNTER — Encounter: Payer: Self-pay | Admitting: Cardiology

## 2019-12-15 VITALS — BP 106/70 | HR 52 | Ht 68.0 in | Wt 224.0 lb

## 2019-12-15 DIAGNOSIS — R002 Palpitations: Secondary | ICD-10-CM | POA: Diagnosis not present

## 2019-12-15 DIAGNOSIS — Z7189 Other specified counseling: Secondary | ICD-10-CM

## 2019-12-15 DIAGNOSIS — R001 Bradycardia, unspecified: Secondary | ICD-10-CM

## 2019-12-15 DIAGNOSIS — R55 Syncope and collapse: Secondary | ICD-10-CM | POA: Diagnosis not present

## 2019-12-15 DIAGNOSIS — Z8249 Family history of ischemic heart disease and other diseases of the circulatory system: Secondary | ICD-10-CM | POA: Diagnosis not present

## 2019-12-15 NOTE — Patient Instructions (Signed)
Medication Instructions:  None Changes *If you need a refill on your cardiac medications before your next appointment, please call your pharmacy*   Lab Work: None ordered  If you have labs (blood work) drawn today and your tests are completely normal, you will receive your results only by: Marland Kitchen MyChart Message (if you have MyChart) OR . A paper copy in the mail If you have any lab test that is abnormal or we need to change your treatment, we will call you to review the results.   Testing/Procedures: None Ordered   Follow-Up: At [facility], you and your health needs are our priority.  As part of our continuing mission to provide you with exceptional heart care, we have created designated Provider Care Teams.  These Care Teams include your primary Cardiologist (physician) and Advanced Practice Providers (APPs -  Physician Assistants and Nurse Practitioners) who all work together to provide you with the care you need, when you need it.  We recommend signing up for the patient portal called "MyChart".  Sign up information is provided on this After Visit Summary.  MyChart is used to connect with patients for Virtual Visits (Telemedicine).  Patients are able to view lab/test results, encounter notes, upcoming appointments, etc.  Non-urgent messages can be sent to your provider as well.   To learn more about what you can do with MyChart, go to [email or web address].    Your next appointment:   Can come in as needed   The format for your next appointment:   In Person  Provider:   Buford Dresser, MD   Other Instructions

## 2019-12-15 NOTE — Progress Notes (Signed)
Virtual Visit via Telephone Note   This visit type was conducted due to national recommendations for restrictions regarding the COVID-19 Pandemic (e.g. social distancing) in an effort to limit this patient's exposure and mitigate transmission in our community.  Due to her co-morbid illnesses, this patient is at least at moderate risk for complications without adequate follow up.  This format is felt to be most appropriate for this patient at this time.  The patient did not have access to video technology/had technical difficulties with video requiring transitioning to audio format only (telephone).  All issues noted in this document were discussed and addressed.  No physical exam could be performed with this format.  Please refer to the patient's chart for her  consent to telehealth for [facility].   The patient was identified using 2 identifiers.  Patient Location: Home Provider Location: Home Office  Date:  12/15/2019   ID:  [name] [name], DOB 07-Jan-1981, MRN [id number]  PCP:  Patient, No Pcp Per  Cardiologist:  Buford Dresser, MD   CC: follow up of monitor results  History of Present Illness:    [name] [name] is a 39 y.o. female with a hx of PCOS, prior bariatric surgery, former tobacco use who is seen for follow up of monitor results.   Today: Still having dizzy spells and low heart rate. We reviewed her monitor report at length today. Excellent heart rate variability, with sleep heart rates in the 40s. Symptomatic events were sinus. Can augment heart rate appropriately with activity.  We discussed her symptoms at length. Does not appear to be an electrical issue. Discussed how vagal tone controls blood pressure and heart rate. Discussed recommendations to see if she feels improved with conservative measures, see below.  Denies chest pain, shortness of breath at rest or with normal exertion. No PND, orthopnea, LE edema or unexpected weight gain.   Past Medical  History:  Diagnosis Date  . Abnormal uterine bleeding   . Amenorrhea   . Anemia   . Anxiety    panic attacks  . Back pain   . Bradycardia   . Chest pain   . Constipation   . Diabetes ([place])   . Dysmenorrhea   . Endometriosis   . Gallbladder problem   . GERD (gastroesophageal reflux disease)    occasional - diet controlled and tums prn  . HLD (hyperlipidemia)   . HTN (hypertension)   . IBS (irritable bowel syndrome)   . Infertility, female   . Insulin resistance   . Migraine without aura   . Missed abortion    x 2 - both resolved without surgery  . Obesity   . PCOS (polycystic ovarian syndrome)   . PCOS (polycystic ovarian syndrome)   . PONV (postoperative nausea and vomiting)    pe rpatient ; scopalamine patch very helpful   . Prediabetes   . Sleep apnea   . SVD (spontaneous vaginal delivery)    x 1  . Urinary incontinence   . Vitamin D deficiency     Past Surgical History:  Procedure Laterality Date  . CHOLECYSTECTOMY    . COLONOSCOPY  2017   polyps  . CYSTOSCOPY N/A 10/29/2016   Procedure: CYSTOSCOPY;  Surgeon: Salvadore Dom, MD;  Location: [facility];  Service: Gynecology;  Laterality: N/A;  . DILATATION & CURRETTAGE/HYSTEROSCOPY WITH RESECTOCOPE N/A 10/17/2012   Procedure: DILATATION & CURETTAGE/HYSTEROSCOPY WITH RESECTOCOPE; Possible Polypectomy, Possible Resectoscopic Myomectomy.;  Surgeon: Floyce Stakes. Pamala Hurry, MD;  Location: [facility];  Service: Gynecology;  Laterality: N/A;  1 hr.  Marland Kitchen DILATION AND CURETTAGE OF UTERUS    . ENDOMETRIAL BIOPSY    . ENDOVENOUS ABLATION SAPHENOUS VEIN W/ LASER Right 05/08/2018   endovenous laser ablation right greater saphenous vein and stab phlebectomy > 20 incisions right leg by Deitra Mayo MD   . ESOPHAGOGASTRODUODENOSCOPY ENDOSCOPY  2017   Normal  . HYSTEROSCOPY WITH D & C N/A 12/01/2013   Procedure: IUD Removal;  Surgeon: Floyce Stakes. Pamala Hurry, MD;  Location: [facility];  Service: Gynecology;  Laterality: N/A;  90 min.  .  INTRAUTERINE DEVICE (IUD) INSERTION N/A 10/17/2012   Procedure: INTRAUTERINE DEVICE (IUD) INSERTION; Mirena;  Surgeon: Floyce Stakes. Pamala Hurry, MD;  Location: [facility];  Service: Gynecology;  Laterality: N/A;  . LAPAROSCOPIC GASTRIC SLEEVE RESECTION N/A 02/19/2017   Procedure: LAPAROSCOPIC GASTRIC SLEEVE RESECTION, UPPER ENDOSCOPY;  Surgeon: Johnathan Hausen, MD;  Location: [facility];  Service: General;  Laterality: N/A;  . LAPAROSCOPIC SALPINGO OOPHERECTOMY Left 10/29/2016   Procedure: LAPAROSCOPIC SALPINGO OOPHORECTOMY With anterior uterine biopsy;  Surgeon: Salvadore Dom, MD;  Location: [facility];  Service: Gynecology;  Laterality: Left;  . LASIK  10/06/2018  . MOUTH SURGERY     wisdom teeth    Current Medications: Current Outpatient Medications on File Prior to Visit  Medication Sig  . APPLE CIDER VINEGAR PO Take 1 tablet by mouth daily.  Marland Kitchen ascorbic acid (VITAMIN C) 500 MG tablet Take 500 mg by mouth daily.  . Biotin w/ Vitamins C & E (HAIR/SKIN/NAILS PO) Take 1 tablet by mouth daily.   . Chromium Picolinate 200 MCG CAPS Take 1 capsule by mouth daily.  . Coenzyme Q10 (COQ10) 100 MG CAPS Take 1 capsule by mouth daily.  . cyclobenzaprine (FLEXERIL) 10 MG tablet Take 1 tablet (10 mg total) by mouth 3 (three) times daily as needed for muscle spasms.  . diazepam (VALIUM) 5 MG tablet Take 1 tablet (5 mg total) by mouth every 8 (eight) hours as needed for anxiety.  . Docosahexaenoic Acid (DHA COMPLETE PO) Take 1 tablet by mouth daily.  . IRON PO Take 45 mg by mouth every other day.  . levonorgestrel (MIRENA) 20 MCG/24HR IUD 1 each by Intrauterine route once.  . loratadine (CLARITIN) 10 MG tablet Take 10 mg by mouth daily as needed for allergies.  . metFORMIN (GLUCOPHAGE) 500 MG tablet Take 1 tablet (500 mg total) by mouth every morning.  . Multiple Vitamins-Minerals (BARIATRIC MULTIVITAMINS/IRON PO) Take 1 tablet by mouth in the morning and at bedtime.  Marland Kitchen nystatin cream (MYCOSTATIN) Apply 1 application  topically 2 (two) times daily. Apply to affected area BID for up to 7 days. (Patient taking differently: Apply 1 application topically 2 (two) times daily as needed for dry skin. )  . ondansetron (ZOFRAN) 8 MG tablet Take 1 tablet (8 mg total) by mouth every 8 (eight) hours as needed for nausea or vomiting.  . Probiotic Product (PROBIOTIC ADVANCED PO) Take 1 capsule by mouth daily.   Marland Kitchen Spirulina 500 MG TABS Take 1 tablet by mouth daily.  . Vitamin D, Ergocalciferol, (DRISDOL) 1.25 MG (50000 UNIT) CAPS capsule Take 1 capsule (50,000 Units total) by mouth every 7 (seven) days.   No current facility-administered medications on file prior to visit.     Allergies:   Iohexol and Pyridium [phenazopyridine hcl]   Social History   Tobacco Use  . Smoking status: Former Smoker    Packs/day: 0.25    Years: 19.00  Pack years: 4.75    Types: Cigarettes    Quit date: 06/06/2016    Years since quitting: 3.5  . Smokeless tobacco: Never Used  Vaping Use  . Vaping Use: Never used  Substance Use Topics  . Alcohol use: Yes    Comment: 1 a month  . Drug use: No    Family History: family history includes Alcohol abuse in her paternal grandfather; Colon cancer in her maternal uncle; Dementia in her father; Depression in her mother; Diabetes in her father, maternal grandfather, maternal grandmother, mother, paternal grandfather, and paternal grandmother; Heart disease in her father, maternal grandfather, maternal grandmother, and paternal grandfather; Hyperlipidemia in her father; Hypertension in her father and mother; Obesity in her father and mother; Parkinson's disease in her father; Sleep apnea in her father; Stroke in her maternal grandfather; Thyroid disease in her father. Family history: multiple family members with heart attacks and stents. Mat Gpa had bypass surgery and a pacemaker. Father has heart issues, had cath recently but no stent. Mother has mitral valve prolapse and palpitations. Gena Fray,  paternal great-gpa, and uncle all had MI.   ROS:   Please see the history of present illness.  Additional pertinent ROS otherwise unremarkable.  EKGs/Labs/Other Studies Reviewed:    The following studies were reviewed today: Monitor 04/21/18 NSR Rare PAC/PVC s  Symptoms do not correlate with arrhythmia Average HR 69 bpm  CT coronary 03/23/18 Coronary Arteries:  Normal coronary origin.  Right dominance.  RCA is a large dominant artery that gives rise to PDA and PLVB. There is no plaque.  Left main is a large artery that gives rise to LAD and LCX arteries. Left main has no plaque.  LAD is a large vessel that gives rise to two diagonal arteries. Proximal LAD has mild calcified plaque with associated stenosis 0-25%. Mid LAD has a mild non-calcified plaque with associated stenosis 0-25%.  LCX is a small non-dominant artery that gives rise to one small OM1 branch. There is no plaque.  Other findings:  Normal pulmonary vein drainage into the left atrium.  Normal let atrial appendage without a thrombus.  IMPRESSION: 1. Coronary calcium score of 46. This was 47 percentile for age and sex matched control.  2. Normal coronary origin with right dominance.  3. 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 Aggressive medical management is recommended.  4. Mildly dilated pulmonary artery measuring 32 mm suggestive of pulmonary hypertension.   EKG:  EKG is personally reviewed.  The ekg ordered today demonstrates sinus bradycardia at 47 bpm  Recent Labs: 07/20/2019: ALT 10 09/30/2019: BUN 17; Creatinine, Ser 1.05; Hemoglobin 13.4; Platelets 268; Potassium 4.0; Sodium 138; TSH 0.764  Recent Lipid Panel    Component Value Date/Time   CHOL 181 10/14/2019 1050   TRIG 85 10/14/2019 1050   HDL 44 10/14/2019 1050   CHOLHDL 3.7 01/08/2019 0847   CHOLHDL 3.5 03/23/2018 0623   VLDL 8 03/23/2018 0623   LDLCALC 121 (H) 10/14/2019 1050    Physical Exam:    VS:   BP 106/70   Pulse (!) 52   Ht 5\' 8"  (1.727 m)   Wt 224 lb (101.6 kg)   BMI 34.06 kg/m     Wt Readings from Last 3 Encounters:  12/15/19 224 lb (101.6 kg)  12/09/19 227 lb (103 kg)  11/27/19 (!) 225 lb (102.1 kg)    Speaking comfortably on the phone, no audible wheezing In no acute distress Alert and oriented Normal affect Normal  speech  ASSESSMENT:    1. Near syncope   2. Bradycardia   3. Heart palpitations   4. Family history of heart disease   5. Cardiac risk counseling   6. Counseling on health promotion and disease prevention    PLAN:    Near syncope, bradycardia, palpitations: -not orthostatic on vitals. Monitor without arrhythmia or conduction issues -given level of physical activity, this may be a normal vagal tone and heart rate response -we discussed results and management at length today.  We discussed managing this similar to how we manage orthostasis or autonomic dysfunction: -avoid dehydration. Often it requires high volumes of fluids, often with salt/electrolytes included, to stay hydrated. You may be very sensitive to fluid shifts and dehydration. Oral rehydration is preferred, and routine use of IV fluids is not recommended. -if tolerated, compression stocking can assist with fluid management and prevent pooling in the legs. -slow position changes are recommended -if there is a feeling of severe lightheadedness, like near to passing out, recommend lying on the floor on the back, with legs elevated up on a chair or up against the wall.  She will try these and see if they help.  Chest pain: prior reassuring CT coronary, has minimal nonobstructive CAD. Not suggestive of ischemia -counseled on red flag warning signs that need immediate medical attention  Cardiac risk counseling and prevention recommendations: especially with family history of heart disease -recommend heart healthy/Mediterranean diet, with whole grains, fruits, vegetable, fish, lean meats,  nuts, and olive oil. Limit salt. -recommend moderate walking, 3-5 times/week for 30-50 minutes each session. Aim for at least 150 minutes.week. Goal should be pace of 3 miles/hours, or walking 1.5 miles in 30 minutes -recommend avoidance of tobacco products. Avoid excess alcohol. -ASCVD risk score: The ASCVD Risk score Mikey Bussing DC Jr., et al., 2013) failed to calculate for the following reasons:   The 2013 ASCVD risk score is only valid for ages 76 to 80    Plan for follow up: as needed for symptoms, recommend every 2 years for prevention counseling given family history  Today, I have spent 19 minutes with the patient with telehealth technology discussing the above problems.  Additional time spent in chart review, documentation, and communication.  Buford Dresser, MD, PhD [place]  [facility]    Medication Adjustments/Labs and Tests Ordered: Current medicines are reviewed at length with the patient today.  Concerns regarding medicines are outlined above.  No orders of the defined types were placed in this encounter.  No orders of the defined types were placed in this encounter.   Patient Instructions  Medication Instructions:  None Changes *If you need a refill on your cardiac medications before your next appointment, please call your pharmacy*   Lab Work: None ordered  If you have labs (blood work) drawn today and your tests are completely normal, you will receive your results only by: Marland Kitchen MyChart Message (if you have MyChart) OR . A paper copy in the mail If you have any lab test that is abnormal or we need to change your treatment, we will call you to review the results.   Testing/Procedures: None Ordered   Follow-Up: At [facility], you and your health needs are our priority.  As part of our continuing mission to provide you with exceptional heart care, we have created designated Provider Care Teams.  These Care Teams include your primary Cardiologist  (physician) and Advanced Practice Providers (APPs -  Physician Assistants and Nurse Practitioners) who all  work together to provide you with the care you need, when you need it.  We recommend signing up for the patient portal called "MyChart".  Sign up information is provided on this After Visit Summary.  MyChart is used to connect with patients for Virtual Visits (Telemedicine).  Patients are able to view lab/test results, encounter notes, upcoming appointments, etc.  Non-urgent messages can be sent to your provider as well.   To learn more about what you can do with MyChart, go to [email or web address].    Your next appointment:   Can come in as needed   The format for your next appointment:   In Person  Provider:   Buford Dresser, MD   Other Instructions    Signed, Buford Dresser, MD PhD 12/15/2019  [place]

## 2019-12-23 ENCOUNTER — Other Ambulatory Visit: Payer: Self-pay | Admitting: Physician Assistant

## 2019-12-23 ENCOUNTER — Ambulatory Visit (INDEPENDENT_AMBULATORY_CARE_PROVIDER_SITE_OTHER): Payer: No Typology Code available for payment source | Admitting: Physician Assistant

## 2019-12-23 ENCOUNTER — Other Ambulatory Visit: Payer: Self-pay

## 2019-12-23 VITALS — Ht 68.0 in | Wt 227.0 lb

## 2019-12-23 DIAGNOSIS — R300 Dysuria: Secondary | ICD-10-CM

## 2019-12-23 DIAGNOSIS — R109 Unspecified abdominal pain: Secondary | ICD-10-CM

## 2019-12-23 DIAGNOSIS — R110 Nausea: Secondary | ICD-10-CM

## 2019-12-23 DIAGNOSIS — R112 Nausea with vomiting, unspecified: Secondary | ICD-10-CM

## 2019-12-23 LAB — POCT URINALYSIS DIPSTICK
Glucose, UA: NEGATIVE
Nitrite, UA: POSITIVE
Protein, UA: POSITIVE — AB
Spec Grav, UA: >=1.03 — AB (ref 1.010–1.025)
Urobilinogen, UA: 0.2 E.U./dL
pH, UA: 6 (ref 5.0–8.0)

## 2019-12-23 MED ORDER — CIPROFLOXACIN HCL 500 MG PO TABS: 500.0000 mg | ORAL_TABLET | Freq: Two times a day (BID) | ORAL | 0 refills | Status: DC

## 2019-12-23 MED FILL — CIPROFLOXACIN HCL 500 MG TA: 500 | 7 days supply | Qty: 14 | Fill #0

## 2019-12-23 NOTE — Progress Notes (Signed)
Pt complaining of  N/V, flank pain, and dysuria. UA positive for nitrites, leukocytes, and hematuria so will start empiric antibiotic with Cirpofloaxcin 500 mg BID x 7 days for concerns of possible pyelonephritis due to symptoms. Pending urine culture, will change antibiotic if indicated.   Lorrene Reid, PA-C

## 2019-12-26 LAB — URINE CULTURE

## 2020-01-04 ENCOUNTER — Ambulatory Visit (INDEPENDENT_AMBULATORY_CARE_PROVIDER_SITE_OTHER): Payer: No Typology Code available for payment source | Admitting: Adult Health

## 2020-01-07 ENCOUNTER — Telehealth: Payer: Self-pay | Admitting: Obstetrics and Gynecology

## 2020-01-07 NOTE — Telephone Encounter (Signed)
Spoke with patient. Patient reports gray, watery vaginal d/c with "fishy" odor that started on 8/31. States these are the same symptoms she had before when she was tx for BV. LAst seen in office on 7/23, tx with flagyl.   States she is currently being quarantined for Covid 19 exposure, quarantine started 9/1. Reports symptoms of scratchy throat, headache and stuffy nose.   Patient is requesting Rx. Patient is aware we typically don't treat these symptoms over the phone, that an OV would be recommended after Covid 19 quarantine has been completed and symptoms have resolved. Will review with Dr. Talbert Nan and return call.   Dr. Talbert Nan -please review and advise.

## 2020-01-07 NOTE — Telephone Encounter (Signed)
Patient "is sure that she has bacterial vaginosis again" and asked if a prescription could be called to [facility]. She is in quarantine from a covid exposure.

## 2020-01-07 NOTE — Telephone Encounter (Signed)
Left message to call Lachanda Buczek, RN at [facility] [phone].   

## 2020-01-07 NOTE — Telephone Encounter (Signed)
Given that she is under quarantine, go ahead and treat her with metrogel, 1 applicator vaginally qhs x 5 days. If her symptoms persist, she needs to be seen.

## 2020-01-07 NOTE — Telephone Encounter (Signed)
Patient returned a call to Jill.   

## 2020-01-08 MED ORDER — METRONIDAZOLE 500 MG PO TABS: 500.0000 mg | ORAL_TABLET | Freq: Two times a day (BID) | ORAL | 0 refills | Status: DC

## 2020-01-08 MED FILL — METRONIDAZOLE 500 MG TABS: 500 | 7 days supply | Qty: 14 | Fill #0

## 2020-01-08 NOTE — Telephone Encounter (Signed)
Spoke with patient. Patient states Metrogel causes more irritation, she is requesting to change Rx to Flagyl PO. Advised patient I will have to review request with Dr. Talbert Nan and f/u with recommendations. Advised patient Dr. Talbert Nan is not in the office today, response may not be immediate, patient agreeable.   Dr. Talbert Nan -please advise on Flagyl PO.

## 2020-01-08 NOTE — Telephone Encounter (Signed)
Reviewed with Dr. Talbert Nan. OK to send Flagyl 500 mg PO bid x7 days. #14/0RF  Call to patient, advised as seen above. ETOH precautions reviewed. Rx to verified pharmacy.   Encounter closed.

## 2020-01-21 ENCOUNTER — Other Ambulatory Visit (INDEPENDENT_AMBULATORY_CARE_PROVIDER_SITE_OTHER): Payer: No Typology Code available for payment source

## 2020-01-21 ENCOUNTER — Other Ambulatory Visit: Payer: Self-pay

## 2020-01-21 VITALS — Ht 68.0 in | Wt 227.0 lb

## 2020-01-21 DIAGNOSIS — N926 Irregular menstruation, unspecified: Secondary | ICD-10-CM

## 2020-01-21 DIAGNOSIS — R300 Dysuria: Secondary | ICD-10-CM | POA: Diagnosis not present

## 2020-01-21 LAB — POCT URINALYSIS DIPSTICK
Bilirubin, UA: NEGATIVE
Blood, UA: NEGATIVE
Glucose, UA: NEGATIVE
Ketones, UA: NEGATIVE
Leukocytes, UA: NEGATIVE
Nitrite, UA: NEGATIVE
Protein, UA: NEGATIVE
Spec Grav, UA: 1.015 (ref 1.010–1.025)
Urobilinogen, UA: 0.2 E.U./dL
pH, UA: 7 (ref 5.0–8.0)

## 2020-01-21 LAB — POCT URINE PREGNANCY: Preg Test, Ur: NEGATIVE

## 2020-01-25 ENCOUNTER — Ambulatory Visit (INDEPENDENT_AMBULATORY_CARE_PROVIDER_SITE_OTHER): Payer: No Typology Code available for payment source | Admitting: Adult Health

## 2020-01-25 ENCOUNTER — Other Ambulatory Visit: Payer: Self-pay

## 2020-01-25 ENCOUNTER — Encounter (INDEPENDENT_AMBULATORY_CARE_PROVIDER_SITE_OTHER): Payer: Self-pay | Admitting: Adult Health

## 2020-01-25 VITALS — BP 105/71 | HR 63 | Temp 98.0°F | Ht 68.0 in | Wt 229.0 lb

## 2020-01-25 DIAGNOSIS — E669 Obesity, unspecified: Secondary | ICD-10-CM | POA: Diagnosis not present

## 2020-01-25 DIAGNOSIS — Z6834 Body mass index (BMI) 34.0-34.9, adult: Secondary | ICD-10-CM | POA: Diagnosis not present

## 2020-01-25 DIAGNOSIS — Z9189 Other specified personal risk factors, not elsewhere classified: Secondary | ICD-10-CM | POA: Diagnosis not present

## 2020-01-25 DIAGNOSIS — E119 Type 2 diabetes mellitus without complications: Secondary | ICD-10-CM | POA: Diagnosis not present

## 2020-01-25 DIAGNOSIS — E559 Vitamin D deficiency, unspecified: Secondary | ICD-10-CM

## 2020-01-25 MED ORDER — VITAMIN D (ERGOCALCIFEROL) 1.25 MG (50000 UNIT) PO CAPS: 50000.0000 [IU] | ORAL_CAPSULE | ORAL | 0 refills | Status: DC

## 2020-01-25 MED ORDER — METFORMIN HCL 500 MG PO TABS: 500.0000 mg | ORAL_TABLET | Freq: Every morning | ORAL | 0 refills | Status: DC

## 2020-01-25 MED FILL — METFORMIN HCL 500 MG TABS: 500 | 30 days supply | Qty: 30 | Fill #0

## 2020-01-25 MED FILL — VIT D2 1.25 MG (50,000 UNIT: 1.25 MG | 28 days supply | Qty: 4 | Fill #0

## 2020-01-27 NOTE — Progress Notes (Signed)
Chief Complaint:   [name] [name] is here to discuss her progress with her [name] treatment plan along with follow-up of her [name] related diagnoses. [name] [name] is on the Category 2 Plan. [name] [name] states she is exercising 0 minutes 0 times per week.  Today's visit was #: 11 Starting weight: 243 lbs Starting date: 06/18/2019 Today's weight: 229 lbs Today's date: 01/25/2020 Total lbs lost to date: 14 Total lbs lost since last in-office visit: 0  Interim History: [name] [name] received her first [place] COVID-19 vaccine 01/15/2020 and will receive her second dose on 02/05/2020. She denies any vaccine side effects. She recently purchased a home with her sister and will live there with her son and sister. Family will also be eating similar to her Category 2 meal plan - great!  Subjective:   Vitamin D deficiency. [name] [name] is on Ergocalciferol. No nausea, vomiting, or muscle weakness.    Ref. Range 10/14/2019 10:50  Vitamin D, 25-Hydroxy Latest Ref Range: 30.0 - 100.0 ng/mL 43.7   Type 2 diabetes mellitus without complication, without long-term current use of insulin ([name] [name]). Last A1c was at goal and denies episodes of hypoglycemia  [name] [name] will experience diarrhea if she takes metformin with a protein shake and not a full meal.  Lab Results  Component Value Date   HGBA1C 5.6 10/14/2019   HGBA1C 5.7 (H) 06/18/2019   HGBA1C 5.3 01/08/2019   Lab Results  Component Value Date   MICROALBUR 30 01/09/2017   LDLCALC 121 (H) 10/14/2019   CREATININE 1.05 (H) 09/30/2019   Lab Results  Component Value Date   INSULIN 11.9 10/14/2019   INSULIN 10.1 06/18/2019   At risk for diarrhea. [name] [name] is at higher risk of diarrhea due to metformin for type II diabetes and will occasionally experience diarrhea if not taken with food.  Assessment/Plan:   Vitamin D deficiency. Low Vitamin D level contributes to fatigue and are associated with [name], breast, and colon cancer. She was given a refill on  her Vitamin D, Ergocalciferol, (DRISDOL) 1.25 MG (50000 UNIT) CAPS capsule every week #4 with 0 refills and will follow-up for routine testing of Vitamin D at her next office visit.  Type 2 diabetes mellitus without complication, without long-term current use of insulin ([place]). Good blood sugar control is important to decrease the likelihood of diabetic complications such as nephropathy, neuropathy, limb loss, blindness, coronary artery disease, and death. Intensive lifestyle modification including diet, exercise and weight loss are the first line of treatment for diabetes. Refill was given for metformin (GLUCOPHAGE) 500 MG tablet daily, take with food, #30 with 0 refills. Will check labs at her next office visit.  At risk for diarrhea. [name] [name] was given approximately 15 minutes of diarrhea prevention counseling today. She is 39 y.o. female and has risk factors for diarrhea including medications and changes in diet. We discussed intensive lifestyle modifications today with an emphasis on specific weight loss instructions including dietary strategies. [name] [name] will take metformin with food, recommend eating a full meal rather than a protein shake with this medication to reduce incidence of GI upset.  Repetitive spaced learning was employed today to elicit superior memory formation and behavioral change.  Class 1 [name] with serious comorbidity and body mass index (BMI) of 34.0 to 34.9 in adult, unspecified [name] type.  [name] [name] is currently in the action stage of change. As such, her goal is to continue with weight loss efforts. She has agreed to the Category 2 Plan.   Exercise goals: No  exercise has been prescribed at this time.  Behavioral modification strategies: increasing lean protein intake, meal planning and cooking strategies and planning for success.  [name] [name] has agreed to follow-up with our clinic fasting in 3 weeks. She was informed of the importance of frequent follow-up visits to maximize  her success with intensive lifestyle modifications for her multiple health conditions.   Objective:   Blood pressure 105/71, pulse 63, temperature 98 F (36.7 C), height 5\' 8"  (1.727 m), weight 229 lb (103.9 kg), SpO2 98 %. Body mass index is 34.82 kg/m.  General: Cooperative, alert, well developed, in no acute distress. HEENT: Conjunctivae and lids unremarkable. Cardiovascular: Regular rhythm.  Lungs: Normal work of breathing. Neurologic: No focal deficits.   Lab Results  Component Value Date   CREATININE 1.05 (H) 09/30/2019   BUN 17 09/30/2019   NA 138 09/30/2019   K 4.0 09/30/2019   CL 106 09/30/2019   CO2 24 09/30/2019   Lab Results  Component Value Date   ALT 10 07/20/2019   AST 13 07/20/2019   ALKPHOS 74 07/20/2019   BILITOT 0.4 07/20/2019   Lab Results  Component Value Date   HGBA1C 5.6 10/14/2019   HGBA1C 5.7 (H) 06/18/2019   HGBA1C 5.3 01/08/2019   HGBA1C 5.5 05/30/2017   HGBA1C 6.4 01/09/2017   Lab Results  Component Value Date   INSULIN 11.9 10/14/2019   INSULIN 10.1 06/18/2019   Lab Results  Component Value Date   TSH 0.764 09/30/2019   Lab Results  Component Value Date   CHOL 181 10/14/2019   HDL 44 10/14/2019   LDLCALC 121 (H) 10/14/2019   TRIG 85 10/14/2019   CHOLHDL 3.7 01/08/2019   Lab Results  Component Value Date   WBC 6.9 09/30/2019   HGB 13.4 09/30/2019   HCT 39.8 09/30/2019   MCV 89.2 09/30/2019   PLT 268 09/30/2019   Lab Results  Component Value Date   FERRITIN 9 (L) 03/14/2016   Attestation Statements:   Reviewed by clinician on day of visit: allergies, medications, problem list, medical history, surgical history, family history, social history, and previous encounter notes.  I, Michaelene Song, am acting as Location manager for [employer], NP-C   I have reviewed the above documentation for accuracy and completeness, and I agree with the above. -  Esaw Grandchild, NP

## 2020-01-28 ENCOUNTER — Other Ambulatory Visit (INDEPENDENT_AMBULATORY_CARE_PROVIDER_SITE_OTHER): Payer: No Typology Code available for payment source | Admitting: Family Medicine

## 2020-01-28 DIAGNOSIS — F410 Panic disorder [episodic paroxysmal anxiety] without agoraphobia: Secondary | ICD-10-CM

## 2020-01-28 DIAGNOSIS — F430 Acute stress reaction: Secondary | ICD-10-CM

## 2020-01-28 MED ORDER — CLONAZEPAM 0.5 MG PO TABS: 0.5000 mg | ORAL_TABLET | Freq: Two times a day (BID) | ORAL | 1 refills | Status: DC | PRN

## 2020-01-28 MED FILL — clonazePAM 0.5 MG TABS: 0.5 | 10 days supply | Qty: 20 | Fill #0

## 2020-01-28 NOTE — Progress Notes (Signed)
Called into room with patient for CC: panic attack. History of the same and felt similar. Came on suddenly, with racing heart, SOB, sense of dread. She was taken to a triage room by [place]. VSS - initially HR in 120, regular. After a few minutes, decreased to 80s. Reviewed chart with patient's permission. Hx of anxiety, recent stressors. 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 Had been given Valium for severe stress, but this makes her too sleepy so she never takes it at work. After reviewing options, decided on limited Rx of Klonopin as below for patient to take daily to BID. She will establish with a new provider.   Note: Considered propranolol prn but hesitate with Hx of bradycardia. Considered hydroxyzine but too sedating for patient. Red flags reviewed with patient. She will hold on taking the Valium.   Meds ordered this encounter  Medications  . clonazePAM (KLONOPIN) 0.5 MG tablet    Sig: Take 1 tablet (0.5 mg total) by mouth 2 (two) times daily as needed for anxiety.    Dispense:  20 tablet    Refill:  1

## 2020-02-05 DIAGNOSIS — U071 COVID-19: Secondary | ICD-10-CM

## 2020-02-05 HISTORY — DX: COVID-19: U07.1

## 2020-02-09 ENCOUNTER — Telehealth: Payer: Self-pay | Admitting: Physician Assistant

## 2020-02-09 ENCOUNTER — Encounter: Payer: Self-pay | Admitting: Physician Assistant

## 2020-02-09 ENCOUNTER — Other Ambulatory Visit: Payer: Self-pay | Admitting: Physician Assistant

## 2020-02-09 DIAGNOSIS — Z9884 Bariatric surgery status: Secondary | ICD-10-CM

## 2020-02-09 DIAGNOSIS — Z87891 Personal history of nicotine dependence: Secondary | ICD-10-CM

## 2020-02-09 DIAGNOSIS — U071 COVID-19: Secondary | ICD-10-CM

## 2020-02-09 DIAGNOSIS — E6609 Other obesity due to excess calories: Secondary | ICD-10-CM

## 2020-02-09 DIAGNOSIS — I2510 Atherosclerotic heart disease of native coronary artery without angina pectoris: Secondary | ICD-10-CM

## 2020-02-09 DIAGNOSIS — E119 Type 2 diabetes mellitus without complications: Secondary | ICD-10-CM

## 2020-02-09 NOTE — Telephone Encounter (Signed)
Patient has tested positive for covid and wants to know if she can get set up for an antibody transfusion, she also would like meds for the symptoms. She is very sick she states.

## 2020-02-09 NOTE — Telephone Encounter (Signed)
Called and left message with antibody clinic.   Patient aware to monitor symptoms, push fluids and rest. Patient aware to call EMS for SOB, Difficulty breathing. Patient verbalized understanding.

## 2020-02-09 NOTE — Progress Notes (Signed)
I connected by phone with [name] on 02/09/2020 at 3:29 PM to discuss the potential use of a new treatment for mild to moderate COVID-19 viral infection in non-hospitalized patients.  This patient is a 39 y.o. female that meets the FDA criteria for Emergency Use Authorization of COVID monoclonal antibody casirivimab/imdevimab.  Has a (+) direct SARS-CoV-2 viral test result  Has mild or moderate COVID-19   Is NOT hospitalized due to COVID-19  Is within 10 days of symptom onset  Has at least one of the high risk factor(s) for progression to severe COVID-19 and/or hospitalization as defined in EUA.  Specific high risk criteria : BMI > 25, Diabetes and Cardiovascular disease or hypertension   I have spoken and communicated the following to the patient or parent/caregiver regarding COVID monoclonal antibody treatment:  1. FDA has authorized the emergency use for the treatment of mild to moderate COVID-19 in adults and pediatric patients with positive results of direct SARS-CoV-2 viral testing who are 27 years of age and older weighing at least 40 kg, and who are at high risk for progressing to severe COVID-19 and/or hospitalization.  2. The significant known and potential risks and benefits of COVID monoclonal antibody, and the extent to which such potential risks and benefits are unknown.  3. Information on available alternative treatments and the risks and benefits of those alternatives, including clinical trials.  4. Patients treated with COVID monoclonal antibody should continue to self-isolate and use infection control measures (e.g., wear mask, isolate, social distance, avoid sharing personal items, clean and disinfect "high touch" surfaces, and frequent handwashing) according to CDC guidelines.   5. The patient or parent/caregiver has the option to accept or refuse COVID monoclonal antibody treatment.  After reviewing this information with the patient, the patient has agreed to  receive one of the available covid 19 monoclonal antibodies and will be provided an appropriate fact sheet prior to infusion.  Sx onset 10/3. Set up for infusion on 10/6 @ 10:30am . Directions given to [facility]. Pt is aware that insurance will be charged an infusion fee. Pt is partially vaccinated (had second shot last Friday).   Angelena Form 02/09/2020 3:29 PM

## 2020-02-10 ENCOUNTER — Ambulatory Visit (HOSPITAL_COMMUNITY)
Admission: RE | Admit: 2020-02-10 | Discharge: 2020-02-10 | Disposition: A | Discharge status: Home / Self Care | Payer: No Typology Code available for payment source | Source: Ambulatory Visit | Attending: Pulmonary Disease | Admitting: Pulmonary Disease

## 2020-02-10 DIAGNOSIS — U071 COVID-19: Secondary | ICD-10-CM | POA: Insufficient documentation

## 2020-02-10 DIAGNOSIS — E119 Type 2 diabetes mellitus without complications: Secondary | ICD-10-CM | POA: Insufficient documentation

## 2020-02-10 DIAGNOSIS — E6609 Other obesity due to excess calories: Secondary | ICD-10-CM | POA: Diagnosis not present

## 2020-02-10 DIAGNOSIS — Z9884 Bariatric surgery status: Secondary | ICD-10-CM | POA: Diagnosis present

## 2020-02-10 DIAGNOSIS — I2510 Atherosclerotic heart disease of native coronary artery without angina pectoris: Secondary | ICD-10-CM | POA: Diagnosis present

## 2020-02-10 DIAGNOSIS — Z87891 Personal history of nicotine dependence: Secondary | ICD-10-CM | POA: Insufficient documentation

## 2020-02-10 MED ORDER — METHYLPREDNISOLONE SODIUM SUCC 125 MG IJ SOLR: 125.0000 mg | Freq: Once | INTRAMUSCULAR | Status: DC | PRN

## 2020-02-10 MED ORDER — DIPHENHYDRAMINE HCL 50 MG/ML IJ SOLN: 50.0000 mg | Freq: Once | INTRAMUSCULAR | Status: DC | PRN

## 2020-02-10 MED ORDER — ALBUTEROL SULFATE HFA 108 (90 BASE) MCG/ACT IN AERS: 2.0000 | INHALATION_SPRAY | Freq: Once | RESPIRATORY_TRACT | Status: DC | PRN

## 2020-02-10 MED ORDER — EPINEPHRINE 0.3 MG/0.3ML IJ SOAJ: 0.3000 mg | Freq: Once | INTRAMUSCULAR | Status: DC | PRN

## 2020-02-10 MED ORDER — SODIUM CHLORIDE 0.9 % IV SOLN
1200.0000 mg | Freq: Once | INTRAVENOUS | Status: AC
  Administered 2020-02-10: 1200 mg via INTRAVENOUS

## 2020-02-10 MED ORDER — FAMOTIDINE IN NACL 20-0.9 MG/50ML-% IV SOLN: 20.0000 mg | Freq: Once | INTRAVENOUS | Status: DC | PRN

## 2020-02-10 MED ORDER — SODIUM CHLORIDE 0.9 % IV SOLN: INTRAVENOUS | Status: DC | PRN

## 2020-02-10 NOTE — Progress Notes (Signed)
  Diagnosis: COVID-19  Physician:Dr Joya Gaskins   Procedure: [facility] Med: casirivimab\imdevimab infusion - Provided patient with casirivimab\imdevimab fact sheet for patients, parents and caregivers prior to infusion.  Complications: No immediate complications noted.  Discharge: Discharged home   Dewaine Oats 02/10/2020

## 2020-02-10 NOTE — Discharge Instructions (Signed)

## 2020-02-15 ENCOUNTER — Ambulatory Visit (INDEPENDENT_AMBULATORY_CARE_PROVIDER_SITE_OTHER): Payer: No Typology Code available for payment source | Admitting: Adult Health

## 2020-03-01 ENCOUNTER — Telehealth: Payer: Self-pay

## 2020-03-01 ENCOUNTER — Encounter: Payer: Self-pay | Admitting: Physician Assistant

## 2020-03-01 NOTE — Telephone Encounter (Signed)
Spoke with patient and advised that we do not have this medication in her chart and Dr Ashok Cordia shows as the last doctor who filled this several years ago. Suggested she call her PCP to discuss her new migraines but she asked that I send the request to Dr Talbert Nan for Imitrex given she use to work here and you knew her. Advised Dr Talbert Nan would not be in this morning but I would send it to her. States that she has not had migraines in years but woke with one this morning. Michela Pitcher that her neurologist is in [place].   York Cerise, CMA

## 2020-03-01 NOTE — Telephone Encounter (Signed)
Patient requesting refill of Imitrex. [facility] [phone].

## 2020-03-02 ENCOUNTER — Other Ambulatory Visit: Payer: Self-pay | Admitting: Physician Assistant

## 2020-03-02 ENCOUNTER — Telehealth: Payer: Self-pay | Admitting: Physician Assistant

## 2020-03-02 ENCOUNTER — Encounter (INDEPENDENT_AMBULATORY_CARE_PROVIDER_SITE_OTHER): Payer: Self-pay | Admitting: Adult Health

## 2020-03-02 DIAGNOSIS — G43909 Migraine, unspecified, not intractable, without status migrainosus: Secondary | ICD-10-CM

## 2020-03-02 MED ORDER — SUMATRIPTAN SUCCINATE 50 MG PO TABS: 50.0000 mg | ORAL_TABLET | ORAL | 0 refills | Status: DC | PRN

## 2020-03-02 MED FILL — SUMATRIPTAN SUCC 50 MG TAB: 50 | 30 days supply | Qty: 10 | Fill #0

## 2020-03-02 NOTE — Telephone Encounter (Signed)
Please advise 

## 2020-03-02 NOTE — Telephone Encounter (Signed)
Patient wants to know if she can be prescribed immatrex. She uses  outpatient. Thanks

## 2020-03-02 NOTE — Telephone Encounter (Signed)
03/02/2020  Maritza sent MyChart message to pt.  Charyl Bigger, CMA

## 2020-03-03 NOTE — Telephone Encounter (Signed)
Left detailed message per DPR.Will return call to pt to check on Rx.

## 2020-03-03 NOTE — Telephone Encounter (Signed)
Please call the patient and see if she has gotten a refill for her Imitrex, if not please send it. Please apologize for the delay.

## 2020-03-03 NOTE — Telephone Encounter (Signed)
Spoke with pt. Pt states getting #10 tablets of Imitrex Rx. Pt taken 5 tablets at this time and migraine still today. Pt advised to return call to office when needs more refills. Pt states in process of changing PCPs and will call back when needs RF.  Routing to Dr Talbert Nan for update  Encounter closed

## 2020-03-07 MED FILL — VIT D2 1.25 MG (50,000 UNIT: 1.25 MG | 28 days supply | Qty: 4 | Fill #0

## 2020-03-16 ENCOUNTER — Other Ambulatory Visit (HOSPITAL_COMMUNITY): Payer: Self-pay | Admitting: Internal Medicine

## 2020-03-16 MED FILL — FLUARIX QUADRIVALENT 0.5 ML: 0.5 | 1 days supply | Qty: 1 | Fill #0

## 2020-04-19 ENCOUNTER — Other Ambulatory Visit (INDEPENDENT_AMBULATORY_CARE_PROVIDER_SITE_OTHER): Payer: Self-pay | Admitting: Adult Health

## 2020-04-19 DIAGNOSIS — E559 Vitamin D deficiency, unspecified: Secondary | ICD-10-CM

## 2020-04-19 MED FILL — VIT D2 1.25 MG (50,000 UNIT: 1.25 MG | 28 days supply | Qty: 4 | Fill #0

## 2020-05-25 ENCOUNTER — Other Ambulatory Visit: Payer: No Typology Code available for payment source

## 2020-05-25 DIAGNOSIS — Z20822 Contact with and (suspected) exposure to covid-19: Secondary | ICD-10-CM

## 2020-05-26 ENCOUNTER — Ambulatory Visit (INDEPENDENT_AMBULATORY_CARE_PROVIDER_SITE_OTHER): Payer: No Typology Code available for payment source | Admitting: Family Medicine

## 2020-05-27 ENCOUNTER — Telehealth: Payer: Self-pay | Admitting: Cardiology

## 2020-05-27 NOTE — Telephone Encounter (Signed)
Spoke to pt who report she is starting to experience episodes of dizziness and low HR again. Pt report on Wednesday while working at the [facility], pt return from the bathroom and felt very dizzy. She state coworkers reported she was extremely pale and lowered her to the floor. Pt report BP of 100/72 HR 47. Once stable, pt stood up for roughly 3 minutes then immediately felt dizzy again. She noted HR dropped to 29 then within a min increased back to 60.   Today pt report c/o headache and dizziness with movement. She currently denies symptoms of feeling like she is going to pass out but HR did drop to 49 while standing. HR currently 72.   Pt is questioning whether MD recommends repeating monitor.

## 2020-05-27 NOTE — Telephone Encounter (Signed)
STAT if HR is under 50 or over 120 (normal HR is 60-100 beats per minute)  1) What is your heart rate? States it is fine right now  2) Do you have a log of your heart rate readings (document readings)? 100/72 HR 47, 125/86 HR 62, 97/63 HR 39. 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 Yesterday she went from sitting to standing and her HR was 120. Another time she went from sitting to standing her HR went from 32 to 60. And then went from 72 sitting, 28 standing then back to 60.   3) Do you have any other symptoms? Headaches and dizziness   STAT if patient feels like he/she is going to faint   1) Are you dizzy now? No just really bad headache  2) Do you feel faint or have you passed out? 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 Fluctuating HR and headaches  4) Have you checked your HR and BP (record if available)? See above

## 2020-05-28 LAB — NOVEL CORONAVIRUS, NAA: SARS-CoV-2, NAA: NOT DETECTED

## 2020-05-30 ENCOUNTER — Telehealth: Payer: Self-pay | Admitting: Physician Assistant

## 2020-05-30 NOTE — Telephone Encounter (Signed)
Pt called because her  HR has been dropping again. Lowest was 29. She will get dizzy and light-headed with this. After about 60-90 seconds, her HR will come back up.  She was sent home from work for this on Friday, after she fell.  Please help.  Not on any rate-lowering meds.  Will discuss w/ Dr Harrell Gave and make sure pt gets a call back.  Rosaria Ferries, PA-C 05/30/2020 6:59 AM

## 2020-05-30 NOTE — Telephone Encounter (Signed)
Spoke to Dr Harrell Gave about the presyncope w/ bradycardia and called the patient.  Recommendations are: Liberalize salt intake Drink plenty of water Wear support or compression socks  Explained that keeping fluid in the vascular space will help her sx. She may need an abdominal binder if nothing else works.  At this time, no indication for further testing or a repeat monitor.  Will arrange f/u with Dr Harrell Gave.  Rosaria Ferries, PA-C 05/30/2020 10:51 AM

## 2020-06-01 NOTE — Telephone Encounter (Signed)
Patient calling back. She states she is still having symptoms and has been doing everything she was told to do.

## 2020-06-01 NOTE — Telephone Encounter (Signed)
Called patient, no answer, voicemail is full.

## 2020-06-02 NOTE — Telephone Encounter (Signed)
Patient has appointment scheduled for 06/10/20.  No answer. Left message for patient to call us back if she is still having issues to be addressed before upcoming appointment.

## 2020-06-06 NOTE — Telephone Encounter (Signed)
Pt has an appointment scheduled with Dr. Harrell Gave this week 2/8.

## 2020-06-08 ENCOUNTER — Emergency Department (HOSPITAL_COMMUNITY)
Admission: EM | Admit: 2020-06-08 | Discharge: 2020-06-08 | Disposition: A | Discharge status: Home / Self Care | Payer: No Typology Code available for payment source | Attending: Emergency Medicine | Admitting: Emergency Medicine

## 2020-06-08 ENCOUNTER — Other Ambulatory Visit: Payer: Self-pay

## 2020-06-08 ENCOUNTER — Encounter (HOSPITAL_COMMUNITY): Payer: Self-pay | Admitting: *Deleted

## 2020-06-08 ENCOUNTER — Ambulatory Visit: Payer: No Typology Code available for payment source | Admitting: Obstetrics and Gynecology

## 2020-06-08 ENCOUNTER — Emergency Department (HOSPITAL_COMMUNITY): Payer: No Typology Code available for payment source

## 2020-06-08 DIAGNOSIS — R079 Chest pain, unspecified: Secondary | ICD-10-CM | POA: Insufficient documentation

## 2020-06-08 DIAGNOSIS — E119 Type 2 diabetes mellitus without complications: Secondary | ICD-10-CM | POA: Diagnosis not present

## 2020-06-08 DIAGNOSIS — I10 Essential (primary) hypertension: Secondary | ICD-10-CM | POA: Diagnosis not present

## 2020-06-08 DIAGNOSIS — R42 Dizziness and giddiness: Secondary | ICD-10-CM | POA: Insufficient documentation

## 2020-06-08 DIAGNOSIS — Z87891 Personal history of nicotine dependence: Secondary | ICD-10-CM | POA: Insufficient documentation

## 2020-06-08 DIAGNOSIS — Z7984 Long term (current) use of oral hypoglycemic drugs: Secondary | ICD-10-CM | POA: Diagnosis not present

## 2020-06-08 LAB — CBC
HCT: 43.1 % (ref 36.0–46.0)
Hemoglobin: 14 g/dL (ref 12.0–15.0)
MCH: 28.8 pg (ref 26.0–34.0)
MCHC: 32.5 g/dL (ref 30.0–36.0)
MCV: 88.7 fL (ref 80.0–100.0)
Platelets: 270 10*3/uL (ref 150–400)
RBC: 4.86 MIL/uL (ref 3.87–5.11)
RDW: 12.2 % (ref 11.5–15.5)
WBC: 7.6 10*3/uL (ref 4.0–10.5)
nRBC: 0 % (ref 0.0–0.2)

## 2020-06-08 LAB — BASIC METABOLIC PANEL
Anion gap: 10 (ref 5–15)
BUN: 14 mg/dL (ref 6–20)
CO2: 25 mmol/L (ref 22–32)
Calcium: 9 mg/dL (ref 8.9–10.3)
Chloride: 102 mmol/L (ref 98–111)
Creatinine, Ser: 0.89 mg/dL (ref 0.44–1.00)
GFR, Estimated: >60 mL/min (ref >60)
Glucose, Bld: 108 mg/dL — ABNORMAL HIGH (ref 70–99)
Potassium: 4 mmol/L (ref 3.5–5.1)
Sodium: 137 mmol/L (ref 135–145)

## 2020-06-08 LAB — TROPONIN I (HIGH SENSITIVITY)
Troponin I (High Sensitivity): 2 ng/L (ref <18)
Troponin I (High Sensitivity): <2 ng/L (ref <18)

## 2020-06-08 LAB — I-STAT BETA HCG BLOOD, ED (MC, WL, AP ONLY): I-stat hCG, quantitative: <5 m[IU]/mL (ref <5)

## 2020-06-08 NOTE — Discharge Instructions (Addendum)
Your work-up here in the ER was reassuring.  Please follow-up with your cardiologist.  Please drink plenty of water, take your medications as prescribed. You may take Tylenol 1000 mg every 6 hours for pain.  You may alternate with ibuprofen 600 mg every 6 hours.

## 2020-06-08 NOTE — ED Provider Notes (Signed)
[facility] EMERGENCY DEPARTMENT Provider Note   CSN: [id number] Arrival date & time: 06/08/20  [id number]     History Chief Complaint  Patient presents with  . Chest Pain    [name] [name] is a 40 y.o. female past medical history significant for DM 2, pancreatitis, anxiety, polycystic ovary, obesity--s/p gastric sleeve--bradycardia, endometriosis, near syncope  HPI Patient is a 40 year old female with intermittent chest pain for approximately 10 days.  She states that she intermittently feels lightheaded.  She states that she feels occasionally like her heart rate will slow down.  She states that her heart rate is normally around 80 and has been approximately 60 in the ER today.  She states that she has had symptoms several times in the past and wore a heart monitor however she states when her cardiologist had her wear a heart monitor she denies any episodes of these symptoms.  She states that they have become more frequent and she is scheduled to see her cardiologist on Friday.  She came to the ER today because she woke up this morning with chest pain at approximately 6 AM.  She states that has been constant since it started at 6 AM.  Does not seem to be exertional or pleuritic.   She describes the pain in her chest as 5/10 and achy.  She denies any shortness of breath, nausea or vomiting.  She denies any fevers or chills or cough or congestion.  She states that she was diagnosed with Covid several months ago has not had any recurrence of the symptoms she had.  No recent surgeries, hospitalization, long travel, hemoptysis, estrogen containing OCP, cancer history.  No unilateral leg swelling.  No history of PE or VTE.     Past Medical History:  Diagnosis Date  . Abnormal uterine bleeding   . Amenorrhea   . Anemia   . Anxiety    panic attacks  . Back pain   . Bradycardia   . Chest pain   . Constipation   . Diabetes ([place])   . Dysmenorrhea   . Endometriosis   .  Gallbladder problem   . GERD (gastroesophageal reflux disease)    occasional - diet controlled and tums prn  . HLD (hyperlipidemia)   . HTN (hypertension)   . IBS (irritable bowel syndrome)   . Infertility, female   . Insulin resistance   . Migraine without aura   . Missed abortion    x 2 - both resolved without surgery  . Obesity   . PCOS (polycystic ovarian syndrome)   . PCOS (polycystic ovarian syndrome)   . PONV (postoperative nausea and vomiting)    pe rpatient ; scopalamine patch very helpful   . Sleep apnea   . SVD (spontaneous vaginal delivery)    x 1  . Urinary incontinence   . Vitamin D deficiency     Patient Active Problem List   Diagnosis Date Noted  . Near syncope 11/04/2019  . Hyperlipidemia associated with type 2 diabetes mellitus ([place]) 11/04/2019  . Prediabetes 10/15/2019  . Vitamin D deficiency 08/06/2019  . Constipation 08/06/2019  . Endometriosis   . Candidiasis, cutaneous 07/14/2018  . Flu-like symptoms 07/09/2018  . Pharyngitis 07/09/2018  . GAD (generalized anxiety disorder) 06/02/2018  . Dysuria 03/27/2018  . Family history of parkinsonism 03/27/2018  . Dizziness 03/27/2018  . Bradycardia 03/23/2018  . HLD (hyperlipidemia) 03/23/2018  . Coronary atherosclerosis 03/23/2018  . Chest pain 03/22/2018  . Varicose vein of leg  12/24/2017  . Venous reflux 12/24/2017  . Leukocytosis 09/29/2017  . Nausea 09/29/2017  . Anxiety 09/29/2017  . Pancreatitis 09/28/2017  . Dehydration 03/12/2017  . 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 Sleep apnea 01/09/2017  . Healthcare maintenance 10/18/2016  . Type 2 diabetes mellitus without complication, without long-term current use of insulin ([place]) 10/18/2016  . LOW BACK PAIN, ACUTE 11/20/2006  . POLYCYSTIC OVARY 07/04/2006  . OBESITY, NOS 07/04/2006  . TOBACCO USE, QUIT 07/04/2006    Past Surgical History:  Procedure Laterality Date  . CHOLECYSTECTOMY    . COLONOSCOPY  2017   polyps   . CYSTOSCOPY N/A 10/29/2016   Procedure: CYSTOSCOPY;  Surgeon: Salvadore Dom, MD;  Location: [facility];  Service: Gynecology;  Laterality: N/A;  . DILATATION & CURRETTAGE/HYSTEROSCOPY WITH RESECTOCOPE N/A 10/17/2012   Procedure: DILATATION & CURETTAGE/HYSTEROSCOPY WITH RESECTOCOPE; Possible Polypectomy, Possible Resectoscopic Myomectomy.;  Surgeon: Floyce Stakes. Pamala Hurry, MD;  Location: [facility];  Service: Gynecology;  Laterality: N/A;  1 hr.  Marland Kitchen DILATION AND CURETTAGE OF UTERUS    . ENDOMETRIAL BIOPSY    . ENDOVENOUS ABLATION SAPHENOUS VEIN W/ LASER Right 05/08/2018   endovenous laser ablation right greater saphenous vein and stab phlebectomy > 20 incisions right leg by Deitra Mayo MD   . ESOPHAGOGASTRODUODENOSCOPY ENDOSCOPY  2017   Normal  . HYSTEROSCOPY WITH D & C N/A 12/01/2013   Procedure: IUD Removal;  Surgeon: Floyce Stakes. Pamala Hurry, MD;  Location: [facility];  Service: Gynecology;  Laterality: N/A;  90 min.  . INTRAUTERINE DEVICE (IUD) INSERTION N/A 10/17/2012   Procedure: INTRAUTERINE DEVICE (IUD) INSERTION; Mirena;  Surgeon: Floyce Stakes. Pamala Hurry, MD;  Location: [facility];  Service: Gynecology;  Laterality: N/A;  . LAPAROSCOPIC GASTRIC SLEEVE RESECTION N/A 02/19/2017   Procedure: LAPAROSCOPIC GASTRIC SLEEVE RESECTION, UPPER ENDOSCOPY;  Surgeon: Johnathan Hausen, MD;  Location: [facility];  Service: General;  Laterality: N/A;  . LAPAROSCOPIC SALPINGO OOPHERECTOMY Left 10/29/2016   Procedure: LAPAROSCOPIC SALPINGO OOPHORECTOMY With anterior uterine biopsy;  Surgeon: Salvadore Dom, MD;  Location: [facility];  Service: Gynecology;  Laterality: Left;  . LASIK  10/06/2018  . MOUTH SURGERY     wisdom teeth     OB History    Gravida  3   Para  1   Term  1   Preterm      AB  2   Living  1     SAB  2   IAB      Ectopic      Multiple      Live Births  1           Family History  Problem Relation Age of Onset  . Diabetes Father   . Thyroid disease Father   . Hypertension Father    . Parkinson's disease Father   . Dementia Father   . Hyperlipidemia Father   . Heart disease Father   . Sleep apnea Father   . Obesity Father   . Diabetes Maternal Grandmother   . Heart disease Maternal Grandmother   . Diabetes Maternal Grandfather   . Heart disease Maternal Grandfather   . Stroke Maternal Grandfather   . Diabetes Paternal Grandmother   . Diabetes Paternal Grandfather   . Heart disease Paternal Grandfather   . Alcohol abuse Paternal Grandfather   . Hypertension Mother   . Diabetes Mother   . Depression Mother   . Obesity Mother   . Colon cancer Maternal Uncle  Social History   Tobacco Use  . Smoking status: Former Smoker    Packs/day: 0.25    Years: 19.00    Pack years: 4.75    Types: Cigarettes    Quit date: 06/06/2016    Years since quitting: 4.0  . Smokeless tobacco: Never Used  Vaping Use  . Vaping Use: Never used  Substance Use Topics  . Alcohol use: Yes    Comment: 1 a month  . Drug use: No    Home Medications Prior to Admission medications   Medication Sig Start Date End Date Taking? Authorizing Provider  APPLE CIDER VINEGAR PO Take 1 tablet by mouth daily.    [provider]  ascorbic acid (VITAMIN C) 500 MG tablet Take 500 mg by mouth daily.    [provider]  Biotin w/ Vitamins C & E (HAIR/SKIN/NAILS PO) Take 1 tablet by mouth daily.     [provider]  Chromium Picolinate 200 MCG CAPS Take 1 capsule by mouth daily.    [provider]  ciprofloxacin (CIPRO) 500 MG tablet Take 1 tablet (500 mg total) by mouth 2 (two) times daily. 12/23/19   Mayer MaskerAbonza, Maritza, PA-C  clonazePAM (KLONOPIN) 0.5 MG tablet Take 1 tablet (0.5 mg total) by mouth 2 (two) times daily as needed for anxiety. 01/28/20   Helane RimaWallace, Erica, DO  Coenzyme Q10 (COQ10) 100 MG CAPS Take 1 capsule by mouth daily.    [provider]  cyclobenzaprine (FLEXERIL) 10 MG tablet Take 1 tablet (10 mg total) by mouth 3 (three) times daily as  needed for muscle spasms. 05/18/19   Danford, Orpha BurKaty D, NP  Docosahexaenoic Acid (DHA COMPLETE PO) Take 1 tablet by mouth daily.    [provider]  IRON PO Take 45 mg by mouth every other day.    [provider]  levonorgestrel (MIRENA) 20 MCG/24HR IUD 1 each by Intrauterine route once.    [provider]  loratadine (CLARITIN) 10 MG tablet Take 10 mg by mouth daily as needed for allergies.    [provider]  metFORMIN (GLUCOPHAGE) 500 MG tablet Take 1 tablet (500 mg total) by mouth every morning. 01/25/20   Danford, Orpha BurKaty D, NP  metroNIDAZOLE (FLAGYL) 500 MG tablet Take 1 tablet (500 mg total) by mouth 2 (two) times daily. 01/08/20   Romualdo BolkJertson, Jill Evelyn, MD  Multiple Vitamins-Minerals (BARIATRIC MULTIVITAMINS/IRON PO) Take 1 tablet by mouth in the morning and at bedtime.    [provider]  nystatin cream (MYCOSTATIN) Apply 1 application topically 2 (two) times daily. Apply to affected area BID for up to 7 days. Patient taking differently: Apply 1 application topically 2 (two) times daily as needed for dry skin.  06/04/19   Romualdo BolkJertson, Jill Evelyn, MD  ondansetron (ZOFRAN) 8 MG tablet Take 1 tablet (8 mg total) by mouth every 8 (eight) hours as needed for nausea or vomiting. 07/09/18   Danford, Orpha BurKaty D, NP  Probiotic Product (PROBIOTIC ADVANCED PO) Take 1 capsule by mouth daily.     [provider]  Spirulina 500 MG TABS Take 1 tablet by mouth daily.    [provider]  SUMAtriptan (IMITREX) 50 MG tablet Take 1 tablet (50 mg total) by mouth every 2 (two) hours as needed for migraine. May repeat in 2 hours if headache persists or recurs. 03/02/20   Abonza, Kandis CockingMaritza, PA-C  Vitamin D, Ergocalciferol, (DRISDOL) 1.25 MG (50000 UNIT) CAPS capsule TAKE 1 CAPSULE BY MOUTH EVERY 7 DAYS 04/19/20  [name] D, NP    Allergies    Iohexol and Pyridium [phenazopyridine hcl]  Review of Systems   Review of Systems  Constitutional: Negative for chills and  fever.  HENT: Negative for congestion.   Eyes: Negative for pain.  Respiratory: Negative for cough and shortness of breath.   Cardiovascular: Positive for chest pain. Negative for leg swelling.  Gastrointestinal: Negative for abdominal pain, diarrhea, nausea and vomiting.  Genitourinary: Negative for dysuria.  Musculoskeletal: Negative for myalgias.  Skin: Negative for rash.  Neurological: Positive for light-headedness. Negative for dizziness and headaches.    Physical Exam Updated Vital Signs BP 113/84 (BP Location: Right Arm)   Pulse (!) 57   Temp 98.5 F (36.9 C) (Oral)   Resp 16   Ht 5\' 8"  (1.727 m)   Wt 107 kg   SpO2 98%   BMI 35.88 kg/m   Physical Exam Vitals and nursing note reviewed.  Constitutional:      General: She is not in acute distress.    Comments: Pleasant well-appearing 40 year old.  In no acute distress.  Sitting comfortably in bed.  Able answer questions appropriately follow commands. No increased work of breathing. Speaking in full sentences.  HENT:     Head: Normocephalic and atraumatic.     Nose: Nose normal.  Eyes:     General: No scleral icterus. Cardiovascular:     Rate and Rhythm: Normal rate and regular rhythm.     Pulses: Normal pulses.     Heart sounds: Normal heart sounds.     Comments: HR 60 on exam BL radial artery pulses 3+  Pulmonary:     Effort: Pulmonary effort is normal. No respiratory distress.     Breath sounds: No wheezing.  Abdominal:     Palpations: Abdomen is soft.     Tenderness: There is no abdominal tenderness.  Musculoskeletal:     Cervical back: Normal range of motion.     Right lower leg: No edema.     Left lower leg: No edema.  Skin:    General: Skin is warm and dry.     Capillary Refill: Capillary refill takes less than 2 seconds.  Neurological:     Mental Status: She is alert. Mental status is at baseline.  Psychiatric:        Mood and Affect: Mood normal.        Behavior: Behavior normal.     ED  Results / Procedures / Treatments   Labs (all labs ordered are listed, but only abnormal results are displayed) Labs Reviewed  BASIC METABOLIC PANEL - Abnormal; Notable for the following components:      Result Value   Glucose, Bld 108 (*)    All other components within normal limits  CBC  I-STAT BETA HCG BLOOD, ED ([facility], [facility], [facility] ONLY)  TROPONIN I (HIGH SENSITIVITY)  TROPONIN I (HIGH SENSITIVITY)    EKG EKG Interpretation  Date/Time:  Wednesday June 08 2020 06:28:05 EST Ventricular Rate:  62 PR Interval:  176 QRS Duration: 98 QT Interval:  416 QTC Calculation: 422 R Axis:   77 Text Interpretation: Normal sinus rhythm Normal ECG Confirmed by Lennice Sites [phone]) on 06/08/2020 9:08:48 AM   Radiology DG Chest 2 View  Result Date: 06/08/2020 CLINICAL DATA:  Chest pain. EXAM: CHEST - 2 VIEW COMPARISON:  09/30/2019. FINDINGS: Mediastinum and hilar structures normal. Heart size normal. Low lung volumes. No focal infiltrate. No pleural effusion or pneumothorax. Degenerative changes and scoliosis thoracic spine. Surgical  clips right upper quadrant. IMPRESSION: Low lung volumes.  No acute cardiopulmonary disease. Electronically Signed   By: Marcello Moores  Register   On: 06/08/2020 06:56    Procedures Procedures   Medications Ordered in ED Medications - No data to display  ED Course  I have reviewed the triage vital signs and the nursing notes.  Pertinent labs & imaging results that were available during my care of the patient were reviewed by me and considered in my medical decision making (see chart for details).    MDM Rules/Calculators/A&P                           Patient is well-appearing 40 year old female with past medical history detailed in HPI does have a history of PCOS but no other reason for patient to have high estrogen. She has had chest pain today.  Her symptoms are consistent since 6 AM.  They are nonradiating nonexertional nonpleuritic.   Doubt PE as patient  denies pleuritic component of CP, denies history of clot, active cancer, immobilization, surgery, hemoptysis, known clotting disorder, is on no estrogen containing medications (although she does have PCOS), denies calf pain and on physical exam has no unilateral leg swelling, calf TTP, tachycardia or hypoxia.  She is PERC negative  Doubt ACS -- Patient's CP is atypical for ACS. Is non-radiating, substernal and is not precipitated/worsened by exertion.  Doubt thoracic aortic dissection as pain lacks tearing or severe quality and does not radiate to back. Patient denies nausea, diaphoresis. Symmetric pulses, no murmur, no neurologic deficit.   Doubt pericarditis as no recent illness, PE without muffled heart sounds or friction rub, CP is non-pleuritic, EKG without global STE or PR depression.  Troponin x2 flat at 2>> less than 2 EKG nonischemic normal sinus rhythm.  Chest x-ray without any acute abnormalities.  CBC without leukocytosis or anemia.  BMP without significant abnormalities only very mildly elevated glucose of 108.  hCG negative for pregnancy.  Vital signs within normal limits.  From mild bradycardia heart rate 57-60 Patient does have 1 BP measurement that is 97/29 however no other episodes of hypotension.  She has not felt lightheaded since she has been here in the ER.  She is ambulated without difficulty.  She has an overall reassuring work-up today.  These symptoms seem to be chronic and she has close follow-up with her cardiologist on Friday. She will call to confirm this appointment. Patient had very strict return precautions and able to use his back. Discharged w/ close return precautions/FU plans.  Riely Oetken Larouche was evaluated in Emergency Department on 06/08/2020 for the symptoms described in the history of present illness. She was evaluated in the context of the global COVID-19 pandemic, which necessitated consideration that the patient might be at risk for infection with the  SARS-CoV-2 virus that causes COVID-19. Institutional protocols and algorithms that pertain to the evaluation of patients at risk for COVID-19 are in a state of rapid change based on information released by regulatory bodies including the CDC and federal and state organizations. These policies and algorithms were followed during the patient's care in the ED.  Final Clinical Impression(s) / ED Diagnoses Final diagnoses:  Chest pain, unspecified type    Rx / DC Orders ED Discharge Orders    None       Tedd Sias, [place] 06/08/20 [place], [place], DO 06/08/20 1605

## 2020-06-08 NOTE — ED Triage Notes (Signed)
Off and on for about 10 days, intermittently, she has felt dizziness and  like her heart rate is dropping and feels like she is going to pass out, notified her cardiologist of the same, due to see her next week for the same. She has had chest pain this morning when she woke up- pain in the center and left of her chest.

## 2020-06-10 ENCOUNTER — Encounter: Payer: Self-pay | Admitting: Cardiology

## 2020-06-10 ENCOUNTER — Other Ambulatory Visit: Payer: Self-pay

## 2020-06-10 ENCOUNTER — Ambulatory Visit (INDEPENDENT_AMBULATORY_CARE_PROVIDER_SITE_OTHER): Payer: No Typology Code available for payment source | Admitting: Cardiology

## 2020-06-10 VITALS — BP 90/72 | HR 49 | Ht 68.0 in | Wt 238.0 lb

## 2020-06-10 DIAGNOSIS — R002 Palpitations: Secondary | ICD-10-CM

## 2020-06-10 DIAGNOSIS — R55 Syncope and collapse: Secondary | ICD-10-CM | POA: Diagnosis not present

## 2020-06-10 DIAGNOSIS — W19XXXS Unspecified fall, sequela: Secondary | ICD-10-CM

## 2020-06-10 DIAGNOSIS — Z8249 Family history of ischemic heart disease and other diseases of the circulatory system: Secondary | ICD-10-CM | POA: Diagnosis not present

## 2020-06-10 DIAGNOSIS — I2510 Atherosclerotic heart disease of native coronary artery without angina pectoris: Secondary | ICD-10-CM

## 2020-06-10 DIAGNOSIS — R001 Bradycardia, unspecified: Secondary | ICD-10-CM

## 2020-06-10 NOTE — Progress Notes (Signed)
Cardiology Office Note:    Date:  06/10/2020   ID:  [name] [name], DOB 1980-08-28, MRN [id number]  PCP:  Lorrene Reid, PA-C  Cardiologist:  Buford Dresser, MD  Referring MD: Lorrene Reid, PA-C   CC: ER follow up  History of Present Illness:    [name] [name] is a 40 y.o. female with a hx of chronic dizziness, PCOS, prior bariatric surgery, former tobacco use, personal history of Covid infection who is seen for follow up.   Today: Reviewed ER note from 06/08/20. Noted to have intermittent chest pain for >1 week but had a constant event that began the morning of presentation. Noted lightheadedness, subjective bradycardia as well. Workup unremarkable, including normal vitals, unremarkable ECG, hsTni low and flat. CXR normal, unremarkable CBC/BMET.  Also reviewed phone note from 05/30/2020. Noted that she gets dizzy/lightheaded, had a fall. HR noted to be as low as 29.   Reviewed 30 day monitor from 04/2018 and more recent monitor from 11/2019. No significant pauses or arrhythmias. Symptoms did not correlate to abnormal heart rhythms. Normal heart rate variability, augments appropriately with activity. Orthostatics done 10/2019 with appropriate elevation in HR with standing without drop in blood pressure.  Was doing well for a while, no issues. Has been drinking a lot of water, wearing compression stockings, adding salt to food. Then got Covid in October. Had some issues with events around Thanksgiving, then worsened after Christmas.  Had an episode leaving the post Covid clinic several weeks ago. Felt dizzy, saw stars, tunnel vision. Did not fully lose consciousness, remembers falling and hitting her face on the running board of her truck. Felt better after sitting for a few minutes.  Has had several events of near syncope, no full syncope. 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  Has become debilitating for her, cannot go shopping or get out of the house without lightheadedness. Has not caused an issue while driving, always has a prodrome.  We discussed options for further evaluation, including loop recorder. She is amenable.  Denies chest pain, shortness of breath at rest or with normal exertion. No PND, orthopnea, LE edema or unexpected weight gain.  Past Medical History:  Diagnosis Date  . Abnormal uterine bleeding   . Amenorrhea   . Anemia   . Anxiety    panic attacks  . Back pain   . Bradycardia   . Chest pain   . Constipation   . Diabetes ([place])   . Dysmenorrhea   . Endometriosis   . Gallbladder problem   . GERD (gastroesophageal reflux disease)    occasional - diet controlled and tums prn  . HLD (hyperlipidemia)   . HTN (hypertension)   . IBS (irritable bowel syndrome)   . Infertility, female   . Insulin resistance   . Migraine without aura   . Missed abortion    x 2 - both resolved without surgery  . Obesity   . PCOS (polycystic ovarian syndrome)   . PCOS (polycystic ovarian syndrome)   . PONV (postoperative nausea and vomiting)    pe rpatient ; scopalamine patch very helpful   . Sleep apnea   . SVD (spontaneous vaginal delivery)    x 1  . Urinary incontinence   . Vitamin D deficiency     Past Surgical History:  Procedure Laterality Date  .  CHOLECYSTECTOMY    . COLONOSCOPY  2017   polyps  . CYSTOSCOPY N/A 10/29/2016   Procedure: CYSTOSCOPY;  Surgeon: Salvadore Dom, MD;  Location: [facility];  Service: Gynecology;  Laterality: N/A;  . DILATATION & CURRETTAGE/HYSTEROSCOPY WITH RESECTOCOPE N/A 10/17/2012   Procedure: DILATATION & CURETTAGE/HYSTEROSCOPY WITH RESECTOCOPE; Possible Polypectomy, Possible Resectoscopic Myomectomy.;  Surgeon: Floyce Stakes. Pamala Hurry, MD;  Location: [facility];  Service: Gynecology;  Laterality: N/A;  1 hr.  Marland Kitchen DILATION AND CURETTAGE OF UTERUS    . ENDOMETRIAL BIOPSY    . ENDOVENOUS ABLATION SAPHENOUS  VEIN W/ LASER Right 05/08/2018   endovenous laser ablation right greater saphenous vein and stab phlebectomy > 20 incisions right leg by Deitra Mayo MD   . ESOPHAGOGASTRODUODENOSCOPY ENDOSCOPY  2017   Normal  . HYSTEROSCOPY WITH D & C N/A 12/01/2013   Procedure: IUD Removal;  Surgeon: Floyce Stakes. Pamala Hurry, MD;  Location: [facility];  Service: Gynecology;  Laterality: N/A;  90 min.  . INTRAUTERINE DEVICE (IUD) INSERTION N/A 10/17/2012   Procedure: INTRAUTERINE DEVICE (IUD) INSERTION; Mirena;  Surgeon: Floyce Stakes. Pamala Hurry, MD;  Location: [facility];  Service: Gynecology;  Laterality: N/A;  . LAPAROSCOPIC GASTRIC SLEEVE RESECTION N/A 02/19/2017   Procedure: LAPAROSCOPIC GASTRIC SLEEVE RESECTION, UPPER ENDOSCOPY;  Surgeon: Johnathan Hausen, MD;  Location: [facility];  Service: General;  Laterality: N/A;  . LAPAROSCOPIC SALPINGO OOPHERECTOMY Left 10/29/2016   Procedure: LAPAROSCOPIC SALPINGO OOPHORECTOMY With anterior uterine biopsy;  Surgeon: Salvadore Dom, MD;  Location: [facility];  Service: Gynecology;  Laterality: Left;  . LASIK  10/06/2018  . MOUTH SURGERY     wisdom teeth    Current Medications: Current Outpatient Medications on File Prior to Visit  Medication Sig  . APPLE CIDER VINEGAR PO Take 1 tablet by mouth daily.  Marland Kitchen ascorbic acid (VITAMIN C) 500 MG tablet Take 500 mg by mouth daily.  . Biotin w/ Vitamins C & E (HAIR/SKIN/NAILS PO) Take 1 tablet by mouth daily.   . Chromium Picolinate 200 MCG CAPS Take 1 capsule by mouth daily.  . ciprofloxacin (CIPRO) 500 MG tablet Take 1 tablet (500 mg total) by mouth 2 (two) times daily.  . clonazePAM (KLONOPIN) 0.5 MG tablet Take 1 tablet (0.5 mg total) by mouth 2 (two) times daily as needed for anxiety.  . Coenzyme Q10 (COQ10) 100 MG CAPS Take 1 capsule by mouth daily.  . cyclobenzaprine (FLEXERIL) 10 MG tablet Take 1 tablet (10 mg total) by mouth 3 (three) times daily as needed for muscle spasms.  . Docosahexaenoic Acid (DHA COMPLETE PO) Take 1 tablet  by mouth daily.  . IRON PO Take 45 mg by mouth every other day.  . levonorgestrel (MIRENA) 20 MCG/24HR IUD 1 each by Intrauterine route once.  . loratadine (CLARITIN) 10 MG tablet Take 10 mg by mouth daily as needed for allergies.  . metFORMIN (GLUCOPHAGE) 500 MG tablet Take 1 tablet (500 mg total) by mouth every morning.  . metroNIDAZOLE (FLAGYL) 500 MG tablet Take 1 tablet (500 mg total) by mouth 2 (two) times daily.  . Multiple Vitamins-Minerals (BARIATRIC MULTIVITAMINS/IRON PO) Take 1 tablet by mouth in the morning and at bedtime.  Marland Kitchen nystatin cream (MYCOSTATIN) Apply 1 application topically 2 (two) times daily. Apply to affected area BID for up to 7 days. (Patient taking differently: Apply 1 application topically 2 (two) times daily as needed for dry skin. )  . ondansetron (ZOFRAN) 8 MG tablet Take 1 tablet (8 mg total) by mouth every 8 (eight)  hours as needed for nausea or vomiting.  . Probiotic Product (PROBIOTIC ADVANCED PO) Take 1 capsule by mouth daily.   Marland Kitchen Spirulina 500 MG TABS Take 1 tablet by mouth daily.  . SUMAtriptan (IMITREX) 50 MG tablet Take 1 tablet (50 mg total) by mouth every 2 (two) hours as needed for migraine. May repeat in 2 hours if headache persists or recurs.  . Vitamin D, Ergocalciferol, (DRISDOL) 1.25 MG (50000 UNIT) CAPS capsule TAKE 1 CAPSULE BY MOUTH EVERY 7 DAYS   No current facility-administered medications on file prior to visit.     Allergies:   Iohexol and Pyridium [phenazopyridine hcl]   Social History   Tobacco Use  . Smoking status: Former Smoker    Packs/day: 0.25    Years: 19.00    Pack years: 4.75    Types: Cigarettes    Quit date: 06/06/2016    Years since quitting: 4.0  . Smokeless tobacco: Never Used  Vaping Use  . Vaping Use: Never used  Substance Use Topics  . Alcohol use: Yes    Comment: 1 a month  . Drug use: No    Family History: family history includes Alcohol abuse in her paternal grandfather; Colon cancer in her maternal  uncle; Dementia in her father; Depression in her mother; Diabetes in her father, maternal grandfather, maternal grandmother, mother, paternal grandfather, and paternal grandmother; Heart disease in her father, maternal grandfather, maternal grandmother, and paternal grandfather; Hyperlipidemia in her father; Hypertension in her father and mother; Obesity in her father and mother; Parkinson's disease in her father; Sleep apnea in her father; Stroke in her maternal grandfather; Thyroid disease in her father. Family history: multiple family members with heart attacks and stents. Mat Gpa had bypass surgery and a pacemaker. Father has heart issues, had cath recently but no stent. Mother has mitral valve prolapse and palpitations. Gena Fray, paternal great-gpa, and uncle all had MI.   ROS:   Please see the history of present illness.  Additional pertinent ROS otherwise unremarkable.  EKGs/Labs/Other Studies Reviewed:    The following studies were reviewed today: Monitor 04/21/18 NSR Rare PAC/PVC s  Symptoms do not correlate with arrhythmia Average HR 69 bpm  CT coronary 03/23/18 Coronary Arteries:  Normal coronary origin.  Right dominance.  RCA is a large dominant artery that gives rise to PDA and PLVB. There is no plaque.  Left main is a large artery that gives rise to LAD and LCX arteries. Left main has no plaque.  LAD is a large vessel that gives rise to two diagonal arteries. Proximal LAD has mild calcified plaque with associated stenosis 0-25%. Mid LAD has a mild non-calcified plaque with associated stenosis 0-25%.  LCX is a small non-dominant artery that gives rise to one small OM1 branch. There is no plaque.  Other findings:  Normal pulmonary vein drainage into the left atrium.  Normal let atrial appendage without a thrombus.  IMPRESSION: 1. Coronary calcium score of 46. This was 63 percentile for age and sex matched control.  2. Normal coronary origin with right  dominance.  3. 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 Aggressive medical management is recommended.  4. Mildly dilated pulmonary artery measuring 32 mm suggestive of pulmonary hypertension.   EKG:  EKG is personally reviewed.  The ekg ordered today demonstrates sinus bradycardia at 49 bpm  Recent Labs: 07/20/2019: ALT 10 09/30/2019: TSH 0.764 06/08/2020: BUN 14; Creatinine, Ser 0.89; Hemoglobin 14.0; Platelets 270; Potassium 4.0; Sodium 137  Recent  Lipid Panel    Component Value Date/Time   CHOL 181 10/14/2019 1050   TRIG 85 10/14/2019 1050   HDL 44 10/14/2019 1050   CHOLHDL 3.7 01/08/2019 0847   CHOLHDL 3.5 03/23/2018 0623   VLDL 8 03/23/2018 0623   LDLCALC 121 (H) 10/14/2019 1050    Physical Exam:    VS:  BP 90/72 (BP Location: Left Arm, Patient Position: Sitting)   Pulse (!) 49   Ht 5\' 8"  (1.727 m)   Wt 238 lb (108 kg)   SpO2 97%   BMI 36.19 kg/m     Wt Readings from Last 3 Encounters:  06/10/20 238 lb (108 kg)  06/08/20 236 lb (107 kg)  01/25/20 229 lb (103.9 kg)    GEN: Well nourished, well developed in no acute distress HEENT: Normal, moist mucous membranes NECK: No JVD CARDIAC: regular rhythm, normal S1 and S2, no rubs or gallops. No murmur. VASCULAR: Radial and DP pulses 2+ bilaterally. No carotid bruits RESPIRATORY:  Clear to auscultation without rales, wheezing or rhonchi  ABDOMEN: Soft, non-tender, non-distended MUSCULOSKELETAL:  Ambulates independently SKIN: Warm and dry, no edema NEUROLOGIC:  Alert and oriented x 3. No focal neuro deficits noted. PSYCHIATRIC:  Normal affect   ASSESSMENT:    1. Near syncope   2. Bradycardia   3. Heart palpitations   4. Family history of heart disease   5. Nonocclusive coronary atherosclerosis of native coronary artery   6. Fall, sequela    PLAN:    Near syncope, bradycardia, palpitations: -not orthostatic on vitals. Monitor without arrhythmia or conduction issues -given level of physical  activity, this may be a normal vagal tone and heart rate response -however, these events can be random and debilitating. Interfering with her ability to work -has done compression stockings, fluids, addition of salt to diet without significant improvement. -given how this is affecting her life, and how sporadic the events are, we discussed the possibility of implantable loop recorder. I am especially concerned given recent fall with injury. -I sent a message to Dr. Rayann Heman to see if she is a candidate for a loop recorder. I showed her images today and discussed what it is, implantation, risk/benefit etc. She is interested if she is a candidate  Chest pain: prior reassuring CT coronary, has minimal nonobstructive CAD. Not suggestive of ischemia -counseled on red flag warning signs that need immediate medical attention  Cardiac risk counseling and prevention recommendations: especially with family history of heart disease -recommend heart healthy/Mediterranean diet, with whole grains, fruits, vegetable, fish, lean meats, nuts, and olive oil. -recommend moderate walking, 3-5 times/week for 30-50 minutes each session. Aim for at least 150 minutes.week. Goal should be pace of 3 miles/hours, or walking 1.5 miles in 30 minutes -recommend avoidance of tobacco products. Avoid excess alcohol. -ASCVD risk score: The ASCVD Risk score Mikey Bussing DC Jr., et al., 2013) failed to calculate for the following reasons:   The 2013 ASCVD risk score is only valid for ages 37 to 26    Plan for follow up: to be determined, based on whether she is criteria for loop recorder  Total time of encounter: 46 minutes total time of encounter, including 40 minutes spent in face-to-face patient care. This time includes coordination of care and counseling regarding potential etiology and management of her symptoms. Remainder of non-face-to-face time involved reviewing chart documents/testing relevant to the patient encounter and  documentation in the medical record.  Buford Dresser, MD, PhD, [place]  Medication Adjustments/Labs and Tests Ordered: Current medicines are reviewed at length with the patient today.  Concerns regarding medicines are outlined above.  Orders Placed This Encounter  Procedures  . EKG 12-Lead   No orders of the defined types were placed in this encounter.   Patient Instructions  Medication Instructions:  Your Physician recommend you continue on your current medication as directed.    *If you need a refill on your cardiac medications before your next appointment, please call your pharmacy*   Lab Work: None  Testing/Procedures: None   Follow-Up: At [facility], you and your health needs are our priority.  As part of our continuing mission to provide you with exceptional heart care, we have created designated Provider Care Teams.  These Care Teams include your primary Cardiologist (physician) and Advanced Practice Providers (APPs -  Physician Assistants and Nurse Practitioners) who all work together to provide you with the care you need, when you need it.  We recommend signing up for the patient portal called "MyChart".  Sign up information is provided on this After Visit Summary.  MyChart is used to connect with patients for Virtual Visits (Telemedicine).  Patients are able to view lab/test results, encounter notes, upcoming appointments, etc.  Non-urgent messages can be sent to your provider as well.   To learn more about what you can do with MyChart, go to [email or web address].    Your next appointment:   To be determined  The format for your next appointment:   In Person  Provider:   Buford Dresser, MD     Signed, Buford Dresser, MD PhD 06/10/2020  [place]

## 2020-06-10 NOTE — Patient Instructions (Signed)
Medication Instructions:  Your Physician recommend you continue on your current medication as directed.    *If you need a refill on your cardiac medications before your next appointment, please call your pharmacy*   Lab Work: None  Testing/Procedures: None   Follow-Up: At [facility], you and your health needs are our priority.  As part of our continuing mission to provide you with exceptional heart care, we have created designated Provider Care Teams.  These Care Teams include your primary Cardiologist (physician) and Advanced Practice Providers (APPs -  Physician Assistants and Nurse Practitioners) who all work together to provide you with the care you need, when you need it.  We recommend signing up for the patient portal called "MyChart".  Sign up information is provided on this After Visit Summary.  MyChart is used to connect with patients for Virtual Visits (Telemedicine).  Patients are able to view lab/test results, encounter notes, upcoming appointments, etc.  Non-urgent messages can be sent to your provider as well.   To learn more about what you can do with MyChart, go to [email or web address].    Your next appointment:   To be determined  The format for your next appointment:   In Person  Provider:   Bridgette Christopher, MD    

## 2020-06-13 ENCOUNTER — Encounter: Payer: Self-pay | Admitting: Cardiology

## 2020-06-14 ENCOUNTER — Other Ambulatory Visit (INDEPENDENT_AMBULATORY_CARE_PROVIDER_SITE_OTHER): Payer: Self-pay | Admitting: Family Medicine

## 2020-06-14 ENCOUNTER — Encounter (INDEPENDENT_AMBULATORY_CARE_PROVIDER_SITE_OTHER): Payer: Self-pay | Admitting: Family Medicine

## 2020-06-14 ENCOUNTER — Ambulatory Visit (INDEPENDENT_AMBULATORY_CARE_PROVIDER_SITE_OTHER): Payer: No Typology Code available for payment source | Admitting: Family Medicine

## 2020-06-14 ENCOUNTER — Other Ambulatory Visit: Payer: Self-pay

## 2020-06-14 VITALS — BP 104/57 | HR 66 | Temp 98.1°F | Ht 68.0 in | Wt 234.0 lb

## 2020-06-14 DIAGNOSIS — Z9189 Other specified personal risk factors, not elsewhere classified: Secondary | ICD-10-CM | POA: Diagnosis not present

## 2020-06-14 DIAGNOSIS — E282 Polycystic ovarian syndrome: Secondary | ICD-10-CM | POA: Diagnosis not present

## 2020-06-14 DIAGNOSIS — E559 Vitamin D deficiency, unspecified: Secondary | ICD-10-CM

## 2020-06-14 DIAGNOSIS — E66812 Obesity, class 2: Secondary | ICD-10-CM

## 2020-06-14 DIAGNOSIS — Z9884 Bariatric surgery status: Secondary | ICD-10-CM

## 2020-06-14 DIAGNOSIS — E1169 Type 2 diabetes mellitus with other specified complication: Secondary | ICD-10-CM

## 2020-06-14 DIAGNOSIS — R55 Syncope and collapse: Secondary | ICD-10-CM

## 2020-06-14 DIAGNOSIS — R001 Bradycardia, unspecified: Secondary | ICD-10-CM | POA: Diagnosis not present

## 2020-06-14 DIAGNOSIS — E119 Type 2 diabetes mellitus without complications: Secondary | ICD-10-CM

## 2020-06-14 DIAGNOSIS — Z6835 Body mass index (BMI) 35.0-35.9, adult: Secondary | ICD-10-CM

## 2020-06-14 MED ORDER — VITAMIN D (ERGOCALCIFEROL) 1.25 MG (50000 UNIT) PO CAPS: ORAL_CAPSULE | ORAL | 0 refills | Status: DC

## 2020-06-14 MED ORDER — METFORMIN HCL 500 MG PO TABS: 500.0000 mg | ORAL_TABLET | Freq: Every morning | ORAL | 0 refills | Status: DC

## 2020-06-14 MED FILL — METFORMIN HCL 500 MG TABS: 500 | 90 days supply | Qty: 90 | Fill #0

## 2020-06-14 MED FILL — VIT D2 1.25 MG (50,000 UNIT: 1.25 MG | 84 days supply | Qty: 12 | Fill #0

## 2020-06-15 NOTE — Progress Notes (Signed)
Chief Complaint:   OBESITY [name] [name] is here to discuss her progress with her obesity treatment plan along with follow-up of her obesity related diagnoses.   Today's visit was #: 12 Starting weight: 243 lbs Starting date: 06/18/2019 Today's weight: 234 lbs Today's date: 06/14/2020 Total lbs lost to date: 9 lbs Body mass index is 35.58 kg/m.  Total weight loss percentage to date: -3.70%  Interim History: [name] [name] has history of bariatric surgery and lost 110 pounds.  Her highest weight was 214, lowest was 190 pounds. Nutrition Plan: Category 2 Plan for 0% of the time. Activity: Cardio for 30 minutes 3 times per week.  Assessment/Plan:   1. PCOS (polycystic ovarian syndrome) She will continue to focus on protein-rich, low simple carbohydrate foods. We reviewed the importance of hydration, regular exercise for stress reduction, and restorative sleep.   Counseling . PCOS is a leading cause of menstrual irregularities and infertility. It is also associated with obesity, hirsutism (excessive hair growth on the face, chest, or back), and cardiovascular risk factors such as high cholesterol and insulin resistance. . Insulin resistance appears to play a central role.  . Women with PCOS have been shown to have impaired appetite-regulating hormones. . Metformin is one medication that can improve metabolic parameters.  . Women with polycystic ovary syndrome (PCOS) have an increased risk for cardiovascular disease (CVD) - European Journal of Preventive Cardiology.  2. 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  Followed by Cardiology.  3. Vitamin D deficiency Improving, but not optimized. Current vitamin D is 43.7, tested on 10/14/2019. Optimal goal > 50 ng/dL.   Plan: Continue to take prescription Vitamin D @50 ,000 IU every week as prescribed.  Follow-up for routine testing of Vitamin D, at least 2-3 times per year to avoid over-replacement.  - Refill Vitamin D,  Ergocalciferol, (DRISDOL) 1.25 MG (50000 UNIT) CAPS capsule; TAKE 1 CAPSULE BY MOUTH EVERY 7 DAYS  Dispense: 12 capsule; Refill: 0  4. Syncope [name] [name] is working on getting an implantable monitor. We will continue to monitor symptoms as they relate to her weight loss journey.  5. Type 2 diabetes mellitus with other specified complication, without long-term current use of insulin (HCC) Diabetes Mellitus: Improving.  A1c was 7.2 at its highest.  Medication: metformin 500 mg daily.  She likes sweets.  Issues reviewed: blood sugar goals, complications of diabetes mellitus, hypoglycemia prevention and treatment, exercise, and nutrition.   Plan: The importance of regular follow up with PCP and all other specialists as scheduled was stressed to patient today.  Lab Results  Component Value Date   HGBA1C 5.6 10/14/2019   HGBA1C 5.7 (H) 06/18/2019   HGBA1C 5.3 01/08/2019   Lab Results  Component Value Date   MICROALBUR 30 01/09/2017   LDLCALC 121 (H) 10/14/2019   CREATININE 0.89 06/08/2020   - Refill metFORMIN (GLUCOPHAGE) 500 MG tablet; Take 1 tablet (500 mg total) by mouth every morning.  Dispense: 90 tablet; Refill: 0  6. History of bariatric surgery Gastric sleeve.  She is taking a multivitamin, calcium, vitamin D, and biotin.  She is at risk for malnutrition due to her previous bariatric surgery.   Counseling  You may need to eat 3 meals and 2 snacks, or 5 small meals each day in order to reach your protein and calorie goals.   Allow at least 15 minutes for each meal so that you can eat mindfully. Listen to your body so that you do not overeat.  For most people, your sleeve or pouch will comfortably hold 4-6 ounces.  Eat foods from all food groups. This includes fruits and vegetables, grains, dairy, and meat and other proteins.  Include a protein-rich food at every meal and snack, and eat the protein food first.   You should be taking a Bariatric Multivitamin as well as calcium.   7.  At risk for activity intolerance Sunday was given approximately 9 minutes of counseling today regarding her increased risk for exercise intolerance.  We discussed patient's specific personal and medical issues that raise our concern.  She was advised of strategies to prevent injury and ways to improve her cardiopulmonary fitness levels slowly over time.  We additionally discussed various fitness trackers and smart phone apps to help motivate the patient to stay on track.   8. Class 2 severe obesity with serious comorbidity and body mass index (BMI) of 35.0 to 35.9 in adult, unspecified obesity type ([place])  Course: [name] [name] is currently in the action stage of change. As such, her goal is to continue with weight loss efforts.   Nutrition goals: She has agreed to keeping a food journal and adhering to recommended goals of 1200 calories and 85 grams of protein.   Exercise goals: For substantial health benefits, adults should do at least 150 minutes (2 hours and 30 minutes) a week of moderate-intensity, or 75 minutes (1 hour and 15 minutes) a week of vigorous-intensity aerobic physical activity, or an equivalent combination of moderate- and vigorous-intensity aerobic activity. Aerobic activity should be performed in episodes of at least 10 minutes, and preferably, it should be spread throughout the week.  Behavioral modification strategies: increasing lean protein intake, decreasing simple carbohydrates, increasing vegetables and increasing water intake.  [name] [name] has agreed to follow-up with our clinic in 2 weeks. She was informed of the importance of frequent follow-up visits to maximize her success with intensive lifestyle modifications for her multiple health conditions.   Objective:   Blood pressure (!) 104/57, pulse 66, temperature 98.1 F (36.7 C), temperature source Oral, height 5\' 8"  (1.727 m), weight 234 lb (106.1 kg), SpO2 99 %. Body mass index is 35.58 kg/m.  General: Cooperative, alert,  well developed, in no acute distress. HEENT: Conjunctivae and lids unremarkable. Cardiovascular: Regular rhythm.  Lungs: Normal work of breathing. Neurologic: No focal deficits.   Lab Results  Component Value Date   CREATININE 0.89 06/08/2020   BUN 14 06/08/2020   NA 137 06/08/2020   K 4.0 06/08/2020   CL 102 06/08/2020   CO2 25 06/08/2020   Lab Results  Component Value Date   ALT 10 07/20/2019   AST 13 07/20/2019   ALKPHOS 74 07/20/2019   BILITOT 0.4 07/20/2019   Lab Results  Component Value Date   HGBA1C 5.6 10/14/2019   HGBA1C 5.7 (H) 06/18/2019   HGBA1C 5.3 01/08/2019   HGBA1C 5.5 05/30/2017   HGBA1C 6.4 01/09/2017   Lab Results  Component Value Date   INSULIN 11.9 10/14/2019   INSULIN 10.1 06/18/2019   Lab Results  Component Value Date   TSH 0.764 09/30/2019   Lab Results  Component Value Date   CHOL 181 10/14/2019   HDL 44 10/14/2019   LDLCALC 121 (H) 10/14/2019   TRIG 85 10/14/2019   CHOLHDL 3.7 01/08/2019   Lab Results  Component Value Date   WBC 7.6 06/08/2020   HGB 14.0 06/08/2020   HCT 43.1 06/08/2020   MCV 88.7 06/08/2020   PLT 270 06/08/2020   Lab  Results  Component Value Date   FERRITIN 9 (L) 03/14/2016   Attestation Statements:   Reviewed by clinician on day of visit: allergies, medications, problem list, medical history, surgical history, family history, social history, and previous encounter notes.  I, Water quality scientist, CMA, am acting as transcriptionist for Briscoe Deutscher, DO  I have reviewed the above documentation for accuracy and completeness, and I agree with the above. Briscoe Deutscher, DO

## 2020-06-16 ENCOUNTER — Encounter: Payer: Self-pay | Admitting: Obstetrics and Gynecology

## 2020-06-16 ENCOUNTER — Other Ambulatory Visit: Payer: Self-pay | Admitting: Obstetrics and Gynecology

## 2020-06-16 ENCOUNTER — Other Ambulatory Visit (HOSPITAL_COMMUNITY)
Admission: RE | Admit: 2020-06-16 | Discharge: 2020-06-16 | Disposition: A | Discharge status: Home / Self Care | Payer: No Typology Code available for payment source | Source: Ambulatory Visit | Attending: Obstetrics and Gynecology | Admitting: Obstetrics and Gynecology

## 2020-06-16 ENCOUNTER — Ambulatory Visit (INDEPENDENT_AMBULATORY_CARE_PROVIDER_SITE_OTHER): Payer: No Typology Code available for payment source | Admitting: Obstetrics and Gynecology

## 2020-06-16 ENCOUNTER — Other Ambulatory Visit: Payer: Self-pay

## 2020-06-16 VITALS — BP 112/76 | HR 69 | Ht 68.0 in | Wt 240.0 lb

## 2020-06-16 DIAGNOSIS — Z113 Encounter for screening for infections with a predominantly sexual mode of transmission: Secondary | ICD-10-CM | POA: Insufficient documentation

## 2020-06-16 DIAGNOSIS — R300 Dysuria: Secondary | ICD-10-CM | POA: Diagnosis not present

## 2020-06-16 DIAGNOSIS — N898 Other specified noninflammatory disorders of vagina: Secondary | ICD-10-CM

## 2020-06-16 LAB — WET PREP FOR TRICH, YEAST, CLUE

## 2020-06-16 MED ORDER — FLUCONAZOLE 150 MG PO TABS: 150.0000 mg | ORAL_TABLET | Freq: Once | ORAL | 0 refills | Status: DC

## 2020-06-16 MED ORDER — SULFAMETHOXAZOLE-TRIMETHOPRIM 800-160 MG PO TABS: 1.0000 | ORAL_TABLET | Freq: Two times a day (BID) | ORAL | 0 refills | Status: DC

## 2020-06-16 MED FILL — SULFAMETHOXAZOLE-TMP DS TAB: 800-160 | 3 days supply | Qty: 6 | Fill #0

## 2020-06-16 MED FILL — FLUCONAZOLE 150 MG TABS: 150 | 3 days supply | Qty: 2 | Fill #0

## 2020-06-16 NOTE — Patient Instructions (Signed)
Vaginal Yeast Infection, Adult  Vaginal yeast infection is a condition that causes vaginal discharge as well as soreness, swelling, and redness (inflammation) of the vagina. This is a common condition. Some women get this infection frequently. What are the causes? This condition is caused by a change in the normal balance of the yeast (candida) and bacteria that live in the vagina. This change causes an overgrowth of yeast, which causes the inflammation. What increases the risk? The condition is more likely to develop in women who:  Take antibiotic medicines.  Have diabetes.  Take birth control pills.  Are pregnant.  Douche often.  Have a weak body defense system (immune system).  Have been taking steroid medicines for a long time.  Frequently wear tight clothing. What are the signs or symptoms? Symptoms of this condition include:  White, thick, creamy vaginal discharge.  Swelling, itching, redness, and irritation of the vagina. The lips of the vagina (vulva) may be affected as well.  Pain or a burning feeling while urinating.  Pain during sex. How is this diagnosed? This condition is diagnosed based on:  Your medical history.  A physical exam.  A pelvic exam. Your health care provider will examine a sample of your vaginal discharge under a microscope. Your health care provider may send this sample for testing to confirm the diagnosis. How is this treated? This condition is treated with medicine. Medicines may be over-the-counter or prescription. You may be told to use one or more of the following:  Medicine that is taken by mouth (orally).  Medicine that is applied as a cream (topically).  Medicine that is inserted directly into the vagina (suppository). Follow these instructions at home: Lifestyle  Do not have sex until your health care provider approves. Tell your sex partner that you have a yeast infection. That person should go to his or her health care  provider and ask if they should also be treated.  Do not wear tight clothes, such as pantyhose or tight pants.  Wear breathable cotton underwear. General instructions  Take or apply over-the-counter and prescription medicines only as told by your health care provider.  Eat more yogurt. This may help to keep your yeast infection from returning.  Do not use tampons until your health care provider approves.  Try taking a sitz bath to help with discomfort. This is a warm water bath that is taken while you are sitting down. The water should only come up to your hips and should cover your buttocks. Do this 3-4 times per day or as told by your health care provider.  Do not douche.  If you have diabetes, keep your blood sugar levels under control.  Keep all follow-up visits as told by your health care provider. This is important.   Contact a health care provider if:  You have a fever.  Your symptoms go away and then return.  Your symptoms do not get better with treatment.  Your symptoms get worse.  You have new symptoms.  You develop blisters in or around your vagina.  You have blood coming from your vagina and it is not your menstrual period.  You develop pain in your abdomen. Summary  Vaginal yeast infection is a condition that causes discharge as well as soreness, swelling, and redness (inflammation) of the vagina.  This condition is treated with medicine. Medicines may be over-the-counter or prescription.  Take or apply over-the-counter and prescription medicines only as told by your health care provider.  Do not   douche. Do not have sex or use tampons until your health care provider approves.  Contact a health care provider if your symptoms do not get better with treatment or your symptoms go away and then return. This information is not intended to replace advice given to you by your health care provider. Make sure you discuss any questions you have with your health care  provider. Document Revised: 11/21/2018 Document Reviewed: 09/09/2017 Elsevier Patient Education  2021 [place]. Urinary Tract Infection, Adult  A urinary tract infection (UTI) is an infection of any part of the urinary tract. The urinary tract includes the kidneys, ureters, bladder, and urethra. These organs make, store, and get rid of urine in the body. An upper UTI affects the ureters and kidneys. A lower UTI affects the bladder and urethra. What are the causes? Most urinary tract infections are caused by bacteria in your genital area around your urethra, where urine leaves your body. These bacteria grow and cause inflammation of your urinary tract. What increases the risk? You are more likely to develop this condition if:  You have a urinary catheter that stays in place.  You are not able to control when you urinate or have a bowel movement (incontinence).  You are female and you: ? Use a spermicide or diaphragm for birth control. ? Have low estrogen levels. ? Are pregnant.  You have certain genes that increase your risk.  You are sexually active.  You take antibiotic medicines.  You have a condition that causes your flow of urine to slow down, such as: ? An enlarged prostate, if you are female. ? Blockage in your urethra. ? A kidney stone. ? A nerve condition that affects your bladder control (neurogenic bladder). ? Not getting enough to drink, or not urinating often.  You have certain medical conditions, such as: ? Diabetes. ? A weak disease-fighting system (immunesystem). ? Sickle cell disease. ? Gout. ? Spinal cord injury. What are the signs or symptoms? Symptoms of this condition include:  Needing to urinate right away (urgency).  Frequent urination. This may include small amounts of urine each time you urinate.  Pain or burning with urination.  Blood in the urine.  Urine that smells bad or unusual.  Trouble urinating.  Cloudy urine.  Vaginal  discharge, if you are female.  Pain in the abdomen or the lower back. You may also have:  Vomiting or a decreased appetite.  Confusion.  Irritability or tiredness.  A fever or chills.  Diarrhea. The first symptom in older adults may be confusion. In some cases, they may not have any symptoms until the infection has worsened. How is this diagnosed? This condition is diagnosed based on your medical history and a physical exam. You may also have other tests, including:  Urine tests.  Blood tests.  Tests for STIs (sexually transmitted infections). If you have had more than one UTI, a cystoscopy or imaging studies may be done to determine the cause of the infections. How is this treated? Treatment for this condition includes:  Antibiotic medicine.  Over-the-counter medicines to treat discomfort.  Drinking enough water to stay hydrated. If you have frequent infections or have other conditions such as a kidney stone, you may need to see a health care provider who specializes in the urinary tract (urologist). In rare cases, urinary tract infections can cause sepsis. Sepsis is a life-threatening condition that occurs when the body responds to an infection. Sepsis is treated in the hospital with IV antibiotics, fluids,  and other medicines. Follow these instructions at home: Medicines  Take over-the-counter and prescription medicines only as told by your health care provider.  If you were prescribed an antibiotic medicine, take it as told by your health care provider. Do not stop using the antibiotic even if you start to feel better. General instructions  Make sure you: ? Empty your bladder often and completely. Do not hold urine for long periods of time. ? Empty your bladder after sex. ? Wipe from front to back after urinating or having a bowel movement if you are female. Use each tissue only one time when you wipe.  Drink enough fluid to keep your urine pale yellow.  Keep all  follow-up visits. This is important.   Contact a health care provider if:  Your symptoms do not get better after 1-2 days.  Your symptoms go away and then return. Get help right away if:  You have severe pain in your back or your lower abdomen.  You have a fever or chills.  You have nausea or vomiting. Summary  A urinary tract infection (UTI) is an infection of any part of the urinary tract, which includes the kidneys, ureters, bladder, and urethra.  Most urinary tract infections are caused by bacteria in your genital area.  Treatment for this condition often includes antibiotic medicines.  If you were prescribed an antibiotic medicine, take it as told by your health care provider. Do not stop using the antibiotic even if you start to feel better.  Keep all follow-up visits. This is important. This information is not intended to replace advice given to you by your health care provider. Make sure you discuss any questions you have with your health care provider. Document Revised: 12/04/2019 Document Reviewed: 12/04/2019 Elsevier Patient Education  [place].

## 2020-06-16 NOTE — Progress Notes (Signed)
GYNECOLOGY  VISIT   HPI: 40 y.o.   Married  Caucasian  female   [phone] with No LMP recorded. (Menstrual status: IUD).   here for vaginal itching and some dysuria.   Symptoms started 3 days ago.   Discharge and some irritation.   Some pink discharge.  Has Mirena.  Used a different body wash when traveling recently.  Hx of recurrent BV and then yeast following this. Uses Flagyl and Diflucan usually.   Had UTI once every 6 - 12 months.  Sexually active and uses condoms.  Wants STD screening.  Hx trichomonas this year.   Working at [employer].  GYNECOLOGIC HISTORY: No LMP recorded. (Menstrual status: IUD). Contraception:  Mirena 04/2016 Menopausal hormone therapy:  n/a Last mammogram: n/a Last pap smear: 03-14-16 Neg:Neg HR HPV         OB History    Gravida  3   Para  1   Term  1   Preterm      AB  2   Living  1     SAB  2   IAB      Ectopic      Multiple      Live Births  1              Patient Active Problem List   Diagnosis Date Noted  . Near syncope 11/04/2019  . Hyperlipidemia associated with type 2 diabetes mellitus ([place]) 11/04/2019  . Prediabetes 10/15/2019  . Vitamin D deficiency 08/06/2019  . Constipation 08/06/2019  . Endometriosis   . Candidiasis, cutaneous 07/14/2018  . Flu-like symptoms 07/09/2018  . Pharyngitis 07/09/2018  . GAD (generalized anxiety disorder) 06/02/2018  . Dysuria 03/27/2018  . Family history of parkinsonism 03/27/2018  . Dizziness 03/27/2018  . Bradycardia 03/23/2018  . HLD (hyperlipidemia) 03/23/2018  . Coronary atherosclerosis 03/23/2018  . Chest pain 03/22/2018  . Varicose vein of leg 12/24/2017  . Venous reflux 12/24/2017  . Leukocytosis 09/29/2017  . Nausea 09/29/2017  . Anxiety 09/29/2017  . Pancreatitis 09/28/2017  . Dehydration 03/12/2017  . 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 Sleep apnea 01/09/2017  . Healthcare maintenance 10/18/2016  . Type 2  diabetes mellitus without complication, without long-term current use of insulin ([place]) 10/18/2016  . LOW BACK PAIN, ACUTE 11/20/2006  . POLYCYSTIC OVARY 07/04/2006  . OBESITY, NOS 07/04/2006  . TOBACCO USE, QUIT 07/04/2006    Past Medical History:  Diagnosis Date  . Abnormal uterine bleeding   . Amenorrhea   . Anemia   . Anxiety    panic attacks  . Back pain   . Bradycardia   . Chest pain   . Constipation   . COVID 02/2020  . Diabetes ([place])   . Dysmenorrhea   . Endometriosis   . Gallbladder problem   . GERD (gastroesophageal reflux disease)    occasional - diet controlled and tums prn  . HLD (hyperlipidemia)   . HTN (hypertension)   . IBS (irritable bowel syndrome)   . Infertility, female   . Insulin resistance   . Migraine without aura   . Missed abortion    x 2 - both resolved without surgery  . Obesity   . PCOS (polycystic ovarian syndrome)   . PCOS (polycystic ovarian syndrome)   . PONV (postoperative nausea and vomiting)    pe rpatient ; scopalamine patch very helpful   . Sleep apnea   . SVD (spontaneous vaginal delivery)    x  1  . Urinary incontinence   . Vitamin D deficiency     Past Surgical History:  Procedure Laterality Date  . CHOLECYSTECTOMY    . COLONOSCOPY  2017   polyps  . CYSTOSCOPY N/A 10/29/2016   Procedure: CYSTOSCOPY;  Surgeon: Salvadore Dom, MD;  Location: [facility];  Service: Gynecology;  Laterality: N/A;  . DILATATION & CURRETTAGE/HYSTEROSCOPY WITH RESECTOCOPE N/A 10/17/2012   Procedure: DILATATION & CURETTAGE/HYSTEROSCOPY WITH RESECTOCOPE; Possible Polypectomy, Possible Resectoscopic Myomectomy.;  Surgeon: Floyce Stakes. Pamala Hurry, MD;  Location: [facility];  Service: Gynecology;  Laterality: N/A;  1 hr.  Marland Kitchen DILATION AND CURETTAGE OF UTERUS    . ENDOMETRIAL BIOPSY    . ENDOVENOUS ABLATION SAPHENOUS VEIN W/ LASER Right 05/08/2018   endovenous laser ablation right greater saphenous vein and stab phlebectomy > 20 incisions right leg by Deitra Mayo MD   . ESOPHAGOGASTRODUODENOSCOPY ENDOSCOPY  2017   Normal  . HYSTEROSCOPY WITH D & C N/A 12/01/2013   Procedure: IUD Removal;  Surgeon: Floyce Stakes. Pamala Hurry, MD;  Location: [facility];  Service: Gynecology;  Laterality: N/A;  90 min.  . INTRAUTERINE DEVICE (IUD) INSERTION N/A 10/17/2012   Procedure: INTRAUTERINE DEVICE (IUD) INSERTION; Mirena;  Surgeon: Floyce Stakes. Pamala Hurry, MD;  Location: [facility];  Service: Gynecology;  Laterality: N/A;  . LAPAROSCOPIC GASTRIC SLEEVE RESECTION N/A 02/19/2017   Procedure: LAPAROSCOPIC GASTRIC SLEEVE RESECTION, UPPER ENDOSCOPY;  Surgeon: Johnathan Hausen, MD;  Location: [facility];  Service: General;  Laterality: N/A;  . LAPAROSCOPIC SALPINGO OOPHERECTOMY Left 10/29/2016   Procedure: LAPAROSCOPIC SALPINGO OOPHORECTOMY With anterior uterine biopsy;  Surgeon: Salvadore Dom, MD;  Location: [facility];  Service: Gynecology;  Laterality: Left;  . LASIK  10/06/2018  . MOUTH SURGERY     wisdom teeth    Current Outpatient Medications  Medication Sig Dispense Refill  . APPLE CIDER VINEGAR PO Take 1 tablet by mouth daily.    Marland Kitchen ascorbic acid (VITAMIN C) 500 MG tablet Take 500 mg by mouth daily.    . Biotin w/ Vitamins C & E (HAIR/SKIN/NAILS PO) Take 1 tablet by mouth daily.     . Chromium Picolinate 200 MCG CAPS Take 1 capsule by mouth daily.    . clonazePAM (KLONOPIN) 0.5 MG tablet Take 1 tablet (0.5 mg total) by mouth 2 (two) times daily as needed for anxiety. 20 tablet 1  . Coenzyme Q10 (COQ10) 100 MG CAPS Take 1 capsule by mouth daily.    . cyclobenzaprine (FLEXERIL) 10 MG tablet Take 1 tablet (10 mg total) by mouth 3 (three) times daily as needed for muscle spasms. 30 tablet 0  . Docosahexaenoic Acid (DHA COMPLETE PO) Take 1 tablet by mouth daily.    . fluconazole (DIFLUCAN) 150 MG tablet Take 1 tablet (150 mg total) by mouth once for 1 dose. Take one tablet.  Repeat in 72 hours if symptoms are not completely resolved. 2 tablet 0  . levonorgestrel (MIRENA) 20 MCG/24HR  IUD 1 each by Intrauterine route once.    . loratadine (CLARITIN) 10 MG tablet Take 10 mg by mouth daily as needed for allergies.    . metFORMIN (GLUCOPHAGE) 500 MG tablet Take 1 tablet (500 mg total) by mouth every morning. 90 tablet 0  . Multiple Vitamins-Minerals (BARIATRIC MULTIVITAMINS/IRON PO) Take 1 tablet by mouth in the morning and at bedtime.    Marland Kitchen nystatin cream (MYCOSTATIN) Apply 1 application topically 2 (two) times daily. Apply to affected area BID for up to 7 days. (Patient taking differently:  Apply 1 application topically 2 (two) times daily as needed for dry skin.) 30 g 1  . ondansetron (ZOFRAN) 8 MG tablet Take 1 tablet (8 mg total) by mouth every 8 (eight) hours as needed for nausea or vomiting. 20 tablet 0  . Probiotic Product (PROBIOTIC ADVANCED PO) Take 1 capsule by mouth daily.     Marland Kitchen Spirulina 500 MG TABS Take 1 tablet by mouth daily.    Marland Kitchen sulfamethoxazole-trimethoprim (BACTRIM DS) 800-160 MG tablet Take 1 tablet by mouth 2 (two) times daily. One PO BID x 3 days 6 tablet 0  . SUMAtriptan (IMITREX) 50 MG tablet Take 1 tablet (50 mg total) by mouth every 2 (two) hours as needed for migraine. May repeat in 2 hours if headache persists or recurs. 10 tablet 0  . Vitamin D, Ergocalciferol, (DRISDOL) 1.25 MG (50000 UNIT) CAPS capsule TAKE 1 CAPSULE BY MOUTH EVERY 7 DAYS 12 capsule 0   No current facility-administered medications for this visit.     ALLERGIES: Iohexol and Pyridium [phenazopyridine hcl]  Family History  Problem Relation Age of Onset  . Diabetes Father   . Thyroid disease Father   . Hypertension Father   . Parkinson's disease Father   . Dementia Father   . Hyperlipidemia Father   . Heart disease Father   . Sleep apnea Father   . Obesity Father   . Diabetes Maternal Grandmother   . Heart disease Maternal Grandmother   . Diabetes Maternal Grandfather   . Heart disease Maternal Grandfather   . Stroke Maternal Grandfather   . Diabetes Paternal Grandmother    . Diabetes Paternal Grandfather   . Heart disease Paternal Grandfather   . Alcohol abuse Paternal Grandfather   . Hypertension Mother   . Diabetes Mother   . Depression Mother   . Obesity Mother   . Colon cancer Maternal Uncle     Social History   Socioeconomic History  . Marital status: Married    Spouse name: Theia Dezeeuw  . Number of children: 1  . Years of education: Not on file  . Highest education level: Not on file  Occupational History  . 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 Smoking status: Former Smoker    Packs/day: 0.25    Years: 19.00    Pack years: 4.75    Types: Cigarettes    Quit date: 06/06/2016    Years since quitting: 4.0  . Smokeless tobacco: Never Used  Vaping Use  . Vaping Use: Never used  Substance and Sexual Activity  . Alcohol use: Yes    Comment: 1 a month  . Drug use: No  . Sexual activity: Yes    Partners: Male    Birth control/protection: I.U.D.  Other Topics Concern  . Not on file  Social History Narrative  . Not on file   Social Determinants of Health   Financial Resource Strain: Not on file  Food Insecurity: Not on file  Transportation Needs: Not on file  Physical Activity: Not on file  Stress: Not on file  Social Connections: Not on file  Intimate Partner Violence: Not on file    Review of Systems  Genitourinary: Positive for dysuria.  All other systems reviewed and are negative.   PHYSICAL EXAMINATION:    BP 112/76 (Cuff Size: Large)   Pulse 69   Ht 5\' 8"  (1.727 m)   Wt 240 lb (108.9 kg)   SpO2 100%  BMI 36.49 kg/m     General appearance: alert, cooperative and appears stated age   Pelvic: External genitalia:  no lesions              Urethra:  normal appearing urethra with no masses, tenderness or lesions              Bartholins and Skenes: normal                 Vagina: normal appearing vagina with normal color and discharge, no lesions              Cervix: no  lesions.  IUD strings noted.  Slight pink brown blood.                 Bimanual Exam:  Uterus:  normal size, contour, position, consistency, mobility, non-tender              Adnexa: no mass, fullness, tenderness        Chaperone was present for exam.  ASSESSMENT  Dysuria.  Cystitis.  Vaginitis.   PLAN  Urinalysis:  6 - 10 WBC, 3 - 10 RBC, 6 - 10 squams, moderate bacteria, few yeast.  UC sent.  Bactrim DS po bid x 3 days. Wet prep:  Yeast present, negative for clue cells and trichomonas. Diflucan 150 g po x 1.  May repeat in 72 hours prn.  GC/CT collected today. Return for annual exam with Dr. Talbert Nan and prn.      24 min total time was spent for this patient encounter, including preparation, face-to-face counseling with the patient, coordination of care, and documentation of the encounter.

## 2020-06-17 LAB — CERVICOVAGINAL ANCILLARY ONLY
Chlamydia: NEGATIVE
Comment: NEGATIVE
Comment: NORMAL
Neisseria Gonorrhea: NEGATIVE

## 2020-06-19 LAB — URINALYSIS, COMPLETE W/RFL CULTURE
Bilirubin Urine: NEGATIVE
Glucose, UA: NEGATIVE
Hyaline Cast: NONE SEEN /LPF
Ketones, ur: NEGATIVE
Nitrites, Initial: NEGATIVE
Protein, ur: NEGATIVE
Specific Gravity, Urine: 1.015 (ref 1.001–1.03)
pH: 5.5 (ref 5.0–8.0)

## 2020-06-19 LAB — URINE CULTURE
MICRO NUMBER:: 11519355
SPECIMEN QUALITY:: ADEQUATE

## 2020-06-19 LAB — CULTURE INDICATED

## 2020-06-20 ENCOUNTER — Other Ambulatory Visit: Payer: Self-pay

## 2020-06-20 DIAGNOSIS — R001 Bradycardia, unspecified: Secondary | ICD-10-CM

## 2020-06-20 DIAGNOSIS — R55 Syncope and collapse: Secondary | ICD-10-CM

## 2020-06-21 ENCOUNTER — Other Ambulatory Visit: Payer: Self-pay

## 2020-06-21 ENCOUNTER — Observation Stay (HOSPITAL_COMMUNITY)
Admission: EM | Admit: 2020-06-21 | Discharge: 2020-06-23 | Disposition: A | Discharge status: Home / Self Care | Payer: No Typology Code available for payment source | Attending: Internal Medicine | Admitting: Internal Medicine

## 2020-06-21 ENCOUNTER — Encounter (HOSPITAL_COMMUNITY): Payer: Self-pay | Admitting: Physician Assistant

## 2020-06-21 DIAGNOSIS — E119 Type 2 diabetes mellitus without complications: Secondary | ICD-10-CM | POA: Diagnosis not present

## 2020-06-21 DIAGNOSIS — Z79899 Other long term (current) drug therapy: Secondary | ICD-10-CM | POA: Diagnosis not present

## 2020-06-21 DIAGNOSIS — R55 Syncope and collapse: Secondary | ICD-10-CM | POA: Diagnosis present

## 2020-06-21 DIAGNOSIS — Z87891 Personal history of nicotine dependence: Secondary | ICD-10-CM | POA: Diagnosis not present

## 2020-06-21 DIAGNOSIS — Z7984 Long term (current) use of oral hypoglycemic drugs: Secondary | ICD-10-CM | POA: Insufficient documentation

## 2020-06-21 DIAGNOSIS — R001 Bradycardia, unspecified: Secondary | ICD-10-CM | POA: Insufficient documentation

## 2020-06-21 DIAGNOSIS — I10 Essential (primary) hypertension: Secondary | ICD-10-CM | POA: Insufficient documentation

## 2020-06-21 DIAGNOSIS — Z20822 Contact with and (suspected) exposure to covid-19: Secondary | ICD-10-CM | POA: Diagnosis not present

## 2020-06-21 HISTORY — DX: Syncope and collapse: R55

## 2020-06-21 LAB — I-STAT BETA HCG BLOOD, ED (MC, WL, AP ONLY): I-stat hCG, quantitative: <5 m[IU]/mL (ref <5)

## 2020-06-21 LAB — CBC
HCT: 38.6 % (ref 36.0–46.0)
Hemoglobin: 13.3 g/dL (ref 12.0–15.0)
MCH: 30.1 pg (ref 26.0–34.0)
MCHC: 34.5 g/dL (ref 30.0–36.0)
MCV: 87.3 fL (ref 80.0–100.0)
Platelets: 264 10*3/uL (ref 150–400)
RBC: 4.42 MIL/uL (ref 3.87–5.11)
RDW: 12.5 % (ref 11.5–15.5)
WBC: 7.4 10*3/uL (ref 4.0–10.5)
nRBC: 0 % (ref 0.0–0.2)

## 2020-06-21 LAB — BASIC METABOLIC PANEL
Anion gap: 7 (ref 5–15)
BUN: 16 mg/dL (ref 6–20)
CO2: 24 mmol/L (ref 22–32)
Calcium: 9 mg/dL (ref 8.9–10.3)
Chloride: 106 mmol/L (ref 98–111)
Creatinine, Ser: 0.91 mg/dL (ref 0.44–1.00)
GFR, Estimated: >60 mL/min (ref >60)
Glucose, Bld: 106 mg/dL — ABNORMAL HIGH (ref 70–99)
Potassium: 4.2 mmol/L (ref 3.5–5.1)
Sodium: 137 mmol/L (ref 135–145)

## 2020-06-21 LAB — MAGNESIUM: Magnesium: 2 mg/dL (ref 1.7–2.4)

## 2020-06-21 MED ORDER — SODIUM CHLORIDE 0.9 % IV SOLN: 250.0000 mL | INTRAVENOUS | Status: DC | PRN

## 2020-06-21 MED ORDER — NITROGLYCERIN 0.4 MG SL SUBL: 0.4000 mg | SUBLINGUAL_TABLET | SUBLINGUAL | Status: DC | PRN

## 2020-06-21 MED ORDER — ZOLPIDEM TARTRATE 5 MG PO TABS: 5.0000 mg | ORAL_TABLET | Freq: Every evening | ORAL | Status: DC | PRN

## 2020-06-21 MED ORDER — ONDANSETRON HCL 4 MG/2ML IJ SOLN
4.0000 mg | Freq: Four times a day (QID) | INTRAMUSCULAR | Status: DC | PRN
  Administered 2020-06-22: 4 mg via INTRAVENOUS
  Filled 2020-06-21: qty 2

## 2020-06-21 MED ORDER — ALPRAZOLAM 0.25 MG PO TABS
0.2500 mg | ORAL_TABLET | Freq: Two times a day (BID) | ORAL | Status: DC | PRN
  Administered 2020-06-21 – 2020-06-22 (×2): 0.25 mg via ORAL
  Filled 2020-06-21 (×2): qty 1

## 2020-06-21 MED ORDER — SODIUM CHLORIDE 0.9% FLUSH
3.0000 mL | Freq: Two times a day (BID) | INTRAVENOUS | Status: DC
  Administered 2020-06-21 – 2020-06-22 (×2): 3 mL via INTRAVENOUS

## 2020-06-21 MED ORDER — SODIUM CHLORIDE 0.9% FLUSH: 3.0000 mL | INTRAVENOUS | Status: DC | PRN

## 2020-06-21 MED ORDER — ACETAMINOPHEN 325 MG PO TABS
650.0000 mg | ORAL_TABLET | ORAL | Status: DC | PRN
  Administered 2020-06-21 – 2020-06-22 (×4): 650 mg via ORAL
  Filled 2020-06-21 (×4): qty 2

## 2020-06-21 MED ORDER — ENOXAPARIN SODIUM 40 MG/0.4ML ~~LOC~~ SOLN
40.0000 mg | SUBCUTANEOUS | Status: DC
  Administered 2020-06-21 – 2020-06-22 (×2): 40 mg via SUBCUTANEOUS
  Filled 2020-06-21 (×2): qty 0.4

## 2020-06-21 NOTE — H&P (Addendum)
Cardiology Admission History and Physical:   Patient ID: [name]; MRN: [id number]; DOB: 1980/12/19   Admission date: 06/21/2020  Primary Care Provider: Lorrene Reid, PA-C Primary Cardiologist: Buford Dresser, MD 06/10/2020 Primary Electrophysiologist: None    Chief Complaint:  Syncope  Patient Profile:   [name] [name] is a 40 y.o. female with a history of presyncope and bradycardia, DM, HTN, HLD, GERD, IBS, OSA, anxiety. S/p monitor w/ sinus bradycardia but no cause for syncope, loop recorder planned.  History of Present Illness:   [name] [name] was in her usual state of health today.  She was at work.  She was sitting still, had been sitting for a while.  She had her normal breakfast.  She was not sick in any way.  She had had a reasonable amount of water to drink this morning.  She was not wearing compression stockings today, but she actually does wear them.  All of a sudden, she felt very lightheaded and dizzy and felt that she was going to pass out.  She told coworkers and they advised her to get for.  She remembers feeling the floor and then waking up with her having a coworker's letter.  Shortly after waking, she was completely oriented to self and place.  She has a headache which she always gets after the presyncopal episodes.  This is the second time she is passed out, the first time was months ago.  She has these episodes where she will get lightheaded and dizzy.  They can happen several times a day.  Not orthostatic in nature.  The episodes seem to come in clusters.  She will go months without having a, then have a 1 or 107-month.  Where she has multiple episodes, averaging more than 1 a day.  These episodes have been ongoing for several years, although she has only passed out once before.  At that time, she was at work and coworkers were checking her pulse rate after she woke up.  This was by a pulse ox machine.  They told her heart rate was in the 20s.  By EMS  arrival, her heart rate had improved.    She is followed by Dr. Harrell Gave for the episodes.  Since no cause was found on the monitor that she wore recently, a loop recorder has been scheduled for 2/28.  Currently she is resting comfortably except for a headache.   Past Medical History:  Diagnosis Date  . Abnormal uterine bleeding   . Amenorrhea   . Anemia   . Anxiety    panic attacks  . Back pain   . Bradycardia   . Chest pain   . Constipation   . COVID 02/2020  . Diabetes ([place])   . Dysmenorrhea   . Endometriosis   . Gallbladder problem   . GERD (gastroesophageal reflux disease)    occasional - diet controlled and tums prn  . HLD (hyperlipidemia)   . HTN (hypertension)   . IBS (irritable bowel syndrome)   . Infertility, female   . Insulin resistance   . Migraine without aura   . Missed abortion    x 2 - both resolved without surgery  . Obesity   . PCOS (polycystic ovarian syndrome)   . PCOS (polycystic ovarian syndrome)   . PONV (postoperative nausea and vomiting)    pe rpatient ; scopalamine patch very helpful   . Sleep apnea   . SVD (spontaneous vaginal delivery)    x 1  . Syncope  and collapse 06/21/2020  . Urinary incontinence   . Vitamin D deficiency     Past Surgical History:  Procedure Laterality Date  . CHOLECYSTECTOMY    . COLONOSCOPY  2017   polyps  . CYSTOSCOPY N/A 10/29/2016   Procedure: CYSTOSCOPY;  Surgeon: Salvadore Dom, MD;  Location: [facility];  Service: Gynecology;  Laterality: N/A;  . DILATATION & CURRETTAGE/HYSTEROSCOPY WITH RESECTOCOPE N/A 10/17/2012   Procedure: DILATATION & CURETTAGE/HYSTEROSCOPY WITH RESECTOCOPE; Possible Polypectomy, Possible Resectoscopic Myomectomy.;  Surgeon: Floyce Stakes. Pamala Hurry, MD;  Location: [facility];  Service: Gynecology;  Laterality: N/A;  1 hr.  Marland Kitchen DILATION AND CURETTAGE OF UTERUS    . ENDOMETRIAL BIOPSY    . ENDOVENOUS ABLATION SAPHENOUS VEIN W/ LASER Right 05/08/2018   endovenous laser ablation right greater  saphenous vein and stab phlebectomy > 20 incisions right leg by Deitra Mayo MD   . ESOPHAGOGASTRODUODENOSCOPY ENDOSCOPY  2017   Normal  . HYSTEROSCOPY WITH D & C N/A 12/01/2013   Procedure: IUD Removal;  Surgeon: Floyce Stakes. Pamala Hurry, MD;  Location: [facility];  Service: Gynecology;  Laterality: N/A;  90 min.  . INTRAUTERINE DEVICE (IUD) INSERTION N/A 10/17/2012   Procedure: INTRAUTERINE DEVICE (IUD) INSERTION; Mirena;  Surgeon: Floyce Stakes. Pamala Hurry, MD;  Location: [facility];  Service: Gynecology;  Laterality: N/A;  . LAPAROSCOPIC GASTRIC SLEEVE RESECTION N/A 02/19/2017   Procedure: LAPAROSCOPIC GASTRIC SLEEVE RESECTION, UPPER ENDOSCOPY;  Surgeon: Johnathan Hausen, MD;  Location: [facility];  Service: General;  Laterality: N/A;  . LAPAROSCOPIC SALPINGO OOPHERECTOMY Left 10/29/2016   Procedure: LAPAROSCOPIC SALPINGO OOPHORECTOMY With anterior uterine biopsy;  Surgeon: Salvadore Dom, MD;  Location: [facility];  Service: Gynecology;  Laterality: Left;  . LASIK  10/06/2018  . MOUTH SURGERY     wisdom teeth     Medications Prior to Admission: Prior to Admission medications   Medication Sig Start Date End Date Taking? Authorizing Provider  APPLE CIDER VINEGAR PO Take 1 tablet by mouth daily.    [provider]  ascorbic acid (VITAMIN C) 500 MG tablet Take 500 mg by mouth daily.    [provider]  Biotin w/ Vitamins C & E (HAIR/SKIN/NAILS PO) Take 1 tablet by mouth daily.     [provider]  Chromium Picolinate 200 MCG CAPS Take 1 capsule by mouth daily.    [provider]  clonazePAM (KLONOPIN) 0.5 MG tablet Take 1 tablet (0.5 mg total) by mouth 2 (two) times daily as needed for anxiety. 01/28/20   Briscoe Deutscher, DO  Coenzyme Q10 (COQ10) 100 MG CAPS Take 1 capsule by mouth daily.    [provider]  cyclobenzaprine (FLEXERIL) 10 MG tablet Take 1 tablet (10 mg total) by mouth 3 (three) times daily as needed for muscle spasms. 05/18/19   Danford, Valetta Fuller D, NP   Docosahexaenoic Acid (DHA COMPLETE PO) Take 1 tablet by mouth daily.    [provider]  levonorgestrel (MIRENA) 20 MCG/24HR IUD 1 each by Intrauterine route once.    [provider]  loratadine (CLARITIN) 10 MG tablet Take 10 mg by mouth daily as needed for allergies.    [provider]  metFORMIN (GLUCOPHAGE) 500 MG tablet Take 1 tablet (500 mg total) by mouth every morning. 06/14/20   Briscoe Deutscher, DO  Multiple Vitamins-Minerals (BARIATRIC MULTIVITAMINS/IRON PO) Take 1 tablet by mouth in the morning and at bedtime.    [provider]  nystatin cream (MYCOSTATIN) Apply 1 application topically 2 (two) times daily. Apply to  affected area BID for up to 7 days. Patient taking differently: Apply 1 application topically 2 (two) times daily as needed for dry skin. 06/04/19   Salvadore Dom, MD  ondansetron (ZOFRAN) 8 MG tablet Take 1 tablet (8 mg total) by mouth every 8 (eight) hours as needed for nausea or vomiting. 07/09/18   Danford, Valetta Fuller D, NP  Probiotic Product (PROBIOTIC ADVANCED PO) Take 1 capsule by mouth daily.     [provider]  Spirulina 500 MG TABS Take 1 tablet by mouth daily.    [provider]  sulfamethoxazole-trimethoprim (BACTRIM DS) 800-160 MG tablet Take 1 tablet by mouth 2 (two) times daily. One PO BID x 3 days 06/16/20   Yisroel Ramming, Everardo All, MD  SUMAtriptan (IMITREX) 50 MG tablet Take 1 tablet (50 mg total) by mouth every 2 (two) hours as needed for migraine. May repeat in 2 hours if headache persists or recurs. 03/02/20   Lorrene Reid, PA-C  Vitamin D, Ergocalciferol, (DRISDOL) 1.25 MG (50000 UNIT) CAPS capsule TAKE 1 CAPSULE BY MOUTH EVERY 7 DAYS 06/14/20   Briscoe Deutscher, DO     Allergies:    Allergies  Allergen Reactions  . Iohexol Hives     Code: HIVES, Desc: *NEED 13 HR PREDISONE AND BENADRYL  BE FOR IV CONTRAST GIVEN* /DR OLSON PT DEVELOPED HIVES AND ROOF OF MOUTH BEGIN "ITCHING"", Onset Date: [id number]   .  Pyridium [Phenazopyridine Hcl]     vaginitis symptoms when on it    Social History:   Social History   Socioeconomic History  . Marital status: Married    Spouse name: Staceyann Knouff  . Number of children: 1  . Years of education: Not on file  . Highest education level: Not on file  Occupational History  . 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 Smoking status: Former Smoker    Packs/day: 0.25    Years: 19.00    Pack years: 4.75    Types: Cigarettes    Quit date: 06/06/2016    Years since quitting: 4.0  . Smokeless tobacco: Never Used  Vaping Use  . Vaping Use: Never used  Substance and Sexual Activity  . Alcohol use: Yes    Comment: 1 a month  . Drug use: No  . Sexual activity: Yes    Partners: Male    Birth control/protection: I.U.D.  Other Topics Concern  . Not on file  Social History Narrative  . Not on file   Social Determinants of Health   Financial Resource Strain: Not on file  Food Insecurity: Not on file  Transportation Needs: Not on file  Physical Activity: Not on file  Stress: Not on file  Social Connections: Not on file  Intimate Partner Violence: Not on file    Family History:  The patient's family history includes Alcohol abuse in her paternal grandfather; Colon cancer in her maternal uncle; Dementia in her father; Depression in her mother; Diabetes in her father, maternal grandfather, maternal grandmother, mother, paternal grandfather, and paternal grandmother; Heart disease in her father, maternal grandfather, maternal grandmother, and paternal grandfather; Hyperlipidemia in her father; Hypertension in her father and mother; Obesity in her father and mother; Parkinson's disease in her father; Sleep apnea in her father; Stroke in her maternal grandfather; Thyroid disease in her father.   The patient She indicated that her mother is alive. She indicated that her father is alive. She indicated that her maternal  grandmother is deceased. She indicated that her maternal grandfather is deceased. She indicated that her paternal grandmother is deceased. She indicated that her paternal grandfather is deceased. She indicated that the status of her maternal uncle is unknown.   ROS:  Please see the history of present illness.  All other ROS reviewed and negative.     Physical Exam/Data:   Vitals:   06/21/20 0915 06/21/20 0930 06/21/20 1030 06/21/20 1115  BP: 95/60 (!) 90/46 (!) 102/54 103/62  Pulse: (!) 51 (!) 51 (!) 47 (!) 45  Resp: 16 16 (!) 22 16  Temp:      TempSrc:      SpO2: 97% 96% 99% 100%  Weight:      Height:       No intake or output data in the 24 hours ending 06/21/20 1220 Filed Weights   06/21/20 0832  Weight: 108 kg   Body mass index is 36.19 kg/m.  General:  Well nourished, well developed, female in no acute distress HEENT: normal Lymph: no adenopathy Neck:  JVD not elevated Endocrine:  No thryomegaly Vascular: No carotid bruits; 4/4 extremity pulses 2+ bilaterally  Cardiac:  normal S1, S2; RRR; no murmur, no rub or gallop  Lungs:  clear to auscultation bilaterally, no wheezing, rhonchi or rales  Abd: soft, nontender, no hepatomegaly  Ext: no edema Musculoskeletal:  No deformities, BUE and BLE strength normal and equal Skin: warm and dry  Neuro:  CNs 2-12 intact, no focal abnormalities noted Psych:  Normal affect    EKG:  ECG was personally reviewed: Sinus brady, HR 47, inc RBBB is old Telemetry: SR, S brady in the 40s  Relevant CV Studies:  ECHO: 03/2018 - Left ventricle: The cavity size was normal. Systolic function was  normal. The estimated ejection fraction was in the range of 55%  to 60%. Wall motion was normal; there were no regional wall  motion abnormalities. Left ventricular diastolic function  parameters were normal.  - Left atrium: The atrium was mildly dilated.  - Atrial septum: No defect or patent foramen ovale was identified.   MONITOR:  11/2019  Patient had a min HR of 37 bpm, max HR of 180 bpm, and avg HR of 67 bpm. Predominant underlying rhythm was Sinus Rhythm. Isolated SVEs were rare (<1.0%), and no SVE Couplets or SVE Triplets were present. Isolated VEs were rare (<1.0%), and no VE Couplets or VE Triplets were present.  Laboratory Data:  Chemistry Recent Labs  Lab 06/21/20 1005  NA 137  K 4.2  CL 106  CO2 24  GLUCOSE 106*  BUN 16  CREATININE 0.91  CALCIUM 9.0  GFRNONAA >60  ANIONGAP 7    No results for input(s): PROT, ALBUMIN, AST, ALT, ALKPHOS, BILITOT in the last 168 hours. Hematology Recent Labs  Lab 06/21/20 1005  WBC 7.4  RBC 4.42  HGB 13.3  HCT 38.6  MCV 87.3  MCH 30.1  MCHC 34.5  RDW 12.5  PLT 264   Cardiac Enzymes  High Sensitivity Troponin:   Recent Labs  Lab 06/08/20 0645 06/08/20 1004  TROPONINIHS 2 <2     BNPNo results for input(s): BNP, PROBNP in the last 168 hours.  DDimer No results for input(s): DDIMER in the last 168 hours. Lipids:  Lab Results  Component Value Date   CHOL 181 10/14/2019   HDL 44 10/14/2019   LDLCALC 121 (H) 10/14/2019   TRIG 85 10/14/2019   CHOLHDL 3.7 01/08/2019   INR:  Lab Results  Component Value Date   INR 0.9 09/05/2007   A1c:  Lab Results  Component Value Date   HGBA1C 5.6 10/14/2019   Thyroid:  Lab Results  Component Value Date   TSH 0.764 09/30/2019   T3TOTAL 115 06/18/2019    Radiology/Studies:  No results found.  Assessment and Plan:   1.  Syncope and collapse -She has a history of sinus bradycardia in the 40s -She is on no rate lowering medications, no known vagal stimuli, no acute illnesses or problems -Syncope is not orthostatic in nature. -Follow on telemetry, check an echo, check a TSH -Consult EP in a.m. for either a loop recorder or pacemaker if indicated.  Principal Problem:   Syncope and collapse     For questions or updates, please contact [facility] Please consult [email or web address] for contact  info under Cardiology/STEMI.    Signed, Rosaria Ferries, PA-C  06/21/2020 12:20 PM   Patient seen and examined with Rosaria Ferries PA-C.  Agree as above, with the following exceptions and changes as noted below. 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 This is her second lifetime syncopal episode.  She follows closely with Dr. Buford Dresser and pursues conservative measures including salt liberalization, compression stockings, and adequate hydration.    Gen: NAD, CV: Regular rhythm bradycardic with significant sinus arrhythmia, no murmurs, Lungs: clear, Abd: soft, Extrem: Warm, well perfused, no edema, Neuro/Psych: alert and oriented x 3, normal mood and affect. All available labs, radiology testing, previous records reviewed.   She has a structurally normal echo from 2019.  I have independently reviewed these images and do not see clear evidence of infiltrative process-she has a normal global longitudinal strain which is reassuring.  LV function and wall motion appear normal.  No evidence of LVOT obstruction on this echocardiogram either.  Since it has been 3 years I would recommend repeating the echocardiogram in the setting of syncope.  She denies surreptitious drug use as a source of bradycardia and syncope.  Her EKG shows sinus rhythm with grossly normal intervals and no obvious fractionation of the QRS.  She was intended to have a loop recorder implanted at the end of this month.  I discussed with electrophysiology that they may want to evaluate her for loop implant now.  I would recommend overnight hospital observation on telemetry and if no findings are documented, consider loop implant tomorrow, if indicated per EP.  We will repeat an echocardiogram to evaluate for any evolution of structural issues.  In a young patient with syncope and bradycardia, would want to exclude a granulomatous  process such as cardiac sarcoid.  We may even want to consider a cardiac MRI inpatient prior to loop implant.  Look forward to the thoughts from EP as well.  Elouise Munroe, MD 06/21/20 1:01 PM

## 2020-06-21 NOTE — ED Notes (Signed)
Walked patient to the bathroom patient did well put patient back on the monitor patient is resting with call bell in reach

## 2020-06-21 NOTE — Consult Note (Addendum)
Cardiology Consultation:   [name] MRN: [id number]; DOB: 02-20-1981  Admit date: 06/21/2020 Date of Consult: 06/21/2020  PCP:  [name] [name], [place]  Cardiologist:  [name] Dresser, MD  Electrophysiologist:  Dr. Rayann [name] (pending)  Patient Profile:   [name] [name] is a 40 y.o. female with a hx of PCOS, obesity s/p bariatric surgery, fomer smoker who is being seen today for the evaluation of recurrent near syncope, syncope at the request of Dr. Margaretann [name].  History of Present Illness:   [name] [name] saw [name] [name] Gave 06/10/20, she has hx of chronic dizziness and recurrent near syncope/syncope. There have been self observed HRs as low as 29-30's  She noted review of Reviewed 30 day monitor from 04/2018 and more recent monitor from 11/2019. No significant pauses or arrhythmias. Symptoms did not correlate to abnormal heart rhythms. Normal heart rate variability, augments appropriately with activity. Orthostatics done 10/2019 with appropriate elevation in HR with standing without drop in blood pressure She was doing well until COVID infection October Subsequently having recurrent symptoms, at her visit 06/10/20 mentionined HR 20's-80's, always with prdrome and discussed loop implant She has an appt to see [name] [name] 07/04/20 for consult and possible loop.  LABS K+ 4.2 BUN/Creat 16/0.91 Mag 2.0 WBC 7.4 H/H 13/36 Plts 264  COVID pending  Past Medical History:  Diagnosis Date  . Abnormal uterine bleeding   . Amenorrhea   . Anemia   . Anxiety    panic attacks  . Back pain   . Bradycardia   . Chest pain   . Constipation   . COVID 02/2020  . Diabetes ([place])   . Dysmenorrhea   . Endometriosis   . Gallbladder problem   . GERD (gastroesophageal reflux disease)    occasional - diet controlled and tums prn  . HLD (hyperlipidemia)   . HTN (hypertension)   . IBS (irritable bowel syndrome)   . Infertility, female   . Insulin  resistance   . Migraine without aura   . Missed abortion    x 2 - both resolved without surgery  . Obesity   . PCOS (polycystic ovarian syndrome)   . PCOS (polycystic ovarian syndrome)   . PONV (postoperative nausea and vomiting)    pe rpatient ; scopalamine patch very helpful   . Sleep apnea   . SVD (spontaneous vaginal delivery)    x 1  . Syncope and collapse 06/21/2020  . Urinary incontinence   . Vitamin D deficiency     Past Surgical History:  Procedure Laterality Date  . CHOLECYSTECTOMY    . COLONOSCOPY  2017   polyps  . CYSTOSCOPY N/A 10/29/2016   Procedure: CYSTOSCOPY;  Surgeon: Salvadore Dom, MD;  Location: [facility];  Service: Gynecology;  Laterality: N/A;  . DILATATION & CURRETTAGE/HYSTEROSCOPY WITH RESECTOCOPE N/A 10/17/2012   Procedure: DILATATION & CURETTAGE/HYSTEROSCOPY WITH RESECTOCOPE; Possible Polypectomy, Possible Resectoscopic Myomectomy.;  Surgeon: Floyce Stakes. Pamala Hurry, MD;  Location: [facility];  Service: Gynecology;  Laterality: N/A;  1 hr.  Marland Kitchen DILATION AND CURETTAGE OF UTERUS    . ENDOMETRIAL BIOPSY    . ENDOVENOUS ABLATION SAPHENOUS VEIN W/ LASER Right 05/08/2018   endovenous laser ablation right greater saphenous vein and stab phlebectomy > 20 incisions right leg by Deitra Mayo MD   . ESOPHAGOGASTRODUODENOSCOPY ENDOSCOPY  2017   Normal  . HYSTEROSCOPY WITH D & C N/A 12/01/2013   Procedure: IUD Removal;  Surgeon: Floyce Stakes. Pamala Hurry, MD;  Location: [facility];  Service: Gynecology;  Laterality: N/A;  90 min.  . INTRAUTERINE DEVICE (IUD) INSERTION N/A 10/17/2012   Procedure: INTRAUTERINE DEVICE (IUD) INSERTION; Mirena;  Surgeon: Floyce Stakes. Pamala Hurry, MD;  Location: [facility];  Service: Gynecology;  Laterality: N/A;  . LAPAROSCOPIC GASTRIC SLEEVE RESECTION N/A 02/19/2017   Procedure: LAPAROSCOPIC GASTRIC SLEEVE RESECTION, UPPER ENDOSCOPY;  Surgeon: Johnathan Hausen, MD;  Location: [facility];  Service: General;  Laterality: N/A;  . LAPAROSCOPIC SALPINGO OOPHERECTOMY Left  10/29/2016   Procedure: LAPAROSCOPIC SALPINGO OOPHORECTOMY With anterior uterine biopsy;  Surgeon: Salvadore Dom, MD;  Location: [facility];  Service: Gynecology;  Laterality: Left;  . LASIK  10/06/2018  . MOUTH SURGERY     wisdom teeth     Home Medications:  Prior to Admission medications   Medication Sig Start Date End Date Taking? Authorizing Provider  APPLE CIDER VINEGAR PO Take 1 tablet by mouth daily.    [provider]  ascorbic acid (VITAMIN C) 500 MG tablet Take 500 mg by mouth daily.    [provider]  Biotin w/ Vitamins C & E (HAIR/SKIN/NAILS PO) Take 1 tablet by mouth daily.     [provider]  Chromium Picolinate 200 MCG CAPS Take 1 capsule by mouth daily.    [provider]  clonazePAM (KLONOPIN) 0.5 MG tablet Take 1 tablet (0.5 mg total) by mouth 2 (two) times daily as needed for anxiety. 01/28/20   Briscoe Deutscher, DO  Coenzyme Q10 (COQ10) 100 MG CAPS Take 1 capsule by mouth daily.    [provider]  cyclobenzaprine (FLEXERIL) 10 MG tablet Take 1 tablet (10 mg total) by mouth 3 (three) times daily as needed for muscle spasms. 05/18/19   Danford, Valetta Fuller D, NP  Docosahexaenoic Acid (DHA COMPLETE PO) Take 1 tablet by mouth daily.    [provider]  levonorgestrel (MIRENA) 20 MCG/24HR IUD 1 each by Intrauterine route once.    [provider]  loratadine (CLARITIN) 10 MG tablet Take 10 mg by mouth daily as needed for allergies.    [provider]  metFORMIN (GLUCOPHAGE) 500 MG tablet Take 1 tablet (500 mg total) by mouth every morning. 06/14/20   Briscoe Deutscher, DO  Multiple Vitamins-Minerals (BARIATRIC MULTIVITAMINS/IRON PO) Take 1 tablet by mouth in the morning and at bedtime.    [provider]  nystatin cream (MYCOSTATIN) Apply 1 application topically 2 (two) times daily. Apply to affected area BID for up to 7 days. Patient taking differently: Apply 1 application topically 2 (two) times daily as  needed for dry skin. 06/04/19   Salvadore Dom, MD  ondansetron (ZOFRAN) 8 MG tablet Take 1 tablet (8 mg total) by mouth every 8 (eight) hours as needed for nausea or vomiting. 07/09/18   Danford, Valetta Fuller D, NP  Probiotic Product (PROBIOTIC ADVANCED PO) Take 1 capsule by mouth daily.     [provider]  Spirulina 500 MG TABS Take 1 tablet by mouth daily.    [provider]  sulfamethoxazole-trimethoprim (BACTRIM DS) 800-160 MG tablet Take 1 tablet by mouth 2 (two) times daily. One PO BID x 3 days 06/16/20   Yisroel Ramming, Everardo All, MD  SUMAtriptan (IMITREX) 50 MG tablet Take 1 tablet (50 mg total) by mouth every 2 (two) hours as needed for migraine. May repeat in 2 hours if headache persists or recurs. 03/02/20   [name] Reid, PA-C  Vitamin D, Ergocalciferol, (DRISDOL) 1.25 MG (50000 UNIT) CAPS capsule TAKE 1 CAPSULE  BY MOUTH EVERY 7 DAYS 06/14/20   Briscoe Deutscher, DO    Inpatient Medications: Scheduled Meds:  Continuous Infusions:  PRN Meds: acetaminophen, ALPRAZolam, ondansetron (ZOFRAN) IV, zolpidem  Allergies:    Allergies  Allergen Reactions  . Iohexol Hives     Code: HIVES, Desc: *NEED 13 HR PREDISONE AND BENADRYL  BE FOR IV CONTRAST GIVEN* /DR OLSON PT DEVELOPED HIVES AND ROOF OF MOUTH BEGIN "ITCHING"", Onset Date: [id number]   . Pyridium [Phenazopyridine Hcl]     vaginitis symptoms when on it    Social History:   Social History   Socioeconomic History  . Marital status: Married    Spouse name: Keryl Gholson  . Number of children: 1  . Years of education: Not on file  . Highest education level: Not on file  Occupational History  . 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 Smoking status: Former Smoker    Packs/day: 0.25    Years: 19.00    Pack years: 4.75    Types: Cigarettes    Quit date: 06/06/2016    Years since quitting: 4.0  . Smokeless tobacco: Never Used  Vaping Use  . Vaping Use: Never used   Substance and Sexual Activity  . Alcohol use: Yes    Comment: 1 a month  . Drug use: No  . Sexual activity: Yes    Partners: Male    Birth control/protection: I.U.D.  Other Topics Concern  . Not on file  Social History Narrative  . Not on file   Social Determinants of Health   Financial Resource Strain: Not on file  Food Insecurity: Not on file  Transportation Needs: Not on file  Physical Activity: Not on file  Stress: Not on file  Social Connections: Not on file  Intimate Partner Violence: Not on file    Family History:   Family History  Problem Relation Age of Onset  . Diabetes Father   . Thyroid disease Father   . Hypertension Father   . Parkinson's disease Father   . Dementia Father   . Hyperlipidemia Father   . Heart disease Father   . Sleep apnea Father   . Obesity Father   . Diabetes Maternal Grandmother   . Heart disease Maternal Grandmother   . Diabetes Maternal Grandfather   . Heart disease Maternal Grandfather   . Stroke Maternal Grandfather   . Diabetes Paternal Grandmother   . Diabetes Paternal Grandfather   . Heart disease Paternal Grandfather   . Alcohol abuse Paternal Grandfather   . Hypertension Mother   . Diabetes Mother   . Depression Mother   . Obesity Mother   . Colon cancer Maternal Uncle      ROS:  Please see the history of present illness.  All other ROS reviewed and negative.     Physical Exam/Data:   Vitals:   06/21/20 0915 06/21/20 0930 06/21/20 1030 06/21/20 1115  BP: 95/60 (!) 90/46 (!) 102/54 103/62  Pulse: (!) 51 (!) 51 (!) 47 (!) 45  Resp: 16 16 (!) 22 16  Temp:      TempSrc:      SpO2: 97% 96% 99% 100%  Weight:      Height:       No intake or output data in the 24 hours ending 06/21/20 1227 Last 3 Weights 06/21/2020 06/16/2020 06/14/2020  Weight (lbs) 238 lb 240 lb 234 lb  Weight (kg) 107.956 kg 108.863 kg 106.142 kg  Body mass index is 36.19 kg/m.  General:  Well nourished, well developed, in no acute  distress HEENT: normal Lymph: no adenopathy Neck: no JVD Endocrine:  No thryomegaly Vascular: No carotid bruits Cardiac:  RRR; no murmurs, gallops or rubs Lungs:  CTA b/l, no wheezing, rhonchi or rales  Abd: soft, nontender Ext: no edema Musculoskeletal:  No deformitie Skin: warm and dry  Neuro:  no focal abnormalities noted Psych:  Normal affect   EKG:  The EKG was personally reviewed and demonstrates:   SB 59bpm, normal intervals  10/08/19 SB 47  Telemetry:  Telemetry was personally reviewed and demonstrates:   SB 45-60's  Relevant CV Studies:  July 20201 12 days of data recorded on Zio monitor. Patient had a min HR of 37 bpm, max HR of 180 bpm, and avg HR of 67 bpm. Predominant underlying rhythm was Sinus Rhythm. No VT, SVT, atrial fibrillation, high degree block, or pauses noted. Isolated atrial and ventricular ectopy was rare (<1%). There were 91 triggered events. These were all sinus, with occasional isolate PAC or PVC. No significant arrhythmias detected.   03/23/2018 IMPRESSION: 1. Coronary calcium score of 46. This was 34 percentile for age and sex matched control.  2. Normal coronary origin with right dominance.  3. 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 Aggressive medical management is recommended.  4. Mildly dilated pulmonary artery measuring 32 mm suggestive of pulmonary hypertension.   03/23/2018 Study Conclusions  - Left ventricle: The cavity size was normal. Systolic function was  normal. The estimated ejection fraction was in the range of 55%  to 60%. Wall motion was normal; there were no regional wall  motion abnormalities. Left ventricular diastolic function  parameters were normal.  - Left atrium: The atrium was mildly dilated.  - Atrial septum: No defect or patent foramen ovale was identified.   Laboratory Data:  High Sensitivity Troponin:   Recent Labs  Lab 06/08/20 0645 06/08/20 1004  TROPONINIHS 2 <2      Chemistry Recent Labs  Lab 06/21/20 1005  NA 137  K 4.2  CL 106  CO2 24  GLUCOSE 106*  BUN 16  CREATININE 0.91  CALCIUM 9.0  GFRNONAA >60  ANIONGAP 7    No results for input(s): PROT, ALBUMIN, AST, ALT, ALKPHOS, BILITOT in the last 168 hours. Hematology Recent Labs  Lab 06/21/20 1005  WBC 7.4  RBC 4.42  HGB 13.3  HCT 38.6  MCV 87.3  MCH 30.1  MCHC 34.5  RDW 12.5  PLT 264   BNPNo results for input(s): BNP, PROBNP in the last 168 hours.  DDimer No results for input(s): DDIMER in the last 168 hours.   Radiology/Studies:  No results found.   Assessment and Plan:   1. Recurrent near syncope 2. 1st syncopal event  Symptoms started back in 2019 though seemed to be less eever and went away for a couple years Started back up in July and on the last few months especially worse again    All her events happen with a similar set of symptoms She has been standing or seated, never occurred supine She will start to feel lightheaded, then very weak, dizzy, en often near syncopale and usually able to lay down if getting very weak before she has fainted until today  She works in a medical office and in January via automatic cuff SDP 80's and her HR varied 72, 28, 60, 57, 29 via pulse ox After a few minutes she says  her BP recovered to 125/86 but HR by pulse ox stayed in the 20's? Today at work Unable to get vitals until she was better, but says that when they tried to sit her up in the chair she became near syncopal again and and then again a 3rd time, and by the time they could get her vitals BP was ok and again by pulse ox HR reportedly 20's  These seem to come in clusters, not associated with menses (has IUD) She is often nauseous and very weak afterwards She is reported as looking pale with the events. She feels very poorly afterwards for "a long time"  Discussed that while the pulse ox offers some information,though is not definitive Given very young age, prefer to  plan loop implant to have better information and proof prior to implant. She is on board with that plan of care  Dr. Rayann [name] has seen her she has been admitted for observation, will monitor her telemetry ofvernight if nothing noted, plan for loop implant in the morning      Risk Assessment/Risk Scores:  { For questions or updates, please contact [facility] Please consult [email or web address] for contact info under    Signed, Baldwin Jamaica, PA-C  06/21/2020 12:27 PM   I have seen, examined the patient, and reviewed the above assessment and plan.  Changes to above are made where necessary.  On exam, RRR.  Very difficult situation.  I suspect sinus node dysfunction, likely related to vagal tone and prior gastric surgery.  Given her very young age, we should avoid pacing if possible.  She is in agreement with this. She is being observed overnight by general cardiology team. We will update echo. Plan ILR tomorrow if no arrhythmias to explain episodes are observed overnight. Risks and benefits to ILR were discussed with the patient who wishes to proceed.  Co Sign: Thompson Grayer, MD 06/21/2020 2:03 PM

## 2020-06-21 NOTE — ED Notes (Signed)
Help get patient on the monitor did ekg shown to Dr Delo patient is resting with call bell in reach 

## 2020-06-21 NOTE — ED Notes (Addendum)
Pt resting comfortably. Visitor at bedside. Pt instructed to call RN right away when she feels dizzy. No pain and no questions at this time. Pt aware of plan to have loop recorder placed tomorrow.

## 2020-06-21 NOTE — ED Triage Notes (Signed)
BIB EMS from work for recurring episodes of bradycardia and syncope. Today's episode she felt it coming on with dizziness and sat on the floor, no fall. When she woke she was lying in someone's lap. Nausea and headache following episode. Is being followed by a cardiologist. Scheduled for loop recorder surgery on 28th. Back to baseline now. Cards said if she blacked out, come to the ED.

## 2020-06-21 NOTE — ED Notes (Signed)
Walked patient to the bathroom patient did well patient is now back in bed on he monitor with call bell in reach

## 2020-06-21 NOTE — ED Provider Notes (Signed)
[facility] EMERGENCY DEPARTMENT Provider Note   CSN: [id number] Arrival date & time: 06/21/20  8341     History Chief Complaint  Patient presents with  . Near Syncope    [name] [name] is a 40 y.o. female.  Patient with history of near syncope and bradycardia presents the emergency department after having a syncopal spell today.  Patient is followed by Dr. Harrell Gave.  She has had monitoring in the past which did not demonstrate a specific underlying cause of her symptoms.  These may be vasovagal, she reports that at times when she has lightheaded spells, her heart rate will jump into the 20s.  Today she was at work talking to a coworker when she became lightheaded.  Prodrome similar to previous episodes.  She sat down on the floor and then had an episode of full syncope.  She woke up in a coworker's lap and remembers them telling her to breathe.  After a few minutes she returned to her baseline.  She did have some mild chest discomfort which resolved.  Sometimes she will have nausea and vomiting after the spells, only nausea and a headache today.  She is scheduled for loop recorder implantation on 2/28.  She states that she has been eating and drinking well without any recent episodes of vomiting or diarrhea.  No urinary symptoms.  Today's episode was different given the fact that she has never had full syncope in the past.        Past Medical History:  Diagnosis Date  . Abnormal uterine bleeding   . Amenorrhea   . Anemia   . Anxiety    panic attacks  . Back pain   . Bradycardia   . Chest pain   . Constipation   . COVID 02/2020  . Diabetes ([place])   . Dysmenorrhea   . Endometriosis   . Gallbladder problem   . GERD (gastroesophageal reflux disease)    occasional - diet controlled and tums prn  . HLD (hyperlipidemia)   . HTN (hypertension)   . IBS (irritable bowel syndrome)   . Infertility, female   . Insulin resistance   . Migraine without aura   .  Missed abortion    x 2 - both resolved without surgery  . Obesity   . PCOS (polycystic ovarian syndrome)   . PCOS (polycystic ovarian syndrome)   . PONV (postoperative nausea and vomiting)    pe rpatient ; scopalamine patch very helpful   . Sleep apnea   . SVD (spontaneous vaginal delivery)    x 1  . Urinary incontinence   . Vitamin D deficiency     Patient Active Problem List   Diagnosis Date Noted  . Near syncope 11/04/2019  . Hyperlipidemia associated with type 2 diabetes mellitus ([place]) 11/04/2019  . Prediabetes 10/15/2019  . Vitamin D deficiency 08/06/2019  . Constipation 08/06/2019  . Endometriosis   . Candidiasis, cutaneous 07/14/2018  . Flu-like symptoms 07/09/2018  . Pharyngitis 07/09/2018  . GAD (generalized anxiety disorder) 06/02/2018  . Dysuria 03/27/2018  . Family history of parkinsonism 03/27/2018  . Dizziness 03/27/2018  . Bradycardia 03/23/2018  . HLD (hyperlipidemia) 03/23/2018  . Coronary atherosclerosis 03/23/2018  . Chest pain 03/22/2018  . Varicose vein of leg 12/24/2017  . Venous reflux 12/24/2017  . Leukocytosis 09/29/2017  . Nausea 09/29/2017  . Anxiety 09/29/2017  . Pancreatitis 09/28/2017  . Dehydration 03/12/2017  . 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 Sleep apnea 01/09/2017  .  Healthcare maintenance 10/18/2016  . Type 2 diabetes mellitus without complication, without long-term current use of insulin ([place]) 10/18/2016  . LOW BACK PAIN, ACUTE 11/20/2006  . POLYCYSTIC OVARY 07/04/2006  . OBESITY, NOS 07/04/2006  . TOBACCO USE, QUIT 07/04/2006    Past Surgical History:  Procedure Laterality Date  . CHOLECYSTECTOMY    . COLONOSCOPY  2017   polyps  . CYSTOSCOPY N/A 10/29/2016   Procedure: CYSTOSCOPY;  Surgeon: Salvadore Dom, MD;  Location: [facility];  Service: Gynecology;  Laterality: N/A;  . DILATATION & CURRETTAGE/HYSTEROSCOPY WITH RESECTOCOPE N/A 10/17/2012   Procedure: DILATATION & CURETTAGE/HYSTEROSCOPY WITH  RESECTOCOPE; Possible Polypectomy, Possible Resectoscopic Myomectomy.;  Surgeon: Floyce Stakes. Pamala Hurry, MD;  Location: [facility];  Service: Gynecology;  Laterality: N/A;  1 hr.  Marland Kitchen DILATION AND CURETTAGE OF UTERUS    . ENDOMETRIAL BIOPSY    . ENDOVENOUS ABLATION SAPHENOUS VEIN W/ LASER Right 05/08/2018   endovenous laser ablation right greater saphenous vein and stab phlebectomy > 20 incisions right leg by Deitra Mayo MD   . ESOPHAGOGASTRODUODENOSCOPY ENDOSCOPY  2017   Normal  . HYSTEROSCOPY WITH D & C N/A 12/01/2013   Procedure: IUD Removal;  Surgeon: Floyce Stakes. Pamala Hurry, MD;  Location: [facility];  Service: Gynecology;  Laterality: N/A;  90 min.  . INTRAUTERINE DEVICE (IUD) INSERTION N/A 10/17/2012   Procedure: INTRAUTERINE DEVICE (IUD) INSERTION; Mirena;  Surgeon: Floyce Stakes. Pamala Hurry, MD;  Location: [facility];  Service: Gynecology;  Laterality: N/A;  . LAPAROSCOPIC GASTRIC SLEEVE RESECTION N/A 02/19/2017   Procedure: LAPAROSCOPIC GASTRIC SLEEVE RESECTION, UPPER ENDOSCOPY;  Surgeon: Johnathan Hausen, MD;  Location: [facility];  Service: General;  Laterality: N/A;  . LAPAROSCOPIC SALPINGO OOPHERECTOMY Left 10/29/2016   Procedure: LAPAROSCOPIC SALPINGO OOPHORECTOMY With anterior uterine biopsy;  Surgeon: Salvadore Dom, MD;  Location: [facility];  Service: Gynecology;  Laterality: Left;  . LASIK  10/06/2018  . MOUTH SURGERY     wisdom teeth     OB History    Gravida  3   Para  1   Term  1   Preterm      AB  2   Living  1     SAB  2   IAB      Ectopic      Multiple      Live Births  1           Family History  Problem Relation Age of Onset  . Diabetes Father   . Thyroid disease Father   . Hypertension Father   . Parkinson's disease Father   . Dementia Father   . Hyperlipidemia Father   . Heart disease Father   . Sleep apnea Father   . Obesity Father   . Diabetes Maternal Grandmother   . Heart disease Maternal Grandmother   . Diabetes Maternal Grandfather   . Heart disease  Maternal Grandfather   . Stroke Maternal Grandfather   . Diabetes Paternal Grandmother   . Diabetes Paternal Grandfather   . Heart disease Paternal Grandfather   . Alcohol abuse Paternal Grandfather   . Hypertension Mother   . Diabetes Mother   . Depression Mother   . Obesity Mother   . Colon cancer Maternal Uncle     Social History   Tobacco Use  . Smoking status: Former Smoker    Packs/day: 0.25    Years: 19.00    Pack years: 4.75    Types: Cigarettes    Quit date: 06/06/2016  Years since quitting: 4.0  . Smokeless tobacco: Never Used  Vaping Use  . Vaping Use: Never used  Substance Use Topics  . Alcohol use: Yes    Comment: 1 a month  . Drug use: No    Home Medications Prior to Admission medications   Medication Sig Start Date End Date Taking? Authorizing Provider  APPLE CIDER VINEGAR PO Take 1 tablet by mouth daily.    [provider]  ascorbic acid (VITAMIN C) 500 MG tablet Take 500 mg by mouth daily.    [provider]  Biotin w/ Vitamins C & E (HAIR/SKIN/NAILS PO) Take 1 tablet by mouth daily.     [provider]  Chromium Picolinate 200 MCG CAPS Take 1 capsule by mouth daily.    [provider]  clonazePAM (KLONOPIN) 0.5 MG tablet Take 1 tablet (0.5 mg total) by mouth 2 (two) times daily as needed for anxiety. 01/28/20   Briscoe Deutscher, DO  Coenzyme Q10 (COQ10) 100 MG CAPS Take 1 capsule by mouth daily.    [provider]  cyclobenzaprine (FLEXERIL) 10 MG tablet Take 1 tablet (10 mg total) by mouth 3 (three) times daily as needed for muscle spasms. 05/18/19   Danford, Valetta Fuller D, NP  Docosahexaenoic Acid (DHA COMPLETE PO) Take 1 tablet by mouth daily.    [provider]  levonorgestrel (MIRENA) 20 MCG/24HR IUD 1 each by Intrauterine route once.    [provider]  loratadine (CLARITIN) 10 MG tablet Take 10 mg by mouth daily as needed for allergies.    [provider]  metFORMIN (GLUCOPHAGE) 500 MG  tablet Take 1 tablet (500 mg total) by mouth every morning. 06/14/20   Briscoe Deutscher, DO  Multiple Vitamins-Minerals (BARIATRIC MULTIVITAMINS/IRON PO) Take 1 tablet by mouth in the morning and at bedtime.    [provider]  nystatin cream (MYCOSTATIN) Apply 1 application topically 2 (two) times daily. Apply to affected area BID for up to 7 days. Patient taking differently: Apply 1 application topically 2 (two) times daily as needed for dry skin. 06/04/19   Salvadore Dom, MD  ondansetron (ZOFRAN) 8 MG tablet Take 1 tablet (8 mg total) by mouth every 8 (eight) hours as needed for nausea or vomiting. 07/09/18   Danford, Valetta Fuller D, NP  Probiotic Product (PROBIOTIC ADVANCED PO) Take 1 capsule by mouth daily.     [provider]  Spirulina 500 MG TABS Take 1 tablet by mouth daily.    [provider]  sulfamethoxazole-trimethoprim (BACTRIM DS) 800-160 MG tablet Take 1 tablet by mouth 2 (two) times daily. One PO BID x 3 days 06/16/20   Yisroel Ramming, Everardo All, MD  SUMAtriptan (IMITREX) 50 MG tablet Take 1 tablet (50 mg total) by mouth every 2 (two) hours as needed for migraine. May repeat in 2 hours if headache persists or recurs. 03/02/20   Lorrene Reid, PA-C  Vitamin D, Ergocalciferol, (DRISDOL) 1.25 MG (50000 UNIT) CAPS capsule TAKE 1 CAPSULE BY MOUTH EVERY 7 DAYS 06/14/20   Briscoe Deutscher, DO    Allergies    Iohexol and Pyridium [phenazopyridine hcl]  Review of Systems   Review of Systems  Constitutional: Negative for diaphoresis and fever.  Eyes: Negative for redness.  Respiratory: Negative for cough and shortness of breath.   Cardiovascular: Negative for chest pain, palpitations and leg swelling.  Gastrointestinal: Positive for nausea. Negative for abdominal pain and vomiting.  Genitourinary: Negative for dysuria.  Musculoskeletal: Negative for back pain and  neck pain.  Skin: Negative for rash.  Neurological: Positive for syncope and headaches. Negative for  light-headedness.  Psychiatric/Behavioral: The patient is not nervous/anxious.     Physical Exam Updated Vital Signs BP (!) 113/49 (BP Location: Right Arm)   Pulse (!) 57   Temp 98.7 F (37.1 C) (Oral)   Resp 14   Ht 5\' 8"  (1.727 m)   Wt 108 kg   SpO2 100%   BMI 36.19 kg/m   Physical Exam Vitals and nursing note reviewed.  Constitutional:      General: She is not in acute distress.    Appearance: She is well-developed and well-nourished. She is not diaphoretic.  HENT:     Head: Normocephalic and atraumatic.     Right Ear: External ear normal.     Left Ear: External ear normal.     Nose: Nose normal.     Mouth/Throat:     Mouth: Mucous membranes are normal. Mucous membranes are not dry.  Eyes:     Conjunctiva/sclera: Conjunctivae normal.  Neck:     Vascular: Normal carotid pulses. No carotid bruit or JVD.     Trachea: Trachea normal. No tracheal deviation.  Cardiovascular:     Rate and Rhythm: Regular rhythm. Bradycardia present.     Pulses: Intact distal pulses. No decreased pulses.     Heart sounds: Normal heart sounds, S1 normal and S2 normal. No murmur heard.     Comments: Mild bradycardia Pulmonary:     Effort: Pulmonary effort is normal. No respiratory distress.     Breath sounds: No wheezing, rhonchi or rales.  Chest:     Chest wall: No tenderness.  Abdominal:     General: Bowel sounds are normal. Aorta is normal.     Palpations: Abdomen is soft.     Tenderness: There is no abdominal tenderness. There is no guarding or rebound.  Musculoskeletal:        General: Normal range of motion.     Cervical back: Normal range of motion and neck supple. No muscular tenderness.     Right lower leg: No edema.     Left lower leg: No edema.  Skin:    General: Skin is warm and dry.     Coloration: Skin is not pale.     Findings: No rash.     Nails: There is no cyanosis.  Neurological:     General: No focal deficit present.     Mental Status: She is alert. Mental  status is at baseline.     Motor: No weakness.  Psychiatric:        Mood and Affect: Mood and affect and mood normal.     ED Results / Procedures / Treatments   Labs (all labs ordered are listed, but only abnormal results are displayed) Labs Reviewed  BASIC METABOLIC PANEL - Abnormal; Notable for the following components:      Result Value   Glucose, Bld 106 (*)    All other components within normal limits  RESP PANEL BY RT-PCR (FLU A&B, COVID) ARPGX2  CBC  MAGNESIUM  I-STAT BETA HCG BLOOD, ED ([facility], [facility], [facility] ONLY)    ED ECG REPORT   Date: 06/21/2020  Rate: 59  Rhythm: normal sinus rhythm  QRS Axis: normal  Intervals: normal  ST/T Wave abnormalities: normal  Conduction Disutrbances:right bundle branch block, incomplete  Narrative Interpretation:   Old EKG Reviewed: changes noted  I have personally reviewed the EKG tracing and agree with the computerized  printout as noted.  Radiology No results found.  Procedures Procedures   Medications Ordered in ED Medications - No data to display  ED Course  I have reviewed the triage vital signs and the nursing notes.  Pertinent labs & imaging results that were available during my care of the patient were reviewed by me and considered in my medical decision making (see chart for details).  Patient seen and examined. Reviewed previous cardiology notes.  Patient on cardiac monitor.  Will obtain basic labs and orthostatics, however I doubt these will be too revealing.  Will request additional recommendations from cardiology.  Vital signs reviewed and are as follows: BP (!) 113/49 (BP Location: Right Arm)   Pulse (!) 57   Temp 98.7 F (37.1 C) (Oral)   Resp 14   Ht 5\' 8"  (1.727 m)   Wt 108 kg   SpO2 100%   BMI 36.19 kg/m   Orthostatic VS for the past 24 hrs:  BP- Lying Pulse- Lying BP- Sitting Pulse- Sitting BP- Standing at 0 minutes Pulse- Standing at 0 minutes  06/21/20 1005 101/56 (!) 44 108/69 55 93/59 64   10:48 AM  Reviewed telemetry -- lowest resting HR 43, sinus bradycardia. Orthostatics as above. Will discuss with cardiology.   11:38 AM Spoke with Dr. Margaretann Loveless of cardiology. Plan is for them to see patient in ED. May warrant admission for monitoring, but will await further recommendations.   Patient updated on plan to this point and is agreeable to staying for consultation and admission if recommended.     MDM Rules/Calculators/A&P                          Patient with symptomatic bradycardia and syncope today.   Final Clinical Impression(s) / ED Diagnoses Final diagnoses:  Symptomatic bradycardia  Syncope, unspecified syncope type    Rx / DC Orders ED Discharge Orders    None       Carlisle Cater, PA-C 06/21/20 1139    Veryl Speak, MD 06/21/20 1521

## 2020-06-22 ENCOUNTER — Encounter (HOSPITAL_COMMUNITY): Payer: Self-pay | Admitting: Internal Medicine

## 2020-06-22 ENCOUNTER — Encounter (HOSPITAL_COMMUNITY)
Admission: EM | Disposition: A | Discharge status: Home / Self Care | Payer: Self-pay | Source: Home / Self Care | Attending: Emergency Medicine

## 2020-06-22 ENCOUNTER — Observation Stay (HOSPITAL_BASED_OUTPATIENT_CLINIC_OR_DEPARTMENT_OTHER): Payer: No Typology Code available for payment source

## 2020-06-22 DIAGNOSIS — R55 Syncope and collapse: Secondary | ICD-10-CM

## 2020-06-22 HISTORY — PX: LOOP RECORDER INSERTION: EP1214

## 2020-06-22 LAB — COMPREHENSIVE METABOLIC PANEL
ALT: 10 U/L (ref 0–44)
AST: 16 U/L (ref 15–41)
Albumin: 3.3 g/dL — ABNORMAL LOW (ref 3.5–5.0)
Alkaline Phosphatase: 54 U/L (ref 38–126)
Anion gap: 7 (ref 5–15)
BUN: 15 mg/dL (ref 6–20)
CO2: 24 mmol/L (ref 22–32)
Calcium: 8.9 mg/dL (ref 8.9–10.3)
Chloride: 106 mmol/L (ref 98–111)
Creatinine, Ser: 0.89 mg/dL (ref 0.44–1.00)
GFR, Estimated: >60 mL/min (ref >60)
Glucose, Bld: 88 mg/dL (ref 70–99)
Potassium: 4.1 mmol/L (ref 3.5–5.1)
Sodium: 137 mmol/L (ref 135–145)
Total Bilirubin: 1 mg/dL (ref 0.3–1.2)
Total Protein: 6.5 g/dL (ref 6.5–8.1)

## 2020-06-22 LAB — RESP PANEL BY RT-PCR (FLU A&B, COVID) ARPGX2
Influenza A by PCR: NEGATIVE
Influenza B by PCR: NEGATIVE
SARS Coronavirus 2 by RT PCR: NEGATIVE

## 2020-06-22 LAB — T4, FREE: Free T4: 0.83 ng/dL (ref 0.61–1.12)

## 2020-06-22 LAB — ECHOCARDIOGRAM COMPLETE
Area-P 1/2: 3.72 cm2
Calc EF: 66.6 %
Height: 68 in
S' Lateral: 2.8 cm
Single Plane A2C EF: 62.4 %
Single Plane A4C EF: 68.9 %
Weight: 3752 oz

## 2020-06-22 LAB — TSH: TSH: 2.553 u[IU]/mL (ref 0.350–4.500)

## 2020-06-22 LAB — HIV ANTIBODY (ROUTINE TESTING W REFLEX): HIV Screen 4th Generation wRfx: NONREACTIVE

## 2020-06-22 SURGERY — LOOP RECORDER INSERTION

## 2020-06-22 MED ORDER — ACETAMINOPHEN 325 MG PO TABS: 325.0000 mg | ORAL_TABLET | ORAL | Status: DC | PRN

## 2020-06-22 MED ORDER — LIDOCAINE-EPINEPHRINE 1 %-1:100000 IJ SOLN
INTRAMUSCULAR | Status: AC
  Filled 2020-06-22: qty 1

## 2020-06-22 MED ORDER — ONDANSETRON HCL 4 MG/2ML IJ SOLN: 4.0000 mg | Freq: Four times a day (QID) | INTRAMUSCULAR | Status: DC | PRN

## 2020-06-22 MED ORDER — LIDOCAINE-EPINEPHRINE 1 %-1:100000 IJ SOLN
INTRAMUSCULAR | Status: DC | PRN
  Administered 2020-06-22: 20 mL

## 2020-06-22 SURGICAL SUPPLY — 2 items
MONITOR REVEAL LINQ II (Prosthesis & Implant Heart) ×2 IMPLANT
PACK LOOP INSERTION (CUSTOM PROCEDURE TRAY) ×2 IMPLANT

## 2020-06-22 NOTE — Discharge Instructions (Signed)
NO DRIVING 6 MONTHS    Loop recorder wound care instructions Keep incision clean and dry for 3 days. You can remove outer dressing tomorrow. Leave steri-strips (little pieces of tape) on until seen in the office for wound check appointment. Call the office [phone]) for redness, drainage, swelling, or fever.

## 2020-06-22 NOTE — CV Procedure (Signed)
2D echo attempted, but patient went to cath lab. Will try later

## 2020-06-22 NOTE — Progress Notes (Signed)
Progress Note  Patient Name: [name] [name] Date of Encounter: 06/22/2020  Primary Cardiologist: Buford Dresser, MD   Subjective   Seen after loop implant, nausea but no lightheadedness or dizziness.  Inpatient Medications    Scheduled Meds: . enoxaparin (LOVENOX) injection  40 mg Subcutaneous Q24H  . sodium chloride flush  3 mL Intravenous Q12H   Continuous Infusions: . sodium chloride     PRN Meds: sodium chloride, acetaminophen, acetaminophen, ALPRAZolam, nitroGLYCERIN, ondansetron (ZOFRAN) IV, ondansetron (ZOFRAN) IV, sodium chloride flush, zolpidem   Vital Signs    Vitals:   06/22/20 0923 06/22/20 1021 06/22/20 1204 06/22/20 1814  BP:  115/60 113/72 103/65  Pulse: 68 (!) 59 90 75  Resp:  16 16 16   Temp:  98.6 F (37 C) 97.8 F (36.6 C) 97.7 F (36.5 C)  TempSrc:  Oral Oral Oral  SpO2:  96% 96% 95%  Weight:      Height:       No intake or output data in the 24 hours ending 06/22/20 1906 Filed Weights   06/21/20 0832 06/21/20 2030 06/22/20 0457  Weight: 108 kg 106.7 kg 106.4 kg    Telemetry    Sinus rhythm with bradycardia and sinus arrhythmia - Personally Reviewed  ECG    No new - Personally Reviewed  Physical Exam   GEN: No acute distress.   Neck: No JVD Cardiac: regular rhythm, normal rate, no murmurs, rubs, or gallops. Normal site of implant over left chest wall.  Respiratory: Clear to auscultation bilaterally. GI: Soft, nontender, non-distended  MS: No edema; No deformity. Neuro:  Nonfocal  Psych: Normal affect   Labs    Chemistry Recent Labs  Lab 06/21/20 1005 06/22/20 0050  NA 137 137  K 4.2 4.1  CL 106 106  CO2 24 24  GLUCOSE 106* 88  BUN 16 15  CREATININE 0.91 0.89  CALCIUM 9.0 8.9  PROT  --  6.5  ALBUMIN  --  3.3*  AST  --  16  ALT  --  10  ALKPHOS  --  54  BILITOT  --  1.0  GFRNONAA >60 >60  ANIONGAP 7 7     Hematology Recent Labs  Lab 06/21/20 1005  WBC 7.4  RBC 4.42  HGB 13.3  HCT 38.6  MCV  87.3  MCH 30.1  MCHC 34.5  RDW 12.5  PLT 264    Cardiac EnzymesNo results for input(s): TROPONINI in the last 168 hours. No results for input(s): TROPIPOC in the last 168 hours.   BNPNo results for input(s): BNP, PROBNP in the last 168 hours.   DDimer No results for input(s): DDIMER in the last 168 hours.   Radiology    EP PPM/ICD IMPLANT  Result Date: 06/22/2020 CONCLUSIONS:  1. Successful implantation of a Medtronic Reveal LINQ implantable loop recorder for unexplained syncope  2. No early apparent complications. Cristopher Peru, MD 06/22/2020 10:14 AM    Cardiac Studies   Echo pending  Patient Profile     40 y.o. female with syncope likely secondary to high vagal tone.  Assessment & Plan   Principal Problem:   Syncope and collapse Active Problems:   Syncope   Syncope and bradycardia - loop implanted today.  -echo pending for rule out structural causes of bradycardia. I want to exclude evidence of advanced granulomatous infiltration such as cardiac sarcoid.  - we consider cardiac MRI, if malignant arhythmia on loop, would consider. Loop is MR compatible but may cause imaging artifacts,  if planning for explant at some point would do MRI after that.  - zofran for nausea.    For questions or updates, please contact [place] Please consult [email or web address] for contact info under        Signed, Elouise Munroe, MD  06/22/2020, 7:06 PM

## 2020-06-22 NOTE — Discharge Summary (Incomplete)
Discharge Summary    [name] MRN: [id number]; DOB: 17-Jul-1980  Admit date: 06/21/2020 Discharge date: 06/22/2020  PCP:  Lorrene Reid, [place]  Cardiologist:  Buford Dresser, MD  Advanced Practice Provider:  No care team member to display Electrophysiologist:  None  {Press F2 to show EP APP, CHF, sleep or structural heart MD               :[id number]}  { Click here to update Patient Care Team and Refresh Note - MD (PCP) or APP (Team Member)  Change PCP Type for MD, Specialty for APP is either Cardiology or Clinical Cardiac Electrophysiology  :[id number]}    Discharge Diagnoses    Principal Problem:   Syncope and collapse Active Problems:   Syncope    Diagnostic Studies/Procedures    *** _____________   History of Present Illness     [name] [name] is a 40 y.o. female with history of presyncope and bradycardia, DM, HTN, HLD, GERD, IBS, OSA, anxiety. S/p monitor w/ sinus bradycardia but no cause for syncope, loop recorder planned.   [name] [name] was in her usual state of health today.  She was at work.  She was sitting still, had been sitting for a while.  She had her normal breakfast.  She was not sick in any way.  She had had a reasonable amount of water to drink this morning.  She was not wearing compression stockings today, but she actually does wear them.  All of a sudden, she felt very lightheaded and dizzy and felt that she was going to pass out.  She told coworkers and they advised her to get for.  She remembers feeling the floor and then waking up with her having a coworker's letter.  Shortly after waking, she was completely oriented to self and place.  She has a headache which she always gets after the presyncopal episodes.  This is the second time she is passed out, the first time was months ago.  She has these episodes where she will get lightheaded and dizzy.  They can happen several times a day.  Not  orthostatic in nature.  The episodes seem to come in clusters.  She will go months without having a, then have a 1 or 17-month.  Where she has multiple episodes, averaging more than 1 a day.  These episodes have been ongoing for several years, although she has only passed out once before.  At that time, she was at work and coworkers were checking her pulse rate after she woke up.  This was by a pulse ox machine.  They told her heart rate was in the 20s.  By EMS arrival, her heart rate had improved.    She is followed by Dr. Harrell Gave for the episodes.  Since no cause was found on the monitor that she wore recently, a loop recorder has been scheduled for 2/28.  Currently she is resting comfortably except for a headache.   Hospital Course     Consultants: ***   ***  Did the patient have an acute coronary syndrome (MI, NSTEMI, STEMI, etc) this admission?:  {Press F2   :[id number]}   {Are you ordering a CV Procedure (e.g. stress test, cath, DCCV, TEE, etc) to be done as an outpatient?   Press F2        :[id number]}  _____________  Discharge Vitals Blood pressure 113/72, pulse 90, temperature 97.8 F (36.6 C), temperature source  Oral, resp. rate 16, height 5\' 8"  (1.727 m), weight 106.4 kg, SpO2 96 %.  Filed Weights   06/21/20 0832 06/21/20 2030 06/22/20 0457  Weight: 108 kg 106.7 kg 106.4 kg    Labs & Radiologic Studies    CBC Recent Labs    06/21/20 1005  WBC 7.4  HGB 13.3  HCT 38.6  MCV 87.3  PLT 166   Basic Metabolic Panel Recent Labs    06/21/20 1005 06/22/20 0050  NA 137 137  K 4.2 4.1  CL 106 106  CO2 24 24  GLUCOSE 106* 88  BUN 16 15  CREATININE 0.91 0.89  CALCIUM 9.0 8.9  MG 2.0  --    Liver Function Tests Recent Labs    06/22/20 0050  AST 16  ALT 10  ALKPHOS 54  BILITOT 1.0  PROT 6.5  ALBUMIN 3.3*   No results for input(s): LIPASE, AMYLASE in the last 72 hours. High Sensitivity Troponin:   Recent Labs  Lab 06/08/20 0645 06/08/20 1004   TROPONINIHS 2 <2    BNP Invalid input(s): POCBNP D-Dimer No results for input(s): DDIMER in the last 72 hours. Hemoglobin A1C No results for input(s): HGBA1C in the last 72 hours. Fasting Lipid Panel No results for input(s): CHOL, HDL, LDLCALC, TRIG, CHOLHDL, LDLDIRECT in the last 72 hours. Thyroid Function Tests Recent Labs    06/22/20 0050  TSH 2.553   _____________  DG Chest 2 View  Result Date: 06/08/2020 CLINICAL DATA:  Chest pain. EXAM: CHEST - 2 VIEW COMPARISON:  09/30/2019. FINDINGS: Mediastinum and hilar structures normal. Heart size normal. Low lung volumes. No focal infiltrate. No pleural effusion or pneumothorax. Degenerative changes and scoliosis thoracic spine. Surgical clips right upper quadrant. IMPRESSION: Low lung volumes.  No acute cardiopulmonary disease. Electronically Signed   By: Marcello Moores  Register   On: 06/08/2020 06:56   EP PPM/ICD IMPLANT  Result Date: 06/22/2020 CONCLUSIONS:  1. Successful implantation of a Medtronic Reveal LINQ implantable loop recorder for unexplained syncope  2. No early apparent complications. Cristopher Peru, MD 06/22/2020 10:14 AM   Disposition   Pt is being discharged home today in good condition.  Follow-up Plans & Appointments     Follow-up Information    [facility] Follow up.   Specialty: Cardiology Why: 07/05/20 @ 10:40am, WOUND CHECK VISIT Contact information: [address], [place] [address] [phone]               Discharge Medications   Allergies as of 06/22/2020      Reactions   Iohexol Hives    Code: HIVES, Desc: *NEED 13 HR PREDISONE AND BENADRYL  BE FOR IV CONTRAST GIVEN* /DR OLSON PT DEVELOPED HIVES AND ROOF OF MOUTH BEGIN "ITCHING"", Onset Date: [id number]   Pyridium [phenazopyridine Hcl] Other (See Comments)   vaginitis symptoms when on it    Med Rec must be completed prior to using this [place]***          Outstanding Labs/Studies    ***  Duration of Discharge Encounter   Greater than 30 minutes including physician time.  Hilbert Corrigan, PA 06/22/2020, 12:12 PM

## 2020-06-22 NOTE — Progress Notes (Addendum)
Progress Note  Patient Name: [name] [name] Date of Encounter: 06/22/2020  [facility] Cardiologist: Buford Dresser, MD   Subjective   Had some dizziness upon sitting from supine last night.  Inpatient Medications    Scheduled Meds: . enoxaparin (LOVENOX) injection  40 mg Subcutaneous Q24H  . sodium chloride flush  3 mL Intravenous Q12H   Continuous Infusions: . sodium chloride     PRN Meds: sodium chloride, acetaminophen, ALPRAZolam, nitroGLYCERIN, ondansetron (ZOFRAN) IV, sodium chloride flush, zolpidem   Vital Signs    Vitals:   06/21/20 2022 06/21/20 2030 06/22/20 0015 06/22/20 0457  BP: 118/82  102/68 109/65  Pulse: 67  60 63  Resp: 16  18 16   Temp: 98.1 F (36.7 C)  97.6 F (36.4 C) 97.8 F (36.6 C)  TempSrc: Oral  Oral Oral  SpO2: 100%  98% 98%  Weight:  106.7 kg  106.4 kg  Height:  5\' 8"  (1.727 m)     No intake or output data in the 24 hours ending 06/22/20 0834 Last 3 Weights 06/22/2020 06/21/2020 06/21/2020  Weight (lbs) 234 lb 8 oz 235 lb 3.2 oz 238 lb  Weight (kg) 106.369 kg 106.686 kg 107.956 kg      Telemetry    SB 45-80's - Personally Reviewed  ECG    No new EKGs - Personally Reviewed  Physical Exam   GEN: No acute distress.   Neck: No JVD Cardiac: RRR, no murmurs, rubs, or gallops.  Respiratory: CTA b/l. GI: Soft, nontender, non-distended  MS: No edema; No deformity. Neuro:  Nonfocal  Psych: Normal affect   Labs    High Sensitivity Troponin:   Recent Labs  Lab 06/08/20 0645 06/08/20 1004  TROPONINIHS 2 <2      Chemistry Recent Labs  Lab 06/21/20 1005 06/22/20 0050  NA 137 137  K 4.2 4.1  CL 106 106  CO2 24 24  GLUCOSE 106* 88  BUN 16 15  CREATININE 0.91 0.89  CALCIUM 9.0 8.9  PROT  --  6.5  ALBUMIN  --  3.3*  AST  --  16  ALT  --  10  ALKPHOS  --  54  BILITOT  --  1.0  GFRNONAA >60 >60  ANIONGAP 7 7     Hematology Recent Labs  Lab 06/21/20 1005  WBC 7.4  RBC 4.42  HGB 13.3  HCT 38.6   MCV 87.3  MCH 30.1  MCHC 34.5  RDW 12.5  PLT 264    BNPNo results for input(s): BNP, PROBNP in the last 168 hours.   DDimer No results for input(s): DDIMER in the last 168 hours.   Radiology    No results found.  Cardiac Studies   Updated echo is ordered and pending  July 2021 12 days of data recorded on Zio monitor. Patient had a min HR of 37 bpm, max HR of 180 bpm, and avg HR of 67 bpm. Predominant underlying rhythm was Sinus Rhythm. No VT, SVT, atrial fibrillation, high degree block, or pauses noted. Isolated atrial and ventricular ectopy was rare (<1%). There were 91 triggered events. These were all sinus, with occasional isolate PAC or PVC. No significant arrhythmias detected.   03/23/2018 IMPRESSION: 1. Coronary calcium score of 46. This was 57 percentile for age and sex matched control.  2. Normal coronary origin with right dominance.  3. 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 Aggressive medical management is recommended.  4. Mildly dilated pulmonary artery measuring 32  mm suggestive of pulmonary hypertension.   03/23/2018 Study Conclusions  - Left ventricle: The cavity size was normal. Systolic function was  normal. The estimated ejection fraction was in the range of 55%  to 60%. Wall motion was normal; there were no regional wall  motion abnormalities. Left ventricular diastolic function  parameters were normal.  - Left atrium: The atrium was mildly dilated.  - Atrial septum: No defect or patent foramen ovale was identified.    Patient Profile     40 y.o. female  PCOS, obesity s/p bariatric surgery, fomer smoker, recurrent near syncope, admitted with syncope  Assessment & Plan    1. Recurrent near syncope 2. 1st syncopal event  Dr. Rayann Heman saw yesterday suspect sinus node dysfunction, likely related to vagal tone and prior gastric surgery.  though given her very young age, we should avoid pacing if possible.  She is  in agreement with this and planned for loop.  She had some mild orthostatic dizziness last night No associated brady, tachy/arrhythmias in the time frame she recalls Historically she has had negative orthostatic vitals Will get some this AM  Dr. Lovena Le has seen and examined the patient this AM, planned for loop implant Did not think she needed to be held for echo could be updated outpatient if not done prior to her being ready for d/c  Pt is aware no driving 55mo   For questions or updates, please contact [place] Please consult [email or web address] for contact info under     Signed, Baldwin Jamaica, PA-C  06/22/2020, 8:34 AM    EP Attending  Patient seen and examined. Agree with above. The patient is stable over night. Discussed need for ILR with Dr. Greggory Brandy and the patient and we will proceed with ILR insertion due to unexplained syncope.  Carleene Overlie Taylor,MD

## 2020-06-22 NOTE — Progress Notes (Signed)
  Echocardiogram 2D Echocardiogram has been performed.  [name] [name] 06/22/2020, 3:32 PM

## 2020-06-22 NOTE — Progress Notes (Signed)
Pt returned from cath lab by cath lab staff. Pt GCS 15 appears calm. Left anterior chest dressing with scant bloody spotting. Will continue to monitor.

## 2020-06-22 NOTE — Progress Notes (Signed)
Spoke with the patient, underwent loop recorder implantation today, mild oozing at the implant site. She has no one to stay with her tonight, sister is flying into the town tomorrow at [employer]. Will keep overnight for observation. Discussed with Dr. Margaretann Loveless

## 2020-06-23 ENCOUNTER — Other Ambulatory Visit: Payer: Self-pay | Admitting: Physician Assistant

## 2020-06-23 DIAGNOSIS — R55 Syncope and collapse: Secondary | ICD-10-CM | POA: Diagnosis not present

## 2020-06-23 NOTE — Progress Notes (Signed)
   Primary Cardiologist: Buford Dresser, MD  Mrs. [name] [name] (DOB Nov 12, 1980) was admitted to the [facility] on 06/21/2020 and discharged on 06/23/2020. We recommend she hold off on going back to work until 07/04/2020.  Thank you for your understanding  Please give [place] a call if you have any questions  Almyra Deforest, [place] [address] [place], [place] [address] [phone] 06/23/2020, 9:33 AM

## 2020-06-23 NOTE — Progress Notes (Signed)
Discharge teaching complete. Meds, diet, activity, follow up appointments, wound care and driving restrictions reviewed and all questions answered. Copy of instructions given to patient. Patient discharged home via wheelchair.

## 2020-06-23 NOTE — Discharge Summary (Addendum)
Discharge Summary    [name] MRN: [id number]; DOB: [birth year]  Admit date: 06/21/2020 Discharge date: 06/23/2020  PCP:  Lorrene Reid, [place]  Cardiologist:  Buford Dresser, MD  Advanced Practice Provider:  No care team member to display Electrophysiologist:  None  :[id number]}  Discharge Diagnoses    Principal Problem:   Syncope and collapse Active Problems:   Syncope    Diagnostic Studies/Procedures    EP Loop recorder implantation 06/22/2020 CONCLUSIONS:   1. Successful implantation of a Medtronic Reveal LINQ implantable loop recorder for unexplained syncope  2. No early apparent complications.    Echo 06/22/2020  1. Left ventricular ejection fraction, by estimation, is 60 to 65%. The  left ventricle has normal function. The left ventricle has no regional  wall motion abnormalities. There is mild left ventricular hypertrophy.  Left ventricular diastolic parameters  were normal.   2. Right ventricular systolic function is normal. The right ventricular  size is normal. Tricuspid regurgitation signal is inadequate for assessing  PA pressure.   3. The mitral valve is normal in structure. No evidence of mitral valve  regurgitation.   4. The aortic valve was not well visualized. Aortic valve regurgitation  is not visualized. No aortic stenosis is present.   5. The inferior vena cava is normal in size with greater than 50%  respiratory variability, suggesting right atrial pressure of 3 mmHg.  _____________   History of Present Illness     [name] [name] is a 40 y.o. female with history of presyncope and bradycardia, DM, HTN, HLD, GERD, IBS, OSA, anxiety. S/p monitor w/ sinus bradycardia but no cause for syncope, loop recorder planned.    [name] [name] was in her usual state of health today.  She was at work.  She was sitting still, had been sitting for a while.  She had her normal breakfast.  She was not sick  in any way.  She had had a reasonable amount of water to drink this morning.  She was not wearing compression stockings today, but she actually does wear them.   All of a sudden, she felt very lightheaded and dizzy and felt that she was going to pass out.  She told coworkers and they advised her to get for.  She remembers feeling the floor and then waking up with her having a coworker's letter.   Shortly after waking, she was completely oriented to self and place.  She has a headache which she always gets after the presyncopal episodes.  This is the second time she is passed out, the first time was months ago.   She has these episodes where she will get lightheaded and dizzy.  They can happen several times a day.  Not orthostatic in nature.  The episodes seem to come in clusters.  She will go months without having a, then have a 1 or 66-month.  Where she has multiple episodes, averaging more than 1 a day.   These episodes have been ongoing for several years, although she has only passed out once before.  At that time, she was at work and coworkers were checking her pulse rate after she woke up.  This was by a pulse ox machine.  They told her heart rate was in the 20s.  By EMS arrival, her heart rate had improved.     She is followed by Dr. Harrell Gave for the episodes.  Since no cause was found on the  monitor that she wore recently, a loop recorder has been scheduled for 2/28.   Currently she is resting comfortably except for a headache.  Hospital Course     Consultants: N/A   Due to dizziness and presyncope, she was admitted to cardiology service.  Lab work shows normal renal function and electrolyte.  TSH and free T4 were normal.  Viral panel normal.  Patient was seen by electrophysiology service Dr. Rayann Heman on 06/21/2020 there was some concern of symptomatic bradycardia and sinus node dysfunction based on her history and presentation.  Given her very young age, EP service recommended try to avoid  pacing if possible.  Therefore loop recorder was implanted on 06/22/2020 to further diagnose her symptoms.  Echocardiogram obtained on 06/22/2020 shows EF 60 to 65%, mild LVH, no significant valve disorder.  She was seen in the morning of 06/22/2020 at which time she has been doing well, she is deemed stable for discharge from cardiology perspective. I have given her a work note for 1 week off to observe for further symptom of presyncope.     Did the patient have an acute coronary syndrome (MI, NSTEMI, STEMI, etc) this admission?:  No                               Did the patient have a percutaneous coronary intervention (stent / angioplasty)?:  No.       _____________  Discharge Vitals Blood pressure 113/64, pulse 64, temperature 98 F (36.7 C), temperature source Oral, resp. rate 16, height 5\' 8"  (1.727 m), weight 106.3 kg, SpO2 98 %.  Filed Weights   06/21/20 2030 06/22/20 0457 06/23/20 0507  Weight: 106.7 kg 106.4 kg 106.3 kg    Physical Exam    GEN: No acute distress.   Neck: No JVD Cardiac: regular rhythm, normal rate, no murmurs, rubs, or gallops. Normal site of implant over left chest wall.  Respiratory: Clear to auscultation bilaterally. GI: Soft, nontender, non-distended  MS: No edema; No deformity. Neuro:  Nonfocal  Psych: Normal affect   Labs & Radiologic Studies    CBC Recent Labs    06/21/20 1005  WBC 7.4  HGB 13.3  HCT 38.6  MCV 87.3  PLT 295   Basic Metabolic Panel Recent Labs    06/21/20 1005 06/22/20 0050  NA 137 137  K 4.2 4.1  CL 106 106  CO2 24 24  GLUCOSE 106* 88  BUN 16 15  CREATININE 0.91 0.89  CALCIUM 9.0 8.9  MG 2.0  --    Liver Function Tests Recent Labs    06/22/20 0050  AST 16  ALT 10  ALKPHOS 54  BILITOT 1.0  PROT 6.5  ALBUMIN 3.3*   No results for input(s): LIPASE, AMYLASE in the last 72 hours. High Sensitivity Troponin:   Recent Labs  Lab 06/08/20 0645 06/08/20 1004  TROPONINIHS 2 <2    BNP Invalid input(s):  POCBNP D-Dimer No results for input(s): DDIMER in the last 72 hours. Hemoglobin A1C No results for input(s): HGBA1C in the last 72 hours. Fasting Lipid Panel No results for input(s): CHOL, HDL, LDLCALC, TRIG, CHOLHDL, LDLDIRECT in the last 72 hours. Thyroid Function Tests Recent Labs    06/22/20 0050  TSH 2.553   _____________  DG Chest 2 View  Result Date: 06/08/2020 CLINICAL DATA:  Chest pain. EXAM: CHEST - 2 VIEW COMPARISON:  09/30/2019. FINDINGS: Mediastinum and hilar structures normal. Heart size normal.  Low lung volumes. No focal infiltrate. No pleural effusion or pneumothorax. Degenerative changes and scoliosis thoracic spine. Surgical clips right upper quadrant. IMPRESSION: Low lung volumes.  No acute cardiopulmonary disease. Electronically Signed   By: Marcello Moores  Register   On: 06/08/2020 06:56   EP PPM/ICD IMPLANT  Result Date: 06/22/2020 CONCLUSIONS:  1. Successful implantation of a Medtronic Reveal LINQ implantable loop recorder for unexplained syncope  2. No early apparent complications. Cristopher Peru, MD 06/22/2020 10:14 AM   ECHOCARDIOGRAM COMPLETE  Result Date: 06/22/2020    ECHOCARDIOGRAM REPORT   Patient Name:   [name] [name] [facility] Date of Exam: 06/22/2020 Medical Rec #:  [id number]       Height:       68.0 in Accession #:    [id number]      Weight:       234.5 lb Date of Birth:  Jan 17, 1981       BSA:          2.187 m Patient Age:    64 years        BP:           113/72 mmHg Patient Gender: F               HR:           59 bpm. Exam Location:  Inpatient Procedure: 2D Echo, 3D Echo, Cardiac Doppler and Color Doppler Indications:    R55 Syncope  History:        Patient has prior history of Echocardiogram examinations, most                 recent 03/23/2018. Abnormal ECG, Signs/Symptoms:Chest Pain and                 Syncope; Risk Factors:Dyslipidemia and Diabetes.  Sonographer:    Roseanna Rainbow RDCS Referring Phys: [id number] [place]  Sonographer Comments: Technically difficult study  due to poor echo windows. Image acquisition challenging due to patient body habitus. Patient has fresh loop recorder implant in parasternal region. Off axis views. IMPRESSIONS  1. Left ventricular ejection fraction, by estimation, is 60 to 65%. The left ventricle has normal function. The left ventricle has no regional wall motion abnormalities. There is mild left ventricular hypertrophy. Left ventricular diastolic parameters were normal.  2. Right ventricular systolic function is normal. The right ventricular size is normal. Tricuspid regurgitation signal is inadequate for assessing PA pressure.  3. The mitral valve is normal in structure. No evidence of mitral valve regurgitation.  4. The aortic valve was not well visualized. Aortic valve regurgitation is not visualized. No aortic stenosis is present.  5. The inferior vena cava is normal in size with greater than 50% respiratory variability, suggesting right atrial pressure of 3 mmHg. FINDINGS  Left Ventricle: Left ventricular ejection fraction, by estimation, is 60 to 65%. The left ventricle has normal function. The left ventricle has no regional wall motion abnormalities. The left ventricular internal cavity size was normal in size. There is  mild left ventricular hypertrophy. Left ventricular diastolic parameters were normal. Right Ventricle: The right ventricular size is normal. Right vetricular wall thickness was not well visualized. Right ventricular systolic function is normal. Tricuspid regurgitation signal is inadequate for assessing PA pressure. Left Atrium: Left atrial size was normal in size. Right Atrium: Right atrial size was normal in size. Pericardium: There is no evidence of pericardial effusion. Mitral Valve: The mitral valve is normal in structure. No evidence of mitral valve regurgitation. Tricuspid  Valve: The tricuspid valve is normal in structure. Tricuspid valve regurgitation is trivial. Aortic Valve: The aortic valve was not well visualized.  Aortic valve regurgitation is not visualized. No aortic stenosis is present. Pulmonic Valve: The pulmonic valve was not well visualized. Pulmonic valve regurgitation is not visualized. Aorta: The aortic root is normal in size and structure. Venous: The inferior vena cava is normal in size with greater than 50% respiratory variability, suggesting right atrial pressure of 3 mmHg. IAS/Shunts: The interatrial septum was not well visualized.  LEFT VENTRICLE PLAX 2D LVIDd:         4.10 cm     Diastology LVIDs:         2.80 cm     LV e' medial:    8.59 cm/s LV PW:         1.50 cm     LV E/e' medial:  5.9 LV IVS:        1.20 cm     LV e' lateral:   8.81 cm/s LVOT diam:     1.80 cm     LV E/e' lateral: 5.7 LV SV:         43 LV SV Index:   20 LVOT Area:     2.54 cm  LV Volumes (MOD) LV vol d, MOD A2C: 63.3 ml LV vol d, MOD A4C: 90.7 ml LV vol s, MOD A2C: 23.8 ml LV vol s, MOD A4C: 28.2 ml LV SV MOD A2C:     39.5 ml LV SV MOD A4C:     90.7 ml LV SV MOD BP:      51.7 ml RIGHT VENTRICLE             IVC RV S prime:     13.20 cm/s  IVC diam: 2.00 cm LEFT ATRIUM             Index       RIGHT ATRIUM           Index LA diam:        3.50 cm 1.60 cm/m  RA Area:     11.70 cm LA Vol (A2C):   29.0 ml 13.26 ml/m RA Volume:   24.90 ml  11.39 ml/m LA Vol (A4C):   28.8 ml 13.17 ml/m LA Biplane Vol: 29.7 ml 13.58 ml/m  AORTIC VALVE LVOT Vmax:   84.80 cm/s LVOT Vmean:  60.300 cm/s LVOT VTI:    0.169 m  AORTA Ao Root diam: 2.80 cm MITRAL VALVE MV Area (PHT): 3.72 cm    SHUNTS MV Decel Time: 204 msec    Systemic VTI:  0.17 m MV E velocity: 50.30 cm/s  Systemic Diam: 1.80 cm MV A velocity: 65.20 cm/s MV E/A ratio:  0.77 Oswaldo Milian MD Electronically signed by Oswaldo Milian MD Signature Date/Time: 06/22/2020/11:47:56 PM    Final    Disposition   Pt is being discharged home today in good condition.  Follow-up Plans & Appointments     Follow-up Information     [facility] Follow up.   Specialty:  Cardiology Why: 07/05/20 @ 10:40am, WOUND CHECK VISIT Contact information: [address] [place]        Deberah Pelton, NP Follow up on 07/07/2020.   Specialty: Cardiology Why: 11:45AM. Cardiology follow up.  Contact information: [address] [place] [address] [phone]  Discharge Instructions     Diet - low sodium heart healthy   Complete by: As directed    Discharge instructions   Complete by: As directed    Your electrophysiologist recommend no driving for 6 month   Discharge wound care:   Complete by: As directed    Keep area clean and dry. Follow up with wound care clinic appt as scheduled   Increase activity slowly   Complete by: As directed        Discharge Medications   Allergies as of 06/23/2020       Reactions   Iohexol Hives    Code: HIVES, Desc: *NEED 13 HR PREDISONE AND BENADRYL  BE FOR IV CONTRAST GIVEN* /DR OLSON PT DEVELOPED HIVES AND ROOF OF MOUTH BEGIN "ITCHING"", Onset Date: [id number]   Pyridium [phenazopyridine Hcl] Other (See Comments)   vaginitis symptoms when on it        Medication List     STOP taking these medications    sulfamethoxazole-trimethoprim 800-160 MG tablet Commonly known as: Bactrim DS       TAKE these medications    APPLE CIDER VINEGAR PO Take 1 tablet by mouth daily.   ascorbic acid 500 MG tablet Commonly known as: VITAMIN C Take 500 mg by mouth daily.   BARIATRIC MULTIVITAMINS/IRON PO Take 1 tablet by mouth in the morning and at bedtime.   Chromium Picolinate 200 MCG Caps Take 1 capsule by mouth daily.   clonazePAM 0.5 MG tablet Commonly known as: KLONOPIN Take 1 tablet (0.5 mg total) by mouth 2 (two) times daily as needed for anxiety.   CoQ10 100 MG Caps Take 1 capsule by mouth daily.   cyclobenzaprine 10 MG tablet Commonly known as: FLEXERIL Take 1 tablet (10 mg total) by mouth 3 (three) times daily  as needed for muscle spasms.   DHA COMPLETE PO Take 1 tablet by mouth daily.   HAIR/SKIN/NAILS PO Take 1 tablet by mouth daily.   levonorgestrel 20 MCG/24HR IUD Commonly known as: MIRENA 1 each by Intrauterine route once.   loratadine 10 MG tablet Commonly known as: CLARITIN Take 10 mg by mouth daily as needed for allergies.   metFORMIN 500 MG tablet Commonly known as: GLUCOPHAGE Take 1 tablet (500 mg total) by mouth every morning.   nystatin cream Commonly known as: MYCOSTATIN Apply 1 application topically 2 (two) times daily. Apply to affected area BID for up to 7 days. What changed:  when to take this reasons to take this additional instructions   ondansetron 8 MG tablet Commonly known as: ZOFRAN Take 1 tablet (8 mg total) by mouth every 8 (eight) hours as needed for nausea or vomiting.   PROBIOTIC ADVANCED PO Take 1 capsule by mouth daily.   Spirulina 500 MG Tabs Take 1 tablet by mouth daily.   SUMAtriptan 50 MG tablet Commonly known as: Imitrex Take 1 tablet (50 mg total) by mouth every 2 (two) hours as needed for migraine. May repeat in 2 hours if headache persists or recurs.   Vitamin D (Ergocalciferol) 1.25 MG (50000 UNIT) Caps capsule Commonly known as: DRISDOL TAKE 1 CAPSULE BY MOUTH EVERY 7 DAYS What changed:  how much to take how to take this when to take this additional instructions               Discharge Care Instructions  (From admission, onward)           Start     Ordered   06/23/20 0000  Discharge wound care:       Comments: Keep area clean and dry. Follow up with wound care clinic appt as scheduled   06/23/20 0928               Outstanding Labs/Studies   N/A  Duration of Discharge Encounter   Greater than 30 minutes including physician time.  Hilbert Corrigan, PA 06/23/2020, 9:28 AM  Patient seen and examined with Almyra Deforest PA.  Agree as above, with the following exceptions and changes as noted below. Feels  well. Loop site with bloody bandage but no evidence of purulence or drainage. Tender to touch. Supportive bra recommended. Gen: NAD, CV: RRR, no murmurs, Lungs: clear, Abd: soft, Extrem: Warm, well perfused, no edema, Neuro/Psych: alert and oriented x 3, normal mood and affect. All available labs, radiology testing, previous records reviewed. Plan follow up with device clinic and APP upcoming 07/05/20 and 07/07/20. All questions answered.  Elouise Munroe, MD 06/23/20 9:47 AM

## 2020-06-28 ENCOUNTER — Telehealth: Payer: Self-pay | Admitting: Cardiology

## 2020-06-28 ENCOUNTER — Other Ambulatory Visit: Payer: Self-pay | Admitting: *Deleted

## 2020-06-28 ENCOUNTER — Encounter: Payer: Self-pay | Admitting: *Deleted

## 2020-06-28 NOTE — Telephone Encounter (Signed)
New Message:     Pt says he need to talk to Dr Harrell Gave or her nurse please. She have been out of from [facility] and she needs to discuss some paper work that she will have to fill out .

## 2020-06-28 NOTE — Patient Outreach (Signed)
[facility]) Care Management  06/28/2020  [place] 1981-01-26 [id number]   Transition of care call/case closure   Referral received:06/28/20 Initial outreach:06/28/20 Insurance: [place] [employer]   Subjective: Initial successful telephone call to patient's preferred number in order to complete transition of care assessment; 2 HIPAA identifiers verified. Explained purpose of call and completed transition of care assessment.  [name] [name] states that she is doing pretty good, she discussed still having some episodes of dizziness, no syncope. She discussed having new Loop recorder implanted and hopeful that will provide additional information related to episodes. She reports that she continues to note having episodes of bradycardia, occasionally down to 28- 40, that is when she may experience dizziness no syncope she discussed being a ongoing issue for a while Reinforced with patient to seek medical attention of concerns regarding increased episodes . She continues to use app on phone to make notes regarding heart rate readings and how she is feeling. She reports loop recorder site with steri strips in place no signs of infections, has soreness at site. She reports tolerating diet heart healthy diet focusing on staying hydrated drinking zero gatorade.  Spouse/children are assisting with her  recovery. She understands instruction on not driving for 6 months and reports having family, friend support for managing when able to return to work.   Reviewed accessing the following [place] Benefits : She discussed being enrolled in [place] chronic condition management program for education and support on chronic condition of diabetes, hyperlipidemia.  She states that she does  have the hospital indemnity, appreciative of reminder to file , provided contact number to [place]. Also provided contact number for [place] for shorterm disability if needed.  She uses a [facility]  outpatient pharmacy at [facility] outpatient pharmacy   Objective:  [name] [name]  was hospitalized at [facility]/15-2/17/22 for Syncope, Bradycardia implantable loop recorder placed on 06/22/20. Comorbidities include: Diabetes type 2, hypertension, hyperlipidemia,  She  was discharged to home on 06/23/20  without the need for home health services or DME.   Assessment:  Patient voices good understanding of all discharge instructions.  See transition of care flowsheet for assessment details.   Plan:  Reviewed hospital discharge diagnosis of Syncope, Bradycardia  and discharge treatment plan using hospital discharge instructions, assessing medication adherence, reviewing problems requiring provider notification, and discussing the importance of follow up with surgeon, primary care provider and/or specialists as directed.  Reviewed [place] healthy lifestyle program information to receive discounted premium for  2023   Step 1: Get  your annual physical  Step 2: Complete your health assessment  Step 3:Identify your current health status and complete the corresponding action step between May 07, 2020 and January 05, 2021.    Using [place] website, verified that patient is an active participate in [place]'s Active Health Management chronic disease management program.    No ongoing care management needs identified so will close case to [place] Management services and route successful outreach letter with [place] Management pamphlet and 24 Hour Nurse Line Magnet to [place] Management clinical pool to be mailed to patient's home address.  Thanked patient for their services to [facility].   Joylene Draft, RN, BSN  [place] Management Coordinator  [phone]- Mobile [phone]- Toll Free Main Office

## 2020-06-28 NOTE — Telephone Encounter (Signed)
Returned call to patient who states that she wanted to call to make Dr. Harrell Gave and her nurse know that she was having forms faxed over to the office to be filled out. 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 Patient states she sent in a MyChart message as well. Advised patient I would forward message to Dr. Harrell Gave and her nurse to make them aware. Patient verbalized understanding.

## 2020-06-28 NOTE — Telephone Encounter (Signed)
[facility] received paperwork from [employer] on 2/22. Forms were given to [place].

## 2020-06-30 ENCOUNTER — Ambulatory Visit (INDEPENDENT_AMBULATORY_CARE_PROVIDER_SITE_OTHER): Payer: No Typology Code available for payment source | Admitting: Family Medicine

## 2020-07-04 ENCOUNTER — Institutional Professional Consult (permissible substitution): Payer: No Typology Code available for payment source | Admitting: Internal Medicine

## 2020-07-05 ENCOUNTER — Ambulatory Visit (INDEPENDENT_AMBULATORY_CARE_PROVIDER_SITE_OTHER): Payer: No Typology Code available for payment source | Admitting: Emergency Medicine

## 2020-07-05 ENCOUNTER — Other Ambulatory Visit: Payer: Self-pay

## 2020-07-05 DIAGNOSIS — R55 Syncope and collapse: Secondary | ICD-10-CM

## 2020-07-05 LAB — CUP PACEART INCLINIC DEVICE CHECK
Date Time Interrogation Session: 20220301110820
Implantable Pulse Generator Implant Date: 20220216

## 2020-07-05 NOTE — Progress Notes (Signed)
ILR wound check in clinic. Steri strips removed. Wound well healed. Home monitor transmitting nightly. 7 symptom episodes, no brady, or pause episodes noted.  1 episode of ST on 07/01/20 with peak HR in 140's. Patient reported syncopal episode 07/03/20 around 2050, recording showed SR in 70's with no ectopy.Questions answered.

## 2020-07-05 NOTE — Patient Instructions (Signed)
Call the device clinic with any questions or concerns Monday - Friday from 8 am to 5 pm at [phone].

## 2020-07-07 NOTE — Progress Notes (Signed)
Cardiology Clinic Note   Patient Name: [name] [name] [facility] Date of Encounter: 07/08/2020  Primary Care Provider:  Lorrene Reid, PA-C Primary Cardiologist:  [name] Dresser, MD  Patient Profile    [name] [name] 40 year old female presents the clinic today for follow-up evaluation of her coronary atherosclerosis, hyperlipidemia, and near syncope.  Past Medical History    Past Medical History:  Diagnosis Date  . Abnormal uterine bleeding   . Amenorrhea   . Anemia   . Anxiety    panic attacks  . Back pain   . Bradycardia   . Chest pain   . Constipation   . COVID 02/2020  . Diabetes ([place])   . Dysmenorrhea   . Endometriosis   . Gallbladder problem   . GERD (gastroesophageal reflux disease)    occasional - diet controlled and tums prn  . HLD (hyperlipidemia)   . HTN (hypertension)   . IBS (irritable bowel syndrome)   . Infertility, female   . Insulin resistance   . Migraine without aura   . Missed abortion    x 2 - both resolved without surgery  . Obesity   . PCOS (polycystic ovarian syndrome)   . PCOS (polycystic ovarian syndrome)   . PONV (postoperative nausea and vomiting)    pe rpatient ; scopalamine patch very helpful   . Sleep apnea   . SVD (spontaneous vaginal delivery)    x 1  . Syncope and collapse 06/21/2020  . Urinary incontinence   . Vitamin D deficiency    Past Surgical History:  Procedure Laterality Date  . CHOLECYSTECTOMY    . COLONOSCOPY  2017   polyps  . CYSTOSCOPY N/A 10/29/2016   Procedure: CYSTOSCOPY;  Surgeon: [name] Dom, MD;  Location: [facility];  Service: Gynecology;  Laterality: N/A;  . DILATATION & CURRETTAGE/HYSTEROSCOPY WITH RESECTOCOPE N/A 10/17/2012   Procedure: DILATATION & CURETTAGE/HYSTEROSCOPY WITH RESECTOCOPE; Possible Polypectomy, Possible Resectoscopic Myomectomy.;  Surgeon: [name] Stakes. Pamala Hurry, MD;  Location: [facility];  Service: Gynecology;  Laterality: N/A;  1 hr.  Marland Kitchen DILATION AND CURETTAGE OF UTERUS    .  ENDOMETRIAL BIOPSY    . ENDOVENOUS ABLATION SAPHENOUS VEIN W/ LASER Right 05/08/2018   endovenous laser ablation right greater saphenous vein and stab phlebectomy > 20 incisions right leg by [name] Mayo MD   . ESOPHAGOGASTRODUODENOSCOPY ENDOSCOPY  2017   Normal  . HYSTEROSCOPY WITH D & C N/A 12/01/2013   Procedure: IUD Removal;  Surgeon: [name] Stakes. Pamala Hurry, MD;  Location: [facility];  Service: Gynecology;  Laterality: N/A;  90 min.  . INTRAUTERINE DEVICE (IUD) INSERTION N/A 10/17/2012   Procedure: INTRAUTERINE DEVICE (IUD) INSERTION; Mirena;  Surgeon: [name] Stakes. Pamala Hurry, MD;  Location: [facility];  Service: Gynecology;  Laterality: N/A;  . LAPAROSCOPIC GASTRIC SLEEVE RESECTION N/A 02/19/2017   Procedure: LAPAROSCOPIC GASTRIC SLEEVE RESECTION, UPPER ENDOSCOPY;  Surgeon: [name] [place], MD;  Location: [facility];  Service: General;  Laterality: N/A;  . LAPAROSCOPIC SALPINGO OOPHERECTOMY Left 10/29/2016   Procedure: LAPAROSCOPIC SALPINGO OOPHORECTOMY With anterior uterine biopsy;  Surgeon: [name] Dom, MD;  Location: [facility];  Service: Gynecology;  Laterality: Left;  . LASIK  10/06/2018  . LOOP RECORDER INSERTION N/A 06/22/2020   Procedure: LOOP RECORDER INSERTION;  Surgeon: [name] Lance, MD;  Location: [facility];  Service: Cardiovascular;  Laterality: N/A;  . MOUTH SURGERY     wisdom teeth    Allergies  Allergies  Allergen Reactions  . Iohexol Hives  Code: HIVES, Desc: *NEED 13 HR PREDISONE AND BENADRYL  BE FOR IV CONTRAST GIVEN* /DR OLSON PT DEVELOPED HIVES AND ROOF OF MOUTH BEGIN "ITCHING"", Onset Date: 16109604   . Pyridium [Phenazopyridine Hcl] Other (See Comments)    vaginitis symptoms when on it    History of Present Illness    [name] [name] has a PMH of diabetes, hypertension, hyperlipidemia, GERD, IBS, OSA, anxiety, presyncope, and bradycardia.  She was at work and when her usual state of health on 06/21/2020.  She reported staying well-hydrated and was wearing lower  extremity compression stockings.  Suddenly she felt very lightheaded and dizzy as if she may pass out.  She remembered feeling the floor and waking up with her coworkers later.  Shortly after waking up she was completely oriented to self and place.  She reported she had a headache which would always occur after presyncopal episodes.  She reported that this was the second time that she had passed out first time was a few months ago.  She was not noted to be orthostatic.  She reported that she would go months without having an episode and have approximately 1 or 2 a month.  She also reported that there were multiple episodes where she was averaging about 1 episode per day.  Coworkers reported her heart rate was in the 20s.  When EMS arrived on scene her heart rate was improved.  She was admitted to the hospital 06/21/2020 and discharged on 06/23/2020.  She received a loop recorder Medtronic reveal Linq on 06/22/2020.  Her echocardiogram 06/22/2020 showed LVEF of 60-65% and no valvular abnormalities were noted.  She presents the clinic today for follow-up evaluation states she continues to have occasional episodes of dizziness.  She also notes occasional increased heart rates.  We reviewed her loop recorder transmissions and sinus tachycardia.  She reports that she has been drinking a 12 ounce Gatorade Zero daily along with drinking between 70 and 80 ounces of fluid per day.  She is also been increasing her physical activity with increased walking 5 to 6 days/week.  We reviewed her echocardiogram and her recent lab work as well.  She expressed understanding.  We will have her continue her current physical activity, hydration, and follow-up in 3 months.  Remote transmissions are planned monthly.  Today she denies chest pain, shortness of breath, lower extremity edema, fatigue, palpitations, melena, hematuria, hemoptysis, diaphoresis, weakness, presyncope, syncope, orthopnea, and PND.   Home Medications    Prior  to Admission medications   Medication Sig Start Date End Date Taking? Authorizing Provider  APPLE CIDER VINEGAR PO Take 1 tablet by mouth daily.    [provider]  ascorbic acid (VITAMIN C) 500 MG tablet Take 500 mg by mouth daily.    [provider]  Biotin w/ Vitamins C & E (HAIR/SKIN/NAILS PO) Take 1 tablet by mouth daily.     [provider]  Chromium Picolinate 200 MCG CAPS Take 1 capsule by mouth daily.    [provider]  clonazePAM (KLONOPIN) 0.5 MG tablet Take 1 tablet (0.5 mg total) by mouth 2 (two) times daily as needed for anxiety. 01/28/20   Briscoe Deutscher, DO  Coenzyme Q10 (COQ10) 100 MG CAPS Take 1 capsule by mouth daily.    [provider]  cyclobenzaprine (FLEXERIL) 10 MG tablet Take 1 tablet (10 mg total) by mouth 3 (three) times daily as needed for muscle spasms. 05/18/19   [name] D, NP  Docosahexaenoic Acid (DHA COMPLETE  PO) Take 1 tablet by mouth daily.    [provider]  levonorgestrel (MIRENA) 20 MCG/24HR IUD 1 each by Intrauterine route once.    [provider]  loratadine (CLARITIN) 10 MG tablet Take 10 mg by mouth daily as needed for allergies.    [provider]  metFORMIN (GLUCOPHAGE) 500 MG tablet Take 1 tablet (500 mg total) by mouth every morning. 06/14/20   Briscoe Deutscher, DO  Multiple Vitamins-Minerals (BARIATRIC MULTIVITAMINS/IRON PO) Take 1 tablet by mouth in the morning and at bedtime.    [provider]  nystatin cream (MYCOSTATIN) Apply 1 application topically 2 (two) times daily. Apply to affected area BID for up to 7 days. Patient taking differently: Apply 1 application topically 2 (two) times daily as needed for dry skin. 06/04/19   [name] Dom, MD  ondansetron (ZOFRAN) 8 MG tablet Take 1 tablet (8 mg total) by mouth every 8 (eight) hours as needed for nausea or vomiting. 07/09/18   Danford, Valetta Fuller D, NP  Probiotic Product (PROBIOTIC ADVANCED PO) Take 1 capsule by mouth  daily.     [provider]  Spirulina 500 MG TABS Take 1 tablet by mouth daily.    [provider]  SUMAtriptan (IMITREX) 50 MG tablet Take 1 tablet (50 mg total) by mouth every 2 (two) hours as needed for migraine. May repeat in 2 hours if headache persists or recurs. 03/02/20   [name] Reid, PA-C  Vitamin D, Ergocalciferol, (DRISDOL) 1.25 MG (50000 UNIT) CAPS capsule TAKE 1 CAPSULE BY MOUTH EVERY 7 DAYS Patient taking differently: Take 50,000 Units by mouth every 7 (seven) days. Sundays 06/14/20   Briscoe Deutscher, DO    Family History    Family History  Problem Relation Age of Onset  . Diabetes Father   . Thyroid disease Father   . Hypertension Father   . Parkinson's disease Father   . Dementia Father   . Hyperlipidemia Father   . Heart disease Father   . Sleep apnea Father   . Obesity Father   . Diabetes Maternal Grandmother   . Heart disease Maternal Grandmother   . Diabetes Maternal Grandfather   . Heart disease Maternal Grandfather   . Stroke Maternal Grandfather   . Diabetes Paternal Grandmother   . Diabetes Paternal Grandfather   . Heart disease Paternal Grandfather   . Alcohol abuse Paternal Grandfather   . Hypertension Mother   . Diabetes Mother   . Depression Mother   . Obesity Mother   . Colon cancer Maternal Uncle    She indicated that her mother is alive. She indicated that her father is alive. She indicated that her maternal grandmother is deceased. She indicated that her maternal grandfather is deceased. She indicated that her paternal grandmother is deceased. She indicated that her paternal grandfather is deceased. She indicated that the status of her maternal uncle is unknown.  Social History    Social History   Socioeconomic History  . Marital status: Married    Spouse name: Jacie Tristan  . Number of children: 1  . Years of education: Not on file  . Highest education level: Not on file  Occupational History  . 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 Smoking status: Former Smoker    Packs/day: 0.25    Years: 19.00    Pack years: 4.75    Types: Cigarettes    Quit date: 06/06/2016    Years since  quitting: 4.0  . Smokeless tobacco: Never Used  Vaping Use  . Vaping Use: Never used  Substance and Sexual Activity  . Alcohol use: Yes    Comment: 1 a month  . Drug use: No  . Sexual activity: Yes    Partners: Male    Birth control/protection: I.U.D.  Other Topics Concern  . Not on file  Social History Narrative  . Not on file   Social Determinants of Health   Financial Resource Strain: Not on file  Food Insecurity: Not on file  Transportation Needs: Not on file  Physical Activity: Not on file  Stress: Not on file  Social Connections: Not on file  Intimate Partner Violence: Not on file     Review of Systems    General:  No chills, fever, night sweats or weight changes.  Cardiovascular:  No chest pain, dyspnea on exertion, edema, orthopnea, palpitations, paroxysmal nocturnal dyspnea. Dermatological: No rash, lesions/masses Respiratory: No cough, dyspnea Urologic: No hematuria, dysuria Abdominal:   No nausea, vomiting, diarrhea, bright red blood per rectum, melena, or hematemesis Neurologic:  No visual changes, wkns, changes in mental status. All other systems reviewed and are otherwise negative except as noted above.  Physical Exam    VS:  BP 108/72 (BP Location: Left Arm, Patient Position: Sitting, Cuff Size: Large)   Pulse 62   Ht 5\' 8"  (1.727 m)   Wt 241 lb (109.3 kg)   BMI 36.64 kg/m  , BMI Body mass index is 36.64 kg/m. GEN: Well nourished, well developed, in no acute distress. HEENT: normal. Neck: Supple, no JVD, carotid bruits, or masses. Cardiac: RRR, no murmurs, rubs, or gallops. No clubbing, cyanosis, edema.  Radials/DP/PT 2+ and equal bilaterally.  Respiratory:  Respirations regular and unlabored, clear to auscultation bilaterally. GI: Soft,  nontender, nondistended, BS + x 4. MS: no deformity or atrophy. Skin: warm and dry, no rash. Neuro:  Strength and sensation are intact. Psych: Normal affect.  Accessory Clinical Findings    Recent Labs: 06/21/2020: Hemoglobin 13.3; Magnesium 2.0; Platelets 264 06/22/2020: ALT 10; BUN 15; Creatinine, Ser 0.89; Potassium 4.1; Sodium 137; TSH 2.553   Recent Lipid Panel    Component Value Date/Time   CHOL 181 10/14/2019 1050   TRIG 85 10/14/2019 1050   HDL 44 10/14/2019 1050   CHOLHDL 3.7 01/08/2019 0847   CHOLHDL 3.5 03/23/2018 0623   VLDL 8 03/23/2018 0623   LDLCALC 121 (H) 10/14/2019 1050    ECG personally reviewed by me today-none today. Echocardiogram 06/22/2020 IMPRESSIONS    1. Left ventricular ejection fraction, by estimation, is 60 to 65%. The  left ventricle has normal function. The left ventricle has no regional  wall motion abnormalities. There is mild left ventricular hypertrophy.  Left ventricular diastolic parameters  were normal.  2. Right ventricular systolic function is normal. The right ventricular  size is normal. Tricuspid regurgitation signal is inadequate for assessing  PA pressure.  3. The mitral valve is normal in structure. No evidence of mitral valve  regurgitation.  4. The aortic valve was not well visualized. Aortic valve regurgitation  is not visualized. No aortic stenosis is present.  5. The inferior vena cava is normal in size with greater than 50%  respiratory variability, suggesting right atrial pressure of 3 mmHg.  Assessment & Plan   1.  Syncope and collapse-no lightheadedness today.  Has had intermittent episodes of presyncope and syncope.  Presented to the emergency department 06/21/2020 after an episode of syncope at work.  Heart rate reported by coworkers and was in the 42s but had improved when EMS arrived.  Implantable loop recorder started on 06/22/2020.  Device interrogated 07/05/2020 7 episodes noted, no bradycardia, no pauses, one  episode of ST with peak heart rate of 140s.  Patient reported syncopal episode 07/03/2020 around 2050, recording showed sinus rhythm in 70s with no ectopy. Heart healthy diet Liberalize sodium Increase p.o. fluids Lower extremity support stockings Increase physical activity Device check scheduled for 07/24/2020   Follow-up with EP as scheduled and Dr. Harrell Gave in 3 months.  Jossie Ng. Delpha Perko NP-C    07/08/2020, 12:09 PM [address] Office [phone] Fax [phone]  Notice: This dictation was prepared with Dragon dictation along with smaller phrase technology. Any transcriptional errors that result from this process are unintentional and may not be corrected upon review.  I spent 12 minutes examining this patient, reviewing medications, and using patient centered shared decision making involving her cardiac care.  Prior to her visit I spent greater than 20 minutes reviewing her past medical history,  medications, and prior cardiac tests.

## 2020-07-08 ENCOUNTER — Ambulatory Visit (INDEPENDENT_AMBULATORY_CARE_PROVIDER_SITE_OTHER): Payer: No Typology Code available for payment source | Admitting: General Practice

## 2020-07-08 ENCOUNTER — Other Ambulatory Visit: Payer: Self-pay

## 2020-07-08 ENCOUNTER — Encounter: Payer: Self-pay | Admitting: General Practice

## 2020-07-08 VITALS — BP 108/72 | HR 62 | Ht 68.0 in | Wt 241.0 lb

## 2020-07-08 DIAGNOSIS — R55 Syncope and collapse: Secondary | ICD-10-CM | POA: Diagnosis not present

## 2020-07-08 NOTE — Patient Instructions (Signed)
Medication Instructions:  The current medical regimen is effective;  continue present plan and medications as directed. Please refer to the Current Medication list given to you today.  *If you need a refill on your cardiac medications before your next appointment, please call your pharmacy*  Lab Work:   Testing/Procedures:  NONE    NONE  Special Instructions LIBERALIZE SALT INTAKE  PLEASE INCREASE PHYSICAL ACTIVITY AS TOLERATED  MAINTAIN HYDRATION  Follow-Up: Your next appointment:  3 month(s) In Person with Buford Dresser, MD OR IF UNAVAILABLE [place], FNP-C  At [facility], you and your health needs are our priority.  As part of our continuing mission to provide you with exceptional heart care, we have created designated Provider Care Teams.  These Care Teams include your primary Cardiologist (physician) and Advanced Practice Providers (APPs -  Physician Assistants and Nurse Practitioners) who all work together to provide you with the care you need, when you need it.  We recommend signing up for the patient portal called "MyChart".  Sign up information is provided on this After Visit Summary.  MyChart is used to connect with patients for Virtual Visits (Telemedicine).  Patients are able to view lab/test results, encounter notes, upcoming appointments, etc.  Non-urgent messages can be sent to your provider as well.   To learn more about what you can do with MyChart, go to [email or web address].              6 SALTY THINGS TO AVOID     1,800MG  DAILY

## 2020-07-13 NOTE — Telephone Encounter (Signed)
Please see additional phone note on 3/9

## 2020-07-13 NOTE — Telephone Encounter (Signed)
[facility] received paperwork via fax from [employer] [facility] on 3/9, paperwork was handed to [place].

## 2020-07-13 NOTE — Telephone Encounter (Signed)
Contacted patient in let her know paperwork was complete , and she would need to stop by [place] office to sign and complete release and pay $29 fee.

## 2020-07-14 DIAGNOSIS — Z0279 Encounter for issue of other medical certificate: Secondary | ICD-10-CM

## 2020-07-14 NOTE — Telephone Encounter (Signed)
Patient came into office to pay, and pickup copy of paperwork that was faxed on 3/9.

## 2020-07-25 ENCOUNTER — Telehealth: Payer: Self-pay | Admitting: Physician Assistant

## 2020-07-25 NOTE — Telephone Encounter (Signed)
Called patient to discuss her transmission. She is doing OK, having the brief dizzy spells, no syncope Mentions hr pulse ox measures HR 40's-50's with some of her episodes Slowest I noted was 50's She has had a number of symptom episodes for dizziness, lightheaded, palpitations. HR and rhythm have been OK, no arrhythmias, no marked bradycardia or pauses She has had a couple ST episodes, 2 associated with activity (walking and laundry) One false pause episode with low amplitude and artifact.  Given her ongoing symptoms and no clear explanation from HR/rhythm perspective, she would like to move up her visit with Dr. Harrell Gave  I will send a message to our scheduler to see if she can help facilitate that.  Tommye Standard, PA-C

## 2020-07-28 ENCOUNTER — Emergency Department (HOSPITAL_COMMUNITY): Payer: No Typology Code available for payment source

## 2020-07-28 ENCOUNTER — Encounter (HOSPITAL_COMMUNITY): Payer: Self-pay | Admitting: Emergency Medicine

## 2020-07-28 ENCOUNTER — Other Ambulatory Visit: Payer: Self-pay

## 2020-07-28 ENCOUNTER — Emergency Department (HOSPITAL_COMMUNITY)
Admission: EM | Admit: 2020-07-28 | Discharge: 2020-07-28 | Disposition: A | Discharge status: Home / Self Care | Payer: No Typology Code available for payment source | Attending: Emergency Medicine | Admitting: Emergency Medicine

## 2020-07-28 DIAGNOSIS — M79662 Pain in left lower leg: Secondary | ICD-10-CM | POA: Insufficient documentation

## 2020-07-28 DIAGNOSIS — R001 Bradycardia, unspecified: Secondary | ICD-10-CM | POA: Diagnosis not present

## 2020-07-28 DIAGNOSIS — Z79899 Other long term (current) drug therapy: Secondary | ICD-10-CM | POA: Insufficient documentation

## 2020-07-28 DIAGNOSIS — E119 Type 2 diabetes mellitus without complications: Secondary | ICD-10-CM | POA: Insufficient documentation

## 2020-07-28 DIAGNOSIS — R0789 Other chest pain: Secondary | ICD-10-CM | POA: Insufficient documentation

## 2020-07-28 DIAGNOSIS — R079 Chest pain, unspecified: Secondary | ICD-10-CM

## 2020-07-28 DIAGNOSIS — K219 Gastro-esophageal reflux disease without esophagitis: Secondary | ICD-10-CM | POA: Diagnosis not present

## 2020-07-28 DIAGNOSIS — Z87891 Personal history of nicotine dependence: Secondary | ICD-10-CM | POA: Diagnosis not present

## 2020-07-28 DIAGNOSIS — Z7984 Long term (current) use of oral hypoglycemic drugs: Secondary | ICD-10-CM | POA: Insufficient documentation

## 2020-07-28 DIAGNOSIS — Z8616 Personal history of COVID-19: Secondary | ICD-10-CM | POA: Insufficient documentation

## 2020-07-28 DIAGNOSIS — I10 Essential (primary) hypertension: Secondary | ICD-10-CM | POA: Diagnosis not present

## 2020-07-28 LAB — CBC
HCT: 43.1 % (ref 36.0–46.0)
Hemoglobin: 14.5 g/dL (ref 12.0–15.0)
MCH: 29.5 pg (ref 26.0–34.0)
MCHC: 33.6 g/dL (ref 30.0–36.0)
MCV: 87.8 fL (ref 80.0–100.0)
Platelets: 268 10*3/uL (ref 150–400)
RBC: 4.91 MIL/uL (ref 3.87–5.11)
RDW: 12.2 % (ref 11.5–15.5)
WBC: 7.1 10*3/uL (ref 4.0–10.5)
nRBC: 0 % (ref 0.0–0.2)

## 2020-07-28 LAB — BASIC METABOLIC PANEL
Anion gap: 8 (ref 5–15)
BUN: 14 mg/dL (ref 6–20)
CO2: 24 mmol/L (ref 22–32)
Calcium: 9.4 mg/dL (ref 8.9–10.3)
Chloride: 107 mmol/L (ref 98–111)
Creatinine, Ser: 0.99 mg/dL (ref 0.44–1.00)
GFR, Estimated: >60 mL/min (ref >60)
Glucose, Bld: 105 mg/dL — ABNORMAL HIGH (ref 70–99)
Potassium: 4.1 mmol/L (ref 3.5–5.1)
Sodium: 139 mmol/L (ref 135–145)

## 2020-07-28 LAB — I-STAT BETA HCG BLOOD, ED (MC, WL, AP ONLY): I-stat hCG, quantitative: <5 m[IU]/mL (ref <5)

## 2020-07-28 LAB — TROPONIN I (HIGH SENSITIVITY)
Troponin I (High Sensitivity): <2 ng/L (ref <18)
Troponin I (High Sensitivity): <2 ng/L (ref <18)

## 2020-07-28 LAB — D-DIMER, QUANTITATIVE: D-Dimer, Quant: <0.27 ug/mL-FEU (ref 0.00–0.50)

## 2020-07-28 NOTE — ED Provider Notes (Signed)
[name]MENT Provider Note   CSN: [id number] Arrival date & time: 07/28/20  1607     History No chief complaint on file.   [name] [name] is a 40 y.o. female.  HPI Patient is a 40 year old female with a medical history as noted below.  She presents the emergency department with chest tightness.  She states her symptoms started with left calf pain yesterday.  Describes it as "similar to a charley horse".  This began worsening over the past 24 hours.  This morning she woke with constant, mild, left-sided chest tightness.  It waxes and wanes.  Symptoms worsen with deep breathing as well as palpation.  Denies any cough, hemoptysis, leg swelling, prior blood clots, estrogen use.  Patient was admitted in mid February for syncopal episodes.  She states she has a loop recorder in place.  She feels as though she has been significantly less ambulatory since she was hospitalized last month and just went back to work over the past few days.  Echocardiogram on 2/16 shows an LVEF of 60 to 65%.    Past Medical History:  Diagnosis Date   Abnormal uterine bleeding    Amenorrhea    Anemia    Anxiety    panic attacks   Back pain    Bradycardia    Chest pain    Constipation    COVID 02/2020   Diabetes ([place])    Dysmenorrhea    Endometriosis    Gallbladder problem    GERD (gastroesophageal reflux disease)    occasional - diet controlled and tums prn   HLD (hyperlipidemia)    HTN (hypertension)    IBS (irritable bowel syndrome)    Infertility, female    Insulin resistance    Migraine without aura    Missed abortion    x 2 - both resolved without surgery   Obesity    PCOS (polycystic ovarian syndrome)    PCOS (polycystic ovarian syndrome)    PONV (postoperative nausea and vomiting)    pe rpatient ; scopalamine patch very helpful    Sleep apnea    SVD (spontaneous vaginal delivery)    x 1   Syncope and collapse 06/21/2020    Urinary incontinence    Vitamin D deficiency     Patient Active Problem List   Diagnosis Date Noted   Syncope and collapse 06/21/2020   Syncope 06/21/2020   Near syncope 11/04/2019   Hyperlipidemia associated with type 2 diabetes mellitus ([place]) 11/04/2019   Prediabetes 10/15/2019   Vitamin D deficiency 08/06/2019   Constipation 08/06/2019   Endometriosis    Candidiasis, cutaneous 07/14/2018   Flu-like symptoms 07/09/2018   Pharyngitis 07/09/2018   GAD (generalized anxiety disorder) 06/02/2018   Dysuria 03/27/2018   Family history of parkinsonism 03/27/2018   Dizziness 03/27/2018   Bradycardia 03/23/2018   HLD (hyperlipidemia) 03/23/2018   Coronary atherosclerosis 03/23/2018   Chest pain 03/22/2018   Varicose vein of leg 12/24/2017   Venous reflux 12/24/2017   Leukocytosis 09/29/2017   Nausea 09/29/2017   Anxiety 09/29/2017   Pancreatitis 09/28/2017   Dehydration 03/12/2017   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 02/19/2017   Sleep apnea 01/09/2017   Healthcare maintenance 10/18/2016   Type 2 diabetes mellitus without complication, without long-term current use of insulin ([place]) 10/18/2016   LOW BACK PAIN, ACUTE 11/20/2006   POLYCYSTIC OVARY 07/04/2006   OBESITY, NOS 07/04/2006   TOBACCO USE, QUIT 07/04/2006    Past Surgical History:  Procedure Laterality Date   CHOLECYSTECTOMY     COLONOSCOPY  2017   polyps   CYSTOSCOPY N/A 10/29/2016   Procedure: CYSTOSCOPY;  Surgeon: Salvadore Dom, MD;  Location: [facility];  Service: Gynecology;  Laterality: N/A;   DILATATION & CURRETTAGE/HYSTEROSCOPY WITH RESECTOCOPE N/A 10/17/2012   Procedure: DILATATION & CURETTAGE/HYSTEROSCOPY WITH RESECTOCOPE; Possible Polypectomy, Possible Resectoscopic Myomectomy.;  Surgeon: Floyce Stakes. Pamala Hurry, MD;  Location: [facility];  Service: Gynecology;  Laterality: N/A;  1 hr.   DILATION AND CURETTAGE OF UTERUS     ENDOMETRIAL BIOPSY     ENDOVENOUS  ABLATION SAPHENOUS VEIN W/ LASER Right 05/08/2018   endovenous laser ablation right greater saphenous vein and stab phlebectomy > 20 incisions right leg by Deitra Mayo MD    ESOPHAGOGASTRODUODENOSCOPY ENDOSCOPY  2017   Normal   HYSTEROSCOPY WITH D & C N/A 12/01/2013   Procedure: IUD Removal;  Surgeon: Floyce Stakes. Pamala Hurry, MD;  Location: [facility];  Service: Gynecology;  Laterality: N/A;  90 min.   INTRAUTERINE DEVICE (IUD) INSERTION N/A 10/17/2012   Procedure: INTRAUTERINE DEVICE (IUD) INSERTION; Mirena;  Surgeon: Floyce Stakes. Pamala Hurry, MD;  Location: [facility];  Service: Gynecology;  Laterality: N/A;   LAPAROSCOPIC GASTRIC SLEEVE RESECTION N/A 02/19/2017   Procedure: LAPAROSCOPIC GASTRIC SLEEVE RESECTION, UPPER ENDOSCOPY;  Surgeon: Johnathan Hausen, MD;  Location: [facility];  Service: General;  Laterality: N/A;   LAPAROSCOPIC SALPINGO OOPHERECTOMY Left 10/29/2016   Procedure: LAPAROSCOPIC SALPINGO OOPHORECTOMY With anterior uterine biopsy;  Surgeon: Salvadore Dom, MD;  Location: [facility];  Service: Gynecology;  Laterality: Left;   LASIK  10/06/2018   LOOP RECORDER INSERTION N/A 06/22/2020   Procedure: LOOP RECORDER INSERTION;  Surgeon: Evans Lance, MD;  Location: [facility];  Service: Cardiovascular;  Laterality: N/A;   MOUTH SURGERY     wisdom teeth     OB History    Gravida  3   Para  1   Term  1   Preterm      AB  2   Living  1     SAB  2   IAB      Ectopic      Multiple      Live Births  1           Family History  Problem Relation Age of Onset   Diabetes Father    Thyroid disease Father    Hypertension Father    Parkinson's disease Father    Dementia Father    Hyperlipidemia Father    Heart disease Father    Sleep apnea Father    Obesity Father    Diabetes Maternal Grandmother    Heart disease Maternal Grandmother    Diabetes Maternal Grandfather    Heart disease Maternal Grandfather    Stroke Maternal Grandfather     Diabetes Paternal Grandmother    Diabetes Paternal Grandfather    Heart disease Paternal Grandfather    Alcohol abuse Paternal Grandfather    Hypertension Mother    Diabetes Mother    Depression Mother    Obesity Mother    Colon cancer Maternal Uncle     Social History   Tobacco Use   Smoking status: Former Smoker    Packs/day: 0.25    Years: 19.00    Pack years: 4.75    Types: Cigarettes    Quit date: 06/06/2016    Years since quitting: 4.1   Smokeless tobacco: Never Used  Vaping Use   Vaping Use: Never  used  Substance Use Topics   Alcohol use: Yes    Comment: 1 a month   Drug use: No    Home Medications Prior to Admission medications   Medication Sig Start Date End Date Taking? Authorizing Provider  APPLE CIDER VINEGAR PO Take 1 tablet by mouth daily.    [provider]  ascorbic acid (VITAMIN C) 500 MG tablet Take 500 mg by mouth daily.    [provider]  Biotin w/ Vitamins C & E (HAIR/SKIN/NAILS PO) Take 1 tablet by mouth daily.     [provider]  Chromium Picolinate 200 MCG CAPS Take 1 capsule by mouth daily.    [provider]  clonazePAM (KLONOPIN) 0.5 MG tablet Take 1 tablet (0.5 mg total) by mouth 2 (two) times daily as needed for anxiety. 01/28/20   Briscoe Deutscher, DO  Coenzyme Q10 (COQ10) 100 MG CAPS Take 1 capsule by mouth daily.    [provider]  cyclobenzaprine (FLEXERIL) 10 MG tablet Take 1 tablet (10 mg total) by mouth 3 (three) times daily as needed for muscle spasms. 05/18/19   Danford, Valetta Fuller D, NP  Docosahexaenoic Acid (DHA COMPLETE PO) Take 1 tablet by mouth daily.    [provider]  levonorgestrel (MIRENA) 20 MCG/24HR IUD 1 each by Intrauterine route once.    [provider]  loratadine (CLARITIN) 10 MG tablet Take 10 mg by mouth daily as needed for allergies.    [provider]  metFORMIN (GLUCOPHAGE) 500 MG tablet Take 1 tablet (500 mg total) by mouth every morning.  06/14/20   Briscoe Deutscher, DO  Multiple Vitamins-Minerals (BARIATRIC MULTIVITAMINS/IRON PO) Take 1 tablet by mouth in the morning and at bedtime.    [provider]  nystatin cream (MYCOSTATIN) Apply 1 application topically 2 (two) times daily. Apply to affected area BID for up to 7 days. Patient taking differently: Apply 1 application topically 2 (two) times daily as needed for dry skin. 06/04/19   Salvadore Dom, MD  ondansetron (ZOFRAN) 8 MG tablet Take 1 tablet (8 mg total) by mouth every 8 (eight) hours as needed for nausea or vomiting. 07/09/18   Danford, Valetta Fuller D, NP  Probiotic Product (PROBIOTIC ADVANCED PO) Take 1 capsule by mouth daily.     [provider]  Spirulina 500 MG TABS Take 1 tablet by mouth daily.    [provider]  SUMAtriptan (IMITREX) 50 MG tablet Take 1 tablet (50 mg total) by mouth every 2 (two) hours as needed for migraine. May repeat in 2 hours if headache persists or recurs. 03/02/20   Lorrene Reid, PA-C  Vitamin D, Ergocalciferol, (DRISDOL) 1.25 MG (50000 UNIT) CAPS capsule TAKE 1 CAPSULE BY MOUTH EVERY 7 DAYS Patient taking differently: Take 50,000 Units by mouth every 7 (seven) days. Sundays 06/14/20   Briscoe Deutscher, DO    Allergies    Iohexol and Pyridium [phenazopyridine hcl]  Review of Systems   Review of Systems  All other systems reviewed and are negative. Ten systems reviewed and are negative for acute change, except as noted in the HPI.   Physical Exam Updated Vital Signs BP (!) 102/54 (BP Location: Right Arm)    Pulse (!) 53    Temp 98.7 F (37.1 C) (Oral)    Resp 12    Ht 5\' 8"  (1.727 m)    Wt 108.9 kg    SpO2 99%    BMI 36.49 kg/m   Physical Exam Vitals and nursing note reviewed.  Constitutional:      General: She is not in acute distress.    Appearance: Normal appearance. She is not ill-appearing, toxic-appearing or diaphoretic.  HENT:     Head: Normocephalic and atraumatic.     Right Ear: External ear normal.      Left Ear: External ear normal.     Nose: Nose normal.     Mouth/Throat:     Mouth: Mucous membranes are moist.     Pharynx: Oropharynx is clear. No oropharyngeal exudate or posterior oropharyngeal erythema.  Eyes:     General: No scleral icterus.       Right eye: No discharge.        Left eye: No discharge.     Extraocular Movements: Extraocular movements intact.     Conjunctiva/sclera: Conjunctivae normal.  Cardiovascular:     Rate and Rhythm: Normal rate and regular rhythm.     Pulses: Normal pulses.     Heart sounds: Normal heart sounds. No murmur heard. No friction rub. No gallop.   Pulmonary:     Effort: Pulmonary effort is normal. No respiratory distress.     Breath sounds: Normal breath sounds. No stridor. No wheezing, rhonchi or rales.     Comments: Mild left-sided anterior chest wall tenderness.  No crepitus. Chest:     Chest wall: Tenderness present.  Abdominal:     General: Abdomen is flat.     Tenderness: There is no abdominal tenderness.  Musculoskeletal:        General: Tenderness present. Normal range of motion.     Cervical back: Normal range of motion and neck supple. No tenderness.     Comments: Mild left-sided calf pain.  No palpable cords.  No leg edema.  Skin:    General: Skin is warm and dry.  Neurological:     General: No focal deficit present.     Mental Status: She is alert and oriented to person, place, and time.  Psychiatric:        Mood and Affect: Mood normal.        Behavior: Behavior normal.    ED Results / Procedures / Treatments   Labs (all labs ordered are listed, but only abnormal results are displayed) Labs Reviewed  BASIC METABOLIC PANEL - Abnormal; Notable for the following components:      Result Value   Glucose, Bld 105 (*)    All other components within normal limits  CBC  D-DIMER, QUANTITATIVE  I-STAT BETA HCG BLOOD, ED ([facility], [facility], [facility] ONLY)  TROPONIN I (HIGH SENSITIVITY)  TROPONIN I (HIGH SENSITIVITY)   EKG EKG  Interpretation  Date/Time:  Thursday July 28 2020 09:54:03 EDT Ventricular Rate:  49 PR Interval:  158 QRS Duration: 116 QT Interval:  451 QTC Calculation: 408 R Axis:   60 Text Interpretation: Sinus bradycardia Confirmed by Lennice Sites [phone]) on 07/28/2020 9:56:10 AM  Radiology DG Chest 2 View  Result Date: 07/28/2020 CLINICAL DATA:  Chest pain. EXAM: CHEST - 2 VIEW COMPARISON:  06/08/2020. FINDINGS: Mediastinum and hilar structures normal. Cardiac monitor device noted. Heart size normal. Low lung volumes. Lungs are clear. No pleural effusion or pneumothorax. Thoracic spine scoliosis. Surgical clips right upper quadrant. IMPRESSION: No acute cardiopulmonary disease. Electronically Signed   By: Marcello Moores  Register   On: 07/28/2020 07:46   Procedures Procedures   Medications Ordered in ED Medications - No data to display  ED Course  I have reviewed the triage vital signs and the nursing notes.  Pertinent  labs & imaging results that were available during my care of the patient were reviewed by me and considered in my medical decision making (see chart for details).    MDM Rules/Calculators/A&P                          Pt is a 40 y.o. female who presents to the ED with chest tightness and left calf pain.   Labs: CBC without abnormalities. BMP with a glucose of 105. Troponin less than 2. D-dimer less than 0.27.  Imaging: CXR is negative.  ECG: Initial ECG shows normal sinus rhythm with a repeat showing sinus bradycardia.  I, Rayna Sexton, PA-C, personally reviewed and evaluated these images and lab results as part of my medical decision-making.  Unsure of the cause of patients sx. She recently started working again and notes a history of anxiety. Likely msk given patient's recent return to work and being on her feet regularly. She is PERC negative.  D-dimer less than 0.27.  Doubt DVT/PE at this time.  Troponin less than 2.  Chest pain reproducible with palpation.  Doubt  ACS.  Feel that she is stable for discharge at this time and she is agreeable.  Recommended following up with cardiology if her symptoms do not improve.  Return to the emergency department if they worsen.  Her questions were answered and she was amicable to time of discharge.  Her vital signs are stable.  Note: Portions of this report may have been transcribed using voice recognition software. Every effort was made to ensure accuracy; however, inadvertent computerized transcription errors may be present.   Final Clinical Impression(s) / ED Diagnoses Final diagnoses:  Pain of left calf  Chest pain, unspecified type   Rx / DC Orders ED Discharge Orders    None       Rayna Sexton, PA-C 07/28/20 [place], [place], DO 07/28/20 1239

## 2020-07-28 NOTE — ED Triage Notes (Signed)
Pt here from work with c/o chest pain along with n/v and some slight sob

## 2020-07-28 NOTE — ED Notes (Signed)
Patient states she is feeling dizzy, BP lower.  Reassessed patients BP and patient, she stated that she was starting to feel better.  ED MD made aware.

## 2020-07-28 NOTE — Discharge Instructions (Addendum)
Like we discussed, if your symptoms persist please follow-up with cardiology.  Otherwise, please return to the emergency department if they worsen.  It was a pleasure to meet you.

## 2020-07-30 LAB — CUP PACEART REMOTE DEVICE CHECK
Date Time Interrogation Session: 20220326155735
Implantable Pulse Generator Implant Date: 20220216

## 2020-08-01 ENCOUNTER — Ambulatory Visit (INDEPENDENT_AMBULATORY_CARE_PROVIDER_SITE_OTHER): Payer: No Typology Code available for payment source

## 2020-08-01 DIAGNOSIS — R55 Syncope and collapse: Secondary | ICD-10-CM | POA: Diagnosis not present

## 2020-08-11 ENCOUNTER — Other Ambulatory Visit (HOSPITAL_COMMUNITY): Payer: Self-pay

## 2020-08-11 NOTE — Progress Notes (Incomplete)
40 y.o. [id number] Married White or Caucasian Not Hispanic or Latino female here for annual exam.      No LMP recorded. (Menstrual status: IUD).          Sexually active: {yes no:314532}  The current method of family planning is {contraception:315051}.    Exercising: {yes no:314532}  {types:19826} Smoker:  {YES NO:22349}  Health Maintenance: Pap:  03-14-16 WNL NEG HR HPV History of abnormal Pap:  Yes 2004--repeat PAP normal MMG:  None  BMD:   *** Colonoscopy: *** TDaP:  *** Gardasil: ***   reports that she quit smoking about 4 years ago. Her smoking use included cigarettes. She has a 4.75 pack-year smoking history. She has never used smokeless tobacco. She reports current alcohol use. She reports that she does not use drugs.  Past Medical History:  Diagnosis Date  . Abnormal uterine bleeding   . Amenorrhea   . Anemia   . Anxiety    panic attacks  . Back pain   . Bradycardia   . Chest pain   . Constipation   . COVID 02/2020  . Diabetes ([place])   . Dysmenorrhea   . Endometriosis   . Gallbladder problem   . GERD (gastroesophageal reflux disease)    occasional - diet controlled and tums prn  . HLD (hyperlipidemia)   . HTN (hypertension)   . IBS (irritable bowel syndrome)   . Infertility, female   . Insulin resistance   . Migraine without aura   . Missed abortion    x 2 - both resolved without surgery  . Obesity   . PCOS (polycystic ovarian syndrome)   . PCOS (polycystic ovarian syndrome)   . PONV (postoperative nausea and vomiting)    pe rpatient ; scopalamine patch very helpful   . Sleep apnea   . SVD (spontaneous vaginal delivery)    x 1  . Syncope and collapse 06/21/2020  . Urinary incontinence   . Vitamin D deficiency     Past Surgical History:  Procedure Laterality Date  . CHOLECYSTECTOMY    . COLONOSCOPY  2017   polyps  . CYSTOSCOPY N/A 10/29/2016   Procedure: CYSTOSCOPY;  Surgeon: Salvadore Dom, MD;  Location: [facility];  Service: Gynecology;   Laterality: N/A;  . DILATATION & CURRETTAGE/HYSTEROSCOPY WITH RESECTOCOPE N/A 10/17/2012   Procedure: DILATATION & CURETTAGE/HYSTEROSCOPY WITH RESECTOCOPE; Possible Polypectomy, Possible Resectoscopic Myomectomy.;  Surgeon: Floyce Stakes. Pamala Hurry, MD;  Location: [facility];  Service: Gynecology;  Laterality: N/A;  1 hr.  Marland Kitchen DILATION AND CURETTAGE OF UTERUS    . ENDOMETRIAL BIOPSY    . ENDOVENOUS ABLATION SAPHENOUS VEIN W/ LASER Right 05/08/2018   endovenous laser ablation right greater saphenous vein and stab phlebectomy > 20 incisions right leg by Deitra Mayo MD   . ESOPHAGOGASTRODUODENOSCOPY ENDOSCOPY  2017   Normal  . HYSTEROSCOPY WITH D & C N/A 12/01/2013   Procedure: IUD Removal;  Surgeon: Floyce Stakes. Pamala Hurry, MD;  Location: [facility];  Service: Gynecology;  Laterality: N/A;  90 min.  . INTRAUTERINE DEVICE (IUD) INSERTION N/A 10/17/2012   Procedure: INTRAUTERINE DEVICE (IUD) INSERTION; Mirena;  Surgeon: Floyce Stakes. Pamala Hurry, MD;  Location: [facility];  Service: Gynecology;  Laterality: N/A;  . LAPAROSCOPIC GASTRIC SLEEVE RESECTION N/A 02/19/2017   Procedure: LAPAROSCOPIC GASTRIC SLEEVE RESECTION, UPPER ENDOSCOPY;  Surgeon: Johnathan Hausen, MD;  Location: [facility];  Service: General;  Laterality: N/A;  . LAPAROSCOPIC SALPINGO OOPHERECTOMY Left 10/29/2016   Procedure: LAPAROSCOPIC SALPINGO OOPHORECTOMY With anterior uterine biopsy;  Surgeon:  Salvadore Dom, MD;  Location: [facility];  Service: Gynecology;  Laterality: Left;  . LASIK  10/06/2018  . LOOP RECORDER INSERTION N/A 06/22/2020   Procedure: LOOP RECORDER INSERTION;  Surgeon: Evans Lance, MD;  Location: [facility];  Service: Cardiovascular;  Laterality: N/A;  . MOUTH SURGERY     wisdom teeth    Current Outpatient Medications  Medication Sig Dispense Refill  . APPLE CIDER VINEGAR PO Take 1 tablet by mouth daily.    Marland Kitchen ascorbic acid (VITAMIN C) 500 MG tablet Take 500 mg by mouth daily.    . Biotin w/ Vitamins C & E (HAIR/SKIN/NAILS PO)  Take 1 tablet by mouth daily.     . Chromium Picolinate 200 MCG CAPS Take 1 capsule by mouth daily.    . clonazePAM (KLONOPIN) 0.5 MG tablet Take 1 tablet (0.5 mg total) by mouth 2 (two) times daily as needed for anxiety. 20 tablet 1  . Coenzyme Q10 (COQ10) 100 MG CAPS Take 1 capsule by mouth daily.    . cyclobenzaprine (FLEXERIL) 10 MG tablet Take 1 tablet (10 mg total) by mouth 3 (three) times daily as needed for muscle spasms. 30 tablet 0  . Docosahexaenoic Acid (DHA COMPLETE PO) Take 1 tablet by mouth daily.    . fluconazole (DIFLUCAN) 150 MG tablet TAKE 1 TABLET BY MOUTH ONCE FOR 1 DOSE. REPEAT IN 72 HOURS IF SYMPTOMS ARE NOT COMPLETELY RESOLVED. 2 tablet 0  . influenza vac split quadrivalent PF (FLUARIX) 0.5 ML injection TO BE ADMINISTERED BY PHARMACIST .5 mL 0  . levonorgestrel (MIRENA) 20 MCG/24HR IUD 1 each by Intrauterine route once.    . loratadine (CLARITIN) 10 MG tablet Take 10 mg by mouth daily as needed for allergies.    . metFORMIN (GLUCOPHAGE) 500 MG tablet TAKE 1 TABLET BY MOUTH EVERY MORNING. 90 tablet 0  . Multiple Vitamins-Minerals (BARIATRIC MULTIVITAMINS/IRON PO) Take 1 tablet by mouth in the morning and at bedtime.    Marland Kitchen nystatin cream (MYCOSTATIN) Apply 1 application topically 2 (two) times daily. Apply to affected area BID for up to 7 days. (Patient taking differently: Apply 1 application topically 2 (two) times daily as needed for dry skin.) 30 g 1  . ondansetron (ZOFRAN) 8 MG tablet Take 1 tablet (8 mg total) by mouth every 8 (eight) hours as needed for nausea or vomiting. 20 tablet 0  . Probiotic Product (PROBIOTIC ADVANCED PO) Take 1 capsule by mouth daily.     Marland Kitchen Spirulina 500 MG TABS Take 1 tablet by mouth daily.    . SUMAtriptan (IMITREX) 50 MG tablet TAKE 1 TABLET BY MOUTH AT ONSET OF MIGRAINE, MAY REPEAT IN 2 HOURS IF HEADACHE PERSISTS 10 tablet 0  . Vitamin D, Ergocalciferol, (DRISDOL) 1.25 MG (50000 UNIT) CAPS capsule TAKE 1 CAPSULE BY MOUTH EVERY 7 DAYS (Patient  taking differently: Take 50,000 Units by mouth every 7 (seven) days. Sundays) 12 capsule 0   No current facility-administered medications for this visit.    Family History  Problem Relation Age of Onset  . Diabetes Father   . Thyroid disease Father   . Hypertension Father   . Parkinson's disease Father   . Dementia Father   . Hyperlipidemia Father   . Heart disease Father   . Sleep apnea Father   . Obesity Father   . Diabetes Maternal Grandmother   . Heart disease Maternal Grandmother   . Diabetes Maternal Grandfather   . Heart disease Maternal Grandfather   .  Stroke Maternal Grandfather   . Diabetes Paternal Grandmother   . Diabetes Paternal Grandfather   . Heart disease Paternal Grandfather   . Alcohol abuse Paternal Grandfather   . Hypertension Mother   . Diabetes Mother   . Depression Mother   . Obesity Mother   . Colon cancer Maternal Uncle     Review of Systems  Exam:   There were no vitals taken for this visit.  Weight change: @WEIGHTCHANGE @ Height:      Ht Readings from Last 3 Encounters:  07/28/20 5\' 8"  (1.727 m)  07/08/20 5\' 8"  (1.727 m)  06/21/20 5\' 8"  (1.727 m)    General appearance: alert, cooperative and appears stated age Head: Normocephalic, without obvious abnormality, atraumatic Neck: no adenopathy, supple, symmetrical, trachea midline and thyroid {CHL AMB PHY EX THYROID NORM [phone]::"normal to inspection and palpation"} Lungs: clear to auscultation bilaterally Cardiovascular: regular rate and rhythm Breasts: {Exam; breast:13139::"normal appearance, no masses or tenderness"} Abdomen: soft, non-tender; non distended,  no masses,  no organomegaly Extremities: extremities normal, atraumatic, no cyanosis or edema Skin: Skin color, texture, turgor normal. No rashes or lesions Lymph nodes: Cervical, supraclavicular, and axillary nodes normal. No abnormal inguinal nodes palpated Neurologic: Grossly normal   Pelvic: External genitalia:  no  lesions              Urethra:  normal appearing urethra with no masses, tenderness or lesions              Bartholins and Skenes: normal                 Vagina: normal appearing vagina with normal color and discharge, no lesions              Cervix: {CHL AMB PHY EX CERVIX NORM DEFAULT:[phone]::"no lesions"}               Bimanual Exam:  Uterus:  {CHL AMB PHY EX UTERUS NORM [phone]::"normal size, contour, position, consistency, mobility, non-tender"}              Adnexa: {CHL AMB PHY EX ADNEXA NO MASS [phone]::"no mass, fullness, tenderness"}               Rectovaginal: Confirms               Anus:  normal sphincter tone, no lesions  *** chaperoned for the exam.  A:  Well Woman with normal exam  P:

## 2020-08-12 ENCOUNTER — Other Ambulatory Visit (HOSPITAL_COMMUNITY): Payer: Self-pay

## 2020-08-12 ENCOUNTER — Other Ambulatory Visit: Payer: Self-pay

## 2020-08-12 ENCOUNTER — Ambulatory Visit (INDEPENDENT_AMBULATORY_CARE_PROVIDER_SITE_OTHER): Payer: No Typology Code available for payment source | Admitting: Obstetrics and Gynecology

## 2020-08-12 ENCOUNTER — Ambulatory Visit: Payer: No Typology Code available for payment source | Admitting: Obstetrics and Gynecology

## 2020-08-12 ENCOUNTER — Encounter: Payer: Self-pay | Admitting: Obstetrics and Gynecology

## 2020-08-12 VITALS — BP 120/80

## 2020-08-12 DIAGNOSIS — R102 Pelvic and perineal pain: Secondary | ICD-10-CM

## 2020-08-12 DIAGNOSIS — N898 Other specified noninflammatory disorders of vagina: Secondary | ICD-10-CM | POA: Diagnosis not present

## 2020-08-12 DIAGNOSIS — B9689 Other specified bacterial agents as the cause of diseases classified elsewhere: Secondary | ICD-10-CM

## 2020-08-12 DIAGNOSIS — N760 Acute vaginitis: Secondary | ICD-10-CM | POA: Diagnosis not present

## 2020-08-12 LAB — WET PREP FOR TRICH, YEAST, CLUE

## 2020-08-12 MED ORDER — METRONIDAZOLE 500 MG PO TABS
500.0000 mg | ORAL_TABLET | Freq: Two times a day (BID) | ORAL | 0 refills | Status: DC
  Filled 2020-08-12: qty 14, 7d supply, fill #0

## 2020-08-12 NOTE — Progress Notes (Signed)
jj

## 2020-08-12 NOTE — Progress Notes (Signed)
GYNECOLOGY  VISIT   HPI: 40 y.o.   Married White or Caucasian Not Hispanic or Latino  female   [phone] with No LMP recorded. (Menstrual status: IUD).   here for pelvic pain-RLQ. The pain as been intermittent for the last few months. The pain has been constant for the last 5-6 days. The pain is cramping, stabbing, waxes and wanes some. The pain ranges from a 2-8/10 in severity. She can have nausea with bad pain. Some diarrhea 2 weeks ago, currently BM's are normal. No fevers, no emesis, no bladder c/o. Some deep dyspareunia recently, somewhat positional.    She has a h/o a LSO. She has a mirena IUD, placed in 12/17. No cycles, has occasional spotting. Sexually active, same partner x 9 months, living together. Not always using condoms. Negative STD testing in 2/22.  C/o 2 day h/o an increase in vaginal discharge with a slight odor, mild itching.   She is being evaluated by Cardiology  for pre syncope and syncope since 12/21. Having some bradycardia episodes. Can go days or weeks and is fine.   GYNECOLOGIC HISTORY: No LMP recorded. (Menstrual status: IUD). Contraception:IUD Menopausal hormone therapy:None        OB History    Gravida  3   Para  1   Term  1   Preterm      AB  2   Living  1     SAB  2   IAB      Ectopic      Multiple      Live Births  1              Patient Active Problem List   Diagnosis Date Noted  . Syncope and collapse 06/21/2020  . Syncope 06/21/2020  . Near syncope 11/04/2019  . Hyperlipidemia associated with type 2 diabetes mellitus ([place]) 11/04/2019  . Prediabetes 10/15/2019  . Vitamin D deficiency 08/06/2019  . Constipation 08/06/2019  . Endometriosis   . Candidiasis, cutaneous 07/14/2018  . Flu-like symptoms 07/09/2018  . Pharyngitis 07/09/2018  . GAD (generalized anxiety disorder) 06/02/2018  . Dysuria 03/27/2018  . Family history of parkinsonism 03/27/2018  . Dizziness 03/27/2018  . Bradycardia 03/23/2018  . HLD  (hyperlipidemia) 03/23/2018  . Coronary atherosclerosis 03/23/2018  . Chest pain 03/22/2018  . Varicose vein of leg 12/24/2017  . Venous reflux 12/24/2017  . Leukocytosis 09/29/2017  . Nausea 09/29/2017  . Anxiety 09/29/2017  . Pancreatitis 09/28/2017  . Dehydration 03/12/2017  . 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 Sleep apnea 01/09/2017  . Healthcare maintenance 10/18/2016  . Type 2 diabetes mellitus without complication, without long-term current use of insulin ([place]) 10/18/2016  . LOW BACK PAIN, ACUTE 11/20/2006  . POLYCYSTIC OVARY 07/04/2006  . OBESITY, NOS 07/04/2006  . TOBACCO USE, QUIT 07/04/2006    Past Medical History:  Diagnosis Date  . Abnormal uterine bleeding   . Amenorrhea   . Anemia   . Anxiety    panic attacks  . Back pain   . Bradycardia   . Chest pain   . Constipation   . COVID 02/2020  . Diabetes ([place])   . Dysmenorrhea   . Endometriosis   . Gallbladder problem   . GERD (gastroesophageal reflux disease)    occasional - diet controlled and tums prn  . HLD (hyperlipidemia)   . HTN (hypertension)   . IBS (irritable bowel syndrome)   . Infertility, female   . Insulin resistance   .  Migraine without aura   . Missed abortion    x 2 - both resolved without surgery  . Obesity   . PCOS (polycystic ovarian syndrome)   . PCOS (polycystic ovarian syndrome)   . PONV (postoperative nausea and vomiting)    pe rpatient ; scopalamine patch very helpful   . Sleep apnea   . SVD (spontaneous vaginal delivery)    x 1  . Syncope and collapse 06/21/2020  . Urinary incontinence   . Vitamin D deficiency     Past Surgical History:  Procedure Laterality Date  . CHOLECYSTECTOMY    . COLONOSCOPY  2017   polyps  . CYSTOSCOPY N/A 10/29/2016   Procedure: CYSTOSCOPY;  Surgeon: Salvadore Dom, MD;  Location: [facility];  Service: Gynecology;  Laterality: N/A;  . DILATATION & CURRETTAGE/HYSTEROSCOPY WITH RESECTOCOPE N/A 10/17/2012   Procedure:  DILATATION & CURETTAGE/HYSTEROSCOPY WITH RESECTOCOPE; Possible Polypectomy, Possible Resectoscopic Myomectomy.;  Surgeon: Floyce Stakes. Pamala Hurry, MD;  Location: [facility];  Service: Gynecology;  Laterality: N/A;  1 hr.  Marland Kitchen DILATION AND CURETTAGE OF UTERUS    . ENDOMETRIAL BIOPSY    . ENDOVENOUS ABLATION SAPHENOUS VEIN W/ LASER Right 05/08/2018   endovenous laser ablation right greater saphenous vein and stab phlebectomy > 20 incisions right leg by Deitra Mayo MD   . ESOPHAGOGASTRODUODENOSCOPY ENDOSCOPY  2017   Normal  . HYSTEROSCOPY WITH D & C N/A 12/01/2013   Procedure: IUD Removal;  Surgeon: Floyce Stakes. Pamala Hurry, MD;  Location: [facility];  Service: Gynecology;  Laterality: N/A;  90 min.  . INTRAUTERINE DEVICE (IUD) INSERTION N/A 10/17/2012   Procedure: INTRAUTERINE DEVICE (IUD) INSERTION; Mirena;  Surgeon: Floyce Stakes. Pamala Hurry, MD;  Location: [facility];  Service: Gynecology;  Laterality: N/A;  . LAPAROSCOPIC GASTRIC SLEEVE RESECTION N/A 02/19/2017   Procedure: LAPAROSCOPIC GASTRIC SLEEVE RESECTION, UPPER ENDOSCOPY;  Surgeon: Johnathan Hausen, MD;  Location: [facility];  Service: General;  Laterality: N/A;  . LAPAROSCOPIC SALPINGO OOPHERECTOMY Left 10/29/2016   Procedure: LAPAROSCOPIC SALPINGO OOPHORECTOMY With anterior uterine biopsy;  Surgeon: Salvadore Dom, MD;  Location: [facility];  Service: Gynecology;  Laterality: Left;  . LASIK  10/06/2018  . LOOP RECORDER INSERTION N/A 06/22/2020   Procedure: LOOP RECORDER INSERTION;  Surgeon: Evans Lance, MD;  Location: [facility];  Service: Cardiovascular;  Laterality: N/A;  . MOUTH SURGERY     wisdom teeth    Current Outpatient Medications  Medication Sig Dispense Refill  . APPLE CIDER VINEGAR PO Take 1 tablet by mouth daily.    Marland Kitchen ascorbic acid (VITAMIN C) 500 MG tablet Take 500 mg by mouth daily.    . Biotin w/ Vitamins C & E (HAIR/SKIN/NAILS PO) Take 1 tablet by mouth daily.     . Chromium Picolinate 200 MCG CAPS Take 1 capsule by mouth daily.     . clonazePAM (KLONOPIN) 0.5 MG tablet Take 1 tablet (0.5 mg total) by mouth 2 (two) times daily as needed for anxiety. 20 tablet 1  . Coenzyme Q10 (COQ10) 100 MG CAPS Take 1 capsule by mouth daily.    . cyclobenzaprine (FLEXERIL) 10 MG tablet Take 1 tablet (10 mg total) by mouth 3 (three) times daily as needed for muscle spasms. 30 tablet 0  . Docosahexaenoic Acid (DHA COMPLETE PO) Take 1 tablet by mouth daily.    Marland Kitchen levonorgestrel (MIRENA) 20 MCG/24HR IUD 1 each by Intrauterine route once.    . loratadine (CLARITIN) 10 MG tablet Take 10 mg by mouth daily as needed  for allergies.    . metFORMIN (GLUCOPHAGE) 500 MG tablet TAKE 1 TABLET BY MOUTH EVERY MORNING. 90 tablet 0  . Multiple Vitamins-Minerals (BARIATRIC MULTIVITAMINS/IRON PO) Take 1 tablet by mouth in the morning and at bedtime.    Marland Kitchen nystatin cream (MYCOSTATIN) Apply 1 application topically 2 (two) times daily. Apply to affected area BID for up to 7 days. (Patient taking differently: Apply 1 application topically 2 (two) times daily as needed for dry skin.) 30 g 1  . ondansetron (ZOFRAN) 8 MG tablet Take 1 tablet (8 mg total) by mouth every 8 (eight) hours as needed for nausea or vomiting. 20 tablet 0  . Probiotic Product (PROBIOTIC ADVANCED PO) Take 1 capsule by mouth daily.     Marland Kitchen Spirulina 500 MG TABS Take 1 tablet by mouth daily.    . SUMAtriptan (IMITREX) 50 MG tablet TAKE 1 TABLET BY MOUTH AT ONSET OF MIGRAINE, MAY REPEAT IN 2 HOURS IF HEADACHE PERSISTS 10 tablet 0  . Vitamin D, Ergocalciferol, (DRISDOL) 1.25 MG (50000 UNIT) CAPS capsule TAKE 1 CAPSULE BY MOUTH EVERY 7 DAYS (Patient taking differently: Take 50,000 Units by mouth every 7 (seven) days. Sundays) 12 capsule 0  . fluconazole (DIFLUCAN) 150 MG tablet TAKE 1 TABLET BY MOUTH ONCE FOR 1 DOSE. REPEAT IN 72 HOURS IF SYMPTOMS ARE NOT COMPLETELY RESOLVED. (Patient not taking: Reported on 08/12/2020) 2 tablet 0  . influenza vac split quadrivalent PF (FLUARIX) 0.5 ML injection TO BE  ADMINISTERED BY PHARMACIST (Patient not taking: Reported on 08/12/2020) .5 mL 0   No current facility-administered medications for this visit.     ALLERGIES: Iohexol and Pyridium [phenazopyridine hcl]  Family History  Problem Relation Age of Onset  . Diabetes Father   . Thyroid disease Father   . Hypertension Father   . Parkinson's disease Father   . Dementia Father   . Hyperlipidemia Father   . Heart disease Father   . Sleep apnea Father   . Obesity Father   . Diabetes Maternal Grandmother   . Heart disease Maternal Grandmother   . Diabetes Maternal Grandfather   . Heart disease Maternal Grandfather   . Stroke Maternal Grandfather   . Diabetes Paternal Grandmother   . Diabetes Paternal Grandfather   . Heart disease Paternal Grandfather   . Alcohol abuse Paternal Grandfather   . Hypertension Mother   . Diabetes Mother   . Depression Mother   . Obesity Mother   . Colon cancer Maternal Uncle     Social History   Socioeconomic History  . Marital status: Married    Spouse name: Zina Pitzer  . Number of children: 1  . Years of education: Not on file  . Highest education level: Not on file  Occupational History  . 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 Smoking status: Former Smoker    Packs/day: 0.25    Years: 19.00    Pack years: 4.75    Types: Cigarettes    Quit date: 06/06/2016    Years since quitting: 4.1  . Smokeless tobacco: Never Used  Vaping Use  . Vaping Use: Never used  Substance and Sexual Activity  . Alcohol use: Yes    Comment: 1 a month  . Drug use: No  . Sexual activity: Yes    Partners: Male    Birth control/protection: I.U.D.  Other Topics Concern  . Not on file  Social History Narrative  . Not on file  Social Determinants of Health   Financial Resource Strain: Not on file  Food Insecurity: Not on file  Transportation Needs: Not on file  Physical Activity: Not on file  Stress: Not on file   Social Connections: Not on file  Intimate Partner Violence: Not on file    ROS: see above, otherwise negative  PHYSICAL EXAMINATION:    BP 120/80     General appearance: alert, cooperative and appears stated age Abdomen: soft, tender in there RLQ, no rebound, no guarding. Non distended, no masses,  no organomegaly  Pelvic: External genitalia:  no lesions              Urethra:  normal appearing urethra with no masses, tenderness or lesions              Bartholins and Skenes: normal                 Vagina: normal appearing vagina with an increase in frothy, white, watery vaginal discharge              Cervix: no cervical motion tenderness, no lesions and IUD string 3-4 cm              Bimanual Exam:  Uterus:  no masses or tenderness              Adnexa: no masses, tender on the right              Rectovaginal: Yes.  .  Confirms.              Anus:  normal sphincter tone, no lesions  Chaperone was present for exam.  1. Pelvic pain Suspect ovarian cyst She has some NSAID's at home - US PELVIS TRANSVAGINAL NON-OB (TV ONLY); Future  2. Vaginal discharge - WET PREP FOR [place], YEAST, CLUE: + BV This is her second infection since 7/21  3. Bacterial vaginitis - metroNIDAZOLE (FLAGYL) 500 MG tablet; Take 1 tablet (500 mg total) by mouth 2 (two) times daily.  Dispense: 14 tablet; Refill: 0

## 2020-08-12 NOTE — Patient Instructions (Signed)
Bacterial Vaginosis  Bacterial vaginosis is an infection that occurs when the normal balance of bacteria in the vagina changes. This change is caused by an overgrowth of certain bacteria in the vagina. Bacterial vaginosis is the most common vaginal infection among females aged 40 to 101 years. This condition increases the risk of sexually transmitted infections (STIs). Treatment can help reduce this risk. Treatment is very important for pregnant women because this condition can cause babies to be born early (prematurely) or at a low birth weight. What are the causes? This condition is caused by an increase in harmful bacteria that are normally present in small amounts in the vagina. However, the exact reason this condition develops is not known. You cannot get bacterial vaginosis from toilet seats, bedding, swimming pools, or contact with objects around you. What increases the risk? The following factors may make you more likely to develop this condition:  Having a new sexual partner or multiple sexual partners, or having unprotected sex.  Douching.  Having an intrauterine device (IUD).  Smoking.  Abusing drugs and alcohol. This may lead to riskier sexual behavior.  Taking certain antibiotic medicines.  Being pregnant. What are the signs or symptoms? Some women with this condition have no symptoms. Symptoms may include:  Pearline Cables or white vaginal discharge. The discharge can be watery or foamy.  A fish-like odor with discharge, especially after sex or during menstruation.  Itching in and around the vagina.  Burning or pain with urination. How is this diagnosed? This condition is diagnosed based on:  Your medical history.  A physical exam of the vagina.  Checking a sample of vaginal fluid for harmful bacteria or abnormal cells. How is this treated? This condition is treated with antibiotic medicines. These may be given as a pill, a vaginal cream, or a medicine that is put into the  vagina (suppository). If the condition comes back after treatment, a second round of antibiotics may be needed. Follow these instructions at home: Medicines  Take or apply over-the-counter and prescription medicines only as told by your health care provider.  Take or apply your antibiotic medicine as told by your health care provider. Do not stop using the antibiotic even if you start to feel better. General instructions  If you have a female sexual partner, tell her that you have a vaginal infection. She should follow up with her health care provider. If you have a female sexual partner, he does not need treatment.  Avoid sexual activity until you finish treatment.  Drink enough fluid to keep your urine pale yellow.  Keep the area around your vagina and rectum clean. ? Wash the area daily with warm water. ? Wipe yourself from front to back after using the toilet.  If you are breastfeeding, talk to your health care provider about continuing breastfeeding during treatment.  Keep all follow-up visits. This is important. How is this prevented? Self-care  Do not douche.  Wash the outside of your vagina with warm water only.  Wear cotton or cotton-lined underwear.  Avoid wearing tight pants and pantyhose, especially during the summer. Safe sex  Use protection when having sex. This includes: ? Using condoms. ? Using dental dams. This is a thin layer of a material made of latex or polyurethane that protects the mouth during oral sex.  Limit the number of sexual partners. To help prevent bacterial vaginosis, it is best to have sex with just one partner (monogamous relationship).  Make sure you and your sexual partner  are tested for STIs. Drugs and alcohol  Do not use any products that contain nicotine or tobacco. These products include cigarettes, chewing tobacco, and vaping devices, such as e-cigarettes. If you need help quitting, ask your health care provider.  Do not use  drugs.  Do not drink alcohol if: ? Your health care provider tells you not to do this. ? You are pregnant, may be pregnant, or are planning to become pregnant.  If you drink alcohol: ? Limit how much you have to 0-1 drink a day. ? Be aware of how much alcohol is in your drink. In the [place] one drink equals one 12 oz bottle of beer (355 mL), one 5 oz glass of wine (148 mL), or one 1 oz glass of hard liquor (44 mL). Where to find more information  Centers for Disease Control and Prevention: http://www.wolf.info/  American Sexual Health Association (ASHA): www.ashastd.org  U.S. Department of Health and Financial controller, Office on Women's Health: [place].tn Contact a health care provider if:  Your symptoms do not improve, even after treatment.  You have more discharge or pain when urinating.  You have a fever or chills.  You have pain in your abdomen or pelvis.  You have pain during sex.  You have vaginal bleeding between menstrual periods. Summary  Bacterial vaginosis is a vaginal infection that occurs when the normal balance of bacteria in the vagina changes. It results from an overgrowth of certain bacteria.  This condition increases the risk of sexually transmitted infections (STIs). Getting treated can help reduce this risk.  Treatment is very important for pregnant women because this condition can cause babies to be born early (prematurely) or at low birth weight.  This condition is treated with antibiotic medicines. These may be given as a pill, a vaginal cream, or a medicine that is put into the vagina (suppository). This information is not intended to replace advice given to you by your health care provider. Make sure you discuss any questions you have with your health care provider. Document Revised: 10/22/2019 Document Reviewed: 10/22/2019 Elsevier Patient Education  [place].

## 2020-08-12 NOTE — Progress Notes (Signed)
Pelvic pain-RLQ

## 2020-08-13 ENCOUNTER — Other Ambulatory Visit: Payer: Self-pay

## 2020-08-13 DIAGNOSIS — I872 Venous insufficiency (chronic) (peripheral): Secondary | ICD-10-CM

## 2020-08-15 ENCOUNTER — Ambulatory Visit (INDEPENDENT_AMBULATORY_CARE_PROVIDER_SITE_OTHER): Payer: No Typology Code available for payment source | Admitting: Adult Health

## 2020-08-15 ENCOUNTER — Other Ambulatory Visit: Payer: Self-pay

## 2020-08-15 ENCOUNTER — Other Ambulatory Visit (HOSPITAL_COMMUNITY): Payer: Self-pay

## 2020-08-15 ENCOUNTER — Encounter (INDEPENDENT_AMBULATORY_CARE_PROVIDER_SITE_OTHER): Payer: Self-pay | Admitting: Adult Health

## 2020-08-15 VITALS — BP 120/76 | HR 70 | Temp 98.3°F | Ht 68.0 in | Wt 238.0 lb

## 2020-08-15 DIAGNOSIS — E785 Hyperlipidemia, unspecified: Secondary | ICD-10-CM

## 2020-08-15 DIAGNOSIS — E559 Vitamin D deficiency, unspecified: Secondary | ICD-10-CM

## 2020-08-15 DIAGNOSIS — E118 Type 2 diabetes mellitus with unspecified complications: Secondary | ICD-10-CM

## 2020-08-15 DIAGNOSIS — Z9189 Other specified personal risk factors, not elsewhere classified: Secondary | ICD-10-CM

## 2020-08-15 DIAGNOSIS — E1169 Type 2 diabetes mellitus with other specified complication: Secondary | ICD-10-CM | POA: Diagnosis not present

## 2020-08-15 DIAGNOSIS — Z6836 Body mass index (BMI) 36.0-36.9, adult: Secondary | ICD-10-CM

## 2020-08-15 MED ORDER — METFORMIN HCL 500 MG PO TABS
ORAL_TABLET | Freq: Every morning | ORAL | 0 refills | Status: DC
  Filled 2020-08-15 – 2020-08-25 (×2): qty 90, 90d supply, fill #0

## 2020-08-15 NOTE — Progress Notes (Signed)
[facility] Summary Report / Loop Recorder 

## 2020-08-16 NOTE — Progress Notes (Signed)
Chief Complaint:   OBESITY [name] [name] is here to discuss her progress with her obesity treatment plan along with follow-up of her obesity related diagnoses. [name] [name] is on keeping a food journal and adhering to recommended goals of 1200 calories and 85 protein and states she is following her eating plan approximately 50% of the time. [name] [name] states she is walking and biking 30 minutes 3 times per week.  Today's visit was #: 13 Starting weight: 243 lbs Starting date: 06/18/2019 Today's weight: 238 lbs Today's date: 08/15/2020 Total lbs lost to date: 5 Total lbs lost since last in-office visit: 0  Interim History: This is [name] [name]'s first in office appointment since 06/14/2020.  She has been dealing with recurrent syncopal episodes- reviewed records in Epic with pt.  She has experienced an increase in stress which she feels has increased "stress eating". 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   Subjective:   1. Type 2 diabetes mellitus with other specified complication, without long-term current use of insulin ([name] [name]) [name] [name]'s 10/14/2019 A1c was 5.6- excellent/at goal. She is on Metformin 500 mg QD.   Lab Results  Component Value Date   HGBA1C 5.6 10/14/2019   HGBA1C 5.7 (H) 06/18/2019   HGBA1C 5.3 01/08/2019   Lab Results  Component Value Date   MICROALBUR 30 01/09/2017   LDLCALC 121 (H) 10/14/2019   CREATININE 0.99 07/28/2020   Lab Results  Component Value Date   INSULIN 11.9 10/14/2019   INSULIN 10.1 06/18/2019    2. Hyperlipidemia associated with type 2 diabetes mellitus ([name] [name]) [name] [name]'s 10/13/2020 lipid panel revealed LDL 121, well above goal of 70 for diabetic. She has never been on statin therapy. She has strong family history (maternal and paternal) of CAD and HLD.  Lab Results  Component Value Date   ALT 10 06/22/2020   AST 16 06/22/2020   ALKPHOS 54 06/22/2020   BILITOT 1.0 06/22/2020   Lab Results  Component Value Date   CHOL  181 10/14/2019   HDL 44 10/14/2019   LDLCALC 121 (H) 10/14/2019   TRIG 85 10/14/2019   CHOLHDL 3.7 01/08/2019    3. Vitamin D deficiency [name] [name]'s Vitamin D level was 43.7 on 10/14/2019. She is currently taking prescription vitamin D 50,000 IU each week. She denies nausea, vomiting or muscle weakness.  4. At risk for complication associated with hypotension [name] [name] is at risk for hypotension due bradycardia, syncopal episodes, and wearing loop recorder per cardiology.  Assessment/Plan:   1. Type 2 diabetes mellitus with other specified complication, without long-term current use of insulin ([name] [name]) Good blood sugar control is important to decrease the likelihood of diabetic complications such as nephropathy, neuropathy, limb loss, blindness, coronary artery disease, and death. Intensive lifestyle modification including diet, exercise and weight loss are the first line of treatment for diabetes. Check labs today.  - metFORMIN (GLUCOPHAGE) 500 MG tablet; TAKE 1 TABLET BY MOUTH EVERY MORNING.  Dispense: 90 tablet; Refill: 0  - Comprehensive metabolic panel - Hemoglobin A1c - Insulin, random  2. Hyperlipidemia associated with type 2 diabetes mellitus ([place]) Cardiovascular risk and specific lipid/LDL goals reviewed.  We discussed several lifestyle modifications today and [name] [name] will continue to work on diet, exercise and weight loss efforts. Orders and follow up as documented in patient record. Check labs today.   Counseling Intensive lifestyle modifications are the first line treatment for this issue. . Dietary changes: Increase soluble fiber. Decrease simple carbohydrates. . Exercise changes: Moderate to  vigorous-intensity aerobic activity 150 minutes per week if tolerated. . Lipid-lowering medications: see documented in medical record.  - Comprehensive metabolic panel - Lipid panel  3. Vitamin D deficiency Low Vitamin D level contributes to fatigue and are associated with obesity,  breast, and colon cancer. She agrees to continue to take prescription Vitamin D @50 ,000 IU every week and will follow-up for routine testing of Vitamin D, at least 2-3 times per year to avoid over-replacement. Check labs today.  - VITAMIN D 25 Hydroxy (Vit-D Deficiency, Fractures)  4. At risk for complication associated with hypotension [name] [name] was given approximately 15 minutes of education and counseling today to help avoid hypotension. We discussed risks of hypotension with weight loss and signs of hypotension such as feeling lightheaded or unsteady.  Repetitive spaced learning was employed today to elicit superior memory formation and behavioral change.  5. Class 2 severe obesity with serious comorbidity and body mass index (BMI) of 36.0 to 36.9 in adult, unspecified obesity type ([name] [name]) [name] [name] is currently in the action stage of change. As such, her goal is to continue with weight loss efforts. She has agreed to keeping a food journal and adhering to recommended goals of 1200 calories and 85 g protein.   Change to paper/pen to track intake.  Submit for fasting labs tomorrow morning- orders entered.  Exercise goals: As is  Behavioral modification strategies: increasing lean protein intake, decreasing simple carbohydrates, increasing water intake, no skipping meals, meal planning and cooking strategies, keeping healthy foods in the home, planning for success and keeping a strict food journal.  [name] [name] has agreed to follow-up with our clinic in 3-4 weeks. She was informed of the importance of frequent follow-up visits to maximize her success with intensive lifestyle modifications for her multiple health conditions.   [name] [name] was informed we would discuss her lab results at her next visit unless there is a critical issue that needs to be addressed sooner. [name] [name] agreed to keep her next visit at the agreed upon time to discuss these results.  Objective:   Blood pressure 120/76, pulse 70,  temperature 98.3 F (36.8 C), height 5\' 8"  (1.727 m), weight 238 lb (108 kg), SpO2 100 %. Body mass index is 36.19 kg/m.  General: Cooperative, alert, well developed, in no acute distress. HEENT: Conjunctivae and lids unremarkable. Cardiovascular: Regular rhythm.  Lungs: Normal work of breathing. Neurologic: No focal deficits.   Lab Results  Component Value Date   CREATININE 0.99 07/28/2020   BUN 14 07/28/2020   NA 139 07/28/2020   K 4.1 07/28/2020   CL 107 07/28/2020   CO2 24 07/28/2020   Lab Results  Component Value Date   ALT 10 06/22/2020   AST 16 06/22/2020   ALKPHOS 54 06/22/2020   BILITOT 1.0 06/22/2020   Lab Results  Component Value Date   HGBA1C 5.6 10/14/2019   HGBA1C 5.7 (H) 06/18/2019   HGBA1C 5.3 01/08/2019   HGBA1C 5.5 05/30/2017   HGBA1C 6.4 01/09/2017   Lab Results  Component Value Date   INSULIN 11.9 10/14/2019   INSULIN 10.1 06/18/2019   Lab Results  Component Value Date   TSH 2.553 06/22/2020   Lab Results  Component Value Date   CHOL 181 10/14/2019   HDL 44 10/14/2019   LDLCALC 121 (H) 10/14/2019   TRIG 85 10/14/2019   CHOLHDL 3.7 01/08/2019   Lab Results  Component Value Date   WBC 7.1 07/28/2020   HGB 14.5 07/28/2020   HCT 43.1 07/28/2020  MCV 87.8 07/28/2020   PLT 268 07/28/2020   Lab Results  Component Value Date   FERRITIN 9 (L) 03/14/2016    Attestation Statements:   Reviewed by clinician on day of visit: allergies, medications, problem list, medical history, surgical history, family history, social history, and previous encounter notes.  Coral Ceo, am acting as Location manager for [name], NP.  I have reviewed the above documentation for accuracy and completeness, and I agree with the above. -  Marykatherine Sherwood d. Tannor Pyon, NP-C

## 2020-08-19 LAB — LIPID PANEL
Chol/HDL Ratio: 4.2 ratio (ref 0.0–4.4)
Cholesterol, Total: 205 mg/dL — ABNORMAL HIGH (ref 100–199)
HDL: 49 mg/dL (ref >39)
LDL Chol Calc (NIH): 144 mg/dL — ABNORMAL HIGH (ref 0–99)
Triglycerides: 65 mg/dL (ref 0–149)
VLDL Cholesterol Cal: 12 mg/dL (ref 5–40)

## 2020-08-19 LAB — COMPREHENSIVE METABOLIC PANEL
ALT: 11 IU/L (ref 0–32)
AST: 15 IU/L (ref 0–40)
Albumin/Globulin Ratio: 1.6 (ref 1.2–2.2)
Albumin: 4.5 g/dL (ref 3.8–4.8)
Alkaline Phosphatase: 68 IU/L (ref 44–121)
BUN/Creatinine Ratio: 13 (ref 9–23)
BUN: 12 mg/dL (ref 6–20)
Bilirubin Total: 0.4 mg/dL (ref 0.0–1.2)
CO2: 23 mmol/L (ref 20–29)
Calcium: 9.2 mg/dL (ref 8.7–10.2)
Chloride: 102 mmol/L (ref 96–106)
Creatinine, Ser: 0.94 mg/dL (ref 0.57–1.00)
Globulin, Total: 2.9 g/dL (ref 1.5–4.5)
Glucose: 91 mg/dL (ref 65–99)
Potassium: 4.3 mmol/L (ref 3.5–5.2)
Sodium: 142 mmol/L (ref 134–144)
Total Protein: 7.4 g/dL (ref 6.0–8.5)
eGFR: 79 mL/min/{1.73_m2} (ref >59)

## 2020-08-19 LAB — HEMOGLOBIN A1C
Est. average glucose Bld gHb Est-mCnc: 120 mg/dL
Hgb A1c MFr Bld: 5.8 % — ABNORMAL HIGH (ref 4.8–5.6)

## 2020-08-19 LAB — VITAMIN D 25 HYDROXY (VIT D DEFICIENCY, FRACTURES): Vit D, 25-Hydroxy: 48.6 ng/mL (ref 30.0–100.0)

## 2020-08-19 LAB — INSULIN, RANDOM: INSULIN: 11 u[IU]/mL (ref 2.6–24.9)

## 2020-08-25 ENCOUNTER — Encounter: Payer: Self-pay | Admitting: Obstetrics and Gynecology

## 2020-08-25 ENCOUNTER — Other Ambulatory Visit: Payer: Self-pay | Admitting: Obstetrics and Gynecology

## 2020-08-25 ENCOUNTER — Other Ambulatory Visit (HOSPITAL_COMMUNITY): Payer: Self-pay

## 2020-08-25 ENCOUNTER — Other Ambulatory Visit: Payer: No Typology Code available for payment source

## 2020-08-25 NOTE — Telephone Encounter (Signed)
Patient was seen on 08/12/20 and treated for BV infection.

## 2020-08-26 ENCOUNTER — Other Ambulatory Visit: Payer: Self-pay | Admitting: Obstetrics and Gynecology

## 2020-08-26 ENCOUNTER — Other Ambulatory Visit (HOSPITAL_COMMUNITY): Payer: Self-pay

## 2020-08-26 ENCOUNTER — Other Ambulatory Visit: Payer: Self-pay

## 2020-08-26 ENCOUNTER — Ambulatory Visit (INDEPENDENT_AMBULATORY_CARE_PROVIDER_SITE_OTHER): Payer: No Typology Code available for payment source | Admitting: Physician Assistant

## 2020-08-26 ENCOUNTER — Ambulatory Visit (HOSPITAL_COMMUNITY)
Admission: RE | Admit: 2020-08-26 | Discharge: 2020-08-26 | Disposition: A | Discharge status: Home / Self Care | Payer: No Typology Code available for payment source | Source: Ambulatory Visit | Attending: Vascular Surgery | Admitting: Vascular Surgery

## 2020-08-26 VITALS — BP 111/74 | HR 80 | Temp 97.7°F | Resp 20 | Ht 68.0 in | Wt 241.7 lb

## 2020-08-26 DIAGNOSIS — R600 Localized edema: Secondary | ICD-10-CM | POA: Diagnosis not present

## 2020-08-26 DIAGNOSIS — I872 Venous insufficiency (chronic) (peripheral): Secondary | ICD-10-CM

## 2020-08-26 MED ORDER — FLUCONAZOLE 150 MG PO TABS
150.0000 mg | ORAL_TABLET | Freq: Once | ORAL | 0 refills | Status: AC
  Filled 2020-08-26: qty 1, 1d supply, fill #0

## 2020-08-26 NOTE — Progress Notes (Signed)
Office Note     CC:  follow up Requesting Provider:  Lorrene Reid, PA-C  HPI: [name] [name] is a 40 y.o. (1980-06-28) female who presents for evaluation of bilateral lower extremity edema and discomfort.  She underwent staged greater saphenous vein ablation procedures with stab phlebectomy in December 2019 and January 2020.  She continues to wear 20 to 30 mmHg thigh-high compression daily.  She elevates her legs when possible during the day.  She denies any DVT or venous ulcerations currently.  She is wondering if she has any clots or if anything further can be done to improve symptoms of edema and discomfort.  She denies tobacco use.   Past Medical History:  Diagnosis Date  . Abnormal uterine bleeding   . Amenorrhea   . Anemia   . Anxiety    panic attacks  . Back pain   . Bradycardia   . Chest pain   . Constipation   . COVID 02/2020  . Diabetes ([place])   . Dysmenorrhea   . Endometriosis   . Gallbladder problem   . GERD (gastroesophageal reflux disease)    occasional - diet controlled and tums prn  . HLD (hyperlipidemia)   . HTN (hypertension)   . IBS (irritable bowel syndrome)   . Infertility, female   . Insulin resistance   . Migraine without aura   . Missed abortion    x 2 - both resolved without surgery  . Obesity   . PCOS (polycystic ovarian syndrome)   . PCOS (polycystic ovarian syndrome)   . PONV (postoperative nausea and vomiting)    pe rpatient ; scopalamine patch very helpful   . Sleep apnea   . SVD (spontaneous vaginal delivery)    x 1  . Syncope and collapse 06/21/2020  . Urinary incontinence   . Vitamin D deficiency     Past Surgical History:  Procedure Laterality Date  . CHOLECYSTECTOMY    . COLONOSCOPY  2017   polyps  . CYSTOSCOPY N/A 10/29/2016   Procedure: CYSTOSCOPY;  Surgeon: Salvadore Dom, MD;  Location: [facility];  Service: Gynecology;  Laterality: N/A;  . DILATATION & CURRETTAGE/HYSTEROSCOPY WITH RESECTOCOPE N/A 10/17/2012    Procedure: DILATATION & CURETTAGE/HYSTEROSCOPY WITH RESECTOCOPE; Possible Polypectomy, Possible Resectoscopic Myomectomy.;  Surgeon: Floyce Stakes. Pamala Hurry, MD;  Location: [facility];  Service: Gynecology;  Laterality: N/A;  1 hr.  Marland Kitchen DILATION AND CURETTAGE OF UTERUS    . ENDOMETRIAL BIOPSY    . ENDOVENOUS ABLATION SAPHENOUS VEIN W/ LASER Right 05/08/2018   endovenous laser ablation right greater saphenous vein and stab phlebectomy > 20 incisions right leg by Deitra Mayo MD   . ESOPHAGOGASTRODUODENOSCOPY ENDOSCOPY  2017   Normal  . HYSTEROSCOPY WITH D & C N/A 12/01/2013   Procedure: IUD Removal;  Surgeon: Floyce Stakes. Pamala Hurry, MD;  Location: [facility];  Service: Gynecology;  Laterality: N/A;  90 min.  . INTRAUTERINE DEVICE (IUD) INSERTION N/A 10/17/2012   Procedure: INTRAUTERINE DEVICE (IUD) INSERTION; Mirena;  Surgeon: Floyce Stakes. Pamala Hurry, MD;  Location: [facility];  Service: Gynecology;  Laterality: N/A;  . LAPAROSCOPIC GASTRIC SLEEVE RESECTION N/A 02/19/2017   Procedure: LAPAROSCOPIC GASTRIC SLEEVE RESECTION, UPPER ENDOSCOPY;  Surgeon: Johnathan Hausen, MD;  Location: [facility];  Service: General;  Laterality: N/A;  . LAPAROSCOPIC SALPINGO OOPHERECTOMY Left 10/29/2016   Procedure: LAPAROSCOPIC SALPINGO OOPHORECTOMY With anterior uterine biopsy;  Surgeon: Salvadore Dom, MD;  Location: [facility];  Service: Gynecology;  Laterality: Left;  . LASIK  10/06/2018  .  LOOP RECORDER INSERTION N/A 06/22/2020   Procedure: LOOP RECORDER INSERTION;  Surgeon: Marinus Maw, MD;  Location: [facility];  Service: Cardiovascular;  Laterality: N/A;  . MOUTH SURGERY     wisdom teeth    Social History   Socioeconomic History  . Marital status: Married    Spouse name: Alianny Silversmith  . Number of children: 1  . Years of education: Not on file  . Highest education level: Not on file  Occupational History  . 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 Smoking status: Former  Smoker    Packs/day: 0.25    Years: 19.00    Pack years: 4.75    Types: Cigarettes    Quit date: 06/06/2016    Years since quitting: 4.2  . Smokeless tobacco: Never Used  Vaping Use  . Vaping Use: Never used  Substance and Sexual Activity  . Alcohol use: Yes    Comment: 1 a month  . Drug use: No  . Sexual activity: Yes    Partners: Male    Birth control/protection: I.U.D.  Other Topics Concern  . Not on file  Social History Narrative  . Not on file   Social Determinants of Health   Financial Resource Strain: Not on file  Food Insecurity: Not on file  Transportation Needs: Not on file  Physical Activity: Not on file  Stress: Not on file  Social Connections: Not on file  Intimate Partner Violence: Not on file    Family History  Problem Relation Age of Onset  . Diabetes Father   . Thyroid disease Father   . Hypertension Father   . Parkinson's disease Father   . Dementia Father   . Hyperlipidemia Father   . Heart disease Father   . Sleep apnea Father   . Obesity Father   . Diabetes Maternal Grandmother   . Heart disease Maternal Grandmother   . Diabetes Maternal Grandfather   . Heart disease Maternal Grandfather   . Stroke Maternal Grandfather   . Diabetes Paternal Grandmother   . Diabetes Paternal Grandfather   . Heart disease Paternal Grandfather   . Alcohol abuse Paternal Grandfather   . Hypertension Mother   . Diabetes Mother   . Depression Mother   . Obesity Mother   . Colon cancer Maternal Uncle     Current Outpatient Medications  Medication Sig Dispense Refill  . APPLE CIDER VINEGAR PO Take 1 tablet by mouth daily.    Marland Kitchen ascorbic acid (VITAMIN C) 500 MG tablet Take 500 mg by mouth daily.    . Biotin w/ Vitamins C & E (HAIR/SKIN/NAILS PO) Take 1 tablet by mouth daily.     . Chromium Picolinate 200 MCG CAPS Take 1 capsule by mouth daily.    . clonazePAM (KLONOPIN) 0.5 MG tablet Take 1 tablet (0.5 mg total) by mouth 2 (two) times daily as needed for  anxiety. 20 tablet 1  . Coenzyme Q10 (COQ10) 100 MG CAPS Take 1 capsule by mouth daily.    . cyclobenzaprine (FLEXERIL) 10 MG tablet Take 1 tablet (10 mg total) by mouth 3 (three) times daily as needed for muscle spasms. 30 tablet 0  . Docosahexaenoic Acid (DHA COMPLETE PO) Take 1 tablet by mouth daily.    . influenza vac split quadrivalent PF (FLUARIX) 0.5 ML injection TO BE ADMINISTERED BY PHARMACIST (Patient taking differently: TO BE ADMINISTERED BY PHARMACIST) .5 mL 0  . levonorgestrel (MIRENA)  20 MCG/24HR IUD 1 each by Intrauterine route once.    . loratadine (CLARITIN) 10 MG tablet Take 10 mg by mouth daily as needed for allergies.    . metFORMIN (GLUCOPHAGE) 500 MG tablet TAKE 1 TABLET BY MOUTH EVERY MORNING. 90 tablet 0  . Multiple Vitamins-Minerals (BARIATRIC MULTIVITAMINS/IRON PO) Take 1 tablet by mouth in the morning and at bedtime.    Marland Kitchen nystatin cream (MYCOSTATIN) Apply 1 application topically 2 (two) times daily. Apply to affected area BID for up to 7 days. (Patient taking differently: Apply 1 application topically 2 (two) times daily as needed for dry skin.) 30 g 1  . ondansetron (ZOFRAN) 8 MG tablet Take 1 tablet (8 mg total) by mouth every 8 (eight) hours as needed for nausea or vomiting. 20 tablet 0  . Probiotic Product (PROBIOTIC ADVANCED PO) Take 1 capsule by mouth daily.     Marland Kitchen Spirulina 500 MG TABS Take 1 tablet by mouth daily.    . SUMAtriptan (IMITREX) 50 MG tablet TAKE 1 TABLET BY MOUTH AT ONSET OF MIGRAINE, MAY REPEAT IN 2 HOURS IF HEADACHE PERSISTS 10 tablet 0  . Vitamin D, Ergocalciferol, (DRISDOL) 1.25 MG (50000 UNIT) CAPS capsule TAKE 1 CAPSULE BY MOUTH EVERY 7 DAYS (Patient taking differently: Take 50,000 Units by mouth every 7 (seven) days. Sundays) 12 capsule 0  . fluconazole (DIFLUCAN) 150 MG tablet Take 1 tablet (150 mg total) by mouth once for 1 dose. 1 tablet 0   No current facility-administered medications for this visit.    Allergies  Allergen Reactions  .  Iohexol Hives     Code: HIVES, Desc: *NEED 13 HR PREDISONE AND BENADRYL  BE FOR IV CONTRAST GIVEN* /DR OLSON PT DEVELOPED HIVES AND ROOF OF MOUTH BEGIN "ITCHING"", Onset Date: [id number]   . Pyridium [Phenazopyridine Hcl] Other (See Comments)    vaginitis symptoms when on it     REVIEW OF SYSTEMS:   [X]  denotes positive finding, [ ]  denotes negative finding Cardiac  Comments:  Chest pain or chest pressure:    Shortness of breath upon exertion:    Short of breath when lying flat:    Irregular heart rhythm:        Vascular    Pain in calf, thigh, or hip brought on by ambulation:    Pain in feet at night that wakes you up from your sleep:     Blood clot in your veins:    Leg swelling:         Pulmonary    Oxygen at home:    Productive cough:     Wheezing:         Neurologic    Sudden weakness in arms or legs:     Sudden numbness in arms or legs:     Sudden onset of difficulty speaking or slurred speech:    Temporary loss of vision in one eye:     Problems with dizziness:         Gastrointestinal    Blood in stool:     Vomited blood:         Genitourinary    Burning when urinating:     Blood in urine:        Psychiatric    Major depression:         Hematologic    Bleeding problems:    Problems with blood clotting too easily:        Skin    Rashes or ulcers:  Constitutional    Fever or chills:      PHYSICAL EXAMINATION:  Vitals:   08/26/20 0834  BP: 111/74  Pulse: 80  Resp: 20  Temp: 97.7 F (36.5 C)  TempSrc: Temporal  SpO2: 95%  Weight: 241 lb 11.2 oz (109.6 kg)  Height: 5\' 8"  (1.727 m)    General:  WDWN in NAD; vital signs documented above Gait: Not observed HENT: WNL, normocephalic Pulmonary: normal non-labored breathing Cardiac: regular HR Abdomen: soft, NT, no masses Skin: without rashes Vascular Exam/Pulses:  Right Left  Radial 2+ (normal) 2+ (normal)  DP 2+ (normal) 2+ (normal)   Extremities: without ischemic changes, without  Gangrene , without cellulitis; without open wounds; small area of spider veins left medial knee; varicose veins proximal right medial calf Musculoskeletal: no muscle wasting or atrophy  Neurologic: A&O X 3;  No focal weakness or paresthesias are detected Psychiatric:  The pt has Normal affect.   Non-Invasive Vascular Imaging:   Bilateral lower extremity venous reflux study demonstrates deep venous reflux in the femoral vein and common femoral vein bilaterally Both greater saphenous veins are not visualized indicating a successful ablation Left small saphenous insufficiency however diameter is less than 3-1/2 mm Negative for DVT bilaterally    ASSESSMENT/PLAN:: 40 y.o. female here for evaluation of bilateral lower extremity edema and discomfort with history of bilateral greater saphenous vein ablation and stab phlebectomies  -Bilateral lower extremity venous reflux study is consistent with prior greater saphenous vein ablation; patient does have some deep venous reflux as well as left lower extremity small saphenous vein insufficiency however small saphenous vein is small in diameter and there is no indication for ablation therapy -Unfortunately there is nothing further to offer other than continued regular use of compression stockings, periodic elevation of the legs, and avoiding prolonged sitting and standing -Patient will call/return office with any problems or concerns   Dagoberto Ligas, PA-C [facility] [phone]  Clinic MD:   Stanford Breed on call

## 2020-08-29 ENCOUNTER — Other Ambulatory Visit (INDEPENDENT_AMBULATORY_CARE_PROVIDER_SITE_OTHER): Payer: Self-pay | Admitting: Family Medicine

## 2020-08-29 DIAGNOSIS — R300 Dysuria: Secondary | ICD-10-CM

## 2020-08-30 ENCOUNTER — Other Ambulatory Visit (HOSPITAL_COMMUNITY): Payer: Self-pay

## 2020-08-30 ENCOUNTER — Other Ambulatory Visit (INDEPENDENT_AMBULATORY_CARE_PROVIDER_SITE_OTHER): Payer: Self-pay

## 2020-08-30 MED ORDER — FLUCONAZOLE 150 MG PO TABS
ORAL_TABLET | ORAL | 0 refills | Status: DC
  Filled 2020-08-30: qty 2, 3d supply, fill #0

## 2020-08-30 MED ORDER — CEPHALEXIN 500 MG PO CAPS
500.0000 mg | ORAL_CAPSULE | Freq: Three times a day (TID) | ORAL | 0 refills | Status: DC
  Filled 2020-08-30: qty 15, 5d supply, fill #0

## 2020-09-01 ENCOUNTER — Encounter (HOSPITAL_COMMUNITY): Payer: Self-pay | Admitting: Emergency Medicine

## 2020-09-01 ENCOUNTER — Emergency Department (HOSPITAL_COMMUNITY): Payer: No Typology Code available for payment source

## 2020-09-01 ENCOUNTER — Emergency Department (HOSPITAL_COMMUNITY)
Admission: EM | Admit: 2020-09-01 | Discharge: 2020-09-01 | Disposition: A | Discharge status: Home / Self Care | Payer: No Typology Code available for payment source | Attending: Emergency Medicine | Admitting: Emergency Medicine

## 2020-09-01 ENCOUNTER — Telehealth: Payer: Self-pay | Admitting: Student

## 2020-09-01 ENCOUNTER — Other Ambulatory Visit: Payer: Self-pay

## 2020-09-01 ENCOUNTER — Telehealth: Payer: Self-pay

## 2020-09-01 DIAGNOSIS — R519 Headache, unspecified: Secondary | ICD-10-CM | POA: Insufficient documentation

## 2020-09-01 DIAGNOSIS — R42 Dizziness and giddiness: Secondary | ICD-10-CM | POA: Diagnosis not present

## 2020-09-01 DIAGNOSIS — R55 Syncope and collapse: Secondary | ICD-10-CM | POA: Diagnosis present

## 2020-09-01 DIAGNOSIS — Z8616 Personal history of COVID-19: Secondary | ICD-10-CM | POA: Diagnosis not present

## 2020-09-01 DIAGNOSIS — I10 Essential (primary) hypertension: Secondary | ICD-10-CM | POA: Insufficient documentation

## 2020-09-01 DIAGNOSIS — E119 Type 2 diabetes mellitus without complications: Secondary | ICD-10-CM | POA: Diagnosis not present

## 2020-09-01 DIAGNOSIS — Z87891 Personal history of nicotine dependence: Secondary | ICD-10-CM | POA: Diagnosis not present

## 2020-09-01 DIAGNOSIS — Z7984 Long term (current) use of oral hypoglycemic drugs: Secondary | ICD-10-CM | POA: Diagnosis not present

## 2020-09-01 LAB — BASIC METABOLIC PANEL
Anion gap: 9 (ref 5–15)
BUN: 15 mg/dL (ref 6–20)
CO2: 25 mmol/L (ref 22–32)
Calcium: 9.3 mg/dL (ref 8.9–10.3)
Chloride: 103 mmol/L (ref 98–111)
Creatinine, Ser: 0.89 mg/dL (ref 0.44–1.00)
GFR, Estimated: >60 mL/min (ref >60)
Glucose, Bld: 111 mg/dL — ABNORMAL HIGH (ref 70–99)
Potassium: 4 mmol/L (ref 3.5–5.1)
Sodium: 137 mmol/L (ref 135–145)

## 2020-09-01 LAB — CBC
HCT: 45.3 % (ref 36.0–46.0)
Hemoglobin: 15.4 g/dL — ABNORMAL HIGH (ref 12.0–15.0)
MCH: 29.5 pg (ref 26.0–34.0)
MCHC: 34 g/dL (ref 30.0–36.0)
MCV: 86.8 fL (ref 80.0–100.0)
Platelets: 322 10*3/uL (ref 150–400)
RBC: 5.22 MIL/uL — ABNORMAL HIGH (ref 3.87–5.11)
RDW: 12.1 % (ref 11.5–15.5)
WBC: 9.4 10*3/uL (ref 4.0–10.5)
nRBC: 0 % (ref 0.0–0.2)

## 2020-09-01 LAB — I-STAT BETA HCG BLOOD, ED (MC, WL, AP ONLY): I-stat hCG, quantitative: <5 m[IU]/mL (ref <5)

## 2020-09-01 LAB — TROPONIN I (HIGH SENSITIVITY)
Troponin I (High Sensitivity): <2 ng/L (ref <18)
Troponin I (High Sensitivity): <2 ng/L

## 2020-09-01 MED ORDER — SODIUM CHLORIDE 0.9 % IV BOLUS
1000.0000 mL | Freq: Once | INTRAVENOUS | Status: AC
  Administered 2020-09-01: 1000 mL via INTRAVENOUS

## 2020-09-01 MED ORDER — KETOROLAC TROMETHAMINE 15 MG/ML IJ SOLN
15.0000 mg | Freq: Once | INTRAMUSCULAR | Status: AC
  Administered 2020-09-01: 15 mg via INTRAVENOUS
  Filled 2020-09-01: qty 1

## 2020-09-01 MED ORDER — DIPHENHYDRAMINE HCL 50 MG/ML IJ SOLN
12.5000 mg | Freq: Once | INTRAMUSCULAR | Status: AC
  Administered 2020-09-01: 12.5 mg via INTRAVENOUS
  Filled 2020-09-01: qty 1

## 2020-09-01 MED ORDER — PROCHLORPERAZINE EDISYLATE 10 MG/2ML IJ SOLN
10.0000 mg | Freq: Once | INTRAMUSCULAR | Status: AC
  Administered 2020-09-01: 10 mg via INTRAVENOUS
  Filled 2020-09-01: qty 2

## 2020-09-01 NOTE — Consult Note (Addendum)
Cardiology Consultation:   [name] MRN: [id number]; DOB: 13-Jan-1981  Admit date: 09/01/2020 Date of Consult: 09/01/2020  PCP:  [name] [name], [place]  Cardiologist:  [name] Dresser, MD  Electrophysiologist:  None    Patient Profile:   [name] [name] is a 40 y.o. female with a hx of PCOS, obesity s/p bariatric surgery, former smoker who is being seen today for the evaluation of recurrent dizziness, extreme fatigue and weaklness at the request of Dr. Vanita [name].  History of Present Illness:   [name] had EP consult back in feb after a syncopal event noting hx of chronic dizziness and recurrent near syncope/syncope. There were reports of self observed HRs as low as 29-30's  She noted review of Reviewed 30 day monitor from 04/2018 and more recent monitor from 11/2019. No significant pauses or arrhythmias. Symptoms did not correlate to abnormal heart rhythms. Normal heart rate variability, augments appropriately with activity. Orthostatics done 10/2019 with appropriate elevation in HR with standing without drop in blood pressure She was doing well until COVID infection October Subsequently having recurrent symptoms, at her visit 06/10/20 mentionined HR 20's-80's, always with prdrome and discussed loop implant She reported at that time  All her events happen with a similar set of symptoms She has been standing or seated, never occurred supine She [name] [name] start to feel lightheaded, then very weak, dizzy, en often near syncopale and usually able to lay down if getting very weak before she has fainted until today   She works in a medical office and in January via automatic cuff SDP 80's and her HR varied 72, 28, 60, 57, 29 via pulse ox After a few minutes she says her BP recovered to 125/86 but HR by pulse ox stayed in the 20's? Today at work Unable to get vitals until she was better, but says that when they tried to sit her up in the  chair she became near syncopal again and and then again a 3rd time, and by the time they could get her vitals BP was ok and again by pulse ox HR reportedly 20's She was seen by Dr. Rayann Heman who suspected some degree of sinus node dysfunction likely related to vagal tone/prior bariatruc surgery and given very young age urged avoidance of pacing. underwent ILR implant the following day with Dr. Lovena Le 06/23/20 Noting that when in the hospital had symptoms, not associated with HR/rhythm and more orthostatic sounding  She sent some symptom episodes in march, I spoke with her and reviewed transmission Slowest I noted was 50's She has had a number of symptom episodes for dizziness, lightheaded, palpitations. HR and rhythm have been OK, no arrhythmias, no marked bradycardia or pauses She has had a couple ST episodes, 2 associated with activity (walking and laundry) One false pause episode with low amplitude and artifact.   The patient called today with dizziness and near syncope that started yesterday, was marked and inhibited her from being able to do her usual morning activities, onset was as soon as she got up and persisted all morning into the afternoon This morning again much the same ILR transmission was sent in and in review by our device RN given some HR fluctuation and her significant symptoms advised to go to the ER.  LABS K+ 4.0 BUN/Creat 15/0.89 HS Trop <2 WBC 9.4 H/H 15/45 Plts 322 preg x2 are <5.0 (neg)  CT head negative CXR NAD  She reports that in general  does and feels OK though has times like the last couple days that she is just profoundly tired, persistently lightheaded and dizzy. To the point of being unable to do simple tasks feeling to poorly to drive Even now in the ER feels exhausted with an constant undertone of feeling dizzy. HRs are 50's-60's, BP 106/60 and she is supine  In review of her loop transmissions and available EGMs SB-SR Largely rates 50's some dipping  to 48 range, from today, no pausing no swings in her HR HR histogram looks very good, no rates <46majority 50's-70's  Yesterday's transmission was reviewed by Dr. Curt Bears SR at about 100bpm with a failrly though not completely abrupt reduction to the 50's  She has not had heart block or pausing No arrhythmias    Past Medical History:  Diagnosis Date   Abnormal uterine bleeding    Amenorrhea    Anemia    Anxiety    panic attacks   Back pain    Bradycardia    Chest pain    Constipation    COVID 02/2020   Diabetes ([place])    Dysmenorrhea    Endometriosis    Gallbladder problem    GERD (gastroesophageal reflux disease)    occasional - diet controlled and tums prn   HLD (hyperlipidemia)    HTN (hypertension)    IBS (irritable bowel syndrome)    Infertility, female    Insulin resistance    Migraine without aura    Missed abortion    x 2 - both resolved without surgery   Obesity    PCOS (polycystic ovarian syndrome)    PCOS (polycystic ovarian syndrome)    PONV (postoperative nausea and vomiting)    pe rpatient ; scopalamine patch very helpful    Sleep apnea    SVD (spontaneous vaginal delivery)    x 1   Syncope and collapse 06/21/2020   Urinary incontinence    Vitamin D deficiency     Past Surgical History:  Procedure Laterality Date   CHOLECYSTECTOMY     COLONOSCOPY  2017   polyps   CYSTOSCOPY N/A 10/29/2016   Procedure: CYSTOSCOPY;  Surgeon: Salvadore Dom, MD;  Location: [facility];  Service: Gynecology;  Laterality: N/A;   DILATATION & CURRETTAGE/HYSTEROSCOPY WITH RESECTOCOPE N/A 10/17/2012   Procedure: DILATATION & CURETTAGE/HYSTEROSCOPY WITH RESECTOCOPE; Possible Polypectomy, Possible Resectoscopic Myomectomy.;  Surgeon: Floyce Stakes. Pamala Hurry, MD;  Location: [facility];  Service: Gynecology;  Laterality: N/A;  1 hr.   DILATION AND CURETTAGE OF UTERUS     ENDOMETRIAL BIOPSY     ENDOVENOUS ABLATION SAPHENOUS VEIN W/ LASER Right 05/08/2018   endovenous laser ablation  right greater saphenous vein and stab phlebectomy > 20 incisions right leg by Deitra Mayo MD    ESOPHAGOGASTRODUODENOSCOPY ENDOSCOPY  2017   Normal   HYSTEROSCOPY WITH D & C N/A 12/01/2013   Procedure: IUD Removal;  Surgeon: Floyce Stakes. Pamala Hurry, MD;  Location: [facility];  Service: Gynecology;  Laterality: N/A;  90 min.   INTRAUTERINE DEVICE (IUD) INSERTION N/A 10/17/2012   Procedure: INTRAUTERINE DEVICE (IUD) INSERTION; Mirena;  Surgeon: Floyce Stakes. Pamala Hurry, MD;  Location: [facility];  Service: Gynecology;  Laterality: N/A;   LAPAROSCOPIC GASTRIC SLEEVE RESECTION N/A 02/19/2017   Procedure: LAPAROSCOPIC GASTRIC SLEEVE RESECTION, UPPER ENDOSCOPY;  Surgeon: Johnathan Hausen, MD;  Location: [facility];  Service: General;  Laterality: N/A;   LAPAROSCOPIC SALPINGO OOPHERECTOMY Left 10/29/2016   Procedure: LAPAROSCOPIC SALPINGO OOPHORECTOMY With anterior uterine biopsy;  Surgeon: Salvadore Dom,  MD;  Location: [facility];  Service: Gynecology;  Laterality: Left;   LASIK  10/06/2018   LOOP RECORDER INSERTION N/A 06/22/2020   Procedure: LOOP RECORDER INSERTION;  Surgeon: Evans Lance, MD;  Location: [facility];  Service: Cardiovascular;  Laterality: N/A;   MOUTH SURGERY     wisdom teeth     Home Medications:  Prior to Admission medications   Medication Sig Start Date End Date Taking? Authorizing Provider  APPLE CIDER VINEGAR PO Take 1 tablet by mouth daily.    [provider]  ascorbic acid (VITAMIN C) 500 MG tablet Take 500 mg by mouth daily.    [provider]  Biotin w/ Vitamins C & E (HAIR/SKIN/NAILS PO) Take 1 tablet by mouth daily.     [provider]  cephALEXin (KEFLEX) 500 MG capsule Take 1 capsule (500 mg total) by mouth 3 (three) times daily. 08/30/20   Briscoe Deutscher, DO  Chromium Picolinate 200 MCG CAPS Take 1 capsule by mouth daily.    [provider]  clonazePAM (KLONOPIN) 0.5 MG tablet Take 1 tablet (0.5 mg total) by mouth 2 (two) times daily as  needed for anxiety. 01/28/20   Briscoe Deutscher, DO  Coenzyme Q10 (COQ10) 100 MG CAPS Take 1 capsule by mouth daily.    [provider]  cyclobenzaprine (FLEXERIL) 10 MG tablet Take 1 tablet (10 mg total) by mouth 3 (three) times daily as needed for muscle spasms. 05/18/19   Danford, Valetta Fuller D, NP  Docosahexaenoic Acid (DHA COMPLETE PO) Take 1 tablet by mouth daily.    [provider]  fluconazole (DIFLUCAN) 150 MG tablet Take 1 tablet by mouth today, then repeat in 3 days 08/30/20   Briscoe Deutscher, DO  influenza vac split quadrivalent PF (FLUARIX) 0.5 ML injection TO BE ADMINISTERED BY PHARMACIST Patient taking differently: TO BE ADMINISTERED BY PHARMACIST 03/16/20 03/16/21  Carlyle Basques, MD  levonorgestrel (MIRENA) 20 MCG/24HR IUD 1 each by Intrauterine route once.    [provider]  loratadine (CLARITIN) 10 MG tablet Take 10 mg by mouth daily as needed for allergies.    [provider]  metFORMIN (GLUCOPHAGE) 500 MG tablet TAKE 1 TABLET BY MOUTH EVERY MORNING. 08/15/20 08/15/21  Danford, Valetta Fuller D, NP  Multiple Vitamins-Minerals (BARIATRIC MULTIVITAMINS/IRON PO) Take 1 tablet by mouth in the morning and at bedtime.    [provider]  nystatin cream (MYCOSTATIN) Apply 1 application topically 2 (two) times daily. Apply to affected area BID for up to 7 days. Patient taking differently: Apply 1 application topically 2 (two) times daily as needed for dry skin. 06/04/19   Salvadore Dom, MD  ondansetron (ZOFRAN) 8 MG tablet Take 1 tablet (8 mg total) by mouth every 8 (eight) hours as needed for nausea or vomiting. 07/09/18   Danford, Valetta Fuller D, NP  Probiotic Product (PROBIOTIC ADVANCED PO) Take 1 capsule by mouth daily.     [provider]  Spirulina 500 MG TABS Take 1 tablet by mouth daily.    [provider]  SUMAtriptan (IMITREX) 50 MG tablet TAKE 1 TABLET BY MOUTH AT ONSET OF MIGRAINE, MAY REPEAT IN 2 HOURS IF HEADACHE PERSISTS 03/02/20 03/02/21   Abonza, Maritza, PA-C  Vitamin D, Ergocalciferol, (DRISDOL) 1.25 MG (50000 UNIT) CAPS capsule TAKE 1 CAPSULE BY MOUTH EVERY 7 DAYS Patient taking differently: Take 50,000 Units by mouth every 7 (seven) days. Sundays 06/14/20 06/14/21  Briscoe Deutscher, DO    Inpatient Medications: Scheduled Meds:  Continuous  Infusions:  PRN Meds:   Allergies:    Allergies  Allergen Reactions   Iohexol Hives     Code: HIVES, Desc: *NEED 13 HR PREDISONE AND BENADRYL  BE FOR IV CONTRAST GIVEN* /DR OLSON PT DEVELOPED HIVES AND ROOF OF MOUTH BEGIN "ITCHING"", Onset Date: 34193790    Pyridium [Phenazopyridine Hcl] Other (See Comments)    vaginitis symptoms when on it    Social History:   Social History   Socioeconomic History   Marital status: Married    Spouse name: Waverly Tarquinio   Number of children: 1   Years of education: Not on file   Highest education level: Not on file  Occupational History   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  Other Topics Concern   Not on file  Social History Narrative   Not on file   Social Determinants of Health   Financial Resource Strain: Not on file  Food Insecurity: Not on file  Transportation Needs: Not on file  Physical Activity: Not on file  Stress: Not on file  Social Connections: Not on file  Intimate Partner Violence: Not on file    Family History:   Family History  Problem Relation Age of Onset   Diabetes Father    Thyroid disease Father    Hypertension Father    Parkinson's disease Father    Dementia  Father    Hyperlipidemia Father    Heart disease Father    Sleep apnea Father    Obesity Father    Diabetes Maternal Grandmother    Heart disease Maternal Grandmother    Diabetes Maternal Grandfather    Heart disease Maternal Grandfather    Stroke Maternal Grandfather    Diabetes Paternal Grandmother    Diabetes Paternal Grandfather    Heart disease Paternal Grandfather    Alcohol abuse Paternal Grandfather    Hypertension Mother    Diabetes Mother    Depression Mother    Obesity Mother    Colon cancer Maternal Uncle      ROS:  Please see the history of present illness.  All other ROS reviewed and negative.     Physical Exam/Data:   Vitals:   09/01/20 1004 09/01/20 1010 09/01/20 1100 09/01/20 1220  BP: (!) 122/93  98/69 101/67  Pulse: 89  66 (!) 54  Resp: 16  16 13   Temp: 98.9 F (37.2 C)     TempSrc: Oral     SpO2: 98%  98% 100%  Weight:  104.8 kg    Height:  5\' 9"  (1.753 m)     No intake or output data in the 24 hours ending 09/01/20 1253 Last 3 Weights 09/01/2020 08/26/2020 08/15/2020  Weight (lbs) 231 lb 241 lb 11.2 oz 238 lb  Weight (kg) 104.781 kg 109.634 kg 107.956 kg     Body mass index is 34.11 kg/m.  General:  Well nourished, well developed, in no  acute distress HEENT: normal Lymph: no adenopathy Neck: no JVD Endocrine:  No thryomegaly Vascular: No carotid bruits  Cardiac:  RRR; no murmurs, gallops or rubs Lungs:  CTA b/l, no wheezing, rhonchi or rales  Abd: soft, nontender, no hepatomegaly  Ext: no edema Musculoskeletal:  No deformities Skin: warm and dry  Neuro:  no focal abnormalities noted Psych:  Normal affect   EKG:  The EKG was personally reviewed and demonstrates:    SR 92bpm, normal intervals  Telemetry:  Telemetry was personally reviewed and demonstrates:   SB/SR 50's-60's, some 48-50 range  Relevant CV Studies:   Echo 06/22/2020  1. Left ventricular ejection fraction, by estimation, is 60 to 65%. The  left ventricle has normal  function. The left ventricle has no regional  wall motion abnormalities. There is mild left ventricular hypertrophy.  Left ventricular diastolic parameters  were normal.   2. Right ventricular systolic function is normal. The right ventricular  size is normal. Tricuspid regurgitation signal is inadequate for assessing  PA pressure.   3. The mitral valve is normal in structure. No evidence of mitral valve  regurgitation.   4. The aortic valve was not well visualized. Aortic valve regurgitation  is not visualized. No aortic stenosis is present.   5. The inferior vena cava is normal in size with greater than 50%  respiratory variability, suggesting right atrial pressure of 3 mmHg.    July 2021 12 days of data recorded on Zio monitor. Patient had a min HR of 37 bpm, max HR of 180 bpm, and avg HR of 67 bpm. Predominant underlying rhythm was Sinus Rhythm. No VT, SVT, atrial fibrillation, high degree block, or pauses noted. Isolated atrial and ventricular ectopy was rare (<1%). There were 91 triggered events. These were all sinus, with occasional isolate PAC or PVC. No significant arrhythmias detected.      03/23/2018 IMPRESSION: 1. Coronary calcium score of 46. This was 56 percentile for age and sex matched control.   2. Normal coronary origin with right dominance.   3. 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 Aggressive medical management is recommended.   4. Mildly dilated pulmonary artery measuring 32 mm suggestive of pulmonary hypertension.     03/23/2018 Study Conclusions  - Left ventricle: The cavity size was normal. Systolic function was    normal. The estimated ejection fraction was in the range of 55%    to 60%. Wall motion was normal; there were no regional wall    motion abnormalities. Left ventricular diastolic function    parameters were normal.  - Left atrium: The atrium was mildly dilated.  - Atrial septum: No defect or patent foramen ovale was  identified.   Laboratory Data:  High Sensitivity Troponin:   Recent Labs  Lab 09/01/20 1012  TROPONINIHS <2     Chemistry Recent Labs  Lab 09/01/20 1012  NA 137  K 4.0  CL 103  CO2 25  GLUCOSE 111*  BUN 15  CREATININE 0.89  CALCIUM 9.3  GFRNONAA >60  ANIONGAP 9    No results for input(s): PROT, ALBUMIN, AST, ALT, ALKPHOS, BILITOT in the last 168 hours. Hematology Recent Labs  Lab 09/01/20 1012  WBC 9.4  RBC 5.22*  HGB 15.4*  HCT 45.3  MCV 86.8  MCH 29.5  MCHC 34.0  RDW 12.1  PLT 322   BNPNo results for input(s): BNP, PROBNP in the last 168 hours.  DDimer No results for input(s): DDIMER in the last 168  hours.   Radiology/Studies:  DG Chest 2 View Result Date: 09/01/2020 CLINICAL DATA:  Near syncopal episode. EXAM: CHEST - 2 VIEW COMPARISON:  07/28/2020 FINDINGS: The cardiac silhouette, mediastinal and hilar contours are within normal limits. The cardiac monitor device is noted on left side. The lungs are clear. No pleural effusions. No pulmonary lesions. The bony thorax is intact. IMPRESSION: No acute cardiopulmonary findings. Electronically Signed   By: Marijo Sanes M.D.   On: 09/01/2020 10:56   CT Head Wo Contrast Result Date: 09/01/2020 CLINICAL DATA:  Severe headache. EXAM: CT HEAD WITHOUT CONTRAST TECHNIQUE: Contiguous axial images were obtained from the base of the skull through the vertex without intravenous contrast. COMPARISON:  10/16/2014. FINDINGS: Brain: No evidence of acute infarction, hemorrhage, hydrocephalus, extra-axial collection or mass lesion/mass effect. Vascular: No hyperdense vessel or unexpected calcification. Skull: Normal. Negative for fracture or focal lesion. Sinuses/Orbits: Negative. Other: None. IMPRESSION: Negative head CT. Electronically Signed   By: Inge Rise M.D.   On: 09/01/2020 11:55     Assessment and Plan:   1. Recurrent dizziness, near syncope     Hours of profound fatigue and constant undertone of  dizziness  Again, hard to think HRs 50's are causing her symptoms, extremely reluctant to pace a 41 year old She is having symptoms here with HR 50's-60's as we are talking  Dr. Curt Bears has seen and examined the patient, early follow up with Dr. Lovena Le has been arranged No pacing at this time OK to discharge from our perspective from the ER   Risk Assessment/Risk Scores:   For questions or updates, please contact [facility] Please consult [email or web address] for contact info under    Signed, Baldwin Jamaica, PA-C  09/01/2020 12:53 PM  I have seen and examined this patient with Tommye Standard.  Agree with above, note added to reflect my findings.  On exam, RRR, no murmurs, lungs clear.  Patient presented to the emergency room after an episode of near syncope.  In review of her Linq monitor, she has had a few episodes of bradycardia as well as tachycardia into the low 100s.  In the past, she has been symptomatic when she switches from tachycardia into the 100s down to bradycardia in the 40s to 50s.  Despite that, laying in bed flat with heart rates in the mid 50s, she continues to feel dizzy with similar symptoms.  At this point, I do not feel that pacemaker implant would be beneficial without further evidence of significant bradycardia associated with her symptoms.  Okay for discharge from the emergency room at this time.  We Sohrab Keelan plan for follow-up in EP clinic.  Arti Trang M. Pepper Kerrick MD 09/01/2020 3:40 PM

## 2020-09-01 NOTE — Discharge Instructions (Signed)
Your work-up today was reassuring.  Please go to your appointment with Dr. Lovena Le on 09/09/2020, as scheduled.  Would also like for you to follow-up with your primary care provider regarding your recurrent episodes of dizziness.  Perhaps they can order a tilt table test or do other diagnostic studies to evaluate your recurrent dizziness.  Return to the ER or seek immediate medical attention should you experience any new or worsening symptoms.

## 2020-09-01 NOTE — ED Notes (Signed)
Cardiology Dr. Curt Bears has been at bedside.

## 2020-09-01 NOTE — Telephone Encounter (Signed)
Patient reports yesterday morning at 0400 am (got up to get ready for work). States she felt dizzy and felt like she was going to pass out. States she felt this way right after waking up. Reports she felt bad all morning. From 11-1 pm she felt better. Symptoms started back after 1pm while she was working on her computer at home. Reports during these times she is moving around getting ready although during these times she is not able to finish getting ready d/t feeling like she will pass out. Reports today she is experiencing the same symptoms such as dizziness and lightheaded. States this morning she felt like she was seeing a black tunnel, she lifted her feet above her head and the feeling resolve. Reports she still is dizzy w/ a headache at this time. Patient is not currently on any medication that controls heart rate.   Patient has ILR 2, so unable to send manual send. Having difficulty loading full report in [facility] at this time. Patient does have 2 symptom events logged on 4/27 & 4/28 in early morning hours around 0530 am. 4/27 event appears to start with heart rate around 110 bpm then decreased very quickly to 40-50 bpm. 4/28 event appears brady on 40-50 bpm, towards end of EGM rate increases to 70's then back down to 50's.   Patient expresses that this is affecting her daily living, social living and work life. During these times she is very fatigued and feels like she needs to sleep but has to work. She is very concerned and expresses she wants to know what can be done to help her feel better. I advised the patient that her symptoms do correlate with heart rate variation and considering her symptoms I recommend she go to the ED [facility]) for evaluation. Advised patient I will contact EP Doc on call at the hospital to let him know I am sending her. Patient advised not to drive. Patient verbalized understanding and agreeable to plan.

## 2020-09-01 NOTE — Telephone Encounter (Signed)
   Patient called Answering Service with concerns for low hear rate. Called and spoke with patient. 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 She has intermittent episodes of lightheadedness/dizziness. She will sometimes go months in between these episodes. She did have a loop recorder placed in 06/2020 to help monitor this. She started feeling lightheaded/dizzy  yesterday and noted her heart rate was low. She did not go to work because of it yesterday. Her heart rate was 45 earlier this morning. While on the phone with me it was 51 bpm. BP 106/60 this morning. She notes continued mild lightheadedness/dizziness and is nervous about getting up much. No syncope though. No chest pain or shortness of breath. Recommended increasing salt intake and fluid intake today as well as wearing compression stockings to see if maybe increasing her BP a little will help with her symptoms. However, if symptoms persist or worsen, discussed that she may need to come to the ED. She has symptomatic bradycardia and it sounds like pacemaker has been discussed in the past. However, we can try the above measures first which patient is comfortable with.  Will also send message to device clinic to make sure that she has not had any concerning pauses or high grade block that would expedite need for pacemaker.  Darreld Mclean, PA-C 09/01/2020 8:17 AM

## 2020-09-01 NOTE — ED Provider Notes (Signed)
[facility] EMERGENCY DEPARTMENT Provider Note   CSN: [id number] Arrival date & time: 09/01/20  [id number]     History Chief Complaint  Patient presents with  . Near Syncope    [name] [name] is a 40 y.o. female with past medical history significant for HTN, HLD, coronary atherosclerosis, and heart rate variability with associated near syncope followed by [facility] who presents the ED with complaints of near syncope.  I reviewed patient's medical record and she was admitted on 06/21/2020 for near syncope in context of bradycardia and [employer] loop recorder was placed.  Most recent echocardiogram obtained 06/22/2020 showed preserved EF and without valvular abnormalities.  She called into cardiology this morning at 8 AM regarding episodes of near syncope and feeling lightheaded and dizzy.  She states that her heart rate was in the 40s and she is symptomatic.  The device was interrogated and there was heart rate variation, but no obvious or particularly concerning pauses that might otherwise necessitate pacemaker placement.  Dr. Curt Bears was advised that she would be coming to the ED for evaluation.  On my examination, patient states that she actually woke up yesterday at 4 AM feeling as though she might pass out.  She states that these episodes will occur intermittently over the past few months.  She states that she developed central chest pressure, shortness of breath, and mild headache.  She increased her salt and water intake yesterday and laid in bed with her feet elevated in effort to curb her symptoms.  She also notes that she wore her stockings all day long.  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  This morning she woke up and despite initially feeling okay, quickly developed worsening symptoms.  She states that at approximately 8 AM she developed a severe headache, worse than any recent headaches that she can remember.  While she often  will have headaches associated with her symptoms, she states this one felt worse.  She states that her husband checked her blood pressure and heart rate.  Her blood pressure was normal, but her heart rate was in the 40s.  She denies thunderclap onset, blurred vision, numbness or weakness, room spinning dizziness, or other focal deficits.  She also denies any cough, congestion, recent fevers or chills, possibility of pregnancy, abdominal pain, nausea or vomiting, history of clots or clotting disorder, recent unilateral extremity swelling or edema, or other symptoms.   HPI     Past Medical History:  Diagnosis Date  . Abnormal uterine bleeding   . Amenorrhea   . Anemia   . Anxiety    panic attacks  . Back pain   . Bradycardia   . Chest pain   . Constipation   . COVID 02/2020  . Diabetes ([place])   . Dysmenorrhea   . Endometriosis   . Gallbladder problem   . GERD (gastroesophageal reflux disease)    occasional - diet controlled and tums prn  . HLD (hyperlipidemia)   . HTN (hypertension)   . IBS (irritable bowel syndrome)   . Infertility, female   . Insulin resistance   . Migraine without aura   . Missed abortion    x 2 - both resolved without surgery  . Obesity   . PCOS (polycystic ovarian syndrome)   . PCOS (polycystic ovarian syndrome)   . PONV (postoperative nausea and vomiting)    pe rpatient ; scopalamine patch very helpful   . Sleep  apnea   . SVD (spontaneous vaginal delivery)    x 1  . Syncope and collapse 06/21/2020  . Urinary incontinence   . Vitamin D deficiency     Patient Active Problem List   Diagnosis Date Noted  . Syncope and collapse 06/21/2020  . Syncope 06/21/2020  . Near syncope 11/04/2019  . Hyperlipidemia associated with type 2 diabetes mellitus ([place]) 11/04/2019  . Prediabetes 10/15/2019  . Vitamin D deficiency 08/06/2019  . Constipation 08/06/2019  . Endometriosis   . Candidiasis, cutaneous 07/14/2018  . Flu-like symptoms 07/09/2018  .  Pharyngitis 07/09/2018  . GAD (generalized anxiety disorder) 06/02/2018  . Dysuria 03/27/2018  . Family history of parkinsonism 03/27/2018  . Dizziness 03/27/2018  . Bradycardia 03/23/2018  . HLD (hyperlipidemia) 03/23/2018  . Coronary atherosclerosis 03/23/2018  . Chest pain 03/22/2018  . Varicose vein of leg 12/24/2017  . Venous reflux 12/24/2017  . Leukocytosis 09/29/2017  . Nausea 09/29/2017  . Anxiety 09/29/2017  . Pancreatitis 09/28/2017  . Dehydration 03/12/2017  . 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 Sleep apnea 01/09/2017  . Healthcare maintenance 10/18/2016  . Type 2 diabetes mellitus without complication, without long-term current use of insulin ([name]) 10/18/2016  . LOW BACK PAIN, ACUTE 11/20/2006  . POLYCYSTIC OVARY 07/04/2006  . OBESITY, NOS 07/04/2006  . TOBACCO USE, QUIT 07/04/2006    Past Surgical History:  Procedure Laterality Date  . CHOLECYSTECTOMY    . COLONOSCOPY  2017   polyps  . CYSTOSCOPY N/A 10/29/2016   Procedure: CYSTOSCOPY;  Surgeon: Salvadore Dom, MD;  Location: [facility];  Service: Gynecology;  Laterality: N/A;  . DILATATION & CURRETTAGE/HYSTEROSCOPY WITH RESECTOCOPE N/A 10/17/2012   Procedure: DILATATION & CURETTAGE/HYSTEROSCOPY WITH RESECTOCOPE; Possible Polypectomy, Possible Resectoscopic Myomectomy.;  Surgeon: Floyce Stakes. Pamala Hurry, MD;  Location: [facility];  Service: Gynecology;  Laterality: N/A;  1 hr.  Marland Kitchen DILATION AND CURETTAGE OF UTERUS    . ENDOMETRIAL BIOPSY    . ENDOVENOUS ABLATION SAPHENOUS VEIN W/ LASER Right 05/08/2018   endovenous laser ablation right greater saphenous vein and stab phlebectomy > 20 incisions right leg by Deitra Mayo MD   . ESOPHAGOGASTRODUODENOSCOPY ENDOSCOPY  2017   Normal  . HYSTEROSCOPY WITH D & C N/A 12/01/2013   Procedure: IUD Removal;  Surgeon: Floyce Stakes. Pamala Hurry, MD;  Location: [facility];  Service: Gynecology;  Laterality: N/A;  90 min.  . INTRAUTERINE DEVICE (IUD) INSERTION N/A  10/17/2012   Procedure: INTRAUTERINE DEVICE (IUD) INSERTION; Mirena;  Surgeon: Floyce Stakes. Pamala Hurry, MD;  Location: [facility];  Service: Gynecology;  Laterality: N/A;  . LAPAROSCOPIC GASTRIC SLEEVE RESECTION N/A 02/19/2017   Procedure: LAPAROSCOPIC GASTRIC SLEEVE RESECTION, UPPER ENDOSCOPY;  Surgeon: Johnathan Hausen, MD;  Location: [facility];  Service: General;  Laterality: N/A;  . LAPAROSCOPIC SALPINGO OOPHERECTOMY Left 10/29/2016   Procedure: LAPAROSCOPIC SALPINGO OOPHORECTOMY With anterior uterine biopsy;  Surgeon: Salvadore Dom, MD;  Location: [facility];  Service: Gynecology;  Laterality: Left;  . LASIK  10/06/2018  . LOOP RECORDER INSERTION N/A 06/22/2020   Procedure: LOOP RECORDER INSERTION;  Surgeon: Evans Lance, MD;  Location: [facility];  Service: Cardiovascular;  Laterality: N/A;  . MOUTH SURGERY     wisdom teeth     OB History    Gravida  3   Para  1   Term  1   Preterm      AB  2   Living  1     SAB  2  IAB      Ectopic      Multiple      Live Births  1           Family History  Problem Relation Age of Onset  . Diabetes Father   . Thyroid disease Father   . Hypertension Father   . Parkinson's disease Father   . Dementia Father   . Hyperlipidemia Father   . Heart disease Father   . Sleep apnea Father   . Obesity Father   . Diabetes Maternal Grandmother   . Heart disease Maternal Grandmother   . Diabetes Maternal Grandfather   . Heart disease Maternal Grandfather   . Stroke Maternal Grandfather   . Diabetes Paternal Grandmother   . Diabetes Paternal Grandfather   . Heart disease Paternal Grandfather   . Alcohol abuse Paternal Grandfather   . Hypertension Mother   . Diabetes Mother   . Depression Mother   . Obesity Mother   . Colon cancer Maternal Uncle     Social History   Tobacco Use  . Smoking status: Former Smoker    Packs/day: 0.25    Years: 19.00    Pack years: 4.75    Types: Cigarettes    Quit date: 06/06/2016    Years  since quitting: 4.2  . Smokeless tobacco: Never Used  Vaping Use  . Vaping Use: Never used  Substance Use Topics  . Alcohol use: Yes    Comment: 1 a month  . Drug use: No    Home Medications Prior to Admission medications   Medication Sig Start Date End Date Taking? Authorizing Provider  APPLE CIDER VINEGAR PO Take 1 tablet by mouth daily.    [provider]  ascorbic acid (VITAMIN C) 500 MG tablet Take 500 mg by mouth daily.    [provider]  Biotin w/ Vitamins C & E (HAIR/SKIN/NAILS PO) Take 1 tablet by mouth daily.     [provider]  cephALEXin (KEFLEX) 500 MG capsule Take 1 capsule (500 mg total) by mouth 3 (three) times daily. 08/30/20   Briscoe Deutscher, DO  Chromium Picolinate 200 MCG CAPS Take 1 capsule by mouth daily.    [provider]  clonazePAM (KLONOPIN) 0.5 MG tablet Take 1 tablet (0.5 mg total) by mouth 2 (two) times daily as needed for anxiety. 01/28/20   Briscoe Deutscher, DO  Coenzyme Q10 (COQ10) 100 MG CAPS Take 1 capsule by mouth daily.    [provider]  cyclobenzaprine (FLEXERIL) 10 MG tablet Take 1 tablet (10 mg total) by mouth 3 (three) times daily as needed for muscle spasms. 05/18/19   Danford, Valetta Fuller D, NP  Docosahexaenoic Acid (DHA COMPLETE PO) Take 1 tablet by mouth daily.    [provider]  fluconazole (DIFLUCAN) 150 MG tablet Take 1 tablet by mouth today, then repeat in 3 days 08/30/20   Briscoe Deutscher, DO  influenza vac split quadrivalent PF (FLUARIX) 0.5 ML injection TO BE ADMINISTERED BY PHARMACIST Patient taking differently: TO BE ADMINISTERED BY PHARMACIST 03/16/20 03/16/21  Carlyle Basques, MD  levonorgestrel (MIRENA) 20 MCG/24HR IUD 1 each by Intrauterine route once.    [provider]  loratadine (CLARITIN) 10 MG tablet Take 10 mg by mouth daily as needed for allergies.    [provider]  metFORMIN (GLUCOPHAGE) 500 MG tablet TAKE 1 TABLET BY MOUTH EVERY MORNING. 08/15/20 08/15/21   Danford, Valetta Fuller D, NP  Multiple Vitamins-Minerals (BARIATRIC MULTIVITAMINS/IRON PO) Take 1 tablet by mouth  in the morning and at bedtime.    [provider]  nystatin cream (MYCOSTATIN) Apply 1 application topically 2 (two) times daily. Apply to affected area BID for up to 7 days. Patient taking differently: Apply 1 application topically 2 (two) times daily as needed for dry skin. 06/04/19   Salvadore Dom, MD  ondansetron (ZOFRAN) 8 MG tablet Take 1 tablet (8 mg total) by mouth every 8 (eight) hours as needed for nausea or vomiting. 07/09/18   Danford, Valetta Fuller D, NP  Probiotic Product (PROBIOTIC ADVANCED PO) Take 1 capsule by mouth daily.     [provider]  Spirulina 500 MG TABS Take 1 tablet by mouth daily.    [provider]  SUMAtriptan (IMITREX) 50 MG tablet TAKE 1 TABLET BY MOUTH AT ONSET OF MIGRAINE, MAY REPEAT IN 2 HOURS IF HEADACHE PERSISTS 03/02/20 03/02/21  Abonza, Maritza, PA-C  Vitamin D, Ergocalciferol, (DRISDOL) 1.25 MG (50000 UNIT) CAPS capsule TAKE 1 CAPSULE BY MOUTH EVERY 7 DAYS Patient taking differently: Take 50,000 Units by mouth every 7 (seven) days. Sundays 06/14/20 06/14/21  Briscoe Deutscher, DO    Allergies    Iohexol and Pyridium [phenazopyridine hcl]  Review of Systems   Review of Systems  All other systems reviewed and are negative.   Physical Exam Updated Vital Signs BP (!) 107/56   Pulse (!) 48   Temp 98.9 F (37.2 C) (Oral)   Resp 18   Ht 5\' 9"  (1.753 m)   Wt 104.8 kg   SpO2 97%   BMI 34.11 kg/m   Physical Exam Vitals and nursing note reviewed. Exam conducted with a chaperone present.  Constitutional:      General: She is not in acute distress.    Appearance: Normal appearance.  HENT:     Head: Normocephalic.  Eyes:     General: No scleral icterus.    Conjunctiva/sclera: Conjunctivae normal.  Cardiovascular:     Rate and Rhythm: Normal rate and regular rhythm.     Pulses: Normal pulses.     Heart sounds: Normal heart  sounds.  Pulmonary:     Effort: Pulmonary effort is normal. No respiratory distress.     Breath sounds: Normal breath sounds. No wheezing or rales.     Comments: CTA bilaterally.  No increased work of breathing. Abdominal:     General: Abdomen is flat. There is no distension.     Palpations: Abdomen is soft.     Tenderness: There is no abdominal tenderness.  Musculoskeletal:        General: Normal range of motion.     Right lower leg: No edema.     Left lower leg: No edema.  Skin:    General: Skin is dry.  Neurological:     General: No focal deficit present.     Mental Status: She is alert and oriented to person, place, and time.     GCS: GCS eye subscore is 4. GCS verbal subscore is 5. GCS motor subscore is 6.  Psychiatric:        Mood and Affect: Mood normal.        Behavior: Behavior normal.        Thought Content: Thought content normal.     ED Results / Procedures / Treatments   Labs (all labs ordered are listed, but only abnormal results are displayed) Labs Reviewed  BASIC METABOLIC PANEL - Abnormal; Notable for the following components:      Result Value   Glucose,  Bld 111 (*)    All other components within normal limits  CBC - Abnormal; Notable for the following components:   RBC 5.22 (*)    Hemoglobin 15.4 (*)    All other components within normal limits  I-STAT BETA HCG BLOOD, ED ([facility], [facility], [facility] ONLY)  TROPONIN I (HIGH SENSITIVITY)  TROPONIN I (HIGH SENSITIVITY)    EKG EKG Interpretation  Date/Time:  Thursday September 01 2020 10:03:12 EDT Ventricular Rate:  92 PR Interval:  146 QRS Duration: 88 QT Interval:  350 QTC Calculation: 432 R Axis:   81 Text Interpretation: Normal sinus rhythm Normal ECG Confirmed by Carmin Muskrat [phone]) on 09/01/2020 12:15:13 PM   Radiology DG Chest 2 View  Result Date: 09/01/2020 CLINICAL DATA:  Near syncopal episode. EXAM: CHEST - 2 VIEW COMPARISON:  07/28/2020 FINDINGS: The cardiac silhouette, mediastinal and hilar contours  are within normal limits. The cardiac monitor device is noted on left side. The lungs are clear. No pleural effusions. No pulmonary lesions. The bony thorax is intact. IMPRESSION: No acute cardiopulmonary findings. Electronically Signed   By: Marijo Sanes M.D.   On: 09/01/2020 10:56   CT Head Wo Contrast  Result Date: 09/01/2020 CLINICAL DATA:  Severe headache. EXAM: CT HEAD WITHOUT CONTRAST TECHNIQUE: Contiguous axial images were obtained from the base of the skull through the vertex without intravenous contrast. COMPARISON:  10/16/2014. FINDINGS: Brain: No evidence of acute infarction, hemorrhage, hydrocephalus, extra-axial collection or mass lesion/mass effect. Vascular: No hyperdense vessel or unexpected calcification. Skull: Normal. Negative for fracture or focal lesion. Sinuses/Orbits: Negative. Other: None. IMPRESSION: Negative head CT. Electronically Signed   By: Inge Rise M.D.   On: 09/01/2020 11:55    Procedures Procedures   Medications Ordered in ED Medications  ketorolac (TORADOL) 15 MG/ML injection 15 mg (15 mg Intravenous Given 09/01/20 1232)  prochlorperazine (COMPAZINE) injection 10 mg (10 mg Intravenous Given 09/01/20 1231)  diphenhydrAMINE (BENADRYL) injection 12.5 mg (12.5 mg Intravenous Given 09/01/20 1231)  sodium chloride 0.9 % bolus 1,000 mL (0 mLs Intravenous Stopped 09/01/20 1315)    ED Course  I have reviewed the triage vital signs and the nursing notes.  Pertinent labs & imaging results that were available during my care of the patient were reviewed by me and considered in my medical decision making (see chart for details).  Clinical Course as of 09/01/20 1422  Thu Sep 01, 2020  1210 I spoke with [facility] cardiology who will consult with EP about seeing patient. [GG]    Clinical Course User Index [GG] Corena Herter, PA-C   MDM Rules/Calculators/A&P                          Mikylah Townson Wedin was evaluated in Emergency Department on 09/01/2020 for the  symptoms described in the history of present illness. She was evaluated in the context of the global COVID-19 pandemic, which necessitated consideration that the patient might be at risk for infection with the SARS-CoV-2 virus that causes COVID-19. Institutional protocols and algorithms that pertain to the evaluation of patients at risk for COVID-19 are in a state of rapid change based on information released by regulatory bodies including the CDC and federal and state organizations. These policies and algorithms were followed during the patient's care in the ED.  I personally reviewed patient's medical chart and all notes from triage and staff during today's encounter. I have also ordered and reviewed all labs and imaging that I felt  to be medically necessary in the evaluation of this patient's complaints and with consideration of their physical exam. If needed, translation services were available and utilized.   Patient here in the ED for near syncope.  Likely cardiac in etiology.  She has a loop recorder placed and states that when she spoke with cardiology this morning they noted that she had a pause and heart rate variability.  Dr. Curt Bears has already been made aware and we will be consulting cardiology.  She is also endorsing a severe headache with onset approximately 2 hours PTA.  While she endorses headache symptoms when she has these presyncopal episodes, she notes that this feels different and is worse.  She also states that she has anxiety and suspects that her chest pain might be related to that.  Her chest discomfort is not severe, but she is concerned because her episodes of near syncope are significantly impacting her quality life.  We will proceed with syncope work-up including cardiac work-up/monitoring, basic labs, chest x-ray, and head CT.  I personally reviewed CT head obtained without contrast which was without any acute intracranial pathology.  Plain films are also personally reviewed  and without any acute cardiopulmonary disease.  CBC and BMP are unremarkable.  Initial troponin less than 2.  Repeat is stable.  I-STAT beta-hCG negative for pregnancy.  EKG shows normal sinus rhythm, normal EKG.  We will treat patient's headache with combination of Benadryl, Toradol, and Compazine.  Cardiology has been consulted given that her heart rate variability is likely the cause of her near-syncope.  I spoke with [facility] cardiology who will consult with EP about seeing patient.  On subsequent evaluation, heart rate was noted to be 46 and she states that she was feeling "sleepy".  She states that her headache has entirely resolved.  Patient was evaluated by Dr. Curt Bears at bedside.  He reviewed patient's chart and work-up and feels as though she is stable from his perspective.  He will arrange for close outpatient follow-up, but does not believe that ICD or other cardiac intervention would help her with her symptoms at this time.  Perhaps she would benefit from tilt table test or other further work-up on outpatient basis as needed.  I discussed this with the patient and she will call them to schedule appointment as well.  ED return precautions discussed.  Patient voices understanding and is agreeable to the plan.  Final Clinical Impression(s) / ED Diagnoses Final diagnoses:  Near syncope    Rx / DC Orders ED Discharge Orders    None       Reita Chard 09/01/20 1422    Carmin Muskrat, MD 09/09/20 1651

## 2020-09-01 NOTE — Telephone Encounter (Signed)
Spoke to Dr. Curt Bears in the hospital and advised of patient and she is coming to the ED.

## 2020-09-01 NOTE — ED Notes (Signed)
Patient transported to CT 

## 2020-09-01 NOTE — Telephone Encounter (Signed)
-----   Message from Alben Spittle sent at 09/01/2020  8:27 AM EDT ----- Regarding: FW: Loop Interrogation Good morning!   See message below from [facility]. Does she need an appt?  Please advise.   Thank you!  [place]  ----- Message ----- From: Darreld Mclean, PA-C Sent: 09/01/2020   8:21 AM EDT To: Alben Spittle Subject: Loop Interrogation                             Margo Aye,   Random question for you. I was the APP on call this morning and this patient called. She has history of some bradycardia with near syncope and has a loop recorder. She has been having some lightheadedness/dizziness since yesterday and noted that her heart rates were in the 40's to 50's this morning. I wanted reach out to the device clinic to see if they could interrogate her device to make sure she has not had any concerning pauses or high grade block. Do you know who the best person to reach out to for this is?  Thanks so much! Callie

## 2020-09-01 NOTE — ED Triage Notes (Signed)
Pt states she had a loop recorder placed in February due to feeling like she was going to pass out. Pt has been having frequent near syncope spells today. The cards office called her and told her to come into ED. Her HR fluctuates between 110 and then drops down to 40's. Pt also complains of a headache. Denies chest pain. But states she is having a hard time catching her breath, but is breathing easily at this moment.

## 2020-09-02 LAB — MICROSCOPIC EXAMINATION
Casts: NONE SEEN /lpf
Epithelial Cells (non renal): >10 /hpf — AB (ref 0–10)
RBC, Urine: NONE SEEN /hpf (ref 0–2)

## 2020-09-02 LAB — URINALYSIS, ROUTINE W REFLEX MICROSCOPIC
Bilirubin, UA: NEGATIVE
Glucose, UA: NEGATIVE
Ketones, UA: NEGATIVE
Leukocytes,UA: NEGATIVE
Nitrite, UA: NEGATIVE
Protein,UA: NEGATIVE
Specific Gravity, UA: 1.027 (ref 1.005–1.030)
Urobilinogen, Ur: 0.2 mg/dL (ref 0.2–1.0)
pH, UA: 5.5 (ref 5.0–7.5)

## 2020-09-02 LAB — URINE CULTURE

## 2020-09-05 ENCOUNTER — Ambulatory Visit (INDEPENDENT_AMBULATORY_CARE_PROVIDER_SITE_OTHER): Payer: No Typology Code available for payment source

## 2020-09-05 DIAGNOSIS — R55 Syncope and collapse: Secondary | ICD-10-CM | POA: Diagnosis not present

## 2020-09-05 LAB — CUP PACEART REMOTE DEVICE CHECK
Date Time Interrogation Session: 20220428155712
Implantable Pulse Generator Implant Date: 20220216

## 2020-09-07 ENCOUNTER — Other Ambulatory Visit (HOSPITAL_COMMUNITY): Payer: Self-pay

## 2020-09-07 ENCOUNTER — Ambulatory Visit (INDEPENDENT_AMBULATORY_CARE_PROVIDER_SITE_OTHER): Payer: No Typology Code available for payment source | Admitting: Family Medicine

## 2020-09-07 ENCOUNTER — Encounter (INDEPENDENT_AMBULATORY_CARE_PROVIDER_SITE_OTHER): Payer: Self-pay | Admitting: Family Medicine

## 2020-09-07 ENCOUNTER — Other Ambulatory Visit: Payer: Self-pay

## 2020-09-07 VITALS — BP 115/77 | HR 65 | Temp 98.3°F | Ht 68.0 in | Wt 241.0 lb

## 2020-09-07 DIAGNOSIS — E282 Polycystic ovarian syndrome: Secondary | ICD-10-CM | POA: Diagnosis not present

## 2020-09-07 DIAGNOSIS — E559 Vitamin D deficiency, unspecified: Secondary | ICD-10-CM | POA: Diagnosis not present

## 2020-09-07 DIAGNOSIS — E1169 Type 2 diabetes mellitus with other specified complication: Secondary | ICD-10-CM | POA: Diagnosis not present

## 2020-09-07 DIAGNOSIS — Z9189 Other specified personal risk factors, not elsewhere classified: Secondary | ICD-10-CM | POA: Diagnosis not present

## 2020-09-07 DIAGNOSIS — Z6837 Body mass index (BMI) 37.0-37.9, adult: Secondary | ICD-10-CM

## 2020-09-07 DIAGNOSIS — Z9884 Bariatric surgery status: Secondary | ICD-10-CM

## 2020-09-07 DIAGNOSIS — R0602 Shortness of breath: Secondary | ICD-10-CM

## 2020-09-07 MED ORDER — VITAMIN D (ERGOCALCIFEROL) 1.25 MG (50000 UNIT) PO CAPS
ORAL_CAPSULE | ORAL | 0 refills | Status: DC
  Filled 2020-09-07 – 2020-09-26 (×2): qty 12, 84d supply, fill #0

## 2020-09-08 ENCOUNTER — Encounter (HOSPITAL_COMMUNITY): Payer: Self-pay | Admitting: *Deleted

## 2020-09-09 ENCOUNTER — Telehealth: Payer: Self-pay | Admitting: Internal Medicine

## 2020-09-09 ENCOUNTER — Encounter: Payer: No Typology Code available for payment source | Admitting: Internal Medicine

## 2020-09-09 NOTE — Telephone Encounter (Signed)
Patient returning call.

## 2020-09-09 NOTE — Telephone Encounter (Signed)
  No new observations since 09/01/20.

## 2020-09-09 NOTE — Telephone Encounter (Addendum)
Returned call to pt. Pt reports that she tested positive for Covid today and worried b/c when she had it last year her HRs would drop. She currently feels improved, states she has good and bad days. Aware that d/t suspicion of possible POTS dx, she will need an "in person" visit.  And further rationale given as to why she should not do a virtual. Pt aware that POTS has not been diagnosed, only suspected.  Asked her to research this over the weekend. Advised to increase her salt intake to 3,000-10,000 mg/day.  Advised to increase her fluid intake to 2/3 L per day.  Pt confirms she is using the compression hose. Advised her against strenuous activities and change positions slowly. Aware that I will forward to scheduler to get her rescheduled w/ Dr. Lovena Le in the next week/two. Aware that if she is having Covid symptoms close to appt to call office to discuss w/ RN if appt still appropriate. Patient verbalized understanding and agreeable to plan.

## 2020-09-09 NOTE — Telephone Encounter (Signed)
Pt c/o BP issue: STAT if pt c/o blurred vision, one-sided weakness or slurred speech  1. What are your last 5 BP readings? 97/60 HR low 40  2. Are you having any other symptoms (ex. Dizziness, headache, blurred vision, passed out)? Dizziness headache sweating   3. What is your BP issue? BP low patient had a apt this morning but tested positive for covid as well. Patient asked if she could have a vv. Please advise.

## 2020-09-09 NOTE — Telephone Encounter (Addendum)
Left message to call back  (nothing showing on ILR since 4/28.  Spoke to [employer] who said pt should increase her salt intake, questionable POTS.  Pt will need to be r/s w/ Lovena Le for evaluation, canNOT do a virtual visit will need to be in person)

## 2020-09-13 NOTE — Progress Notes (Signed)
Chief Complaint:   OBESITY [name] [name] is here to discuss her progress with her obesity treatment plan along with follow-up of her obesity related diagnoses.   Today's visit was #: 14 Starting weight: 243 lbs Starting date: 06/18/2019 Today's weight: 241 lbs Today's date: 09/07/2020 Total lbs lost to date: 2 lbs Body mass index is 36.64 kg/m.  Total weight loss percentage to date: -0.82%  Interim History:  [name] [name] is wearing a loop recorder.  She has an appointment to review it this week.  She is considering a pacemaker.  She is getting her protein in.  Current Meal Plan: the Category 1 Plan for 80% of the time.  Current Exercise Plan: Walking for 30 minutes 2 times per week.  Assessment/Plan:   1. Vitamin D deficiency Improving, but not optimized. Current vitamin D is 48.6, tested on 08/18/2020. Optimal goal > 50 ng/dL.  She is taking vitamin D 50,000 IU weekly.   Plan: Continue to take prescription Vitamin D @50 ,000 IU every week as prescribed.  Follow-up for routine testing of Vitamin D, at least 2-3 times per year to avoid over-replacement.  - Refill Vitamin D, Ergocalciferol, (DRISDOL) 1.25 MG (50000 UNIT) CAPS capsule; TAKE 1 CAPSULE BY MOUTH EVERY 7 DAYS  Dispense: 12 capsule; Refill: 0  2. PCOS (polycystic ovarian syndrome) Controlled. She will continue to focus on protein-rich, low simple carbohydrate foods. We reviewed the importance of hydration, regular exercise for stress reduction, and restorative sleep. Medication: metformin 500 mg daily.  Counseling . PCOS is a leading cause of menstrual irregularities and infertility. It is also associated with obesity, hirsutism (excessive hair growth on the face, chest, or back), and cardiovascular risk factors such as high cholesterol and insulin resistance. . Insulin resistance appears to play a central role.  . Women with PCOS have been shown to have impaired appetite-regulating hormones. . Metformin is one medication that  can improve metabolic parameters.  . Women with polycystic ovary syndrome (PCOS) have an increased risk for cardiovascular disease (CVD) - European Journal of Preventive Cardiology.  3. Type 2 diabetes mellitus with other specified complication, without long-term current use of insulin (HCC) Diabetes Mellitus: Not at goal. Medication: metformin 500 mg daily. Issues reviewed: blood sugar goals, complications of diabetes mellitus, hypoglycemia prevention and treatment, exercise, and nutrition.   Plan: The patient will continue to focus on protein-rich, low simple carbohydrate foods. We reviewed the importance of hydration, regular exercise for stress reduction, and restorative sleep.   Lab Results  Component Value Date   HGBA1C 5.8 (H) 08/18/2020   HGBA1C 5.6 10/14/2019   HGBA1C 5.7 (H) 06/18/2019   Lab Results  Component Value Date   MICROALBUR 30 01/09/2017   LDLCALC 144 (H) 08/18/2020   CREATININE 0.89 09/01/2020   4. SOB (shortness of breath) on exertion [name] [name] has been feeling more short of breath recently. Plan:  IC today.  5. History of bariatric surgery [name] [name] is at risk for malnutrition due to her previous bariatric surgery.   Counseling  You may need to eat 3 meals and 2 snacks, or 5 small meals each [name] [name] in order to reach your protein and calorie goals.   Allow at least 15 minutes for each meal so that you can eat mindfully. Listen to your body so that you do not overeat. For most people, your sleeve or pouch will comfortably hold 4-6 ounces.  Eat foods from all food groups. This includes fruits and vegetables, grains, dairy, and meat and other proteins.  Include a protein-rich food at every meal and snack, and eat the protein food first.   You should be taking a Bariatric Multivitamin as well as calcium.   6. At risk for heart disease Due to [name] [name]'s current state of health and medical condition(s), she is at a higher risk for heart disease.  This puts the patient  at much greater risk to subsequently develop cardiopulmonary conditions that can significantly affect patient's quality of life in a negative manner.    At least 8 minutes were spent on counseling [name] [name] about these concerns today. Evidence-based interventions for health behavior change were utilized today including the discussion of self monitoring techniques, problem-solving barriers, and SMART goal setting techniques.  Specifically, regarding patient's less desirable eating habits and patterns, we employed the technique of small changes when [name] [name] has not been able to fully commit to her prudent nutritional plan.  7. Obesity, current BMI 36.7  Course: [name] [name] is currently in the action stage of change. As such, her goal is to continue with weight loss efforts.   Nutrition goals: She has agreed to keeping a food journal and adhering to recommended goals of 1500 calories and 80 grams of protein.   Exercise goals: As is.  Behavioral modification strategies: increasing lean protein intake and increasing water intake.  [name] [name] has agreed to follow-up with our clinic in 2-3 weeks. She was informed of the importance of frequent follow-up visits to maximize her success with intensive lifestyle modifications for her multiple health conditions.   Objective:   Blood pressure 115/77, pulse 65, temperature 98.3 F (36.8 C), temperature source Oral, height 5\' 8"  (1.727 m), weight 241 lb (109.3 kg), SpO2 94 %. Body mass index is 36.64 kg/m.  General: Cooperative, alert, well developed, in no acute distress. HEENT: Conjunctivae and lids unremarkable. Cardiovascular: Regular rhythm.  Lungs: Normal work of breathing. Neurologic: No focal deficits.   Lab Results  Component Value Date   CREATININE 0.89 09/01/2020   BUN 15 09/01/2020   NA 137 09/01/2020   K 4.0 09/01/2020   CL 103 09/01/2020   CO2 25 09/01/2020   Lab Results  Component Value Date   ALT 11 08/18/2020   AST 15 08/18/2020    ALKPHOS 68 08/18/2020   BILITOT 0.4 08/18/2020   Lab Results  Component Value Date   HGBA1C 5.8 (H) 08/18/2020   HGBA1C 5.6 10/14/2019   HGBA1C 5.7 (H) 06/18/2019   HGBA1C 5.3 01/08/2019   HGBA1C 5.5 05/30/2017   Lab Results  Component Value Date   INSULIN 11.0 08/18/2020   INSULIN 11.9 10/14/2019   INSULIN 10.1 06/18/2019   Lab Results  Component Value Date   TSH 2.553 06/22/2020   Lab Results  Component Value Date   CHOL 205 (H) 08/18/2020   HDL 49 08/18/2020   LDLCALC 144 (H) 08/18/2020   TRIG 65 08/18/2020   CHOLHDL 4.2 08/18/2020   Lab Results  Component Value Date   WBC 9.4 09/01/2020   HGB 15.4 (H) 09/01/2020   HCT 45.3 09/01/2020   MCV 86.8 09/01/2020   PLT 322 09/01/2020   Lab Results  Component Value Date   FERRITIN 9 (L) 03/14/2016   Attestation Statements:   Reviewed by clinician on [name] [name] of visit: allergies, medications, problem list, medical history, surgical history, family history, social history, and previous encounter notes.  I, Water quality scientist, CMA, am acting as transcriptionist for Briscoe Deutscher, DO  I have reviewed the above documentation for accuracy and completeness, and I agree  with the above. Briscoe Deutscher, DO

## 2020-09-15 ENCOUNTER — Other Ambulatory Visit: Payer: Self-pay | Admitting: Internal Medicine

## 2020-09-15 ENCOUNTER — Other Ambulatory Visit (HOSPITAL_COMMUNITY): Payer: Self-pay

## 2020-09-22 ENCOUNTER — Ambulatory Visit (INDEPENDENT_AMBULATORY_CARE_PROVIDER_SITE_OTHER): Payer: No Typology Code available for payment source | Admitting: Internal Medicine

## 2020-09-22 ENCOUNTER — Encounter: Payer: Self-pay | Admitting: Internal Medicine

## 2020-09-22 ENCOUNTER — Other Ambulatory Visit (HOSPITAL_COMMUNITY): Payer: Self-pay

## 2020-09-22 ENCOUNTER — Other Ambulatory Visit: Payer: Self-pay

## 2020-09-22 VITALS — BP 96/68 | HR 73 | Ht 68.0 in | Wt 243.6 lb

## 2020-09-22 DIAGNOSIS — R55 Syncope and collapse: Secondary | ICD-10-CM | POA: Diagnosis not present

## 2020-09-22 DIAGNOSIS — Z79899 Other long term (current) drug therapy: Secondary | ICD-10-CM

## 2020-09-22 MED ORDER — FLUDROCORTISONE ACETATE 0.1 MG PO TABS
0.1000 mg | ORAL_TABLET | Freq: Two times a day (BID) | ORAL | 11 refills | Status: DC
  Filled 2020-09-22: qty 60, 30d supply, fill #0

## 2020-09-22 NOTE — Progress Notes (Signed)
HPI Ms. Goodridge returns today for followup. She is a pleasant 40 yo woman with unexplained syncope, obesity, s/p ILR insertion. She had worn a 30 d monitor. She then had an ILR inserted. She has continued to have problems with orthostasis. She notes 1-2 episodes a months where she is lightheaded and weak, lasting hours. She has managed not to pass out but has learned that when a spell occurs she must quickly lie down. She is trying to eat more salt.  Allergies  Allergen Reactions  . Iohexol Hives     Code: HIVES, Desc: *NEED 13 HR PREDISONE AND BENADRYL  BE FOR IV CONTRAST GIVEN* /DR OLSON PT DEVELOPED HIVES AND ROOF OF MOUTH BEGIN "ITCHING"", Onset Date: [id number]   . Pyridium [Phenazopyridine Hcl] Other (See Comments)    vaginitis symptoms when on it     Current Outpatient Medications  Medication Sig Dispense Refill  . APPLE CIDER VINEGAR PO Take 1 tablet by mouth daily.    Marland Kitchen ascorbic acid (VITAMIN C) 500 MG tablet Take 500 mg by mouth daily.    . Biotin w/ Vitamins C & E (HAIR/SKIN/NAILS PO) Take 1 tablet by mouth daily.     . Chromium Picolinate 200 MCG CAPS Take 1 capsule by mouth daily.    . clonazePAM (KLONOPIN) 0.5 MG tablet Take 1 tablet (0.5 mg total) by mouth 2 (two) times daily as needed for anxiety. 20 tablet 1  . Coenzyme Q10 (COQ10) 100 MG CAPS Take 1 capsule by mouth daily.    . cyclobenzaprine (FLEXERIL) 10 MG tablet Take 1 tablet (10 mg total) by mouth 3 (three) times daily as needed for muscle spasms. 30 tablet 0  . Docosahexaenoic Acid (DHA COMPLETE PO) Take 1 tablet by mouth daily.    . influenza vac split quadrivalent PF (FLUARIX) 0.5 ML injection TO BE ADMINISTERED BY PHARMACIST (Patient taking differently: TO BE ADMINISTERED BY PHARMACIST) .5 mL 0  . levonorgestrel (MIRENA) 20 MCG/24HR IUD 1 each by Intrauterine route once.    . loratadine (CLARITIN) 10 MG tablet Take 10 mg by mouth daily as needed for allergies.    . metFORMIN (GLUCOPHAGE) 500 MG tablet TAKE 1  TABLET BY MOUTH EVERY MORNING. 90 tablet 0  . Multiple Vitamins-Minerals (BARIATRIC MULTIVITAMINS/IRON PO) Take 1 tablet by mouth in the morning and at bedtime.    Marland Kitchen nystatin cream (MYCOSTATIN) Apply 1 application topically 2 (two) times daily. Apply to affected area BID for up to 7 days. 30 g 1  . Probiotic Product (PROBIOTIC ADVANCED PO) Take 1 capsule by mouth daily.     Marland Kitchen Spirulina 500 MG TABS Take 1 tablet by mouth daily.    . SUMAtriptan (IMITREX) 50 MG tablet TAKE 1 TABLET BY MOUTH AT ONSET OF MIGRAINE, MAY REPEAT IN 2 HOURS IF HEADACHE PERSISTS 10 tablet 0  . Vitamin D, Ergocalciferol, (DRISDOL) 1.25 MG (50000 UNIT) CAPS capsule TAKE 1 CAPSULE BY MOUTH EVERY 7 DAYS 12 capsule 0   No current facility-administered medications for this visit.     Past Medical History:  Diagnosis Date  . Abnormal uterine bleeding   . Amenorrhea   . Anemia   . Anxiety    panic attacks  . Back pain   . Bradycardia   . Chest pain   . Constipation   . COVID 02/2020  . Diabetes ([place])   . Dysmenorrhea   . Endometriosis   . Gallbladder problem   . GERD (gastroesophageal reflux disease)  occasional - diet controlled and tums prn  . HLD (hyperlipidemia)   . HTN (hypertension)   . IBS (irritable bowel syndrome)   . Infertility, female   . Insulin resistance   . Migraine without aura   . Missed abortion    x 2 - both resolved without surgery  . Obesity   . PCOS (polycystic ovarian syndrome)   . PCOS (polycystic ovarian syndrome)   . PONV (postoperative nausea and vomiting)    pe rpatient ; scopalamine patch very helpful   . Sleep apnea   . SVD (spontaneous vaginal delivery)    x 1  . Syncope and collapse 06/21/2020  . Urinary incontinence   . Vitamin D deficiency     ROS:   All systems reviewed and negative except as noted in the HPI.   Past Surgical History:  Procedure Laterality Date  . CHOLECYSTECTOMY    . COLONOSCOPY  2017   polyps  . CYSTOSCOPY N/A 10/29/2016   Procedure:  CYSTOSCOPY;  Surgeon: Salvadore Dom, MD;  Location: [facility];  Service: Gynecology;  Laterality: N/A;  . DILATATION & CURRETTAGE/HYSTEROSCOPY WITH RESECTOCOPE N/A 10/17/2012   Procedure: DILATATION & CURETTAGE/HYSTEROSCOPY WITH RESECTOCOPE; Possible Polypectomy, Possible Resectoscopic Myomectomy.;  Surgeon: Floyce Stakes. Pamala Hurry, MD;  Location: [facility];  Service: Gynecology;  Laterality: N/A;  1 hr.  Marland Kitchen DILATION AND CURETTAGE OF UTERUS    . ENDOMETRIAL BIOPSY    . ENDOVENOUS ABLATION SAPHENOUS VEIN W/ LASER Right 05/08/2018   endovenous laser ablation right greater saphenous vein and stab phlebectomy > 20 incisions right leg by Deitra Mayo MD   . ESOPHAGOGASTRODUODENOSCOPY ENDOSCOPY  2017   Normal  . HYSTEROSCOPY WITH D & C N/A 12/01/2013   Procedure: IUD Removal;  Surgeon: Floyce Stakes. Pamala Hurry, MD;  Location: [facility];  Service: Gynecology;  Laterality: N/A;  90 min.  . INTRAUTERINE DEVICE (IUD) INSERTION N/A 10/17/2012   Procedure: INTRAUTERINE DEVICE (IUD) INSERTION; Mirena;  Surgeon: Floyce Stakes. Pamala Hurry, MD;  Location: [facility];  Service: Gynecology;  Laterality: N/A;  . LAPAROSCOPIC GASTRIC SLEEVE RESECTION N/A 02/19/2017   Procedure: LAPAROSCOPIC GASTRIC SLEEVE RESECTION, UPPER ENDOSCOPY;  Surgeon: Johnathan Hausen, MD;  Location: [facility];  Service: General;  Laterality: N/A;  . LAPAROSCOPIC SALPINGO OOPHERECTOMY Left 10/29/2016   Procedure: LAPAROSCOPIC SALPINGO OOPHORECTOMY With anterior uterine biopsy;  Surgeon: Salvadore Dom, MD;  Location: [facility];  Service: Gynecology;  Laterality: Left;  . LASIK  10/06/2018  . LOOP RECORDER INSERTION N/A 06/22/2020   Procedure: LOOP RECORDER INSERTION;  Surgeon: Evans Lance, MD;  Location: [facility];  Service: Cardiovascular;  Laterality: N/A;  . MOUTH SURGERY     wisdom teeth     Family History  Problem Relation Age of Onset  . Diabetes Father   . Thyroid disease Father   . Hypertension Father   . Parkinson's disease Father   .  Dementia Father   . Hyperlipidemia Father   . Heart disease Father   . Sleep apnea Father   . Obesity Father   . Diabetes Maternal Grandmother   . Heart disease Maternal Grandmother   . Diabetes Maternal Grandfather   . Heart disease Maternal Grandfather   . Stroke Maternal Grandfather   . Diabetes Paternal Grandmother   . Diabetes Paternal Grandfather   . Heart disease Paternal Grandfather   . Alcohol abuse Paternal Grandfather   . Hypertension Mother   . Diabetes Mother   . Depression Mother   . Obesity Mother   .  Colon cancer Maternal Uncle      Social History   Socioeconomic History  . Marital status: Married    Spouse name: Megahn Killings  . Number of children: 1  . Years of education: Not on file  . Highest education level: Not on file  Occupational History  . 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 Smoking status: Former Smoker    Packs/day: 0.25    Years: 19.00    Pack years: 4.75    Types: Cigarettes    Quit date: 06/06/2016    Years since quitting: 4.2  . Smokeless tobacco: Never Used  Vaping Use  . Vaping Use: Never used  Substance and Sexual Activity  . Alcohol use: Yes    Comment: 1 a month  . Drug use: No  . Sexual activity: Yes    Partners: Male    Birth control/protection: I.U.D.  Other Topics Concern  . Not on file  Social History Narrative  . Not on file   Social Determinants of Health   Financial Resource Strain: Not on file  Food Insecurity: Not on file  Transportation Needs: Not on file  Physical Activity: Not on file  Stress: Not on file  Social Connections: Not on file  Intimate Partner Violence: Not on file     BP 96/68   Pulse 73   Ht 5\' 8"  (1.727 m)   Wt 243 lb 9.6 oz (110.5 kg)   SpO2 98%   BMI 37.04 kg/m   Physical Exam:  Well appearing obese woman, NAD HEENT: Unremarkable Neck:  No JVD, no thyromegally Lymphatics:  No adenopathy Back:  No CVA tenderness Lungs:   Clear with no wheezes HEART:  Regular rate rhythm, no murmurs, no rubs, no clicks Abd:  soft, positive bowel sounds, no organomegally, no rebound, no guarding Ext:  2 plus pulses, no edema, no cyanosis, no clubbing Skin:  No rashes no nodules Neuro:  CN II through XII intact, motor grossly intact  DEVICE  Normal device function.  See PaceArt for details.   Assess/Plan: 1. Recurrent syncope - she has not passed out since I saw her last but come close. Her symptoms are not well controlled. I discussed both midodrine and florinef. We will try florinef.  2. Recent Covid infection - she thinks that her symptoms are completely resolved although I wonder if her autonomic dysfunction is a part of this.  3. Obesity - she is encouraged to lose weight.  4. ILR - I have reviewd her strips. She has sinus tachycardia. No long pauses.   Carleene Overlie Clarise Chacko,MD

## 2020-09-22 NOTE — Patient Instructions (Addendum)
Medication Instructions:  Your physician has recommended you make the following change in your medication:   1.  START florinef 0.1 mg- Take one tablet by mouth twice a day  Labwork: Please get a BMP in 2 weeks  Testing/Procedures: None ordered.  Follow-Up: Your physician wants you to follow-up in: 6 months with Cristopher Peru, MD or one of the following Advanced Practice Providers on your designated Care Team:    Chanetta Marshall, NP  Tommye Standard, PA-C  Legrand Como "Jonni Sanger" [place], [place]  Remote monthly monitoring is used to monitor your loop recorder from home.   Any Other Special Instructions Will Be Listed Below (If Applicable).  If you need a refill on your cardiac medications before your next appointment, please call your pharmacy.    Fludrocortisone tablets What is this medicine? FLUDROCORTISONE (floo droe KOR ti sone) is a corticosteroid. It is used to treat Addison's disease and to treat a salt losing condition called adrenogenital syndrome. This medicine may be used for other purposes; ask your health care provider or pharmacist if you have questions. COMMON BRAND NAME(S): Florinef What should I tell my health care provider before I take this medicine? They need to know if you have any of these conditions:  Cushing's syndrome  diabetes  heart problems or disease  high blood pressure  infection like herpes, measles, tuberculosis, or chickenpox  liver disease  myasthenia gravis  osteoporosis  stomach, ulcer or intestine disease including colitis and diverticulitis  thyroid problem  an unusual or allergic reaction to fludrocortisone, corticosteroids, other medicines, lactose, foods, dyes, or preservatives  pregnant or trying to get pregnant  breast-feeding How should I use this medicine? Take this medicine by mouth with a glass of water. Follow the directions on the prescription label. Take it with food or milk to avoid stomach upset. If you are taking this  medicine once a day, take it in the morning. Do not take more medicine than you are told to take. Do not suddenly stop taking your medicine because you may develop a severe reaction. Your doctor will tell you how much medicine to take. If your doctor wants you to stop the medicine, the dose may be slowly lowered over time to avoid any side effects. Talk to your pediatrician regarding the use of this medicine in children. Special care may be needed. Patients over 4 years old may have a stronger reaction and need a smaller dose. Overdosage: If you think you have taken too much of this medicine contact a poison control center or emergency room at once. NOTE: This medicine is only for you. Do not share this medicine with others. What if I miss a dose? If you miss a dose, take it as soon as you can. If it is almost time for your next dose, take only that dose. Do not take double or extra doses. What may interact with this medicine? Do not take this medicine with any of the following medications:  mifepristone, RU-486 This medicine may also interact with the following medications:  amphotericin B  aspirin and aspirin-like drugs  barbiturates like phenobarbital  digoxin  diuretics  female hormones, like estrogens or progestins and birth control pills  female hormones  medicines for diabetes like insulin  medicines that treat or prevent blood clots like warfarin  phenytoin  rifampin  vaccines This list may not describe all possible interactions. Give your health care provider a list of all the medicines, herbs, non-prescription drugs, or dietary supplements you use. Also  tell them if you smoke, drink alcohol, or use illegal drugs. Some items may interact with your medicine. What should I watch for while using this medicine? Visit your doctor or health care professional for regular checks on your progress. If you are taking this medicine over a prolonged period, carry an identification  card with your name and address, the type and dose of your medicine, and your doctor's name and address. This medicine may increase your risk of getting an infection. Stay away from people who are sick. Tell your doctor or health care professional if you are around anyone with measles or chickenpox. If you are going to have surgery, tell your doctor or health care professional that you have taken this medicine within the last twelve months. Ask your doctor or health care professional about your diet. You may need to lower the amount of salt you eat. This medicine may increase blood sugar. Ask your healthcare provider if changes in diet or medicines are needed if you have diabetes. What side effects may I notice from receiving this medicine? Side effects that you should report to your doctor or health care professional as soon as possible:  mental depression, mood swings, mistaken feelings of self importance or of being mistreated   signs and symptoms of high blood sugar such as being more thirsty or hungry or having to urinate more than normal. You may also feel very tired or have blurry vision.  sudden weight gain  swelling of the feet or lower legs Side effects that usually do not require medical attention (report to your doctor or health care professional if they continue or are bothersome):  dizziness  headache  loss of appetite  nausea, vomiting  trouble sleeping This list may not describe all possible side effects. Call your doctor for medical advice about side effects. You may report side effects to FDA at 1-800-FDA-1088. Where should I keep my medicine? Keep out of the reach of children. Store at room temperature between 15 and 30 degrees C (59 and 86 degrees F). Protect from excessive heat. Throw away any unused medicine after the expiration date. NOTE: This sheet is a summary. It may not cover all possible information. If you have questions about this medicine, talk to your  doctor, pharmacist, or health care provider.  2021 Elsevier/Gold Standard (2018-01-22 11:12:19)

## 2020-09-23 ENCOUNTER — Other Ambulatory Visit (HOSPITAL_COMMUNITY)
Admission: RE | Admit: 2020-09-23 | Discharge: 2020-09-23 | Disposition: A | Discharge status: Home / Self Care | Payer: No Typology Code available for payment source | Source: Ambulatory Visit | Attending: Physician Assistant | Admitting: Physician Assistant

## 2020-09-23 ENCOUNTER — Encounter: Payer: Self-pay | Admitting: Physician Assistant

## 2020-09-23 ENCOUNTER — Ambulatory Visit (INDEPENDENT_AMBULATORY_CARE_PROVIDER_SITE_OTHER): Payer: No Typology Code available for payment source | Admitting: Physician Assistant

## 2020-09-23 ENCOUNTER — Other Ambulatory Visit (HOSPITAL_COMMUNITY): Payer: Self-pay

## 2020-09-23 VITALS — BP 106/71 | HR 72 | Temp 97.8°F | Ht 68.0 in | Wt 241.9 lb

## 2020-09-23 DIAGNOSIS — Z872 Personal history of diseases of the skin and subcutaneous tissue: Secondary | ICD-10-CM

## 2020-09-23 DIAGNOSIS — N898 Other specified noninflammatory disorders of vagina: Secondary | ICD-10-CM | POA: Insufficient documentation

## 2020-09-23 DIAGNOSIS — Z124 Encounter for screening for malignant neoplasm of cervix: Secondary | ICD-10-CM

## 2020-09-23 DIAGNOSIS — Z113 Encounter for screening for infections with a predominantly sexual mode of transmission: Secondary | ICD-10-CM

## 2020-09-23 DIAGNOSIS — E119 Type 2 diabetes mellitus without complications: Secondary | ICD-10-CM

## 2020-09-23 DIAGNOSIS — Z0000 Encounter for general adult medical examination without abnormal findings: Secondary | ICD-10-CM | POA: Diagnosis not present

## 2020-09-23 DIAGNOSIS — E785 Hyperlipidemia, unspecified: Secondary | ICD-10-CM | POA: Diagnosis not present

## 2020-09-23 DIAGNOSIS — E1169 Type 2 diabetes mellitus with other specified complication: Secondary | ICD-10-CM

## 2020-09-23 DIAGNOSIS — N63 Unspecified lump in unspecified breast: Secondary | ICD-10-CM

## 2020-09-23 DIAGNOSIS — Z01419 Encounter for gynecological examination (general) (routine) without abnormal findings: Secondary | ICD-10-CM

## 2020-09-23 LAB — POCT URINALYSIS DIPSTICK
Bilirubin, UA: NEGATIVE
Glucose, UA: NEGATIVE
Ketones, UA: NEGATIVE
Leukocytes, UA: NEGATIVE
Nitrite, UA: NEGATIVE
Protein, UA: NEGATIVE
Spec Grav, UA: 1.01 (ref 1.010–1.025)
Urobilinogen, UA: 0.2 E.U./dL
pH, UA: 5.5 (ref 5.0–8.0)

## 2020-09-23 LAB — POCT UA - MICROALBUMIN
Albumin/Creatinine Ratio, Urine, POC: <30
Creatinine, POC: 200 mg/dL
Microalbumin Ur, POC: 10 mg/L

## 2020-09-23 LAB — HM PAP SMEAR: HM Pap smear: NORMAL

## 2020-09-23 LAB — RESULTS CONSOLE HPV: CHL HPV: NEGATIVE

## 2020-09-23 MED ORDER — METRONIDAZOLE 500 MG PO TABS
500.0000 mg | ORAL_TABLET | Freq: Three times a day (TID) | ORAL | 0 refills | Status: AC
  Filled 2020-09-23: qty 21, 7d supply, fill #0

## 2020-09-23 NOTE — Progress Notes (Signed)
Subjective:     [name] [name] is a 40 y.o. female and is here for a comprehensive physical exam. The patient reports problems - vaginal discharge.  Social History   Socioeconomic History  . Marital status: Married    Spouse name: Jacarra Bobak  . Number of children: 1  . Years of education: Not on file  . Highest education level: Not on file  Occupational History  . 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 Smoking status: Former Smoker    Packs/day: 0.25    Years: 19.00    Pack years: 4.75    Types: Cigarettes    Quit date: 06/06/2016    Years since quitting: 4.3  . Smokeless tobacco: Never Used  Vaping Use  . Vaping Use: Never used  Substance and Sexual Activity  . Alcohol use: Yes    Comment: 1 a month  . Drug use: No  . Sexual activity: Yes    Partners: Male    Birth control/protection: I.U.D.  Other Topics Concern  . Not on file  Social History Narrative  . Not on file   Social Determinants of Health   Financial Resource Strain: Not on file  Food Insecurity: Not on file  Transportation Needs: Not on file  Physical Activity: Not on file  Stress: Not on file  Social Connections: Not on file  Intimate Partner Violence: Not on file   Health Maintenance  Topic Date Due  . OPHTHALMOLOGY EXAM  12/18/2017  . PAP SMEAR-Modifier  03/15/2019  . COVID-19 Vaccine (3 - Booster for Pfizer series) 07/05/2020  . INFLUENZA VACCINE  12/05/2020  . HEMOGLOBIN A1C  02/17/2021  . FOOT EXAM  09/23/2021  . TETANUS/TDAP  11/21/2023  . PNEUMOCOCCAL POLYSACCHARIDE VACCINE AGE 21-64 HIGH RISK  Completed  . Hepatitis C Screening  Completed  . HIV Screening  Completed  . HPV VACCINES  Aged Out    The following portions of the patient's history were reviewed and updated as appropriate: allergies, current medications, past family history, past medical history, past social history, past surgical history and problem list.  Review of  Systems Pertinent items noted in HPI and remainder of comprehensive ROS otherwise negative.   Objective:    BP 106/71   Pulse 72   Temp 97.8 F (36.6 C)   Ht 5\' 8"  (1.727 m)   Wt 241 lb 14.4 oz (109.7 kg)   SpO2 98%   BMI 36.78 kg/m  General appearance: alert, cooperative and no distress Head: Normocephalic, without obvious abnormality, atraumatic Eyes: conjunctivae/corneas clear. PERRL, EOM's intact. Fundi benign. Ears: normal TM's and external ear canals both ears Nose: Nares normal. Septum midline. Mucosa normal. No drainage or sinus tenderness. Throat: lips, mucosa, and tongue normal; teeth and gums normal Neck: no adenopathy, no JVD, supple, symmetrical, trachea midline and thyroid not enlarged, symmetric, no tenderness/mass/nodules Back: symmetric, no curvature. ROM normal. No CVA tenderness. Lungs: clear to auscultation bilaterally Breasts: negative findings: no palpable axillary lymphadenopathy, positive findings: skin dimpling on the left upper inner quadrant and round and non-tender nodule located on the right lower outer quadrant Heart: regular rate and rhythm, S1, S2 normal, no murmur, click, rub or gallop Abdomen: soft, non-tender; bowel sounds normal; no masses,  no organomegaly Pelvic: no adnexal masses or tenderness, no cervical motion tenderness, positive findings: vaginal discharge:  white-grey discharge with mild malodour , rectovaginal septum normal, uterus normal size, shape, and consistency and vagina normal without  discharge IUD in place Extremities: extremities normal, atraumatic, no cyanosis or edema Pulses: 2+ and symmetric Skin: Skin color, texture, turgor normal. No rashes or lesions Lymph nodes: Cervical, supraclavicular, and axillary nodes normal. Neurologic: Grossly normal    Assessment:    Healthy female exam.     Plan:  -Will obtain non-fasting labs, STD screening, pap smear. Will place order for bilateral diagnostic mammogram. -UTD on  immunizations. -Will start empiric treatment for BV given signs and symptoms are suggestive of bacterial infection. Pending vaginal swab results will adjust treatment plan if indicated. -Continue weight loss efforts and program. -Follow a heart healthy diet and stay well hydrated. -Follow up in 6 months for migraine, DM, HLD   See After Visit Summary for Counseling Recommendations

## 2020-09-23 NOTE — Patient Instructions (Signed)
Preventive Care 11-40 Years Old, Female Preventive care refers to lifestyle choices and visits with your health care provider that can promote health and wellness. This includes:  A yearly physical exam. This is also called an annual wellness visit.  Regular dental and eye exams.  Immunizations.  Screening for certain conditions.  Healthy lifestyle choices, such as: ? Eating a healthy diet. ? Getting regular exercise. ? Not using drugs or products that contain nicotine and tobacco. ? Limiting alcohol use. What can I expect for my preventive care visit? Physical exam Your health care provider may check your:  Height and weight. These may be used to calculate your BMI (body mass index). BMI is a measurement that tells if you are at a healthy weight.  Heart rate and blood pressure.  Body temperature.  Skin for abnormal spots. Counseling Your health care provider may ask you questions about your:  Past medical problems.  Family's medical history.  Alcohol, tobacco, and drug use.  Emotional well-being.  Home life and relationship well-being.  Sexual activity.  Diet, exercise, and sleep habits.  Work and work Statistician.  Access to firearms.  Method of birth control.  Menstrual cycle.  Pregnancy history. What immunizations do I need? Vaccines are usually given at various ages, according to a schedule. Your health care provider will recommend vaccines for you based on your age, medical history, and lifestyle or other factors, such as travel or where you work.   What tests do I need? Blood tests  Lipid and cholesterol levels. These may be checked every 5 years starting at age 64.  Hepatitis C test.  Hepatitis B test. Screening  Diabetes screening. This is done by checking your blood sugar (glucose) after you have not eaten for a while (fasting).  STD (sexually transmitted disease) testing, if you are at risk.  BRCA-related cancer screening. This may  be done if you have a family history of breast, ovarian, tubal, or peritoneal cancers.  Pelvic exam and Pap test. This may be done every 3 years starting at age 61. Starting at age 82, this may be done every 5 years if you have a Pap test in combination with an HPV test. Talk with your health care provider about your test results, treatment options, and if necessary, the need for more tests.   Follow these instructions at home: Eating and drinking  Eat a healthy diet that includes fresh fruits and vegetables, whole grains, lean protein, and low-fat dairy products.  Take vitamin and mineral supplements as recommended by your health care provider.  Do not drink alcohol if: ? Your health care provider tells you not to drink. ? You are pregnant, may be pregnant, or are planning to become pregnant.  If you drink alcohol: ? Limit how much you have to 0-1 drink a day. ? Be aware of how much alcohol is in your drink. In the [place] one drink equals one 12 oz bottle of beer (355 mL), one 5 oz glass of wine (148 mL), or one 1 oz glass of hard liquor (44 mL).   Lifestyle  Take daily care of your teeth and gums. Brush your teeth every morning and night with fluoride toothpaste. Floss one time each day.  Stay active. Exercise for at least 30 minutes 5 or more days each week.  Do not use any products that contain nicotine or tobacco, such as cigarettes, e-cigarettes, and chewing tobacco. If you need help quitting, ask your health care provider.  Do  not use drugs.  If you are sexually active, practice safe sex. Use a condom or other form of protection to prevent STIs (sexually transmitted infections).  If you do not wish to become pregnant, use a form of birth control. If you plan to become pregnant, see your health care provider for a prepregnancy visit.  Find healthy ways to cope with stress, such as: ? Meditation, yoga, or listening to music. ? Journaling. ? Talking to a trusted  person. ? Spending time with friends and family. Safety  Always wear your seat belt while driving or riding in a vehicle.  Do not drive: ? If you have been drinking alcohol. Do not ride with someone who has been drinking. ? When you are tired or distracted. ? While texting.  Wear a helmet and other protective equipment during sports activities.  If you have firearms in your house, make sure you follow all gun safety procedures.  Seek help if you have been physically or sexually abused. What's next?  Go to your health care provider once a year for an annual wellness visit.  Ask your health care provider how often you should have your eyes and teeth checked.  Stay up to date on all vaccines. This information is not intended to replace advice given to you by your health care provider. Make sure you discuss any questions you have with your health care provider. Document Revised: 12/20/2019 Document Reviewed: 01/02/2018 Elsevier Patient Education  2021 [employer].

## 2020-09-26 ENCOUNTER — Other Ambulatory Visit: Payer: Self-pay | Admitting: Physician Assistant

## 2020-09-26 ENCOUNTER — Other Ambulatory Visit (HOSPITAL_COMMUNITY): Payer: Self-pay

## 2020-09-26 DIAGNOSIS — N63 Unspecified lump in unspecified breast: Secondary | ICD-10-CM

## 2020-09-26 DIAGNOSIS — Z872 Personal history of diseases of the skin and subcutaneous tissue: Secondary | ICD-10-CM

## 2020-09-26 LAB — CBC WITH DIFFERENTIAL/PLATELET
Basophils Absolute: 0.1 10*3/uL (ref 0.0–0.2)
Basos: 1 %
EOS (ABSOLUTE): 0.1 10*3/uL (ref 0.0–0.4)
Eos: 1 %
Hematocrit: 43.8 % (ref 34.0–46.6)
Hemoglobin: 14.4 g/dL (ref 11.1–15.9)
Immature Grans (Abs): 0 10*3/uL (ref 0.0–0.1)
Immature Granulocytes: 0 %
Lymphocytes Absolute: 1.9 10*3/uL (ref 0.7–3.1)
Lymphs: 21 %
MCH: 29.1 pg (ref 26.6–33.0)
MCHC: 32.9 g/dL (ref 31.5–35.7)
MCV: 89 fL (ref 79–97)
Monocytes Absolute: 0.7 10*3/uL (ref 0.1–0.9)
Monocytes: 7 %
Neutrophils Absolute: 6.4 10*3/uL (ref 1.4–7.0)
Neutrophils: 70 %
Platelets: 319 10*3/uL (ref 150–450)
RBC: 4.94 x10E6/uL (ref 3.77–5.28)
RDW: 12.4 % (ref 11.7–15.4)
WBC: 9.2 10*3/uL (ref 3.4–10.8)

## 2020-09-26 LAB — COMPREHENSIVE METABOLIC PANEL
ALT: 12 IU/L (ref 0–32)
AST: 14 IU/L (ref 0–40)
Albumin/Globulin Ratio: 1.3 (ref 1.2–2.2)
Albumin: 4.4 g/dL (ref 3.8–4.8)
Alkaline Phosphatase: 71 IU/L (ref 44–121)
BUN/Creatinine Ratio: 14 (ref 9–23)
BUN: 13 mg/dL (ref 6–20)
Bilirubin Total: 0.3 mg/dL (ref 0.0–1.2)
CO2: 21 mmol/L (ref 20–29)
Calcium: 9.7 mg/dL (ref 8.7–10.2)
Chloride: 103 mmol/L (ref 96–106)
Creatinine, Ser: 0.9 mg/dL (ref 0.57–1.00)
Globulin, Total: 3.3 g/dL (ref 1.5–4.5)
Glucose: 93 mg/dL (ref 65–99)
Potassium: 4.1 mmol/L (ref 3.5–5.2)
Sodium: 140 mmol/L (ref 134–144)
Total Protein: 7.7 g/dL (ref 6.0–8.5)
eGFR: 83 mL/min/{1.73_m2} (ref >59)

## 2020-09-26 LAB — HEPATITIS C ANTIBODY: Hep C Virus Ab: <0.1 s/co ratio (ref 0.0–0.9)

## 2020-09-26 LAB — HIV ANTIBODY (ROUTINE TESTING W REFLEX): HIV Screen 4th Generation wRfx: NONREACTIVE

## 2020-09-26 LAB — HEPATITIS B SURFACE ANTIBODY,QUALITATIVE: Hep B Surface Ab, Qual: NONREACTIVE

## 2020-09-26 LAB — LDL CHOLESTEROL, DIRECT: LDL Direct: 141 mg/dL — ABNORMAL HIGH (ref 0–99)

## 2020-09-26 LAB — HEMOGLOBIN A1C
Est. average glucose Bld gHb Est-mCnc: 117 mg/dL
Hgb A1c MFr Bld: 5.7 % — ABNORMAL HIGH (ref 4.8–5.6)

## 2020-09-26 LAB — RPR QUALITATIVE: RPR Ser Ql: NONREACTIVE

## 2020-09-26 LAB — TSH: TSH: 1 u[IU]/mL (ref 0.450–4.500)

## 2020-09-27 ENCOUNTER — Ambulatory Visit (INDEPENDENT_AMBULATORY_CARE_PROVIDER_SITE_OTHER): Payer: No Typology Code available for payment source | Admitting: Family Medicine

## 2020-09-27 ENCOUNTER — Encounter (INDEPENDENT_AMBULATORY_CARE_PROVIDER_SITE_OTHER): Payer: Self-pay | Admitting: Family Medicine

## 2020-09-27 ENCOUNTER — Other Ambulatory Visit: Payer: Self-pay

## 2020-09-27 ENCOUNTER — Other Ambulatory Visit: Payer: Self-pay | Admitting: Internal Medicine

## 2020-09-27 VITALS — BP 119/71 | HR 53 | Temp 98.4°F | Ht 68.0 in | Wt 236.0 lb

## 2020-09-27 DIAGNOSIS — Z9884 Bariatric surgery status: Secondary | ICD-10-CM

## 2020-09-27 DIAGNOSIS — G909 Disorder of the autonomic nervous system, unspecified: Secondary | ICD-10-CM | POA: Diagnosis not present

## 2020-09-27 DIAGNOSIS — Z9189 Other specified personal risk factors, not elsewhere classified: Secondary | ICD-10-CM

## 2020-09-27 DIAGNOSIS — E282 Polycystic ovarian syndrome: Secondary | ICD-10-CM

## 2020-09-27 DIAGNOSIS — Z6837 Body mass index (BMI) 37.0-37.9, adult: Secondary | ICD-10-CM

## 2020-09-27 LAB — NUSWAB VG, CANDIDA 6SP
C PARAPSILOSIS/TROPICALIS: NEGATIVE
Candida albicans, NAA: NEGATIVE
Candida glabrata, NAA: NEGATIVE
Candida krusei, NAA: NEGATIVE
Candida lusitaniae, NAA: NEGATIVE
Trich vag by NAA: NEGATIVE

## 2020-09-27 NOTE — Progress Notes (Signed)
[facility] Summary Report / Loop Recorder 

## 2020-09-29 LAB — CYTOLOGY - PAP
Chlamydia: NEGATIVE
Comment: NEGATIVE
Comment: NEGATIVE
Comment: NEGATIVE
Comment: NEGATIVE
Comment: NORMAL
Diagnosis: NEGATIVE
HSV1: NEGATIVE
HSV2: NEGATIVE
High risk HPV: NEGATIVE
Neisseria Gonorrhea: NEGATIVE
Trichomonas: NEGATIVE

## 2020-10-03 NOTE — Progress Notes (Deleted)
     Chief Complaint:   OBESITY [name] [name] is here to discuss her progress with her obesity treatment plan along with follow-up of her obesity related diagnoses. See Medical Weight Management Flowsheet for bioelectrical impedance results.  Interim History: ***  Nutrition Plan: {MWMwtlossportion/plan2:23431}. Activity: {MWM EXERCISE RECS:23473} Anti-obesity medications: ***. Reported side effects: ***.  Assessment/Plan:   1. PCOS (polycystic ovarian syndrome) ***  2. Autonomic dysfunction ***  3. History of bariatric surgery ***  4. At risk for malnutrition ***  5. Obesity, current BMI 36  Course: [name] [name] is currently in the action stage of change. As such, her goal is to continue with weight loss efforts.   Nutrition goals: She has agreed to {MWMwtlossportion/plan2:23431}.   Exercise goals: {MWM EXERCISE RECS:23473}  Behavioral modification strategies: {MWMwtlossdietstrategies3:23432}.  [name] [name] has agreed to follow-up with our clinic in {NUMBER 1-10:22536} weeks. She was informed of the importance of frequent follow-up visits to maximize her success with intensive lifestyle modifications for her multiple health conditions.   Objective:   Blood pressure 119/71, pulse (!) 53, temperature 98.4 F (36.9 C), temperature source Oral, height 5\' 8"  (1.727 m), weight 236 lb (107 kg), SpO2 98 %. Body mass index is 35.88 kg/m.  General: Cooperative, alert, well developed, in no acute distress. HEENT: Conjunctivae and lids unremarkable. Cardiovascular: Regular rhythm.  Lungs: Normal work of breathing. Neurologic: No focal deficits.   Lab Results  Component Value Date   CREATININE 0.90 09/23/2020   BUN 13 09/23/2020   NA 140 09/23/2020   K 4.1 09/23/2020   CL 103 09/23/2020   CO2 21 09/23/2020   Lab Results  Component Value Date   ALT 12 09/23/2020   AST 14 09/23/2020   ALKPHOS 71 09/23/2020   BILITOT 0.3 09/23/2020   Lab Results  Component Value Date   HGBA1C  5.7 (H) 09/23/2020   HGBA1C 5.8 (H) 08/18/2020   HGBA1C 5.6 10/14/2019   HGBA1C 5.7 (H) 06/18/2019   HGBA1C 5.3 01/08/2019   Lab Results  Component Value Date   INSULIN 11.0 08/18/2020   INSULIN 11.9 10/14/2019   INSULIN 10.1 06/18/2019   Lab Results  Component Value Date   TSH 1.000 09/23/2020   Lab Results  Component Value Date   CHOL 205 (H) 08/18/2020   HDL 49 08/18/2020   LDLCALC 144 (H) 08/18/2020   LDLDIRECT 141 (H) 09/23/2020   TRIG 65 08/18/2020   CHOLHDL 4.2 08/18/2020   Lab Results  Component Value Date   WBC 9.2 09/23/2020   HGB 14.4 09/23/2020   HCT 43.8 09/23/2020   MCV 89 09/23/2020   PLT 319 09/23/2020   Lab Results  Component Value Date   FERRITIN 9 (L) 03/14/2016   Attestation Statements:   Reviewed by clinician on day of visit: allergies, medications, problem list, medical history, surgical history, family history, social history, and previous encounter notes.

## 2020-10-04 ENCOUNTER — Other Ambulatory Visit: Payer: Self-pay

## 2020-10-04 ENCOUNTER — Other Ambulatory Visit (HOSPITAL_COMMUNITY): Payer: Self-pay

## 2020-10-04 ENCOUNTER — Telehealth: Payer: Self-pay | Admitting: Physician Assistant

## 2020-10-04 ENCOUNTER — Encounter: Payer: Self-pay | Admitting: Nurse Practitioner

## 2020-10-04 ENCOUNTER — Ambulatory Visit (INDEPENDENT_AMBULATORY_CARE_PROVIDER_SITE_OTHER): Payer: No Typology Code available for payment source | Admitting: Nurse Practitioner

## 2020-10-04 VITALS — BP 117/77 | HR 98 | Temp 99.3°F | Ht 68.0 in | Wt 239.2 lb

## 2020-10-04 DIAGNOSIS — M25511 Pain in right shoulder: Secondary | ICD-10-CM

## 2020-10-04 DIAGNOSIS — M542 Cervicalgia: Secondary | ICD-10-CM

## 2020-10-04 MED ORDER — KETOROLAC TROMETHAMINE 60 MG/2ML IM SOLN
60.0000 mg | Freq: Once | INTRAMUSCULAR | Status: AC
  Administered 2020-10-04: 60 mg via INTRAMUSCULAR

## 2020-10-04 MED ORDER — CYCLOBENZAPRINE HCL 10 MG PO TABS
10.0000 mg | ORAL_TABLET | Freq: Two times a day (BID) | ORAL | 0 refills | Status: DC | PRN
  Filled 2020-10-04: qty 30, 15d supply, fill #0

## 2020-10-04 MED ORDER — TRAMADOL HCL 50 MG PO TABS
50.0000 mg | ORAL_TABLET | Freq: Three times a day (TID) | ORAL | 0 refills | Status: DC | PRN
  Filled 2020-10-04: qty 15, 5d supply, fill #0

## 2020-10-04 NOTE — Telephone Encounter (Signed)
Patient has pulled something in her shoulder and it is going into her neck and the base of her skull. It is making patient has nausea from it  and she cannot turn her neck. It started on Saturday and got worse throughout the whole weekend. Please advise, thanks.

## 2020-10-04 NOTE — Progress Notes (Signed)
Chief Complaint:   OBESITY [name] [name] is here to discuss her progress with her obesity treatment plan along with follow-up of her obesity related diagnoses. See Medical Weight Management Flowsheet for bioelectrical impedance results.  Today's visit was #: 15 Starting weight: 243 lbs Starting date: 06/18/2019 Today's weight: 236 lbs Today's date: 09/27/2020 Weight change since last visit: 5 lbs Total lbs lost to date: 7 lbs Body mass index is 35.88 kg/m.  Total weight loss percentage to date: -2.88%  Interim History: [name] [name]'s A1c is 5.7.  She is still having episodes of lightheadedness.  He she was prescribed Florinef, but has not started taking it yet.    [name] [name] provided the following food recall today:  Breakfast:  Protein stake, 2 eggs/toast. Snack:  Apple/peaches. Lunch:  Roque Cash sandwich. Dinner:  4 ounces protein, 1/2 cup vegetables.  Nutrition Plan: keeping a food journal and adhering to recommended goals of 1300-1500 calories and 80 grams of protein. Activity: Walking for 30 mintues 3 times per week.  Assessment/Plan:   Diagnoses and all orders for this visit:  PCOS (polycystic ovarian syndrome) Comments: Continue current treatment.  Autonomic dysfunction Comments: Stable. Reviewed recent cardiac work-up. She is fearful of new prescription, Florinef. Discussed risk v benefit.   History of bariatric surgery Comments: Continue Bariatric MVM.   At risk for malnutrition [name] [name] was given extensive malnutrition prevention education and counseling today of more than 8 minutes.  Counseled her that malnutrition refers to inappropriate nutrients or not the right balance of nutrients for optimal health.  Discussed with [name] [name] that it is absolutely possible to be malnourished but yet obese.  Risk factors, including but not limited to, inappropriate dietary choices, difficulty with obtaining food due to physical or financial limitations, and various physical  and mental health conditions were reviewed.  Obesity, current BMI 36  Course: [name] [name] is currently in the action stage of change. As such, her goal is to continue with weight loss efforts.   Nutrition goals: She has agreed to practicing portion control and making smarter food choices, such as increasing vegetables and decreasing simple carbohydrates.   Exercise goals: As tolerated.  Behavioral modification strategies: increasing lean protein intake, decreasing simple carbohydrates, increasing vegetables and increasing water intake.  [name] [name] has agreed to follow-up with our clinic in 3 weeks. She was informed of the importance of frequent follow-up visits to maximize her success with intensive lifestyle modifications for her multiple health conditions.   Objective:   Blood pressure 119/71, pulse (!) 53, temperature 98.4 F (36.9 C), temperature source Oral, height 5\' 8"  (1.727 m), weight 236 lb (107 kg), SpO2 98 %. Body mass index is 35.88 kg/m.  General: Cooperative, alert, well developed, in no acute distress. HEENT: Conjunctivae and lids unremarkable. Cardiovascular: Regular rhythm.  Lungs: Normal work of breathing. Neurologic: No focal deficits.   Lab Results  Component Value Date   CREATININE 0.90 09/23/2020   BUN 13 09/23/2020   NA 140 09/23/2020   K 4.1 09/23/2020   CL 103 09/23/2020   CO2 21 09/23/2020   Lab Results  Component Value Date   ALT 12 09/23/2020   AST 14 09/23/2020   ALKPHOS 71 09/23/2020   BILITOT 0.3 09/23/2020   Lab Results  Component Value Date   HGBA1C 5.7 (H) 09/23/2020   HGBA1C 5.8 (H) 08/18/2020   HGBA1C 5.6 10/14/2019   HGBA1C 5.7 (H) 06/18/2019   HGBA1C 5.3 01/08/2019   Lab Results  Component Value Date  INSULIN 11.0 08/18/2020   INSULIN 11.9 10/14/2019   INSULIN 10.1 06/18/2019   Lab Results  Component Value Date   TSH 1.000 09/23/2020   Lab Results  Component Value Date   CHOL 205 (H) 08/18/2020   HDL 49 08/18/2020    LDLCALC 144 (H) 08/18/2020   LDLDIRECT 141 (H) 09/23/2020   TRIG 65 08/18/2020   CHOLHDL 4.2 08/18/2020   Lab Results  Component Value Date   WBC 9.2 09/23/2020   HGB 14.4 09/23/2020   HCT 43.8 09/23/2020   MCV 89 09/23/2020   PLT 319 09/23/2020   Lab Results  Component Value Date   FERRITIN 9 (L) 03/14/2016   Attestation Statements:   Reviewed by clinician on day of visit: allergies, medications, problem list, medical history, surgical history, family history, social history, and previous encounter notes.  I, Water quality scientist, CMA, am acting as transcriptionist for Briscoe Deutscher, DO  I have reviewed the above documentation for accuracy and completeness, and I agree with the above. Briscoe Deutscher, DO

## 2020-10-04 NOTE — Progress Notes (Signed)
Acute Office Visit  Subjective:    [name], female    DOB: 1980-05-09, 40 y.o.   MRN: [id number]  Chief Complaint  Patient presents with  . Neck Pain    HPI Patient is in today for evaluation of pain in right shoulder and neck pain on right side. She states that she fell off her treadmill on Friday. States that she did not experience any pain or stiffness at initial even. She states that she woke up Saturday morning with severe pain in right shoulder. This has been gradually getting worse. The pain is now radiating into the right side of the neck and right clavicular area. Contributing to developing into headache. She is unable to turn her head to the left or right. Flexion and extension of the neck is also difficult. She states that this morning, she has also noted tingling in the fingers of her right arm. She is unable to actively raise her arm above 30 degrees in any direction. She states that her pain is about 8/10 in severity. Pain was so severe, she was unable to got to work today. Movement and exertion make this pain worse. Nothing hsa improved the pain.   Past Medical History:  Diagnosis Date  . Abnormal uterine bleeding   . Amenorrhea   . Anemia   . Anxiety    panic attacks  . Back pain   . Bradycardia   . Chest pain   . Constipation   . COVID 02/2020  . Diabetes ([place])   . Dysmenorrhea   . Endometriosis   . Gallbladder problem   . GERD (gastroesophageal reflux disease)    occasional - diet controlled and tums prn  . HLD (hyperlipidemia)   . HTN (hypertension)   . IBS (irritable bowel syndrome)   . Infertility, female   . Insulin resistance   . Migraine without aura   . Missed abortion    x 2 - both resolved without surgery  . Obesity   . PCOS (polycystic ovarian syndrome)   . PCOS (polycystic ovarian syndrome)   . PONV (postoperative nausea and vomiting)    pe rpatient ; scopalamine patch very helpful   . Sleep apnea   . SVD (spontaneous  vaginal delivery)    x 1  . Syncope and collapse 06/21/2020  . Urinary incontinence   . Vitamin D deficiency     Past Surgical History:  Procedure Laterality Date  . CHOLECYSTECTOMY    . COLONOSCOPY  2017   polyps  . CYSTOSCOPY N/A 10/29/2016   Procedure: CYSTOSCOPY;  Surgeon: Salvadore Dom, MD;  Location: [facility];  Service: Gynecology;  Laterality: N/A;  . DILATATION & CURRETTAGE/HYSTEROSCOPY WITH RESECTOCOPE N/A 10/17/2012   Procedure: DILATATION & CURETTAGE/HYSTEROSCOPY WITH RESECTOCOPE; Possible Polypectomy, Possible Resectoscopic Myomectomy.;  Surgeon: Floyce Stakes. Pamala Hurry, MD;  Location: [facility];  Service: Gynecology;  Laterality: N/A;  1 hr.  Marland Kitchen DILATION AND CURETTAGE OF UTERUS    . ENDOMETRIAL BIOPSY    . ENDOVENOUS ABLATION SAPHENOUS VEIN W/ LASER Right 05/08/2018   endovenous laser ablation right greater saphenous vein and stab phlebectomy > 20 incisions right leg by Deitra Mayo MD   . ESOPHAGOGASTRODUODENOSCOPY ENDOSCOPY  2017   Normal  . HYSTEROSCOPY WITH D & C N/A 12/01/2013   Procedure: IUD Removal;  Surgeon: Floyce Stakes. Pamala Hurry, MD;  Location: [facility];  Service: Gynecology;  Laterality: N/A;  90 min.  . INTRAUTERINE DEVICE (IUD) INSERTION N/A 10/17/2012   Procedure:  INTRAUTERINE DEVICE (IUD) INSERTION; Mirena;  Surgeon: Floyce Stakes. Pamala Hurry, MD;  Location: [facility];  Service: Gynecology;  Laterality: N/A;  . LAPAROSCOPIC GASTRIC SLEEVE RESECTION N/A 02/19/2017   Procedure: LAPAROSCOPIC GASTRIC SLEEVE RESECTION, UPPER ENDOSCOPY;  Surgeon: Johnathan Hausen, MD;  Location: [facility];  Service: General;  Laterality: N/A;  . LAPAROSCOPIC SALPINGO OOPHERECTOMY Left 10/29/2016   Procedure: LAPAROSCOPIC SALPINGO OOPHORECTOMY With anterior uterine biopsy;  Surgeon: Salvadore Dom, MD;  Location: [facility];  Service: Gynecology;  Laterality: Left;  . LASIK  10/06/2018  . LOOP RECORDER INSERTION N/A 06/22/2020   Procedure: LOOP RECORDER INSERTION;  Surgeon: Evans Lance, MD;   Location: [facility];  Service: Cardiovascular;  Laterality: N/A;  . MOUTH SURGERY     wisdom teeth    Family History  Problem Relation Age of Onset  . Diabetes Father   . Thyroid disease Father   . Hypertension Father   . Parkinson's disease Father   . Dementia Father   . Hyperlipidemia Father   . Heart disease Father   . Sleep apnea Father   . Obesity Father   . Diabetes Maternal Grandmother   . Heart disease Maternal Grandmother   . Diabetes Maternal Grandfather   . Heart disease Maternal Grandfather   . Stroke Maternal Grandfather   . Diabetes Paternal Grandmother   . Diabetes Paternal Grandfather   . Heart disease Paternal Grandfather   . Alcohol abuse Paternal Grandfather   . Hypertension Mother   . Diabetes Mother   . Depression Mother   . Obesity Mother   . Colon cancer Maternal Uncle     Social History   Socioeconomic History  . Marital status: Married    Spouse name: Tomasa Dobransky  . Number of children: 1  . Years of education: Not on file  . Highest education level: Not on file  Occupational History  . 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 Smoking status: Former Smoker    Packs/day: 0.25    Years: 19.00    Pack years: 4.75    Types: Cigarettes    Quit date: 06/06/2016    Years since quitting: 4.3  . Smokeless tobacco: Never Used  Vaping Use  . Vaping Use: Never used  Substance and Sexual Activity  . Alcohol use: Yes    Comment: 1 a month  . Drug use: No  . Sexual activity: Yes    Partners: Male    Birth control/protection: I.U.D.  Other Topics Concern  . Not on file  Social History Narrative  . Not on file   Social Determinants of Health   Financial Resource Strain: Not on file  Food Insecurity: Not on file  Transportation Needs: Not on file  Physical Activity: Not on file  Stress: Not on file  Social Connections: Not on file  Intimate Partner Violence: Not on file    Outpatient  Medications Prior to Visit  Medication Sig Dispense Refill  . APPLE CIDER VINEGAR PO Take 1 tablet by mouth daily.    Marland Kitchen ascorbic acid (VITAMIN C) 500 MG tablet Take 500 mg by mouth daily.    . Biotin w/ Vitamins C & E (HAIR/SKIN/NAILS PO) Take 1 tablet by mouth daily.     . Chromium Picolinate 200 MCG CAPS Take 1 capsule by mouth daily.    . clonazePAM (KLONOPIN) 0.5 MG tablet Take 1 tablet (0.5 mg total) by mouth 2 (two) times daily as  needed for anxiety. 20 tablet 1  . Coenzyme Q10 (COQ10) 100 MG CAPS Take 1 capsule by mouth daily.    . Docosahexaenoic Acid (DHA COMPLETE PO) Take 1 tablet by mouth daily.    . fludrocortisone (FLORINEF) 0.1 MG tablet Take 1 tablet (0.1 mg total) by mouth 2 (two) times daily. 60 tablet 11  . influenza vac split quadrivalent PF (FLUARIX) 0.5 ML injection TO BE ADMINISTERED BY PHARMACIST (Patient taking differently: TO BE ADMINISTERED BY PHARMACIST) .5 mL 0  . levonorgestrel (MIRENA) 20 MCG/24HR IUD 1 each by Intrauterine route once.    . loratadine (CLARITIN) 10 MG tablet Take 10 mg by mouth daily as needed for allergies.    . metFORMIN (GLUCOPHAGE) 500 MG tablet TAKE 1 TABLET BY MOUTH EVERY MORNING. 90 tablet 0  . Multiple Vitamins-Minerals (BARIATRIC MULTIVITAMINS/IRON PO) Take 1 tablet by mouth in the morning and at bedtime.    Marland Kitchen nystatin cream (MYCOSTATIN) Apply 1 application topically 2 (two) times daily. Apply to affected area BID for up to 7 days. 30 g 1  . Probiotic Product (PROBIOTIC ADVANCED PO) Take 1 capsule by mouth daily.     Marland Kitchen Spirulina 500 MG TABS Take 1 tablet by mouth daily.    . SUMAtriptan (IMITREX) 50 MG tablet TAKE 1 TABLET BY MOUTH AT ONSET OF MIGRAINE, MAY REPEAT IN 2 HOURS IF HEADACHE PERSISTS 10 tablet 0  . Vitamin D, Ergocalciferol, (DRISDOL) 1.25 MG (50000 UNIT) CAPS capsule TAKE 1 CAPSULE BY MOUTH EVERY 7 DAYS 12 capsule 0  . cyclobenzaprine (FLEXERIL) 10 MG tablet Take 1 tablet (10 mg total) by mouth 3 (three) times daily as needed  for muscle spasms. 30 tablet 0   No facility-administered medications prior to visit.    Allergies  Allergen Reactions  . Iohexol Hives     Code: HIVES, Desc: *NEED 13 HR PREDISONE AND BENADRYL  BE FOR IV CONTRAST GIVEN* /DR OLSON PT DEVELOPED HIVES AND ROOF OF MOUTH BEGIN "ITCHING"", Onset Date: [id number]   . Pyridium [Phenazopyridine Hcl] Other (See Comments)    vaginitis symptoms when on it    Review of Systems  Constitutional: Positive for activity change. Negative for chills and fever.       Decreased activity due to pain and tenderness of right shoulder and right side of the neck.   HENT: Negative for congestion, postnasal drip, rhinorrhea, sinus pressure and sinus pain.   Respiratory: Negative for cough, chest tightness, shortness of breath and wheezing.   Cardiovascular: Negative for chest pain and palpitations.  Gastrointestinal: Negative for constipation, diarrhea, nausea and vomiting.  Endocrine: Negative.   Musculoskeletal: Positive for arthralgias, myalgias, neck pain and neck stiffness. Negative for back pain.       Right shoulder pain along with pain in neck, just to the right of the neck.  ROM and strength of the neck and shoulder are both significantly reduced to to pain.   Skin: Negative for rash.  Allergic/Immunologic: Negative.   Neurological: Negative.  Negative for dizziness, weakness and headaches.  Hematological: Negative.   Psychiatric/Behavioral: Negative.  The patient is not nervous/anxious.        Objective:    Physical Exam Vitals and nursing note reviewed.  Constitutional:      General: She is in acute distress.     Appearance: Normal appearance. She is well-developed.  HENT:     Head: Normocephalic.     Nose: Nose normal.  Eyes:     Conjunctiva/sclera: Conjunctivae normal.  Pupils: Pupils are equal, round, and reactive to light.  Cardiovascular:     Rate and Rhythm: Normal rate and regular rhythm.     Pulses: Normal pulses.     Heart  sounds: Normal heart sounds.  Pulmonary:     Effort: Pulmonary effort is normal.     Breath sounds: Normal breath sounds.  Abdominal:     Palpations: Abdomen is soft.  Musculoskeletal:        General: Tenderness present. Normal range of motion.     Cervical back: Normal range of motion and neck supple.     Comments: There is moderate tenderness of right shoulder, stretching into right clavicular area. She has diminished ROM and strength of right arm due to pain. There is also tenderness of the neck. Patient with reduced ability to move head to left or right. Ability to flex and extend the neck also diminished. There are no palpable abnormalities or deformities noted at this time .   Skin:    General: Skin is warm and dry.     Capillary Refill: Capillary refill takes less than 2 seconds.  Neurological:     General: No focal deficit present.     Mental Status: She is alert and oriented to person, place, and time.  Psychiatric:        Mood and Affect: Mood normal.        Behavior: Behavior normal.        Thought Content: Thought content normal.        Judgment: Judgment normal.     Today's Vitals   10/04/20 1435  BP: 117/77  Pulse: 98  Temp: 99.3 F (37.4 C)  SpO2: 98%  Weight: 239 lb 3.2 oz (108.5 kg)  Height: _0  (1.727 m)   Body mass index is 36.37 kg/m.   Wt Readings from Last 3 Encounters:  10/04/20 239 lb 3.2 oz (108.5 kg)  09/27/20 236 lb (107 kg)  09/23/20 241 lb 14.4 oz (109.7 kg)    Health Maintenance Due  Topic Date Due  . Pneumococcal Vaccine 38-71 Years old (1 of 4 - PCV13) Never done  . OPHTHALMOLOGY EXAM  12/18/2017  . COVID-19 Vaccine (3 - Booster for Pfizer series) 07/05/2020    There are no preventive care reminders to display for this patient.   Lab Results  Component Value Date   TSH 1.000 09/23/2020   Lab Results  Component Value Date   WBC 9.2 09/23/2020   HGB 14.4 09/23/2020   HCT 43.8 09/23/2020   MCV 89 09/23/2020   PLT 319  09/23/2020   Lab Results  Component Value Date   NA 140 09/23/2020   K 4.1 09/23/2020   CO2 21 09/23/2020   GLUCOSE 93 09/23/2020   BUN 13 09/23/2020   CREATININE 0.90 09/23/2020   BILITOT 0.3 09/23/2020   ALKPHOS 71 09/23/2020   AST 14 09/23/2020   ALT 12 09/23/2020   PROT 7.7 09/23/2020   ALBUMIN 4.4 09/23/2020   CALCIUM 9.7 09/23/2020   ANIONGAP 9 09/01/2020   EGFR 83 09/23/2020   Lab Results  Component Value Date   CHOL 205 (H) 08/18/2020   Lab Results  Component Value Date   HDL 49 08/18/2020   Lab Results  Component Value Date   LDLCALC 144 (H) 08/18/2020   Lab Results  Component Value Date   TRIG 65 08/18/2020   Lab Results  Component Value Date   CHOLHDL 4.2 08/18/2020   Lab Results  Component  Value Date   HGBA1C 5.7 (H) 09/23/2020       Assessment & Plan:  1. Acute pain of right shoulder There is significant pain of right shoulder after falling off treadmill on Friday. Short term prescription for tramadol given to use up to three times daily as needed for mdoerate pain. May also take flexeril up to twice daily as needed for muscle pain and tightness. Will get x-ray of the right shoulder for further evaluation. Refer to orthopedics as indicated. Recommend she rest and ice the right shoulder in 20 minute intervals to reduce both pain and inflammation.  - traMADol (ULTRAM) 50 MG tablet; Take 1 tablet (50 mg total) by mouth every 8 (eight) hours as needed.  Dispense: 15 tablet; Refill: 0 - cyclobenzaprine (FLEXERIL) 10 MG tablet; Take 1 tablet (10 mg total) by mouth 2 (two) times daily as needed for muscle spasms.  Dispense: 30 tablet; Refill: 0 - DG Shoulder Right; Future - DG Cervical Spine Complete; Future - ketorolac (TORADOL) injection 60 mg  2. Neck pain on right side Pain radiating to right side of the neck, decreasing ROM of the neck. Short term prescription for tramadol given to use up to three times daily as needed for mdoerate pain. May also  take flexeril up to twice daily as needed for muscle pain and tightness. Will get x-ray of cervical spine for further evaluation. Refer to orthopedics as indicated.  - traMADol (ULTRAM) 50 MG tablet; Take 1 tablet (50 mg total) by mouth every 8 (eight) hours as needed.  Dispense: 15 tablet; Refill: 0 - cyclobenzaprine (FLEXERIL) 10 MG tablet; Take 1 tablet (10 mg total) by mouth 2 (two) times daily as needed for muscle spasms.  Dispense: 30 tablet; Refill: 0 - DG Shoulder Right; Future - DG Cervical Spine Complete; Future - ketorolac (TORADOL) injection 60 mg  Problem List Items Addressed This Visit      Other   Acute pain of right shoulder - Primary   Relevant Medications   traMADol (ULTRAM) 50 MG tablet   cyclobenzaprine (FLEXERIL) 10 MG tablet   Other Relevant Orders   DG Shoulder Right (Completed)   DG Cervical Spine Complete (Completed)   Neck pain on right side   Relevant Medications   traMADol (ULTRAM) 50 MG tablet   cyclobenzaprine (FLEXERIL) 10 MG tablet   Other Relevant Orders   DG Shoulder Right (Completed)   DG Cervical Spine Complete (Completed)       Meds ordered this encounter  Medications  . traMADol (ULTRAM) 50 MG tablet    Sig: Take 1 tablet (50 mg total) by mouth every 8 (eight) hours as needed.    Dispense:  15 tablet    Refill:  0    Order Specific Question:   Supervising Provider    Answer:   Beatrice Lecher D [[id number]]  . cyclobenzaprine (FLEXERIL) 10 MG tablet    Sig: Take 1 tablet (10 mg total) by mouth 2 (two) times daily as needed for muscle spasms.    Dispense:  30 tablet    Refill:  0    Order Specific Question:   Supervising Provider    Answer:   Beatrice Lecher D [[id number]]  . ketorolac (TORADOL) injection 60 mg     Ronnell Freshwater, NP

## 2020-10-04 NOTE — Telephone Encounter (Signed)
Patient added to todays schedule at 215pm. AS, CMA

## 2020-10-04 NOTE — Patient Instructions (Signed)
Shoulder Pain Many things can cause shoulder pain, including:  An injury.  Moving the shoulder in the same way again and again (overuse).  Joint pain (arthritis). Pain can come from:  Swelling and irritation (inflammation) of any part of the shoulder.  An injury to the shoulder joint.  An injury to: ? Tissues that connect muscle to bone (tendons). ? Tissues that connect bones to each other (ligaments). ? Bones. Follow these instructions at home: Watch for changes in your symptoms. Let your doctor know about them. Follow these instructions to help with your pain. If you have a sling:  Wear the sling as told by your doctor. Remove it only as told by your doctor.  Loosen the sling if your fingers: ? Tingle. ? Become numb. ? Turn cold and blue.  Keep the sling clean.  If the sling is not waterproof: ? Do not let it get wet. ? Take the sling off when you shower or bathe. Managing pain, stiffness, and swelling  If told, put ice on the painful area: ? Put ice in a plastic bag. ? Place a towel between your skin and the bag. ? Leave the ice on for 20 minutes, 2-3 times a day. Stop putting ice on if it does not help with the pain.  Squeeze a soft ball or a foam pad as much as possible. This prevents swelling in the shoulder. It also helps to strengthen the arm.   General instructions  Take over-the-counter and prescription medicines only as told by your doctor.  Keep all follow-up visits as told by your doctor. This is important. Contact a doctor if:  Your pain gets worse.  Medicine does not help your pain.  You have new pain in your arm, hand, or fingers. Get help right away if:  Your arm, hand, or fingers: ? Tingle. ? Are numb. ? Are swollen. ? Are painful. ? Turn white or blue. Summary  Shoulder pain can be caused by many things. These include injury, moving the shoulder in the same away again and again, and joint pain.  Watch for changes in your symptoms.  Let your doctor know about them.  This condition may be treated with a sling, ice, and pain medicine.  Contact your doctor if the pain gets worse or you have new pain. Get help right away if your arm, hand, or fingers tingle or get numb, swollen, or painful.  Keep all follow-up visits as told by your doctor. This is important. This information is not intended to replace advice given to you by your health care provider. Make sure you discuss any questions you have with your health care provider. Document Revised: 11/05/2017 Document Reviewed: 11/05/2017 Elsevier Patient Education  2021 [employer].  Cervical Sprain A cervical sprain is also called a neck sprain. It is a stretch or tear in one or more ligaments in the neck. Ligaments are tissues that connect bones to each other. Neck sprains can be mild, bad, or very bad. A very bad sprain in the neck can cause the bones in the neck to be unstable. This can damage the spinal cord. It can also cause serious problems in the brain, spinal cord, and nerves (nervous system). Most neck sprains heal in 4-6 weeks. It can take more or less time depending on:  What caused the injury.  The amount of injury. What are the causes? Neck sprains may be caused by trauma, such as:  An injury from an accident in a vehicle such  as a car or boat.  A fall.  The head and neck being moved front to back or side to side all of a sudden (whiplash injury). Mild neck sprains may be caused by wear and tear over time. What increases the risk? The following factors may make you more likely to develop this condition:  Taking part in activities that put you at high risk of hurting your neck. These include: ? Contact sports. ? Animator. ? Gymnastics. ? Diving.  Taking risks when driving or riding in a vehicle such as a car or boat.  Arthritis caused by wear and tear of the joints in the spine.  The neck not being very strong or flexible.  Having had a neck  injury in the past.  Poor posture.  Spending a lot of time in certain positions that put stress on the neck. This may be from sitting at a computer for a long time. What are the signs or symptoms? Symptoms of this condition include:  Your neck, shoulders, or upper back feeling: ? Painful or sore. ? Stiff. ? Tender. ? Swollen. ? Hot, or like it is burning.  Sudden tightening of neck muscles (spasms).  Not being able to move the neck very much.  Headache.  Feeling dizzy.  Feeling like you may vomit, or vomiting.  Having a hand or arm that: ? Feels weak. ? Loses feeling (feels numb). ? Tingles. You may get symptoms right away after injury, or you may get them over a few days. In some cases, symptoms may go away with treatment and come back over time. How is this treated? This condition is treated by:  Resting your neck.  Icing the part of your neck that is hurt.  Doing exercises to restore movement and strength to your neck (physical therapy). If there is no swelling, you may use heat therapy 2-3 days after the injury took place. If your injury is very bad, treatment may also include:  Keeping your neck in place for a length of time. This may be done using: ? A neck collar. This supports your chin and the back of your head. ? A cervical traction device. This is a sling that holds up your head. The sling removes weight and pressure from your neck. It may also help to relieve pain.  Medicines that help with: ? Pain. ? Irritation and swelling (inflammation).  Medicines that help to relax your muscles (muscle relaxants).  Surgery. This is rare. Follow these instructions at home: Medicines  Take over-the-counter and prescription medicines only as told by your doctor.  Ask your doctor if the medicine prescribed to you: ? Requires you to avoid driving or using heavy machinery. ? Can cause trouble pooping (constipation). You may need to take these actions to prevent or  treat trouble pooping:  Drink enough fluid to keep your pee (urine) pale yellow.  Take over-the-counter or prescription medicines.  Eat foods that are high in fiber. These include beans, whole grains, and fresh fruits and vegetables.  Limit foods that are high in fat and sugar. These include fried or sweet foods.   If you have a neck collar:  Wear it as told by your doctor. Do not take it off unless told.  Ask your doctor before adjusting your collar.  If you have long hair, keep it outside of the collar.  Ask your doctor if you may take off the collar for cleaning and bathing. If you may take off the collar: ?  Follow instructions about how to take it off safely. ? Clean it by hand with mild soap and water. Let it air-dry fully. ? If your collar has pads that you can take out:  Take the pads out every 1-2 days.  Wash them by hand with soap and water.  Let the pads air-dry fully before you put them back in the collar. ? Tell your doctor if your skin under the collar has irritation or sores. Managing pain, stiffness, and swelling  Use a cervical traction device, if told by your doctor.  If told, put ice on the affected area. To do this: ? Put ice in a plastic bag. ? Place a towel between your skin and the bag. ? Leave the ice on for 20 minutes, 2-3 times a day.  If told, put heat on the affected area. Do this before exercise or as often as told by your doctor. Use the heat source that your doctor recommends, such as a moist heat pack or a heating pad. ? Place a towel between your skin and the heat source. ? Leave the heat on for 20-30 minutes. ? Take the heat off if your skin turns bright red. This is very important if you cannot feel pain, heat, or cold. You may have a greater risk of getting burned.      Activity  Do not drive while wearing a neck collar. If you do not have a neck collar, ask if it is safe to drive while your neck heals.  Do not lift anything that is  heavier than 10 lb (4.5 kg), or the limit that you are told, until your doctor tells you that it is safe.  Rest as told by your doctor.  Do exercises as told by your doctor or physical therapist.  Return to your normal activities as told by your doctor. Avoid positions and activities that make you feel worse. Ask your doctor what activities are safe for you. General instructions  Do not use any products that contain nicotine or tobacco, such as cigarettes, e-cigarettes, and chewing tobacco. These can delay healing. If you need help quitting, ask your doctor.  Keep all follow-up visits as told by your doctor or physical therapist. This is important. How is this prevented? To prevent a neck sprain from happening again:  Practice good posture. Adjust your workstation to help you do this.  Exercise regularly as told by your doctor or physical therapist.  Avoid activities that are risky or may cause a neck sprain. Contact a doctor if:  Your symptoms get worse.  Your symptoms do not get better after 2 weeks of treatment.  Your pain gets worse.  Medicine does not help your pain.  You have new symptoms that you cannot explain.  Your neck collar gives you sores on your skin or bothers your skin. Get help right away if:  You have very bad pain.  You get any of the following in any part of your body: ? Loss of feeling. ? Tingling. ? Weakness.  You cannot move a part of your body.  You have neck pain and either of these: ? Very bad dizziness. ? A very bad headache. Summary  A cervical sprain is also called a neck sprain. It is a stretch or tear in one or more ligaments in the neck. Ligaments are tissues that connect bones.  Neck sprains may be caused by trauma, such as an injury or a fall.  You may get symptoms right away  after injury, or you may get them over a few days.  Neck sprains may be treated with rest, heat, ice, medicines, exercise, and surgery. This information  is not intended to replace advice given to you by your health care provider. Make sure you discuss any questions you have with your health care provider. Document Revised: 12/31/2018 Document Reviewed: 12/31/2018 Elsevier Patient Education  [employer].

## 2020-10-05 ENCOUNTER — Ambulatory Visit
Admission: RE | Admit: 2020-10-05 | Discharge: 2020-10-05 | Disposition: A | Discharge status: Home / Self Care | Payer: No Typology Code available for payment source | Source: Ambulatory Visit | Attending: Nurse Practitioner | Admitting: Nurse Practitioner

## 2020-10-05 ENCOUNTER — Ambulatory Visit (INDEPENDENT_AMBULATORY_CARE_PROVIDER_SITE_OTHER): Payer: No Typology Code available for payment source | Admitting: Adult Health

## 2020-10-05 DIAGNOSIS — M542 Cervicalgia: Secondary | ICD-10-CM

## 2020-10-05 DIAGNOSIS — M25511 Pain in right shoulder: Secondary | ICD-10-CM

## 2020-10-09 DIAGNOSIS — M542 Cervicalgia: Secondary | ICD-10-CM | POA: Insufficient documentation

## 2020-10-09 DIAGNOSIS — M25511 Pain in right shoulder: Secondary | ICD-10-CM | POA: Insufficient documentation

## 2020-10-10 ENCOUNTER — Telehealth: Payer: Self-pay

## 2020-10-10 ENCOUNTER — Other Ambulatory Visit: Payer: Self-pay | Admitting: Nurse Practitioner

## 2020-10-10 ENCOUNTER — Ambulatory Visit (INDEPENDENT_AMBULATORY_CARE_PROVIDER_SITE_OTHER): Payer: No Typology Code available for payment source

## 2020-10-10 DIAGNOSIS — M542 Cervicalgia: Secondary | ICD-10-CM

## 2020-10-10 DIAGNOSIS — M25511 Pain in right shoulder: Secondary | ICD-10-CM

## 2020-10-10 DIAGNOSIS — R55 Syncope and collapse: Secondary | ICD-10-CM | POA: Diagnosis not present

## 2020-10-10 LAB — CUP PACEART REMOTE DEVICE CHECK
Date Time Interrogation Session: 20220531155541
Implantable Pulse Generator Implant Date: 20220216

## 2020-10-10 NOTE — Progress Notes (Signed)
Patient to be notified that x-rays of her shoulder and neck show no acute abnormalities. Shoulder x-ray showed a possible calcified "loose Body" but this is something that would have been there for quite some time. Please tell her if pain is still severe, I can refer her to orthopedics. Thanks.

## 2020-10-10 NOTE — Progress Notes (Signed)
Order for referral to orthopedics placed in epic due to right shoulder pain which radiates into the neck. X-ray of shoulder showing possible small calcified loose body. X-ray of neck showing reversal of usual lordosis.

## 2020-10-10 NOTE — Telephone Encounter (Signed)
ILR alert received for incomplete download message.   [facility] shows 10/02/20 patient marked symptom event at 19:51 while riding in car, reports of chest pain for a short duration of time. Patient reports she has not had any further and been well throughout the week. Advised patient her rhythm did not show any arrythmias and was a normal rate. Advised patient I will forward to Dr. Lovena Le but do no for see any changes at this time. Patient appreciative for call.

## 2020-10-10 NOTE — Progress Notes (Signed)
Please let the patient know that x-rays of her shoulder and neck show no acute abnormalities. Shoulder x-ray showed a possible calcified "loose Body" but this is something that would have been there for quite some time. Please tell her if pain is still severe, I can refer her to orthopedics. Thanks.

## 2020-10-11 ENCOUNTER — Encounter: Payer: Self-pay | Admitting: Physician Assistant

## 2020-10-11 ENCOUNTER — Other Ambulatory Visit (HOSPITAL_COMMUNITY): Payer: Self-pay

## 2020-10-11 DIAGNOSIS — B379 Candidiasis, unspecified: Secondary | ICD-10-CM

## 2020-10-11 MED ORDER — FLUCONAZOLE 150 MG PO TABS
150.0000 mg | ORAL_TABLET | Freq: Once | ORAL | 0 refills | Status: AC
  Filled 2020-10-11: qty 1, 1d supply, fill #0

## 2020-10-12 NOTE — Progress Notes (Signed)
Cardiology Office Note:    Date:  10/14/2020   ID:  [name] [name], DOB Jun 01, 1980, MRN [id number]  PCP:  Lorrene Reid, PA-C  Cardiologist:  Buford Dresser, MD  Referring MD: Lorrene Reid, PA-C   CC: follow up  History of Present Illness:    [name] [name] is a 40 y.o. female with a hx of chronic dizziness/presyncope/syncope, PCOS, prior bariatric surgery, former tobacco use, personal history of Covid infection who is seen for follow up.   Cardiac workup: -30 day monitor from 04/2018 and more recent monitor from 11/2019. No significant pauses or arrhythmias. Symptoms did not correlate to abnormal heart rhythms. Normal heart rate variability, augments appropriately with activity.  -Orthostatics done 10/2019 with appropriate elevation in HR with standing without drop in blood pressure. -now has loop recorder, see below  Today: Lately, she is feeling about the same since her last visit. So far this month (10/2020) she notes having more good days than bad. She did have an episode of near-syncope with tunnel vision but was not fully syncopal. She is frustrated with not being able to pinpoint triggers of these episodes. Of note she also has correlating headaches.   We reviewed her loop recorder. She has previously noted symptoms with heart rates in the 30s and 40s, but her recent symptomatic episodes do not have significant bradycardia.  Every day she continues to drink gatorade and water, increase her salt intake, and wear compression socks. She had previously been active and has noted that trying to work out causes early symptoms. She is anxious to be able to return to her routine.   After taking Florinef for 3 days she became very nauseous and could not tolerate the medication.   She denies any chest pain, shortness of breath, palpitations, exertional symptoms, or lightheadedness. Also has no lower extremity edema, orthopnea or PND.   Past Medical History:  Diagnosis Date    Abnormal uterine bleeding    Amenorrhea    Anemia    Anxiety    panic attacks   Back pain    Bradycardia    Chest pain    Constipation    COVID 02/2020   Diabetes ([place])    Dysmenorrhea    Endometriosis    Gallbladder problem    GERD (gastroesophageal reflux disease)    occasional - diet controlled and tums prn   HLD (hyperlipidemia)    HTN (hypertension)    IBS (irritable bowel syndrome)    Infertility, female    Insulin resistance    Migraine without aura    Missed abortion    x 2 - both resolved without surgery   Obesity    PCOS (polycystic ovarian syndrome)    PCOS (polycystic ovarian syndrome)    PONV (postoperative nausea and vomiting)    pe rpatient ; scopalamine patch very helpful    Sleep apnea    SVD (spontaneous vaginal delivery)    x 1   Syncope and collapse 06/21/2020   Urinary incontinence    Vitamin D deficiency     Past Surgical History:  Procedure Laterality Date   CHOLECYSTECTOMY     COLONOSCOPY  2017   polyps   CYSTOSCOPY N/A 10/29/2016   Procedure: CYSTOSCOPY;  Surgeon: Salvadore Dom, MD;  Location: [facility];  Service: Gynecology;  Laterality: N/A;   DILATATION & CURRETTAGE/HYSTEROSCOPY WITH RESECTOCOPE N/A 10/17/2012   Procedure: DILATATION & CURETTAGE/HYSTEROSCOPY WITH RESECTOCOPE; Possible Polypectomy, Possible Resectoscopic Myomectomy.;  Surgeon: Floyce Stakes. Pamala Hurry, MD;  Location:  [facility];  Service: Gynecology;  Laterality: N/A;  1 hr.   DILATION AND CURETTAGE OF UTERUS     ENDOMETRIAL BIOPSY     ENDOVENOUS ABLATION SAPHENOUS VEIN W/ LASER Right 05/08/2018   endovenous laser ablation right greater saphenous vein and stab phlebectomy > 20 incisions right leg by Deitra Mayo MD    ESOPHAGOGASTRODUODENOSCOPY ENDOSCOPY  2017   Normal   HYSTEROSCOPY WITH D & C N/A 12/01/2013   Procedure: IUD Removal;  Surgeon: Floyce Stakes. Pamala Hurry, MD;  Location: [facility];  Service: Gynecology;  Laterality: N/A;  90 min.   INTRAUTERINE DEVICE (IUD) INSERTION  N/A 10/17/2012   Procedure: INTRAUTERINE DEVICE (IUD) INSERTION; Mirena;  Surgeon: Floyce Stakes. Pamala Hurry, MD;  Location: [facility];  Service: Gynecology;  Laterality: N/A;   LAPAROSCOPIC GASTRIC SLEEVE RESECTION N/A 02/19/2017   Procedure: LAPAROSCOPIC GASTRIC SLEEVE RESECTION, UPPER ENDOSCOPY;  Surgeon: Johnathan Hausen, MD;  Location: [facility];  Service: General;  Laterality: N/A;   LAPAROSCOPIC SALPINGO OOPHERECTOMY Left 10/29/2016   Procedure: LAPAROSCOPIC SALPINGO OOPHORECTOMY With anterior uterine biopsy;  Surgeon: Salvadore Dom, MD;  Location: [facility];  Service: Gynecology;  Laterality: Left;   LASIK  10/06/2018   LOOP RECORDER INSERTION N/A 06/22/2020   Procedure: LOOP RECORDER INSERTION;  Surgeon: Evans Lance, MD;  Location: [facility];  Service: Cardiovascular;  Laterality: N/A;   MOUTH SURGERY     wisdom teeth    Current Medications: Current Outpatient Medications on File Prior to Visit  Medication Sig   APPLE CIDER VINEGAR PO Take 1 tablet by mouth daily.   ascorbic acid (VITAMIN C) 500 MG tablet Take 500 mg by mouth daily.   Biotin w/ Vitamins C & E (HAIR/SKIN/NAILS PO) Take 1 tablet by mouth daily.    Chromium Picolinate 200 MCG CAPS Take 1 capsule by mouth daily.   clonazePAM (KLONOPIN) 0.5 MG tablet Take 1 tablet (0.5 mg total) by mouth 2 (two) times daily as needed for anxiety.   Coenzyme Q10 (COQ10) 100 MG CAPS Take 1 capsule by mouth daily.   cyclobenzaprine (FLEXERIL) 10 MG tablet Take 1 tablet (10 mg total) by mouth 2 (two) times daily as needed for muscle spasms.   Docosahexaenoic Acid (DHA COMPLETE PO) Take 1 tablet by mouth daily.   fludrocortisone (FLORINEF) 0.1 MG tablet Take 1 tablet (0.1 mg total) by mouth 2 (two) times daily.   influenza vac split quadrivalent PF (FLUARIX) 0.5 ML injection TO BE ADMINISTERED BY PHARMACIST (Patient taking differently: TO BE ADMINISTERED BY PHARMACIST)   levonorgestrel (MIRENA) 20 MCG/24HR IUD 1 each by Intrauterine route  once.   loratadine (CLARITIN) 10 MG tablet Take 10 mg by mouth daily as needed for allergies.   metFORMIN (GLUCOPHAGE) 500 MG tablet TAKE 1 TABLET BY MOUTH EVERY MORNING.   Multiple Vitamins-Minerals (BARIATRIC MULTIVITAMINS/IRON PO) Take 1 tablet by mouth in the morning and at bedtime.   nystatin cream (MYCOSTATIN) Apply 1 application topically 2 (two) times daily. Apply to affected area BID for up to 7 days.   Probiotic Product (PROBIOTIC ADVANCED PO) Take 1 capsule by mouth daily.    Spirulina 500 MG TABS Take 1 tablet by mouth daily.   SUMAtriptan (IMITREX) 50 MG tablet TAKE 1 TABLET BY MOUTH AT ONSET OF MIGRAINE, MAY REPEAT IN 2 HOURS IF HEADACHE PERSISTS   traMADol (ULTRAM) 50 MG tablet Take 1 tablet (50 mg total) by mouth every 8 (eight) hours as needed.   Vitamin D, Ergocalciferol, (DRISDOL) 1.25 MG (50000  UNIT) CAPS capsule TAKE 1 CAPSULE BY MOUTH EVERY 7 DAYS   No current facility-administered medications on file prior to visit.     Allergies:   Iohexol and Pyridium [phenazopyridine hcl]   Social History   Tobacco Use   Smoking status: Former    Packs/day: 0.25    Years: 19.00    Pack years: 4.75    Types: Cigarettes    Quit date: 06/06/2016    Years since quitting: 4.3   Smokeless tobacco: Never  Vaping Use   Vaping Use: Never used  Substance Use Topics   Alcohol use: Yes    Comment: 1 a month   Drug use: No    Family History: family history includes Alcohol abuse in her paternal grandfather; Colon cancer in her maternal uncle; Dementia in her father; Depression in her mother; Diabetes in her father, maternal grandfather, maternal grandmother, mother, paternal grandfather, and paternal grandmother; Heart disease in her father, maternal grandfather, maternal grandmother, and paternal grandfather; Hyperlipidemia in her father; Hypertension in her father and mother; Obesity in her father and mother; Parkinson's disease in her father; Sleep apnea in her father; Stroke in her  maternal grandfather; Thyroid disease in her father. Family history: multiple family members with heart attacks and stents. Mat Gpa had bypass surgery and a pacemaker. Father has heart issues, had cath recently but no stent. Mother has mitral valve prolapse and palpitations. Gena Fray, paternal great-gpa, and uncle all had MI.   ROS:   Please see the history of present illness.   (+) Near-Syncope (+) Headaches Additional pertinent ROS otherwise unremarkable.  EKGs/Labs/Other Studies Reviewed:    The following studies were reviewed today:  [place] LE Venous 08/26/2020: Summary:  Right:  - No evidence of deep vein thrombosis from the common femoral through the  popliteal veins.  - No evidence of superficial venous thrombosis.  - The common femoral vein is not competent.  - The great saphenous vein is not competent at the knee and proximal calf.  - The small saphenous vein is competent.     Left:  - No evidence of deep vein thrombosis from the common femoral through the  popliteal veins.  - No evidence of superficial venous thrombosis.  - The deep venous system is not competent.  - The great saphenous vein is ablated proximally and competent distally.  - The small saphenous vein is not competent.  Echo 06/22/2020: 1. Left ventricular ejection fraction, by estimation, is 60 to 65%. The  left ventricle has normal function. The left ventricle has no regional  wall motion abnormalities. There is mild left ventricular hypertrophy.  Left ventricular diastolic parameters  were normal.   2. Right ventricular systolic function is normal. The right ventricular  size is normal. Tricuspid regurgitation signal is inadequate for assessing  PA pressure.   3. The mitral valve is normal in structure. No evidence of mitral valve  regurgitation.   4. The aortic valve was not well visualized. Aortic valve regurgitation  is not visualized. No aortic stenosis is present.   5. The inferior vena cava is  normal in size with greater than 50%  respiratory variability, suggesting right atrial pressure of 3 mmHg.  Loop Recorder Insertion 06/22/2020: CONCLUSIONS:   1. Successful implantation of a [employer] Reveal LINQ implantable loop recorder for unexplained syncope  2. No early apparent complications.   Monitor 11/23/2019: 12 days of data recorded on Zio monitor. Patient had a min HR of 37 bpm, max HR of 180  bpm, and avg HR of 67 bpm. Predominant underlying rhythm was Sinus Rhythm. No VT, SVT, atrial fibrillation, high degree block, or pauses noted. Isolated atrial and ventricular ectopy was rare (<1%). There were 91 triggered events. These were all sinus, with occasional isolate PAC or PVC. No significant arrhythmias detected.   Monitor 04/21/18 NSR Rare PAC/PVC s  Symptoms do not correlate with arrhythmia Average HR 69 bpm  CT coronary 03/23/18 Coronary Arteries:  Normal coronary origin.  Right dominance.   RCA is a large dominant artery that gives rise to PDA and PLVB. There is no plaque.   Left main is a large artery that gives rise to LAD and LCX arteries. Left main has no plaque.   LAD is a large vessel that gives rise to two diagonal arteries. Proximal LAD has mild calcified plaque with associated stenosis 0-25%. Mid LAD has a mild non-calcified plaque with associated stenosis 0-25%.   LCX is a small non-dominant artery that gives rise to one small OM1 branch. There is no plaque.   Other findings:   Normal pulmonary vein drainage into the left atrium.   Normal let atrial appendage without a thrombus.   IMPRESSION: 1. Coronary calcium score of 46. This was 27 percentile for age and sex matched control.   2. Normal coronary origin with right dominance.   3. 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 Aggressive medical management is recommended.   4. Mildly dilated pulmonary artery measuring 32 mm suggestive of pulmonary hypertension.    EKG:  EKG is  personally reviewed.    06/10/2020: sinus bradycardia at 49 bpm  Recent Labs: 06/21/2020: Magnesium 2.0 09/23/2020: ALT 12; BUN 13; Creatinine, Ser 0.90; Hemoglobin 14.4; Platelets 319; Potassium 4.1; Sodium 140; TSH 1.000  Recent Lipid Panel    Component Value Date/Time   CHOL 205 (H) 08/18/2020 1015   TRIG 65 08/18/2020 1015   HDL 49 08/18/2020 1015   CHOLHDL 4.2 08/18/2020 1015   CHOLHDL 3.5 03/23/2018 0623   VLDL 8 03/23/2018 0623   LDLCALC 144 (H) 08/18/2020 1015   LDLDIRECT 141 (H) 09/23/2020 1130    Physical Exam:    VS:  BP 102/74 (BP Location: Left Arm, Patient Position: Sitting, Cuff Size: Large)   Pulse 74   Ht 5\' 8"  (1.727 m)   Wt 238 lb 12.8 oz (108.3 kg)   SpO2 94%   BMI 36.31 kg/m     Wt Readings from Last 3 Encounters:  10/14/20 238 lb 12.8 oz (108.3 kg)  10/04/20 239 lb 3.2 oz (108.5 kg)  09/27/20 236 lb (107 kg)    GEN: Well nourished, well developed in no acute distress HEENT: Normal, moist mucous membranes NECK: No JVD CARDIAC: regular rhythm, normal S1 and S2, no rubs or gallops. No murmur. VASCULAR: Radial and DP pulses 2+ bilaterally. No carotid bruits RESPIRATORY:  Clear to auscultation without rales, wheezing or rhonchi  ABDOMEN: Soft, non-tender, non-distended MUSCULOSKELETAL:  Ambulates independently SKIN: Warm and dry, no edema NEUROLOGIC:  Alert and oriented x 3. No focal neuro deficits noted. PSYCHIATRIC:  Normal affect   ASSESSMENT:    1. Near syncope   2. Nonocclusive coronary atherosclerosis of native coronary artery   3. Cardiac risk counseling   4. Counseling on health promotion and disease prevention    PLAN:    Near syncope Most recent loop report labeled as incomplete, no high risk episodes noted. Prior loop report notes, "11 Symptom activation events all appear SR.  No  new tachy, brady, or pauseepisodes. No new AF episodes. Monthly summary reports and ROV/PRN." Did not tolerate florinef, felt very nauseated. Doing well with  fluids, salt, compression stockings. We did discuss getting back to exercise today.    Near syncope, bradycardia, palpitations: -not orthostatic on vitals. Monitor without arrhythmia or conduction issues -she is s/p loop recorder implant. Review shows no significant bradycardia or arrhythmia with symptoms -suspect this is at least partially vagal/autonomic, though orthostatics are unrevealing.  -Postural Orthostatic Tachycardia Syndrome: Spent >20 minutes counseling specifically on the etiology, diagnosis, and symptom management. The following recommendations were emphasized: -avoid dehydration. Often it requires high volumes of fluids, often with salt/electrolytes included, to stay hydrated. Some people are very sensitive to fluid shifts and dehydration. Oral rehydration is preferred, and routine use of IV fluids is not recommended. -if tolerated, compression stocking can assist with fluid management and prevent pooling in the legs. -slow position changes are recommended -if there is a feeling of severe lightheadedness, like near to passing out, recommend lying on the floor on the back, with legs elevated up on a chair or up against the wall. -the best long term management is gradual exercise conditioning. I recommend seated exercises such as bike to start, to avoid the risk of falling with lightheadedness. Exercise programs, either through supervised programs like cardiac rehab or through personal programs, should focus on gradually increasing exercise tolerance and conditioning.  -we discussed the typical spectrum of dysautonomia, including typical populations, that this sometimes spontaneously improves with age (though a small percentage have persistent symptoms), that this has uncomfortable symptoms but is not associated with long term mortality, and that the etiology/treatment of this is an area of active research  -I have completed her [employer] paperwork today. Stressed that these are unpredictable  in terms of frequency. She appears to be having about 3 severe episodes/month, but she may have more or less in a given month  nonobstructive CAD -prior chest pain unlikely cardiac in origin -counseled on red flag warning signs that need immediate medical attention -continue to discuss prevention, below  Cardiac risk counseling and prevention recommendations: especially with family history of heart disease -recommend heart healthy/Mediterranean diet, with whole grains, fruits, vegetable, fish, lean meats, nuts, and olive oil. -recommend moderate walking, 3-5 times/week for 30-50 minutes each session. Aim for at least 150 minutes.week. Goal should be pace of 3 miles/hours, or walking 1.5 miles in 30 minutes -recommend avoidance of tobacco products. Avoid excess alcohol. -ASCVD risk score: The ASCVD Risk score Mikey Bussing DC Jr., et al., 2013) failed to calculate for the following reasons:   The 2013 ASCVD risk score is only valid for ages 29 to 51    Plan for follow up: 3 months.  Buford Dresser, MD, PhD, [facility]   Medication Adjustments/Labs and Tests Ordered: Current medicines are reviewed at length with the patient today.  Concerns regarding medicines are outlined above.  No orders of the defined types were placed in this encounter.  No orders of the defined types were placed in this encounter.   Patient Instructions  Medication Instructions:  Your Physician recommend you continue on your current medication as directed.    *If you need a refill on your cardiac medications before your next appointment, please call your pharmacy*   Lab Work: None ordered today   Testing/Procedures: None ordered today   Follow-Up: At [facility], you and your health needs are our priority.  As part of our continuing mission  to provide you with exceptional heart care, we have created designated Provider Care Teams.  These Care Teams include your primary  Cardiologist (physician) and Advanced Practice Providers (APPs -  Physician Assistants and Nurse Practitioners) who all work together to provide you with the care you need, when you need it.  We recommend signing up for the patient portal called "MyChart".  Sign up information is provided on this After Visit Summary.  MyChart is used to connect with patients for Virtual Visits (Telemedicine).  Patients are able to view lab/test results, encounter notes, upcoming appointments, etc.  Non-urgent messages can be sent to your provider as well.   To learn more about what you can do with MyChart, go to [email or web address].    Your next appointment:   3 month(s) @ [address] [place], [place] [address]   The format for your next appointment:   In Person  Provider:   Buford Dresser, MD    [facility] Stumpf,acting as a scribe for Buford Dresser, MD.,have documented all relevant documentation on the behalf of Buford Dresser, MD,as directed by  Buford Dresser, MD while in the presence of Buford Dresser, MD.  I, Buford Dresser, MD, have reviewed all documentation for this visit. The documentation on 10/14/20 for the exam, diagnosis, procedures, and orders are all accurate and complete.   Signed, Buford Dresser, MD PhD 10/14/2020  [place]

## 2020-10-13 ENCOUNTER — Other Ambulatory Visit: Payer: Self-pay

## 2020-10-13 ENCOUNTER — Telehealth: Payer: Self-pay | Admitting: Cardiology

## 2020-10-13 NOTE — Telephone Encounter (Signed)
[facility] received forms to be updated for [employer]. Forms were given to Dr.Christopher Nurse on 6/9.

## 2020-10-14 ENCOUNTER — Ambulatory Visit (INDEPENDENT_AMBULATORY_CARE_PROVIDER_SITE_OTHER): Payer: No Typology Code available for payment source | Admitting: Cardiology

## 2020-10-14 ENCOUNTER — Encounter: Payer: Self-pay | Admitting: Cardiology

## 2020-10-14 VITALS — BP 102/74 | HR 74 | Ht 68.0 in | Wt 238.8 lb

## 2020-10-14 DIAGNOSIS — R55 Syncope and collapse: Secondary | ICD-10-CM | POA: Diagnosis not present

## 2020-10-14 DIAGNOSIS — I2510 Atherosclerotic heart disease of native coronary artery without angina pectoris: Secondary | ICD-10-CM | POA: Diagnosis not present

## 2020-10-14 DIAGNOSIS — Z7189 Other specified counseling: Secondary | ICD-10-CM

## 2020-10-14 NOTE — Telephone Encounter (Signed)
Updated forms complete

## 2020-10-14 NOTE — Patient Instructions (Signed)
Medication Instructions:  Your Physician recommend you continue on your current medication as directed.    *If you need a refill on your cardiac medications before your next appointment, please call your pharmacy*   Lab Work: None ordered today   Testing/Procedures: None ordered today   Follow-Up: At [facility], you and your health needs are our priority.  As part of our continuing mission to provide you with exceptional heart care, we have created designated Provider Care Teams.  These Care Teams include your primary Cardiologist (physician) and Advanced Practice Providers (APPs -  Physician Assistants and Nurse Practitioners) who all work together to provide you with the care you need, when you need it.  We recommend signing up for the patient portal called "MyChart".  Sign up information is provided on this After Visit Summary.  MyChart is used to connect with patients for Virtual Visits (Telemedicine).  Patients are able to view lab/test results, encounter notes, upcoming appointments, etc.  Non-urgent messages can be sent to your provider as well.   To learn more about what you can do with MyChart, go to [email or web address].    Your next appointment:   3 month(s) @ [address] [place], [place] [address]   The format for your next appointment:   In Person  Provider:   Bridgette Christopher, MD     

## 2020-10-14 NOTE — Assessment & Plan Note (Addendum)
Most recent loop report labeled as incomplete, no high risk episodes noted. Prior loop report notes, "11 Symptom activation events all appear SR. No new tachy, brady, or pauseepisodes. No new AF episodes. Monthly summary reports and ROV/PRN." Did not tolerate florinef, felt very nauseated. Doing well with fluids, salt, compression stockings. We did discuss getting back to exercise today.

## 2020-10-18 IMAGING — DX DG WRIST COMPLETE 3+V*R*
4 series · 4 of 4 positions shown · non-contrast
Comparison: None.

CLINICAL DATA: Right wrist pain after fall yesterday.

EXAM:
RIGHT WRIST - COMPLETE 3+ VIEW

[wrist pa]
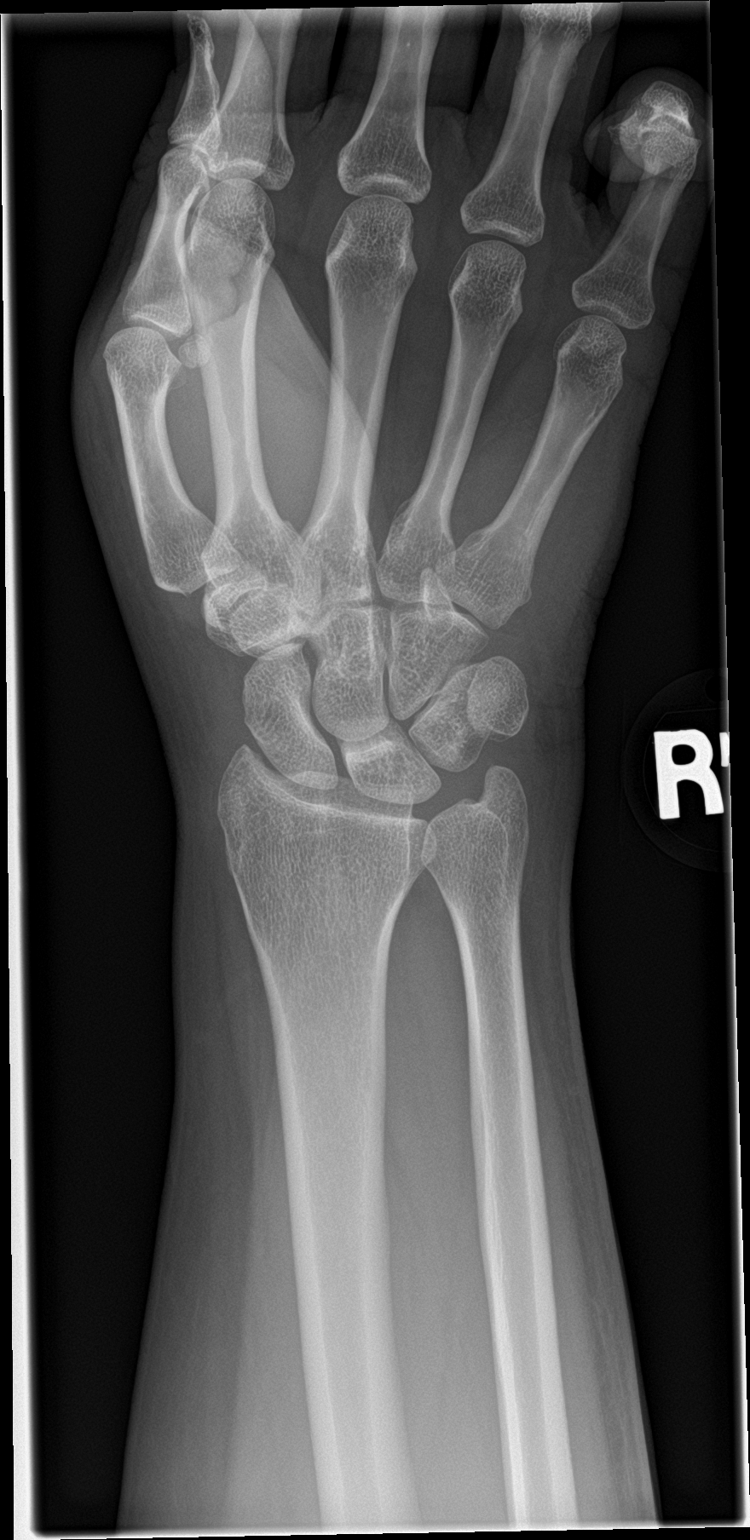

[wrist obl]
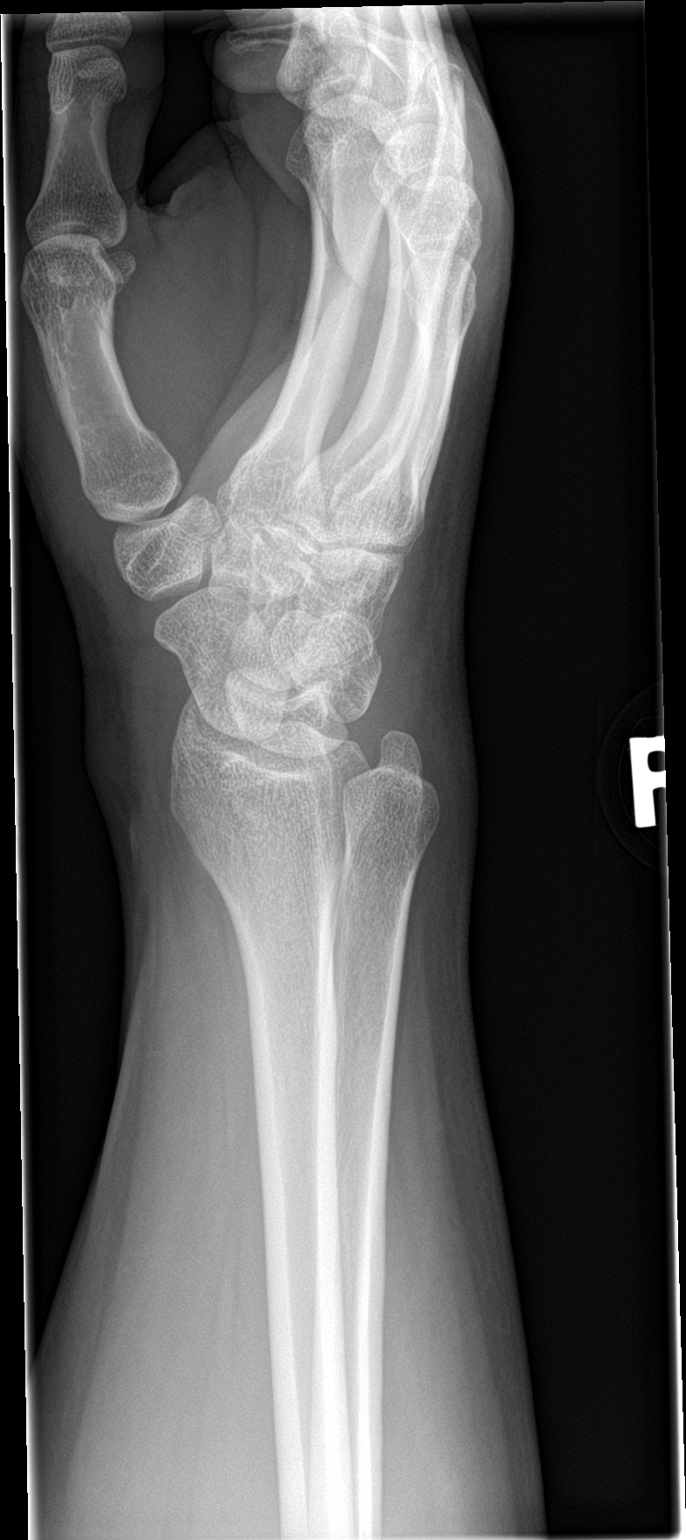

[wrist lat]
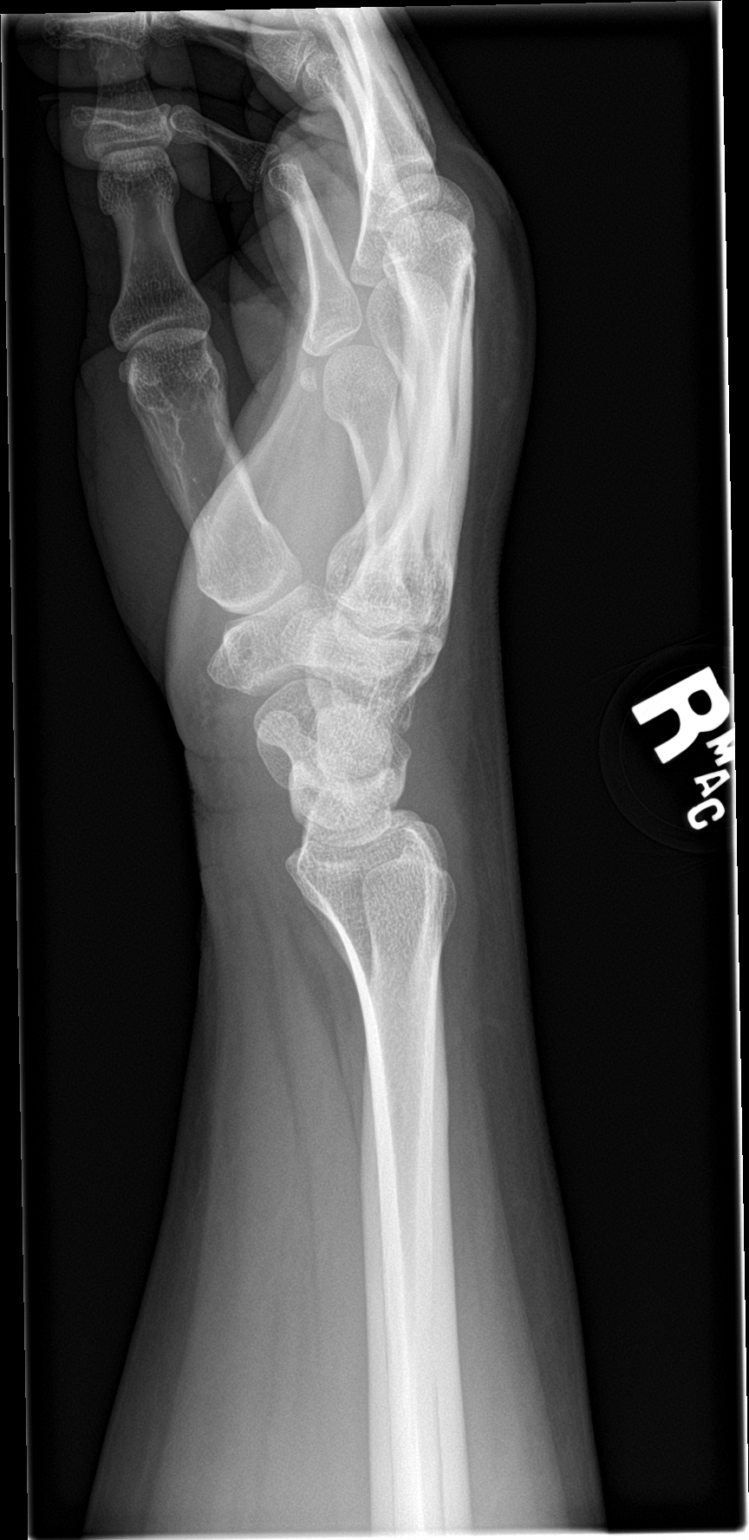

[wrist navicular]
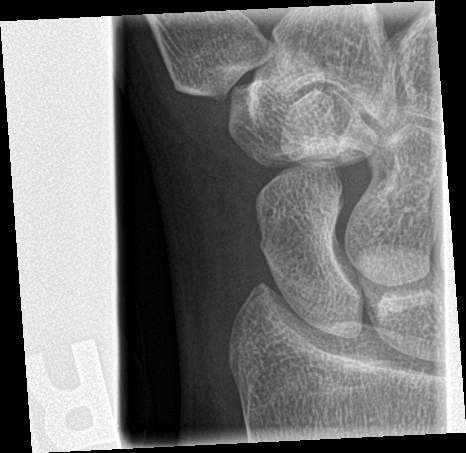

[4 of 4 positions shown; findings below may reference images not displayed]

FINDINGS: There is no evidence of fracture or dislocation. The scaphoid is
intact. There is no evidence of arthropathy or other focal bone
abnormality. Soft tissues are unremarkable.
IMPRESSION: Negative radiographs of the right wrist.

## 2020-10-21 ENCOUNTER — Ambulatory Visit: Payer: No Typology Code available for payment source | Admitting: Orthopaedic Surgery

## 2020-11-01 NOTE — Progress Notes (Signed)
[facility] Summary Report / Loop Recorder 

## 2020-11-02 ENCOUNTER — Ambulatory Visit
Admission: RE | Admit: 2020-11-02 | Discharge: 2020-11-02 | Disposition: A | Discharge status: Home / Self Care | Payer: No Typology Code available for payment source | Source: Ambulatory Visit | Attending: Physician Assistant | Admitting: Physician Assistant

## 2020-11-02 ENCOUNTER — Encounter (INDEPENDENT_AMBULATORY_CARE_PROVIDER_SITE_OTHER): Payer: Self-pay | Admitting: Family Medicine

## 2020-11-02 ENCOUNTER — Other Ambulatory Visit: Payer: Self-pay

## 2020-11-02 ENCOUNTER — Ambulatory Visit (INDEPENDENT_AMBULATORY_CARE_PROVIDER_SITE_OTHER): Payer: No Typology Code available for payment source | Admitting: Family Medicine

## 2020-11-02 ENCOUNTER — Other Ambulatory Visit: Payer: No Typology Code available for payment source

## 2020-11-02 VITALS — BP 119/73 | HR 55 | Temp 98.8°F | Ht 68.0 in | Wt 231.0 lb

## 2020-11-02 DIAGNOSIS — Z9189 Other specified personal risk factors, not elsewhere classified: Secondary | ICD-10-CM

## 2020-11-02 DIAGNOSIS — Z6837 Body mass index (BMI) 37.0-37.9, adult: Secondary | ICD-10-CM

## 2020-11-02 DIAGNOSIS — Z9884 Bariatric surgery status: Secondary | ICD-10-CM | POA: Diagnosis not present

## 2020-11-02 DIAGNOSIS — Z872 Personal history of diseases of the skin and subcutaneous tissue: Secondary | ICD-10-CM

## 2020-11-02 DIAGNOSIS — E282 Polycystic ovarian syndrome: Secondary | ICD-10-CM

## 2020-11-02 DIAGNOSIS — G909 Disorder of the autonomic nervous system, unspecified: Secondary | ICD-10-CM

## 2020-11-02 DIAGNOSIS — N63 Unspecified lump in unspecified breast: Secondary | ICD-10-CM

## 2020-11-02 NOTE — Progress Notes (Signed)
Chief Complaint:   OBESITY [name] [name] is here to discuss her progress with her obesity treatment plan along with follow-up of her obesity related diagnoses. See Medical Weight Management Flowsheet for complete bioelectrical impedance results.  Today's visit was #: 47 Starting weight: 243 lbs Starting date: 06/18/2019 Today's weight: 231 lbs Today's date: 11/02/2020 Weight change since last visit: 5 lbs Total lbs lost to date: 12 lbs Body mass index is 35.12 kg/m.  Total weight loss percentage to date: -4.94%  Interim History:  [name] [name] has an appointment with Cardiology on 12/02/2020.  She says that [name] [name] made her nauseated. Nutrition Plan: the Category 1 Plan. Activity:  Cardio for 120 minutes.  Assessment/Plan:   1. PCOS (polycystic ovarian syndrome) She will continue to focus on protein-rich, low simple carbohydrate foods. We reviewed the importance of hydration, regular exercise for stress reduction, and restorative sleep. Medication: metformin 500 mg daily.  Counseling PCOS is a leading cause of menstrual irregularities and infertility. It is also associated with obesity, hirsutism (excessive hair growth on the face, chest, or back), and cardiovascular risk factors such as high cholesterol and insulin resistance. Insulin resistance appears to play a central role.  Women with PCOS have been shown to have impaired appetite-regulating hormones. Metformin is one medication that can improve metabolic parameters.  Women with polycystic ovary syndrome (PCOS) have an increased risk for cardiovascular disease (CVD) - European Journal of Preventive Cardiology.  2. Autonomic dysfunction Stable. Reviewed recent cardiac work-up. [name] [name] made her nauseous. Discussed orthostatic intolerance/ autonomic dysfunction related to bariatric surgery.  3. History of bariatric surgery [name] [name] is at risk for malnutrition due to her previous bariatric surgery.   Counseling You may need to eat 3  meals and 2 snacks, or 5 small meals each day in order to reach your protein and calorie goals.  Allow at least 15 minutes for each meal so that you can eat mindfully. Listen to your body so that you do not overeat. For most people, your sleeve or pouch will comfortably hold 4-6 ounces. Eat foods from all food groups. This includes fruits and vegetables, grains, dairy, and meat and other proteins. Include a protein-rich food at every meal and snack, and eat the protein food first.  You should be taking a Bariatric Multivitamin as well as calcium.   4. At risk for impaired metabolic function Due to [name] [name]'s current state of health and medical condition(s), she is at a significantly higher risk for impaired metabolic function.   At least 8 minutes was spent on counseling [name] [name] about these concerns today.  This places the patient at a much greater risk to subsequently develop cardio-pulmonary conditions that can negatively affect the patient's quality of life.  I stressed the importance of reversing these risks factors.    5. Obesity, current BMI 35.2  Course: [name] [name] is currently in the action stage of change. As such, her goal is to continue with weight loss efforts.   Nutrition goals: She has agreed to the Category 1 Plan.   Exercise goals:  As is.  Behavioral modification strategies: increasing lean protein intake, decreasing simple carbohydrates, increasing vegetables, and increasing water intake.  [name] [name] has agreed to follow-up with our clinic in 4 weeks. She was informed of the importance of frequent follow-up visits to maximize her success with intensive lifestyle modifications for her multiple health conditions.   Objective:   Blood pressure 119/73, pulse (!) 55, temperature 98.8 F (37.1 C), temperature source Oral, height 5\' 8"  (1.727  m), weight 231 lb (104.8 kg), SpO2 98 %. Body mass index is 35.12 kg/m.  General: Cooperative, alert, well developed, in no acute  distress. HEENT: Conjunctivae and lids unremarkable. Cardiovascular: Regular rhythm.  Lungs: Normal work of breathing. Neurologic: No focal deficits.   Lab Results  Component Value Date   CREATININE 0.90 09/23/2020   BUN 13 09/23/2020   NA 140 09/23/2020   K 4.1 09/23/2020   CL 103 09/23/2020   CO2 21 09/23/2020   Lab Results  Component Value Date   ALT 12 09/23/2020   AST 14 09/23/2020   ALKPHOS 71 09/23/2020   BILITOT 0.3 09/23/2020   Lab Results  Component Value Date   HGBA1C 5.7 (H) 09/23/2020   HGBA1C 5.8 (H) 08/18/2020   HGBA1C 5.6 10/14/2019   HGBA1C 5.7 (H) 06/18/2019   HGBA1C 5.3 01/08/2019   Lab Results  Component Value Date   INSULIN 11.0 08/18/2020   INSULIN 11.9 10/14/2019   INSULIN 10.1 06/18/2019   Lab Results  Component Value Date   TSH 1.000 09/23/2020   Lab Results  Component Value Date   CHOL 205 (H) 08/18/2020   HDL 49 08/18/2020   LDLCALC 144 (H) 08/18/2020   LDLDIRECT 141 (H) 09/23/2020   TRIG 65 08/18/2020   CHOLHDL 4.2 08/18/2020   Lab Results  Component Value Date   VD25OH 48.6 08/18/2020   VD25OH 43.7 10/14/2019   VD25OH 33.3 06/18/2019   Lab Results  Component Value Date   WBC 9.2 09/23/2020   HGB 14.4 09/23/2020   HCT 43.8 09/23/2020   MCV 89 09/23/2020   PLT 319 09/23/2020   Lab Results  Component Value Date   FERRITIN 9 (L) 03/14/2016   Attestation Statements:   Reviewed by clinician on day of visit: allergies, medications, problem list, medical history, surgical history, family history, social history, and previous encounter notes.  I, Water quality scientist, CMA, am acting as transcriptionist for Briscoe Deutscher, DO  I have reviewed the above documentation for accuracy and completeness, and I agree with the above. Briscoe Deutscher, DO

## 2020-11-10 ENCOUNTER — Other Ambulatory Visit (INDEPENDENT_AMBULATORY_CARE_PROVIDER_SITE_OTHER): Payer: Self-pay | Admitting: Family Medicine

## 2020-11-10 ENCOUNTER — Other Ambulatory Visit (HOSPITAL_COMMUNITY): Payer: Self-pay

## 2020-11-10 DIAGNOSIS — B9689 Other specified bacterial agents as the cause of diseases classified elsewhere: Secondary | ICD-10-CM

## 2020-11-10 MED ORDER — METRONIDAZOLE 500 MG PO TABS
500.0000 mg | ORAL_TABLET | Freq: Two times a day (BID) | ORAL | 0 refills | Status: AC
  Filled 2020-11-10: qty 14, 7d supply, fill #0

## 2020-11-10 MED ORDER — FLUCONAZOLE 150 MG PO TABS
150.0000 mg | ORAL_TABLET | Freq: Once | ORAL | 0 refills | Status: DC
  Filled 2020-11-10: qty 2, 3d supply, fill #0

## 2020-11-14 ENCOUNTER — Ambulatory Visit (INDEPENDENT_AMBULATORY_CARE_PROVIDER_SITE_OTHER): Payer: No Typology Code available for payment source

## 2020-11-14 DIAGNOSIS — R55 Syncope and collapse: Secondary | ICD-10-CM | POA: Diagnosis not present

## 2020-11-14 LAB — CUP PACEART REMOTE DEVICE CHECK
Date Time Interrogation Session: 20220703155329
Implantable Pulse Generator Implant Date: 20220216

## 2020-11-23 ENCOUNTER — Encounter (HOSPITAL_BASED_OUTPATIENT_CLINIC_OR_DEPARTMENT_OTHER): Payer: Self-pay

## 2020-11-23 ENCOUNTER — Ambulatory Visit (INDEPENDENT_AMBULATORY_CARE_PROVIDER_SITE_OTHER): Payer: No Typology Code available for payment source | Admitting: Family Medicine

## 2020-11-25 ENCOUNTER — Telehealth: Payer: Self-pay | Admitting: Cardiology

## 2020-11-25 NOTE — Telephone Encounter (Signed)
This RN called patient back, pt reports yesterday (7/21) and 7/19 she had episodes of syncope. Per Dr. Judeth Cornfield 6/10 note, this is a chronic issue for the pt. Pt reports she has had symptoms of dizziness and lightheadedness, after which she sits down and then loses consciousness. Pt reports these episodes were witnessed by her son and she does not take a fall when they occur. Today pt's blood pressure is 115/72 with a heart rate of 49. Pt denies chest pain, shortness of breath, facial droop, one sided weakness, or slurred speech. Pt reports she has been under increased stress due to separating from her spouse and financial difficulties. This RN made patient an appointment to see Laurann Montana, NP on August 2 at 8am. Pt also instructed to have someone drive her to the emergency room or call 911 if another episode occurs. Patient verbalized understanding. Plan reviewed by DOD Dr. Gwenlyn Found who was in agreement.   Pt would also like to discuss leave from work with Dr. Harrell Gave, she reports she has had to miss several days of work due to her episodes and would like to speak to Dr. Harrell Gave about FMLA. This RN advised the patient's concerns regarding her episodes of syncope and FMLA would be forwarded to Dr. Harrell Gave and her primary nurse for review.

## 2020-11-25 NOTE — Telephone Encounter (Signed)
Pt c/o Syncope: STAT if syncope occurred within 30 minutes and pt complains of lightheadedness High Priority if episode of passing out, completely, today or in last 24 hours   Did you pass out today?  No, pt last passed out on 11/22/20 and 11/24/20  When is the last time you passed out?  pt last passed out on 11/22/20 and 11/24/20  Has this occurred multiple times? Yes 2X this week  Did you have any symptoms prior to passing out? Dizziness, lightheadedness, weakness, headache Pt sent a message on MyChart on 11/23/20 and no one has replied to the My chart message.

## 2020-11-30 ENCOUNTER — Ambulatory Visit (INDEPENDENT_AMBULATORY_CARE_PROVIDER_SITE_OTHER): Payer: No Typology Code available for payment source | Admitting: Adult Health

## 2020-12-01 NOTE — Telephone Encounter (Signed)
Called pt to inform Dr. Harrell Gave is currently out of the office and will review previous note once she return. Pt also made aware of ED precaution should any new symptoms develop or worsen. Pt verbalized understanding.

## 2020-12-01 NOTE — Telephone Encounter (Signed)
Please see updated encounter.  Pt has an appointment scheduled for 8/2 with Laurann Montana, NP for further evaluations.

## 2020-12-05 NOTE — Progress Notes (Signed)
Office Visit    Patient Name: [name] [name] Date of Encounter: 12/06/2020  PCP:  Lorrene Reid, [place]  Cardiologist:  Buford Dresser, MD  Advanced Practice Provider:  No care team member to display Electrophysiologist:  None    Chief Complaint    [name] [name] is a 40 y.o. female with a hx of chronic dizziness/presyncope/syncope, PCOS, prior bariatric surgery, former tobacco use, COVID-19, Nonobstructive CAD presents today for syncope  Past Medical History    Past Medical History:  Diagnosis Date   Abnormal uterine bleeding    Amenorrhea    Anemia    Anxiety    panic attacks   Back pain    Bradycardia    Chest pain    Constipation    COVID 02/2020   Diabetes ([place])    Dysmenorrhea    Endometriosis    Gallbladder problem    GERD (gastroesophageal reflux disease)    occasional - diet controlled and tums prn   HLD (hyperlipidemia)    HTN (hypertension)    IBS (irritable bowel syndrome)    Infertility, female    Insulin resistance    Migraine without aura    Missed abortion    x 2 - both resolved without surgery   Obesity    PCOS (polycystic ovarian syndrome)    PCOS (polycystic ovarian syndrome)    PONV (postoperative nausea and vomiting)    pe rpatient ; scopalamine patch very helpful    Sleep apnea    SVD (spontaneous vaginal delivery)    x 1   Syncope and collapse 06/21/2020   Urinary incontinence    Vitamin D deficiency    Past Surgical History:  Procedure Laterality Date   CHOLECYSTECTOMY     COLONOSCOPY  2017   polyps   CYSTOSCOPY N/A 10/29/2016   Procedure: CYSTOSCOPY;  Surgeon: Salvadore Dom, MD;  Location: [facility];  Service: Gynecology;  Laterality: N/A;   DILATATION & CURRETTAGE/HYSTEROSCOPY WITH RESECTOCOPE N/A 10/17/2012   Procedure: DILATATION & CURETTAGE/HYSTEROSCOPY WITH RESECTOCOPE; Possible Polypectomy, Possible Resectoscopic Myomectomy.;  Surgeon: Floyce Stakes. Pamala Hurry, MD;  Location: [facility];  Service: Gynecology;  Laterality: N/A;  1 hr.   DILATION AND CURETTAGE OF UTERUS     ENDOMETRIAL BIOPSY     ENDOVENOUS ABLATION SAPHENOUS VEIN W/ LASER Right 05/08/2018   endovenous laser ablation right greater saphenous vein and stab phlebectomy > 20 incisions right leg by Deitra Mayo MD    ESOPHAGOGASTRODUODENOSCOPY ENDOSCOPY  2017   Normal   HYSTEROSCOPY WITH D & C N/A 12/01/2013   Procedure: IUD Removal;  Surgeon: Floyce Stakes. Pamala Hurry, MD;  Location: [facility];  Service: Gynecology;  Laterality: N/A;  90 min.   INTRAUTERINE DEVICE (IUD) INSERTION N/A 10/17/2012   Procedure: INTRAUTERINE DEVICE (IUD) INSERTION; Mirena;  Surgeon: Floyce Stakes. Pamala Hurry, MD;  Location: [facility];  Service: Gynecology;  Laterality: N/A;   LAPAROSCOPIC GASTRIC SLEEVE RESECTION N/A 02/19/2017   Procedure: LAPAROSCOPIC GASTRIC SLEEVE RESECTION, UPPER ENDOSCOPY;  Surgeon: Johnathan Hausen, MD;  Location: [facility];  Service: General;  Laterality: N/A;   LAPAROSCOPIC SALPINGO OOPHERECTOMY Left 10/29/2016   Procedure: LAPAROSCOPIC SALPINGO OOPHORECTOMY With anterior uterine biopsy;  Surgeon: Salvadore Dom, MD;  Location: [facility];  Service: Gynecology;  Laterality: Left;   LASIK  10/06/2018   LOOP RECORDER INSERTION N/A 06/22/2020   Procedure: LOOP RECORDER INSERTION;  Surgeon: Evans Lance, MD;  Location: [facility];  Service: Cardiovascular;  Laterality: N/A;   MOUTH SURGERY     wisdom teeth    Allergies  Allergies  Allergen Reactions   Iohexol Hives     Code: HIVES, Desc: *NEED 13 HR PREDISONE AND BENADRYL  BE FOR IV CONTRAST GIVEN* /DR OLSON PT DEVELOPED HIVES AND ROOF OF MOUTH BEGIN "ITCHING"", Onset Date: [id number]    Pyridium [Phenazopyridine Hcl] Other (See Comments)    vaginitis symptoms when on it    History of Present Illness    [name] [name] is a 40 y.o. female with a hx of chronic dizziness/presyncope/syncope, PCOS, prior bariatric surgery, former tobacco use, COVID-19,  Nonobstructive CAD last seen 10/14/2020 by Dr. Harrell Gave.  She will previous event monitor December 2019 and more recent July 2021 with no significant pauses or arrhythmia.  Symptoms did not correlate with arrhythmia.  Orthostatics June 2021 with appropriate elevation of heart rate without drop in blood pressure.  She does have a loop recorder in place.  When last seen June 2022 she noted she was continuing to drink Gatorade and water, increasing salt intake, and wearing compression socks.  She was trialed on Florinef but after 3 days became nauseous and could not tolerate.  She presents today for follow up. When her symptoms recurred the beginning of July it seemed sudden onset. The last day she worked was July 18th. The next day she woke up and her heart rate when she woke up was 38 or 39bpm. She felt that way for two days.   She was recommended by her job to remain out of work while she figures out her symptoms per her report. 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   Drinks water throughout the day. She aims for 64 to 80 oz. She drinks a Gatorade first thing in the morning. She is adding lots of salt to her food though does not like the taste of salt. She eats regular snacks. Not wearing compression stockings with shorts in the summer time.  Does not increased stress with family as her dad was diagnosed with prostate cancer and has been diagnosed with cancer. Her MIL was recently diagnosed with stage IV lung cancer. Also stressors with financial issues. Also wonders if the heat is contributory.   Tries not to use her Klonopin as she was concerned it would affect her heart. Uses one every 2-3 weeks.   Lab work 09/23/20 TSH 1.000, A1c 5.7, Hb 14.4, K 4.1, AST 14, ALT 12  Position BP HR Symptoms  Lying 109/74 86 dizzy  Sitting 104/67 51 "little dizzy"  Standing (0 min) 104/72 59 none  Standing (3 min) 104/79 60 none    EKGs/Labs/Other Studies Reviewed:    The following studies were reviewed today:  [place] LE Venous 08/26/2020: Summary:  Right:  - No evidence of deep vein thrombosis from the common femoral through the  popliteal veins.  - No evidence of superficial venous thrombosis.  - The common femoral vein is not competent.  - The great saphenous vein is not competent at the knee and proximal calf.  - The small saphenous vein is competent.     Left:  - No evidence of deep vein thrombosis from the common femoral through the  popliteal veins.  - No evidence of superficial venous thrombosis.  - The deep venous system is not competent.  - The great saphenous vein is ablated proximally and competent distally.  - The small saphenous vein is not competent.  Echo 06/22/2020: 1. Left ventricular ejection fraction, by estimation, is 60 to 65%. The  left ventricle has normal function. The left ventricle has no regional  wall motion abnormalities. There is mild left ventricular hypertrophy.  Left ventricular diastolic parameters  were normal.   2. Right ventricular systolic function is normal. The right ventricular  size is normal. Tricuspid regurgitation signal is inadequate for assessing  PA pressure.   3. The mitral valve is normal in structure. No evidence of mitral valve  regurgitation.   4. The aortic valve was not well visualized. Aortic valve regurgitation  is not visualized. No aortic stenosis is present.   5. The inferior vena cava is normal in size with greater than 50%  respiratory variability, suggesting right atrial pressure of 3 mmHg.   Loop Recorder Insertion 06/22/2020: CONCLUSIONS:  1. Successful implantation of a Medtronic Reveal LINQ implantable loop recorder for unexplained syncope  2. No early apparent complications.    Monitor 11/23/2019: 12 days of data recorded on Zio monitor. Patient had a min HR of 37 bpm, max HR of 180 bpm, and avg HR of 67 bpm. Predominant underlying rhythm was Sinus Rhythm. No VT, SVT,  atrial fibrillation, high degree block, or pauses noted. Isolated atrial and ventricular ectopy was rare (<1%). There were 91 triggered events. These were all sinus, with occasional isolate PAC or PVC. No significant arrhythmias detected.   Monitor 04/21/18 NSR Rare PAC/PVC s Symptoms do not correlate with arrhythmia Average HR 69 bpm   CT coronary 03/23/18 Coronary Arteries:  Normal coronary origin.  Right dominance.   RCA is a large dominant artery that gives rise to PDA and PLVB. There is no plaque.   Left main is a large artery that gives rise to LAD and LCX arteries. Left main has no plaque.   LAD is a large vessel that gives rise to two diagonal arteries. Proximal LAD has mild calcified plaque with associated stenosis 0-25%. Mid LAD has a mild non-calcified plaque with associated stenosis 0-25%.   LCX is a small non-dominant artery that gives rise to one small OM1 branch. There is no plaque.   Other findings:   Normal pulmonary vein drainage into the left atrium.   Normal let atrial appendage without a thrombus.   IMPRESSION: 1. Coronary calcium score of 46. This was 81 percentile for age and sex matched control.   2. Normal coronary origin with right dominance.   3. 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 Aggressive medical management is recommended.   4. Mildly dilated pulmonary artery measuring 32 mm suggestive of pulmonary hypertension.    EKG:  No EKG today.  Recent Labs: 06/21/2020: Magnesium 2.0 09/23/2020: ALT 12; BUN 13; Creatinine, Ser 0.90; Hemoglobin 14.4; Platelets 319; Potassium 4.1; Sodium 140; TSH 1.000  Recent Lipid Panel    Component Value Date/Time   CHOL 205 (H) 08/18/2020 1015   TRIG 65 08/18/2020 1015   HDL 49 08/18/2020 1015   CHOLHDL 4.2 08/18/2020 1015   CHOLHDL 3.5 03/23/2018 0623   VLDL 8 03/23/2018 0623   LDLCALC 144 (H) 08/18/2020 1015   LDLDIRECT 141 (H) 09/23/2020 1130    Home Medications   Current Meds   Medication Sig   APPLE CIDER VINEGAR PO Take 1 tablet by mouth daily.   ascorbic acid (VITAMIN C) 500 MG tablet Take 500 mg by mouth daily.   Biotin w/ Vitamins C & E (HAIR/SKIN/NAILS PO) Take 1 tablet by mouth daily.  Chromium Picolinate 200 MCG CAPS Take 1 capsule by mouth daily.   clonazePAM (KLONOPIN) 0.5 MG tablet Take 1 tablet (0.5 mg total) by mouth 2 (two) times daily as needed for anxiety.   Coenzyme Q10 (COQ10) 100 MG CAPS Take 1 capsule by mouth daily.   cyclobenzaprine (FLEXERIL) 10 MG tablet Take 1 tablet (10 mg total) by mouth 2 (two) times daily as needed for muscle spasms.   Docosahexaenoic Acid (DHA COMPLETE PO) Take 1 tablet by mouth daily.   influenza vac split quadrivalent PF (FLUARIX) 0.5 ML injection TO BE ADMINISTERED BY PHARMACIST (Patient taking differently: TO BE ADMINISTERED BY PHARMACIST)   levonorgestrel (MIRENA) 20 MCG/24HR IUD 1 each by Intrauterine route once.   loratadine (CLARITIN) 10 MG tablet Take 10 mg by mouth daily as needed for allergies.   metFORMIN (GLUCOPHAGE) 500 MG tablet TAKE 1 TABLET BY MOUTH EVERY MORNING.   Multiple Vitamins-Minerals (BARIATRIC MULTIVITAMINS/IRON PO) Take 1 tablet by mouth in the morning and at bedtime.   nystatin cream (MYCOSTATIN) Apply 1 application topically 2 (two) times daily. Apply to affected area BID for up to 7 days.   Probiotic Product (PROBIOTIC ADVANCED PO) Take 1 capsule by mouth daily.    rosuvastatin (CRESTOR) 10 MG tablet Take 1 tablet (10 mg total) by mouth daily.   Spirulina 500 MG TABS Take 1 tablet by mouth daily.   SUMAtriptan (IMITREX) 50 MG tablet TAKE 1 TABLET BY MOUTH AT ONSET OF MIGRAINE, MAY REPEAT IN 2 HOURS IF HEADACHE PERSISTS   Vitamin D, Ergocalciferol, (DRISDOL) 1.25 MG (50000 UNIT) CAPS capsule TAKE 1 CAPSULE BY MOUTH EVERY 7 DAYS     Review of Systems      All other systems reviewed and are otherwise negative except as noted above.  Physical Exam    VS:  BP 102/70   Pulse (!) 45    Ht '5\' 8"'$  (1.727 m)   Wt 229 lb (103.9 kg)   SpO2 100%   BMI 34.82 kg/m  , BMI Body mass index is 34.82 kg/m.  Wt Readings from Last 3 Encounters:  12/06/20 229 lb (103.9 kg)  11/02/20 231 lb (104.8 kg)  10/14/20 238 lb 12.8 oz (108.3 kg)    GEN: Well nourished, well developed, in no acute distress. HEENT: normal. Neck: Supple, no JVD, carotid bruits, or masses. Cardiac: RRR, no murmurs, rubs, or gallops. No clubbing, cyanosis, edema.  Radials/PT 2+ and equal bilaterally.  Respiratory:  Respirations regular and unlabored, clear to auscultation bilaterally. GI: Soft, nontender, nondistended. MS: No deformity or atrophy. Skin: Warm and dry, no rash. Neuro:  Strength and sensation are intact. Psych: Normal affect.  Assessment & Plan    Near-syncope  - Previous event monitors and loop recorder with no significant arrhythmia. Suspicion for vagal/autonomic etiology though orthostatics, including today, unrevealing. Previously intolerant of Florinef with nausea. 11/06/20 loop recorder report with 2 episodes showing SR/ST. Loop recorder report pulled today during time of her reported episode shows SB with HR in the 50s. No change in PR interval, dropped beats, nor arrhythmia.  Thus far cardiac workup unremarkable by loop recorder. Encouraged to re-establish with neurology. Last seen 2019. Next appt with neurology scheduled for 02/20/21 - will route them a message to see if we can facilitate earlier appointment for near syncope. Recommend increased intake of electrolyte supplement. Recommend resume wearing compression stockings. Recommend slow position changes.  Discussed with Dr. Harrell Gave - no cardiac indication for leave of absence from work or disability. Will update  her FLMA to include 3 episodes per month and time of office visits.   Nonobstructive CAD - Cardiac CTA 03/2018 mild nonobstructive CAD in prox and mid LAD, coronary calcium score of 46 (99th percentile). Stable with no anginal  symptoms. No indication for ischemic evaluation. GDMT includes statin, as below.   HLD, LDL goal <70 - 09/23/20 LDL 141. Has never taken statin. Start Rosuvastatin '10mg'$  QD. Plan to repeat lipid panel at follow up.   Disposition: Follow up  as scheduled 01/20/21  with Dr. Harrell Gave or APP.  Signed, Loel Dubonnet, NP 12/06/2020, 9:34 AM [place]

## 2020-12-06 ENCOUNTER — Telehealth: Payer: Self-pay

## 2020-12-06 ENCOUNTER — Other Ambulatory Visit: Payer: Self-pay

## 2020-12-06 ENCOUNTER — Encounter (HOSPITAL_BASED_OUTPATIENT_CLINIC_OR_DEPARTMENT_OTHER): Payer: Self-pay | Admitting: Family

## 2020-12-06 ENCOUNTER — Other Ambulatory Visit (HOSPITAL_COMMUNITY): Payer: Self-pay

## 2020-12-06 ENCOUNTER — Ambulatory Visit (INDEPENDENT_AMBULATORY_CARE_PROVIDER_SITE_OTHER): Payer: No Typology Code available for payment source | Admitting: Family

## 2020-12-06 VITALS — BP 102/70 | HR 45 | Ht 68.0 in | Wt 229.0 lb

## 2020-12-06 DIAGNOSIS — I2510 Atherosclerotic heart disease of native coronary artery without angina pectoris: Secondary | ICD-10-CM

## 2020-12-06 DIAGNOSIS — R55 Syncope and collapse: Secondary | ICD-10-CM | POA: Diagnosis not present

## 2020-12-06 DIAGNOSIS — E785 Hyperlipidemia, unspecified: Secondary | ICD-10-CM | POA: Diagnosis not present

## 2020-12-06 MED ORDER — ROSUVASTATIN CALCIUM 10 MG PO TABS
10.0000 mg | ORAL_TABLET | Freq: Every day | ORAL | 2 refills | Status: DC
  Filled 2020-12-06 – 2020-12-29 (×2): qty 30, 30d supply, fill #0

## 2020-12-06 NOTE — Patient Instructions (Addendum)
Medication Instructions:  Your physician has recommended you make the following change in your medication:   START Rosuvastatin one '10mg'$  tablet daily  *If you need a refill on your cardiac medications before your next appointment, please call your pharmacy*  Lab Work: Your physician recommends that you return for lab work this week: BMP, thyroid panel, CBC, magnesium  Testing/Procedures: None ordered today.   Follow-Up: At [facility], you and your health needs are our priority.  As part of our continuing mission to provide you with exceptional heart care, we have created designated Provider Care Teams.  These Care Teams include your primary Cardiologist (physician) and Advanced Practice Providers (APPs -  Physician Assistants and Nurse Practitioners) who all work together to provide you with the care you need, when you need it.  We recommend signing up for the patient portal called "MyChart".  Sign up information is provided on this After Visit Summary.  MyChart is used to connect with patients for Virtual Visits (Telemedicine).  Patients are able to view lab/test results, encounter notes, upcoming appointments, etc.  Non-urgent messages can be sent to your provider as well.   To learn more about what you can do with MyChart, go to [email or web address].    Your next appointment:   As scheduled with Dr. Harrell Gave   Other Instructions  Try adding in another Gatorade during the day.   Heart Healthy Diet Recommendations: A low-salt diet is recommended. Meats should be grilled, baked, or boiled. Avoid fried foods. Focus on lean protein sources like fish or chicken with vegetables and fruits. The American Heart Association is a Microbiologist  Exercise recommendations: The American Heart Association recommends 150 minutes of moderate intensity exercise weekly. Try 30 minutes of moderate intensity exercise 4-5 times per week. This could include walking, jogging, or swimming.   Start slow! If you get 10 minutes of exercise per week and try to get up to 20 minutes.

## 2020-12-06 NOTE — Telephone Encounter (Signed)
Patients device shows 14 symptom events logged. No other alerts have been triggered since 10/10/20. 1 EGM saved during symptoms activated forward appears sinus brady ~ 50 bpm. No brady events triggered and no pauses events noted.

## 2020-12-06 NOTE — Telephone Encounter (Signed)
Reviewed by Dr. Harrell Gave and myself in clinic. No significant arrhythmia no pause.  No change in PR intervals.  No acute findings.  No cardiac etiology of her symptoms.  Loel Dubonnet, NP

## 2020-12-06 NOTE — Telephone Encounter (Signed)
Pt completed appointment on 8/2 with Laurann Montana, NP

## 2020-12-06 NOTE — Telephone Encounter (Signed)
-----   Message from Loel Dubonnet, NP sent at 12/06/2020  9:37 AM EDT ----- [name] there! [name] reported today for near-syncope. Can we do a forced pull of her ILR device? Events were approx 7/13. Thank you!!

## 2020-12-06 NOTE — Progress Notes (Signed)
[facility] Summary Report / Loop Recorder 

## 2020-12-07 ENCOUNTER — Encounter (HOSPITAL_BASED_OUTPATIENT_CLINIC_OR_DEPARTMENT_OTHER): Payer: Self-pay

## 2020-12-07 ENCOUNTER — Encounter (HOSPITAL_BASED_OUTPATIENT_CLINIC_OR_DEPARTMENT_OTHER): Payer: Self-pay | Admitting: Family

## 2020-12-12 ENCOUNTER — Other Ambulatory Visit (HOSPITAL_COMMUNITY): Payer: Self-pay

## 2020-12-12 ENCOUNTER — Ambulatory Visit (INDEPENDENT_AMBULATORY_CARE_PROVIDER_SITE_OTHER): Payer: No Typology Code available for payment source | Admitting: Neurology

## 2020-12-12 ENCOUNTER — Encounter: Payer: Self-pay | Admitting: Neurology

## 2020-12-12 VITALS — BP 102/74 | HR 61 | Ht 68.0 in | Wt 230.0 lb

## 2020-12-12 DIAGNOSIS — R42 Dizziness and giddiness: Secondary | ICD-10-CM | POA: Diagnosis not present

## 2020-12-12 DIAGNOSIS — R001 Bradycardia, unspecified: Secondary | ICD-10-CM | POA: Diagnosis not present

## 2020-12-12 DIAGNOSIS — R55 Syncope and collapse: Secondary | ICD-10-CM | POA: Diagnosis not present

## 2020-12-12 DIAGNOSIS — G4719 Other hypersomnia: Secondary | ICD-10-CM

## 2020-12-12 DIAGNOSIS — G43101 Migraine with aura, not intractable, with status migrainosus: Secondary | ICD-10-CM

## 2020-12-12 MED ORDER — EMGALITY 120 MG/ML ~~LOC~~ SOAJ
120.0000 mg | SUBCUTANEOUS | 5 refills | Status: DC
  Filled 2020-12-12: qty 2, 56d supply, fill #0

## 2020-12-12 NOTE — Progress Notes (Signed)
SLEEP MEDICINE CLINIC   Provider:  Larey Seat, MD   Primary Care Physician:  PA Abonza/ Dr Joycelyn Rua, MD Gynaecology   Referring Provider: Buford Dresser, MD / [facility].    Chief Complaint  Patient presents with   NEW PROBLEM :     pt alone, rm 10. pt states she went to hospital with chest tightness and dizziness and the dizziness has not gone away.  She feels as if her car is moving while standing at a light- had to give up driving- she states that if she is laying down she is fine. she doesnt notice if there is a certain thing that happens that causes it to start. She had bariatric surgery and lost 120 lbs and she doesnt snore or anything so she stopped using the CPAP machine.  She is here for a syncope evaluation and had spells almost 3 a week- she has had many more near syncope- she has fainted while checking a patient in. She has headaches afterwards.   Internal loop recorder stated bradycardia.  Implanted 2-16 2022.    Other- last sleep study:     DATE OF RECORDING: 12/14/2016 REFERRING M.D.:  Velna Hatchet, MD Study Performed:   Titration to PAP HISTORY:  Pt returning for CPAP titration following a PSG at our lab, performed on 11/29/2016, with an AHI of 18.6/hr. and low Sp02 of 91%. The patient is in preparation for bariatric surgery.  The patient endorsed the Epworth Sleepiness Scale at 12 points and the Fatigue Score at 40 points.   The patient's weight 302 pounds with a height of 68 (inches), resulting in a BMI of 45.8 kg/m2. The patient's neck circumference measured 17 inches.         [name] [name] is a 40 y.o. female , seen here as in a new referral  from Cardiology.  The patient works in a healthcare setting and had originally seen me over 3 years ago for a sleep apnea evaluation at the time she was preparing for gastric sleeve procedure with Dr. Johnathan Hausen which was performed on 10-16 2018 and she lost 120 pounds, did not meet her CPAP any  longer after that.  In February of this year she developed syncopal spells.  A loop recorder was implanted and documented bradycardia.   She does have headaches following such spells and the incidence of near syncope spells have become very frequent at least 3 times a week or more. Headaches are frontal - throbbing,  a feeling of pressure- behind the eyes.  1 time she fainted by checking in a patient at the hospital.  So there is a significant impairment and she had to give up driving because she felt that her car was in motion but actually parked or standing.  None of her spells has been associated with loss of bowel or bladder control, there has been no tonic-clonic activity no tongue bite.  No injuries.  She does have a little feeling of chest tightness rather than chest pain and a sensation of vertigo moving while not actually in motion. She hydrates well, has increased a bit of NaCl.  The spells have been without much morning she could be standing in a grocery store looking at her self and feel that she is going down "".  She does not have an aura warning her much.  Another observation is that in spite of good hydration and increased salt intake her frequency of spells has not changed.  She does not think there is a relation to the time of day of the last meal.Bp has been low, too.  Sinus bradycardia, no arhythmia found. Dr. Rayann Heman now follows.  Vertigo can be provoked by head movements. Vertigo also present when getting up in the morning.  She feels more tired again.   Orthostatics last Tuesday were abnormal- he heart rate dropped when standing: supine HR was 87 reg, seated  HR 58 and standing 51 bpm, and BP was 104/ 72 mmHg.   I would like to add that there is fully vaccinated patient at the time contracted COVID in October 2021 and had severe illness.  She lost sense of smell and taste, she had probably contracted the delta variant of the virus.  She had vomiting diarrhea cough and headaches, she  also was febrile for several days.  Was out of work 3-4 weeks- She was fully recovered only 2 to 3 months later.    Last sleep clinic visit. CD 12-14-2016.I have the pleasure of seeing [name] [name] today, I mean by 40 year old female patient whom I had encountered last in July 2018 when she had undergone a baseline polysomnography, at the time her weight was 303 pounds with a BMI of 46, she was diagnosed with an AHI of 18.6/h but did not meet the criteria for split-night polysomnography for this reason she was asked to return for a separate CPAP titration.  I would like to add that the patient did retain some CO2 and she did have mostly hypopneas instead of frank apneas which indicated obesity hypoventilation.  She returned for the CPAP titration on 14 December 2016 and was titrated to CPAP at 6 and 7 cmH2O but central apneas emerged.   Her technologist changed her briefly to a bilevel -PAP which achieved no central apnea control. She felt poorly after BiPAP, and was using the CPAP device for about 5 month until she achieved weight loss from a gastric sleeve procedure which Dr Kaylyn Lim performed on 02-19-2017.  She discontinued CPAP without talking to [place], and she has not been seen in follow up after PSG and titration study. She returns today for a presumed new problem- she has a possible seizure.   The patient presented after about 100 pound weight loss following the gastric sleeve gastrectomy with a history of polycystic ovarian syndrome, type 2 diabetes mellitus and chronic venous insufficiency in the setting of bradycardia.   Initially she thought she had anxiety symptoms so she took a low-dose diazepam but did not experience any improvement.  There was substernal chest discomfort with radiation into both arms and into the neck.  While standing in line at [facility] [employer] she felt like she was going to pass out first her feet seem to be numb and she felt that she could not carry her weight.  Then the  discomfort as described as a pressure in her chest also occurred and she began breathing more fast.  Her heart rate was supposedly in the low 40s per minute.  She subsequently came to the emergency room and then to Dr. Caryl Comes.   EKG in the emergency room showed sinus bradycardia 49 bpm with incomplete right bundle branch block and low voltage QRS complexes.  Prior EKG from 2018 preoperative had shown a similar sinus arrhythmia.  She did not have elevated cardiac enzymes she is not taking any AV nodal blocking agents, orthostatic vital signs were outlined.  She had a normal CMP.  On 16 November, while  lying down,  her blood pressure was 119/72 mmhg  with a heart rate of only 44 bpm.  While seated, her  heart rate elevated to 65 bpm,  and standing heart rate elevated to 52 bpm.        She now is scheduled for a varicose vein procedure within the next week.  Dr. Doren Custard at [facility] will treat her.  In the meantime she was fitted for high compression stockings with a gradient of 25 mmHg.  She had presented to the [facility] emergency room with a near syncope and chest pains the symptoms have started just the night before she presented to Dr. Caryl Comes on 15 November.  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  On 10/29/2016 she underwent gynecological surgery and it was after the surgery was completed that her gynecologist noted her irregular breathing and heart rate ( ? ) which delayed her d/c from the recovery unit. It was Dr. Joycelyn Rua who then initiated to sleep consult to rule out apnea. She does not longer snore at home. She reports no problems with orthopnea, she can lay flat can sleep on one pillow. Recent Hgb A1c in May of this year was 4.9  when her bariatric surgeon evaluated her post gastric sleeve.  She changed her diet and she is now working out 4 times a week.    2018 note CD _ Sleep habits are as follows: [name] [name] reports that she sleeps in a cool, quiet and dark room and can  sleep on one pillow, usually falls asleep on her side. She usually goes to bed between 9 and 10 PM but once she is asleep she cannot sleep through the night. There are 2-3 bathroom breaks each night, she also snores herself awake and has been choking in her sleep. She reports having vivid dreams that are usually unpleasant rather threatening or nightmarish in content. She reports having vivid dreams since childhood, not strictly related to her more recent diagnoses of anxiety and depression. She usually wakes up spontaneously around 6 AM, just before her alarm rings and sometimes wakes without alarm ringing. She reports not feeling refreshed or restored in the morning she reports a very dry mouth, hip pain and lower back pain also present, and she wakes up with headaches frequently.  Sleep medical history and family sleep history:  The patient reports that she suffered from night terrors as a child, she would scream loudly and it was difficult for her family to calm her down or wake her. Her father has been diagnosed with obstructive sleep apnea and uses CPAP, her sister tested negative for OSA, paternal aunt has obstructive sleep apnea as well. The patient's sister had bariatric surgery, so did the paternal aunt.  No ENT surgeries such as tonsillectomy and septoplasty have taken place,.   Social history: Forensic psychologist.The patient has one biological child a son,now  52 years old. 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 She quit smoking in March 2018 used to smoke 1 pack per day.  Alcohol use-1-3 drinks a month, caffeine use one caffeinated beverage a week now, small amounts of chocolate   A repeat orthostatic blood pressure and heart rate show again that in supine blood pressure was 121/79 mmHg heart rate was 49 regular beats per minute, seated heart rate was 52 bpm and blood pressure 118/83, standing heart rate went  up  to 61 blood pressure remained at 121/82.  Again she noted dizziness after standing for about 30 seconds, she did not notice dizziness while in motion, she can walk without a wide-based and she can turn with 3 steps there is no vertigo noted.  This finding seem to be related to a low heart rate response. She had one fall in a syncope on 03-27-2018 which let to a bone injury, right , dominant arm.  Dysautonomia? Postural bradycardia syndrome ?  Review of Systems: Out of a complete 14 system review, the patient complains of only the following symptoms, and all other reviewed systems are negative. Sleep headaches in the last 3 month, snoring, apnea, vivid dreams.   Epworth score after weight loss was in 2018 2  on CPAP down from 12 -Points pre weight loss and without CPAP How likely are you to doze in the following situations: 0 = not likely, 1 = slight chance, 2 = moderate chance, 3 = high chance  Sitting and Reading? Watching Television? Sitting inactive in a public place (theater or meeting)? Lying down in the afternoon when circumstances permit? Sitting and talking to someone? Sitting quietly after lunch without alcohol? In a car, while stopped for a few minutes in traffic? As a passenger in a car for an hour without a break?  Total =10 again in 2022,   Fatigue severity score is now 39 again, almost as high as before CPAP-  depression score n/a. Weight loss after sleeve in 2018- Syncope in 2022- bradycardia. Covid- postviral illness, fatigue?    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   Social Designer, fashion/clothing strain: Not on file   Food insecurity:    Worry: Not on file    Inability: Not on file    Transportation needs:    Medical: Not on file    Non-medical: Not on file  Tobacco Use   Smoking status: Former Smoker    Packs/day: 0.25    Years: 19.00    Pack years: 4.75    Types: Cigarettes    Last attempt to quit: 06/06/2016    Years since quitting: 1.8   Smokeless tobacco: Never Used  Substance and Sexual Activity   Alcohol use: No    Frequency: Never   Drug use: No   Sexual activity: Yes    Partners: Male    Birth control/protection: None, IUD  Lifestyle   Physical activity:    Days per week: Not on file    Minutes per session: Not on file   Stress: Not on file  Relationships   Social connections:    Talks on phone: Not on file    Gets together: Not on file    Attends religious service: Not on file    Active member of club or organization: Not on file    Attends meetings of clubs or organizations: Not on file    Relationship status: Not on file   Intimate partner violence:    Fear of current or ex partner: Not on file    Emotionally abused: Not on file    Physically abused: Not on file  Forced sexual activity: Not on file  Other Topics Concern   Not on file  Social History Narrative   Not on file    Family History  Problem Relation Age of Onset   Hypertension Mother    Diabetes Mother    Depression Mother    Obesity Mother    Diabetes Father    Thyroid disease Father    Hypertension Father    Parkinson's disease Father    Dementia Father    Hyperlipidemia Father    Heart disease Father    Sleep apnea Father    Obesity Father    Colon cancer Maternal Uncle    Breast cancer Paternal Aunt    Diabetes Maternal Grandmother    Heart disease Maternal Grandmother    Diabetes Maternal Grandfather    Heart disease Maternal Grandfather    Stroke Maternal Grandfather    Diabetes Paternal Grandmother    Diabetes Paternal Grandfather    Heart disease Paternal Grandfather    Alcohol abuse Paternal Grandfather     Past Medical History:  Diagnosis Date    Abnormal uterine bleeding    Amenorrhea    Anemia    Anxiety    panic attacks   Back pain    Bradycardia    Chest pain    Constipation    COVID 02/2020   Diabetes ([place])    Dysmenorrhea    Endometriosis    Gallbladder problem    GERD (gastroesophageal reflux disease)    occasional - diet controlled and tums prn   HLD (hyperlipidemia)    HTN (hypertension)    IBS (irritable bowel syndrome)    Infertility, female    Insulin resistance    Migraine without aura    Missed abortion    x 2 - both resolved without surgery   Obesity    PCOS (polycystic ovarian syndrome)    PCOS (polycystic ovarian syndrome)    PONV (postoperative nausea and vomiting)    pe rpatient ; scopalamine patch very helpful    Sleep apnea    SVD (spontaneous vaginal delivery)    x 1   Syncope and collapse 06/21/2020   Urinary incontinence    Vitamin D deficiency     Past Surgical History:  Procedure Laterality Date   CHOLECYSTECTOMY     COLONOSCOPY  2017   polyps   CYSTOSCOPY N/A 10/29/2016   Procedure: CYSTOSCOPY;  Surgeon: Salvadore Dom, MD;  Location: [facility];  Service: Gynecology;  Laterality: N/A;   DILATATION & CURRETTAGE/HYSTEROSCOPY WITH RESECTOCOPE N/A 10/17/2012   Procedure: DILATATION & CURETTAGE/HYSTEROSCOPY WITH RESECTOCOPE; Possible Polypectomy, Possible Resectoscopic Myomectomy.;  Surgeon: Floyce Stakes. Pamala Hurry, MD;  Location: [facility];  Service: Gynecology;  Laterality: N/A;  1 hr.   DILATION AND CURETTAGE OF UTERUS     ENDOMETRIAL BIOPSY     ENDOVENOUS ABLATION SAPHENOUS VEIN W/ LASER Right 05/08/2018   endovenous laser ablation right greater saphenous vein and stab phlebectomy > 20 incisions right leg by Deitra Mayo MD    ESOPHAGOGASTRODUODENOSCOPY ENDOSCOPY  2017   Normal   HYSTEROSCOPY WITH D & C N/A 12/01/2013   Procedure: IUD Removal;  Surgeon: Floyce Stakes. Pamala Hurry, MD;  Location: [facility];  Service: Gynecology;  Laterality: N/A;  90 min.   INTRAUTERINE DEVICE (IUD) INSERTION  N/A 10/17/2012   Procedure: INTRAUTERINE DEVICE (IUD) INSERTION; Mirena;  Surgeon: Floyce Stakes. Pamala Hurry, MD;  Location: [facility];  Service: Gynecology;  Laterality: N/A;   LAPAROSCOPIC GASTRIC SLEEVE RESECTION N/A 02/19/2017  Procedure: LAPAROSCOPIC GASTRIC SLEEVE RESECTION, UPPER ENDOSCOPY;  Surgeon: Johnathan Hausen, MD;  Location: [facility];  Service: General;  Laterality: N/A;   LAPAROSCOPIC SALPINGO OOPHERECTOMY Left 10/29/2016   Procedure: LAPAROSCOPIC SALPINGO OOPHORECTOMY With anterior uterine biopsy;  Surgeon: Salvadore Dom, MD;  Location: [facility];  Service: Gynecology;  Laterality: Left;   LASIK  10/06/2018   LOOP RECORDER INSERTION N/A 06/22/2020   Procedure: LOOP RECORDER INSERTION;  Surgeon: Evans Lance, MD;  Location: [facility];  Service: Cardiovascular;  Laterality: N/A;   MOUTH SURGERY     wisdom teeth    Current Outpatient Medications  Medication Sig Dispense Refill   APPLE CIDER VINEGAR PO Take 1 tablet by mouth daily.     ascorbic acid (VITAMIN C) 500 MG tablet Take 500 mg by mouth daily.     Biotin w/ Vitamins C & E (HAIR/SKIN/NAILS PO) Take 1 tablet by mouth daily.      Chromium Picolinate 200 MCG CAPS Take 1 capsule by mouth daily.     clonazePAM (KLONOPIN) 0.5 MG tablet Take 1 tablet (0.5 mg total) by mouth 2 (two) times daily as needed for anxiety. 20 tablet 1   Coenzyme Q10 (COQ10) 100 MG CAPS Take 1 capsule by mouth daily.     cyclobenzaprine (FLEXERIL) 10 MG tablet Take 1 tablet (10 mg total) by mouth 2 (two) times daily as needed for muscle spasms. 30 tablet 0   Docosahexaenoic Acid (DHA COMPLETE PO) Take 1 tablet by mouth daily.     influenza vac split quadrivalent PF (FLUARIX) 0.5 ML injection TO BE ADMINISTERED BY PHARMACIST (Patient taking differently: TO BE ADMINISTERED BY PHARMACIST) .5 mL 0   levonorgestrel (MIRENA) 20 MCG/24HR IUD 1 each by Intrauterine route once.     loratadine (CLARITIN) 10 MG tablet Take 10 mg by mouth daily as needed for  allergies.     metFORMIN (GLUCOPHAGE) 500 MG tablet TAKE 1 TABLET BY MOUTH EVERY MORNING. 90 tablet 0   Multiple Vitamins-Minerals (BARIATRIC MULTIVITAMINS/IRON PO) Take 1 tablet by mouth in the morning and at bedtime.     nystatin cream (MYCOSTATIN) Apply 1 application topically 2 (two) times daily. Apply to affected area BID for up to 7 days. 30 g 1   Probiotic Product (PROBIOTIC ADVANCED PO) Take 1 capsule by mouth daily.      rosuvastatin (CRESTOR) 10 MG tablet Take 1 tablet (10 mg total) by mouth daily. 30 tablet 2   Spirulina 500 MG TABS Take 1 tablet by mouth daily.     SUMAtriptan (IMITREX) 50 MG tablet TAKE 1 TABLET BY MOUTH AT ONSET OF MIGRAINE, MAY REPEAT IN 2 HOURS IF HEADACHE PERSISTS 10 tablet 0   Vitamin D, Ergocalciferol, (DRISDOL) 1.25 MG (50000 UNIT) CAPS capsule TAKE 1 CAPSULE BY MOUTH EVERY 7 DAYS 12 capsule 0   No current facility-administered medications for this visit.    Allergies as of 12/12/2020 - Review Complete 12/12/2020  Allergen Reaction Noted   Iohexol Hives 10/18/2008   Pyridium [phenazopyridine hcl] Other (See Comments) 05/07/2019    Vitals: BP 102/74   Pulse 61   Ht '5\' 8"'$  (1.727 m)   Wt 230 lb (104.3 kg)   BMI 34.97 kg/m    02-11-2021: [name] [name] describes a feeling of lightheadedness after about 30 seconds of standing motionless.  Seated across for me now she states that she also becomes slightly lightheaded now and it affects the right side of her body, visual field, a sensation of numbness  and tingling coming over the right side.  There is associated headache, there is nausea.  Complicated migraine ? Marland Kitchen   Last Weight:  Wt Readings from Last 1 Encounters:  12/12/20 230 lb (104.3 kg)   [id number] mass index is 34.97 kg/m.  Post gastric sleeve -     Last Height:   Ht Readings from Last 1 Encounters:  12/12/20 '5\' 8"'$  (1.727 m)    Physical exam:  General: The patient is awake, alert and appears not in acute distress. The patient is well  groomed. Head: Normocephalic, atraumatic. Neck is supple. Mallampati 1- pierced tongue.  neck circumference: 15". . Nasal airflow patent -, Retrognathia is not seen.  Cardiovascular:  Regular rate and rhythm , without  murmurs or carotid bruit, and without distended neck veins. Respiratory: Lungs are clear to auscultation. Skin:  Without evidence of edema, or rash Trunk: BMI is now 34.9 k/m2.  The patient's posture is erect .   Neurologic exam : The patient is awake and alert, oriented to place and time.   Cranial nerves: Pupils are equal and briskly reactive to light.  Funduscopic exam without evidence of pallor or edema. Extraocular movements  in vertical and horizontal planes intact and without nystagmus,  but after provocation by rapid head movements there was nystagmus with upward gaze. . There is a feeling of pulling backward or the surrounding moving behind her- dizziness with head movements. Visual fields by finger perimetry are intact. She has tunnel vision before fainting- not now.  Hearing to finger rub intact. Facial sensation intact to fine touch.Facial motor strength is symmetric and tongue and uvula move midline. Shoulder shrug was symmetrical.   Motor exam: Normal tone, muscle bulk and symmetric strength in all extremities. Sensory:  Fine touch, pinprick and vibration were tested normal in all extremities.  Proprioception tested in the upper extremities was normal.   Coordination: Rapid alternating movements in the fingers/hands was normal.  Finger-to-nose maneuver  normal without evidence of ataxia, dysmetria or tremor. Gait and station: Patient walks without assistive device-  Stance is stable and normal.  Turns with 3  Steps. Romberg testing is negative. Deep tendon reflexes: in the upper and lower extremities are symmetric and intact. Babinski maneuver response is downgoing.   Assessment:  60 minute visit- After physical and neurologic examination, review of laboratory  studies,  Personal review of imaging studies, reports of other /same  Imaging studies, results of polysomnography and / or neurophysiology testing and pre-existing records as far as provided in visit., my assessment is :   1)  Unexplained bradycardia with venous insufficiency - incomplete RBBB.  The patient reportedly had no cardiac arrhythmia by I data gathered from her implanted loop recorder since February.  She describes a syncope that follows a brief tunnel vision and the feeling that the surroundings are moving behind her this is a vertigo sensation more than just a lightheadedness.  Vertigo was also easily provoked here today just with rapid head movements in all directions and left her with a nystagmus with upward gaze.  I did not notice nystagmus with gaze to the left or right.   There was more of a saccadic eye movement in the center of vision. These irregular eye movements as be past the center of her visual field are often seen in patients that have to correlate both visual fields to suppress diplopia.  ORTHOSTATICS repeated 12-13-2018.  In supine position the patient's blood pressure was 110/72 with a heart rate of 54  bpm regular.  Seated position 104/71 with a heart rate of 60 bpm, standing 102/70 mmHg with a heart rate of 59 bpm.  She reported dizziness and the feeling of being in motion when she first stood up and also when she first sat up.  Standing after 3 minutes her blood pressure was 108/76 with a heart rate of 71 which is a good compensation.   2) Migraines seem to affect her- she now reports a pressure in the forehead, bilaterally , not a side preference. A little nausea follow- no aura. Photophobia is stil present as is phonophobia (see notes in 2019-2018)  2)[name] [name] has still some risk factors for obstructive sleep apnea and I like to check in a HST if OSA is present again, she has returned to the same Epworth and FSS scores as prior to weight loss surgery.     Plan:   Treatment plan and additional workup : My goal is to avoid further fainting spells, I doubt seizures at origin. No loss of awareness, no tongue bite, no incontinence.   HST ordered.She has returned to the same Epworth and FSS scores as prior to weight loss surgery.   vertigo, followed by headaches.pre and full Syncope. Orthostatics, possible bradycardia from purely cardiologic etiology-  Hydration, hydration and use the compression stockings.   Migraine type headaches every 3-4 days, losing a lot of work time. I will not prescribe beta blockers or calcium channel blockers, even Topiramate -all can contribute to low BP. HR. Best preventive would be ajovy or emgality. Will start that pre authorization process.     The patient was advised of the nature of the diagnosed disorder , the treatment options and the  risks for general health and wellness arising from not treating the condition.  I spent more than 45 minutes of face to face time with the patient. Greater than 50% of time was spent in counseling and coordination of care. We have discussed the diagnosis and differential and I answered the patient's questions.     Rv with Np in 2-3 month followed up on HST and emgality headache response.    Larey Seat, MD [id number], [id number] AM  Certified in Neurology by ABPN Certified in [place] by [facility] [address], [place], [place] [address]

## 2020-12-12 NOTE — Patient Instructions (Signed)
Screening for Sleep Apnea  Sleep apnea is a condition in which breathing pauses or becomes shallow during sleep. Sleep apnea screening is a test to determine if you are at risk for sleep apnea. The test includes a series of questions. It will only takes a few minutes. Your health care provider may ask you to have this test in preparationfor surgery or as part of a physical exam. What are the symptoms of sleep apnea? Common symptoms of sleep apnea include: Snoring. Waking up often at night. Daytime sleepiness. Pauses in breathing. Choking or gasping during sleep. Irritability. Forgetfulness. Trouble thinking clearly. Depression. Personality changes. Most people with sleep apnea do not know that they have it. What are the advantages of sleep apnea screening? Getting screened for sleep apnea can help: Ensure your safety. It is important for your health care providers to know whether or not you have sleep apnea, especially if you are having surgery or have other long-term (chronic) health conditions. Improve your health and allow you to get a better night's rest. Restful sleep can help you: Have more energy. Lose weight. Improve high blood pressure. Improve diabetes management. Prevent stroke. Prevent car accidents. What happens during the screening? Screening usually includes being asked a list of questions about your sleep quality. Some questions you may be asked include: Do you snore? Is your sleep restless? Do you have daytime sleepiness? Has a partner or spouse told you that you stop breathing during sleep? Have you had trouble concentrating or memory loss? What is your age? What is your neck circumference? To measure your neck, keep your back straight and gently wrap the tape measure around your neck. Put the tape measure at the middle of your neck, between your chin and collarbone. What is your sex assigned at birth? Do you have or are you being treated for high blood  pressure? If your screening test is positive, you are at risk for the condition. Furthertesting may be needed to confirm a diagnosis of sleep apnea. Where to find more information You can find screening tools online or at your health care clinic. For more information about sleep apnea screening and healthy sleep, visit these websites: Centers for Disease Control and Prevention: http://www.wolf.info/ American Sleep Apnea Association: www.sleepapnea.org Contact a health care provider if: You think that you may have sleep apnea. Summary Sleep apnea screening can help determine if you are at risk for sleep apnea. It is important for your health care providers to know whether or not you have sleep apnea, especially if you are having surgery or have other chronic health conditions. You may be asked to take a screening test for sleep apnea in preparation for surgery or as part of a physical exam. This information is not intended to replace advice given to you by your health care provider. Make sure you discuss any questions you have with your healthcare provider. Document Revised: 04/01/2020 Document Reviewed: 04/01/2020 Elsevier Patient Education  2022 [place]. Dizziness Dizziness is a common problem. It is a feeling of unsteadiness or light-headedness. You may feel like you are about to faint. Dizziness can lead to injury if you stumble or fall. Anyone can become dizzy, but dizziness is more common in older adults. This condition can be caused by a number ofthings, including medicines, dehydration, or illness. Follow these instructions at home: Eating and drinking  Drink enough fluid to keep your urine pale yellow. This helps to keep you from becoming dehydrated. Try to drink more clear fluids, such  as water. Do not drink alcohol. Limit your caffeine intake if told to do so by your health care provider. Check ingredients and nutrition facts to see if a food or beverage contains caffeine. Limit your  salt (sodium) intake if told to do so by your health care provider. Check ingredients and nutrition facts to see if a food or beverage contains sodium.  Activity  Avoid making quick movements. Rise slowly from chairs and steady yourself until you feel okay. In the morning, first sit up on the side of the bed. When you feel okay, stand slowly while you hold onto something until you know that your balance is good. If you need to stand in one place for a long time, move your legs often. Tighten and relax the muscles in your legs while you are standing. Do not drive or use machinery if you feel dizzy. Avoid bending down if you feel dizzy. Place items in your home so that they are easy for you to reach without leaning over.  Lifestyle Do not use any products that contain nicotine or tobacco. These products include cigarettes, chewing tobacco, and vaping devices, such as e-cigarettes. If you need help quitting, ask your health care provider. Try to reduce your stress level by using methods such as yoga or meditation. Talk with your health care provider if you need help to manage your stress. General instructions Watch your dizziness for any changes. Take over-the-counter and prescription medicines only as told by your health care provider. Talk with your health care provider if you think that your dizziness is caused by a medicine that you are taking. Tell a friend or a family member that you are feeling dizzy. If he or she notices any changes in your behavior, have this person call your health care provider. Keep all follow-up visits. This is important. Contact a health care provider if: Your dizziness does not go away or you have new symptoms. Your dizziness or light-headedness gets worse. You feel nauseous. You have reduced hearing. You have a fever. You have neck pain or a stiff neck. Your dizziness leads to an injury or a fall. Get help right away if: You vomit or have diarrhea and are  unable to eat or drink anything. You have problems talking, walking, swallowing, or using your arms, hands, or legs. You feel generally weak. You have any bleeding. You are not thinking clearly or you have trouble forming sentences. It may take a friend or family member to notice this. You have chest pain, abdominal pain, shortness of breath, or sweating. Your vision changes or you develop a severe headache. These symptoms may represent a serious problem that is an emergency. Do not wait to see if the symptoms will go away. Get medical help right away. Call your local emergency services (911 in the [place] Do not drive yourself to the hospital. Summary Dizziness is a feeling of unsteadiness or light-headedness. This condition can be caused by a number of things, including medicines, dehydration, or illness. Anyone can become dizzy, but dizziness is more common in older adults. Drink enough fluid to keep your urine pale yellow. Do not drink alcohol. Avoid making quick movements if you feel dizzy. Monitor your dizziness for any changes. This information is not intended to replace advice given to you by your health care provider. Make sure you discuss any questions you have with your healthcare provider. Document Revised: 03/28/2020 Document Reviewed: 03/28/2020 Elsevier Patient Education  2022 [place] injection What is  this medication? GALCANEZUMAB (gal ka NEZ ue mab) is used to prevent migraines and treat clusterheadaches. This medicine may be used for other purposes; ask your health care provider orpharmacist if you have questions. COMMON BRAND NAME(S): Emgality What should I tell my care team before I take this medication? They need to know if you have any of these conditions: an unusual or allergic reaction to galcanezumab, other medicines, foods, dyes, or preservatives pregnant or trying to get pregnant breast-feeding How should I use this medication? This medicine is  for injection under the skin. You will be taught how to prepare and give this medicine. Use exactly as directed. Take your medicine atregular intervals. Do not take your medicine more often than directed. It is important that you put your used needles and syringes in a special sharps container. Do not put them in a trash can. If you do not have a sharpscontainer, call your pharmacist or healthcare provider to get one. Talk to your pediatrician regarding the use of this medicine in children.Special care may be needed. Overdosage: If you think you have taken too much of this medicine contact apoison control center or emergency room at once. NOTE: This medicine is only for you. Do not share this medicine with others. What if I miss a dose? If you miss a dose, take it as soon as you can. If it is almost time for yournext dose, take only that dose. Do not take double or extra doses. What may interact with this medication? Interactions are not expected. This list may not describe all possible interactions. Give your health care provider a list of all the medicines, herbs, non-prescription drugs, or dietary supplements you use. Also tell them if you smoke, drink alcohol, or use illegaldrugs. Some items may interact with your medicine. What should I watch for while using this medication? Tell your doctor or healthcare professional if your symptoms do not start toget better or if they get worse. What side effects may I notice from receiving this medication? Side effects that you should report to your doctor or health care professionalas soon as possible: allergic reactions like skin rash, itching or hives, swelling of the face, lips, or tongue Side effects that usually do not require medical attention (report these toyour doctor or health care professional if they continue or are bothersome): pain, redness, or irritation at site where injected This list may not describe all possible side effects. Call your  doctor for medical advice about side effects. You may report side effects to FDA at1-800-FDA-1088. Where should I keep my medication? Keep out of the reach of children. You will be instructed on how to store this medicine. Throw away any unusedmedicine after the expiration date on the label. NOTE: This sheet is a summary. It may not cover all possible information. If you have questions about this medicine, talk to your doctor, pharmacist, orhealth care provider.  2022 Elsevier/Gold Standard (2017-10-09 12:03:23)

## 2020-12-13 ENCOUNTER — Telehealth: Payer: Self-pay | Admitting: Neurology

## 2020-12-13 NOTE — Telephone Encounter (Signed)
PA submitted for the patient through CMM/medimpact. [id number] Will await response

## 2020-12-14 ENCOUNTER — Other Ambulatory Visit (HOSPITAL_COMMUNITY): Payer: Self-pay

## 2020-12-19 ENCOUNTER — Ambulatory Visit (INDEPENDENT_AMBULATORY_CARE_PROVIDER_SITE_OTHER): Payer: No Typology Code available for payment source

## 2020-12-19 ENCOUNTER — Other Ambulatory Visit (HOSPITAL_COMMUNITY): Payer: Self-pay

## 2020-12-19 DIAGNOSIS — R55 Syncope and collapse: Secondary | ICD-10-CM

## 2020-12-19 NOTE — Telephone Encounter (Signed)
PA DENIED-details below- pt should be able to use a copay card to help with cost of the medication or Dr Brett Fairy may want to consider trying alternative medication listed below.   The details below are written in medical language. If you have questions, please contact your provider. In some cases, the requested medication or alternatives offered may have additional approval requirements. Your provider requested Emgality for the preventative treatment of chronic migraines (you experience 15 or more migraine [a headache that can cause severe throbbing pain or a pulsing sensation, usually on one side of the head] days per month).When used for the preventative treatment of chronic migraines, our guideline named GALCANEZUMAB-GNLM (generic for [employer]) requires that you have tried ONE of the following preventative migraine treatments: valproic acid/divalproex sodium, topiramate, propranolol, timolol, metoprolol, amitriptyline, venlafaxine, atenolol, nadolol, or Botox. Your doctor told us that you cannot use topiramate, propranolol, timolol, metoprolol, atenolol or nadolol because of the risk of lowering your blood pressure but this information was not enough to meet our prior authorization rule for this medication given the option of other alternatives listed above. This is why your request was denied. Please work with your doctor to use a different medication or get [place] more information if it will allow [place] to approve this request. A written notification letter will follow with additional details.

## 2020-12-20 ENCOUNTER — Other Ambulatory Visit (HOSPITAL_COMMUNITY): Payer: Self-pay

## 2020-12-20 LAB — CUP PACEART REMOTE DEVICE CHECK
Date Time Interrogation Session: 20220814230454
Implantable Pulse Generator Implant Date: 20220216

## 2020-12-21 ENCOUNTER — Ambulatory Visit (INDEPENDENT_AMBULATORY_CARE_PROVIDER_SITE_OTHER): Payer: No Typology Code available for payment source | Admitting: Family Medicine

## 2020-12-23 ENCOUNTER — Other Ambulatory Visit (HOSPITAL_COMMUNITY): Payer: Self-pay

## 2020-12-28 ENCOUNTER — Other Ambulatory Visit (INDEPENDENT_AMBULATORY_CARE_PROVIDER_SITE_OTHER): Payer: Self-pay | Admitting: Family Medicine

## 2020-12-28 ENCOUNTER — Other Ambulatory Visit (HOSPITAL_COMMUNITY): Payer: Self-pay

## 2020-12-28 ENCOUNTER — Other Ambulatory Visit (INDEPENDENT_AMBULATORY_CARE_PROVIDER_SITE_OTHER): Payer: Self-pay

## 2020-12-28 DIAGNOSIS — R300 Dysuria: Secondary | ICD-10-CM

## 2020-12-28 DIAGNOSIS — B9689 Other specified bacterial agents as the cause of diseases classified elsewhere: Secondary | ICD-10-CM

## 2020-12-28 MED ORDER — CEPHALEXIN 500 MG PO CAPS
500.0000 mg | ORAL_CAPSULE | Freq: Three times a day (TID) | ORAL | 0 refills | Status: DC
  Filled 2020-12-28: qty 15, 5d supply, fill #0

## 2020-12-28 MED ORDER — FLUCONAZOLE 150 MG PO TABS
150.0000 mg | ORAL_TABLET | Freq: Once | ORAL | 0 refills | Status: AC
  Filled 2020-12-28: qty 2, 3d supply, fill #0

## 2020-12-29 ENCOUNTER — Other Ambulatory Visit (HOSPITAL_COMMUNITY): Payer: Self-pay

## 2020-12-31 LAB — UA/M W/RFLX CULTURE, ROUTINE
Bilirubin, UA: NEGATIVE
Glucose, UA: NEGATIVE
Ketones, UA: NEGATIVE
Nitrite, UA: NEGATIVE
Protein,UA: NEGATIVE
Specific Gravity, UA: 1.005 (ref 1.005–1.030)
Urobilinogen, Ur: 0.2 mg/dL (ref 0.2–1.0)
pH, UA: 6.5 (ref 5.0–7.5)

## 2020-12-31 LAB — URINE CULTURE, REFLEX

## 2020-12-31 LAB — MICROSCOPIC EXAMINATION
Bacteria, UA: NONE SEEN
Casts: NONE SEEN /lpf
RBC, Urine: NONE SEEN /hpf (ref 0–2)
WBC, UA: >30 /hpf — AB (ref 0–5)

## 2021-01-07 NOTE — Progress Notes (Signed)
[facility] Summary Report / Loop Recorder 

## 2021-01-12 ENCOUNTER — Telehealth: Payer: Self-pay

## 2021-01-12 NOTE — Telephone Encounter (Signed)
Eustace Pen, CMA came and verbally relayed to me that Dr. Talbert Nan will see patient at 2pm as scheduled and if patient feels like she needs to be seen sooner she will need to go to Urgent Care to be checked. I called patient and per DPR access note on file I left a detailed message in voice mail relaying this info.

## 2021-01-12 NOTE — Telephone Encounter (Signed)
Patient called complaining that she started this morning with some spotting and right sided pain that wraps around into her back. She was treated last week by PCP for UTI w 5 days of antibiotics.  The spotting started off pink and is now a brownish to dark red slimy thick discharge. She said she has IUD and has not bled in 5 years so this concerns her.  She also has some vaginal sx with itching and burning.  She talked with the appointment desk first and they scheduled her for 2pm tomorrow as a "UTI check". She is asking if anyway Dr. Talbert Nan could see her in the morning tomorros since it is Friday and if anything needs to be ordered she will have time to do it, etc.  I reminded patient she is past due for her AEX with last one 05/2019.

## 2021-01-13 ENCOUNTER — Ambulatory Visit (INDEPENDENT_AMBULATORY_CARE_PROVIDER_SITE_OTHER): Payer: No Typology Code available for payment source | Admitting: Obstetrics and Gynecology

## 2021-01-13 ENCOUNTER — Encounter: Payer: Self-pay | Admitting: Obstetrics and Gynecology

## 2021-01-13 ENCOUNTER — Other Ambulatory Visit: Payer: Self-pay

## 2021-01-13 ENCOUNTER — Other Ambulatory Visit (HOSPITAL_COMMUNITY): Payer: Self-pay

## 2021-01-13 VITALS — BP 108/72 | HR 58 | Ht 68.0 in | Wt 227.0 lb

## 2021-01-13 DIAGNOSIS — R102 Pelvic and perineal pain: Secondary | ICD-10-CM | POA: Diagnosis not present

## 2021-01-13 DIAGNOSIS — N760 Acute vaginitis: Secondary | ICD-10-CM | POA: Diagnosis not present

## 2021-01-13 DIAGNOSIS — R8290 Unspecified abnormal findings in urine: Secondary | ICD-10-CM | POA: Diagnosis not present

## 2021-01-13 DIAGNOSIS — N949 Unspecified condition associated with female genital organs and menstrual cycle: Secondary | ICD-10-CM

## 2021-01-13 DIAGNOSIS — Z113 Encounter for screening for infections with a predominantly sexual mode of transmission: Secondary | ICD-10-CM

## 2021-01-13 LAB — WET PREP FOR TRICH, YEAST, CLUE

## 2021-01-13 LAB — PREGNANCY, URINE: Preg Test, Ur: NEGATIVE

## 2021-01-13 MED ORDER — DOXYCYCLINE HYCLATE 100 MG PO CAPS
100.0000 mg | ORAL_CAPSULE | Freq: Two times a day (BID) | ORAL | 0 refills | Status: DC
  Filled 2021-01-13: qty 20, 10d supply, fill #0

## 2021-01-13 NOTE — Progress Notes (Signed)
GYNECOLOGY  VISIT   HPI: 40 y.o.   Married White or Caucasian Not Hispanic or Latino  female   [phone] with No LMP recorded. (Menstrual status: IUD).   here for urinary odor, she is having bleeding. Lower back pain and cramping and would like std testing.   She was treated for a UTI 10 days ago. Symptoms improved until 4 days ago. Now with a urine odor. No urinary frequency, urgency or dysuria.   She was treated with diflucan over the phone for itching. Helped for a day or two, then returned yesterday.  Yesterday she started having a slight bloody, slimy discharge.  She has a mirena IUD, placed in 12/17. She doesn't have cycles, only occasional brown d/c.   She just broke up with her partner, found out he cheated. She c/o a 2 day h/o mild lower abdominal cramping and mild lower back pain.   GYNECOLOGIC HISTORY: No LMP recorded. (Menstrual status: IUD). Contraception:IUD Menopausal hormone therapy: n/a        OB History     Gravida  3   Para  1   Term  1   Preterm      AB  2   Living  1      SAB  2   IAB      Ectopic      Multiple      Live Births  1              Patient Active Problem List   Diagnosis Date Noted   Acute pain of right shoulder 10/09/2020   Neck pain on right side 10/09/2020   Syncope and collapse 06/21/2020   Syncope 06/21/2020   Near syncope 11/04/2019   Hyperlipidemia associated with type 2 diabetes mellitus ([place]) 11/04/2019   Prediabetes 10/15/2019   Vitamin D deficiency 08/06/2019   Constipation 08/06/2019   Endometriosis    Candidiasis, cutaneous 07/14/2018   Flu-like symptoms 07/09/2018   Pharyngitis 07/09/2018   GAD (generalized anxiety disorder) 06/02/2018   Dysuria 03/27/2018   Family history of parkinsonism 03/27/2018   Dizziness 03/27/2018   Bradycardia 03/23/2018   HLD (hyperlipidemia) 03/23/2018   Coronary atherosclerosis 03/23/2018   Chest pain 03/22/2018   Varicose vein of leg 12/24/2017   Venous reflux  12/24/2017   Leukocytosis 09/29/2017   Nausea 09/29/2017   Anxiety 09/29/2017   Pancreatitis 09/28/2017   Dehydration 03/12/2017   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 02/19/2017   Sleep apnea 01/09/2017   Healthcare maintenance 10/18/2016   Type 2 diabetes mellitus without complication, without long-term current use of insulin ([place]) 10/18/2016   LOW BACK PAIN, ACUTE 11/20/2006   POLYCYSTIC OVARY 07/04/2006   OBESITY, NOS 07/04/2006   TOBACCO USE, QUIT 07/04/2006    Past Medical History:  Diagnosis Date   Abnormal uterine bleeding    Amenorrhea    Anemia    Anxiety    panic attacks   Back pain    Bradycardia    Chest pain    Constipation    COVID 02/2020   Diabetes ([place])    Dysmenorrhea    Endometriosis    Gallbladder problem    GERD (gastroesophageal reflux disease)    occasional - diet controlled and tums prn   HLD (hyperlipidemia)    HTN (hypertension)    IBS (irritable bowel syndrome)    Infertility, female    Insulin resistance    Migraine without aura    Missed abortion    x 2 -  both resolved without surgery   Obesity    PCOS (polycystic ovarian syndrome)    PCOS (polycystic ovarian syndrome)    PONV (postoperative nausea and vomiting)    pe rpatient ; scopalamine patch very helpful    Sleep apnea    SVD (spontaneous vaginal delivery)    x 1   Syncope and collapse 06/21/2020   Urinary incontinence    Vitamin D deficiency     Past Surgical History:  Procedure Laterality Date   CHOLECYSTECTOMY     COLONOSCOPY  2017   polyps   CYSTOSCOPY N/A 10/29/2016   Procedure: CYSTOSCOPY;  Surgeon: Salvadore Dom, MD;  Location: [facility];  Service: Gynecology;  Laterality: N/A;   DILATATION & CURRETTAGE/HYSTEROSCOPY WITH RESECTOCOPE N/A 10/17/2012   Procedure: DILATATION & CURETTAGE/HYSTEROSCOPY WITH RESECTOCOPE; Possible Polypectomy, Possible Resectoscopic Myomectomy.;  Surgeon: Floyce Stakes. Pamala Hurry, MD;  Location: [facility];  Service: Gynecology;   Laterality: N/A;  1 hr.   DILATION AND CURETTAGE OF UTERUS     ENDOMETRIAL BIOPSY     ENDOVENOUS ABLATION SAPHENOUS VEIN W/ LASER Right 05/08/2018   endovenous laser ablation right greater saphenous vein and stab phlebectomy > 20 incisions right leg by Deitra Mayo MD    ESOPHAGOGASTRODUODENOSCOPY ENDOSCOPY  2017   Normal   HYSTEROSCOPY WITH D & C N/A 12/01/2013   Procedure: IUD Removal;  Surgeon: Floyce Stakes. Pamala Hurry, MD;  Location: [facility];  Service: Gynecology;  Laterality: N/A;  90 min.   INTRAUTERINE DEVICE (IUD) INSERTION N/A 10/17/2012   Procedure: INTRAUTERINE DEVICE (IUD) INSERTION; Mirena;  Surgeon: Floyce Stakes. Pamala Hurry, MD;  Location: [facility];  Service: Gynecology;  Laterality: N/A;   LAPAROSCOPIC GASTRIC SLEEVE RESECTION N/A 02/19/2017   Procedure: LAPAROSCOPIC GASTRIC SLEEVE RESECTION, UPPER ENDOSCOPY;  Surgeon: Johnathan Hausen, MD;  Location: [facility];  Service: General;  Laterality: N/A;   LAPAROSCOPIC SALPINGO OOPHERECTOMY Left 10/29/2016   Procedure: LAPAROSCOPIC SALPINGO OOPHORECTOMY With anterior uterine biopsy;  Surgeon: Salvadore Dom, MD;  Location: [facility];  Service: Gynecology;  Laterality: Left;   LASIK  10/06/2018   LOOP RECORDER INSERTION N/A 06/22/2020   Procedure: LOOP RECORDER INSERTION;  Surgeon: Evans Lance, MD;  Location: [facility];  Service: Cardiovascular;  Laterality: N/A;   MOUTH SURGERY     wisdom teeth    Current Outpatient Medications  Medication Sig Dispense Refill   APPLE CIDER VINEGAR PO Take 1 tablet by mouth daily.     ascorbic acid (VITAMIN C) 500 MG tablet Take 500 mg by mouth daily.     Biotin w/ Vitamins C & E (HAIR/SKIN/NAILS PO) Take 1 tablet by mouth daily.      Chromium Picolinate 200 MCG CAPS Take 1 capsule by mouth daily.     clonazePAM (KLONOPIN) 0.5 MG tablet Take 1 tablet (0.5 mg total) by mouth 2 (two) times daily as needed for anxiety. 20 tablet 1   Coenzyme Q10 (COQ10) 100 MG CAPS Take 1 capsule by mouth daily.      cyclobenzaprine (FLEXERIL) 10 MG tablet Take 1 tablet (10 mg total) by mouth 2 (two) times daily as needed for muscle spasms. 30 tablet 0   Docosahexaenoic Acid (DHA COMPLETE PO) Take 1 tablet by mouth daily.     EMGALITY 120 MG/ML SOAJ Inject 120 mg into the skin every 30 (thirty) days. 1.12 mL 5   influenza vac split quadrivalent PF (FLUARIX) 0.5 ML injection TO BE ADMINISTERED BY PHARMACIST (Patient taking differently: TO BE ADMINISTERED BY PHARMACIST) .5  mL 0   levonorgestrel (MIRENA) 20 MCG/24HR IUD 1 each by Intrauterine route once.     loratadine (CLARITIN) 10 MG tablet Take 10 mg by mouth daily as needed for allergies.     metFORMIN (GLUCOPHAGE) 500 MG tablet TAKE 1 TABLET BY MOUTH EVERY MORNING. 90 tablet 0   Multiple Vitamins-Minerals (BARIATRIC MULTIVITAMINS/IRON PO) Take 1 tablet by mouth in the morning and at bedtime.     nystatin cream (MYCOSTATIN) Apply 1 application topically 2 (two) times daily. Apply to affected area BID for up to 7 days. 30 g 1   Probiotic Product (PROBIOTIC ADVANCED PO) Take 1 capsule by mouth daily.      rosuvastatin (CRESTOR) 10 MG tablet Take 1 tablet (10 mg total) by mouth daily. 30 tablet 2   Spirulina 500 MG TABS Take 1 tablet by mouth daily.     SUMAtriptan (IMITREX) 50 MG tablet TAKE 1 TABLET BY MOUTH AT ONSET OF MIGRAINE, MAY REPEAT IN 2 HOURS IF HEADACHE PERSISTS 10 tablet 0   Vitamin D, Ergocalciferol, (DRISDOL) 1.25 MG (50000 UNIT) CAPS capsule TAKE 1 CAPSULE BY MOUTH EVERY 7 DAYS 12 capsule 0   No current facility-administered medications for this visit.     ALLERGIES: Iohexol and Pyridium [phenazopyridine hcl]  Family History  Problem Relation Age of Onset   Hypertension Mother    Diabetes Mother    Depression Mother    Obesity Mother    Diabetes Father    Thyroid disease Father    Hypertension Father    Parkinson's disease Father    Dementia Father    Hyperlipidemia Father    Heart disease Father    Sleep apnea Father    Obesity  Father    Colon cancer Maternal Uncle    Breast cancer Paternal Aunt    Diabetes Maternal Grandmother    Heart disease Maternal Grandmother    Diabetes Maternal Grandfather    Heart disease Maternal Grandfather    Stroke Maternal Grandfather    Diabetes Paternal Grandmother    Diabetes Paternal Grandfather    Heart disease Paternal Grandfather    Alcohol abuse Paternal Grandfather     Social History   Socioeconomic History   Marital status: Married    Spouse name: Maithili Guida   Number of children: 1   Years of education: Not on file   Highest education level: Not on file  Occupational History   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  Other Topics Concern   Not on file  Social History Narrative   Not on file   Social Determinants of Health   Financial Resource Strain: Not on file  Food Insecurity: Not on file  Transportation Needs: Not on file  Physical Activity: Not on file  Stress: Not on file  Social Connections: Not on file  Intimate Partner Violence: Not on file    Review of Systems  Genitourinary:  Positive for  flank pain and hematuria.  All other systems reviewed and are negative.  PHYSICAL EXAMINATION:    BP 108/72   Pulse (!) 58   Ht '5\' 8"'$  (1.727 m)   Wt 227 lb (103 kg)   SpO2 99%   BMI 34.52 kg/m     General appearance: alert, cooperative and appears stated age Abdomen: soft, minimally tender in the SP region, no rebound, no guarding; non distended, no masses,  no organomegaly  Pelvic: External genitalia:  no  lesions              Urethra:  normal appearing urethra with no masses, tenderness or lesions              Bartholins and Skenes: normal                 Vagina: normal appearing vagina with normal color and discharge, no lesions              Cervix: no cervical motion tenderness, no lesions, and IUD string 3 cm              Bimanual Exam:  Uterus:   anteverted, mobile, tender, normal sized              Adnexa: no mass, fullness, tenderness              Bladder: mildly tender  Pelvic floor: not tender  Chaperone was present for exam.  1. Abnormal urine odor Recently treated for UTI, culture was negative - Urinalysis,Complete w/RFL Culture  2. Acute vaginitis - WET PREP FOR TRICH, YEAST, CLUE: negative -- SureSwab, Vaginosis/Vaginitis Plus  3. Pelvic pain Uterus is tender on exam. No CMT, no adnexal tenderness - CBC with Differential/Platelet -- SureSwab, Vaginosis/Vaginitis Plus  4. Screening examination for STD (sexually transmitted disease) - RPR - HIV Antibody (routine testing w rflx) - Hepatitis C antibody -- SureSwab, Vaginosis/Vaginitis Plus  5. Uterine tenderness Concerning for endometritis, doesn't meet criteria for PID - doxycycline (VIBRAMYCIN) 100 MG capsule; Take 1 capsule (100 mg total) by mouth 2 (two) times daily. Take BID for 10 days.  Take with food as can cause GI distress.  Dispense: 20 capsule; Refill: 0

## 2021-01-13 NOTE — Addendum Note (Signed)
Addended by: Dorothy Spark on: 01/13/2021 03:59 PM   Modules accepted: Orders

## 2021-01-15 LAB — URINALYSIS, COMPLETE W/RFL CULTURE
Casts: NONE SEEN /LPF
Crystals: NONE SEEN /HPF
Glucose, UA: NEGATIVE
Hyaline Cast: NONE SEEN /LPF
Nitrites, Initial: NEGATIVE
Specific Gravity, Urine: 1.025 (ref 1.001–1.035)
Yeast: NONE SEEN /HPF
pH: 5.5 (ref 5.0–8.0)

## 2021-01-15 LAB — URINE CULTURE
MICRO NUMBER:: 12353354
SPECIMEN QUALITY:: ADEQUATE

## 2021-01-15 LAB — CULTURE INDICATED

## 2021-01-16 ENCOUNTER — Encounter: Payer: Self-pay | Admitting: Obstetrics and Gynecology

## 2021-01-16 LAB — CBC WITH DIFFERENTIAL/PLATELET
Absolute Monocytes: 563 cells/uL (ref 200–950)
Basophils Absolute: 42 cells/uL (ref 0–200)
Basophils Relative: 0.5 %
Eosinophils Absolute: 67 cells/uL (ref 15–500)
Eosinophils Relative: 0.8 %
HCT: 41.5 % (ref 35.0–45.0)
Hemoglobin: 14.2 g/dL (ref 11.7–15.5)
Lymphs Abs: 1957 cells/uL (ref 850–3900)
MCH: 30 pg (ref 27.0–33.0)
MCHC: 34.2 g/dL (ref 32.0–36.0)
MCV: 87.6 fL (ref 80.0–100.0)
MPV: 9.1 fL (ref 7.5–12.5)
Monocytes Relative: 6.7 %
Neutro Abs: 5771 cells/uL (ref 1500–7800)
Neutrophils Relative %: 68.7 %
Platelets: 285 10*3/uL (ref 140–400)
RBC: 4.74 10*6/uL (ref 3.80–5.10)
RDW: 12.3 % (ref 11.0–15.0)
Total Lymphocyte: 23.3 %
WBC: 8.4 10*3/uL (ref 3.8–10.8)

## 2021-01-16 LAB — HEPATITIS C ANTIBODY
Hepatitis C Ab: NONREACTIVE
SIGNAL TO CUT-OFF: 0.02 (ref <1.00)

## 2021-01-16 LAB — HIV ANTIBODY (ROUTINE TESTING W REFLEX): HIV 1&2 Ab, 4th Generation: NONREACTIVE

## 2021-01-16 LAB — RPR: RPR Ser Ql: NONREACTIVE

## 2021-01-17 ENCOUNTER — Other Ambulatory Visit (HOSPITAL_COMMUNITY): Payer: Self-pay

## 2021-01-17 ENCOUNTER — Other Ambulatory Visit: Payer: Self-pay

## 2021-01-17 ENCOUNTER — Ambulatory Visit (INDEPENDENT_AMBULATORY_CARE_PROVIDER_SITE_OTHER): Payer: No Typology Code available for payment source | Admitting: Medical

## 2021-01-17 ENCOUNTER — Encounter: Payer: Self-pay | Admitting: Medical

## 2021-01-17 VITALS — BP 120/70 | HR 52 | Ht 68.5 in | Wt 230.4 lb

## 2021-01-17 DIAGNOSIS — R55 Syncope and collapse: Secondary | ICD-10-CM | POA: Diagnosis not present

## 2021-01-17 DIAGNOSIS — Z6379 Other stressful life events affecting family and household: Secondary | ICD-10-CM

## 2021-01-17 DIAGNOSIS — Z9884 Bariatric surgery status: Secondary | ICD-10-CM

## 2021-01-17 DIAGNOSIS — I2510 Atherosclerotic heart disease of native coronary artery without angina pectoris: Secondary | ICD-10-CM

## 2021-01-17 DIAGNOSIS — Z8669 Personal history of other diseases of the nervous system and sense organs: Secondary | ICD-10-CM | POA: Insufficient documentation

## 2021-01-17 DIAGNOSIS — F419 Anxiety disorder, unspecified: Secondary | ICD-10-CM | POA: Diagnosis not present

## 2021-01-17 DIAGNOSIS — I2583 Coronary atherosclerosis due to lipid rich plaque: Secondary | ICD-10-CM

## 2021-01-17 DIAGNOSIS — E1169 Type 2 diabetes mellitus with other specified complication: Secondary | ICD-10-CM

## 2021-01-17 DIAGNOSIS — R7301 Impaired fasting glucose: Secondary | ICD-10-CM | POA: Insufficient documentation

## 2021-01-17 DIAGNOSIS — E785 Hyperlipidemia, unspecified: Secondary | ICD-10-CM

## 2021-01-17 LAB — SURESWAB, VAGINOSIS/VAGINITIS PLUS
Atopobium vaginae: NOT DETECTED Log (cells/mL)
BV CATEGORY: UNDETERMINED — AB
C. albicans, DNA: DETECTED — AB
C. glabrata, DNA: DETECTED — AB
C. parapsilosis, DNA: NOT DETECTED
C. trachomatis RNA, TMA: NOT DETECTED
C. tropicalis, DNA: NOT DETECTED
Gardnerella vaginalis: 6 Log (cells/mL)
LACTOBACILLUS SPECIES: 7.3 Log (cells/mL)
MEGASPHAERA SPECIES: NOT DETECTED Log (cells/mL)
N. gonorrhoeae RNA, TMA: NOT DETECTED
Trichomonas vaginalis RNA: NOT DETECTED

## 2021-01-17 MED ORDER — FREESTYLE LIBRE 2 SENSOR MISC
1.0000 | 4 refills | Status: DC
  Filled 2021-01-17: qty 2, 28d supply, fill #0

## 2021-01-17 NOTE — Telephone Encounter (Signed)
Dr.Jertson please see patient message regarding Flagyl Rx.

## 2021-01-17 NOTE — Patient Instructions (Signed)
Talk with cardiology Friday about coronary artery disease, finding on scan about pulmonary hypertension question, and whether you should have MRA brain and neck?   Call Employee Assistant Program today to get started with counseling   We will call with lab results

## 2021-01-17 NOTE — Progress Notes (Signed)
Subjective: Chief Complaint  Patient presents with   new pt    New pt get established. Seeing cardiology for syncope episodes. No other concerns. Will get flu shot through work    Medical team:  Dr. Juleen China, health weight and wellness Dr. Buford Dresser , cardiology [facility] Formerly Dr. Cira Servant, prior PCP in [facility] area Dr. Asencion Partridge Dohmeier Dr. Sumner Boast, gyn  Here as a new patient.  Wants to establish care.    She hsa been having syncope episodes since 06/2020.  The first episode was June 21, 2020.  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  She was taken by EMS to the hospital.  The next day she had a loop implant recorder by cardiology to track abnormal rhythms.  She still has this in place.  She has had recurrent problems with the syncopal episodes for months.  She has missed work numerous times because of this.  She does not drive most of the time.  When she gets the episodes, she note feeling dizzy, low heart rate, fatigue, tunnel vision, ears will start ringing episodes, and gets bad headaches within minutes to hours after and episodes. Gets these episodes on average once a weekly since 06/2020.  This has been interfering with work.     Having handfuls of hair loss x 7- 8 months.  Hx/o gastric sleeve surgery  Nonsmoker, occasional alcohol use, no drugs  In the past was over 300 pounds.  She has lost weight over time.  She was on CPAP in the past.  She was able to come off the CPAP and weight loss but she has updated sleep study next week  She sees cardiology again this Friday.  She takes metformin for insulin resistance and PCOS.  She is prediabetic.  She has a history of anxiety.  She uses Klonopin on average about once every 2 weeks.  This is prescribed by Dr. Juleen China  She has never seen counseling.  She works at [employer] and wellness.  She also has a part-time job with Forensic scientist.  She has a 40 year old  son who is still trying to figure out college or work.  She is separated from husband x a year.   Past Medical History:  Diagnosis Date   Abnormal uterine bleeding    Amenorrhea    Anemia    Anxiety    panic attacks   Back pain    Bradycardia    Chest pain    Constipation    COVID 02/2020   Diabetes ([place])    Dysmenorrhea    Endometriosis    Gallbladder problem    GERD (gastroesophageal reflux disease)    occasional - diet controlled and tums prn   HLD (hyperlipidemia)    HTN (hypertension)    IBS (irritable bowel syndrome)    Infertility, female    Insulin resistance    Migraine without aura    Missed abortion    x 2 - both resolved without surgery   Obesity    PCOS (polycystic ovarian syndrome)    PCOS (polycystic ovarian syndrome)    PONV (postoperative nausea and vomiting)    pe rpatient ; scopalamine patch very helpful    Sleep apnea    SVD (spontaneous vaginal delivery)    x 1   Syncope and collapse 06/21/2020   Urinary incontinence    Vitamin D deficiency    Current Outpatient Medications on File Prior to Visit  Medication Sig  Dispense Refill   APPLE CIDER VINEGAR PO Take 1 tablet by mouth daily.     ascorbic acid (VITAMIN C) 500 MG tablet Take 500 mg by mouth daily.     Biotin w/ Vitamins C & E (HAIR/SKIN/NAILS PO) Take 1 tablet by mouth daily.      Chromium Picolinate 200 MCG CAPS Take 1 capsule by mouth daily.     clonazePAM (KLONOPIN) 0.5 MG tablet Take 1 tablet (0.5 mg total) by mouth 2 (two) times daily as needed for anxiety. 20 tablet 1   Coenzyme Q10 (COQ10) 100 MG CAPS Take 1 capsule by mouth daily.     Docosahexaenoic Acid (DHA COMPLETE PO) Take 1 tablet by mouth daily.     doxycycline (VIBRAMYCIN) 100 MG capsule Take 1 capsule by mouth 2 times daily for 10 days with food. 20 capsule 0   levonorgestrel (MIRENA) 20 MCG/24HR IUD 1 each by Intrauterine route once.     loratadine (CLARITIN) 10 MG tablet Take 10 mg by mouth daily as needed for allergies.      metFORMIN (GLUCOPHAGE) 500 MG tablet TAKE 1 TABLET BY MOUTH EVERY MORNING. 90 tablet 0   Multiple Vitamins-Minerals (BARIATRIC MULTIVITAMINS/IRON PO) Take 1 tablet by mouth in the morning and at bedtime.     nystatin cream (MYCOSTATIN) Apply 1 application topically 2 (two) times daily. Apply to affected area BID for up to 7 days. 30 g 1   Probiotic Product (PROBIOTIC ADVANCED PO) Take 1 capsule by mouth daily.      rosuvastatin (CRESTOR) 10 MG tablet Take 1 tablet (10 mg total) by mouth daily. 30 tablet 2   Spirulina 500 MG TABS Take 1 tablet by mouth daily.     SUMAtriptan (IMITREX) 50 MG tablet TAKE 1 TABLET BY MOUTH AT ONSET OF MIGRAINE, MAY REPEAT IN 2 HOURS IF HEADACHE PERSISTS 10 tablet 0   Vitamin D, Ergocalciferol, (DRISDOL) 1.25 MG (50000 UNIT) CAPS capsule TAKE 1 CAPSULE BY MOUTH EVERY 7 DAYS 12 capsule 0   cyclobenzaprine (FLEXERIL) 10 MG tablet Take 1 tablet (10 mg total) by mouth 2 (two) times daily as needed for muscle spasms. (Patient not taking: Reported on 01/17/2021) 30 tablet 0   No current facility-administered medications on file prior to visit.   ROS as in subjective   Objective: BP 120/70   Pulse (!) 52   Ht 5' 8.5" (1.74 m)   Wt 230 lb 6.4 oz (104.5 kg)   BMI 34.52 kg/m   General appearence: alert, no distress, WD/WN, white female HEENT: normocephalic, sclerae anicteric, TMs pearly, nares patent, no discharge or erythema, pharynx normal Oral cavity: MMM, no lesions Neck: supple, no lymphadenopathy, no thyromegaly, no masses, no bruits Heart: bradycardia, otherwise RRR, normal S1, S2, no murmurs Lungs: CTA bilaterally, no wheezes, rhonchi, or rales Pulses: 2+ symmetric, upper and lower extremities, normal cap refill Neuro: CN II through XII intact, nonfocal exam, answers questions appropriately Psych: Pleasant, good eye contact   Assessment: Encounter Diagnoses  Name Primary?   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  I reviewed recent neurology and cardiology notes.  She has a sleep study repeat coming up next week.  She formally was on CPAP and at one point was able to come off CPAP.  However she has been sent by neurology for recheck to see if she still needs to go back on CPAP or not  She has follow-up with cardiology this Friday.  She will discuss with them further some of her recent scan showing CAD, possible pulmonary hypertension, and recommendations.  We discussed the need to stay on statin given atherosclerosis findings.  She was recently started on this by cardiology  We discussed the potential for anxiety playing a role.  Seizures has been ruled out.  I strongly advise she call employee assistance program to start counseling ASAP.  She has several life stressors including being with the separation for the past year, working 2 jobs, financial stress, emotional stress.  She assures me she will check in the counseling.  Given her history of gastric bypass we will check some vitamin levels today in case that is playing a role.  I prescribed a freestyle libre to help her check her blood sugars to rule out hypoglycemic episodes.  She does have a history of prediabetes    Laprincess was seen today for new pt.  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  F/u pending labs

## 2021-01-18 ENCOUNTER — Other Ambulatory Visit (HOSPITAL_COMMUNITY): Payer: Self-pay

## 2021-01-18 LAB — FOLATE: Folate: 15.8 ng/mL (ref >3.0)

## 2021-01-18 LAB — VITAMIN B12: Vitamin B-12: 354 pg/mL (ref 232–1245)

## 2021-01-18 LAB — SEDIMENTATION RATE: Sed Rate: 3 mm/hr (ref 0–32)

## 2021-01-19 ENCOUNTER — Other Ambulatory Visit: Payer: Self-pay | Admitting: *Deleted

## 2021-01-19 ENCOUNTER — Other Ambulatory Visit (HOSPITAL_COMMUNITY): Payer: Self-pay

## 2021-01-19 MED ORDER — FLUCONAZOLE 150 MG PO TABS
ORAL_TABLET | ORAL | 0 refills | Status: DC
Start: 2021-01-19 — End: 2021-03-07
  Filled 2021-01-19: qty 2, 1d supply, fill #0

## 2021-01-19 MED ORDER — METRONIDAZOLE 500 MG PO TABS
500.0000 mg | ORAL_TABLET | Freq: Two times a day (BID) | ORAL | 0 refills | Status: DC
  Filled 2021-01-19: qty 14, 7d supply, fill #0

## 2021-01-20 ENCOUNTER — Ambulatory Visit (HOSPITAL_BASED_OUTPATIENT_CLINIC_OR_DEPARTMENT_OTHER): Payer: No Typology Code available for payment source | Admitting: Cardiology

## 2021-01-23 ENCOUNTER — Ambulatory Visit (INDEPENDENT_AMBULATORY_CARE_PROVIDER_SITE_OTHER): Payer: No Typology Code available for payment source | Admitting: Adult Health

## 2021-01-23 ENCOUNTER — Ambulatory Visit (INDEPENDENT_AMBULATORY_CARE_PROVIDER_SITE_OTHER): Payer: No Typology Code available for payment source

## 2021-01-23 ENCOUNTER — Telehealth: Payer: Self-pay | Admitting: Cardiology

## 2021-01-23 DIAGNOSIS — R55 Syncope and collapse: Secondary | ICD-10-CM

## 2021-01-23 LAB — CUP PACEART REMOTE DEVICE CHECK
Date Time Interrogation Session: 20220918031305
Implantable Pulse Generator Implant Date: 20220216

## 2021-01-23 NOTE — Telephone Encounter (Signed)
Spoke to patient stated she has been having dizziness and slow heart beat.Stated a down load is being done on her loop recorder.She was unable to work today.Stated appointment rescheduled with Dr.Christopher 03/21/21.She would like a sooner appointment.Appointment scheduled with Laurann Montana PA 9/27 at 11:00 am at [facility].

## 2021-01-23 NOTE — Telephone Encounter (Signed)
Pt would like to let Dr. Harrell Gave know that she missed her appt because she woke up with a low hr and dizzy spell that started at 4am. No callback needed

## 2021-01-24 ENCOUNTER — Other Ambulatory Visit (HOSPITAL_COMMUNITY): Payer: Self-pay

## 2021-01-25 ENCOUNTER — Ambulatory Visit (INDEPENDENT_AMBULATORY_CARE_PROVIDER_SITE_OTHER): Payer: No Typology Code available for payment source | Admitting: Neurology

## 2021-01-25 DIAGNOSIS — R42 Dizziness and giddiness: Secondary | ICD-10-CM

## 2021-01-25 DIAGNOSIS — G4719 Other hypersomnia: Secondary | ICD-10-CM

## 2021-01-25 DIAGNOSIS — R55 Syncope and collapse: Secondary | ICD-10-CM

## 2021-01-25 DIAGNOSIS — G4710 Hypersomnia, unspecified: Secondary | ICD-10-CM

## 2021-01-25 DIAGNOSIS — R001 Bradycardia, unspecified: Secondary | ICD-10-CM

## 2021-01-25 DIAGNOSIS — G43101 Migraine with aura, not intractable, with status migrainosus: Secondary | ICD-10-CM

## 2021-01-30 NOTE — Progress Notes (Signed)
[facility] Summary Report / Loop Recorder 

## 2021-01-30 NOTE — Progress Notes (Signed)
[facility] TEST REPORT ( by Watch PAT)   STUDY DATE:  01-26-2021 DOB:  1981/01/03 MRN: [id number]   ORDERING CLINICIAN: Larey Seat, MD  REFERRING CLINICIAN: Dr Rayann Heman, MD PCP Dr Joycelyn Rua, MD   CLINICAL INFORMATION/HISTORY: ANALILIA GEDDIS is a 40 y.o. female and seen here as in a new referral from Cardiology.  The patient works in a healthcare setting and had originally seen me over 3 years ago for a sleep apnea evaluation at the time she was preparing for gastric sleeve procedure with Dr. Johnathan Hausen. This surgery was performed on 10-16- 2018 and she lost 120 pounds, did not need her CPAP any longer after that.  In February of this year she developed syncopal spells.  A loop recorder was implanted and documented bradycardia. There is some weight regain.  She does have headaches following such spells and the incidence of near syncope spells have become very frequent at least 3 times a week or more.  Headaches are frontal - throbbing,  a feeling of pressure- behind the eyes.  one time she fainted by checking in a patient at the hospital.  So there is a significant impairment and she had to give up driving because she felt that her car was in motion but actually parked or standing.  None of her spells has been associated with loss of bowel or bladder control, there has been no tonic-clonic activity no tongue bite.  No injuries.  She does have a little feeling of chest tightness rather than chest pain and a sensation of vertigo moving while not actually in motion. She hydrates well.     Epworth sleepiness score: 10/24.   BMI: 34.7kg/m   Neck Circumference: 15   FINDINGS:   Sleep Summary:   Total Recording Time (hours, min): The overall recording time was 6 hours and 47 minutes of which 6 hours and 7 minutes were calculated sleep time.     Percent REM (%):  The percentage of REM sleep was 27.4.                                         Respiratory  Indices:   Calculated pAHI (per hour): Overall apnea hypopnea index was 3.3/h during REM sleep slight increase to 4.8/h and in non-REM sleep AHI was 2.7/h.  In supine sleep the AHI was 4.3/h versus nonsupine 1.1/h.  Snoring level was mild to moderate with a mean volume of 40 dB.  Snoring was only present for about 19% of the recorded sleep time.  There was no evidence of hypoxia.  The heart rate varied between a minimum of 42 bpm and a maximum of 97 bpm with a mean heart rate of 55 bpm.                              IMPRESSION:  This HST did not detect any obstructive sleep apnea to be still present.  There was no significant pulse frequency abnormality, there was also no significant hypoxia.  Snoring was considered mild to moderate. Sleep was fragmented.   RECOMMENDATION: no intervention needed.     INTERPRETING PHYSICIAN:   Larey Seat, MD   Medical Director of [facility] at [facility].

## 2021-01-31 ENCOUNTER — Other Ambulatory Visit (HOSPITAL_COMMUNITY): Payer: Self-pay

## 2021-01-31 ENCOUNTER — Ambulatory Visit (INDEPENDENT_AMBULATORY_CARE_PROVIDER_SITE_OTHER): Payer: No Typology Code available for payment source | Admitting: Family

## 2021-01-31 ENCOUNTER — Encounter (HOSPITAL_BASED_OUTPATIENT_CLINIC_OR_DEPARTMENT_OTHER): Payer: Self-pay | Admitting: Family

## 2021-01-31 ENCOUNTER — Other Ambulatory Visit: Payer: Self-pay

## 2021-01-31 VITALS — BP 116/68 | HR 64 | Ht 68.0 in | Wt 233.8 lb

## 2021-01-31 DIAGNOSIS — E785 Hyperlipidemia, unspecified: Secondary | ICD-10-CM

## 2021-01-31 DIAGNOSIS — R001 Bradycardia, unspecified: Secondary | ICD-10-CM

## 2021-01-31 DIAGNOSIS — R42 Dizziness and giddiness: Secondary | ICD-10-CM

## 2021-01-31 DIAGNOSIS — I951 Orthostatic hypotension: Secondary | ICD-10-CM

## 2021-01-31 DIAGNOSIS — R55 Syncope and collapse: Secondary | ICD-10-CM | POA: Diagnosis not present

## 2021-01-31 DIAGNOSIS — I2510 Atherosclerotic heart disease of native coronary artery without angina pectoris: Secondary | ICD-10-CM | POA: Diagnosis not present

## 2021-01-31 MED ORDER — MIDODRINE HCL 2.5 MG PO TABS
2.5000 mg | ORAL_TABLET | Freq: Three times a day (TID) | ORAL | 1 refills | Status: DC
  Filled 2021-01-31: qty 90, 30d supply, fill #0

## 2021-01-31 NOTE — Progress Notes (Signed)
Office Visit    Patient Name: [name] [name] Date of Encounter: 01/31/2021  PCP:  [name] [name]   [place]  Cardiologist:  [name] Dresser, MD  Advanced Practice Provider:  No care team member to display Electrophysiologist:  None    Chief Complaint    [name] [name] is a 40 y.o. female with a hx of chronic dizziness/presyncope/syncope, PCOS, prior bariatric surgery, former tobacco use, COVID-19, Nonobstructive CAD presents today for dizziness.  Past Medical History    Past Medical History:  Diagnosis Date   Abnormal uterine bleeding    Amenorrhea    Anemia    Anxiety    panic attacks   Back pain    Bradycardia    Chest pain    Constipation    COVID 02/2020   Diabetes ([place])    Dysmenorrhea    Endometriosis    Gallbladder problem    GERD (gastroesophageal reflux disease)    occasional - diet controlled and tums prn   HLD (hyperlipidemia)    HTN (hypertension)    IBS (irritable bowel syndrome)    Infertility, female    Insulin resistance    Migraine without aura    Missed abortion    x 2 - both resolved without surgery   Obesity    PCOS (polycystic ovarian syndrome)    PCOS (polycystic ovarian syndrome)    PONV (postoperative nausea and vomiting)    pe rpatient ; scopalamine patch very helpful    Sleep apnea    SVD (spontaneous vaginal delivery)    x 1   Syncope and collapse 06/21/2020   Urinary incontinence    Vitamin D deficiency    Past Surgical History:  Procedure Laterality Date   CHOLECYSTECTOMY     COLONOSCOPY  2017   polyps   CYSTOSCOPY N/A 10/29/2016   Procedure: CYSTOSCOPY;  Surgeon: Salvadore Dom, MD;  Location: [facility];  Service: Gynecology;  Laterality: N/A;   DILATATION & CURRETTAGE/HYSTEROSCOPY WITH RESECTOCOPE N/A 10/17/2012   Procedure: DILATATION & CURETTAGE/HYSTEROSCOPY WITH RESECTOCOPE; Possible Polypectomy, Possible Resectoscopic Myomectomy.;  Surgeon: Floyce Stakes. Pamala Hurry, MD;   Location: [facility];  Service: Gynecology;  Laterality: N/A;  1 hr.   DILATION AND CURETTAGE OF UTERUS     ENDOMETRIAL BIOPSY     ENDOVENOUS ABLATION SAPHENOUS VEIN W/ LASER Right 05/08/2018   endovenous laser ablation right greater saphenous vein and stab phlebectomy > 20 incisions right leg by Deitra Mayo MD    ESOPHAGOGASTRODUODENOSCOPY ENDOSCOPY  2017   Normal   HYSTEROSCOPY WITH D & C N/A 12/01/2013   Procedure: IUD Removal;  Surgeon: Floyce Stakes. Pamala Hurry, MD;  Location: [facility];  Service: Gynecology;  Laterality: N/A;  90 min.   INTRAUTERINE DEVICE (IUD) INSERTION N/A 10/17/2012   Procedure: INTRAUTERINE DEVICE (IUD) INSERTION; Mirena;  Surgeon: Floyce Stakes. Pamala Hurry, MD;  Location: [facility];  Service: Gynecology;  Laterality: N/A;   LAPAROSCOPIC GASTRIC SLEEVE RESECTION N/A 02/19/2017   Procedure: LAPAROSCOPIC GASTRIC SLEEVE RESECTION, UPPER ENDOSCOPY;  Surgeon: Johnathan Hausen, MD;  Location: [facility];  Service: General;  Laterality: N/A;   LAPAROSCOPIC SALPINGO OOPHERECTOMY Left 10/29/2016   Procedure: LAPAROSCOPIC SALPINGO OOPHORECTOMY With anterior uterine biopsy;  Surgeon: Salvadore Dom, MD;  Location: [facility];  Service: Gynecology;  Laterality: Left;   LASIK  10/06/2018   LOOP RECORDER INSERTION N/A 06/22/2020   Procedure: LOOP RECORDER INSERTION;  Surgeon: Evans Lance, MD;  Location: [facility];  Service: Cardiovascular;  Laterality: N/A;   MOUTH SURGERY     wisdom teeth    Allergies  Allergies  Allergen Reactions   Iohexol Hives     Code: HIVES, Desc: *NEED 13 HR PREDISONE AND BENADRYL  BE FOR IV CONTRAST GIVEN* /DR OLSON PT DEVELOPED HIVES AND ROOF OF MOUTH BEGIN "ITCHING"", Onset Date: [id number]    Pyridium [Phenazopyridine Hcl] Other (See Comments)    vaginitis symptoms when on it    History of Present Illness    [name] is a 40 y.o. female with a hx of chronic dizziness/presyncope/syncope, PCOS, prior bariatric surgery, former tobacco use, COVID-19,  Nonobstructive CAD last seen 12/06/2020  She will previous event monitor December 2019 and more recent July 2021 with no significant pauses or arrhythmia.  Symptoms did not correlate with arrhythmia.  Orthostatics June 2021 with appropriate elevation of heart rate without drop in blood pressure.  She does have a loop recorder in place.  When last seen June 2022 she noted she was continuing to drink Gatorade and water, increasing salt intake, and wearing compression socks.  She was trialed on Florinef but after 3 days became nauseous and could not tolerate.  Seen 12/06/2020 for follow-up.  She noted continued dizziness.  She was drinking between 64 and 80 ounce of fluid per her report.  She was adding salt.  She was not wearing compression stockings.  She was having family as well as financial stressors.  Her orthostatic vitals were negative with no change in blood pressure with position changes.  Case discussed with Dr. Harrell Gave and given no significant cardiac abnormality even by loop recorder reports discussed with her that we were unable to provide disability or leave of absence.  Her FMLA paperwork was updated to include 3 days/month for symptoms and office visits.  Lab work 09/23/20 TSH 1.000, A1c 5.7, Hb 14.4, K 4.1, AST 14, ALT 12  Evaluated 12/12/2020 by neurology.  Vertigo was easily provoked in clinic with rapid head movements.  She was recommended for repeat sleep study. She had orthostatic vital signs which were positive.   She establish care with Chana Bode, PA 9-13/22.  She was recommended to seek employee assistance program counseling.  Her folate, B12, sed rate were unremarkable.  She was given freestyle libre to check blood sugars to rule out hypoglycemic episodes.  Remote loop recorder report 01/22/2021 with no tachycardia, bradycardia, or pause episodes.  She presents today for follow-up. She is helping her mother in law with some appointments regarding her newly diagnosed lung  cancer. Her father was also recently diagnosed with prostate cancer which is understandably stressful, offered my condolences. Notes her "heart episodes" are more frequent with increased stress. She reports feeling of her "heart jumping up into her throat" that will happen at night. Discussed likely etiology of PVCs. Not associated with pain nor dyspnea. Also notes sitting on the edge of her bed getting up in the middle of the night and seeing black spots and having to crouch down to regain composure.   EKGs/Labs/Other Studies Reviewed:   The following studies were reviewed today:  [place] LE Venous 08/26/2020: Summary:  Right:  - No evidence of deep vein thrombosis from the common femoral through the  popliteal veins.  - No evidence of superficial venous thrombosis.  - The common femoral vein is not competent.  - The great saphenous vein is not competent at the knee and proximal calf.  - The small saphenous vein is competent.  Left:  - No evidence of deep vein thrombosis from the common femoral through the  popliteal veins.  - No evidence of superficial venous thrombosis.  - The deep venous system is not competent.  - The great saphenous vein is ablated proximally and competent distally.  - The small saphenous vein is not competent.   Echo 06/22/2020: 1. Left ventricular ejection fraction, by estimation, is 60 to 65%. The  left ventricle has normal function. The left ventricle has no regional  wall motion abnormalities. There is mild left ventricular hypertrophy.  Left ventricular diastolic parameters  were normal.   2. Right ventricular systolic function is normal. The right ventricular  size is normal. Tricuspid regurgitation signal is inadequate for assessing  PA pressure.   3. The mitral valve is normal in structure. No evidence of mitral valve  regurgitation.   4. The aortic valve was not well visualized. Aortic valve regurgitation  is not visualized. No aortic stenosis is  present.   5. The inferior vena cava is normal in size with greater than 50%  respiratory variability, suggesting right atrial pressure of 3 mmHg.   Loop Recorder Insertion 06/22/2020: CONCLUSIONS:  1. Successful implantation of a Medtronic Reveal LINQ implantable loop recorder for unexplained syncope  2. No early apparent complications.    Monitor 11/23/2019: 12 days of data recorded on Zio monitor. Patient had a min HR of 37 bpm, max HR of 180 bpm, and avg HR of 67 bpm. Predominant underlying rhythm was Sinus Rhythm. No VT, SVT, atrial fibrillation, high degree block, or pauses noted. Isolated atrial and ventricular ectopy was rare (<1%). There were 91 triggered events. These were all sinus, with occasional isolate PAC or PVC. No significant arrhythmias detected.   Monitor 04/21/18 NSR Rare PAC/PVC s Symptoms do not correlate with arrhythmia Average HR 69 bpm   CT coronary 03/23/18 Coronary Arteries:  Normal coronary origin.  Right dominance.   RCA is a large dominant artery that gives rise to PDA and PLVB. There is no plaque.   Left main is a large artery that gives rise to LAD and LCX arteries. Left main has no plaque.   LAD is a large vessel that gives rise to two diagonal arteries. Proximal LAD has mild calcified plaque with associated stenosis 0-25%. Mid LAD has a mild non-calcified plaque with associated stenosis 0-25%.   LCX is a small non-dominant artery that gives rise to one small OM1 branch. There is no plaque.   Other findings:   Normal pulmonary vein drainage into the left atrium.   Normal let atrial appendage without a thrombus.   IMPRESSION: 1. Coronary calcium score of 46. This was 66 percentile for age and sex matched control.   2. Normal coronary origin with right dominance.   3. 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 Aggressive medical management is recommended.   4. Mildly dilated pulmonary artery measuring 32 mm suggestive  of pulmonary hypertension.    EKG:  No EKG today.  Recent Labs: 06/21/2020: Magnesium 2.0 09/23/2020: ALT 12; BUN 13; Creatinine, Ser 0.90; Potassium 4.1; Sodium 140; TSH 1.000 01/13/2021: Hemoglobin 14.2; Platelets 285  Recent Lipid Panel    Component Value Date/Time   CHOL 205 (H) 08/18/2020 1015   TRIG 65 08/18/2020 1015   HDL 49 08/18/2020 1015   CHOLHDL 4.2 08/18/2020 1015   CHOLHDL 3.5 03/23/2018 0623   VLDL 8 03/23/2018 0623   LDLCALC 144 (H) 08/18/2020 1015   LDLDIRECT 141 (H) 09/23/2020  1130    Home Medications   Current Meds  Medication Sig   APPLE CIDER VINEGAR PO Take 1 tablet by mouth daily.   ascorbic acid (VITAMIN C) 500 MG tablet Take 500 mg by mouth daily.   Biotin w/ Vitamins C & E (HAIR/SKIN/NAILS PO) Take 1 tablet by mouth daily.    Chromium Picolinate 200 MCG CAPS Take 1 capsule by mouth daily.   clonazePAM (KLONOPIN) 0.5 MG tablet Take 1 tablet (0.5 mg total) by mouth 2 (two) times daily as needed for anxiety.   Coenzyme Q10 (COQ10) 100 MG CAPS Take 1 capsule by mouth daily.   Continuous Blood Gluc Sensor (FREESTYLE LIBRE 2 SENSOR) MISC APPLY ONE SENSOR EVERY 14 DAYS.   cyclobenzaprine (FLEXERIL) 10 MG tablet Take 1 tablet (10 mg total) by mouth 2 (two) times daily as needed for muscle spasms.   Docosahexaenoic Acid (DHA COMPLETE PO) Take 1 tablet by mouth daily.   doxycycline (VIBRAMYCIN) 100 MG capsule Take 1 capsule by mouth 2 times daily for 10 days with food.   fluconazole (DIFLUCAN) 150 MG tablet Take one tablet by mouth now, repeat in 72 hours if symptoms have not resolved.   levonorgestrel (MIRENA) 20 MCG/24HR IUD 1 each by Intrauterine route once.   loratadine (CLARITIN) 10 MG tablet Take 10 mg by mouth daily as needed for allergies.   metFORMIN (GLUCOPHAGE) 500 MG tablet TAKE 1 TABLET BY MOUTH EVERY MORNING.   metroNIDAZOLE (FLAGYL) 500 MG tablet Take 1 tablet (500 mg total) by mouth 2 (two) times daily.   Multiple Vitamins-Minerals (BARIATRIC  MULTIVITAMINS/IRON PO) Take 1 tablet by mouth in the morning and at bedtime.   nystatin cream (MYCOSTATIN) Apply 1 application topically 2 (two) times daily. Apply to affected area BID for up to 7 days.   Probiotic Product (PROBIOTIC ADVANCED PO) Take 1 capsule by mouth daily.    rosuvastatin (CRESTOR) 10 MG tablet Take 1 tablet (10 mg total) by mouth daily.   Spirulina 500 MG TABS Take 1 tablet by mouth daily.   SUMAtriptan (IMITREX) 50 MG tablet TAKE 1 TABLET BY MOUTH AT ONSET OF MIGRAINE, MAY REPEAT IN 2 HOURS IF HEADACHE PERSISTS   Vitamin D, Ergocalciferol, (DRISDOL) 1.25 MG (50000 UNIT) CAPS capsule TAKE 1 CAPSULE BY MOUTH EVERY 7 DAYS     Review of Systems      All other systems reviewed and are otherwise negative except as noted above.  Physical Exam    VS:  BP 116/68   Pulse 64   Ht 5\' 8"  (1.727 m)   Wt 233 lb 12.8 oz (106.1 kg)   SpO2 98%   BMI 35.55 kg/m  , BMI Body mass index is 35.55 kg/m.  Wt Readings from Last 3 Encounters:  01/31/21 233 lb 12.8 oz (106.1 kg)  01/17/21 230 lb 6.4 oz (104.5 kg)  01/13/21 227 lb (103 kg)    GEN: Well nourished, well developed, in no acute distress. HEENT: normal. Neck: Supple, no JVD, carotid bruits, or masses. Cardiac: RRR, no murmurs, rubs, or gallops. No clubbing, cyanosis, edema.  Radials/PT 2+ and equal bilaterally.  Respiratory:  Respirations regular and unlabored, clear to auscultation bilaterally. GI: Soft, nontender, nondistended. MS: No deformity or atrophy. Skin: Warm and dry, no rash. Neuro:  Strength and sensation are intact. Psych: Normal affect.  Assessment & Plan    Near-syncope / Sinus bradycardia / Orthostatic hypotension / Dizziness  - Previous event monitors and loop recorder with no significant arrhythmia. Suspicion for  vagal/autonomic etiology. Previously intolerant of Florinef with nausea. 11/06/20 loop recorder report with 2 episodes showing SR/ST. 01/2021 loop recorder report with no significant arrhythmia.   She did have recent visit with neurology with positive orthostatic vital signs were previously were negative.  As such, start midodrine 2.5 mg 3 times daily with meals with prompt follow-up to assess response.  Her primary care mentioned possible MRA to evaluate carotid arteries, will discuss with Dr. Harrell Gave whether to proceed with MRA vs carotid duplex.    Nonobstructive CAD - Cardiac CTA 03/2018 mild nonobstructive CAD in prox and mid LAD, coronary calcium score of 46 (99th percentile). Stable with no anginal symptoms. No indication for ischemic evaluation.  No beta-blocker due to baseline bradycardia.  GDMT includes statin, as below.   HLD, LDL goal <70 - 09/23/20 LDL 141. Recently started on rosuvastatin 10 mg daily.  Lab slips provided for repeat CMP, lipid panel as she has completed 6 weeks of therapy that will not collect today as she has eaten already.  If LDL not at goal of less than 70 plan to increase dose of rosuvastatin.    Disposition: Follow up 03/21/21 with Dr. Harrell Gave as scheduled.  Signed, Loel Dubonnet, NP 01/31/2021, 11:03 AM [place]

## 2021-01-31 NOTE — Patient Instructions (Addendum)
Medication Instructions:  Your physician has recommended you make the following change in your medication:   START Midodrine one 2.5 mg tablet three times per day with meals   *If you need a refill on your cardiac medications before your next appointment, please call your pharmacy*  Lab Work: Your physician recommends that you return for lab work this week or next: fasting lipid panel and CMP  If you have labs (blood work) drawn today and your tests are completely normal, you will receive your results only by: [place] (if you have MyChart) OR A paper copy in the mail If you have any lab test that is abnormal or we need to change your treatment, we will call you to review the results.  Testing/Procedures: Your recent loop recorder report looked good!  Loel Dubonnet, NP will talk with Dr. Harrell Gave about whether to pursue a carotid duplex or MRA of your carotid arteries to assess.  Follow-Up: At [facility], you and your health needs are our priority.  As part of our continuing mission to provide you with exceptional heart care, we have created designated Provider Care Teams.  These Care Teams include your primary Cardiologist (physician) and Advanced Practice Providers (APPs -  Physician Assistants and Nurse Practitioners) who all work together to provide you with the care you need, when you need it.  We recommend signing up for the patient portal called "MyChart".  Sign up information is provided on this After Visit Summary.  MyChart is used to connect with patients for Virtual Visits (Telemedicine).  Patients are able to view lab/test results, encounter notes, upcoming appointments, etc.  Non-urgent messages can be sent to your provider as well.   To learn more about what you can do with MyChart, go to [email or web address].    Your next appointment:   As scheduled with Dr. Harrell Gave   Other Instructions  Orthostatic Hypotension Blood pressure is a measurement of  how strongly, or weakly, your circulating blood is pressing against the walls of your arteries. Orthostatic hypotension is a drop in blood pressure that can happen when you change positions, such as when you go from lying down to standing. Arteries are blood vessels that carry blood from your heart throughout your body. When blood pressure is too low, you may not get enough blood to your brain or to the rest of your organs. Orthostatic hypotension can cause light-headedness, sweating, rapid heartbeat, blurred vision, and fainting. These symptoms require further investigation into the cause. What are the causes? Orthostatic hypotension can be caused by many things, including: Sudden changes in posture, such as standing up quickly after you have been sitting or lying down. Loss of blood (anemia) or loss of body fluids (dehydration). Heart problems, neurologic problems, or hormone problems. Pregnancy. Aging. The risk for this condition increases as you get older. Severe infection (sepsis). Certain medicines, such as medicines for high blood pressure or medicines that make the body lose excess fluids (diuretics). What are the signs or symptoms? Symptoms of this condition may include: Weakness, light-headedness, or dizziness. Sweating. Blurred vision. Tiredness (fatigue). Rapid heartbeat. Fainting, in severe cases. How is this diagnosed? This condition is diagnosed based on: Your symptoms and medical history. Your blood pressure measurements. Your health care provider will check your blood pressure when you are: Lying down. Sitting. Standing. A blood pressure reading is recorded as two numbers, such as "120 over 80" (or 120/80). The first ("top") number is called the systolic pressure. It is a measure  of the pressure in your arteries as your heart beats. The second ("bottom") number is called the diastolic pressure. It is a measure of the pressure in your arteries when your heart relaxes between  beats. Blood pressure is measured in a unit called mmHg. Healthy blood pressure for most adults is 120/80 mmHg. Orthostatic hypotension is defined as a 20 mmHg drop in systolic pressure or a 10 mmHg drop in diastolic pressure within 3 minutes of standing. Other information or tests that may be used to diagnose orthostatic hypotension include: Your other vital signs, such as your heart rate and temperature. Blood tests. An electrocardiogram (ECG) or echocardiogram. A Holter monitor. This is a device you wear that records your heart rhythm continuously, usually for 24-48 hours. Tilt table test. For this test, you will be safely secured to a table that moves you from a lying position to an upright position. Your heart rhythm and blood pressure will be monitored during the test. How is this treated? This condition may be treated by: Changing your diet. This may involve eating more salt (sodium) or drinking more water. Changing the dosage of certain medicines you are taking that might be lowering your blood pressure. Correcting the underlying reason for the orthostatic hypotension. Wearing compression stockings. Taking medicines to raise your blood pressure. Avoiding actions that trigger symptoms. Follow these instructions at home: Medicines Take over-the-counter and prescription medicines only as told by your health care provider. Follow instructions from your health care provider about changing the dosage of your current medicines, if this applies. Do not stop or adjust any of your medicines on your own. Eating and drinking  Drink enough fluid to keep your urine pale yellow. Eat extra salt only as directed. Do not add extra salt to your diet unless advised by your health care provider. Eat frequent, small meals. Avoid standing up suddenly after eating. General instructions  Get up slowly from lying down or sitting positions. This gives your blood pressure a chance to adjust. Avoid hot  showers and excessive heat as directed by your health care provider. Engage in regular physical activity as directed by your health care provider. If you have compression stockings, wear them as told. Keep all follow-up visits. This is important. Contact a health care provider if: You have a fever for more than 2-3 days. You feel more thirsty than usual. You feel dizzy or weak. Get help right away if: You have chest pain. You have a fast or irregular heartbeat. You become sweaty or feel light-headed. You feel short of breath. You faint. You have any symptoms of a stroke. "BE FAST" is an easy way to remember the main warning signs of a stroke: B - Balance. Signs are dizziness, sudden trouble walking, or loss of balance. E - Eyes. Signs are trouble seeing or a sudden change in vision. F - Face. Signs are sudden weakness or numbness of the face, or the face or eyelid drooping on one side. A - Arms. Signs are weakness or numbness in an arm. This happens suddenly and usually on one side of the body. S - Speech. Signs are sudden trouble speaking, slurred speech, or trouble understanding what people say. T - Time. Time to call emergency services. Write down what time symptoms started. You have other signs of a stroke, such as: A sudden, severe headache with no known cause. Nausea or vomiting. Seizure. These symptoms may represent a serious problem that is an emergency. Do not wait to see if the  symptoms will go away. Get medical help right away. Call your local emergency services (911 in the [place] Do not drive yourself to the hospital. Summary Orthostatic hypotension is a sudden drop in blood pressure. It can cause light-headedness, sweating, rapid heartbeat, blurred vision, and fainting. Orthostatic hypotension can be diagnosed by having your blood pressure taken while lying down, sitting, and then standing. Treatment may involve changing your diet, wearing compression stockings, sitting up  slowly, adjusting your medicines, or correcting the underlying reason for the orthostatic hypotension. Get help right away if you have chest pain, a fast or irregular heartbeat, or symptoms of a stroke. This information is not intended to replace advice given to you by your health care provider. Make sure you discuss any questions you have with your health care provider. Document Revised: 07/07/2020 Document Reviewed: 07/07/2020 Elsevier Patient Education  [place].

## 2021-02-01 ENCOUNTER — Encounter (HOSPITAL_BASED_OUTPATIENT_CLINIC_OR_DEPARTMENT_OTHER): Payer: Self-pay

## 2021-02-01 ENCOUNTER — Other Ambulatory Visit (HOSPITAL_COMMUNITY): Payer: Self-pay

## 2021-02-01 DIAGNOSIS — R55 Syncope and collapse: Secondary | ICD-10-CM

## 2021-02-10 ENCOUNTER — Other Ambulatory Visit (HOSPITAL_COMMUNITY): Payer: Self-pay

## 2021-02-14 ENCOUNTER — Ambulatory Visit (INDEPENDENT_AMBULATORY_CARE_PROVIDER_SITE_OTHER): Payer: No Typology Code available for payment source | Admitting: Family Medicine

## 2021-02-14 ENCOUNTER — Telehealth (INDEPENDENT_AMBULATORY_CARE_PROVIDER_SITE_OTHER): Payer: No Typology Code available for payment source | Admitting: Medical

## 2021-02-14 ENCOUNTER — Encounter: Payer: Self-pay | Admitting: Medical

## 2021-02-14 ENCOUNTER — Other Ambulatory Visit (HOSPITAL_COMMUNITY): Payer: Self-pay

## 2021-02-14 ENCOUNTER — Other Ambulatory Visit: Payer: Self-pay

## 2021-02-14 VITALS — HR 71 | Temp 97.5°F | Wt 224.0 lb

## 2021-02-14 DIAGNOSIS — R059 Cough, unspecified: Secondary | ICD-10-CM | POA: Diagnosis not present

## 2021-02-14 DIAGNOSIS — J329 Chronic sinusitis, unspecified: Secondary | ICD-10-CM

## 2021-02-14 DIAGNOSIS — J40 Bronchitis, not specified as acute or chronic: Secondary | ICD-10-CM

## 2021-02-14 MED ORDER — HYDROCOD POLST-CPM POLST ER 10-8 MG/5ML PO SUER
5.0000 mL | Freq: Two times a day (BID) | ORAL | 0 refills | Status: DC
  Filled 2021-02-14: qty 140, 14d supply, fill #0

## 2021-02-14 MED ORDER — AZITHROMYCIN 250 MG PO TABS
ORAL_TABLET | ORAL | 0 refills | Status: DC
  Filled 2021-02-14: qty 6, 5d supply, fill #0

## 2021-02-14 MED ORDER — ALBUTEROL SULFATE HFA 108 (90 BASE) MCG/ACT IN AERS
2.0000 | INHALATION_SPRAY | Freq: Four times a day (QID) | RESPIRATORY_TRACT | 0 refills | Status: DC | PRN
  Filled 2021-02-14: qty 18, 25d supply, fill #0

## 2021-02-14 NOTE — Progress Notes (Signed)
Subjective:     [name], female   DOB: 07-13-1980, 40 y.o.   MRN: [id number]  This visit type was conducted due to national recommendations for restrictions regarding the COVID-19 Pandemic (e.g. social distancing) in an effort to limit this patient's exposure and mitigate transmission in our community.  Due to their co-morbid illnesses, this patient is at least at moderate risk for complications without adequate follow up.  This format is felt to be most appropriate for this patient at this time.    Documentation for virtual audio and video telecommunications through [facility] encounter:  The patient was located at home. The provider was located in the office. The patient did consent to this visit and is aware of possible charges through their insurance for this visit.  The other persons participating in this telemedicine service were none. Time spent on call was 20 minutes and in review of previous records 20 minutes total.  This virtual service is not related to other E/M service within previous 7 days.   HPI Chief Complaint  Patient presents with   possible Sinus Infection    Has something going on, wheezing congestion, coughing, producing yellow and green mucous. Symptoms being going on 11 days. Negative covid test 2 days.    She notes 10 day hx/o cold symptoms.  Not improving.    Started with sore throat, then progressed to hoarse voice, cough, mucous congestion.  Has had intermittent loss of voice, having some tightness in chest, coughing up brown mucous, chest and head congestion.   No fever, no no NVD.   Has some wheezing.  Having head congestion and sinus pressure now.  Has taken 2 covid tests in this time frame and both negative.  Currently using Mucinex DM in day and nyquil at night.  Nonsomker.  No hx/o asthma, no prior inhaler use.  No other aggravating or relieving factors. No other complaint.  Past Medical History:  Diagnosis Date   Abnormal uterine  bleeding    Amenorrhea    Anemia    Anxiety    panic attacks   Back pain    Bradycardia    Chest pain    Constipation    COVID 02/2020   Diabetes ([place])    Dysmenorrhea    Endometriosis    Gallbladder problem    GERD (gastroesophageal reflux disease)    occasional - diet controlled and tums prn   HLD (hyperlipidemia)    HTN (hypertension)    IBS (irritable bowel syndrome)    Infertility, female    Insulin resistance    Migraine without aura    Missed abortion    x 2 - both resolved without surgery   Obesity    PCOS (polycystic ovarian syndrome)    PCOS (polycystic ovarian syndrome)    PONV (postoperative nausea and vomiting)    pe rpatient ; scopalamine patch very helpful    Sleep apnea    SVD (spontaneous vaginal delivery)    x 1   Syncope and collapse 06/21/2020   Urinary incontinence    Vitamin D deficiency    Current Outpatient Medications on File Prior to Visit  Medication Sig Dispense Refill   APPLE CIDER VINEGAR PO Take 1 tablet by mouth daily.     ascorbic acid (VITAMIN C) 500 MG tablet Take 500 mg by mouth daily.     Biotin w/ Vitamins C & E (HAIR/SKIN/NAILS PO) Take 1 tablet by mouth daily.      Chromium Picolinate  200 MCG CAPS Take 1 capsule by mouth daily.     clonazePAM (KLONOPIN) 0.5 MG tablet Take 1 tablet (0.5 mg total) by mouth 2 (two) times daily as needed for anxiety. 20 tablet 1   Coenzyme Q10 (COQ10) 100 MG CAPS Take 1 capsule by mouth daily.     Continuous Blood Gluc Sensor (FREESTYLE LIBRE 2 SENSOR) MISC APPLY ONE SENSOR EVERY 14 DAYS. 2 each 4   cyclobenzaprine (FLEXERIL) 10 MG tablet Take 1 tablet (10 mg total) by mouth 2 (two) times daily as needed for muscle spasms. 30 tablet 0   Docosahexaenoic Acid (DHA COMPLETE PO) Take 1 tablet by mouth daily.     fluconazole (DIFLUCAN) 150 MG tablet Take one tablet by mouth now, repeat in 72 hours if symptoms have not resolved. 2 tablet 0   levonorgestrel (MIRENA) 20 MCG/24HR IUD 1 each by Intrauterine  route once.     loratadine (CLARITIN) 10 MG tablet Take 10 mg by mouth daily as needed for allergies.     metFORMIN (GLUCOPHAGE) 500 MG tablet TAKE 1 TABLET BY MOUTH EVERY MORNING. 90 tablet 0   Multiple Vitamins-Minerals (BARIATRIC MULTIVITAMINS/IRON PO) Take 1 tablet by mouth in the morning and at bedtime.     nystatin cream (MYCOSTATIN) Apply 1 application topically 2 (two) times daily. Apply to affected area BID for up to 7 days. 30 g 1   Probiotic Product (PROBIOTIC ADVANCED PO) Take 1 capsule by mouth daily.      rosuvastatin (CRESTOR) 10 MG tablet Take 1 tablet (10 mg total) by mouth daily. 30 tablet 2   Spirulina 500 MG TABS Take 1 tablet by mouth daily.     SUMAtriptan (IMITREX) 50 MG tablet TAKE 1 TABLET BY MOUTH AT ONSET OF MIGRAINE, MAY REPEAT IN 2 HOURS IF HEADACHE PERSISTS 10 tablet 0   Vitamin D, Ergocalciferol, (DRISDOL) 1.25 MG (50000 UNIT) CAPS capsule TAKE 1 CAPSULE BY MOUTH EVERY 7 DAYS 12 capsule 0   No current facility-administered medications on file prior to visit.   Review of Systems As in subjective    Objective:   Physical Exam Due to coronavirus pandemic stay at home measures, patient visit was virtual and they were not examined in person.   Pulse 71   Temp (!) 97.5 F (36.4 C)   Wt 224 lb (101.6 kg)   SpO2 98%   BMI 34.06 kg/m   Gen: wd, wn, nad Somewhat congested sounding, mildly ill-appearing No obvious wheezing or labored breathing, answers questions in complete sentences      Assessment:     Encounter Diagnoses  Name Primary?   Sinobronchitis Yes   Cough, unspecified type        Plan:     We discussed symptoms and concerns.  Given her timeframe and lingering symptoms likely developing sinus infection and possible bronchitis.  Begin Z-Pak, continue efforts with rest and good hydration.  Can use albuterol if needed for wheezing and tightness in the chest.  Can add Tussionex instead of the NyQuil to see if this worked better for her.  Advised  not to take Claritin and NyQuil at the same time as this medicine.  Discussed risk and benefits of medicine, proper use of medicine.  If not seeing significant improvement by the end of the week 4 days from now then let me know  [name] [name] was seen today for possible sinus infection.  Diagnoses and all orders for this visit:  Sinobronchitis  Cough, unspecified type  Other orders -  azithromycin (ZITHROMAX) 250 MG tablet; 2 tablets day 1, then 1 tablet days 2-4 -     chlorpheniramine-HYDROcodone (TUSSIONEX PENNKINETIC ER) 10-8 MG/5ML SUER; Take 5 mLs by mouth 2 (two) times daily. -     albuterol (VENTOLIN HFA) 108 (90 Base) MCG/ACT inhaler; Inhale 2 puffs into the lungs every 6 (six) hours as needed for wheezing or shortness of breath.  F/u prn

## 2021-02-15 DIAGNOSIS — R42 Dizziness and giddiness: Secondary | ICD-10-CM | POA: Insufficient documentation

## 2021-02-15 DIAGNOSIS — R001 Bradycardia, unspecified: Secondary | ICD-10-CM | POA: Insufficient documentation

## 2021-02-15 DIAGNOSIS — G43909 Migraine, unspecified, not intractable, without status migrainosus: Secondary | ICD-10-CM | POA: Insufficient documentation

## 2021-02-15 DIAGNOSIS — G4719 Other hypersomnia: Secondary | ICD-10-CM | POA: Insufficient documentation

## 2021-02-15 DIAGNOSIS — G43101 Migraine with aura, not intractable, with status migrainosus: Secondary | ICD-10-CM | POA: Insufficient documentation

## 2021-02-15 NOTE — Procedures (Signed)
[facility] [place] TEST REPORT ( by [facility] PAT)   STUDY DATE:  01-26-2021 DOB:  11-Jun-1980 MRN: [id number]   ORDERING CLINICIAN: Larey Seat, MD  REFERRING CLINICIAN: Dr Rayann Heman, MD PCP Dr Joycelyn Rua, MD   CLINICAL INFORMATION/HISTORY: [name] [name] is a 40 y.o. female and seen here as in a new referral from Cardiology.  The patient works in a healthcare setting and had originally seen me over 3 years ago for a sleep apnea evaluation at the time she was preparing for gastric sleeve procedure with Dr. Johnathan Hausen. This surgery was performed on 10-16- 2018 and she lost 120 pounds, did not need her CPAP any longer after that.  In February of this year she developed syncopal spells.  A loop recorder was implanted and documented bradycardia. There is some weight regain.  She does have headaches following such spells and the incidence of near syncope spells have become very frequent at least 3 times a week or more.  Headaches are frontal - throbbing,  a feeling of pressure- behind the eyes.  one time she fainted by checking in a patient at the hospital.  So there is a significant impairment and she had to give up driving because she felt that her car was in motion but actually parked or standing.  None of her spells has been associated with loss of bowel or bladder control, there has been no tonic-clonic activity no tongue bite.  No injuries.  She does have a little feeling of chest tightness rather than chest pain and a sensation of vertigo moving while not actually in motion. She hydrates well.     Epworth sleepiness score: 10/24.   BMI: 34.7kg/m   Neck Circumference: 15   FINDINGS:   Sleep Summary:   Total Recording Time (hours, min): The overall recording time was 6 hours and 47 minutes of which 6 hours and 7 minutes were calculated sleep time.     Percent REM (%):  The percentage of REM sleep was 27.4.                                         Respiratory Indices:    Calculated pAHI (per hour): Overall apnea hypopnea index was 3.3/h during REM sleep slight increase to 4.8/h and in non-REM sleep AHI was 2.7/h.  In supine sleep the AHI was 4.3/h versus nonsupine 1.1/h.  Snoring level was mild to moderate with a mean volume of 40 dB.  Snoring was only present for about 19% of the recorded sleep time.  There was no evidence of hypoxia.  The heart rate varied between a minimum of 42 bpm and a maximum of 97 bpm with a mean heart rate of 55 bpm.                              IMPRESSION:  This HST did not detect any obstructive sleep apnea to be still present.  There was no significant pulse frequency abnormality, there was also no significant hypoxia.  Snoring was considered mild to moderate. Sleep was fragmented.   RECOMMENDATION: no intervention needed.     INTERPRETING PHYSICIAN:   Larey Seat, MD   Medical Director of [facility]

## 2021-02-15 NOTE — Progress Notes (Signed)
IMPRESSION:  This HST did not detect any obstructive sleep apnea to be still present.  There was no significant pulse frequency abnormality, there was also no significant hypoxia.  Snoring was considered mild to moderate. Sleep was fragmented.  RECOMMENDATION: no intervention needed.

## 2021-02-16 ENCOUNTER — Encounter (HOSPITAL_BASED_OUTPATIENT_CLINIC_OR_DEPARTMENT_OTHER): Payer: No Typology Code available for payment source

## 2021-02-16 ENCOUNTER — Encounter: Payer: Self-pay | Admitting: Neurology

## 2021-02-16 ENCOUNTER — Encounter (HOSPITAL_BASED_OUTPATIENT_CLINIC_OR_DEPARTMENT_OTHER): Payer: Self-pay

## 2021-02-20 ENCOUNTER — Ambulatory Visit: Payer: No Typology Code available for payment source | Admitting: Neurology

## 2021-02-27 ENCOUNTER — Ambulatory Visit (INDEPENDENT_AMBULATORY_CARE_PROVIDER_SITE_OTHER): Payer: No Typology Code available for payment source

## 2021-02-27 DIAGNOSIS — R55 Syncope and collapse: Secondary | ICD-10-CM | POA: Diagnosis not present

## 2021-02-27 LAB — CUP PACEART REMOTE DEVICE CHECK
Date Time Interrogation Session: 20221019230900
Implantable Pulse Generator Implant Date: 20220216

## 2021-03-07 ENCOUNTER — Encounter (INDEPENDENT_AMBULATORY_CARE_PROVIDER_SITE_OTHER): Payer: Self-pay | Admitting: Family Medicine

## 2021-03-07 ENCOUNTER — Other Ambulatory Visit (HOSPITAL_COMMUNITY): Payer: Self-pay

## 2021-03-07 ENCOUNTER — Other Ambulatory Visit: Payer: Self-pay

## 2021-03-07 ENCOUNTER — Ambulatory Visit (INDEPENDENT_AMBULATORY_CARE_PROVIDER_SITE_OTHER): Payer: No Typology Code available for payment source | Admitting: Adult Health

## 2021-03-07 ENCOUNTER — Ambulatory Visit (INDEPENDENT_AMBULATORY_CARE_PROVIDER_SITE_OTHER): Payer: No Typology Code available for payment source | Admitting: Family Medicine

## 2021-03-07 VITALS — BP 107/74 | HR 78 | Temp 98.1°F | Ht 68.0 in | Wt 227.0 lb

## 2021-03-07 DIAGNOSIS — E669 Obesity, unspecified: Secondary | ICD-10-CM | POA: Diagnosis not present

## 2021-03-07 DIAGNOSIS — E559 Vitamin D deficiency, unspecified: Secondary | ICD-10-CM

## 2021-03-07 DIAGNOSIS — R7301 Impaired fasting glucose: Secondary | ICD-10-CM

## 2021-03-07 DIAGNOSIS — Z6834 Body mass index (BMI) 34.0-34.9, adult: Secondary | ICD-10-CM

## 2021-03-07 DIAGNOSIS — F419 Anxiety disorder, unspecified: Secondary | ICD-10-CM | POA: Diagnosis not present

## 2021-03-07 DIAGNOSIS — F32A Depression, unspecified: Secondary | ICD-10-CM

## 2021-03-07 MED ORDER — TIRZEPATIDE 2.5 MG/0.5ML ~~LOC~~ SOAJ
2.5000 mg | SUBCUTANEOUS | 0 refills | Status: DC
  Filled 2021-03-07: qty 2, 28d supply, fill #0

## 2021-03-07 MED ORDER — VITAMIN D (ERGOCALCIFEROL) 1.25 MG (50000 UNIT) PO CAPS
ORAL_CAPSULE | ORAL | 0 refills | Status: DC
  Filled 2021-03-07: qty 12, 84d supply, fill #0

## 2021-03-07 NOTE — Progress Notes (Signed)
[facility] Summary Report / Loop Recorder 

## 2021-03-08 NOTE — Progress Notes (Signed)
Chief Complaint:   OBESITY [name] [name] is here to discuss her progress with her obesity treatment plan along with follow-up of her obesity related diagnoses. See Medical Weight Management Flowsheet for complete bioelectrical impedance results.  Today's visit was #: 33 Starting weight: 243 lbs Starting date: 06/18/2019 Weight change since last visit: 4 lbs Total lbs lost to date: 16 lbs Total weight loss percentage to date: -6.58%  Nutrition Plan: Category 1 Meal Plan for 25% of the time. Activity: Cardio/strength training for 120 minutes 3 times per week.   Interim History: [name] [name] broke up with her boyfriend last month.  She admits to worsening depression and anxiety and is interested in therapy.  Assessment/Plan:   1. Vitamin D deficiency At goal. She is taking vitamin D 50,000 IU weekly.  Plan: Continue to take prescription Vitamin D @50 ,000 IU every week as prescribed.  Follow-up for routine testing of Vitamin D, at least 2-3 times per year to avoid over-replacement.  Lab Results  Component Value Date   VD25OH 48.6 08/18/2020   VD25OH 43.7 10/14/2019   VD25OH 33.3 06/18/2019   - Refill Vitamin D, Ergocalciferol, (DRISDOL) 1.25 MG (50000 UNIT) CAPS capsule; TAKE 1 CAPSULE BY MOUTH EVERY 7 DAYS  Dispense: 12 capsule; Refill: 0  2. Impaired fasting blood sugar, with polyphagia Not at goal. Current treatment: None.    Plan:  Start Mounjaro 2.5 mg subcutaneously weekly.  We discussed the risk of pancreatitis at length.  She will continue to focus on protein-rich, low simple carbohydrate foods. We reviewed the importance of hydration, regular exercise for stress reduction, and restorative sleep.  - Start tirzepatide [name] [name]) 2.5 MG/0.5ML Pen; Inject 2.5 mg into the skin once a week.  Dispense: 2 mL; Refill: 0  3. Anxiety and depression Motivational interviewing as well as evidence-based interventions for health behavior change were utilized today including the discussion of  self monitoring techniques, problem-solving barriers and SMART goal setting techniques.   - Ambulatory referral to [facility]  4. Obesity, current BMI 34.5  Course: Allee is currently in the action stage of change. As such, her goal is to continue with weight loss efforts.   Nutrition goals: She has agreed to the Category 1 Plan.   Exercise goals:  As is.  Behavioral modification strategies: increasing lean protein intake, decreasing simple carbohydrates, increasing vegetables, increasing water intake, and decreasing liquid calories.  Davene has agreed to follow-up with our clinic in 4 weeks. She was informed of the importance of frequent follow-up visits to maximize her success with intensive lifestyle modifications for her multiple health conditions.   Objective:   Blood pressure 107/74, pulse 78, temperature 98.1 F (36.7 C), temperature source Oral, height 5\' 8"  (1.727 m), weight 227 lb (103 kg), SpO2 98 %. Body mass index is 34.52 kg/m.  General: Cooperative, alert, well developed, in no acute distress. HEENT: Conjunctivae and lids unremarkable. Cardiovascular: Regular rhythm.  Lungs: Normal work of breathing. Neurologic: No focal deficits.   Lab Results  Component Value Date   CREATININE 0.90 09/23/2020   BUN 13 09/23/2020   NA 140 09/23/2020   K 4.1 09/23/2020   CL 103 09/23/2020   CO2 21 09/23/2020   Lab Results  Component Value Date   ALT 12 09/23/2020   AST 14 09/23/2020   ALKPHOS 71 09/23/2020   BILITOT 0.3 09/23/2020   Lab Results  Component Value Date   HGBA1C 5.7 (H) 09/23/2020   HGBA1C 5.8 (H) 08/18/2020   HGBA1C 5.6  10/14/2019   HGBA1C 5.7 (H) 06/18/2019   HGBA1C 5.3 01/08/2019   Lab Results  Component Value Date   INSULIN 11.0 08/18/2020   INSULIN 11.9 10/14/2019   INSULIN 10.1 06/18/2019   Lab Results  Component Value Date   TSH 1.000 09/23/2020   Lab Results  Component Value Date   CHOL 205 (H) 08/18/2020   HDL 49  08/18/2020   LDLCALC 144 (H) 08/18/2020   LDLDIRECT 141 (H) 09/23/2020   TRIG 65 08/18/2020   CHOLHDL 4.2 08/18/2020   Lab Results  Component Value Date   VD25OH 48.6 08/18/2020   VD25OH 43.7 10/14/2019   VD25OH 33.3 06/18/2019   Lab Results  Component Value Date   WBC 8.4 01/13/2021   HGB 14.2 01/13/2021   HCT 41.5 01/13/2021   MCV 87.6 01/13/2021   PLT 285 01/13/2021   Lab Results  Component Value Date   FERRITIN 9 (L) 03/14/2016   Attestation Statements:   Reviewed by clinician on day of visit: allergies, medications, problem list, medical history, surgical history, family history, social history, and previous encounter notes.  Time spent on visit including pre-visit chart review and post-visit care and documentation was 43 minutes. Time was spent on: preparing to see the patient (eg, review of tests), performing a medically necessary appropriate examination and/or evaluation, counseling and educating the patient/family/caregiver, ordering medications, tests, or procedures, documenting clinical information in the electronic or other health, and care coordination .   I, Water quality scientist, CMA, am acting as transcriptionist for Briscoe Deutscher, DO  I have reviewed the above documentation for accuracy and completeness, and I agree with the above. -  Briscoe Deutscher, DO, MS, FAAFP, DABOM - Family and Bariatric Medicine.

## 2021-03-16 ENCOUNTER — Ambulatory Visit (INDEPENDENT_AMBULATORY_CARE_PROVIDER_SITE_OTHER): Payer: No Typology Code available for payment source

## 2021-03-16 ENCOUNTER — Other Ambulatory Visit: Payer: Self-pay

## 2021-03-16 DIAGNOSIS — R55 Syncope and collapse: Secondary | ICD-10-CM

## 2021-03-20 ENCOUNTER — Encounter (HOSPITAL_BASED_OUTPATIENT_CLINIC_OR_DEPARTMENT_OTHER): Payer: Self-pay

## 2021-03-21 ENCOUNTER — Ambulatory Visit (INDEPENDENT_AMBULATORY_CARE_PROVIDER_SITE_OTHER): Payer: No Typology Code available for payment source | Admitting: Cardiology

## 2021-03-21 ENCOUNTER — Encounter (HOSPITAL_BASED_OUTPATIENT_CLINIC_OR_DEPARTMENT_OTHER): Payer: Self-pay | Admitting: Cardiology

## 2021-03-21 ENCOUNTER — Other Ambulatory Visit: Payer: Self-pay

## 2021-03-21 VITALS — BP 98/80 | HR 69 | Ht 68.0 in | Wt 223.0 lb

## 2021-03-21 DIAGNOSIS — R001 Bradycardia, unspecified: Secondary | ICD-10-CM | POA: Diagnosis not present

## 2021-03-21 DIAGNOSIS — R55 Syncope and collapse: Secondary | ICD-10-CM | POA: Diagnosis not present

## 2021-03-21 DIAGNOSIS — R42 Dizziness and giddiness: Secondary | ICD-10-CM

## 2021-03-21 DIAGNOSIS — Z7189 Other specified counseling: Secondary | ICD-10-CM

## 2021-03-21 NOTE — Progress Notes (Signed)
Cardiology Office Note:    Date:  03/21/2021   ID:  [name], DOB 07/31/1980, MRN [id number]  PCP:  Carlena Hurl, PA-C  Cardiologist:  Buford Dresser, MD  CC: follow up  History of Present Illness:    [name] [name] is a 40 y.o. female with a hx of chronic dizziness, PCOS, prior bariatric surgery, former tobacco use, personal history of Covid infection who is seen for follow up.   Today: Overall, she is not feeling well today. She continues to have episodes of dizziness. Since yesterday she has been feeling ill with dizziness and lightheadedness, and her heart rate has been in the 40s. During these episodes she suffers from severe fatigue for the rest of the day. She also endorses bendopnea at times. Of note, she states she was generally feeling better before losing weight.  Also, she has been feeling stressed lately due to going through a divorce. She is trying to work 2 jobs, and believes her stressors may be contributing to her current symptoms.  Lately she has cut back on her amount of formal exercise. She is fearful of being away from home during her next episode of dizziness.  Normally she tries to stay hydrated with water and Gatorade.  She did not feel better on florinef or midodrine, has discontinued both.  She denies any palpitations, chest pain. No headaches, syncope, orthopnea, PND, lower extremity edema or exertional symptoms.   Past Medical History:  Diagnosis Date   Abnormal uterine bleeding    Amenorrhea    Anemia    Anxiety    panic attacks   Back pain    Bradycardia    Chest pain    Constipation    COVID 02/2020   Diabetes ([place])    Dysmenorrhea    Endometriosis    Gallbladder problem    GERD (gastroesophageal reflux disease)    occasional - diet controlled and tums prn   HLD (hyperlipidemia)    HTN (hypertension)    IBS (irritable bowel syndrome)    Infertility, female    Insulin resistance    Migraine without aura    Missed  abortion    x 2 - both resolved without surgery   Obesity    PCOS (polycystic ovarian syndrome)    PCOS (polycystic ovarian syndrome)    PONV (postoperative nausea and vomiting)    pe rpatient ; scopalamine patch very helpful    Sleep apnea    SVD (spontaneous vaginal delivery)    x 1   Syncope and collapse 06/21/2020   Urinary incontinence    Vitamin D deficiency     Past Surgical History:  Procedure Laterality Date   CHOLECYSTECTOMY     COLONOSCOPY  2017   polyps   CYSTOSCOPY N/A 10/29/2016   Procedure: CYSTOSCOPY;  Surgeon: Salvadore Dom, MD;  Location: [facility];  Service: Gynecology;  Laterality: N/A;   DILATATION & CURRETTAGE/HYSTEROSCOPY WITH RESECTOCOPE N/A 10/17/2012   Procedure: DILATATION & CURETTAGE/HYSTEROSCOPY WITH RESECTOCOPE; Possible Polypectomy, Possible Resectoscopic Myomectomy.;  Surgeon: Floyce Stakes. Pamala Hurry, MD;  Location: [facility];  Service: Gynecology;  Laterality: N/A;  1 hr.   DILATION AND CURETTAGE OF UTERUS     ENDOMETRIAL BIOPSY     ENDOVENOUS ABLATION SAPHENOUS VEIN W/ LASER Right 05/08/2018   endovenous laser ablation right greater saphenous vein and stab phlebectomy > 20 incisions right leg by Deitra Mayo MD    ESOPHAGOGASTRODUODENOSCOPY ENDOSCOPY  2017   Normal   HYSTEROSCOPY WITH D &  C N/A 12/01/2013   Procedure: IUD Removal;  Surgeon: Floyce Stakes. Pamala Hurry, MD;  Location: [facility];  Service: Gynecology;  Laterality: N/A;  90 min.   INTRAUTERINE DEVICE (IUD) INSERTION N/A 10/17/2012   Procedure: INTRAUTERINE DEVICE (IUD) INSERTION; Mirena;  Surgeon: Floyce Stakes. Pamala Hurry, MD;  Location: [facility];  Service: Gynecology;  Laterality: N/A;   LAPAROSCOPIC GASTRIC SLEEVE RESECTION N/A 02/19/2017   Procedure: LAPAROSCOPIC GASTRIC SLEEVE RESECTION, UPPER ENDOSCOPY;  Surgeon: Johnathan Hausen, MD;  Location: [facility];  Service: General;  Laterality: N/A;   LAPAROSCOPIC SALPINGO OOPHERECTOMY Left 10/29/2016   Procedure: LAPAROSCOPIC SALPINGO OOPHORECTOMY With anterior  uterine biopsy;  Surgeon: Salvadore Dom, MD;  Location: [facility];  Service: Gynecology;  Laterality: Left;   LASIK  10/06/2018   LOOP RECORDER INSERTION N/A 06/22/2020   Procedure: LOOP RECORDER INSERTION;  Surgeon: Evans Lance, MD;  Location: [facility];  Service: Cardiovascular;  Laterality: N/A;   MOUTH SURGERY     wisdom teeth    Current Medications: Current Outpatient Medications on File Prior to Visit  Medication Sig   albuterol (VENTOLIN HFA) 108 (90 Base) MCG/ACT inhaler Inhale 2 puffs into the lungs every 6 (six) hours as needed for wheezing or shortness of breath.   APPLE CIDER VINEGAR PO Take 1 tablet by mouth daily.   ascorbic acid (VITAMIN C) 500 MG tablet Take 500 mg by mouth daily.   Biotin w/ Vitamins C & E (HAIR/SKIN/NAILS PO) Take 1 tablet by mouth daily.    Chromium Picolinate 200 MCG CAPS Take 1 capsule by mouth daily.   clonazePAM (KLONOPIN) 0.5 MG tablet Take 1 tablet (0.5 mg total) by mouth 2 (two) times daily as needed for anxiety.   Coenzyme Q10 (COQ10) 100 MG CAPS Take 1 capsule by mouth daily.   Continuous Blood Gluc Sensor (FREESTYLE LIBRE 2 SENSOR) MISC APPLY ONE SENSOR EVERY 14 DAYS.   cyclobenzaprine (FLEXERIL) 10 MG tablet Take 1 tablet (10 mg total) by mouth 2 (two) times daily as needed for muscle spasms.   Docosahexaenoic Acid (DHA COMPLETE PO) Take 1 tablet by mouth daily.   levonorgestrel (MIRENA) 20 MCG/24HR IUD 1 each by Intrauterine route once.   loratadine (CLARITIN) 10 MG tablet Take 10 mg by mouth daily as needed for allergies.   Multiple Vitamins-Minerals (BARIATRIC MULTIVITAMINS/IRON PO) Take 1 tablet by mouth in the morning and at bedtime.   nystatin cream (MYCOSTATIN) Apply 1 application topically 2 (two) times daily. Apply to affected area BID for up to 7 days.   Probiotic Product (PROBIOTIC ADVANCED PO) Take 1 capsule by mouth daily.    Spirulina 500 MG TABS Take 1 tablet by mouth daily.   SUMAtriptan (IMITREX) 50 MG tablet  TAKE 1 TABLET BY MOUTH AT ONSET OF MIGRAINE, MAY REPEAT IN 2 HOURS IF HEADACHE PERSISTS (Patient taking differently: Take 50 mg by mouth every 2 (two) hours as needed for migraine.)   tirzepatide (MOUNJARO) 2.5 MG/0.5ML Pen Inject 2.5 mg into the skin once a week.   Vitamin D, Ergocalciferol, (DRISDOL) 1.25 MG (50000 UNIT) CAPS capsule TAKE 1 CAPSULE BY MOUTH EVERY 7 DAYS   No current facility-administered medications on file prior to visit.     Allergies:   Iohexol and Pyridium [phenazopyridine hcl]   Social History   Tobacco Use   Smoking status: Former    Packs/day: 0.25    Years: 19.00    Pack years: 4.75    Types: Cigarettes    Quit date: 06/06/2016  Years since quitting: 4.7   Smokeless tobacco: Never  Vaping Use   Vaping Use: Never used  Substance Use Topics   Alcohol use: Yes    Comment: 1 a month   Drug use: No    Family History: family history includes Alcohol abuse in her paternal grandfather; Breast cancer in her paternal aunt; Colon cancer in her maternal uncle; Dementia in her father; Depression in her mother; Diabetes in her father, maternal grandfather, maternal grandmother, mother, paternal grandfather, and paternal grandmother; Heart disease in her father, maternal grandfather, maternal grandmother, mother, and paternal grandfather; Hyperlipidemia in her father; Hypertension in her father and mother; Obesity in her father and mother; Parkinson's disease in her father; Sleep apnea in her father; Stroke in her maternal grandfather and paternal grandfather; Thyroid disease in her father. Family history: multiple family members with heart attacks and stents. Mat Gpa had bypass surgery and a pacemaker. Father has heart issues, had cath recently but no stent. Mother has mitral valve prolapse and palpitations. Gena Fray, paternal great-gpa, and uncle all had MI.   ROS:   Please see the history of present illness.   (+) Dizziness (+) Bendopnea (+) Stress Additional  pertinent ROS otherwise unremarkable.  EKGs/Labs/Other Studies Reviewed:    The following studies were reviewed today:  Monitor 04/21/18 NSR Rare PAC/PVC s  Symptoms do not correlate with arrhythmia Average HR 69 bpm  CT coronary 03/23/18 Coronary Arteries:  Normal coronary origin.  Right dominance.   RCA is a large dominant artery that gives rise to PDA and PLVB. There is no plaque.   Left main is a large artery that gives rise to LAD and LCX arteries. Left main has no plaque.   LAD is a large vessel that gives rise to two diagonal arteries. Proximal LAD has mild calcified plaque with associated stenosis 0-25%. Mid LAD has a mild non-calcified plaque with associated stenosis 0-25%.   LCX is a small non-dominant artery that gives rise to one small OM1 branch. There is no plaque.   Other findings:   Normal pulmonary vein drainage into the left atrium.   Normal let atrial appendage without a thrombus.   IMPRESSION: 1. Coronary calcium score of 46. This was 31 percentile for age and sex matched control.   2. Normal coronary origin with right dominance.   3. 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 Aggressive medical management is recommended.   4. Mildly dilated pulmonary artery measuring 32 mm suggestive of pulmonary hypertension.    EKG:  EKG is personally reviewed.   03/21/2021: NSR at 69 bpm 10/14/2020: sinus bradycardia at 49 bpm  Recent Labs: 06/21/2020: Magnesium 2.0 09/23/2020: ALT 12; BUN 13; Creatinine, Ser 0.90; Potassium 4.1; Sodium 140; TSH 1.000 01/13/2021: Hemoglobin 14.2; Platelets 285  Recent Lipid Panel    Component Value Date/Time   CHOL 205 (H) 08/18/2020 1015   TRIG 65 08/18/2020 1015   HDL 49 08/18/2020 1015   CHOLHDL 4.2 08/18/2020 1015   CHOLHDL 3.5 03/23/2018 0623   VLDL 8 03/23/2018 0623   LDLCALC 144 (H) 08/18/2020 1015   LDLDIRECT 141 (H) 09/23/2020 1130    Physical Exam:    VS:  BP 98/80 (BP Location: Left Arm,  Patient Position: Sitting, Cuff Size: Normal)   Pulse 69   Ht 5\' 8"  (1.727 m)   Wt 223 lb (101.2 kg)   BMI 33.91 kg/m     Wt Readings from Last 3 Encounters:  03/21/21 223 lb (101.2 kg)  03/07/21 227 lb (103 kg)  02/14/21 224 lb (101.6 kg)    GEN: Well nourished, well developed in no acute distress HEENT: Normal, moist mucous membranes NECK: No JVD CARDIAC: regular rhythm, normal S1 and S2, no rubs or gallops. No murmur. VASCULAR: Radial and DP pulses 2+ bilaterally. No carotid bruits RESPIRATORY:  Clear to auscultation without rales, wheezing or rhonchi  ABDOMEN: Soft, non-tender, non-distended MUSCULOSKELETAL:  Ambulates independently SKIN: Warm and dry, no edema NEUROLOGIC:  Alert and oriented x 3. No focal neuro deficits noted. PSYCHIATRIC:  Normal affect   ASSESSMENT:    1. Near syncope   2. Bradycardia   3. Dizziness   4. Counseling on health promotion and disease prevention     PLAN:    Near syncope, bradycardia, palpitations: -this has been very challenging for her, as events are unpredictable. While some events have occurred with significant sinus bradycardia, most symptomatic events do not correlate with bradycardia/pause/arrhythmia on loop recorder.  -however, these events can be random and debilitating. Interfering with her ability to work -has done compression stockings, fluids, addition of salt to diet without significant improvement. No change with midodrine or florinef -she is losing weight, encouraged her to continue to hydrate and exercise as able as well -she is in a difficult position. No clear etiology to her symptoms, but they continue to significantly impact her life. She is managing as best she can.   Not active today: Chest pain:  -prior reassuring CT coronary, has minimal nonobstructive CAD. Not suggestive of ischemia -counseled on red flag warning signs that need immediate medical attention  Cardiac risk counseling and prevention  recommendations: especially with family history of heart disease -recommend heart healthy/Mediterranean diet, with whole grains, fruits, vegetable, fish, lean meats, nuts, and olive oil. -recommend moderate walking, 3-5 times/week for 30-50 minutes each session. Aim for at least 150 minutes.week. Goal should be pace of 3 miles/hours, or walking 1.5 miles in 30 minutes -recommend avoidance of tobacco products. Avoid excess alcohol. -ASCVD risk score: The 10-year ASCVD risk score (Arnett DK, et al., 2019) is: 0.9%   Values used to calculate the score:     Age: 71 years     Sex: Female     Is Non-Hispanic African American: No     Diabetic: Yes     Tobacco smoker: No     Systolic Blood Pressure: 98 mmHg     Is BP treated: No     HDL Cholesterol: 49 mg/dL     Total Cholesterol: 205 mg/dL    Plan for follow up: 3 months.  Buford Dresser, MD, PhD, [facility]   Medication Adjustments/Labs and Tests Ordered: Current medicines are reviewed at length with the patient today.  Concerns regarding medicines are outlined above.   Orders Placed This Encounter  Procedures   EKG 12-Lead    No orders of the defined types were placed in this encounter.  Patient Instructions  Medication Instructions:  Your Physician recommend you continue on your current medication as directed.    *If you need a refill on your cardiac medications before your next appointment, please call your pharmacy*   Lab Work: None ordered today   Testing/Procedures: None ordered today   Follow-Up: At [facility], you and your health needs are our priority.  As part of our continuing mission to provide you with exceptional heart care, we have created designated Provider Care Teams.  These Care Teams include your primary Cardiologist (physician) and Advanced  Practice Providers (APPs -  Physician Assistants and Nurse Practitioners) who all work together to provide you with the care you  need, when you need it.  We recommend signing up for the patient portal called "MyChart".  Sign up information is provided on this After Visit Summary.  MyChart is used to connect with patients for Virtual Visits (Telemedicine).  Patients are able to view lab/test results, encounter notes, upcoming appointments, etc.  Non-urgent messages can be sent to your provider as well.   To learn more about what you can do with MyChart, go to [email or web address].    Your next appointment:   3 month(s)  The format for your next appointment:   In Person  Provider:   Buford Dresser, MD      Rondell Reams as a scribe for Buford Dresser, MD.,have documented all relevant documentation on the behalf of Buford Dresser, MD,as directed by  Buford Dresser, MD while in the presence of Buford Dresser, MD.  I, Buford Dresser, MD, have reviewed all documentation for this visit. The documentation on 03/21/21 for the exam, diagnosis, procedures, and orders are all accurate and complete.   Signed, Buford Dresser, MD PhD 03/21/2021  [place]

## 2021-03-21 NOTE — Patient Instructions (Signed)

## 2021-03-22 ENCOUNTER — Other Ambulatory Visit (INDEPENDENT_AMBULATORY_CARE_PROVIDER_SITE_OTHER): Payer: Self-pay

## 2021-03-22 ENCOUNTER — Other Ambulatory Visit (HOSPITAL_COMMUNITY): Payer: Self-pay

## 2021-03-22 DIAGNOSIS — R110 Nausea: Secondary | ICD-10-CM

## 2021-03-22 MED ORDER — ONDANSETRON 4 MG PO TBDP
4.0000 mg | ORAL_TABLET | Freq: Three times a day (TID) | ORAL | 0 refills | Status: DC | PRN
  Filled 2021-03-22: qty 20, 7d supply, fill #0

## 2021-03-24 ENCOUNTER — Ambulatory Visit: Payer: No Typology Code available for payment source | Admitting: Physician Assistant

## 2021-03-28 ENCOUNTER — Encounter (INDEPENDENT_AMBULATORY_CARE_PROVIDER_SITE_OTHER): Payer: Self-pay | Admitting: Family Medicine

## 2021-03-28 ENCOUNTER — Other Ambulatory Visit: Payer: Self-pay

## 2021-03-28 ENCOUNTER — Other Ambulatory Visit (HOSPITAL_COMMUNITY): Payer: Self-pay

## 2021-03-28 ENCOUNTER — Ambulatory Visit (INDEPENDENT_AMBULATORY_CARE_PROVIDER_SITE_OTHER): Payer: No Typology Code available for payment source | Admitting: Family Medicine

## 2021-03-28 VITALS — BP 106/70 | HR 67 | Temp 98.5°F | Ht 68.0 in | Wt 214.0 lb

## 2021-03-28 DIAGNOSIS — Z9884 Bariatric surgery status: Secondary | ICD-10-CM

## 2021-03-28 DIAGNOSIS — R7301 Impaired fasting glucose: Secondary | ICD-10-CM | POA: Diagnosis not present

## 2021-03-28 DIAGNOSIS — Z79899 Other long term (current) drug therapy: Secondary | ICD-10-CM

## 2021-03-28 DIAGNOSIS — Z6834 Body mass index (BMI) 34.0-34.9, adult: Secondary | ICD-10-CM

## 2021-03-28 DIAGNOSIS — F410 Panic disorder [episodic paroxysmal anxiety] without agoraphobia: Secondary | ICD-10-CM | POA: Diagnosis not present

## 2021-03-28 DIAGNOSIS — E669 Obesity, unspecified: Secondary | ICD-10-CM

## 2021-03-28 DIAGNOSIS — F430 Acute stress reaction: Secondary | ICD-10-CM

## 2021-03-28 MED ORDER — ONDANSETRON 4 MG PO TBDP
4.0000 mg | ORAL_TABLET | Freq: Three times a day (TID) | ORAL | 0 refills | Status: DC | PRN
  Filled 2021-03-28: qty 20, 7d supply, fill #0

## 2021-03-28 MED ORDER — TIRZEPATIDE 2.5 MG/0.5ML ~~LOC~~ SOAJ
2.5000 mg | SUBCUTANEOUS | 0 refills | Status: DC
  Filled 2021-03-28: qty 2, 28d supply, fill #0

## 2021-03-28 MED ORDER — CLONAZEPAM 0.5 MG PO TABS
0.5000 mg | ORAL_TABLET | Freq: Two times a day (BID) | ORAL | 1 refills | Status: DC | PRN
  Filled 2021-03-28: qty 20, 10d supply, fill #0

## 2021-03-28 MED ORDER — CLONAZEPAM 0.5 MG PO TABS: 0.5000 mg | ORAL_TABLET | Freq: Two times a day (BID) | ORAL | 1 refills | Status: DC | PRN

## 2021-03-28 NOTE — Progress Notes (Signed)
Chief Complaint:   OBESITY [name] [name] is here to discuss her progress with her obesity treatment plan along with follow-up of her obesity related diagnoses. See Medical Weight Management Flowsheet for complete bioelectrical impedance results.  Today's visit was #: 18 Starting weight: 243 lbs Starting date: 06/18/2019 Weight change since last visit: 13 lbs Total lbs lost to date: 29 lbs Total weight loss percentage to date: -11.93%  Nutrition Plan: Category 1 Plan for 80% of the time.  Activity: Cardio/resistance for 45-60 minutes 3-5 times per week.  Anti-obesity medications: Mounjaro 2.5 mg subcutaneously weekly. Reported side effects: None.  Interim History: [name] [name] says she is doing well.  She is happy with the plan and her progress.  She says she has decreased her exercise to no more than 1 hour per day (when feeling well).  We reviewed supplements today.  She was hoping to use a CGM to monitor for hypoglycemic unawareness.  She reports that she saw Cardiology recently - she is drinking more Gatorade Zero.  Assessment/Plan:   1. Impaired fasting blood sugar, with polyphagia Controlled. Current treatment: Mounjaro 2.5 mg subcutaneously weekly.    Plan:  Will refill Mounjaro 2.5 mg subcutaneously weekly and Zofran 4 mg every 8 hours as needed for nausea.  We will see if a Dexcom sample is available. She will continue to focus on protein-rich, low simple carbohydrate foods. We reviewed the importance of hydration, regular exercise for stress reduction, and restorative sleep.  - Refill tirzepatide (MOUNJARO) 2.5 MG/0.5ML Pen; Inject 2.5 mg into the skin once a week.  Dispense: 2 mL; Refill: 0 - Refill ondansetron (ZOFRAN-ODT) 4 MG disintegrating tablet; Dissolve 1 tablet (4 mg total) by mouth every 8 (eight) hours as needed for nausea or vomiting.  Dispense: 20 tablet; Refill: 0  2. Panic attack as reaction to stress Improving. [name] is taking Klonopin 0.5 mg twice daily as needed  for anxiety and panic.  Plan:  Continue Klonopin at current dose.  Will refill today.  I will also place a referral to [facility].  - Refill clonazePAM (KLONOPIN) 0.5 MG tablet; Take 1 tablet (0.5 mg total) by mouth 2 (two) times daily as needed for anxiety.  Dispense: 20 tablet; Refill: 1  3. History of bariatric surgery [name] [name] is at risk for malnutrition due to her previous bariatric surgery.   Counseling You may need to eat 3 meals and 2 snacks, or 5 small meals each day in order to reach your protein and calorie goals.  Allow at least 15 minutes for each meal so that you can eat mindfully. Listen to your body so that you do not overeat. For most people, your sleeve or pouch will comfortably hold 4-6 ounces. Eat foods from all food groups. This includes fruits and vegetables, grains, dairy, and meat and other proteins. Include a protein-rich food at every meal and snack, and eat the protein food first.  You should be taking a Bariatric Multivitamin as well as calcium.   4. Medication management Med rec was completed today. We discontinued multiple vitamins/supplements.   5. Obesity, current BMI 32.7  Course: [name] [name] is currently in the action stage of change. As such, her goal is to continue with weight loss efforts.   Nutrition goals: She has agreed to the Category 1 Plan.   Exercise goals:  As is.  Behavioral modification strategies: increasing lean protein intake, increasing water intake with electrolytes, and no skipping meals.  [name] has agreed to follow-up with our clinic  in 4 weeks. She was informed of the importance of frequent follow-up visits to maximize her success with intensive lifestyle modifications for her multiple health conditions.   Objective:   Blood pressure 106/70, pulse 67, temperature 98.5 F (36.9 C), temperature source Oral, height 5\' 8"  (1.727 m), weight 214 lb (97.1 kg), SpO2 100 %. Body mass index is 32.54 kg/m.  General: Cooperative,  alert, well developed, in no acute distress. HEENT: Conjunctivae and lids unremarkable. Cardiovascular: Regular rhythm.  Lungs: Normal work of breathing. Neurologic: No focal deficits.   Lab Results  Component Value Date   CREATININE 0.90 09/23/2020   BUN 13 09/23/2020   NA 140 09/23/2020   K 4.1 09/23/2020   CL 103 09/23/2020   CO2 21 09/23/2020   Lab Results  Component Value Date   ALT 12 09/23/2020   AST 14 09/23/2020   ALKPHOS 71 09/23/2020   BILITOT 0.3 09/23/2020   Lab Results  Component Value Date   HGBA1C 5.7 (H) 09/23/2020   HGBA1C 5.8 (H) 08/18/2020   HGBA1C 5.6 10/14/2019   HGBA1C 5.7 (H) 06/18/2019   HGBA1C 5.3 01/08/2019   Lab Results  Component Value Date   INSULIN 11.0 08/18/2020   INSULIN 11.9 10/14/2019   INSULIN 10.1 06/18/2019   Lab Results  Component Value Date   TSH 1.000 09/23/2020   Lab Results  Component Value Date   CHOL 205 (H) 08/18/2020   HDL 49 08/18/2020   LDLCALC 144 (H) 08/18/2020   LDLDIRECT 141 (H) 09/23/2020   TRIG 65 08/18/2020   CHOLHDL 4.2 08/18/2020   Lab Results  Component Value Date   VD25OH 48.6 08/18/2020   VD25OH 43.7 10/14/2019   VD25OH 33.3 06/18/2019   Lab Results  Component Value Date   WBC 8.4 01/13/2021   HGB 14.2 01/13/2021   HCT 41.5 01/13/2021   MCV 87.6 01/13/2021   PLT 285 01/13/2021   Lab Results  Component Value Date   FERRITIN 9 (L) 03/14/2016   Attestation Statements:   Reviewed by clinician on day of visit: allergies, medications, problem list, medical history, surgical history, family history, social history, and previous encounter notes.  I, Water quality scientist, CMA, am acting as transcriptionist for Briscoe Deutscher, DO  I have reviewed the above documentation for accuracy and completeness, and I agree with the above. -  Briscoe Deutscher, DO, MS, FAAFP, DABOM - Family and Bariatric Medicine.

## 2021-03-29 ENCOUNTER — Other Ambulatory Visit (HOSPITAL_COMMUNITY): Payer: Self-pay

## 2021-04-03 ENCOUNTER — Other Ambulatory Visit (INDEPENDENT_AMBULATORY_CARE_PROVIDER_SITE_OTHER): Payer: Self-pay

## 2021-04-03 ENCOUNTER — Ambulatory Visit (INDEPENDENT_AMBULATORY_CARE_PROVIDER_SITE_OTHER): Payer: No Typology Code available for payment source

## 2021-04-03 DIAGNOSIS — R55 Syncope and collapse: Secondary | ICD-10-CM

## 2021-04-03 DIAGNOSIS — F32A Depression, unspecified: Secondary | ICD-10-CM

## 2021-04-04 ENCOUNTER — Other Ambulatory Visit (INDEPENDENT_AMBULATORY_CARE_PROVIDER_SITE_OTHER): Payer: No Typology Code available for payment source

## 2021-04-04 ENCOUNTER — Other Ambulatory Visit: Payer: Self-pay

## 2021-04-04 ENCOUNTER — Telehealth (INDEPENDENT_AMBULATORY_CARE_PROVIDER_SITE_OTHER): Payer: No Typology Code available for payment source | Admitting: Family Medicine

## 2021-04-04 ENCOUNTER — Encounter: Payer: Self-pay | Admitting: Family Medicine

## 2021-04-04 VITALS — Temp 98.9°F | Wt 214.0 lb

## 2021-04-04 DIAGNOSIS — R059 Cough, unspecified: Secondary | ICD-10-CM

## 2021-04-04 DIAGNOSIS — M791 Myalgia, unspecified site: Secondary | ICD-10-CM

## 2021-04-04 LAB — POCT INFLUENZA A/B
Influenza A, POC: NEGATIVE
Influenza B, POC: NEGATIVE

## 2021-04-04 LAB — CUP PACEART REMOTE DEVICE CHECK
Date Time Interrogation Session: 20221121230811
Implantable Pulse Generator Implant Date: 20220216

## 2021-04-04 LAB — POC COVID19 BINAXNOW: SARS Coronavirus 2 Ag: NEGATIVE

## 2021-04-04 NOTE — Progress Notes (Signed)
   Subjective:    [name], female    DOB: 10/13/1980, 40 y.o.   MRN: [id number]  HPI Documentation for virtual audio and video telecommunications through [place] encounter: The patient was located at home. 2 patient identifiers used.  The provider was located in the office. The patient did consent to this visit and is aware of possible charges through their insurance for this visit. The other persons participating in this telemedicine service were none. Time spent on call was 5 minutes and in review of previous records >15 minutes total for counseling and coordination of care. This virtual service is not related to other E/M service within previous 7 days.  She states that yesterday she developed some fatigue and nasal congestion and was sent home from work.  Today she woke up with fatigue, nasal congestion, myalgias, cough and a temperature above her norm of 98.9.  She has had 3 COVID vaccines.  She will need to be tested since she does work at [employer].  Review of Systems     Objective:   Physical Exam  Alert and in no distress otherwise not examined      Assessment & Plan:  Myalgia  Cough, unspecified type She will be set up for COVID testing. COVID and flu test was negative.  Specimen sent off for PCR. Explained that she will need to wait till the PCR test come back before she can return to work.

## 2021-04-05 LAB — NOVEL CORONAVIRUS, NAA: SARS-CoV-2, NAA: NOT DETECTED

## 2021-04-05 LAB — SARS-COV-2, NAA 2 DAY TAT

## 2021-04-11 NOTE — Progress Notes (Signed)
[facility] Summary Report / Loop Recorder 

## 2021-04-12 ENCOUNTER — Other Ambulatory Visit (INDEPENDENT_AMBULATORY_CARE_PROVIDER_SITE_OTHER): Payer: Self-pay

## 2021-04-12 ENCOUNTER — Other Ambulatory Visit (HOSPITAL_COMMUNITY): Payer: Self-pay

## 2021-04-12 ENCOUNTER — Other Ambulatory Visit (INDEPENDENT_AMBULATORY_CARE_PROVIDER_SITE_OTHER): Payer: Self-pay | Admitting: Family Medicine

## 2021-04-12 DIAGNOSIS — E65 Localized adiposity: Secondary | ICD-10-CM

## 2021-04-12 DIAGNOSIS — R7301 Impaired fasting glucose: Secondary | ICD-10-CM

## 2021-04-12 DIAGNOSIS — R55 Syncope and collapse: Secondary | ICD-10-CM

## 2021-04-12 MED ORDER — NYSTATIN 100000 UNIT/GM EX OINT
1.0000 "application " | TOPICAL_OINTMENT | Freq: Two times a day (BID) | CUTANEOUS | 0 refills | Status: DC
  Filled 2021-04-12: qty 30, 15d supply, fill #0

## 2021-04-12 MED ORDER — DEXCOM G6 SENSOR MISC: 0 refills | Status: DC

## 2021-04-12 MED ORDER — FLUCONAZOLE 150 MG PO TABS
150.0000 mg | ORAL_TABLET | Freq: Once | ORAL | 0 refills | Status: AC
  Filled 2021-04-12: qty 1, 1d supply, fill #0

## 2021-04-12 MED ORDER — DEXCOM G6 TRANSMITTER MISC: 0 refills | Status: DC

## 2021-04-12 MED ORDER — METRONIDAZOLE 500 MG PO TABS
500.0000 mg | ORAL_TABLET | Freq: Three times a day (TID) | ORAL | 0 refills | Status: AC
  Filled 2021-04-12: qty 21, 7d supply, fill #0

## 2021-04-12 NOTE — Progress Notes (Signed)
Medication Samples have been provided to the patient.  Drug name: Dexcom G6 CGM system        Qty: 1  LOT: [id number]  Exp.Date: 01/11/2022  Dosing instructions: Use to monitor blood sugar.  The patient has been instructed regarding the correct time, dose, and frequency of taking this medication, including desired effects and most common side effects.   [name] [name]  [name] 2:16 PM 04/12/2021

## 2021-04-18 ENCOUNTER — Other Ambulatory Visit: Payer: Self-pay

## 2021-04-18 ENCOUNTER — Ambulatory Visit (INDEPENDENT_AMBULATORY_CARE_PROVIDER_SITE_OTHER): Payer: No Typology Code available for payment source | Admitting: Adult Health

## 2021-04-18 ENCOUNTER — Other Ambulatory Visit (HOSPITAL_COMMUNITY): Payer: Self-pay

## 2021-04-18 ENCOUNTER — Encounter (INDEPENDENT_AMBULATORY_CARE_PROVIDER_SITE_OTHER): Payer: Self-pay

## 2021-04-18 VITALS — BP 104/66 | HR 56 | Temp 98.3°F | Ht 68.0 in | Wt 214.0 lb

## 2021-04-18 DIAGNOSIS — E669 Obesity, unspecified: Secondary | ICD-10-CM

## 2021-04-18 DIAGNOSIS — F410 Panic disorder [episodic paroxysmal anxiety] without agoraphobia: Secondary | ICD-10-CM | POA: Diagnosis not present

## 2021-04-18 DIAGNOSIS — Z9189 Other specified personal risk factors, not elsewhere classified: Secondary | ICD-10-CM | POA: Diagnosis not present

## 2021-04-18 DIAGNOSIS — E119 Type 2 diabetes mellitus without complications: Secondary | ICD-10-CM

## 2021-04-18 DIAGNOSIS — F430 Acute stress reaction: Secondary | ICD-10-CM | POA: Diagnosis not present

## 2021-04-18 DIAGNOSIS — Z6834 Body mass index (BMI) 34.0-34.9, adult: Secondary | ICD-10-CM

## 2021-04-18 MED ORDER — TIRZEPATIDE 2.5 MG/0.5ML ~~LOC~~ SOAJ
2.5000 mg | SUBCUTANEOUS | 0 refills | Status: DC
  Filled 2021-04-18: qty 2, 28d supply, fill #0

## 2021-04-18 MED ORDER — DEXCOM G6 TRANSMITTER MISC: 0 refills | Status: DC

## 2021-04-18 MED ORDER — DEXCOM G6 SENSOR MISC: 0 refills | Status: DC

## 2021-04-18 MED ORDER — ONDANSETRON 4 MG PO TBDP
4.0000 mg | ORAL_TABLET | Freq: Three times a day (TID) | ORAL | 0 refills | Status: DC | PRN
  Filled 2021-04-18: qty 20, 6d supply, fill #0
  Filled 2021-04-27: qty 20, 7d supply, fill #0

## 2021-04-18 MED ORDER — DEXCOM G6 SENSOR MISC
0 refills | Status: DC
  Filled 2021-04-18: qty 3, 30d supply, fill #0

## 2021-04-18 MED ORDER — DEXCOM G6 TRANSMITTER MISC
1.0000 | Freq: Every day | 0 refills | Status: DC
  Filled 2021-04-18: qty 1, 90d supply, fill #0

## 2021-04-18 NOTE — Addendum Note (Signed)
Addended by: Lebron Conners on: 04/18/2021 02:30 PM   Modules accepted: Orders

## 2021-04-18 NOTE — Progress Notes (Signed)
Chief Complaint:   OBESITY [name] [name] is here to discuss her progress with her obesity treatment plan along with follow-up of her obesity related diagnoses. [name] [name] is on the Category 1 Plan and states she is following her eating plan approximately 80% of the time. [name] [name] states she is doing cardio and resistance training for 30-60 minutes 3 times per week.  Today's visit was #: 49 Starting weight: 243 lbs Starting date: 06/18/2019 Today's weight: 214 lbs Today's date: 04/18/2021 Total lbs lost to date: 29 lbs Total lbs lost since last in-office visit: 0  Interim History:   On 03/07/2021, [name] [name] was started on Mounjaro 2.5 mg once weekly.   She has remained on this dosage due to national drug shortage. She reports stable appetite. She has continued to exercise 3 x week- helped maintain weight and stabilize anxiety.  Of note:  New PCP - [name] Bode, MD - [facility].  Subjective:   1. Type 2 diabetes mellitus without complication, without long-term current use of insulin ([place]) On 10/10/2016, A1c 7.2.  She was previously on Metformin for diabetic control- unable to tolerate due to diarrhea. Last A1c 5.7 on 09/23/2020 - at goal.  At this time, she is not on any antidiabetic medications. On 03/07/2021, [name] [name] was started on Mounjaro 2.5 mg once weekly.   Injects Mounjaro on Wednesday and unable to increase dosage due to national drug shortage. She denies mass in neck, dysphagia, dyspepsia, persistent hoarseness, or current GI upset. She did experience acute pancreatitis 2019. She denies excessive ETOH use.  2. Panic attack as reaction to stress She reports decrease of acute symptoms. She estimates to use Klonopin 0.5 mg once weekly - PDMP reviewed - no aberrancies noted. She is seeing her pastor and seeking a female psychologist. She reports regular exercise helps reduce anxiety sx's. She denies SI/HI. She moved from shared home with sister back to her private  home- much happier.  3. At risk for nausea [name] [name] is at risk for nausea due to taking Mounjaro for T2D.  Assessment/Plan:   1. Type 2 diabetes mellitus without complication, without long-term current use of insulin (HCC)  - Refill tirzepatide (MOUNJARO) 2.5 MG/0.5ML Pen; Inject 2.5 mg into the skin once a week.  Dispense: 2 mL; Refill: 0 - Refill Continuous Blood Gluc Sensor (DEXCOM G6 SENSOR) MISC; Use to monitor blood sugar  Dispense: 1 each; Refill: 0 - Refill ondansetron (ZOFRAN-ODT) 4 MG disintegrating tablet; Dissolve 1 tablet by mouth every 8 hours as needed for nausea or vomiting.  Dispense: 20 tablet; Refill: 0 - Refill Continuous Blood Gluc Transmit (DEXCOM G6 TRANSMITTER) MISC; Use as directed  Dispense: 1 each; Refill: 0  2. Panic attack as reaction to stress Continue with regular exercise.  Continue with pastor.  Continue to seek female psychologist.  3. At risk for nausea [name] [name] was given approximately 15 minutes of nausea prevention counseling today. [name] [name] is at risk for nausea due to her new or current medication. She was encouraged to titrate her medication slowly, make sure to stay hydrated, eat smaller portions throughout the day, and avoid high fat meals.   4. Obesity, current BMI 32.5  [name] [name] is currently in the action stage of change. As such, her goal is to continue with weight loss efforts. She has agreed to the Category 1 Plan.   Exercise goals:  As is.  Behavioral modification strategies: increasing lean protein intake, decreasing simple carbohydrates, increasing water intake, meal planning and cooking strategies,  keeping healthy foods in the home, and planning for success.  [name] [name] has agreed to follow-up with our clinic in 3 weeks. She was informed of the importance of frequent follow-up visits to maximize her success with intensive lifestyle modifications for her multiple health conditions.   Objective:   Blood pressure 104/66, pulse (!) 56,  temperature 98.3 F (36.8 C), height 5\' 8"  (1.727 m), weight 214 lb (97.1 kg), SpO2 97 %. Body mass index is 32.54 kg/m.  General: Cooperative, alert, well developed, in no acute distress. HEENT: Conjunctivae and lids unremarkable. Cardiovascular: Regular rhythm.  Lungs: Normal work of breathing. Neurologic: No focal deficits.   Lab Results  Component Value Date   CREATININE 0.90 09/23/2020   BUN 13 09/23/2020   NA 140 09/23/2020   K 4.1 09/23/2020   CL 103 09/23/2020   CO2 21 09/23/2020   Lab Results  Component Value Date   ALT 12 09/23/2020   AST 14 09/23/2020   ALKPHOS 71 09/23/2020   BILITOT 0.3 09/23/2020   Lab Results  Component Value Date   HGBA1C 5.7 (H) 09/23/2020   HGBA1C 5.8 (H) 08/18/2020   HGBA1C 5.6 10/14/2019   HGBA1C 5.7 (H) 06/18/2019   HGBA1C 5.3 01/08/2019   Lab Results  Component Value Date   INSULIN 11.0 08/18/2020   INSULIN 11.9 10/14/2019   INSULIN 10.1 06/18/2019   Lab Results  Component Value Date   TSH 1.000 09/23/2020   Lab Results  Component Value Date   CHOL 205 (H) 08/18/2020   HDL 49 08/18/2020   LDLCALC 144 (H) 08/18/2020   LDLDIRECT 141 (H) 09/23/2020   TRIG 65 08/18/2020   CHOLHDL 4.2 08/18/2020   Lab Results  Component Value Date   VD25OH 48.6 08/18/2020   VD25OH 43.7 10/14/2019   VD25OH 33.3 06/18/2019   Lab Results  Component Value Date   WBC 8.4 01/13/2021   HGB 14.2 01/13/2021   HCT 41.5 01/13/2021   MCV 87.6 01/13/2021   PLT 285 01/13/2021   Lab Results  Component Value Date   FERRITIN 9 (L) 03/14/2016   Attestation Statements:   Reviewed by clinician on day of visit: allergies, medications, problem list, medical history, surgical history, family history, social history, and previous encounter notes.  I, Water quality scientist, CMA, am acting as Location manager for [name], NP.  I have reviewed the above documentation for accuracy and completeness, and I agree with the above. -  Madina Galati d. Areebah Meinders, NP-C

## 2021-04-19 ENCOUNTER — Encounter (INDEPENDENT_AMBULATORY_CARE_PROVIDER_SITE_OTHER): Payer: Self-pay | Admitting: Adult Health

## 2021-04-19 NOTE — Telephone Encounter (Signed)
Pt needs rx for Dexcom receiver  sent to [facility].

## 2021-04-20 ENCOUNTER — Other Ambulatory Visit (HOSPITAL_COMMUNITY): Payer: Self-pay

## 2021-04-25 ENCOUNTER — Other Ambulatory Visit (INDEPENDENT_AMBULATORY_CARE_PROVIDER_SITE_OTHER): Payer: Self-pay | Admitting: Adult Health

## 2021-04-25 ENCOUNTER — Encounter (INDEPENDENT_AMBULATORY_CARE_PROVIDER_SITE_OTHER): Payer: Self-pay | Admitting: Adult Health

## 2021-04-25 ENCOUNTER — Other Ambulatory Visit (HOSPITAL_COMMUNITY): Payer: Self-pay

## 2021-04-25 ENCOUNTER — Other Ambulatory Visit (INDEPENDENT_AMBULATORY_CARE_PROVIDER_SITE_OTHER): Payer: Self-pay

## 2021-04-25 DIAGNOSIS — E65 Localized adiposity: Secondary | ICD-10-CM

## 2021-04-25 MED ORDER — TIRZEPATIDE 5 MG/0.5ML ~~LOC~~ SOAJ
5.0000 mg | SUBCUTANEOUS | 0 refills | Status: DC
  Filled 2021-04-25: qty 6, 84d supply, fill #0
  Filled 2021-04-25: qty 2, 28d supply, fill #0

## 2021-04-25 NOTE — Telephone Encounter (Signed)
Pt seen 04-18-21 has  requested 5mg  since it is now in stock at [facility].Marland KitchenHas not picked up the 2.5 yet that was sent in on 04-18-21.Marland Kitchen

## 2021-04-26 ENCOUNTER — Other Ambulatory Visit (HOSPITAL_COMMUNITY): Payer: Self-pay

## 2021-04-27 ENCOUNTER — Other Ambulatory Visit (HOSPITAL_COMMUNITY): Payer: Self-pay

## 2021-05-02 ENCOUNTER — Ambulatory Visit (INDEPENDENT_AMBULATORY_CARE_PROVIDER_SITE_OTHER): Payer: Self-pay

## 2021-05-02 DIAGNOSIS — R55 Syncope and collapse: Secondary | ICD-10-CM

## 2021-05-02 LAB — CUP PACEART REMOTE DEVICE CHECK
Date Time Interrogation Session: 20221224230432
Implantable Pulse Generator Implant Date: 20220216

## 2021-05-03 ENCOUNTER — Other Ambulatory Visit (HOSPITAL_COMMUNITY): Payer: Self-pay

## 2021-05-04 ENCOUNTER — Other Ambulatory Visit (HOSPITAL_COMMUNITY): Payer: Self-pay

## 2021-05-10 ENCOUNTER — Telehealth (HOSPITAL_COMMUNITY): Payer: Self-pay | Admitting: Medical

## 2021-05-10 ENCOUNTER — Encounter (INDEPENDENT_AMBULATORY_CARE_PROVIDER_SITE_OTHER): Payer: Self-pay | Admitting: Family Medicine

## 2021-05-10 ENCOUNTER — Ambulatory Visit (INDEPENDENT_AMBULATORY_CARE_PROVIDER_SITE_OTHER): Payer: No Typology Code available for payment source | Admitting: Family Medicine

## 2021-05-10 ENCOUNTER — Other Ambulatory Visit (HOSPITAL_COMMUNITY): Payer: Self-pay

## 2021-05-10 VITALS — BP 92/58 | HR 82 | Temp 98.1°F | Ht 68.0 in | Wt 204.0 lb

## 2021-05-10 DIAGNOSIS — E119 Type 2 diabetes mellitus without complications: Secondary | ICD-10-CM

## 2021-05-10 DIAGNOSIS — E669 Obesity, unspecified: Secondary | ICD-10-CM | POA: Diagnosis not present

## 2021-05-10 DIAGNOSIS — E559 Vitamin D deficiency, unspecified: Secondary | ICD-10-CM

## 2021-05-10 DIAGNOSIS — Z6834 Body mass index (BMI) 34.0-34.9, adult: Secondary | ICD-10-CM

## 2021-05-10 DIAGNOSIS — L304 Erythema intertrigo: Secondary | ICD-10-CM

## 2021-05-10 MED ORDER — VITAMIN D (ERGOCALCIFEROL) 1.25 MG (50000 UNIT) PO CAPS
ORAL_CAPSULE | ORAL | 0 refills | Status: DC
  Filled 2021-05-10: qty 12, 84d supply, fill #0

## 2021-05-10 MED ORDER — TIRZEPATIDE 5 MG/0.5ML ~~LOC~~ SOAJ
5.0000 mg | SUBCUTANEOUS | 2 refills | Status: DC
  Filled 2021-05-10: qty 2, 28d supply, fill #0

## 2021-05-10 MED ORDER — FLUCONAZOLE 150 MG PO TABS
ORAL_TABLET | ORAL | 0 refills | Status: DC
  Filled 2021-05-10: qty 7, 30d supply, fill #0

## 2021-05-10 MED ORDER — CLINDAMYCIN HCL 300 MG PO CAPS
300.0000 mg | ORAL_CAPSULE | Freq: Three times a day (TID) | ORAL | 0 refills | Status: AC
  Filled 2021-05-10: qty 30, 10d supply, fill #0

## 2021-05-10 NOTE — Progress Notes (Signed)
Chief Complaint:   OBESITY [name] [name] is here to discuss her progress with her obesity treatment plan along with follow-up of her obesity related diagnoses. See Medical Weight Management Flowsheet for complete bioelectrical impedance results.  Today's visit was #: 20 Starting weight: 243 lbs Starting date: 06/18/2019 Weight change since last visit: 10 lbs Total lbs lost to date: 39 lbs Total weight loss percentage to date: -16.05%  Nutrition Plan: Category 1 Plan for 85% of the time.  Activity: Cardio/resistance training for 30-45 minutes 3-4 times per week.  Anti-obesity medications: Mounjaro 5 mg subcutaneously weekly. Reported side effects: None.  Interim History: [name] [name] meets with Plastic Surgery next Tuesday.  She says she has more irritation at her skin folds.  Assessment/Plan:   1. Intertrigo Severe and recurrent. Evidence of secondary bacterial infection right lower side.   Plan:  Start Diflucan 150 mg every 72 hours x3 doses, then 150 mg weekly for 1 month and clindamycin 300 mg three times daily for 10 days.  - Start clindamycin (CLEOCIN) 300 MG capsule; Take 1 capsule (300 mg total) by mouth 3 (three) times daily for 10 days.  Dispense: 30 capsule; Refill: 0 - Start fluconazole (DIFLUCAN) 150 MG tablet; Take 1 tablet every 72 hours for 3 doses, then weekly for 1 month  Dispense: 7 tablet; Refill: 0  2. Type 2 diabetes mellitus without complication, without long-term current use of insulin (HCC) Diabetes Mellitus: At goal. Medication: Mounjaro 5 mg subcutaneously weekly. Issues reviewed: blood sugar goals, complications of diabetes mellitus, hypoglycemia prevention and treatment, exercise, and nutrition.  Plan:  Continue Mounjaro 5 mg subcutaneously weekly, as per below. The patient will continue to focus on protein-rich, low simple carbohydrate foods. We reviewed the importance of hydration, regular exercise for stress reduction, and restorative sleep.   Lab Results   Component Value Date   HGBA1C 5.7 (H) 09/23/2020   HGBA1C 5.8 (H) 08/18/2020   HGBA1C 5.6 10/14/2019   Lab Results  Component Value Date   MICROALBUR 10 09/23/2020   LDLCALC 144 (H) 08/18/2020   CREATININE 0.90 09/23/2020   - Refill tirzepatide (MOUNJARO) 5 MG/0.5ML Pen; Inject 5 mg into the skin once a week.  Dispense: 2 mL; Refill: 2  3. Vitamin D deficiency Improving, but not optimized. She is taking vitamin D 50,000 IU weekly.  Plan: Continue to take prescription Vitamin D @50 ,000 IU every week as prescribed.  Follow-up for routine testing of Vitamin D, at least 2-3 times per year to avoid over-replacement.  Lab Results  Component Value Date   VD25OH 48.6 08/18/2020   VD25OH 43.7 10/14/2019   VD25OH 33.3 06/18/2019   - Refill Vitamin D, Ergocalciferol, (DRISDOL) 1.25 MG (50000 UNIT) CAPS capsule; TAKE 1 CAPSULE BY MOUTH EVERY 7 DAYS  Dispense: 12 capsule; Refill: 0  4. Obesity, current BMI 31.1  Course: [name] [name] is currently in the action stage of change. As such, her goal is to continue with weight loss efforts.   Nutrition goals: She has agreed to the Category 1 Plan.   Exercise goals:  As is.  Behavioral modification strategies: increasing lean protein intake, decreasing simple carbohydrates, increasing vegetables, and increasing water intake.  [name] [name] has agreed to follow-up with our clinic in 4 weeks. She was informed of the importance of frequent follow-up visits to maximize her success with intensive lifestyle modifications for her multiple health conditions.   Objective:   Blood pressure (!) 92/58, pulse 82, temperature 98.1 F (36.7 C), temperature source Oral, height  5\' 8"  (1.727 m), weight 204 lb (92.5 kg), SpO2 97 %. Body mass index is 31.02 kg/m.  General: Cooperative, alert, well developed, in no acute distress. HEENT: Conjunctivae and lids unremarkable. Cardiovascular: Regular rhythm.  Lungs: Normal work of breathing. Neurologic: No focal deficits.    Lab Results  Component Value Date   CREATININE 0.90 09/23/2020   BUN 13 09/23/2020   NA 140 09/23/2020   K 4.1 09/23/2020   CL 103 09/23/2020   CO2 21 09/23/2020   Lab Results  Component Value Date   ALT 12 09/23/2020   AST 14 09/23/2020   ALKPHOS 71 09/23/2020   BILITOT 0.3 09/23/2020   Lab Results  Component Value Date   HGBA1C 5.7 (H) 09/23/2020   HGBA1C 5.8 (H) 08/18/2020   HGBA1C 5.6 10/14/2019   HGBA1C 5.7 (H) 06/18/2019   HGBA1C 5.3 01/08/2019   Lab Results  Component Value Date   INSULIN 11.0 08/18/2020   INSULIN 11.9 10/14/2019   INSULIN 10.1 06/18/2019   Lab Results  Component Value Date   TSH 1.000 09/23/2020   Lab Results  Component Value Date   CHOL 205 (H) 08/18/2020   HDL 49 08/18/2020   LDLCALC 144 (H) 08/18/2020   LDLDIRECT 141 (H) 09/23/2020   TRIG 65 08/18/2020   CHOLHDL 4.2 08/18/2020   Lab Results  Component Value Date   VD25OH 48.6 08/18/2020   VD25OH 43.7 10/14/2019   VD25OH 33.3 06/18/2019   Lab Results  Component Value Date   WBC 8.4 01/13/2021   HGB 14.2 01/13/2021   HCT 41.5 01/13/2021   MCV 87.6 01/13/2021   PLT 285 01/13/2021   Lab Results  Component Value Date   FERRITIN 9 (L) 03/14/2016   Attestation Statements:   Reviewed by clinician on day of visit: allergies, medications, problem list, medical history, surgical history, family history, social history, and previous encounter notes.  I, Water quality scientist, CMA, am acting as transcriptionist for Briscoe Deutscher, DO  I have reviewed the above documentation for accuracy and completeness, and I agree with the above. -  Briscoe Deutscher, DO, MS, FAAFP, DABOM - Family and Bariatric Medicine.

## 2021-05-12 NOTE — Progress Notes (Signed)
[facility] Summary Report / Loop Recorder 

## 2021-05-16 ENCOUNTER — Other Ambulatory Visit: Payer: Self-pay

## 2021-05-16 ENCOUNTER — Encounter: Payer: Self-pay | Admitting: Plastic Surgery

## 2021-05-16 ENCOUNTER — Ambulatory Visit: Payer: No Typology Code available for payment source | Admitting: Plastic Surgery

## 2021-05-16 VITALS — BP 98/62 | HR 71 | Ht 68.0 in | Wt 205.0 lb

## 2021-05-16 DIAGNOSIS — K8500 Idiopathic acute pancreatitis without necrosis or infection: Secondary | ICD-10-CM | POA: Diagnosis not present

## 2021-05-16 DIAGNOSIS — M545 Low back pain, unspecified: Secondary | ICD-10-CM

## 2021-05-16 DIAGNOSIS — E119 Type 2 diabetes mellitus without complications: Secondary | ICD-10-CM | POA: Diagnosis not present

## 2021-05-16 DIAGNOSIS — M25511 Pain in right shoulder: Secondary | ICD-10-CM

## 2021-05-16 DIAGNOSIS — G8929 Other chronic pain: Secondary | ICD-10-CM

## 2021-05-16 NOTE — Progress Notes (Signed)
[name], female    DOB: [birth year], 41 y.o.   MRN: [id number]   Chief Complaint  Patient presents with   Consult    The patient is a 41 year old female here for evaluation of her abdomen.  She is 5 feet 8 inches tall and weighs 205 pounds.  Her starting weight was 337 pounds.  She had, cholecystectomy, and GYN surgery.  She is prediabetic and her last hemoglobin A1c was 5.7 in May 2022 she complains of back and neck pain.  She has continual rashes in her skin folds she is interested in a panniculectomy and lifting of her mons.  She used to be a 38 DDD and is now a 30 6D/DD.  She is not a smoker.  She is not on blood thinners.  She has not been to physical therapy but is willing no sign of hernia.   Review of Systems  Constitutional: Negative.   HENT: Negative.    Eyes: Negative.   Respiratory: Negative.    Cardiovascular: Negative.   Gastrointestinal: Negative.   Endocrine: Negative.   Genitourinary: Negative.   Musculoskeletal:  Positive for back pain and neck pain.  Skin:  Positive for color change and rash.  Hematological: Negative.   Psychiatric/Behavioral: Negative.     Past Medical History:  Diagnosis Date   Abnormal uterine bleeding    Amenorrhea    Anemia    Anxiety    panic attacks   Back pain    Bradycardia    Chest pain    Constipation    COVID 02/2020   Diabetes ([place])    Dysmenorrhea    Endometriosis    Gallbladder problem    GERD (gastroesophageal reflux disease)    occasional - diet controlled and tums prn   HLD (hyperlipidemia)    HTN (hypertension)    IBS (irritable bowel syndrome)    Infertility, female    Insulin resistance    Migraine without aura    Missed abortion    x 2 - both resolved without surgery   Obesity    PCOS (polycystic ovarian syndrome)    PCOS (polycystic ovarian syndrome)    PONV (postoperative nausea and vomiting)    pe rpatient ; scopalamine patch very helpful    Sleep apnea    SVD (spontaneous  vaginal delivery)    x 1   Syncope and collapse 06/21/2020   Urinary incontinence    Vitamin D deficiency     Past Surgical History:  Procedure Laterality Date   CHOLECYSTECTOMY     COLONOSCOPY  2017   polyps   CYSTOSCOPY N/A 10/29/2016   Procedure: CYSTOSCOPY;  Surgeon: Salvadore Dom, MD;  Location: [facility];  Service: Gynecology;  Laterality: N/A;   DILATATION & CURRETTAGE/HYSTEROSCOPY WITH RESECTOCOPE N/A 10/17/2012   Procedure: DILATATION & CURETTAGE/HYSTEROSCOPY WITH RESECTOCOPE; Possible Polypectomy, Possible Resectoscopic Myomectomy.;  Surgeon: Floyce Stakes. Pamala Hurry, MD;  Location: [facility];  Service: Gynecology;  Laterality: N/A;  1 hr.   DILATION AND CURETTAGE OF UTERUS     ENDOMETRIAL BIOPSY     ENDOVENOUS ABLATION SAPHENOUS VEIN W/ LASER Right 05/08/2018   endovenous laser ablation right greater saphenous vein and stab phlebectomy > 20 incisions right leg by Deitra Mayo MD    ESOPHAGOGASTRODUODENOSCOPY ENDOSCOPY  2017   Normal   HYSTEROSCOPY WITH D & C N/A 12/01/2013   Procedure: IUD Removal;  Surgeon: Floyce Stakes. Pamala Hurry, MD;  Location: [name];  Service: Gynecology;  Laterality: N/A;  90 min.   INTRAUTERINE DEVICE (IUD) INSERTION N/A 10/17/2012   Procedure: INTRAUTERINE DEVICE (IUD) INSERTION; Mirena;  Surgeon: Floyce Stakes. Pamala Hurry, MD;  Location: [facility];  Service: Gynecology;  Laterality: N/A;   LAPAROSCOPIC GASTRIC SLEEVE RESECTION N/A 02/19/2017   Procedure: LAPAROSCOPIC GASTRIC SLEEVE RESECTION, UPPER ENDOSCOPY;  Surgeon: Johnathan Hausen, MD;  Location: [facility];  Service: General;  Laterality: N/A;   LAPAROSCOPIC SALPINGO OOPHERECTOMY Left 10/29/2016   Procedure: LAPAROSCOPIC SALPINGO OOPHORECTOMY With anterior uterine biopsy;  Surgeon: Salvadore Dom, MD;  Location: [facility];  Service: Gynecology;  Laterality: Left;   LASIK  10/06/2018   LOOP RECORDER INSERTION N/A 06/22/2020   Procedure: LOOP RECORDER INSERTION;  Surgeon: Evans Lance, MD;  Location: [facility];  Service: Cardiovascular;  Laterality: N/A;   MOUTH SURGERY     wisdom teeth      Current Outpatient Medications:    albuterol (VENTOLIN HFA) 108 (90 Base) MCG/ACT inhaler, Inhale 2 puffs into the lungs every 6 (six) hours as needed for wheezing or shortness of breath., Disp: 18 g, Rfl: 0   APPLE CIDER VINEGAR PO, Take 1 tablet by mouth daily., Disp: , Rfl:    ascorbic acid (VITAMIN C) 500 MG tablet, Take 500 mg by mouth daily., Disp: , Rfl:    Biotin w/ Vitamins C & E (HAIR/SKIN/NAILS PO), Take 1 tablet by mouth daily. , Disp: , Rfl:    Chromium Picolinate 200 MCG CAPS, Take 1 capsule by mouth daily., Disp: , Rfl:    clindamycin (CLEOCIN) 300 MG capsule, Take 1 capsule (300 mg total) by mouth 3 (three) times daily for 10 days., Disp: 30 capsule, Rfl: 0   clonazePAM (KLONOPIN) 0.5 MG tablet, Take 1 tablet (0.5 mg total) by mouth 2 (two) times daily as needed for anxiety., Disp: 20 tablet, Rfl: 1   Coenzyme Q10 (COQ10) 100 MG CAPS, Take 1 capsule by mouth daily., Disp: , Rfl:    Continuous Blood Gluc Sensor (DEXCOM G6 SENSOR) MISC, Use to monitor blood sugar, Disp: 3 each, Rfl: 0   Continuous Blood Gluc Transmit (DEXCOM G6 TRANSMITTER) MISC, Use as directed, Disp: 1 each, Rfl: 0   cyclobenzaprine (FLEXERIL) 10 MG tablet, Take 1 tablet (10 mg total) by mouth 2 (two) times daily as needed for muscle spasms., Disp: 30 tablet, Rfl: 0   Docosahexaenoic Acid (DHA COMPLETE PO), Take 1 tablet by mouth daily., Disp: , Rfl:    fluconazole (DIFLUCAN) 150 MG tablet, Take 1 tablet every 72 hours for 3 doses, then weekly for 1 month, Disp: 7 tablet, Rfl: 0   levonorgestrel (MIRENA) 20 MCG/24HR IUD, 1 each by Intrauterine route once., Disp: , Rfl:    loratadine (CLARITIN) 10 MG tablet, Take 10 mg by mouth daily as needed for allergies., Disp: , Rfl:    Multiple Vitamins-Minerals (BARIATRIC MULTIVITAMINS/IRON PO), Take 1 tablet by mouth in the morning and at bedtime., Disp: , Rfl:    nystatin ointment  (MYCOSTATIN), Apply  topically 2  times daily., Disp: 30 g, Rfl: 0   ondansetron (ZOFRAN-ODT) 4 MG disintegrating tablet, Dissolve 1 tablet by mouth every 8 hours as needed for nausea or vomiting., Disp: 20 tablet, Rfl: 0   Probiotic Product (PROBIOTIC ADVANCED PO), Take 1 capsule by mouth daily. , Disp: , Rfl:    tirzepatide (MOUNJARO) 5 MG/0.5ML Pen, Inject 5 mg into the skin once a week., Disp: 2 mL, Rfl: 2   Vitamin D, Ergocalciferol, (DRISDOL) 1.25 MG (50000 UNIT) CAPS capsule,  TAKE 1 CAPSULE BY MOUTH EVERY 7 DAYS, Disp: 12 capsule, Rfl: 0   SUMAtriptan (IMITREX) 50 MG tablet, TAKE 1 TABLET BY MOUTH AT ONSET OF MIGRAINE, MAY REPEAT IN 2 HOURS IF HEADACHE PERSISTS (Patient taking differently: Take 50 mg by mouth every 2 (two) hours as needed for migraine.), Disp: 10 tablet, Rfl: 0   Objective:   Vitals:   05/16/21 1056  BP: 98/62  Pulse: 71  SpO2: 100%    Physical Exam Vitals and nursing note reviewed.  Constitutional:      Appearance: Normal appearance.  HENT:     Head: Normocephalic and atraumatic.  Cardiovascular:     Rate and Rhythm: Normal rate.     Pulses: Normal pulses.  Pulmonary:     Effort: Pulmonary effort is normal.  Abdominal:     General: There is no distension.     Palpations: Abdomen is soft. There is no mass.     Tenderness: There is no abdominal tenderness.     Hernia: No hernia is present.  Musculoskeletal:        General: No swelling or deformity.  Skin:    General: Skin is warm.     Capillary Refill: Capillary refill takes less than 2 seconds.     Coloration: Skin is not jaundiced.     Findings: No bruising.  Neurological:     Mental Status: She is alert and oriented to person, place, and time.  Psychiatric:        Mood and Affect: Mood normal.        Behavior: Behavior normal.        Thought Content: Thought content normal.        Judgment: Judgment normal.    Assessment & Plan:  Chronic bilateral low back pain, unspecified whether sciatica  present  Acute pain of right shoulder  The patient will send [place] her primary care and GYN notes describing her rashes.  She is a good candidate for a panniculectomy with lifting of the mons.  She may want a quote for abdominoplasty at on.   [place], DO

## 2021-05-17 ENCOUNTER — Other Ambulatory Visit (HOSPITAL_COMMUNITY): Payer: Self-pay

## 2021-05-19 NOTE — Addendum Note (Signed)
Addended by: Douglass Rivers D on: 05/19/2021 03:33 PM   Modules accepted: Level of Service

## 2021-05-21 ENCOUNTER — Encounter: Payer: Self-pay | Admitting: Obstetrics and Gynecology

## 2021-05-23 ENCOUNTER — Other Ambulatory Visit (HOSPITAL_COMMUNITY): Payer: Self-pay

## 2021-05-25 NOTE — Telephone Encounter (Signed)
Spoke with patient.  Patient is planning surgery as mention in MyChart message below. She had weight loss surgery 02/2017, reports problem related to excess skin started in the end on 2019. Surgery is not scheduled, but process has been started, requesting letter of medical necessity. Advised will review required clinical information from insurance company for these procedures and review with Dr. Talbert Nan, will f/u once completed. Patient is aware if she needs medical records she can return call to office to complete ROI. Questions answered.

## 2021-06-05 ENCOUNTER — Emergency Department (HOSPITAL_COMMUNITY): Payer: No Typology Code available for payment source

## 2021-06-05 ENCOUNTER — Emergency Department (HOSPITAL_COMMUNITY)
Admission: EM | Admit: 2021-06-05 | Discharge: 2021-06-06 | Disposition: A | Discharge status: Home / Self Care | Payer: No Typology Code available for payment source | Attending: Emergency Medicine | Admitting: Emergency Medicine

## 2021-06-05 DIAGNOSIS — R001 Bradycardia, unspecified: Secondary | ICD-10-CM | POA: Diagnosis not present

## 2021-06-05 DIAGNOSIS — R0602 Shortness of breath: Secondary | ICD-10-CM | POA: Insufficient documentation

## 2021-06-05 DIAGNOSIS — R079 Chest pain, unspecified: Secondary | ICD-10-CM | POA: Insufficient documentation

## 2021-06-05 DIAGNOSIS — R42 Dizziness and giddiness: Secondary | ICD-10-CM | POA: Insufficient documentation

## 2021-06-05 DIAGNOSIS — Z5321 Procedure and treatment not carried out due to patient leaving prior to being seen by health care provider: Secondary | ICD-10-CM | POA: Insufficient documentation

## 2021-06-05 DIAGNOSIS — R110 Nausea: Secondary | ICD-10-CM | POA: Diagnosis not present

## 2021-06-05 LAB — CBC
HCT: 39 % (ref 36.0–46.0)
Hemoglobin: 13.4 g/dL (ref 12.0–15.0)
MCH: 29.9 pg (ref 26.0–34.0)
MCHC: 34.4 g/dL (ref 30.0–36.0)
MCV: 87.1 fL (ref 80.0–100.0)
Platelets: 274 10*3/uL (ref 150–400)
RBC: 4.48 MIL/uL (ref 3.87–5.11)
RDW: 12.3 % (ref 11.5–15.5)
WBC: 8.4 10*3/uL (ref 4.0–10.5)
nRBC: 0 % (ref 0.0–0.2)

## 2021-06-05 LAB — TROPONIN I (HIGH SENSITIVITY): Troponin I (High Sensitivity): <2 ng/L (ref <18)

## 2021-06-05 LAB — I-STAT BETA HCG BLOOD, ED (MC, WL, AP ONLY): I-stat hCG, quantitative: <5 m[IU]/mL (ref <5)

## 2021-06-05 LAB — BASIC METABOLIC PANEL
Anion gap: 7 (ref 5–15)
BUN: 14 mg/dL (ref 6–20)
CO2: 25 mmol/L (ref 22–32)
Calcium: 9.1 mg/dL (ref 8.9–10.3)
Chloride: 106 mmol/L (ref 98–111)
Creatinine, Ser: 1.02 mg/dL — ABNORMAL HIGH (ref 0.44–1.00)
GFR, Estimated: >60 mL/min (ref >60)
Glucose, Bld: 90 mg/dL (ref 70–99)
Potassium: 3.8 mmol/L (ref 3.5–5.1)
Sodium: 138 mmol/L (ref 135–145)

## 2021-06-05 NOTE — ED Notes (Signed)
Updated pt on status.

## 2021-06-05 NOTE — ED Triage Notes (Signed)
Patient ambulatory to triage. C/o CP beginning today around 7am. Patient has felt periods of slow HR and rapid HR throughout the day. Radiates into jaw.  Patient denies SOB but does endorse nausea and dizziness.  Patient has an internal loop recorder for hx of bradycardia.

## 2021-06-05 NOTE — ED Provider Triage Note (Signed)
Emergency Medicine Provider Triage Evaluation Note  [name] [name] , a 41 y.o. female  was evaluated in triage.  Pt complains of chest pain starting at 7 AM this morning.  Patient states that she has felt periods of her heart beating really slowly, and then rapidly.  She states that her pain radiates to her jaw.  She has a internal loop recorder for history of bradycardia.  Review of Systems  Positive: Chest pain, shortness of breath, nausea, dizziness Negative: syncope  Physical Exam  BP 106/66    Pulse 66    Temp 97.8 F (36.6 C) (Oral)    Resp 16    SpO2 100%  Gen:   Awake, no distress   Resp:  Normal effort  MSK:   Moves extremities without difficulty  Other:    Medical Decision Making  Medically screening exam initiated at 9:06 PM.  Appropriate orders placed.  [name] [name] was informed that the remainder of the evaluation will be completed by another provider, this initial triage assessment does not replace that evaluation, and the importance of remaining in the ED until their evaluation is complete.     Kateri Plummer, Hershal Coria 06/05/21 2113

## 2021-06-06 LAB — TROPONIN I (HIGH SENSITIVITY): Troponin I (High Sensitivity): <2 ng/L (ref <18)

## 2021-06-06 NOTE — ED Notes (Signed)
Called for vitals x2

## 2021-06-06 NOTE — ED Notes (Signed)
No answer from pt in waiting room 

## 2021-06-07 ENCOUNTER — Other Ambulatory Visit (HOSPITAL_COMMUNITY): Payer: Self-pay

## 2021-06-07 ENCOUNTER — Other Ambulatory Visit (INDEPENDENT_AMBULATORY_CARE_PROVIDER_SITE_OTHER): Payer: Self-pay

## 2021-06-07 ENCOUNTER — Ambulatory Visit (INDEPENDENT_AMBULATORY_CARE_PROVIDER_SITE_OTHER): Payer: No Typology Code available for payment source | Admitting: Family Medicine

## 2021-06-07 ENCOUNTER — Encounter (INDEPENDENT_AMBULATORY_CARE_PROVIDER_SITE_OTHER): Payer: Self-pay

## 2021-06-07 DIAGNOSIS — F410 Panic disorder [episodic paroxysmal anxiety] without agoraphobia: Secondary | ICD-10-CM

## 2021-06-07 MED ORDER — CLONAZEPAM 0.5 MG PO TABS: 0.5000 mg | ORAL_TABLET | Freq: Two times a day (BID) | ORAL | 1 refills | Status: DC | PRN

## 2021-06-07 MED ORDER — CLONAZEPAM 0.5 MG PO TABS
0.5000 mg | ORAL_TABLET | Freq: Two times a day (BID) | ORAL | 1 refills | Status: DC | PRN
  Filled 2021-06-07: qty 20, 10d supply, fill #0

## 2021-06-08 NOTE — Telephone Encounter (Signed)
Reviewed with Dr. Talbert Nan. Unable to provide letter as requested by patient.   Call returned to patient. Advised unable to provide letter of medical necessity as requested by patient. Patient verbalizes understanding.   Routing to provider for final review. Patient is agreeable to disposition. Will close encounter.

## 2021-06-12 ENCOUNTER — Ambulatory Visit (INDEPENDENT_AMBULATORY_CARE_PROVIDER_SITE_OTHER): Payer: No Typology Code available for payment source

## 2021-06-12 DIAGNOSIS — R55 Syncope and collapse: Secondary | ICD-10-CM | POA: Diagnosis not present

## 2021-06-13 ENCOUNTER — Telehealth: Payer: Self-pay | Admitting: Medical

## 2021-06-13 ENCOUNTER — Other Ambulatory Visit (HOSPITAL_COMMUNITY): Payer: Self-pay

## 2021-06-13 ENCOUNTER — Other Ambulatory Visit: Payer: Self-pay

## 2021-06-13 ENCOUNTER — Ambulatory Visit (INDEPENDENT_AMBULATORY_CARE_PROVIDER_SITE_OTHER): Payer: No Typology Code available for payment source | Admitting: Family Medicine

## 2021-06-13 ENCOUNTER — Encounter (INDEPENDENT_AMBULATORY_CARE_PROVIDER_SITE_OTHER): Payer: Self-pay | Admitting: Family Medicine

## 2021-06-13 VITALS — BP 92/59 | HR 69 | Temp 98.1°F | Ht 68.0 in | Wt 194.0 lb

## 2021-06-13 DIAGNOSIS — Z7985 Long-term (current) use of injectable non-insulin antidiabetic drugs: Secondary | ICD-10-CM

## 2021-06-13 DIAGNOSIS — K582 Mixed irritable bowel syndrome: Secondary | ICD-10-CM

## 2021-06-13 DIAGNOSIS — L304 Erythema intertrigo: Secondary | ICD-10-CM

## 2021-06-13 DIAGNOSIS — E669 Obesity, unspecified: Secondary | ICD-10-CM

## 2021-06-13 DIAGNOSIS — E65 Localized adiposity: Secondary | ICD-10-CM

## 2021-06-13 DIAGNOSIS — F411 Generalized anxiety disorder: Secondary | ICD-10-CM

## 2021-06-13 DIAGNOSIS — Z6829 Body mass index (BMI) 29.0-29.9, adult: Secondary | ICD-10-CM

## 2021-06-13 DIAGNOSIS — E119 Type 2 diabetes mellitus without complications: Secondary | ICD-10-CM

## 2021-06-13 DIAGNOSIS — E1169 Type 2 diabetes mellitus with other specified complication: Secondary | ICD-10-CM | POA: Diagnosis not present

## 2021-06-13 LAB — CUP PACEART REMOTE DEVICE CHECK
Date Time Interrogation Session: 20230205231031
Implantable Pulse Generator Implant Date: 20220216

## 2021-06-13 MED ORDER — DICYCLOMINE HCL 20 MG PO TABS
20.0000 mg | ORAL_TABLET | Freq: Four times a day (QID) | ORAL | 0 refills | Status: DC
  Filled 2021-06-13: qty 120, 30d supply, fill #0

## 2021-06-13 MED ORDER — SERTRALINE HCL 50 MG PO TABS
50.0000 mg | ORAL_TABLET | Freq: Every day | ORAL | 3 refills | Status: DC
  Filled 2021-06-13: qty 30, 30d supply, fill #0

## 2021-06-13 MED ORDER — TIRZEPATIDE 5 MG/0.5ML ~~LOC~~ SOAJ
5.0000 mg | SUBCUTANEOUS | 3 refills | Status: DC
  Filled 2021-06-13: qty 2, 28d supply, fill #0

## 2021-06-13 NOTE — Telephone Encounter (Signed)
Dismissal letter in guarantor snapshot  °

## 2021-06-14 ENCOUNTER — Encounter: Payer: Self-pay | Admitting: Plastic Surgery

## 2021-06-14 NOTE — Progress Notes (Addendum)
Chief Complaint:   OBESITY [name] [name] is here to discuss her progress with her obesity treatment plan along with follow-up of her obesity related diagnoses. See Medical Weight Management Flowsheet for complete bioelectrical impedance results.  Today's visit was #: 21 Starting weight: 243 lbs Starting date: 06/18/2019 Weight change since last visit: 10 lbs Total lbs lost to date: 49 lbs Total weight loss percentage to date: -20.16%  Nutrition Plan: Category 1 Plan for 80% of the time.  Activity: Cardio/strength training for 20-30 minutes 3-5 times per week. Anti-obesity medications: Mounjaro 5 mg subcutaneously weekly. Reported side effects: None.  Interim History: [name] [name] says she has worsened anxiety last week and went to the ED.  She waited 15 hours in the waiting room.  She says that her mother-in-law and father were recently diagnosed with cancer.  Her job stress is worsening.  She is using Klonopin as needed.  Assessment/Plan:   1. Type 2 diabetes mellitus without complication, without long-term current use of insulin (HCC) Diabetes Mellitus: At goal. Medication: Mounjaro 5 mg subcutaneously weekly. Issues reviewed: blood sugar goals, complications of diabetes mellitus, hypoglycemia prevention and treatment, exercise, and nutrition.  Plan:  Continue Mounjaro 5 mg subcutaneously weekly. The patient will continue to focus on protein-rich, low simple carbohydrate foods. We reviewed the importance of hydration, regular exercise for stress reduction, and restorative sleep.   Lab Results  Component Value Date   HGBA1C 5.7 (H) 09/23/2020   HGBA1C 5.8 (H) 08/18/2020   HGBA1C 5.6 10/14/2019   Lab Results  Component Value Date   MICROALBUR 10 09/23/2020   LDLCALC 144 (H) 08/18/2020   CREATININE 1.02 (H) 06/05/2021   - Refill tirzepatide (MOUNJARO) 5 MG/0.5ML Pen; Inject 1 pen (5 mg) into the skin once a week.  Dispense: 2 mL; Refill: 3  2. Irritable bowel syndrome with both  constipation and diarrhea The cause is not completely understood, but is likely to be a combination of genetics, diet, stress, visceral hypersensitivity and the gut microbiome.  The available treatments aim to control symptoms even if unable to "cure" the condition.  While not physically harmful, IBS can have a significant impact on quality of life.  Plan:  Start Bentyl 20 mg every 6 hours.  - Start dicyclomine (BENTYL) 20 MG tablet; Take 1 tablet by mouth every 6  hours.  Dispense: 120 tablet; Refill: 0  3. Abdominal pannus [name] [name] has been to see Plastic Surgery and is working on getting a panniculectomy. See letter.  ADDENDUM (06/15/21): [name] [name] presents with worsening redness, swelling, and pain at her umbilicus, lower pannus, and thigh c/w candidal infection. Her pain level is moderate to high, decreasing her ability to move around. She has obvious discomfort with walking and sitting. She is very tender to touch. She has already tried Nystatin, hygiene measures. Will refill Nystatin ointment and start Diflucan to take weekly x 1 month.   4. Generalized anxiety disorder With panic attacks.  She takes Klonopin 0.5 mg as needed.  Plan:  Continue Klonopin and start Zoloft 50 mg daily.  - Start sertraline (ZOLOFT) 50 MG tablet; Take 1 tablet by mouth daily.  Dispense: 30 tablet; Refill: 3  5. Obesity, current BMI 29.6  Course: [name] [name] is currently in the action stage of change. As such, her goal is to continue with weight loss efforts.   Nutrition goals: She has agreed to the Category 1 Plan.   Exercise goals:  As is.  Behavioral modification strategies: increasing lean protein intake, decreasing  simple carbohydrates, increasing vegetables, increasing water intake, and decreasing liquid calories.  [name] [name] has agreed to follow-up with our clinic in 2 weeks. She was informed of the importance of frequent follow-up visits to maximize her success with intensive lifestyle modifications for her  multiple health conditions.   Objective:   Blood pressure (!) 92/59, pulse 69, temperature 98.1 F (36.7 C), temperature source Oral, height 5\' 8"  (1.727 m), weight 194 lb (88 kg), SpO2 99 %. Body mass index is 29.5 kg/m.  General: Cooperative, alert, well developed, in no acute distress. HEENT: Conjunctivae and lids unremarkable. Cardiovascular: Regular rhythm.  Lungs: Normal work of breathing. Neurologic: No focal deficits.   Lab Results  Component Value Date   CREATININE 1.02 (H) 06/05/2021   BUN 14 06/05/2021   NA 138 06/05/2021   K 3.8 06/05/2021   CL 106 06/05/2021   CO2 25 06/05/2021   Lab Results  Component Value Date   ALT 12 09/23/2020   AST 14 09/23/2020   ALKPHOS 71 09/23/2020   BILITOT 0.3 09/23/2020   Lab Results  Component Value Date   HGBA1C 5.7 (H) 09/23/2020   HGBA1C 5.8 (H) 08/18/2020   HGBA1C 5.6 10/14/2019   HGBA1C 5.7 (H) 06/18/2019   HGBA1C 5.3 01/08/2019   Lab Results  Component Value Date   INSULIN 11.0 08/18/2020   INSULIN 11.9 10/14/2019   INSULIN 10.1 06/18/2019   Lab Results  Component Value Date   TSH 1.000 09/23/2020   Lab Results  Component Value Date   CHOL 205 (H) 08/18/2020   HDL 49 08/18/2020   LDLCALC 144 (H) 08/18/2020   LDLDIRECT 141 (H) 09/23/2020   TRIG 65 08/18/2020   CHOLHDL 4.2 08/18/2020   Lab Results  Component Value Date   VD25OH 48.6 08/18/2020   VD25OH 43.7 10/14/2019   VD25OH 33.3 06/18/2019   Lab Results  Component Value Date   WBC 8.4 06/05/2021   HGB 13.4 06/05/2021   HCT 39.0 06/05/2021   MCV 87.1 06/05/2021   PLT 274 06/05/2021   Lab Results  Component Value Date   FERRITIN 9 (L) 03/14/2016   Attestation Statements:   Reviewed by clinician on day of visit: allergies, medications, problem list, medical history, surgical history, family history, social history, and previous encounter notes.  Time spent on visit including pre-visit chart review and post-visit care and documentation was  45 minutes.Time was spent on: Food choices and timing of food intake reviewed today. I discussed a personalized meal plan with the patient that will help her to lose weight and will improve her obesity-related conditions going forward. I performed a medically necessary appropriate examination and/or evaluation. I discussed the assessment and treatment plan with the patient. Motivational interviewing as well as evidence-based interventions for health behavior change were utilized today including the discussion of self monitoring techniques, problem-solving barriers and SMART goal setting techniques.  An exercise prescription was reviewed.  The patient was provided an opportunity to ask questions and all were answered. The patient agreed with the plan and demonstrated an understanding of the instructions. Clinical information was updated and documented in the EMR.  I, Water quality scientist, CMA, am acting as transcriptionist for Briscoe Deutscher, DO  I have reviewed the above documentation for accuracy and completeness, and I agree with the above. -  Briscoe Deutscher, DO, MS, FAAFP, DABOM - Family and Bariatric Medicine.

## 2021-06-15 ENCOUNTER — Other Ambulatory Visit (HOSPITAL_COMMUNITY): Payer: Self-pay

## 2021-06-15 MED ORDER — FLUCONAZOLE 150 MG PO TABS
150.0000 mg | ORAL_TABLET | ORAL | 2 refills | Status: DC
  Filled 2021-06-15: qty 4, 28d supply, fill #0

## 2021-06-15 MED ORDER — NYSTATIN 100000 UNIT/GM EX OINT
1.0000 "application " | TOPICAL_OINTMENT | Freq: Two times a day (BID) | CUTANEOUS | 6 refills | Status: DC
  Filled 2021-06-15: qty 30, 15d supply, fill #0

## 2021-06-15 NOTE — Progress Notes (Signed)
[facility] Summary Report / Loop Recorder 

## 2021-06-16 ENCOUNTER — Other Ambulatory Visit (HOSPITAL_COMMUNITY): Payer: Self-pay

## 2021-06-19 ENCOUNTER — Other Ambulatory Visit (HOSPITAL_COMMUNITY): Payer: Self-pay

## 2021-06-21 ENCOUNTER — Other Ambulatory Visit (INDEPENDENT_AMBULATORY_CARE_PROVIDER_SITE_OTHER): Payer: Self-pay

## 2021-06-21 ENCOUNTER — Encounter (HOSPITAL_BASED_OUTPATIENT_CLINIC_OR_DEPARTMENT_OTHER): Payer: Self-pay

## 2021-06-21 ENCOUNTER — Ambulatory Visit (HOSPITAL_BASED_OUTPATIENT_CLINIC_OR_DEPARTMENT_OTHER): Payer: No Typology Code available for payment source | Admitting: Cardiology

## 2021-06-21 ENCOUNTER — Other Ambulatory Visit (HOSPITAL_COMMUNITY): Payer: Self-pay

## 2021-06-21 DIAGNOSIS — E119 Type 2 diabetes mellitus without complications: Secondary | ICD-10-CM

## 2021-06-21 MED ORDER — TIRZEPATIDE 5 MG/0.5ML ~~LOC~~ SOAJ
5.0000 mg | SUBCUTANEOUS | 0 refills | Status: DC
  Filled 2021-06-21: qty 6, 84d supply, fill #0

## 2021-06-22 ENCOUNTER — Other Ambulatory Visit (INDEPENDENT_AMBULATORY_CARE_PROVIDER_SITE_OTHER): Payer: Self-pay | Admitting: Family Medicine

## 2021-06-26 ENCOUNTER — Encounter: Payer: Self-pay | Admitting: Obstetrics and Gynecology

## 2021-06-28 ENCOUNTER — Other Ambulatory Visit: Payer: Self-pay

## 2021-06-28 ENCOUNTER — Ambulatory Visit (INDEPENDENT_AMBULATORY_CARE_PROVIDER_SITE_OTHER): Payer: No Typology Code available for payment source | Admitting: Family Medicine

## 2021-06-28 ENCOUNTER — Encounter (INDEPENDENT_AMBULATORY_CARE_PROVIDER_SITE_OTHER): Payer: Self-pay | Admitting: Family Medicine

## 2021-06-28 VITALS — BP 89/57 | HR 60 | Temp 97.8°F | Ht 68.0 in | Wt 194.0 lb

## 2021-06-28 DIAGNOSIS — L304 Erythema intertrigo: Secondary | ICD-10-CM

## 2021-06-28 DIAGNOSIS — E559 Vitamin D deficiency, unspecified: Secondary | ICD-10-CM

## 2021-06-28 DIAGNOSIS — M549 Dorsalgia, unspecified: Secondary | ICD-10-CM

## 2021-06-28 DIAGNOSIS — E669 Obesity, unspecified: Secondary | ICD-10-CM

## 2021-06-28 DIAGNOSIS — E119 Type 2 diabetes mellitus without complications: Secondary | ICD-10-CM | POA: Diagnosis not present

## 2021-06-28 DIAGNOSIS — R5381 Other malaise: Secondary | ICD-10-CM

## 2021-06-28 DIAGNOSIS — Z7985 Long-term (current) use of injectable non-insulin antidiabetic drugs: Secondary | ICD-10-CM

## 2021-06-28 DIAGNOSIS — N39498 Other specified urinary incontinence: Secondary | ICD-10-CM

## 2021-06-28 DIAGNOSIS — R37 Sexual dysfunction, unspecified: Secondary | ICD-10-CM

## 2021-06-28 DIAGNOSIS — Z6829 Body mass index (BMI) 29.0-29.9, adult: Secondary | ICD-10-CM

## 2021-06-28 DIAGNOSIS — R238 Other skin changes: Secondary | ICD-10-CM

## 2021-06-28 DIAGNOSIS — R5383 Other fatigue: Secondary | ICD-10-CM

## 2021-06-28 DIAGNOSIS — Z9884 Bariatric surgery status: Secondary | ICD-10-CM

## 2021-06-30 ENCOUNTER — Telehealth: Payer: Self-pay | Admitting: *Deleted

## 2021-06-30 NOTE — Telephone Encounter (Signed)
Patient called and left message in triage voicemail, she is scheduled on 07/07/21 for IUD removal and re-insertion. She reports last time she was given a Rx for medication to help soften her cervix. Patient reports she does suffer from anxiety and doesn't handle pain well. She wanted to know if that pill was an option this time? Please advise

## 2021-07-02 MED ORDER — MISOPROSTOL 200 MCG PO TABS
ORAL_TABLET | ORAL | 0 refills | Status: DC
  Filled 2021-07-02: qty 2, 1d supply, fill #0

## 2021-07-02 NOTE — Telephone Encounter (Signed)
Please let the patient know that I called in cytotec for her.  She can take one of her klonopin's prior to the procedure for anxiety. 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 Is she wanting something stronger than tylenol? If so, I can call in some oxycodone. If she takes oxycodone, she will need a driver and will need to review and give verbal consent to the IUD insertion prior to taking the oxycodone.

## 2021-07-03 ENCOUNTER — Other Ambulatory Visit (HOSPITAL_COMMUNITY): Payer: Self-pay

## 2021-07-03 LAB — CBC WITH DIFFERENTIAL/PLATELET
Basophils Absolute: 0.1 10*3/uL (ref 0.0–0.2)
Basos: 1 %
EOS (ABSOLUTE): 0.2 10*3/uL (ref 0.0–0.4)
Eos: 2 %
Hemoglobin: 14 g/dL (ref 11.1–15.9)
Immature Grans (Abs): 0 10*3/uL (ref 0.0–0.1)
Immature Granulocytes: 0 %
Lymphocytes Absolute: 1.9 10*3/uL (ref 0.7–3.1)
Lymphs: 23 %
MCH: 29.7 pg (ref 26.6–33.0)
MCHC: 33.3 g/dL (ref 31.5–35.7)
MCV: 89 fL (ref 79–97)
Monocytes Absolute: 0.6 10*3/uL (ref 0.1–0.9)
Monocytes: 7 %
Neutrophils Absolute: 5.3 10*3/uL (ref 1.4–7.0)
Neutrophils: 67 %
Platelets: 255 10*3/uL (ref 150–450)
RBC: 4.72 x10E6/uL (ref 3.77–5.28)
RDW: 13 % (ref 11.7–15.4)
WBC: 8 10*3/uL (ref 3.4–10.8)

## 2021-07-03 LAB — ANEMIA PANEL
Ferritin: 76 ng/mL (ref 15–150)
Hematocrit: 42.1 % (ref 34.0–46.6)
Iron Saturation: 34 % (ref 15–55)
Iron: 73 ug/dL (ref 27–159)
Retic Ct Pct: 1.2 % (ref 0.6–2.6)
Total Iron Binding Capacity: 217 ug/dL — ABNORMAL LOW (ref 250–450)
UIBC: 144 ug/dL (ref 131–425)
Vitamin B-12: 230 pg/mL — ABNORMAL LOW (ref 232–1245)

## 2021-07-03 LAB — COMPREHENSIVE METABOLIC PANEL
ALT: 11 IU/L (ref 0–32)
AST: 13 IU/L (ref 0–40)
Albumin/Globulin Ratio: 1.4 (ref 1.2–2.2)
Albumin: 4.2 g/dL (ref 3.8–4.8)
Alkaline Phosphatase: 59 IU/L (ref 44–121)
BUN/Creatinine Ratio: 18 (ref 9–23)
BUN: 17 mg/dL (ref 6–24)
Bilirubin Total: 0.5 mg/dL (ref 0.0–1.2)
CO2: 24 mmol/L (ref 20–29)
Calcium: 9.3 mg/dL (ref 8.7–10.2)
Chloride: 102 mmol/L (ref 96–106)
Creatinine, Ser: 0.92 mg/dL (ref 0.57–1.00)
Globulin, Total: 3 g/dL (ref 1.5–4.5)
Glucose: 78 mg/dL (ref 70–99)
Potassium: 4.3 mmol/L (ref 3.5–5.2)
Sodium: 141 mmol/L (ref 134–144)
Total Protein: 7.2 g/dL (ref 6.0–8.5)
eGFR: 81 mL/min/{1.73_m2} (ref >59)

## 2021-07-03 LAB — HEMOGLOBIN A1C
Est. average glucose Bld gHb Est-mCnc: 103 mg/dL
Hgb A1c MFr Bld: 5.2 % (ref 4.8–5.6)

## 2021-07-03 LAB — LIPID PANEL
Chol/HDL Ratio: 3.3 ratio (ref 0.0–4.4)
Cholesterol, Total: 153 mg/dL (ref 100–199)
HDL: 47 mg/dL (ref >39)
LDL Chol Calc (NIH): 94 mg/dL (ref 0–99)
Triglycerides: 59 mg/dL (ref 0–149)
VLDL Cholesterol Cal: 12 mg/dL (ref 5–40)

## 2021-07-03 LAB — VITAMIN D 25 HYDROXY (VIT D DEFICIENCY, FRACTURES): Vit D, 25-Hydroxy: 83.7 ng/mL (ref 30.0–100.0)

## 2021-07-03 LAB — TSH: TSH: 1.55 u[IU]/mL (ref 0.450–4.500)

## 2021-07-03 NOTE — Progress Notes (Signed)
Chief Complaint:   OBESITY [name] [name] is here to discuss her progress with her obesity treatment plan along with follow-up of her obesity related diagnoses. See Medical Weight Management Flowsheet for complete bioelectrical impedance results.  Today's visit was #: 22 Starting weight: 243 lbs Starting date: 06/18/2019 Weight change since last visit: 0 Total lbs lost to date: 49 lbs Total weight loss percentage to date: -20.16%  Nutrition Plan: Category 1 Plan for 80% of the time.  Activity: Cardio/strength training for 60 minutes 3 times per week. Anti-obesity medications: Mounjaro 5 mg subcutaneously weekly. Reported side effects: None.  Interim History: [name] [name] presents today because of worsening rash, discomfort, odor, and bleeding abrasions in intertriginous folds due to excess skin at pannus, mons, umbilicus, and upper thighs. We recently had to advance treatment to oral medications. Today, like many others, she is obviously uncomfortable. She walks in a slightly forward position so that she does not stretch the skin. This has led to increased back pain. She continues to suffer from stress incontinence.  Assessment/Plan:   1. Other urinary incontinence Stress, inability to stop urine stream.  Worsened over the past 5 years.  Never tried medications but she did try pelvic floor PT.  Frequent UTI treatment at least 5-6 times in the past year.  Three times with me and another two to three times with GYN.  2. Back pain No injury.  Worsening over several years.  She tried PT for her back without significant improvement. This seems to be connected to posture dysfunction from excess abdominal skin.  3. Intertrigo [name] [name] is taking Diflucan 150 mg once weekly. She continues to work to keep the area clean and dry. She has been using nystatin regularly as well as a barrier cream.   4. Sexual dysfunction Anorgasmia. She reports excess skin impedes orgasm as well as causes poor self esteem,  which leads to poor libido.  5. Skin irritation As above. Hurts to exercise.  Clothes pinch/irritate the skin.  [name] [name] has actually burned her abdominal skin while leaning over the stove.   6. Type 2 diabetes mellitus without complication, without long-term current use of insulin (HCC) Diabetes Mellitus: At goal. Medication: Mounjaro 5 mg subcutaneously weekly. Issues reviewed: blood sugar goals, complications of diabetes mellitus, hypoglycemia prevention and treatment, exercise, and nutrition.  Plan: Continue Mounjaro 5 mg subcutaneously weekly.  The patient will continue to focus on protein-rich, low simple carbohydrate foods. We reviewed the importance of hydration, regular exercise for stress reduction, and restorative sleep.   Lab Results  Component Value Date   HGBA1C 5.7 (H) 09/23/2020   HGBA1C 5.8 (H) 08/18/2020   Lab Results  Component Value Date   MICROALBUR 10 09/23/2020   - Hemoglobin A1c - Lipid panel  7. Vitamin D deficiency Improving, but not optimized. She is taking vitamin D 50,000 IU weekly.  Plan: Continue to take prescription Vitamin D @50 ,000 IU every week as prescribed.  Follow-up for routine testing of Vitamin D, at least 2-3 times per year to avoid over-replacement.  Lab Results  Component Value Date   VD25OH 48.6 08/18/2020   VD25OH 43.7 10/14/2019   - VITAMIN D 25 Hydroxy (Vit-D Deficiency, Fractures)  8. Malaise and fatigue Check labs today, as per below.  - Anemia panel - Comprehensive metabolic panel - CBC with Differential/Platelet - TSH  9. History of bariatric surgery [name] [name] is at risk for malnutrition due to her previous bariatric surgery.   Counseling You may need to eat 3  meals and 2 snacks, or 5 small meals each day in order to reach your protein and calorie goals.  Allow at least 15 minutes for each meal so that you can eat mindfully. Listen to your body so that you do not overeat. For most people, your sleeve or pouch will  comfortably hold 4-6 ounces. Eat foods from all food groups. This includes fruits and vegetables, grains, dairy, and meat and other proteins. Include a protein-rich food at every meal and snack, and eat the protein food first.  You should be taking a Bariatric Multivitamin as well as calcium.    10. Obesity, current BMI 29.5  Course: [name] [name] is currently in the action stage of change. As such, her goal is to continue with weight loss efforts.   Nutrition goals: She has agreed to the Category 1 Plan.   Exercise goals:  As is.  Behavioral modification strategies: increasing lean protein intake, decreasing simple carbohydrates, and increasing vegetables.  [name] [name] has agreed to follow-up with our clinic in 4 weeks. She was informed of the importance of frequent follow-up visits to maximize her success with intensive lifestyle modifications for her multiple health conditions.   [name] [name] was informed we would discuss her lab results at her next visit unless there is a critical issue that needs to be addressed sooner. [name] [name] agreed to keep her next visit at the agreed upon time to discuss these results.  Objective:   Blood pressure (!) 89/57, pulse 60, temperature 97.8 F (36.6 C), temperature source Oral, height 5\' 8"  (1.727 m), weight 194 lb (88 kg), SpO2 100 %. Body mass index is 29.5 kg/m.  General: Cooperative, alert, well developed, in no acute distress. HEENT: Conjunctivae and lids unremarkable. Cardiovascular: Regular rhythm.  Lungs: Normal work of breathing. Neurologic: No focal deficits.   Lab Results  Component Value Date   HGBA1C 5.7 (H) 09/23/2020   HGBA1C 5.8 (H) 08/18/2020   HGBA1C 5.6 10/14/2019   HGBA1C 5.7 (H) 06/18/2019   Lab Results  Component Value Date   INSULIN 11.0 08/18/2020   INSULIN 11.9 10/14/2019   INSULIN 10.1 06/18/2019   Lab Results  Component Value Date   VD25OH 48.6 08/18/2020   VD25OH 43.7 10/14/2019   Attestation Statements:   Reviewed  by clinician on day of visit: allergies, medications, problem list, medical history, surgical history, family history, social history, and previous encounter notes.  I, Water quality scientist, CMA, am acting as transcriptionist for Briscoe Deutscher, DO  I have reviewed the above documentation for accuracy and completeness, and I agree with the above. -  Briscoe Deutscher, DO, MS, FAAFP, DABOM - Family and Bariatric Medicine.

## 2021-07-03 NOTE — Telephone Encounter (Signed)
Patient informed with below, she asked if a couple of oxycodone could be sent to pharmacy so she can take post IUD insertion. She will take the tylenol before appointment and post IUD insertion if oxycodone is needed she can just take it. She will have a driver with her just in case she needs it.

## 2021-07-04 ENCOUNTER — Other Ambulatory Visit: Payer: Self-pay | Admitting: Obstetrics and Gynecology

## 2021-07-04 ENCOUNTER — Other Ambulatory Visit (HOSPITAL_COMMUNITY): Payer: Self-pay

## 2021-07-04 MED ORDER — OXYCODONE HCL 5 MG PO TABS
5.0000 mg | ORAL_TABLET | ORAL | 0 refills | Status: DC | PRN
  Filled 2021-07-04: qty 5, 1d supply, fill #0

## 2021-07-04 NOTE — Progress Notes (Signed)
GYNECOLOGY  VISIT   HPI: 41 y.o.   Married White or Caucasian Not Hispanic or Latino  female   [phone] with Patient's last menstrual period was 06/22/2021.   here for mirena IUD exchange  Last negative STD testing was in 9/22. She is with the same partner.   GYNECOLOGIC HISTORY: Patient's last menstrual period was 06/22/2021. Contraception:IUD Menopausal hormone therapy: N/A        OB History     Gravida  3   Para  1   Term  1   Preterm      AB  2   Living  1      SAB  2   IAB      Ectopic      Multiple      Live Births  1              Patient Active Problem List   Diagnosis Date Noted   Panic attack as reaction to stress 81/19/1478   Complicated migraine with status migrainosus 02/15/2021   Bradycardia with 41-50 beats per minute 02/15/2021   Vertigo 02/15/2021   Excessive daytime sleepiness 02/15/2021   History of sleep apnea 01/17/2021   Stressful life event affecting family 01/17/2021   Impaired fasting blood sugar 01/17/2021   Acute pain of right shoulder 10/09/2020   Neck pain on right side 10/09/2020   Syncope and collapse 06/21/2020   Syncope, cardiogenic 06/21/2020   Near syncope 11/04/2019   Hyperlipidemia associated with type 2 diabetes mellitus ([place]) 11/04/2019   Prediabetes 10/15/2019   Vitamin D deficiency 08/06/2019   Constipation 08/06/2019   Endometriosis    Candidiasis, cutaneous 07/14/2018   Flu-like symptoms 07/09/2018   Pharyngitis 07/09/2018   GAD (generalized anxiety disorder) 06/02/2018   Dysuria 03/27/2018   Family history of parkinsonism 03/27/2018   Dizziness 03/27/2018   Bradycardia 03/23/2018   HLD (hyperlipidemia) 03/23/2018   Coronary atherosclerosis 03/23/2018   Chest pain 03/22/2018   Varicose vein of leg 12/24/2017   Venous reflux 12/24/2017   Leukocytosis 09/29/2017   Nausea 09/29/2017   Anxiety 09/29/2017   Pancreatitis 09/28/2017   Dehydration 03/12/2017   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 02/19/2017   Sleep apnea 01/09/2017   Healthcare maintenance 10/18/2016   Type 2 diabetes mellitus without complication, without long-term current use of insulin ([place]) 10/18/2016   LOW BACK PAIN, ACUTE 11/20/2006   POLYCYSTIC OVARY 07/04/2006   OBESITY, NOS 07/04/2006   TOBACCO USE, QUIT 07/04/2006    Past Medical History:  Diagnosis Date   Abnormal uterine bleeding    Amenorrhea    Anemia    Anxiety    panic attacks   Back pain    Bradycardia    Chest pain    Constipation    COVID 02/2020   Diabetes ([place])    Dysmenorrhea    Endometriosis    Gallbladder problem    GERD (gastroesophageal reflux disease)    occasional - diet controlled and tums prn   HLD (hyperlipidemia)    HTN (hypertension)    IBS (irritable bowel syndrome)    Infertility, female    Insulin resistance    Migraine without aura    Missed abortion    x 2 - both resolved without surgery   Obesity    PCOS (polycystic ovarian syndrome)    PCOS (polycystic ovarian syndrome)    PONV (postoperative nausea and vomiting)    pe rpatient ; scopalamine patch very helpful    Sleep  apnea    SVD (spontaneous vaginal delivery)    x 1   Syncope and collapse 06/21/2020   Urinary incontinence    Vitamin D deficiency     Past Surgical History:  Procedure Laterality Date   CHOLECYSTECTOMY     COLONOSCOPY  2017   polyps   CYSTOSCOPY N/A 10/29/2016   Procedure: CYSTOSCOPY;  Surgeon: Salvadore Dom, MD;  Location: [facility];  Service: Gynecology;  Laterality: N/A;   DILATATION & CURRETTAGE/HYSTEROSCOPY WITH RESECTOCOPE N/A 10/17/2012   Procedure: DILATATION & CURETTAGE/HYSTEROSCOPY WITH RESECTOCOPE; Possible Polypectomy, Possible Resectoscopic Myomectomy.;  Surgeon: Floyce Stakes. Pamala Hurry, MD;  Location: [facility];  Service: Gynecology;  Laterality: N/A;  1 hr.   DILATION AND CURETTAGE OF UTERUS     ENDOMETRIAL BIOPSY     ENDOVENOUS ABLATION SAPHENOUS VEIN W/ LASER Right 05/08/2018   endovenous laser ablation right  greater saphenous vein and stab phlebectomy > 20 incisions right leg by Deitra Mayo MD    ESOPHAGOGASTRODUODENOSCOPY ENDOSCOPY  2017   Normal   HYSTEROSCOPY WITH D & C N/A 12/01/2013   Procedure: IUD Removal;  Surgeon: Floyce Stakes. Pamala Hurry, MD;  Location: [facility];  Service: Gynecology;  Laterality: N/A;  90 min.   INTRAUTERINE DEVICE (IUD) INSERTION N/A 10/17/2012   Procedure: INTRAUTERINE DEVICE (IUD) INSERTION; Mirena;  Surgeon: Floyce Stakes. Pamala Hurry, MD;  Location: [facility];  Service: Gynecology;  Laterality: N/A;   LAPAROSCOPIC GASTRIC SLEEVE RESECTION N/A 02/19/2017   Procedure: LAPAROSCOPIC GASTRIC SLEEVE RESECTION, UPPER ENDOSCOPY;  Surgeon: Johnathan Hausen, MD;  Location: [facility];  Service: General;  Laterality: N/A;   LAPAROSCOPIC SALPINGO OOPHERECTOMY Left 10/29/2016   Procedure: LAPAROSCOPIC SALPINGO OOPHORECTOMY With anterior uterine biopsy;  Surgeon: Salvadore Dom, MD;  Location: [facility];  Service: Gynecology;  Laterality: Left;   LASIK  10/06/2018   LOOP RECORDER INSERTION N/A 06/22/2020   Procedure: LOOP RECORDER INSERTION;  Surgeon: Evans Lance, MD;  Location: [facility];  Service: Cardiovascular;  Laterality: N/A;   MOUTH SURGERY     wisdom teeth    Current Outpatient Medications  Medication Sig Dispense Refill   albuterol (VENTOLIN HFA) 108 (90 Base) MCG/ACT inhaler Inhale 2 puffs into the lungs every 6 (six) hours as needed for wheezing or shortness of breath. 18 g 0   APPLE CIDER VINEGAR PO Take 1 tablet by mouth daily.     Biotin w/ Vitamins C & E (HAIR/SKIN/NAILS PO) Take 1 tablet by mouth daily.      clonazePAM (KLONOPIN) 0.5 MG tablet Take 1 tablet (0.5 mg total) by mouth 2 (two) times daily as needed for anxiety. 20 tablet 1   cyclobenzaprine (FLEXERIL) 10 MG tablet Take 1 tablet (10 mg total) by mouth 2 (two) times daily as needed for muscle spasms. 30 tablet 0   dicyclomine (BENTYL) 20 MG tablet Take 1 tablet by mouth every 6  hours. 120 tablet 0    fluconazole (DIFLUCAN) 150 MG tablet Take 1 tablet (150 mg total) by mouth once a week. 4 tablet 2   levonorgestrel (MIRENA) 20 MCG/24HR IUD 1 each by Intrauterine route once.     loratadine (CLARITIN) 10 MG tablet Take 10 mg by mouth daily as needed for allergies.     misoprostol (CYTOTEC) 200 MCG tablet Place 2 tablets vaginal 6-12 hours prior to the procedure. 2 tablet 0   Multiple Vitamins-Minerals (BARIATRIC MULTIVITAMINS/IRON PO) Take 1 tablet by mouth in the morning and at bedtime.     nystatin ointment (MYCOSTATIN)  Apply topically 2 times daily. 30 g 6   ondansetron (ZOFRAN-ODT) 4 MG disintegrating tablet Dissolve 1 tablet by mouth every 8 hours as needed for nausea or vomiting. 20 tablet 0   Probiotic Product (PROBIOTIC ADVANCED PO) Take 1 capsule by mouth daily.      sertraline (ZOLOFT) 50 MG tablet Take 1 tablet by mouth daily. 30 tablet 3   tirzepatide (MOUNJARO) 5 MG/0.5ML Pen Inject 5 mg into the skin once a week. 6 mL 0   Vitamin D, Ergocalciferol, (DRISDOL) 1.25 MG (50000 UNIT) CAPS capsule TAKE 1 CAPSULE BY MOUTH EVERY 7 DAYS 12 capsule 0   oxyCODONE (OXY IR/ROXICODONE) 5 MG immediate release tablet Take 1 tablet by mouth every 4 hours as needed. (Patient not taking: Reported on 07/07/2021) 5 tablet 0   SUMAtriptan (IMITREX) 50 MG tablet TAKE 1 TABLET BY MOUTH AT ONSET OF MIGRAINE, MAY REPEAT IN 2 HOURS IF HEADACHE PERSISTS (Patient taking differently: Take 50 mg by mouth every 2 (two) hours as needed for migraine.) 10 tablet 0   No current facility-administered medications for this visit.     ALLERGIES: Iohexol, Iodinated contrast media, and Pyridium [phenazopyridine hcl]  Family History  Problem Relation Age of Onset   Heart disease Mother        MVP   Hypertension Mother    Diabetes Mother    Depression Mother    Obesity Mother    Diabetes Father    Thyroid disease Father    Hypertension Father    Parkinson's disease Father    Dementia Father    Hyperlipidemia Father     Heart disease Father    Sleep apnea Father    Obesity Father    Colon cancer Maternal Uncle    Breast cancer Paternal Aunt    Diabetes Maternal Grandmother    Heart disease Maternal Grandmother    Diabetes Maternal Grandfather    Heart disease Maternal Grandfather    Stroke Maternal Grandfather    Diabetes Paternal Grandmother    Stroke Paternal Grandfather    Diabetes Paternal Grandfather    Heart disease Paternal Grandfather    Alcohol abuse Paternal Grandfather     Social History   Socioeconomic History   Marital status: Married    Spouse name: Satya Bohall   Number of children: 1   Years of education: Not on file   Highest education level: Not on file  Occupational History   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  Other Topics Concern   Not on file  Social History Narrative   Lives alone, going through separation.   Social Determinants of Health   Financial Resource Strain: Not on file  Food Insecurity: Not on file  Transportation Needs: Not on file  Physical Activity: Not on file  Stress: Not on file  Social Connections: Not on file  Intimate Partner Violence: Not on file    ROS  PHYSICAL  EXAMINATION:    BP 120/82    LMP 06/22/2021     General appearance: alert, cooperative and appears stated age  Pelvic: External genitalia:  no lesions              Urethra:  normal appearing urethra with no masses, tenderness or lesions              Bartholins and Skenes: normal                 Vagina: normal appearing vagina with  normal color and discharge, no lesions              Cervix: no lesions  The risks of the mirena IUD were reviewed with the patient, including infection, abnormal bleeding and uterine perfortion. Consent was signed.  A speculum was placed in the vagina, the cervix was cleansed with hibiclens. The old IUD was removed with a ringed forceps.  A tenaculum was placed on the cervix, the uterus sounded to 8 cm. The cervix was dilated to a #5 hagar dilator  The mirena IUD was inserted without difficulty. The string were cut to 3 cm.    The patient tolerated the procedure well.   Chaperone was present for exam.  1. Encounter for IUD removal IUD removed  2. Encounter for IUD insertion New mirena IUD inserted F/U in one month

## 2021-07-04 NOTE — Progress Notes (Signed)
Oxycodone sent.

## 2021-07-06 LAB — ANEMIA PANEL
Ferritin: 65 ng/mL (ref 15–150)
Folate, Hemolysate: 507 ng/mL
Folate, RBC: 1190 ng/mL (ref >498)
Hematocrit: 42.6 % (ref 34.0–46.6)
Iron Saturation: 26 % (ref 15–55)
Iron: 69 ug/dL (ref 27–159)
Retic Ct Pct: 1.1 % (ref 0.6–2.6)
Total Iron Binding Capacity: 267 ug/dL (ref 250–450)
UIBC: 198 ug/dL (ref 131–425)
Vitamin B-12: 253 pg/mL (ref 232–1245)

## 2021-07-07 ENCOUNTER — Encounter: Payer: Self-pay | Admitting: Obstetrics and Gynecology

## 2021-07-07 ENCOUNTER — Other Ambulatory Visit: Payer: Self-pay

## 2021-07-07 ENCOUNTER — Ambulatory Visit (INDEPENDENT_AMBULATORY_CARE_PROVIDER_SITE_OTHER): Payer: No Typology Code available for payment source | Admitting: Obstetrics and Gynecology

## 2021-07-07 VITALS — BP 120/82

## 2021-07-07 DIAGNOSIS — Z30430 Encounter for insertion of intrauterine contraceptive device: Secondary | ICD-10-CM | POA: Diagnosis not present

## 2021-07-07 DIAGNOSIS — Z30432 Encounter for removal of intrauterine contraceptive device: Secondary | ICD-10-CM

## 2021-07-07 NOTE — Patient Instructions (Signed)

## 2021-07-10 ENCOUNTER — Encounter: Payer: Self-pay | Admitting: Obstetrics and Gynecology

## 2021-07-17 ENCOUNTER — Ambulatory Visit (INDEPENDENT_AMBULATORY_CARE_PROVIDER_SITE_OTHER): Payer: No Typology Code available for payment source

## 2021-07-17 DIAGNOSIS — R55 Syncope and collapse: Secondary | ICD-10-CM | POA: Diagnosis not present

## 2021-07-18 LAB — CUP PACEART REMOTE DEVICE CHECK
Date Time Interrogation Session: 20230312230548
Implantable Pulse Generator Implant Date: 20220216

## 2021-07-19 ENCOUNTER — Encounter (INDEPENDENT_AMBULATORY_CARE_PROVIDER_SITE_OTHER): Payer: Self-pay

## 2021-07-19 ENCOUNTER — Ambulatory Visit (INDEPENDENT_AMBULATORY_CARE_PROVIDER_SITE_OTHER): Payer: No Typology Code available for payment source | Admitting: Family Medicine

## 2021-07-24 ENCOUNTER — Encounter (INDEPENDENT_AMBULATORY_CARE_PROVIDER_SITE_OTHER): Payer: Self-pay | Admitting: Family Medicine

## 2021-07-24 ENCOUNTER — Ambulatory Visit (INDEPENDENT_AMBULATORY_CARE_PROVIDER_SITE_OTHER): Payer: No Typology Code available for payment source | Admitting: Family Medicine

## 2021-07-24 ENCOUNTER — Other Ambulatory Visit (HOSPITAL_COMMUNITY): Payer: Self-pay

## 2021-07-24 ENCOUNTER — Other Ambulatory Visit: Payer: Self-pay

## 2021-07-24 VITALS — BP 92/53 | HR 86 | Temp 98.1°F | Ht 68.0 in | Wt 187.0 lb

## 2021-07-24 DIAGNOSIS — F410 Panic disorder [episodic paroxysmal anxiety] without agoraphobia: Secondary | ICD-10-CM

## 2021-07-24 DIAGNOSIS — E559 Vitamin D deficiency, unspecified: Secondary | ICD-10-CM | POA: Diagnosis not present

## 2021-07-24 DIAGNOSIS — Z6828 Body mass index (BMI) 28.0-28.9, adult: Secondary | ICD-10-CM

## 2021-07-24 DIAGNOSIS — E669 Obesity, unspecified: Secondary | ICD-10-CM

## 2021-07-24 DIAGNOSIS — E538 Deficiency of other specified B group vitamins: Secondary | ICD-10-CM

## 2021-07-24 DIAGNOSIS — E1169 Type 2 diabetes mellitus with other specified complication: Secondary | ICD-10-CM

## 2021-07-24 DIAGNOSIS — E119 Type 2 diabetes mellitus without complications: Secondary | ICD-10-CM

## 2021-07-24 DIAGNOSIS — F430 Acute stress reaction: Secondary | ICD-10-CM | POA: Diagnosis not present

## 2021-07-24 DIAGNOSIS — Z7985 Long-term (current) use of injectable non-insulin antidiabetic drugs: Secondary | ICD-10-CM

## 2021-07-24 MED ORDER — VITAMIN D (ERGOCALCIFEROL) 1.25 MG (50000 UNIT) PO CAPS
ORAL_CAPSULE | ORAL | 0 refills | Status: DC
  Filled 2021-07-24: qty 12, 84d supply, fill #0

## 2021-07-24 MED ORDER — ONDANSETRON 4 MG PO TBDP
4.0000 mg | ORAL_TABLET | Freq: Three times a day (TID) | ORAL | 0 refills | Status: DC | PRN
  Filled 2021-07-24: qty 20, 7d supply, fill #0

## 2021-07-24 MED ORDER — CLONAZEPAM 0.5 MG PO TABS
0.5000 mg | ORAL_TABLET | Freq: Two times a day (BID) | ORAL | 1 refills | Status: DC | PRN
  Filled 2021-07-24: qty 20, 10d supply, fill #0

## 2021-07-24 MED ORDER — TIRZEPATIDE 2.5 MG/0.5ML ~~LOC~~ SOAJ
2.5000 mg | SUBCUTANEOUS | 0 refills | Status: DC
  Filled 2021-07-24: qty 2, 28d supply, fill #0

## 2021-07-25 ENCOUNTER — Other Ambulatory Visit (HOSPITAL_COMMUNITY): Payer: Self-pay

## 2021-07-25 MED ORDER — CYANOCOBALAMIN 1000 MCG/ML IJ SOLN
INTRAMUSCULAR | 0 refills | Status: DC
  Filled 2021-07-25: qty 10, 90d supply, fill #0

## 2021-07-25 MED ORDER — "SYRINGE/NEEDLE (DISP) 25G X 1"" 3 ML MISC"
0 refills | Status: DC
  Filled 2021-07-25: qty 10, 30d supply, fill #0

## 2021-07-31 NOTE — H&P (View-Only) (Signed)
? ?  [name], female    DOB: May 19, 1980, 41 y.o.   MRN: [id number] ? ?Chief Complaint  ?Patient presents with  ? Pre-op Exam  ? ? ?  ICD-10-CM   ?1. Chronic bilateral low back pain, unspecified whether sciatica present  M54.50   ? G89.29   ?  ? ? ? ?History of Present Illness: ?[name] [name] is a 41 y.o.  female  with a history of chronic back discomfort and panniculitis.  She presents for preoperative evaluation for upcoming procedure, abdominoplasty with liposuction of mons pubis, scheduled for 08/24/2021 with Dr. Marla Roe. ? ?The patient has not had problems with anesthesia aside from postoperative nausea and vomiting which was well controlled with scopolamine patch.  Patient has had reassuring cardiac work-up, but likely will still obtain clearance from her cardiologist given that she has a loop recorder in place.  She denies history of cancer or personal/family history of blood clots/clotting disorder.  Denies history of MI/CVA.  She maintains that she quit smoking tobacco in 2018, but unfortunately did have a very brief relapse 10 days ago where she had a couple of cigarettes with her friends.  She is remorseful and states that there will be no further nicotine-containing products moving forward.  Denies recent hospitalization.  She has the Mirena IUD.  She has already purchased a bed wedge sat and is quite prepared for her upcoming surgery.  She reportedly was fortunately able to have the mons plasty and upper abdominal plication approved through her insurance in addition to the panniculectomy given her extreme weight loss in conjunction with her symptoms. ? ?Summary of Previous Visit: Patient was seen on 05/16/2021 by Dr. Elisabeth Cara.  At that time, she expressed interest in surgical intervention for her large pannus via panniculectomy.  She has had previous cholecystectomy as well as OB/GYN surgery.  She complained of chronic back and neck discomfort in addition to infra pannus rashes.   Discussed panniculectomy with mons lift.  Also discussed providing abdominoplasty quote.  Evidently there was a decision for her to stay overnight for observation. ? ?Job: Physically demanding job, would like 6 weeks off which is entirely reasonable.  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 ? ?PMH Significant for: Panniculitis, anxiety, type II DM, previous tobacco use disorder, laparoscopic gastric sleeve 2018, cholecystectomy, migraine disorder, syncope.  Per cardiology notes 03/2021, reassuring CT coronary completed, only minimal nonobstructive CAD.  10-year ASCVD 0.9%.  Episodes of near syncope do not correlate with concerning findings on loop recorder.  Etiology unclear. ? ? ?Past Medical History: ?Allergies: ?Allergies  ?Allergen Reactions  ? Iohexol Hives  ?   Code: HIVES, Desc: *NEED 13 HR PREDISONE AND BENADRYL  BE FOR IV CONTRAST GIVEN* /DR OLSON PT DEVELOPED HIVES AND ROOF OF MOUTH BEGIN "ITCHING"", Onset Date: [id number] ?  ? Pyridium [Phenazopyridine Hcl] Other (See Comments)  ?  vaginitis symptoms when on it  ? ? ?Current Medications: ? ?Current Outpatient Medications:  ?  albuterol (VENTOLIN HFA) 108 (90 Base) MCG/ACT inhaler, Inhale 2 puffs into the lungs every 6 (six) hours as needed for wheezing or shortness of breath., Disp: 18 g, Rfl: 0 ?  APPLE CIDER VINEGAR PO, Take 1 tablet by mouth daily., Disp: , Rfl:  ?  Biotin w/ Vitamins C & E (HAIR/SKIN/NAILS PO), Take 1 tablet by mouth daily. , Disp: , Rfl:  ?  clonazePAM (KLONOPIN) 0.5 MG tablet, Take 1 tablet by mouth 2  times daily as  needed for anxiety., Disp: 20 tablet, Rfl: 1 ?  cyanocobalamin (,VITAMIN B-12,) 1000 MCG/ML injection, Inject 1 ml daily x 3 days, then weekly x 3 weeks, then monthly, Disp: 10 mL, Rfl: 0 ?  cyclobenzaprine (FLEXERIL) 10 MG tablet, Take 1 tablet (10 mg total) by mouth 2 (two) times daily as needed for muscle spasms., Disp: 30 tablet, Rfl: 0 ?  fluconazole (DIFLUCAN) 150 MG tablet, Take 1 tablet (150 mg total)  by mouth once a week., Disp: 4 tablet, Rfl: 2 ?  levonorgestrel (MIRENA) 20 MCG/24HR IUD, 1 each by Intrauterine route once., Disp: , Rfl:  ?  loratadine (CLARITIN) 10 MG tablet, Take 10 mg by mouth daily as needed for allergies., Disp: , Rfl:  ?  Multiple Vitamins-Minerals (BARIATRIC MULTIVITAMINS/IRON PO), Take 1 tablet by mouth in the morning and at bedtime., Disp: , Rfl:  ?  nystatin ointment (MYCOSTATIN), Apply topically 2 times daily., Disp: 30 g, Rfl: 6 ?  ondansetron (ZOFRAN-ODT) 4 MG disintegrating tablet, Dissolve 1 tablet by mouth every 8 hours as needed for nausea or vomiting., Disp: 20 tablet, Rfl: 0 ?  Probiotic Product (PROBIOTIC ADVANCED PO), Take 1 capsule by mouth daily. , Disp: , Rfl:  ?  sertraline (ZOLOFT) 50 MG tablet, Take 1 tablet by mouth daily., Disp: 30 tablet, Rfl: 3 ?  SYRINGE-NEEDLE, DISP, 3 ML 25G X 1" 3 ML MISC, Use to administer b12 injections, Disp: 10 each, Rfl: 0 ?  tirzepatide (MOUNJARO) 2.5 MG/0.5ML Pen, Inject 2.5 mg into the skin once a week., Disp: 2 mL, Rfl: 0 ?  tirzepatide (MOUNJARO) 5 MG/0.5ML Pen, Inject 5 mg into the skin once a week., Disp: 6 mL, Rfl: 0 ?  Vitamin D, Ergocalciferol, (DRISDOL) 1.25 MG (50000 UNIT) CAPS capsule, TAKE 1 CAPSULE BY MOUTH EVERY 7 DAYS, Disp: 12 capsule, Rfl: 0 ?  dicyclomine (BENTYL) 20 MG tablet, Take 1 tablet by mouth every 6  hours., Disp: 120 tablet, Rfl: 0 ?  SUMAtriptan (IMITREX) 50 MG tablet, TAKE 1 TABLET BY MOUTH AT ONSET OF MIGRAINE, MAY REPEAT IN 2 HOURS IF HEADACHE PERSISTS (Patient taking differently: Take 50 mg by mouth every 2 (two) hours as needed for migraine.), Disp: 10 tablet, Rfl: 0 ? ?Past Medical Problems: ?Past Medical History:  ?Diagnosis Date  ? Abnormal uterine bleeding   ? Amenorrhea   ? Anemia   ? Anxiety   ? panic attacks  ? Back pain   ? Bradycardia   ? Chest pain   ? Constipation   ? COVID 02/2020  ? Diabetes ([place])   ? Dysmenorrhea   ? Endometriosis   ? Gallbladder problem   ? GERD (gastroesophageal reflux  disease)   ? occasional - diet controlled and tums prn  ? HLD (hyperlipidemia)   ? HTN (hypertension)   ? IBS (irritable bowel syndrome)   ? Infertility, female   ? Insulin resistance   ? Migraine without aura   ? Missed abortion   ? x 2 - both resolved without surgery  ? Obesity   ? PCOS (polycystic ovarian syndrome)   ? PCOS (polycystic ovarian syndrome)   ? PONV (postoperative nausea and vomiting)   ? pe rpatient ; scopalamine patch very helpful   ? Sleep apnea   ? SVD (spontaneous vaginal delivery)   ? x 1  ? Syncope and collapse 06/21/2020  ? Urinary incontinence   ? Vitamin D deficiency   ? ? ?Past Surgical History: ?Past Surgical History:  ?Procedure Laterality Date  ?  CHOLECYSTECTOMY    ? COLONOSCOPY  2017  ? polyps  ? CYSTOSCOPY N/A 10/29/2016  ? Procedure: CYSTOSCOPY;  Surgeon: Salvadore Dom, MD;  Location: [facility];  Service: Gynecology;  Laterality: N/A;  ? DILATATION & CURRETTAGE/HYSTEROSCOPY WITH RESECTOCOPE N/A 10/17/2012  ? Procedure: DILATATION & CURETTAGE/HYSTEROSCOPY WITH RESECTOCOPE; Possible Polypectomy, Possible Resectoscopic Myomectomy.;  Surgeon: Floyce Stakes. Pamala Hurry, MD;  Location: [facility];  Service: Gynecology;  Laterality: N/A;  1 hr.  ? DILATION AND CURETTAGE OF UTERUS    ? ENDOMETRIAL BIOPSY    ? ENDOVENOUS ABLATION SAPHENOUS VEIN W/ LASER Right 05/08/2018  ? endovenous laser ablation right greater saphenous vein and stab phlebectomy > 20 incisions right leg by Deitra Mayo MD   ? ESOPHAGOGASTRODUODENOSCOPY ENDOSCOPY  2017  ? Normal  ? HYSTEROSCOPY WITH D & C N/A 12/01/2013  ? Procedure: IUD Removal;  Surgeon: Floyce Stakes. Pamala Hurry, MD;  Location: [facility];  Service: Gynecology;  Laterality: N/A;  90 min.  ? INTRAUTERINE DEVICE (IUD) INSERTION N/A 10/17/2012  ? Procedure: INTRAUTERINE DEVICE (IUD) INSERTION; Mirena;  Surgeon: Floyce Stakes. Pamala Hurry, MD;  Location: [facility];  Service: Gynecology;  Laterality: N/A;  ? LAPAROSCOPIC GASTRIC SLEEVE RESECTION N/A 02/19/2017  ? Procedure: LAPAROSCOPIC  GASTRIC SLEEVE RESECTION, UPPER ENDOSCOPY;  Surgeon: Johnathan Hausen, MD;  Location: [facility];  Service: General;  Laterality: N/A;  ? LAPAROSCOPIC SALPINGO OOPHERECTOMY Left 10/29/2016  ? Procedure: LAPAROSCO

## 2021-07-31 NOTE — Progress Notes (Signed)
[facility] Summary Report / Loop Recorder 

## 2021-07-31 NOTE — Progress Notes (Signed)
? ?  [name], female    DOB: 06-22-1980, 41 y.o.   MRN: [id number] ? ?Chief Complaint  ?Patient presents with  ? Pre-op Exam  ? ? ?  ICD-10-CM   ?1. Chronic bilateral low back pain, unspecified whether sciatica present  M54.50   ? G89.29   ?  ? ? ? ?History of Present Illness: ?[name] [name] is a 41 y.o.  female  with a history of chronic back discomfort and panniculitis.  She presents for preoperative evaluation for upcoming procedure, abdominoplasty with liposuction of mons pubis, scheduled for 08/24/2021 with Dr. Marla Roe. ? ?The patient has not had problems with anesthesia aside from postoperative nausea and vomiting which was well controlled with scopolamine patch.  Patient has had reassuring cardiac work-up, but likely will still obtain clearance from her cardiologist given that she has a loop recorder in place.  She denies history of cancer or personal/family history of blood clots/clotting disorder.  Denies history of MI/CVA.  She maintains that she quit smoking tobacco in 2018, but unfortunately did have a very brief relapse 10 days ago where she had a couple of cigarettes with her friends.  She is remorseful and states that there will be no further nicotine-containing products moving forward.  Denies recent hospitalization.  She has the Mirena IUD.  She has already purchased a bed wedge sat and is quite prepared for her upcoming surgery.  She reportedly was fortunately able to have the mons plasty and upper abdominal plication approved through her insurance in addition to the panniculectomy given her extreme weight loss in conjunction with her symptoms. ? ?Summary of Previous Visit: Patient was seen on 05/16/2021 by Dr. Elisabeth Cara.  At that time, she expressed interest in surgical intervention for her large pannus via panniculectomy.  She has had previous cholecystectomy as well as OB/GYN surgery.  She complained of chronic back and neck discomfort in addition to infra pannus rashes.   Discussed panniculectomy with mons lift.  Also discussed providing abdominoplasty quote.  Evidently there was a decision for her to stay overnight for observation. ? ?Job: Physically demanding job, would like 6 weeks off which is entirely reasonable.  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 ? ?PMH Significant for: Panniculitis, anxiety, type II DM, previous tobacco use disorder, laparoscopic gastric sleeve 2018, cholecystectomy, migraine disorder, syncope.  Per cardiology notes 03/2021, reassuring CT coronary completed, only minimal nonobstructive CAD.  10-year ASCVD 0.9%.  Episodes of near syncope do not correlate with concerning findings on loop recorder.  Etiology unclear. ? ? ?Past Medical History: ?Allergies: ?Allergies  ?Allergen Reactions  ? Iohexol Hives  ?   Code: HIVES, Desc: *NEED 13 HR PREDISONE AND BENADRYL  BE FOR IV CONTRAST GIVEN* /DR OLSON PT DEVELOPED HIVES AND ROOF OF MOUTH BEGIN "ITCHING"", Onset Date: [id number] ?  ? Pyridium [Phenazopyridine Hcl] Other (See Comments)  ?  vaginitis symptoms when on it  ? ? ?Current Medications: ? ?Current Outpatient Medications:  ?  albuterol (VENTOLIN HFA) 108 (90 Base) MCG/ACT inhaler, Inhale 2 puffs into the lungs every 6 (six) hours as needed for wheezing or shortness of breath., Disp: 18 g, Rfl: 0 ?  APPLE CIDER VINEGAR PO, Take 1 tablet by mouth daily., Disp: , Rfl:  ?  Biotin w/ Vitamins C & E (HAIR/SKIN/NAILS PO), Take 1 tablet by mouth daily. , Disp: , Rfl:  ?  clonazePAM (KLONOPIN) 0.5 MG tablet, Take 1 tablet by mouth 2  times daily as  needed for anxiety., Disp: 20 tablet, Rfl: 1 ?  cyanocobalamin (,VITAMIN B-12,) 1000 MCG/ML injection, Inject 1 ml daily x 3 days, then weekly x 3 weeks, then monthly, Disp: 10 mL, Rfl: 0 ?  cyclobenzaprine (FLEXERIL) 10 MG tablet, Take 1 tablet (10 mg total) by mouth 2 (two) times daily as needed for muscle spasms., Disp: 30 tablet, Rfl: 0 ?  fluconazole (DIFLUCAN) 150 MG tablet, Take 1 tablet (150 mg total)  by mouth once a week., Disp: 4 tablet, Rfl: 2 ?  levonorgestrel (MIRENA) 20 MCG/24HR IUD, 1 each by Intrauterine route once., Disp: , Rfl:  ?  loratadine (CLARITIN) 10 MG tablet, Take 10 mg by mouth daily as needed for allergies., Disp: , Rfl:  ?  Multiple Vitamins-Minerals (BARIATRIC MULTIVITAMINS/IRON PO), Take 1 tablet by mouth in the morning and at bedtime., Disp: , Rfl:  ?  nystatin ointment (MYCOSTATIN), Apply topically 2 times daily., Disp: 30 g, Rfl: 6 ?  ondansetron (ZOFRAN-ODT) 4 MG disintegrating tablet, Dissolve 1 tablet by mouth every 8 hours as needed for nausea or vomiting., Disp: 20 tablet, Rfl: 0 ?  Probiotic Product (PROBIOTIC ADVANCED PO), Take 1 capsule by mouth daily. , Disp: , Rfl:  ?  sertraline (ZOLOFT) 50 MG tablet, Take 1 tablet by mouth daily., Disp: 30 tablet, Rfl: 3 ?  SYRINGE-NEEDLE, DISP, 3 ML 25G X 1" 3 ML MISC, Use to administer b12 injections, Disp: 10 each, Rfl: 0 ?  tirzepatide (MOUNJARO) 2.5 MG/0.5ML Pen, Inject 2.5 mg into the skin once a week., Disp: 2 mL, Rfl: 0 ?  tirzepatide (MOUNJARO) 5 MG/0.5ML Pen, Inject 5 mg into the skin once a week., Disp: 6 mL, Rfl: 0 ?  Vitamin D, Ergocalciferol, (DRISDOL) 1.25 MG (50000 UNIT) CAPS capsule, TAKE 1 CAPSULE BY MOUTH EVERY 7 DAYS, Disp: 12 capsule, Rfl: 0 ?  dicyclomine (BENTYL) 20 MG tablet, Take 1 tablet by mouth every 6  hours., Disp: 120 tablet, Rfl: 0 ?  SUMAtriptan (IMITREX) 50 MG tablet, TAKE 1 TABLET BY MOUTH AT ONSET OF MIGRAINE, MAY REPEAT IN 2 HOURS IF HEADACHE PERSISTS (Patient taking differently: Take 50 mg by mouth every 2 (two) hours as needed for migraine.), Disp: 10 tablet, Rfl: 0 ? ?Past Medical Problems: ?Past Medical History:  ?Diagnosis Date  ? Abnormal uterine bleeding   ? Amenorrhea   ? Anemia   ? Anxiety   ? panic attacks  ? Back pain   ? Bradycardia   ? Chest pain   ? Constipation   ? COVID 02/2020  ? Diabetes ([place])   ? Dysmenorrhea   ? Endometriosis   ? Gallbladder problem   ? GERD (gastroesophageal reflux  disease)   ? occasional - diet controlled and tums prn  ? HLD (hyperlipidemia)   ? HTN (hypertension)   ? IBS (irritable bowel syndrome)   ? Infertility, female   ? Insulin resistance   ? Migraine without aura   ? Missed abortion   ? x 2 - both resolved without surgery  ? Obesity   ? PCOS (polycystic ovarian syndrome)   ? PCOS (polycystic ovarian syndrome)   ? PONV (postoperative nausea and vomiting)   ? pe rpatient ; scopalamine patch very helpful   ? Sleep apnea   ? SVD (spontaneous vaginal delivery)   ? x 1  ? Syncope and collapse 06/21/2020  ? Urinary incontinence   ? Vitamin D deficiency   ? ? ?Past Surgical History: ?Past Surgical History:  ?Procedure Laterality Date  ?  CHOLECYSTECTOMY    ? COLONOSCOPY  2017  ? polyps  ? CYSTOSCOPY N/A 10/29/2016  ? Procedure: CYSTOSCOPY;  Surgeon: Salvadore Dom, MD;  Location: [facility];  Service: Gynecology;  Laterality: N/A;  ? DILATATION & CURRETTAGE/HYSTEROSCOPY WITH RESECTOCOPE N/A 10/17/2012  ? Procedure: DILATATION & CURETTAGE/HYSTEROSCOPY WITH RESECTOCOPE; Possible Polypectomy, Possible Resectoscopic Myomectomy.;  Surgeon: Floyce Stakes. Pamala Hurry, MD;  Location: [facility];  Service: Gynecology;  Laterality: N/A;  1 hr.  ? DILATION AND CURETTAGE OF UTERUS    ? ENDOMETRIAL BIOPSY    ? ENDOVENOUS ABLATION SAPHENOUS VEIN W/ LASER Right 05/08/2018  ? endovenous laser ablation right greater saphenous vein and stab phlebectomy > 20 incisions right leg by Deitra Mayo MD   ? ESOPHAGOGASTRODUODENOSCOPY ENDOSCOPY  2017  ? Normal  ? HYSTEROSCOPY WITH D & C N/A 12/01/2013  ? Procedure: IUD Removal;  Surgeon: Floyce Stakes. Pamala Hurry, MD;  Location: [facility];  Service: Gynecology;  Laterality: N/A;  90 min.  ? INTRAUTERINE DEVICE (IUD) INSERTION N/A 10/17/2012  ? Procedure: INTRAUTERINE DEVICE (IUD) INSERTION; Mirena;  Surgeon: Floyce Stakes. Pamala Hurry, MD;  Location: [facility];  Service: Gynecology;  Laterality: N/A;  ? LAPAROSCOPIC GASTRIC SLEEVE RESECTION N/A 02/19/2017  ? Procedure: LAPAROSCOPIC  GASTRIC SLEEVE RESECTION, UPPER ENDOSCOPY;  Surgeon: Johnathan Hausen, MD;  Location: [facility];  Service: General;  Laterality: N/A;  ? LAPAROSCOPIC SALPINGO OOPHERECTOMY Left 10/29/2016  ? Procedure: LAPAROSCO

## 2021-08-01 NOTE — Progress Notes (Signed)
Chief Complaint:   OBESITY [name] [name] is here to discuss her progress with her obesity treatment plan along with follow-up of her obesity related diagnoses. See Medical Weight Management Flowsheet for complete bioelectrical impedance results.  Today's visit was #: Category 1 Plan for 80% of the time.  Starting weight: 243 lbs Starting date: 06/18/2019  Weight change since last visit: 7 lbs Total lbs lost to date: 56 lbs Total weight loss percentage to date: -23.05%  Nutrition Plan: Category 1 Plan for 80% of the time.  Activity: Cardio/resistance for 75 minutes 3 times per week. Anti-obesity medications: Mounjaro 2.5 mg subcutaneously weekly. Reported side effects: None.  Interim History: [name] [name] is at her goal weight.  Her surgery has been approved.  Assessment/Plan:   1. Type 2 diabetes mellitus without complication, without long-term current use of insulin (HCC) Diabetes Mellitus: At goal. Medication: Mounjaro 2.5 mg subcutaneously weekly. Issues reviewed: blood sugar goals, complications of diabetes mellitus, hypoglycemia prevention and treatment, exercise, and nutrition.  Plan: Continue Mounjaro 2.5 mg subcutaneously weekly and Zofran 4 mg as needed for nausea. The patient will continue to focus on protein-rich, low simple carbohydrate foods. We reviewed the importance of hydration, regular exercise for stress reduction, and restorative sleep.   Lab Results  Component Value Date   HGBA1C 5.2 06/28/2021   HGBA1C 5.7 (H) 09/23/2020   HGBA1C 5.8 (H) 08/18/2020   Lab Results  Component Value Date   MICROALBUR 10 09/23/2020   LDLCALC 94 06/28/2021   CREATININE 0.92 06/28/2021   - Refill ondansetron (ZOFRAN-ODT) 4 MG disintegrating tablet; Dissolve 1 tablet by mouth every 8 hours as needed for nausea or vomiting.  Dispense: 20 tablet; Refill: 0 - Refill tirzepatide (MOUNJARO) 2.5 MG/0.5ML Pen; Inject 2.5 mg into the skin once a week.  Dispense: 2 mL; Refill: 0  2. B12  deficiency [name] [name] is not currently on B12 supplementation.  Plan:  She will start B12 injections once daily for 3 days, then once weekly for 3 weeks, then monthly.  Supplies provided.  - Start cyanocobalamin (,VITAMIN B-12,) 1000 MCG/ML injection; Inject 1 ml daily x 3 days, then weekly x 3 weeks, then monthly  Dispense: 10 mL; Refill: 0 - SYRINGE-NEEDLE, DISP, 3 ML 25G X 1" 3 ML MISC; Use to administer b12 injections  Dispense: 10 each; Refill: 0  3. Vitamin D deficiency At goal. She is taking vitamin D 50,000 IU weekly.  Plan: Continue to take prescription Vitamin D '@50'$ ,000 IU every week as prescribed.  Follow-up for routine testing of Vitamin D, at least 2-3 times per year to avoid over-replacement.  Lab Results  Component Value Date   VD25OH 83.7 06/28/2021   VD25OH 48.6 08/18/2020   VD25OH 43.7 10/14/2019   - Refill Vitamin D, Ergocalciferol, (DRISDOL) 1.25 MG (50000 UNIT) CAPS capsule; TAKE 1 CAPSULE BY MOUTH EVERY 7 DAYS  Dispense: 12 capsule; Refill: 0  4. Panic attack as reaction to stress Improving.  Plan:  Will refill Klonopin 0.5 mg today, as per below.  - Refill clonazePAM (KLONOPIN) 0.5 MG tablet; Take 1 tablet by mouth 2  times daily as needed for anxiety.  Dispense: 20 tablet; Refill: 1  5. Obesity, current BMI 28.4  Course: [name] [name] is currently in the action stage of change. As such, her goal is to continue with weight loss efforts.   Nutrition goals: She has agreed to Increase calories to maintenance..   Exercise goals:  As is.  Behavioral modification strategies: increasing lean protein intake,  decreasing simple carbohydrates, increasing vegetables, and increasing water intake.  [name] [name] has agreed to follow-up with our clinic in 4 weeks. She was informed of the importance of frequent follow-up visits to maximize her success with intensive lifestyle modifications for her multiple health conditions.   Objective:   Blood pressure (!) 92/53, pulse 86,  temperature 98.1 F (36.7 C), temperature source Oral, height '5\' 8"'$  (1.727 m), weight 187 lb (84.8 kg), SpO2 100 %. Body mass index is 28.43 kg/m.  General: Cooperative, alert, well developed, in no acute distress. HEENT: Conjunctivae and lids unremarkable. Cardiovascular: Regular rhythm.  Lungs: Normal work of breathing. Neurologic: No focal deficits.   Lab Results  Component Value Date   CREATININE 0.92 06/28/2021   BUN 17 06/28/2021   NA 141 06/28/2021   K 4.3 06/28/2021   CL 102 06/28/2021   CO2 24 06/28/2021   Lab Results  Component Value Date   ALT 11 06/28/2021   AST 13 06/28/2021   ALKPHOS 59 06/28/2021   BILITOT 0.5 06/28/2021   Lab Results  Component Value Date   HGBA1C 5.2 06/28/2021   HGBA1C 5.7 (H) 09/23/2020   HGBA1C 5.8 (H) 08/18/2020   HGBA1C 5.6 10/14/2019   HGBA1C 5.7 (H) 06/18/2019   Lab Results  Component Value Date   INSULIN 11.0 08/18/2020   INSULIN 11.9 10/14/2019   INSULIN 10.1 06/18/2019   Lab Results  Component Value Date   TSH 1.550 06/28/2021   Lab Results  Component Value Date   CHOL 153 06/28/2021   HDL 47 06/28/2021   LDLCALC 94 06/28/2021   LDLDIRECT 141 (H) 09/23/2020   TRIG 59 06/28/2021   CHOLHDL 3.3 06/28/2021   Lab Results  Component Value Date   VD25OH 83.7 06/28/2021   VD25OH 48.6 08/18/2020   VD25OH 43.7 10/14/2019   Lab Results  Component Value Date   WBC 8.0 06/28/2021   HGB 14.0 06/28/2021   HCT 42.6 07/05/2021   MCV 89 06/28/2021   PLT 255 06/28/2021   Lab Results  Component Value Date   IRON 69 07/05/2021   TIBC 267 07/05/2021   FERRITIN 65 07/05/2021   Attestation Statements:   Reviewed by clinician on day of visit: allergies, medications, problem list, medical history, surgical history, family history, social history, and previous encounter notes.  I, Water quality scientist, CMA, am acting as transcriptionist for Briscoe Deutscher, DO  I have reviewed the above documentation for accuracy and completeness,  and I agree with the above. -  Briscoe Deutscher, DO, MS, FAAFP, DABOM - Family and Bariatric Medicine.

## 2021-08-03 ENCOUNTER — Other Ambulatory Visit (HOSPITAL_COMMUNITY): Payer: Self-pay

## 2021-08-03 ENCOUNTER — Encounter: Payer: Self-pay | Admitting: Physician Assistant

## 2021-08-03 ENCOUNTER — Ambulatory Visit (INDEPENDENT_AMBULATORY_CARE_PROVIDER_SITE_OTHER): Payer: No Typology Code available for payment source | Admitting: Physician Assistant

## 2021-08-03 ENCOUNTER — Telehealth: Payer: Self-pay

## 2021-08-03 ENCOUNTER — Other Ambulatory Visit: Payer: Self-pay

## 2021-08-03 VITALS — BP 112/67 | HR 60 | Ht 68.0 in | Wt 187.0 lb

## 2021-08-03 DIAGNOSIS — G8929 Other chronic pain: Secondary | ICD-10-CM

## 2021-08-03 DIAGNOSIS — M545 Low back pain, unspecified: Secondary | ICD-10-CM

## 2021-08-03 MED ORDER — OXYCODONE HCL 5 MG PO TABS
5.0000 mg | ORAL_TABLET | Freq: Four times a day (QID) | ORAL | 0 refills | Status: AC | PRN
  Filled 2021-08-03: qty 20, 5d supply, fill #0

## 2021-08-03 MED ORDER — ONDANSETRON 4 MG PO TBDP
4.0000 mg | ORAL_TABLET | Freq: Three times a day (TID) | ORAL | 0 refills | Status: DC | PRN
  Filled 2021-08-03: qty 20, 7d supply, fill #0

## 2021-08-03 MED ORDER — CEPHALEXIN 500 MG PO CAPS
500.0000 mg | ORAL_CAPSULE | Freq: Four times a day (QID) | ORAL | 0 refills | Status: AC
  Filled 2021-08-03: qty 12, 3d supply, fill #0

## 2021-08-03 NOTE — Telephone Encounter (Signed)
Faxed surgical clearance to Dr. Buford Dresser for abdominoplasty  with liposuction of the mons pubis. SX: 08/24/2021 ?

## 2021-08-04 ENCOUNTER — Telehealth (HOSPITAL_BASED_OUTPATIENT_CLINIC_OR_DEPARTMENT_OTHER): Payer: Self-pay

## 2021-08-04 ENCOUNTER — Telehealth: Payer: Self-pay | Admitting: *Deleted

## 2021-08-04 NOTE — Telephone Encounter (Signed)
? ?  Pre-operative Risk Assessment  ?  ?Patient Name: [name] [name] [facility]  ?DOB: [birth year] ?MRN: [id number]  ? ?  ? ?Request for Surgical Clearance   ? ?Procedure:   Abdominoplasty with liposuction of mons pubis ? ?Date of Surgery:  Clearance 08/24/21                              ?   ?Surgeon:  Dr. Lyndee Leo Dillingham ?Surgeon's Group or Practice Name:  [facility]  ?Phone number:  [phone] ?Fax number:  [phone] Attn: Sallyanne Kuster, [place] ?  ?Type of Clearance Requested:   ?- Medical  ?  ?Type of Anesthesia:  General  ?  ?Additional requests/questions:   ? ?Signed, ?Meryl Crutch   ?08/04/2021, 10:38 AM   ?

## 2021-08-04 NOTE — Telephone Encounter (Signed)
Pt agreeable to plan of care for tele pre op appt 08/10/21 9 am. Med rec and consent are done.  ? ?  ?Patient Consent for Virtual Visit  ? ? ?   ? ?[name] [name] has provided verbal consent on 08/04/2021 for a virtual visit (video or telephone). ? ? ?CONSENT FOR VIRTUAL VISIT FOR:  [name] [name]  ?By participating in this virtual visit I agree to the following: ? ?I hereby voluntarily request, consent and authorize [facility] and its employed or contracted physicians, physician assistants, nurse practitioners or other licensed health care professionals (the Practitioner), to provide me with telemedicine health care services (the ?Services") as deemed necessary by the treating Practitioner. I acknowledge and consent to receive the Services by the Practitioner via telemedicine. I understand that the telemedicine visit will involve communicating with the Practitioner through live audiovisual communication technology and the disclosure of certain medical information by electronic transmission. I acknowledge that I have been given the opportunity to request an in-person assessment or other available alternative prior to the telemedicine visit and am voluntarily participating in the telemedicine visit. ? ?I understand that I have the right to withhold or withdraw my consent to the use of telemedicine in the course of my care at any time, without affecting my right to future care or treatment, and that the Practitioner or I may terminate the telemedicine visit at any time. I understand that I have the right to inspect all information obtained and/or recorded in the course of the telemedicine visit and may receive copies of available information for a reasonable fee.  I understand that some of the potential risks of receiving the Services via telemedicine include:  ?Delay or interruption in medical evaluation due to technological equipment failure or disruption; ?Information transmitted may not be sufficient (e.g. poor  resolution of images) to allow for appropriate medical decision making by the Practitioner; and/or  ?In rare instances, security protocols could fail, causing a breach of personal health information. ? ?Furthermore, I acknowledge that it is my responsibility to provide information about my medical history, conditions and care that is complete and accurate to the best of my ability. I acknowledge that Practitioner's advice, recommendations, and/or decision may be based on factors not within their control, such as incomplete or inaccurate data provided by me or distortions of diagnostic images or specimens that may result from electronic transmissions. I understand that the practice of medicine is not an exact science and that Practitioner makes no warranties or guarantees regarding treatment outcomes. I acknowledge that a copy of this consent can be made available to me via my patient portal ([facility]), or I can request a printed copy by calling the office of [facility].   ? ?I understand that my insurance will be billed for this visit.  ? ?I have read or had this consent read to me. ?I understand the contents of this consent, which adequately explains the benefits and risks of the Services being provided via telemedicine.  ?I have been provided ample opportunity to ask questions regarding this consent and the Services and have had my questions answered to my satisfaction. ?I give my informed consent for the services to be provided through the use of telemedicine in my medical care ? ? ? ?

## 2021-08-04 NOTE — Telephone Encounter (Signed)
Preoperative team, please contact this patient and set up a phone call appointment for further cardiac evaluation.  Thank you for your help. ? ?[name] [name]. [name] Marti NP-C ? ?  ?08/04/2021, 11:16 AM ?[place] ?[address] ?Office [phone] Fax [phone] ? ?

## 2021-08-04 NOTE — Telephone Encounter (Signed)
Pt agreeable to plan of care for tele pre op appt 08/10/21 9 am. Med rec and consent are done ?

## 2021-08-09 ENCOUNTER — Ambulatory Visit (INDEPENDENT_AMBULATORY_CARE_PROVIDER_SITE_OTHER): Payer: No Typology Code available for payment source | Admitting: Family Medicine

## 2021-08-09 ENCOUNTER — Other Ambulatory Visit (HOSPITAL_COMMUNITY): Payer: Self-pay

## 2021-08-09 ENCOUNTER — Ambulatory Visit: Payer: No Typology Code available for payment source | Admitting: Obstetrics and Gynecology

## 2021-08-09 ENCOUNTER — Encounter (INDEPENDENT_AMBULATORY_CARE_PROVIDER_SITE_OTHER): Payer: Self-pay | Admitting: Family Medicine

## 2021-08-09 VITALS — BP 95/60 | HR 70 | Temp 97.8°F | Ht 68.0 in | Wt 186.0 lb

## 2021-08-09 DIAGNOSIS — G43909 Migraine, unspecified, not intractable, without status migrainosus: Secondary | ICD-10-CM

## 2021-08-09 DIAGNOSIS — G43009 Migraine without aura, not intractable, without status migrainosus: Secondary | ICD-10-CM

## 2021-08-09 DIAGNOSIS — E669 Obesity, unspecified: Secondary | ICD-10-CM

## 2021-08-09 DIAGNOSIS — Z6828 Body mass index (BMI) 28.0-28.9, adult: Secondary | ICD-10-CM

## 2021-08-09 DIAGNOSIS — E538 Deficiency of other specified B group vitamins: Secondary | ICD-10-CM

## 2021-08-09 DIAGNOSIS — E119 Type 2 diabetes mellitus without complications: Secondary | ICD-10-CM | POA: Diagnosis not present

## 2021-08-09 DIAGNOSIS — Z9884 Bariatric surgery status: Secondary | ICD-10-CM

## 2021-08-09 DIAGNOSIS — Z0289 Encounter for other administrative examinations: Secondary | ICD-10-CM

## 2021-08-09 DIAGNOSIS — Z7985 Long-term (current) use of injectable non-insulin antidiabetic drugs: Secondary | ICD-10-CM

## 2021-08-09 DIAGNOSIS — E559 Vitamin D deficiency, unspecified: Secondary | ICD-10-CM

## 2021-08-09 DIAGNOSIS — F411 Generalized anxiety disorder: Secondary | ICD-10-CM

## 2021-08-09 MED ORDER — TIRZEPATIDE 2.5 MG/0.5ML ~~LOC~~ SOAJ
2.5000 mg | SUBCUTANEOUS | 0 refills | Status: DC
  Filled 2021-08-09 – 2021-08-23 (×2): qty 6, 84d supply, fill #0

## 2021-08-09 MED ORDER — SUMATRIPTAN SUCCINATE 50 MG PO TABS
ORAL_TABLET | ORAL | 0 refills | Status: DC
  Filled 2021-08-09: qty 10, 30d supply, fill #0

## 2021-08-09 NOTE — Progress Notes (Signed)
Chief Complaint:   OBESITY [name] [name] is here to discuss her progress with her obesity treatment plan along with follow-up of her obesity related diagnoses. See Medical Weight Management Flowsheet for complete bioelectrical impedance results.  Today's visit was #: 24 Starting weight: 243 lbs Starting date: 06/18/2019 Weight change since last visit: 1 lb Total lbs lost to date: 40 lbs Total weight loss percentage to date: -23.46%  Nutrition Plan: Maintenance. Activity: Cardio and strength training for 45-60 minutes 3 times per week. Anti-obesity medications: Mounjaro 2.5 mg subcutaneously weekly. Reported side effects: None.  Interim History: [name] [name] says she is doing well with hydration - water.  She is increasing Gatorade Zero again.  She says it seems to help more with her electrolytes.  For surgery, she has increased her protein to >100 grams daily and is stopping all medications/supplements except Mounjaro, anxiety med as needed, and Zoloft.  Assessment/Plan:   1. Type 2 diabetes mellitus without complication, without long-term current use of insulin (HCC) Diabetes Mellitus: At goal. Medication: Mounjaro 2.5 mg subcutaneously weekly. Issues reviewed: blood sugar goals, complications of diabetes mellitus, hypoglycemia prevention and treatment, exercise, and nutrition.  Plan:  Continue Mounjaro 2.5 mg subcutaneously weekly. The patient will continue to focus on protein-rich, low simple carbohydrate foods. We reviewed the importance of hydration, regular exercise for stress reduction, and restorative sleep.   Lab Results  Component Value Date   HGBA1C 5.2 06/28/2021   HGBA1C 5.7 (H) 09/23/2020   HGBA1C 5.8 (H) 08/18/2020   Lab Results  Component Value Date   MICROALBUR 10 09/23/2020   LDLCALC 94 06/28/2021   CREATININE 0.92 06/28/2021   - Refill tirzepatide (MOUNJARO) 2.5 MG/0.5ML Pen; Inject 1 pen (2.5 mg) into the skin once a week.  Dispense: 6 mL; Refill: 0  2. B12  deficiency Ria Comment is getting monthly vitamin B12 injections.  Lab Results  Component Value Date   VITAMINB12 253 07/05/2021   3. Vitamin D deficiency At goal. She is taking vitamin D 50,000 IU weekly.  Plan: Continue to take prescription Vitamin D '@50'$ ,000 IU every week as prescribed.  Follow-up for routine testing of Vitamin D, at least 2-3 times per year to avoid over-replacement.  Lab Results  Component Value Date   VD25OH 83.7 06/28/2021   VD25OH 48.6 08/18/2020   VD25OH 43.7 10/14/2019   4. 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   Counseling You may need to eat 3 meals and 2 snacks, or 5 small meals each day in order to reach your protein and calorie goals.  Allow at least 15 minutes for each meal so that you can eat mindfully. Listen to your body so that you do not overeat. For most people, your sleeve or pouch will comfortably hold 4-6 ounces. Eat foods from all food groups. This includes fruits and vegetables, grains, dairy, and meat and other proteins. Include a protein-rich food at every meal and snack, and eat the protein food first.  You should be taking a Bariatric Multivitamin as well as calcium.   5. Migraine without status migrainosus, not intractable, unspecified migraine type She takes Imitrex for abortive therapy.  Will refill today, as per below.  - Refill SUMAtriptan (IMITREX) 50 MG tablet; TAKE 1 TABLET BY MOUTH AT ONSET OF MIGRAINE, MAY REPEAT IN 2 HOURS IF HEADACHE PERSISTS  Dispense: 10 tablet; Refill: 0  6. Generalized anxiety disorder Brylan's anxiety is controlled at this time.  She takes Klonopin as needed and Zoloft 50 mg daily. We will continue to monitor symptoms as they relate to her weight loss journey.  7. Obesity, current BMI 28.4  Course: [name] [name] is currently in the action stage of change. As such, her goal is to continue with weight loss efforts.   Nutrition  goals: She has agreed to Maintenance.   Exercise goals:  As is.  Behavioral modification strategies: increasing lean protein intake, decreasing simple carbohydrates, increasing vegetables, and increasing water intake.  [name] [name] has agreed to follow-up with our clinic in 6 weeks. She was informed of the importance of frequent follow-up visits to maximize her success with intensive lifestyle modifications for her multiple health conditions.   Objective:   Blood pressure 95/60, pulse 70, temperature 97.8 F (36.6 C), temperature source Oral, height '5\' 8"'$  (1.727 m), weight 186 lb (84.4 kg), SpO2 99 %. Body mass index is 28.28 kg/m.  General: Cooperative, alert, well developed, in no acute distress. HEENT: Conjunctivae and lids unremarkable. Cardiovascular: Regular rhythm.  Lungs: Normal work of breathing. Neurologic: No focal deficits.   Lab Results  Component Value Date   CREATININE 0.92 06/28/2021   BUN 17 06/28/2021   NA 141 06/28/2021   K 4.3 06/28/2021   CL 102 06/28/2021   CO2 24 06/28/2021   Lab Results  Component Value Date   ALT 11 06/28/2021   AST 13 06/28/2021   ALKPHOS 59 06/28/2021   BILITOT 0.5 06/28/2021   Lab Results  Component Value Date   HGBA1C 5.2 06/28/2021   HGBA1C 5.7 (H) 09/23/2020   HGBA1C 5.8 (H) 08/18/2020   HGBA1C 5.6 10/14/2019   HGBA1C 5.7 (H) 06/18/2019   Lab Results  Component Value Date   INSULIN 11.0 08/18/2020   INSULIN 11.9 10/14/2019   INSULIN 10.1 06/18/2019   Lab Results  Component Value Date   TSH 1.550 06/28/2021   Lab Results  Component Value Date   CHOL 153 06/28/2021   HDL 47 06/28/2021   LDLCALC 94 06/28/2021   LDLDIRECT 141 (H) 09/23/2020   TRIG 59 06/28/2021   CHOLHDL 3.3 06/28/2021   Lab Results  Component Value Date   VD25OH 83.7 06/28/2021   VD25OH 48.6 08/18/2020   VD25OH 43.7 10/14/2019   Lab Results  Component Value Date   WBC 8.0 06/28/2021   HGB 14.0 06/28/2021   HCT 42.6 07/05/2021   MCV 89  06/28/2021   PLT 255 06/28/2021   Lab Results  Component Value Date   IRON 69 07/05/2021   TIBC 267 07/05/2021   FERRITIN 65 07/05/2021   Attestation Statements:   Reviewed by clinician on day of visit: allergies, medications, problem list, medical history, surgical history, family history, social history, and previous encounter notes.  Time spent on visit including pre-visit chart review and post-visit care and documentation was 43 minutes. Time was spent on: Food choices and timing of food intake reviewed today. I discussed a personalized meal plan with the patient that will help her to lose weight and will improve her obesity-related conditions going forward. I performed a medically necessary appropriate examination and/or evaluation. I discussed the assessment and treatment plan with the patient. Motivational interviewing as well as evidence-based interventions for health behavior change were utilized today including the discussion of self monitoring techniques, problem-solving barriers and SMART goal setting techniques.  An exercise prescription was reviewed.  The patient was provided an opportunity to ask questions and all were answered. The patient agreed with the plan and  demonstrated an understanding of the instructions. Clinical information was updated and documented in the EMR.  I, Water quality scientist, CMA, am acting as transcriptionist for Briscoe Deutscher, DO  I have reviewed the above documentation for accuracy and completeness, and I agree with the above. -  Briscoe Deutscher, DO, MS, FAAFP, DABOM - Family and Bariatric Medicine.

## 2021-08-10 ENCOUNTER — Ambulatory Visit (INDEPENDENT_AMBULATORY_CARE_PROVIDER_SITE_OTHER): Payer: No Typology Code available for payment source | Admitting: Nurse Practitioner

## 2021-08-10 ENCOUNTER — Encounter: Payer: Self-pay | Admitting: Nurse Practitioner

## 2021-08-10 DIAGNOSIS — Z01810 Encounter for preprocedural cardiovascular examination: Secondary | ICD-10-CM | POA: Diagnosis not present

## 2021-08-10 NOTE — Progress Notes (Signed)
? ?Virtual Visit via Telephone Note  ? ?This visit type was conducted due to national recommendations for restrictions regarding the COVID-19 Pandemic (e.g. social distancing) in an effort to limit this patient's exposure and mitigate transmission in our community.  Due to her co-morbid illnesses, this patient is at least at moderate risk for complications without adequate follow up.  This format is felt to be most appropriate for this patient at this time.  The patient did not have access to video technology/had technical difficulties with video requiring transitioning to audio format only (telephone).  All issues noted in this document were discussed and addressed.  No physical exam could be performed with this format.  Please refer to the patient's chart for her  consent to telehealth for [facility]. ? ?Evaluation Performed:  Preoperative cardiovascular risk assessment ?_____________  ? ?Date:  08/10/2021  ? ?Patient ID:  [name] [name], [name] [name] 12/13/1980, MRN [id number] ?Patient Location:  ?Home ?Provider location:   ?Office ? ?Primary Care Provider:  Briscoe Deutscher, DO ?Primary Cardiologist:  Buford Dresser, MD ? ?Chief Complaint  ?  ?41 y.o. y/o female with a h/o chronic dizziness s/p loop recorder, near syncope, bradycardia, palpitations, PACs/PVCs, atypical chest pain, PCOS, prior bariatric surgery, and former tobacco use who is pending abdominoplasty with liposuction of mons pubis, and presents today for telephonic preoperative cardiovascular risk assessment. ? ?Past Medical History  ?  ?Past Medical History:  ?Diagnosis Date  ? Abnormal uterine bleeding   ? Amenorrhea   ? Anemia   ? Anxiety   ? panic attacks  ? Back pain   ? Bradycardia   ? Chest pain   ? Constipation   ? COVID 02/2020  ? Diabetes ([place])   ? Dysmenorrhea   ? Endometriosis   ? Gallbladder problem   ? GERD (gastroesophageal reflux disease)   ? occasional - diet controlled and tums prn  ? HLD (hyperlipidemia)   ? HTN (hypertension)   ?  IBS (irritable bowel syndrome)   ? Infertility, female   ? Insulin resistance   ? Migraine without aura   ? Missed abortion   ? x 2 - both resolved without surgery  ? Obesity   ? PCOS (polycystic ovarian syndrome)   ? PCOS (polycystic ovarian syndrome)   ? PONV (postoperative nausea and vomiting)   ? pe rpatient ; scopalamine patch very helpful   ? Sleep apnea   ? SVD (spontaneous vaginal delivery)   ? x 1  ? Syncope and collapse 06/21/2020  ? Urinary incontinence   ? Vitamin D deficiency   ? ?Past Surgical History:  ?Procedure Laterality Date  ? CHOLECYSTECTOMY    ? COLONOSCOPY  2017  ? polyps  ? CYSTOSCOPY N/A 10/29/2016  ? Procedure: CYSTOSCOPY;  Surgeon: Salvadore Dom, MD;  Location: [facility];  Service: Gynecology;  Laterality: N/A;  ? DILATATION & CURRETTAGE/HYSTEROSCOPY WITH RESECTOCOPE N/A 10/17/2012  ? Procedure: DILATATION & CURETTAGE/HYSTEROSCOPY WITH RESECTOCOPE; Possible Polypectomy, Possible Resectoscopic Myomectomy.;  Surgeon: Floyce Stakes. Pamala Hurry, MD;  Location: [facility];  Service: Gynecology;  Laterality: N/A;  1 hr.  ? DILATION AND CURETTAGE OF UTERUS    ? ENDOMETRIAL BIOPSY    ? ENDOVENOUS ABLATION SAPHENOUS VEIN W/ LASER Right 05/08/2018  ? endovenous laser ablation right greater saphenous vein and stab phlebectomy > 20 incisions right leg by Deitra Mayo MD   ? ESOPHAGOGASTRODUODENOSCOPY ENDOSCOPY  2017  ? Normal  ? HYSTEROSCOPY WITH D & C N/A 12/01/2013  ? Procedure: IUD Removal;  Surgeon: Floyce Stakes. Pamala Hurry, MD;  Location: [facility];  Service: Gynecology;  Laterality: N/A;  90 min.  ? INTRAUTERINE DEVICE (IUD) INSERTION N/A 10/17/2012  ? Procedure: INTRAUTERINE DEVICE (IUD) INSERTION; Mirena;  Surgeon: Floyce Stakes. Pamala Hurry, MD;  Location: [facility];  Service: Gynecology;  Laterality: N/A;  ? LAPAROSCOPIC GASTRIC SLEEVE RESECTION N/A 02/19/2017  ? Procedure: LAPAROSCOPIC GASTRIC SLEEVE RESECTION, UPPER ENDOSCOPY;  Surgeon: Johnathan Hausen, MD;  Location: [facility];  Service: General;  Laterality: N/A;  ?  LAPAROSCOPIC SALPINGO OOPHERECTOMY Left 10/29/2016  ? Procedure: LAPAROSCOPIC SALPINGO OOPHORECTOMY With anterior uterine biopsy;  Surgeon: Salvadore Dom, MD;  Location: [facility];  Service: Gynecology;  Laterality: Left;  ? LASIK  10/06/2018  ? LOOP RECORDER INSERTION N/A 06/22/2020  ? Procedure: LOOP RECORDER INSERTION;  Surgeon: Evans Lance, MD;  Location: [facility];  Service: Cardiovascular;  Laterality: N/A;  ? MOUTH SURGERY    ? wisdom teeth  ? ? ?Allergies ? ?Allergies  ?Allergen Reactions  ? Iohexol Hives  ?   Code: HIVES, Desc: *NEED 13 HR PREDISONE AND BENADRYL  BE FOR IV CONTRAST GIVEN* /DR OLSON PT DEVELOPED HIVES AND ROOF OF MOUTH BEGIN "ITCHING"", Onset Date: [id number] ?  ? Pyridium [Phenazopyridine Hcl] Other (See Comments)  ?  vaginitis symptoms when on it  ? ? ?History of Present Illness  ?  ?[name] [name] is a 41 y.o. female who presents via audio/video conferencing for a telehealth visit today. Pt was last seen in cardiology clinic on 03/21/2021 by Dr. Harrell Gave.  At that time [name] [name] was doing well overall, though she did report ongoing dizziness. In the setting of unremarkable cardiac work-up, there was no clear etiology to her symptoms.  She denies symptoms concerning for angina.The patient is now pending abdominoplasty with liposuction of mons pubis scheduled for 08/24/2021 with Dr. Audelia Hives of [facility] plastic surgery specialist. Since her last visit, she been stable from a cardiac standpoint.  She does have ongoing dizziness, however, this is stable and unchanged since her last visit. Overall, she reports feeling well and denies any new symptoms or concerns.  ? ?She denies chest pain, palpitations, dyspnea, pnd, orthopnea, n, v, syncope, edema, weight gain, or early satiety. All other systems reviewed and are otherwise negative except as noted above.  ? ?Home Medications  ?  ?Prior to Admission medications   ?Medication Sig Start Date End Date Taking?  Authorizing Provider  ?APPLE CIDER VINEGAR PO Take 1 tablet by mouth daily.    [provider]  ?Biotin w/ Vitamins C & E (HAIR/SKIN/NAILS PO) Take 1 tablet by mouth daily.     [provider]  ?clonazePAM (KLONOPIN) 0.5 MG tablet Take 1 tablet by mouth 2  times daily as needed for anxiety. 07/24/21   Briscoe Deutscher, DO  ?cyanocobalamin (,VITAMIN B-12,) 1000 MCG/ML injection Inject 1 ml daily x 3 days, then weekly x 3 weeks, then monthly 07/25/21   Briscoe Deutscher, DO  ?levonorgestrel (MIRENA) 20 MCG/24HR IUD 1 each by Intrauterine route once.    [provider]  ?loratadine (CLARITIN) 10 MG tablet Take 10 mg by mouth daily as needed for allergies.    [provider]  ?Multiple Vitamins-Minerals (BARIATRIC MULTIVITAMINS/IRON PO) Take 1 tablet by mouth in the morning and at bedtime.    [provider]  ?nystatin ointment (MYCOSTATIN) Apply topically 2 times daily. 06/15/21   Briscoe Deutscher, DO  ?ondansetron (ZOFRAN-ODT) 4 MG disintegrating tablet Allow 1 tablet to  dissolve by mouth every 8 hours as needed for nausea or vomiting. 08/03/21   Corena Herter, PA-C  ?Probiotic Product (PROBIOTIC ADVANCED PO) Take 1 capsule by mouth daily.     [provider]  ?sertraline (ZOLOFT) 50 MG tablet Take 1 tablet by mouth daily. 06/13/21   Briscoe Deutscher, DO  ?SUMAtriptan (IMITREX) 50 MG tablet TAKE 1 TABLET BY MOUTH AT ONSET OF MIGRAINE, MAY REPEAT IN 2 HOURS IF HEADACHE PERSISTS 08/09/21 08/09/22  Briscoe Deutscher, DO  ?SYRINGE-NEEDLE, DISP, 3 ML 25G X 1" 3 ML MISC Use to administer b12 injections 07/25/21   Briscoe Deutscher, DO  ?tirzepatide [facility]) 2.5 MG/0.5ML Pen Inject 1 pen (2.5 mg) into the skin once a week. 08/09/21   Briscoe Deutscher, DO  ?tirzepatide [facility]) 5 MG/0.5ML Pen Inject 5 mg into the skin once a week. 06/21/21   Briscoe Deutscher, DO  ?Vitamin D, Ergocalciferol, (DRISDOL) 1.25 MG (50000 UNIT) CAPS capsule TAKE 1 CAPSULE BY MOUTH EVERY 7 DAYS 07/24/21 07/24/22  Briscoe Deutscher, DO  ? ? ?Physical Exam  ?  ?Vital Signs:  [name] [name] does not have vital signs available for review today. ? ?Given telephonic nature of communication, physical exam is limited. ?AAOx3. NAD. Norm

## 2021-08-14 ENCOUNTER — Encounter (HOSPITAL_BASED_OUTPATIENT_CLINIC_OR_DEPARTMENT_OTHER): Payer: Self-pay | Admitting: Plastic Surgery

## 2021-08-14 ENCOUNTER — Other Ambulatory Visit: Payer: Self-pay

## 2021-08-18 ENCOUNTER — Other Ambulatory Visit (HOSPITAL_COMMUNITY): Payer: Self-pay

## 2021-08-21 ENCOUNTER — Ambulatory Visit (INDEPENDENT_AMBULATORY_CARE_PROVIDER_SITE_OTHER): Payer: No Typology Code available for payment source

## 2021-08-21 DIAGNOSIS — R55 Syncope and collapse: Secondary | ICD-10-CM

## 2021-08-23 ENCOUNTER — Other Ambulatory Visit (HOSPITAL_COMMUNITY): Payer: Self-pay

## 2021-08-23 LAB — CUP PACEART REMOTE DEVICE CHECK
Date Time Interrogation Session: 20230414230918
Implantable Pulse Generator Implant Date: 20220216

## 2021-08-24 ENCOUNTER — Other Ambulatory Visit: Payer: Self-pay

## 2021-08-24 ENCOUNTER — Encounter (HOSPITAL_BASED_OUTPATIENT_CLINIC_OR_DEPARTMENT_OTHER)
Admission: RE | Disposition: A | Discharge status: Home / Self Care | Payer: Self-pay | Source: Home / Self Care | Attending: Plastic Surgery

## 2021-08-24 ENCOUNTER — Observation Stay (HOSPITAL_BASED_OUTPATIENT_CLINIC_OR_DEPARTMENT_OTHER)
Admission: RE | Admit: 2021-08-24 | Discharge: 2021-08-25 | Disposition: A | Discharge status: Home / Self Care | Payer: No Typology Code available for payment source | Attending: Plastic Surgery | Admitting: Plastic Surgery

## 2021-08-24 ENCOUNTER — Encounter (HOSPITAL_BASED_OUTPATIENT_CLINIC_OR_DEPARTMENT_OTHER): Payer: Self-pay | Admitting: Plastic Surgery

## 2021-08-24 ENCOUNTER — Ambulatory Visit (HOSPITAL_BASED_OUTPATIENT_CLINIC_OR_DEPARTMENT_OTHER): Payer: No Typology Code available for payment source | Admitting: Anesthesiology

## 2021-08-24 DIAGNOSIS — Z8616 Personal history of COVID-19: Secondary | ICD-10-CM | POA: Insufficient documentation

## 2021-08-24 DIAGNOSIS — M793 Panniculitis, unspecified: Principal | ICD-10-CM | POA: Diagnosis present

## 2021-08-24 DIAGNOSIS — E119 Type 2 diabetes mellitus without complications: Secondary | ICD-10-CM | POA: Diagnosis not present

## 2021-08-24 DIAGNOSIS — Z87891 Personal history of nicotine dependence: Secondary | ICD-10-CM | POA: Diagnosis not present

## 2021-08-24 DIAGNOSIS — I2510 Atherosclerotic heart disease of native coronary artery without angina pectoris: Secondary | ICD-10-CM | POA: Insufficient documentation

## 2021-08-24 DIAGNOSIS — E881 Lipodystrophy, not elsewhere classified: Secondary | ICD-10-CM

## 2021-08-24 HISTORY — PX: ABDOMINOPLASTY/PANNICULECTOMY WITH LIPOSUCTION: SHX5577

## 2021-08-24 LAB — POCT PREGNANCY, URINE: Preg Test, Ur: NEGATIVE

## 2021-08-24 SURGERY — PANNICULECTOMY, ABDOMINAL, WITH LIPOSUCTION
Anesthesia: General | Site: Abdomen

## 2021-08-24 MED ORDER — BUPIVACAINE HCL (PF) 0.25 % IJ SOLN
INTRAMUSCULAR | Status: AC
  Filled 2021-08-24: qty 60

## 2021-08-24 MED ORDER — ONDANSETRON HCL 4 MG/2ML IJ SOLN
4.0000 mg | Freq: Once | INTRAMUSCULAR | Status: AC | PRN
  Administered 2021-08-24: 4 mg via INTRAVENOUS

## 2021-08-24 MED ORDER — EPHEDRINE SULFATE (PRESSORS) 50 MG/ML IJ SOLN
INTRAMUSCULAR | Status: DC | PRN
  Administered 2021-08-24 (×4): 5 mg via INTRAVENOUS

## 2021-08-24 MED ORDER — LIDOCAINE HCL (CARDIAC) PF 100 MG/5ML IV SOSY
PREFILLED_SYRINGE | INTRAVENOUS | Status: DC | PRN
  Administered 2021-08-24: 60 mg via INTRAVENOUS

## 2021-08-24 MED ORDER — KCL IN DEXTROSE-NACL 20-5-0.45 MEQ/L-%-% IV SOLN
INTRAVENOUS | Status: DC
  Filled 2021-08-24: qty 1000

## 2021-08-24 MED ORDER — IBUPROFEN 200 MG PO TABS
400.0000 mg | ORAL_TABLET | Freq: Four times a day (QID) | ORAL | Status: DC
  Administered 2021-08-24 – 2021-08-25 (×2): 400 mg via ORAL
  Filled 2021-08-24 (×2): qty 2

## 2021-08-24 MED ORDER — ESMOLOL HCL 100 MG/10ML IV SOLN
INTRAVENOUS | Status: AC
  Filled 2021-08-24: qty 10

## 2021-08-24 MED ORDER — ACETAMINOPHEN 325 MG PO TABS
325.0000 mg | ORAL_TABLET | Freq: Four times a day (QID) | ORAL | Status: DC
  Administered 2021-08-24: 325 mg via ORAL
  Filled 2021-08-24: qty 1

## 2021-08-24 MED ORDER — FENTANYL CITRATE (PF) 100 MCG/2ML IJ SOLN
INTRAMUSCULAR | Status: AC
Start: 2021-08-24 — End: ?
  Filled 2021-08-24: qty 2

## 2021-08-24 MED ORDER — POLYETHYLENE GLYCOL 3350 17 G PO PACK: 17.0000 g | PACK | Freq: Every day | ORAL | Status: DC | PRN

## 2021-08-24 MED ORDER — HYDROMORPHONE HCL 1 MG/ML IJ SOLN: 1.0000 mg | INTRAMUSCULAR | Status: DC | PRN

## 2021-08-24 MED ORDER — PROPOFOL 500 MG/50ML IV EMUL
INTRAVENOUS | Status: DC | PRN
  Administered 2021-08-24: 25 ug/kg/min via INTRAVENOUS

## 2021-08-24 MED ORDER — ROCURONIUM BROMIDE 100 MG/10ML IV SOLN
INTRAVENOUS | Status: DC | PRN
Start: 2021-08-24 — End: 2021-08-24
  Administered 2021-08-24: 30 mg via INTRAVENOUS
  Administered 2021-08-24: 20 mg via INTRAVENOUS
  Administered 2021-08-24: 50 mg via INTRAVENOUS

## 2021-08-24 MED ORDER — DIPHENHYDRAMINE HCL 12.5 MG/5ML PO ELIX: 12.5000 mg | ORAL_SOLUTION | Freq: Four times a day (QID) | ORAL | Status: DC | PRN

## 2021-08-24 MED ORDER — FENTANYL CITRATE (PF) 100 MCG/2ML IJ SOLN
INTRAMUSCULAR | Status: AC
  Filled 2021-08-24: qty 2

## 2021-08-24 MED ORDER — LIDOCAINE-EPINEPHRINE 1 %-1:100000 IJ SOLN
INTRAMUSCULAR | Status: DC | PRN
  Administered 2021-08-24: 25 mL

## 2021-08-24 MED ORDER — DEXAMETHASONE SODIUM PHOSPHATE 4 MG/ML IJ SOLN
INTRAMUSCULAR | Status: DC | PRN
Start: 2021-08-24 — End: 2021-08-24
  Administered 2021-08-24: 10 mg via INTRAVENOUS

## 2021-08-24 MED ORDER — ESMOLOL HCL 100 MG/10ML IV SOLN
INTRAVENOUS | Status: DC | PRN
  Administered 2021-08-24: 20 mg via INTRAVENOUS

## 2021-08-24 MED ORDER — LACTATED RINGERS IV SOLN: INTRAVENOUS | Status: DC

## 2021-08-24 MED ORDER — ACETAMINOPHEN 500 MG PO TABS
1000.0000 mg | ORAL_TABLET | Freq: Once | ORAL | Status: AC
Start: 2021-08-24 — End: 2021-08-24
  Administered 2021-08-24: 1000 mg via ORAL

## 2021-08-24 MED ORDER — CHLORHEXIDINE GLUCONATE CLOTH 2 % EX PADS: 6.0000 | MEDICATED_PAD | Freq: Once | CUTANEOUS | Status: DC

## 2021-08-24 MED ORDER — SCOPOLAMINE 1 MG/3DAYS TD PT72
MEDICATED_PATCH | TRANSDERMAL | Status: AC
  Filled 2021-08-24: qty 1

## 2021-08-24 MED ORDER — ONDANSETRON HCL 4 MG/2ML IJ SOLN
INTRAMUSCULAR | Status: AC
  Filled 2021-08-24: qty 2

## 2021-08-24 MED ORDER — MIDAZOLAM HCL 2 MG/2ML IJ SOLN
INTRAMUSCULAR | Status: AC
Start: 2021-08-24 — End: ?
  Filled 2021-08-24: qty 2

## 2021-08-24 MED ORDER — SENNA 8.6 MG PO TABS
1.0000 | ORAL_TABLET | Freq: Two times a day (BID) | ORAL | Status: DC
  Administered 2021-08-24: 8.6 mg via ORAL
  Filled 2021-08-24: qty 1

## 2021-08-24 MED ORDER — ONDANSETRON HCL 4 MG/2ML IJ SOLN
4.0000 mg | Freq: Four times a day (QID) | INTRAMUSCULAR | Status: DC | PRN
  Administered 2021-08-25: 4 mg via INTRAVENOUS
  Filled 2021-08-24: qty 2

## 2021-08-24 MED ORDER — ALBUMIN HUMAN 5 % IV SOLN: INTRAVENOUS | Status: DC | PRN

## 2021-08-24 MED ORDER — "VISTASEAL 4 ML SINGLE DOSE KIT "
PACK | CUTANEOUS | Status: DC | PRN
  Administered 2021-08-24: 4 mL via TOPICAL

## 2021-08-24 MED ORDER — HYDROMORPHONE HCL 1 MG/ML IJ SOLN
INTRAMUSCULAR | Status: AC
  Filled 2021-08-24: qty 0.5

## 2021-08-24 MED ORDER — SUGAMMADEX SODIUM 200 MG/2ML IV SOLN
INTRAVENOUS | Status: DC | PRN
  Administered 2021-08-24: 200 mg via INTRAVENOUS

## 2021-08-24 MED ORDER — METHOCARBAMOL 1000 MG/10ML IJ SOLN
500.0000 mg | Freq: Three times a day (TID) | INTRAVENOUS | Status: DC | PRN
  Filled 2021-08-24: qty 5

## 2021-08-24 MED ORDER — BISACODYL 10 MG RE SUPP: 10.0000 mg | Freq: Every day | RECTAL | Status: DC | PRN

## 2021-08-24 MED ORDER — FENTANYL CITRATE (PF) 100 MCG/2ML IJ SOLN
INTRAMUSCULAR | Status: DC | PRN
  Administered 2021-08-24: 100 ug via INTRAVENOUS
  Administered 2021-08-24 (×2): 50 ug via INTRAVENOUS

## 2021-08-24 MED ORDER — ACETAMINOPHEN 500 MG PO TABS
ORAL_TABLET | ORAL | Status: AC
  Filled 2021-08-24: qty 2

## 2021-08-24 MED ORDER — ONDANSETRON HCL 4 MG/2ML IJ SOLN
INTRAMUSCULAR | Status: DC | PRN
  Administered 2021-08-24: 4 mg via INTRAVENOUS

## 2021-08-24 MED ORDER — MELATONIN 3 MG PO TABS
3.0000 mg | ORAL_TABLET | Freq: Every evening | ORAL | Status: DC | PRN
  Administered 2021-08-24: 3 mg via ORAL
  Filled 2021-08-24: qty 1

## 2021-08-24 MED ORDER — DROPERIDOL 2.5 MG/ML IJ SOLN
INTRAMUSCULAR | Status: AC
  Filled 2021-08-24: qty 2

## 2021-08-24 MED ORDER — HYDROMORPHONE HCL 1 MG/ML IJ SOLN
INTRAMUSCULAR | Status: DC | PRN
  Administered 2021-08-24: .5 mg via INTRAVENOUS

## 2021-08-24 MED ORDER — CEFAZOLIN SODIUM-DEXTROSE 2-4 GM/100ML-% IV SOLN
INTRAVENOUS | Status: AC
  Filled 2021-08-24: qty 100

## 2021-08-24 MED ORDER — PROPOFOL 10 MG/ML IV BOLUS
INTRAVENOUS | Status: DC | PRN
  Administered 2021-08-24: 150 mg via INTRAVENOUS

## 2021-08-24 MED ORDER — CEFAZOLIN SODIUM-DEXTROSE 2-4 GM/100ML-% IV SOLN
2.0000 g | INTRAVENOUS | Status: AC
  Administered 2021-08-24: 2 g via INTRAVENOUS

## 2021-08-24 MED ORDER — DIPHENHYDRAMINE HCL 50 MG/ML IJ SOLN: 12.5000 mg | Freq: Four times a day (QID) | INTRAMUSCULAR | Status: DC | PRN

## 2021-08-24 MED ORDER — DROPERIDOL 2.5 MG/ML IJ SOLN
INTRAMUSCULAR | Status: DC | PRN
  Administered 2021-08-24: .625 mg via INTRAVENOUS

## 2021-08-24 MED ORDER — ONDANSETRON 4 MG PO TBDP
4.0000 mg | ORAL_TABLET | Freq: Four times a day (QID) | ORAL | Status: DC | PRN
Start: 2021-08-24 — End: 2021-08-25

## 2021-08-24 MED ORDER — CEFAZOLIN SODIUM-DEXTROSE 2-4 GM/100ML-% IV SOLN
2.0000 g | Freq: Three times a day (TID) | INTRAVENOUS | Status: DC
  Administered 2021-08-24 – 2021-08-25 (×2): 2 g via INTRAVENOUS
  Filled 2021-08-24 (×2): qty 100

## 2021-08-24 MED ORDER — FENTANYL CITRATE (PF) 100 MCG/2ML IJ SOLN
25.0000 ug | INTRAMUSCULAR | Status: DC | PRN
  Administered 2021-08-24 (×4): 25 ug via INTRAVENOUS

## 2021-08-24 MED ORDER — MIDAZOLAM HCL 5 MG/5ML IJ SOLN
INTRAMUSCULAR | Status: DC | PRN
Start: 2021-08-24 — End: 2021-08-24
  Administered 2021-08-24: 2 mg via INTRAVENOUS

## 2021-08-24 MED ORDER — HYDROCODONE-ACETAMINOPHEN 5-325 MG PO TABS
1.0000 | ORAL_TABLET | ORAL | Status: DC | PRN
  Administered 2021-08-24 – 2021-08-25 (×3): 2 via ORAL
  Filled 2021-08-24 (×3): qty 2

## 2021-08-24 MED ORDER — PANTOPRAZOLE SODIUM 40 MG IV SOLR
40.0000 mg | Freq: Every day | INTRAVENOUS | Status: DC
  Administered 2021-08-24: 40 mg via INTRAVENOUS
  Filled 2021-08-24: qty 10

## 2021-08-24 MED ORDER — SCOPOLAMINE 1 MG/3DAYS TD PT72
1.0000 | MEDICATED_PATCH | Freq: Once | TRANSDERMAL | Status: DC
  Administered 2021-08-24: 1.5 mg via TRANSDERMAL

## 2021-08-24 MED ORDER — METHOCARBAMOL 500 MG PO TABS
500.0000 mg | ORAL_TABLET | Freq: Three times a day (TID) | ORAL | Status: DC | PRN
  Administered 2021-08-24: 500 mg via ORAL
  Filled 2021-08-24: qty 1

## 2021-08-24 MED ORDER — LIDOCAINE HCL 1 % IJ SOLN
INTRAVENOUS | Status: DC | PRN
  Administered 2021-08-24: 500 mL

## 2021-08-24 SURGICAL SUPPLY — 68 items
ADH SKN CLS APL DERMABOND .7 (GAUZE/BANDAGES/DRESSINGS) ×3
APPLIER CLIP 9.375 MED OPEN (MISCELLANEOUS) ×2
APR CLP MED 9.3 20 MLT OPN (MISCELLANEOUS) ×1
BAG DECANTER FOR FLEXI CONT (MISCELLANEOUS) IMPLANT
BINDER ABDOMINAL 10 UNV 27-48 (MISCELLANEOUS) IMPLANT
BINDER ABDOMINAL 12 SM 30-45 (SOFTGOODS) ×2 IMPLANT
BIOPATCH RED 1 DISK 7.0 (GAUZE/BANDAGES/DRESSINGS) ×2 IMPLANT
BLADE CLIPPER SURG (BLADE) ×1 IMPLANT
BLADE HEX COATED 2.75 (ELECTRODE) ×2 IMPLANT
BLADE SURG 10 STRL SS (BLADE) ×2 IMPLANT
BLADE SURG 15 STRL LF DISP TIS (BLADE) ×2 IMPLANT
BLADE SURG 15 STRL SS (BLADE) ×2
CANISTER SUCT 1200ML W/VALVE (MISCELLANEOUS) ×2 IMPLANT
CLIP APPLIE 9.375 MED OPEN (MISCELLANEOUS) IMPLANT
COVER BACK TABLE 60X90IN (DRAPES) ×2 IMPLANT
COVER MAYO STAND STRL (DRAPES) ×2 IMPLANT
DERMABOND ADVANCED (GAUZE/BANDAGES/DRESSINGS) ×3
DERMABOND ADVANCED .7 DNX12 (GAUZE/BANDAGES/DRESSINGS) ×2 IMPLANT
DRAIN CHANNEL 19F RND (DRAIN) ×2 IMPLANT
DRAPE LAPAROSCOPIC ABDOMINAL (DRAPES) ×2 IMPLANT
DRSG OPSITE POSTOP 4X12 (GAUZE/BANDAGES/DRESSINGS) ×3 IMPLANT
DRSG PAD ABDOMINAL 8X10 ST (GAUZE/BANDAGES/DRESSINGS) ×6 IMPLANT
ELECT BLADE 4.0 EZ CLEAN MEGAD (MISCELLANEOUS)
ELECT REM PT RETURN 9FT ADLT (ELECTROSURGICAL) ×2
ELECTRODE BLDE 4.0 EZ CLN MEGD (MISCELLANEOUS) IMPLANT
ELECTRODE REM PT RTRN 9FT ADLT (ELECTROSURGICAL) ×1 IMPLANT
EVACUATOR SILICONE 100CC (DRAIN) ×2 IMPLANT
GAUZE SPONGE 4X4 12PLY STRL (GAUZE/BANDAGES/DRESSINGS) IMPLANT
GAUZE XEROFORM 1X8 LF (GAUZE/BANDAGES/DRESSINGS) ×1 IMPLANT
GLOVE BIO SURGEON STRL SZ 6.5 (GLOVE) ×3 IMPLANT
GLOVE ECLIPSE 7.5 STRL STRAW (GLOVE) ×1 IMPLANT
GOWN STRL REUS W/ TWL LRG LVL3 (GOWN DISPOSABLE) ×2 IMPLANT
GOWN STRL REUS W/TWL LRG LVL3 (GOWN DISPOSABLE) ×8
LINER CANISTER 1000CC FLEX (MISCELLANEOUS) ×1 IMPLANT
NDL HYPO 25X1 1.5 SAFETY (NEEDLE) IMPLANT
NDL SAFETY ECLIPSE 18X1.5 (NEEDLE) IMPLANT
NEEDLE HYPO 18GX1.5 SHARP (NEEDLE) ×2
NEEDLE HYPO 25X1 1.5 SAFETY (NEEDLE) ×2 IMPLANT
NS IRRIG 1000ML POUR BTL (IV SOLUTION) ×2 IMPLANT
PACK BASIN DAY SURGERY FS (CUSTOM PROCEDURE TRAY) ×2 IMPLANT
PAD FOAM SILICONE BACKED (GAUZE/BANDAGES/DRESSINGS) ×1 IMPLANT
PENCIL SMOKE EVACUATOR (MISCELLANEOUS) ×2 IMPLANT
PIN SAFETY STERILE (MISCELLANEOUS) ×2 IMPLANT
SLEEVE SCD COMPRESS KNEE MED (STOCKING) ×2 IMPLANT
SPONGE T-LAP 18X18 ~~LOC~~+RFID (SPONGE) ×7 IMPLANT
STRIP SUTURE WOUND CLOSURE 1/2 (MISCELLANEOUS) ×6 IMPLANT
SUT ETHIBOND 0 MO6 C/R (SUTURE) ×1 IMPLANT
SUT MNCRL AB 4-0 PS2 18 (SUTURE) ×8 IMPLANT
SUT MON AB 3-0 SH 27 (SUTURE) ×10
SUT MON AB 3-0 SH27 (SUTURE) ×2 IMPLANT
SUT MON AB 5-0 PS2 18 (SUTURE) IMPLANT
SUT PDS 3-0 CT2 (SUTURE) ×12
SUT PDS AB 2-0 CT2 27 (SUTURE) ×4 IMPLANT
SUT PDS II 3-0 CT2 27 ABS (SUTURE) ×5 IMPLANT
SUT SILK 2 0 SH (SUTURE) IMPLANT
SUT SILK 3 0 PS 1 (SUTURE) ×2 IMPLANT
SUT VICRYL 4-0 PS2 18IN ABS (SUTURE) ×6 IMPLANT
SYR 50ML LL SCALE MARK (SYRINGE) ×1 IMPLANT
SYR BULB IRRIG 60ML STRL (SYRINGE) ×2 IMPLANT
SYR CONTROL 10ML LL (SYRINGE) ×1 IMPLANT
SYR TB 1ML LL NO SAFETY (SYRINGE) ×1 IMPLANT
TOWEL GREEN STERILE FF (TOWEL DISPOSABLE) ×4 IMPLANT
TRAY DSU PREP LF (CUSTOM PROCEDURE TRAY) ×2 IMPLANT
TUBE CONNECTING 20X1/4 (TUBING) ×2 IMPLANT
TUBING INFILTRATION IT-10001 (TUBING) ×1 IMPLANT
TUBING SET GRADUATE ASPIR 12FT (MISCELLANEOUS) ×1 IMPLANT
UNDERPAD 30X36 HEAVY ABSORB (UNDERPADS AND DIAPERS) ×4 IMPLANT
YANKAUER SUCT BULB TIP NO VENT (SUCTIONS) ×2 IMPLANT

## 2021-08-24 NOTE — Anesthesia Preprocedure Evaluation (Addendum)
Anesthesia Evaluation  ?Patient identified by MRN, date of birth, ID band ?Patient awake ? ? ? ?Reviewed: ?Allergy & Precautions, NPO status , Patient's Chart, lab work & pertinent test results ? ?History of Anesthesia Complications ?(+) PONV and history of anesthetic complications ? ?Airway ?Mallampati: II ? ?TM Distance: <3 FB ?Neck ROM: Full ? ? ? Dental ? ?(+) Teeth Intact, Dental Advisory Given, Chipped ?  ?Pulmonary ?sleep apnea , former smoker,  ?  ?Pulmonary exam normal ?breath sounds clear to auscultation ? ? ? ? ? ? Cardiovascular ?hypertension, + CAD  ?Normal cardiovascular exam ?Rhythm:Regular Rate:Normal ? ? ?  ?Neuro/Psych ? Headaches, PSYCHIATRIC DISORDERS Anxiety Chronic low back pain ? ?  ? GI/Hepatic ?Neg liver ROS, GERD  ,  ?Endo/Other  ?diabetes, Type 2 ? Renal/GU ?negative Renal ROS  ? ?  ?Musculoskeletal ?negative musculoskeletal ROS ?(+)  ? Abdominal ?  ?Peds ? Hematology ?negative hematology ROS ?(+)   ?Anesthesia Other Findings ?Day of surgery medications reviewed with the patient. ? Reproductive/Obstetrics ? ?  ? ? ? ? ? ? ? ? ? ? ? ? ? ?  ?  ? ? ? ? ? ? ? ?Anesthesia Physical ?Anesthesia Plan ? ?ASA: 2 ? ?Anesthesia Plan: General  ? ?Post-op Pain Management: Tylenol PO (pre-op)* and Toradol IV (intra-op)*  ? ?Induction: Intravenous ? ?PONV Risk Score and Plan: 4 or greater and Scopolamine patch - Pre-op, Midazolam, Propofol infusion, Dexamethasone and Ondansetron ? ?Airway Management Planned: Oral ETT ? ?Additional Equipment:  ? ?Intra-op Plan:  ? ?Post-operative Plan: Extubation in OR ? ?Informed Consent: I have reviewed the patients History and Physical, chart, labs and discussed the procedure including the risks, benefits and alternatives for the proposed anesthesia with the patient or authorized representative who has indicated his/her understanding and acceptance.  ? ? ? ?Dental advisory given ? ?Plan Discussed with: CRNA ? ?Anesthesia Plan Comments:    ? ? ? ? ? ? ?Anesthesia Quick Evaluation ? ?

## 2021-08-24 NOTE — Op Note (Signed)
Operative Report ? ?Date of operation: 08/24/2021 ? ?Patient: [name] [name], MRN: [id number], 41 y.o. female.   ?Date of birth: 1981/04/03 ? ?Location: [facility] ? ?Preoperative Diagnosis:  Abdominal Lipodystrophy, panniculitis ? ?Postoperative Diagnosis:  Same ? ?Procedure:   ?Panniculectomy ?Abdominoplasty ?Abdominal liposuction ?Mons plasty ? ?Surgeon:  Theodoro Kos Derrall Hicks ? ?Assistant:  Roetta Sessions, PA ? ?Anesthesia:  General ? ?EBL:  100cc ? ?Drains:  2 number 19 blake round drains ? ?Condition:  Stable ? ?Complications: None ? ?Disposition: Recovery Room ? ?Procedure in Detail: ?Patient was seen the morning of her surgery.  The patient was marked and the surgery plan was reviewed.  An IV was placed and antibiotics were administered.  The patient was taken to the operating room and underwent general anesthesia.  A time out was called and all information was confirmed to be correct. SCD's were placed on the legs.  A pillow was placed under the knees. The patient was prepped and draped in the standard sterile fashion. Local was placed into the incision and 2 stab incisions made laterally.  Tumescent was placed in each flank area. Liposuction was performed for improved contour along with the mons area. The marked lower incision was incised with dissection taken to scarpa's fascia and the rectus abdominus fascia. The skin and subcutaneous tissue was then lifted off the fascia to the level of the umbilicus. The umbilicus was then circumscribed and freed from the surrounding skin.  Care was given to not devascularize the stalk.  The abdominal wall was freed above the umbilicus centrally to the xiphoid without lateral dissection to preserve the blood supply.  ? ?The patient was flexed on the table which aided in determining the amount of abdomen skin that would be safely excised.  The pannus was excised and weighed. The OR table was placed in the flat position and the plication of the  rectus done with figure of 8 zero ethibond sutures. The mons was also suspended with 3-0 Monocryl.  The tissue was irrigated with normal saline solution. Two #19 blake round drains were placed and secured to the skin with 3-0 Silk.  The patient was flexed and closure begun. The new position of the umbilicus was determined and a small v shaped incision placed in the abdominal wall. The umbilicus was secured with 4.0 Vicryl at 12, 3, 6, and 9 o'clock. It was inset from the dermis to the abdominal wall and then closed with 5-0 Monocryl. Evicel was placed in the abdominal pocket.The abdominal wall was closed with buried 3-0 Monocryl and subcuticular 4-0 Monocryl.    Dermabond was applied. ABD's and an abdominal binder were placed. Patient was allowed to wake up, extubated and taken on a stretcher in the flexed position to the recovery room. Family was notified at the end of the case.  ? ?The advanced practice practitioner (APP) assisted throughout the case.  The APP was essential in retraction and counter traction when needed to make the case progress smoothly.  This retraction and assistance made it possible to see the tissue plans for the procedure.  The assistance was needed for blood control, tissue re-approximation and assisted with closure of the incision site. ? ?

## 2021-08-24 NOTE — Transfer of Care (Signed)
Immediate Anesthesia Transfer of Care Note ? ?Patient: [name] [name] ? ?Procedure(s) Performed: ABDOMINOPLASTY/PANNICULECTOMY WITH LIPOSUCTION, LIPOSUCTION OF MONS PUBIS (Abdomen) ? ?Patient Location: PACU ? ?Anesthesia Type:General ? ?Level of Consciousness: awake, alert  and oriented ? ?Airway & Oxygen Therapy: Patient Spontanous Breathing and Patient connected to face mask oxygen ? ?Post-op Assessment: Report given to RN and Post -op Vital signs reviewed and stable ? ?Post vital signs: Reviewed and stable ? ?Last Vitals:  ?Vitals Value Taken Time  ?BP 123/80 08/24/21 1233  ?Temp    ?Pulse 86 08/24/21 1238  ?Resp 14 08/24/21 1238  ?SpO2 100 % 08/24/21 1238  ?Vitals shown include unvalidated device data. ? ?Last Pain:  ?Vitals:  ? 08/24/21 0738  ?TempSrc: Oral  ?PainSc: 0-No pain  ?   ? ?Patients Stated Pain Goal: 5 (08/24/21 3353) ? ?Complications: No notable events documented. ?

## 2021-08-24 NOTE — Anesthesia Procedure Notes (Signed)
Procedure Name: Intubation ?Date/Time: 08/24/2021 8:59 AM ?Performed by: Willa Frater, CRNA ?Pre-anesthesia Checklist: Patient identified, Emergency Drugs available, Suction available and Patient being monitored ?Patient Re-evaluated:Patient Re-evaluated prior to induction ?Oxygen Delivery Method: Circle system utilized ?Preoxygenation: Pre-oxygenation with 100% oxygen ?Induction Type: IV induction ?Ventilation: Mask ventilation without difficulty ?Laryngoscope Size: Mac and 3 ?Grade View: Grade I ?Tube type: Oral ?Tube size: 7.0 mm ?Number of attempts: 1 ?Airway Equipment and Method: Stylet and Oral airway ?Placement Confirmation: ETT inserted through vocal cords under direct vision, positive ETCO2 and breath sounds checked- equal and bilateral ?Secured at: 22 cm ?Tube secured with: Tape ?Dental Injury: Teeth and Oropharynx as per pre-operative assessment  ? ? ? ? ?

## 2021-08-24 NOTE — Interval H&P Note (Signed)
History and Physical Interval Note: ? ?08/24/2021 ?7:18 AM ? ?[name] [name]  has presented today for surgery, with the diagnosis of Chronic BIlateral Low Back Pain.  The various methods of treatment have been discussed with the patient and family. After consideration of risks, benefits and other options for treatment, the patient has consented to  Procedure(s): ?ABDOMINOPLASTY/PANNICULECTOMY WITH LIPOSUCTION, LIPOSUCTION OF MONS PUBIS (N/A) as a surgical intervention.  The patient's history has been reviewed, patient examined, no change in status, stable for surgery.  I have reviewed the patient's chart and labs.  Questions were answered to the patient's satisfaction.   ? ? ?[name] [name] ? ? ?

## 2021-08-24 NOTE — Discharge Instructions (Addendum)
INSTRUCTIONS FOR AFTER ABDOMINAL SURGERY  You will likely have some questions about what to expect following your operation.  The following information will help you and your family understand what to expect when you get home.  Following these guidelines will help ensure a smooth recovery and reduce risks of complications.  Postoperative instructions include information on: diet, wound care, medications and physical activity.  AFTER SURGERY Expect to go home after the procedure.  In some cases, you may need to spend one night in the hospital for observation.  DIET This surgery does not require a specific diet.  However, the healthier you eat the better your body can heal. It is important to increasing your protein intake.  Limit foods with high sugar and  carbohydrate content.  Focus on vegetables, meat and other protein sources if you are vegan or vegetarian.  If you undergo liposuction during your procedure it is very important to drink 8 oz of water every hour while awake for 2 days.  If your urine is bright yellow, then it is concentrated, and you need to drink more water.  If you find you are persistently nauseated or unable to take in liquids let us know.  NO TOBACCO USE or EXPOSURE.  This will slow your healing process and increase the risk of a wound.  WOUND CARE Leave the abdominal binder in place for 3 days.  Then you can remove it and shower.  Replace the binder or spanx after your shower.   You may have Topifoam or Lipofoam on.  It is soft and spongy and helps keep you from getting creases if you have liposuction.  This can be removed before the shower and then replaced.  If you need more it is available on [employer] as lipofoam.  If you have steri-strips / tape directly attached to your skin leave them in place. It is OK to get these wet.  No baths, pools or hot tubs for four weeks. We close your incision to leave the smallest and best-looking scar. No ointment or creams on your incisions  until cleared by your surgeon.  No Neosporin (Too many skin reactions with this one).  After the steri-strips are off can use Mederma or Skinuva and start massaging the scar. Continue to wear the binder/spanx or Ace wrap around the clock, including while sleeping, for 6 weeks. This provides added comfort and helps reduce the fluid accumulation at the surgery site.  ACTIVITY No heavy lifting until cleared by the doctor.  For example, no more than a half-gallon of milk.  It is OK to walk and you are encouraged to move your legs to help decrease your risk of getting a blood clot.  It will also help keep you from getting deconditioned.  Every 1 to 2 hours get up and walk for 5 minutes. This will help with a quicker recovery back to normal.  Let pain be your guide so you don't do too much.     SLEEPING / RESTING Sleeping and resting should be in the jack-knife or bent forward position with your head elevated.  This will help reduce pulling on your abdominal incision.  You can elevate your head and upper back with a few pillows and place a pillow under your knees.  Avoid stomach sleeping for 3 months.   WORK Everyone returns to work at different times. As a rough guide, most people take 1 - 2 weeks off prior to returning to work. If you need documentation for your   job give them to the front staff for processing. ? ?DRIVING ?Arrange for someone to bring you home from the hospital.  You may be able to drive a few days after surgery but not while taking any narcotics or valium.  This is for your safety as well as others sharing the road with you. ? ?BOWEL MOVEMENTS ?Constipation can occur after anesthesia and while taking pain medication.  It is important to stay ahead for your comfort.  We recommend taking Milk of Magnesia (2 tablespoons; twice a day) while taking the pain pills. ? ?MEDICATIONS (you may receive and should be started after surgery) ?At your preoperative visit for you history and physical you were  given the following medications: ?Antibiotic: Start this medication when you get home and take according to the instructions on the bottle. ?Zofran 4 mg:  This is to treat nausea and vomiting.  You can take this every 6 hours as needed and only if needed. ?Norco (hydrocodone/acetaminophen) 5/325 mg:  This is only to be used after you have taken the Motrin or the Tylenol. Every 8 hours as needed. ? ?Over the counter Medication to take: ?Ibuprofen (Motrin) 600 mg:  Take this every 6 hours.  If you have additional pain then take 500 mg of the Tylenol.  Only take the Norco after you have tried these two. ?MiraLAX or stool softener of choice: Take this according to the bottle if you take the Norco. ? ?WHEN TO CALL ?Call your surgeon's office if any of the following occur: ? Fever 101 degrees F or greater ? Excessive bleeding or fluid from the incision site. ? Pain that increases over time without aid from the medications ? Redness, warmth, or pus draining from incision sites ? Persistent nausea or inability to take in liquids ? Severe misshapen area that underwent the operation ? ? ?   About my Jackson-Pratt Bulb Drain ? ?What is a Jackson-Pratt bulb? ?A Jackson-Pratt is a soft, round device used to collect drainage. It is connected to a long, thin drainage catheter, which is held in place by one or two small stiches near your surgical incision site. When the bulb is squeezed, it forms a vacuum, forcing the drainage to empty into the bulb. ? ?Emptying the Jackson-Pratt bulb- ?To empty the bulb: ?1. Release the plug on the top of the bulb. ?2. Pour the bulb's contents into a measuring container which your nurse will provide. ?3. Record the time emptied and amount of drainage. Empty the drain(s) as often as your     doctor or nurse recommends. ? ?Date                  Time                    Amount (Drain 1)                 Amount (Drain  2) ? ?_____________________________________________________________________ ? ?_____________________________________________________________________ ? ?_____________________________________________________________________ ? ?_____________________________________________________________________ ? ?_____________________________________________________________________ ? ?_____________________________________________________________________ ? ?_____________________________________________________________________ ? ?_____________________________________________________________________ ? ?Squeezing the Jackson-Pratt Bulb- ?To squeeze the bulb: ?1. Make sure the plug at the top of the bulb is open. ?2. Squeeze the bulb tightly in your fist. You will hear air squeezing from the bulb. ?3. Replace the plug while the bulb is squeezed. ?4. Use a safety pin to attach the bulb to your clothing. This will keep the catheter from     pulling at the bulb insertion site. ? ?When to call your doctor- ?  Call your doctor if: ?Drain site becomes red, swollen or hot. ?You have a fever greater than 101 degrees F. ?There is oozing at the drain site. ?Drain falls out (apply a guaze bandage over the drain hole and secure it with tape). ?Drainage increases daily not related to activity patterns. (You will usually have more drainage when you are active than when you are resting.) ?Drainage has a bad odor. ?  ?

## 2021-08-25 DIAGNOSIS — M793 Panniculitis, unspecified: Secondary | ICD-10-CM | POA: Diagnosis not present

## 2021-08-25 LAB — GLUCOSE, CAPILLARY: Glucose-Capillary: 133 mg/dL — ABNORMAL HIGH (ref 70–99)

## 2021-08-25 LAB — SURGICAL PATHOLOGY

## 2021-08-25 NOTE — Anesthesia Postprocedure Evaluation (Signed)
Anesthesia Post Note ? ?Patient: [name] [name] [facility] ? ?Procedure(s) Performed: ABDOMINOPLASTY/PANNICULECTOMY WITH LIPOSUCTION, LIPOSUCTION OF MONS PUBIS (Abdomen) ? ?  ? ?Patient location during evaluation: PACU ?Anesthesia Type: General ?Level of consciousness: awake and alert ?Pain management: pain level controlled ?Vital Signs Assessment: post-procedure vital signs reviewed and stable ?Respiratory status: spontaneous breathing, nonlabored ventilation, respiratory function stable and patient connected to nasal cannula oxygen ?Cardiovascular status: blood pressure returned to baseline and stable ?Postop Assessment: no apparent nausea or vomiting ?Anesthetic complications: no ? ? ?No notable events documented. ? ?Last Vitals:  ?Vitals:  ? 08/25/21 0700 08/25/21 0745  ?BP: (!) 93/56 103/69  ?Pulse: 71 74  ?Resp: 16 16  ?Temp: 36.8 ?C 37 ?C  ?SpO2: 99% 100%  ?  ?Last Pain:  ?Vitals:  ? 08/25/21 0745  ?TempSrc:   ?PainSc: 4   ? ? ?  ?  ?  ?  ?  ?  ? ?Santa Lighter ? ? ? ? ?

## 2021-08-25 NOTE — Discharge Summary (Signed)
Physician Discharge Summary  ?Patient ID: ?[name] [name] ?MRN: [id number] ?DOB/AGE: 05/26/1980 41 y.o. ? ?Admit date: 08/24/2021 ?Discharge date: 08/25/2021 ? ?Admission Diagnoses: ? ?Discharge Diagnoses:  ?Principal Problem: ?  Panniculitis ? ? ?Discharged Condition: Patient resting comfortably in bed accompanied by family at bedside.  She has been able to use the bedside commode.  Tolerating p.o. intake without difficulty.  She did have an episode of nausea in the middle the night after being given narcotic analgesics, but she did not have any emesis.  Well controlled after Zofran provided. ? ?She has been emptying her drains.  She feels prepared for discharge.  She has already picked up her postoperative medications from the pharmacy and is aware and understanding of red flag signs and symptoms. ? ?Hospital Course: Admitted for observation and pain control after panniculectomy with mons plasty, abdominal liposuction, and abdominoplasty. ? ?Consults: None ? ?Significant Diagnostic Studies: None ? ?Treatments: surgery: Panniculectomy, abdominoplasty, abdominal liposuction, mons plasty.  Treated with analgesics and antiemetics overnight. ? ?Discharge Exam: ?Blood pressure (!) 93/56, pulse 71, temperature 98.2 ?F (36.8 ?C), resp. rate 16, height '5\' 8"'$  (1.727 m), weight 82.8 kg, SpO2 99 %. ?General appearance: alert, cooperative, and no acute distress ?GI: Abdominal binder removed for examination.  Honeycomb dressings intact.  Mild bruising around umbilicus, but no areas of significant swelling or asymmetry.  Abdomen is soft, TTP.  No distention or rigidity.  JP drains intact and functional.  Normal-appearing drainage in bulbs bilaterally. ?Extremities: SCDs remain in place. ? ?Disposition: Discharge disposition: 01-Home or Self Care ? ? ? ? ? ? ?Discharge Instructions   ? ? Diet - low sodium heart healthy   Complete by: As directed ?  ? Increase activity slowly   Complete by: As directed ?  ? ?  ? ?Allergies as of  08/25/2021   ? ?   Reactions  ? Iohexol Hives  ?  Code: HIVES, Desc: *NEED 13 HR PREDISONE AND BENADRYL  BE FOR IV CONTRAST GIVEN* /DR OLSON PT DEVELOPED HIVES AND ROOF OF MOUTH BEGIN "ITCHING"", Onset Date: [id number]  ? Pyridium [phenazopyridine Hcl] Other (See Comments)  ? vaginitis symptoms when on it  ? ?  ? ?  ?Medication List  ?  ? ?TAKE these medications   ? ?APPLE CIDER VINEGAR PO ?Take 1 tablet by mouth daily. ?  ?B-D 3CC LUER-LOK SYR 25GX1" 25G X 1" 3 ML Misc ?Generic drug: SYRINGE-NEEDLE (DISP) 3 ML ?Use to administer b12 injections ?  ?BARIATRIC MULTIVITAMINS/IRON PO ?Take 1 tablet by mouth in the morning and at bedtime. ?  ?clonazePAM 0.5 MG tablet ?Commonly known as: KLONOPIN ?Take 1 tablet by mouth 2  times daily as needed for anxiety. ?  ?Dodex 1000 MCG/ML injection ?Generic drug: cyanocobalamin ?Inject 1 ml daily x 3 days, then weekly x 3 weeks, then monthly ?  ?HAIR/SKIN/NAILS PO ?Take 1 tablet by mouth daily. ?  ?levonorgestrel 20 MCG/24HR IUD ?Commonly known as: MIRENA ?1 each by Intrauterine route once. ?  ?loratadine 10 MG tablet ?Commonly known as: CLARITIN ?Take 10 mg by mouth daily as needed for allergies. ?  ?Mounjaro 5 MG/0.5ML Pen ?Generic drug: tirzepatide ?Inject 5 mg into the skin once a week. ?  ?Mounjaro 2.5 MG/0.5ML Pen ?Generic drug: tirzepatide ?Inject 1 pen (2.5 mg) into the skin once a week. ?  ?nystatin ointment ?Commonly known as: MYCOSTATIN ?Apply topically 2 times daily. ?  ?ondansetron 4 MG disintegrating tablet ?Commonly known as: ZOFRAN-ODT ?Allow 1 tablet to dissolve  by mouth every 8 hours as needed for nausea or vomiting. ?  ?PROBIOTIC ADVANCED PO ?Take 1 capsule by mouth daily. ?  ?sertraline 50 MG tablet ?Commonly known as: ZOLOFT ?Take 1 tablet by mouth daily. ?  ?SUMAtriptan 50 MG tablet ?Commonly known as: IMITREX ?TAKE 1 TABLET BY MOUTH AT ONSET OF MIGRAINE, MAY REPEAT IN 2 HOURS IF HEADACHE PERSISTS ?  ?Vitamin D (Ergocalciferol) 1.25 MG (50000 UNIT) Caps  capsule ?Commonly known as: DRISDOL ?TAKE 1 CAPSULE BY MOUTH EVERY 7 DAYS ?  ? ?  ? ? Follow-up Information   ? ? Dillingham, Loel Lofty, DO Follow up in 10 day(s).   ?Specialty: Plastic Surgery ?Contact information: ?[address] ?[place] [address] ?[phone] ? ? ?  ?  ? ?  ?  ? ?  ? ? ?[facility] ?[place], [place] [address] ?[phone] ? ?Signed: ?Krista Blue ?08/25/2021, 7:37 AM ? ? ?

## 2021-08-28 ENCOUNTER — Telehealth: Payer: Self-pay | Admitting: Surgical

## 2021-08-28 ENCOUNTER — Ambulatory Visit (INDEPENDENT_AMBULATORY_CARE_PROVIDER_SITE_OTHER): Payer: No Typology Code available for payment source | Admitting: Surgical

## 2021-08-28 ENCOUNTER — Encounter (HOSPITAL_BASED_OUTPATIENT_CLINIC_OR_DEPARTMENT_OTHER): Payer: Self-pay | Admitting: Plastic Surgery

## 2021-08-28 VITALS — BP 120/77 | HR 97 | Temp 98.3°F

## 2021-08-28 DIAGNOSIS — M545 Low back pain, unspecified: Secondary | ICD-10-CM

## 2021-08-28 DIAGNOSIS — G8929 Other chronic pain: Secondary | ICD-10-CM

## 2021-08-28 NOTE — Progress Notes (Signed)
? ?Subjective:  ? ?  [name], female    DOB: 24-Jul-1980, 41 y.o.   MRN: [id number] ? ?No chief complaint on file. ? ?HPI: The patient is a 41 y.o. female here for follow-up after her abdominoplasty with liposuction on 08/24/2021 with Dr. Marla Roe.  She is 4 days postop.  She presents today for concerns of increased drain output from her right JP drain. ? ?She is here with her spouse and sister.  She reports that she has been urinating normally, reports she feels fatigued, reports she has not been taking the oxycodone due to fear of constipation.  She reports she has been using multiple modalities for her constipation including MiraLAX, milk of magnesia, saline enemas.  She has been eating some, but not much.  She reports she also has been drinking a lot of juice to help with the constipation she is experiencing.  Family reports the enemas have been helpful.  She reports she has a lot of anxiety. ? ?She reports an episode of emesis, reports a lot of pain with this due to the abdominal muscles feeling very tight. ?Per drainage chart recorded by patient's family, she had a significant amount of output from the right drain on 08/26/2021 for a total of 360 in 24 hours.  The left side and this time drained 60 cc.  On 08/27/2021 patient had 235 cc of output in 24 hours from the right side.  Since 1 AM this morning patient has had 150 cc of output from her JP drain on the right side, this includes the output recorded today in office.  On the left side she has had 25 cc of output since 1 AM. ? ?Today in the office 100 cc was noted in the right drain and 25 cc in the left drain. ? ?Review of Systems  ?Constitutional:  Positive for activity change, appetite change and fatigue. Negative for fever.  ?Respiratory:  Negative for cough, chest tightness and shortness of breath.   ?Cardiovascular:  Negative for chest pain, palpitations and leg swelling.  ?Gastrointestinal:  Positive for abdominal pain, constipation,  nausea and vomiting. Negative for abdominal distention.  ?Genitourinary:  Negative for dysuria and hematuria.  ?Skin:  Positive for color change. Negative for wound.  ?Neurological:  Positive for weakness. Negative for dizziness.  ? ? ?Objective:  ? ?Vital Signs ?BP 120/77 (BP Location: Left Arm, Patient Position: Sitting, Cuff Size: Normal)   Pulse 97   Temp 98.3 ?F (36.8 ?C)   SpO2 100%  ?Vital Signs and Nursing Note Reviewed ?Chaperone present ?Physical Exam ?Constitutional:   ?   General: She is not in acute distress. ?   Appearance: Normal appearance. She is normal weight. She is not ill-appearing, toxic-appearing or diaphoretic.  ?HENT:  ?   Head: Normocephalic and atraumatic.  ?Eyes:  ?   Conjunctiva/sclera: Conjunctivae normal.  ?   Pupils: Pupils are equal, round, and reactive to light.  ?Cardiovascular:  ?   Pulses: Normal pulses.  ?Pulmonary:  ?   Effort: Pulmonary effort is normal.  ?   Breath sounds: Normal breath sounds.  ?Abdominal:  ?   General: Abdomen is flat. There is no distension.  ?   Palpations: Abdomen is soft. There is no fluid wave.  ?   Tenderness: There is abdominal tenderness.  ?   Comments: Abdominal incision appears intact, umbilicus is viable.  She has bruising around the periumbilical area.  She has some tenderness with palpation of her abdomen  as expected. ? ?Right JP drain with blood-tinged serosanguineous drainage, 100 cc.  Left JP drain with blood-tinged serosanguineous drainage, 25 cc.  I do not appreciate any areas of fluid collection with palpation.  ?Musculoskeletal:     ?   General: No swelling.  ?   Right lower leg: No edema.  ?   Left lower leg: No edema.  ?Skin: ?   General: Skin is warm and dry.  ?   Findings: Bruising present.  ?Neurological:  ?   General: No focal deficit present.  ?   Mental Status: She is alert.  ?Psychiatric:     ?   Mood and Affect: Mood normal.     ?   Behavior: Behavior normal.  ? ? ? ? ?Assessment/Plan:  ? ?  ICD-10-CM   ?1. Chronic bilateral  low back pain, unspecified whether sciatica present  M54.50   ? G89.29   ?  ? ? ?41 year old female 4 days postop from abdominoplasty with liposuction and plication of abdomen with Dr. Marla Roe on 08/24/2021.  She presents today for evaluation of her right JP drain.  Reports a lot of output from the drain.  She is here today with her spouse and sister.  They are at home caring for her.   ? ?We specifically discussed recommendations moving forward, increase fluid intake with water, liquid IV in water or Pedialyte.  We discussed options for healthy eating including light soups, avoiding sugars and carbs.  We discussed use of multivitamins to optimize healing. ? ?We discussed using oxycodone as needed for pain, she can also use Tylenol as needed.  She reports she normally takes Valium for anxiety and is going to use this to help with her anxiety. ? ?We specifically discussed ED precautions and reasons to be evaluated in the ED.  We discussed continued compression to help decrease drainage.  We discussed she is likely dehydrated and anemic from surgery, increasing fluid is important as well as eating healthy meals. Vital signs are stable.  Patient, sister and spouse voiced understanding of the ED precautions and are also aware to notify us of any changes. ? ?Patient spent approximately 30 minutes today in office, during that time about 10 cc of fluid drained from the right side after emptying the bulb.  Continue to monitor JP drain output and notify us of any increased output. ? ? ?Recommend calling with questions or concerns. Follow up scheduled for Friday, 4 days from now. ? ? ?Carola Rhine Esha Fincher, PA-C ?08/28/2021, 1:28 PM ? ? ? ? ?

## 2021-08-28 NOTE — Telephone Encounter (Signed)
Over the weekend patient reported increased output from her right JP drain.  Patient was notified to be evaluated in the ED if increased drainage continues to occur.  Called patient this a.m. to check-in at 8:25 AM, no answer.  Did not leave voicemail message. ?

## 2021-08-28 NOTE — Telephone Encounter (Signed)
TR lvm of appt time  09:25am ?

## 2021-09-01 ENCOUNTER — Ambulatory Visit (INDEPENDENT_AMBULATORY_CARE_PROVIDER_SITE_OTHER): Payer: No Typology Code available for payment source | Admitting: Surgical

## 2021-09-01 ENCOUNTER — Encounter: Payer: No Typology Code available for payment source | Admitting: Surgical

## 2021-09-01 DIAGNOSIS — M545 Low back pain, unspecified: Secondary | ICD-10-CM

## 2021-09-01 DIAGNOSIS — G8929 Other chronic pain: Secondary | ICD-10-CM

## 2021-09-01 NOTE — Progress Notes (Signed)
Patient is a 41 year old female here for follow-up after abdominoplasty and liposuction with Dr. Marla Roe on 08/24/2021.  On evaluation on 08/28/2021 she was having a lot of drainage from the bilateral JP drains, she is also having a lot of difficulty with constipation.  She reports today she is feeling much better, she reports feeling 75% better than she did a few days ago.  She is not having any infectious symptoms.  She is here with her son and sister.  She reports JP drain output has significantly slowed down, 40 cc per over the last 24 hours from each drain. ? ?She has increased fluid intake and healthy eating. ? ?She does report that she has not been using her vitamins that she typically takes after bariatric surgery, reports she is also on B12 injections that she takes monthly, she has not had her injection this month.  She is fatigued.  She reports no more emesis, she is urinating normally. ? ?She is not having any fevers, no shortness of breath or chest pain. ? ?Chaperone present on exam ?On exam abdominal incisions are intact, healing well, she does have some bruising of the suprapubic area of her abdomen.  Umbilicus is viable.  Tenderness with palpation of her abdomen as expected, no large fluid collections noted with palpation.  Bilateral JP drains with dark sanguinous drainage. ? ?Bilateral lower extremities with no swelling or edema noted. ?Patient is ambulating.  Mood and behavior is normal.  No focal deficits present.  Breathing is unlabored, pulmonary effort is normal.  ? ?Plan: ?41 year old female 8 days postop from abdominoplasty with liposuction and plication of her abdomen with Dr. Marla Roe.  She is doing much better today than she was a few days ago.  Output from the drain has significantly slowed down, drainage is dark sanguinous.  She is still receiving a lot of care from her spouse and sister, they have been very helpful.  She is not having any infectious symptoms.  ? ?I do not see any  signs of infection on exam, recommend continuing with increased hydration, healthy eating.  Recommend she continue with her B12 injections per prescribing provider.  Recommend restarting her multivitamins in the next few days as well. ? ?Given her fatigue, suspect this is likely related to postoperative anemia, B12 deficiency and deconditioning from surgery.  I do not see any signs of concern today, she is feeling much better. ? ?Recommend continuing to monitor JP drain output, follow-up scheduled for 2 weeks for reevaluation. ?Patient is aware of ED precautions.  I did discuss with the patient if her fatigue continues and does not improve over the next few days, recommend evaluation by PCP for work-up.  Patient was understanding and in agreement with this. ?

## 2021-09-06 ENCOUNTER — Other Ambulatory Visit (HOSPITAL_COMMUNITY): Payer: Self-pay

## 2021-09-06 ENCOUNTER — Encounter (INDEPENDENT_AMBULATORY_CARE_PROVIDER_SITE_OTHER): Payer: Self-pay | Admitting: Adult Health

## 2021-09-06 ENCOUNTER — Ambulatory Visit (INDEPENDENT_AMBULATORY_CARE_PROVIDER_SITE_OTHER): Payer: No Typology Code available for payment source | Admitting: Adult Health

## 2021-09-06 VITALS — BP 107/70 | HR 81 | Temp 98.0°F | Ht 68.0 in | Wt 183.0 lb

## 2021-09-06 DIAGNOSIS — E119 Type 2 diabetes mellitus without complications: Secondary | ICD-10-CM | POA: Diagnosis not present

## 2021-09-06 DIAGNOSIS — Z9189 Other specified personal risk factors, not elsewhere classified: Secondary | ICD-10-CM

## 2021-09-06 DIAGNOSIS — E538 Deficiency of other specified B group vitamins: Secondary | ICD-10-CM | POA: Diagnosis not present

## 2021-09-06 DIAGNOSIS — Z7985 Long-term (current) use of injectable non-insulin antidiabetic drugs: Secondary | ICD-10-CM

## 2021-09-06 DIAGNOSIS — E669 Obesity, unspecified: Secondary | ICD-10-CM

## 2021-09-06 DIAGNOSIS — Z6827 Body mass index (BMI) 27.0-27.9, adult: Secondary | ICD-10-CM

## 2021-09-06 DIAGNOSIS — R110 Nausea: Secondary | ICD-10-CM

## 2021-09-06 DIAGNOSIS — Z9889 Other specified postprocedural states: Secondary | ICD-10-CM

## 2021-09-06 MED ORDER — ONDANSETRON 4 MG PO TBDP
4.0000 mg | ORAL_TABLET | Freq: Three times a day (TID) | ORAL | 0 refills | Status: DC | PRN
  Filled 2021-09-06 – 2021-10-19 (×2): qty 20, 7d supply, fill #0

## 2021-09-06 MED ORDER — CYANOCOBALAMIN 1000 MCG/ML IJ SOLN
INTRAMUSCULAR | 0 refills | Status: DC
  Filled 2021-09-06: qty 10, fill #0
  Filled 2021-10-19: qty 6, 84d supply, fill #0

## 2021-09-06 MED ORDER — "SYRINGE/NEEDLE (DISP) 25G X 1"" 3 ML MISC"
0 refills | Status: DC
  Filled 2021-09-06: qty 10, 30d supply, fill #0
  Filled 2021-10-19: qty 10, 10d supply, fill #0

## 2021-09-08 NOTE — Progress Notes (Signed)
[facility] Summary Report / Loop Recorder 

## 2021-09-15 ENCOUNTER — Encounter: Payer: Self-pay | Admitting: Plastic Surgery

## 2021-09-15 ENCOUNTER — Ambulatory Visit (INDEPENDENT_AMBULATORY_CARE_PROVIDER_SITE_OTHER): Payer: No Typology Code available for payment source | Admitting: Plastic Surgery

## 2021-09-15 ENCOUNTER — Encounter (HOSPITAL_COMMUNITY): Payer: Self-pay | Admitting: *Deleted

## 2021-09-15 ENCOUNTER — Other Ambulatory Visit (HOSPITAL_COMMUNITY): Payer: Self-pay

## 2021-09-15 DIAGNOSIS — M793 Panniculitis, unspecified: Secondary | ICD-10-CM

## 2021-09-15 DIAGNOSIS — Z7189 Other specified counseling: Secondary | ICD-10-CM

## 2021-09-15 NOTE — Progress Notes (Signed)
The patient is a 41 year old female here for follow-up after undergoing a panniculectomy /abdominoplasty.  She is doing really well.  She has quite a bit of swelling and bruising as expected.  There is no sign of seroma or hematoma.  The drain output is minimal.  I went ahead and took the right-sided drain out.  The left is certainly not ready for removal yet.  She should continue with the abdominal binder and spanks.  She can use of Benadryl at night if she is itchy and she can put a little lotion on any of the areas not including the incisions. ?

## 2021-09-17 DIAGNOSIS — Z9889 Other specified postprocedural states: Secondary | ICD-10-CM | POA: Insufficient documentation

## 2021-09-17 DIAGNOSIS — E538 Deficiency of other specified B group vitamins: Secondary | ICD-10-CM | POA: Insufficient documentation

## 2021-09-17 NOTE — Progress Notes (Signed)
? ? ? ?Chief Complaint:  ? ?OBESITY ?Ronnae is here to discuss her progress with her obesity treatment plan along with follow-up of her obesity related diagnoses. Matayah is on the Category 1 Plan and states she is following her eating plan approximately 0% of the time. Miyana states she is Not exercising. ? ?Today's visit was #: 25 ?Starting weight: 243  ?Starting date: 06/18/2019 ?Today's weight: 183 ?Today's date: 09/06/2021 ?Total lbs lost to date: 56 ?Total lbs lost since last in-office visit: 4 ? ?Interim History:  ?On 08/24/21 [name] underwent abdominoplasty/panniculectomy with liposuction on mons pubis.  ?On 09/07/21 she should be cleared for short distance driving, 7-40 miles as tolerated and walking 10 minutes twice daily.   ?She has been focused on protein shakes, eggs, red meat and grilled chicken. ? ?Subjective:  ? ?1. H/O abdominoplasty ?On 08/24/21 [name] underwent abdominoplasty/panniculectomy with liposuction on mons pubis.  ?She has 2 JP drains- both are patent. ? ?2. Type 2 diabetes mellitus without complication, without long-term current use of insulin ([place]) ?Draya's has ambulatory fasting glucose 90-92.   ?She decreased Mounjaro 2.5 mg one week prior to surgery and remains at this dose.  ?She denies mass in neck, dysphagia, dyspepsia, persistent hoarseness, abd pain, or N/V/Constipation. ? ?3. Nausea ?Jailin has experienced post op nausea and vomiting ? ?4. B12 deficiency ?Jandy endorses significant fatigue.   ?She is on monthly B12 injections. ? ?6. At risk for deficient intake of food ?The patient is at a higher than average risk of deficient intake of food due to post op nausea ? ? ?Assessment/Plan:  ? ?1. H/O abdominoplasty ?Lavern is to follow her post-op instructions per her plastic surgeon. ? ?2. Type 2 diabetes mellitus without complication, without long-term current use of insulin ([place]) ?Jem agrees to remain on Mounjaro 2.5 mg weekly. ? ?3. Nausea ?Will refill Ondansetron 4 mg  every 8 hours. ? ?4. B12 deficiency ?Will refill her B12 and needles today. ? ?- cyanocobalamin (,VITAMIN B-12,) 1000 MCG/ML injection; Inject 1 ml once every other week  Dispense: 10 mL; Refill: 0 ?- SYRINGE-NEEDLE, DISP, 3 ML 25G X 1" 3 ML MISC; Use to administer b12 injections  Dispense: 10 each; Refill: 0 ? ?5. At risk for deficient intake of food ?Due to post op N/V. ?She is also on Mounjaro 2.'5mg'$  once weekly. ? ?6. Obesity, current BMI 27.9 ?Adie is currently in the action stage of change. As such, her goal is to continue with weight loss efforts.  ?She has agreed to [employer] and is currently in the maintenance phase.  ? ?Exercise goals: post-op activties per her surgical team ? ?Behavioral modification strategies: increasing lean protein intake, decreasing simple carbohydrates, meal planning and cooking strategies, keeping healthy foods in the home, and planning for success. ? ?Hollyann has agreed to follow-up with our clinic in 12 weeks. She was informed of the importance of frequent follow-up visits to maximize her success with intensive lifestyle modifications for her multiple health conditions.  ? ?Objective:  ? ?Blood pressure 107/70, pulse 81, temperature 98 ?F (36.7 ?C), height '5\' 8"'$  (1.727 m), weight 183 lb (83 kg), SpO2 100 %. ?Body mass index is 27.83 kg/m?. ? ?General: Cooperative, alert, well developed, in no acute distress. ?HEENT: Conjunctivae and lids unremarkable. ?Cardiovascular: Regular rhythm.  ?Lungs: Normal work of breathing. ?Neurologic: No focal deficits.  ? ?Lab Results  ?Component Value Date  ? CREATININE 0.92 06/28/2021  ? BUN 17 06/28/2021  ? NA 141 06/28/2021  ? K 4.3  06/28/2021  ? CL 102 06/28/2021  ? CO2 24 06/28/2021  ? ?Lab Results  ?Component Value Date  ? ALT 11 06/28/2021  ? AST 13 06/28/2021  ? ALKPHOS 59 06/28/2021  ? BILITOT 0.5 06/28/2021  ? ?Lab Results  ?Component Value Date  ? HGBA1C 5.2 06/28/2021  ? HGBA1C 5.7 (H) 09/23/2020  ? HGBA1C 5.8 (H) 08/18/2020  ? HGBA1C 5.6  10/14/2019  ? HGBA1C 5.7 (H) 06/18/2019  ? ?Lab Results  ?Component Value Date  ? INSULIN 11.0 08/18/2020  ? INSULIN 11.9 10/14/2019  ? INSULIN 10.1 06/18/2019  ? ?Lab Results  ?Component Value Date  ? TSH 1.550 06/28/2021  ? ?Lab Results  ?Component Value Date  ? CHOL 153 06/28/2021  ? HDL 47 06/28/2021  ? [place] 94 06/28/2021  ? LDLDIRECT 141 (H) 09/23/2020  ? TRIG 59 06/28/2021  ? CHOLHDL 3.3 06/28/2021  ? ?Lab Results  ?Component Value Date  ? VD25OH 83.7 06/28/2021  ? VD25OH 48.6 08/18/2020  ? VD25OH 43.7 10/14/2019  ? ?Lab Results  ?Component Value Date  ? WBC 8.0 06/28/2021  ? HGB 14.0 06/28/2021  ? HCT 42.6 07/05/2021  ? MCV 89 06/28/2021  ? PLT 255 06/28/2021  ? ?Lab Results  ?Component Value Date  ? IRON 69 07/05/2021  ? TIBC 267 07/05/2021  ? FERRITIN 65 07/05/2021  ? ? ?Attestation Statements:  ? ?Reviewed by clinician on day of visit: allergies, medications, problem list, medical history, surgical history, family history, social history, and previous encounter notes. ? ?I, Althea Charon, am acting as Location manager for [name], NP. ? ?I have reviewed the above documentation for accuracy and completeness, and I agree with the above. -  Willena Jeancharles d. Tarren Sabree, NP-C ? ?

## 2021-09-25 ENCOUNTER — Ambulatory Visit (INDEPENDENT_AMBULATORY_CARE_PROVIDER_SITE_OTHER): Payer: No Typology Code available for payment source

## 2021-09-25 DIAGNOSIS — R55 Syncope and collapse: Secondary | ICD-10-CM

## 2021-09-25 NOTE — Progress Notes (Unsigned)
Patient is a 41 year old female who underwent panniculectomy and abdominoplasty with Dr. Marla Roe on 08/24/2021.   Patient was last seen in the clinic on 09/15/2021.  At this visit, this patient was doing well.  Her right-sided drain was removed and the left sided drain was left in place.  Today,

## 2021-09-26 ENCOUNTER — Ambulatory Visit (INDEPENDENT_AMBULATORY_CARE_PROVIDER_SITE_OTHER): Payer: No Typology Code available for payment source | Admitting: Student

## 2021-09-26 VITALS — BP 117/81 | HR 68 | Temp 98.4°F

## 2021-09-26 DIAGNOSIS — M793 Panniculitis, unspecified: Secondary | ICD-10-CM

## 2021-09-27 LAB — CUP PACEART REMOTE DEVICE CHECK
Date Time Interrogation Session: 20230524094752
Implantable Pulse Generator Implant Date: 20220216

## 2021-10-03 ENCOUNTER — Ambulatory Visit (INDEPENDENT_AMBULATORY_CARE_PROVIDER_SITE_OTHER): Payer: No Typology Code available for payment source | Admitting: Student

## 2021-10-03 ENCOUNTER — Encounter: Payer: Self-pay | Admitting: Student

## 2021-10-03 DIAGNOSIS — M793 Panniculitis, unspecified: Secondary | ICD-10-CM

## 2021-10-03 NOTE — Progress Notes (Signed)
Patient is a 41 year old female who underwent panniculectomy and abdominoplasty with Dr. Marla Roe on 08/24/2021.  Patient was last seen in the clinic on 09/26/2021.  At this visit she was doing well.  Her drain was removed.  She did report a malodor from her umbilicus at this visit.  There were no signs of infection.  Discussed that she may put a little bit of hydrogen peroxide over her umbilicus to help with the malodor.  Today, patient reports she is doing okay.  She reports since our last visit she has had some lower abdominal pain and some swelling in her pubic region.  She reports the pain started approximately 2 days ago.  She denies any fevers, chills, vomiting.  Patient reports she is voiding without issue.  She denies any drainage from the incision sites.  Patient states that Tylenol has been helping.  She also reports she will occasionally have swelling in her lower extremities.  She states the swelling is bilateral.  Patient also reports she still has a malodor from her umbilicus.   Chaperone present on exam.  On exam, patient is sitting upright in no acute distress.  Her umbilicus is healing well and is viable.  There is no purulence or erythema noted. Slight odor noted. There is no sign of infection. Her lower abdominal incision is healing well.  Drain site is healing well.  Abdomen is mildly tender on exam.  There is some minor swelling in the pubic area.  No bruising noted.  Bilateral lower extremities are nonedematous and nonerythematous.  There is no tenderness upon palpation.  There is no pain upon dorsiflexion of the foot.  Discussed with patient that she should continue compressive garments.  Also discussed that compression stockings may help with the swelling as well.  Discussed with patient that swelling is normal after surgery.  Discussed with patient that she may put a little bit of Vaseline on the drain site and that she can continue to put hydrogen peroxide in the umbilicus daily.   We also discussed scar creams for the lower abdominal incision.  Discussed with patient that she may go back to work without restriction.  Also discussed that she may start working out but I recommended that she start light and work her way from there.  Patient to follow-up in 2 weeks.  Patient examined by Dr. Marla Roe as well.

## 2021-10-04 ENCOUNTER — Ambulatory Visit (INDEPENDENT_AMBULATORY_CARE_PROVIDER_SITE_OTHER): Payer: No Typology Code available for payment source | Admitting: Family

## 2021-10-04 ENCOUNTER — Encounter (HOSPITAL_BASED_OUTPATIENT_CLINIC_OR_DEPARTMENT_OTHER): Payer: Self-pay | Admitting: Family

## 2021-10-04 ENCOUNTER — Other Ambulatory Visit (HOSPITAL_COMMUNITY): Payer: Self-pay

## 2021-10-04 VITALS — BP 122/76 | HR 69 | Ht 68.0 in | Wt 190.0 lb

## 2021-10-04 DIAGNOSIS — E785 Hyperlipidemia, unspecified: Secondary | ICD-10-CM

## 2021-10-04 DIAGNOSIS — R600 Localized edema: Secondary | ICD-10-CM

## 2021-10-04 DIAGNOSIS — I2510 Atherosclerotic heart disease of native coronary artery without angina pectoris: Secondary | ICD-10-CM | POA: Diagnosis not present

## 2021-10-04 DIAGNOSIS — R42 Dizziness and giddiness: Secondary | ICD-10-CM

## 2021-10-04 MED ORDER — ROSUVASTATIN CALCIUM 10 MG PO TABS
10.0000 mg | ORAL_TABLET | Freq: Every day | ORAL | 1 refills | Status: DC
  Filled 2021-10-04: qty 90, 90d supply, fill #0

## 2021-10-04 NOTE — Patient Instructions (Addendum)
Medication Instructions:  Your physician has recommended you make the following change in your medication:   START Rosuvastatin one '10mg'$  tablet daily   *If you need a refill on your cardiac medications before your next appointment, please call your pharmacy*   Lab Work: Your physician recommends that you return for lab work in 8 weeks for fasting lipid panel, CMP  Please return for Lab work. You may come to the...   [facility] (3rd floor) [address], [place], [place] [address]  Open: 8am-Noon and 1pm-4:30pm   [facility]- Any location  **no appointments needed**   If you have labs (blood work) drawn today and your tests are completely normal, you will receive your results only by: [employer] (if you have MyChart) OR A paper copy in the mail If you have any lab test that is abnormal or we need to change your treatment, we will call you to review the results.   Testing/Procedures: Non ordered today.    Follow-Up: At [facility], you and your health needs are our priority.  As part of our continuing mission to provide you with exceptional heart care, we have created designated Provider Care Teams.  These Care Teams include your primary Cardiologist (physician) and Advanced Practice Providers (APPs -  Physician Assistants and Nurse Practitioners) who all work together to provide you with the care you need, when you need it.  We recommend signing up for the patient portal called "MyChart".  Sign up information is provided on this After Visit Summary.  MyChart is used to connect with patients for Virtual Visits (Telemedicine).  Patients are able to view lab/test results, encounter notes, upcoming appointments, etc.  Non-urgent messages can be sent to your provider as well.   To learn more about what you can do with MyChart, go to [email or web address].    Your next appointment:   4  month(s)  The format for your next appointment:   In Person  Provider:   Buford Dresser, MD    Other Instructions  Heart Healthy Diet Recommendations: A low-salt diet is recommended. Meats should be grilled, baked, or boiled. Avoid fried foods. Focus on lean protein sources like fish or chicken with vegetables and fruits. The American Heart Association is a Microbiologist!  American Heart Association Diet and Lifeystyle Recommendations   To prevent or reduce lower extremity swelling: Eat a low salt diet. Salt makes the body hold onto extra fluid which causes swelling. Sit with legs elevated. For example, in the recliner or on an [place].  Wear knee-high compression stockings during the daytime. Ones labeled 15-20 mmHg provide good compression.   Important Information About Sugar

## 2021-10-04 NOTE — Progress Notes (Unsigned)
Office Visit    Patient Name: [name] [name] Date of Encounter: 10/04/2021  PCP:  Briscoe Deutscher, [facility]  Cardiologist:  Buford Dresser, MD  Advanced Practice Provider:  No care team member to display Electrophysiologist:  None   Chief Complaint    [name] [name] is a 41 y.o. female with a hx of chronic dizziness/presyncope/syncope, PCOS, prior bariatric surgery, former tobacco use, COVID-19, Nonobstructive CAD presents today for follow up of dizziness.   Past Medical History    Past Medical History:  Diagnosis Date   Abnormal uterine bleeding    Amenorrhea    Anemia    Anxiety    panic attacks   Back pain    Bradycardia    Chest pain    COVID 02/2020   Diabetes ([place])    h/o   Dysmenorrhea    Endometriosis    Gallbladder problem    GERD (gastroesophageal reflux disease)    occasional - diet controlled and tums prn   HLD (hyperlipidemia)    HTN (hypertension)    IBS (irritable bowel syndrome)    Infertility, female    Insulin resistance    Migraine without aura    Missed abortion    x 2 - both resolved without surgery   Obesity    PCOS (polycystic ovarian syndrome)    PONV (postoperative nausea and vomiting)    pe rpatient ; scopalamine patch very helpful    SVD (spontaneous vaginal delivery)    x 1   Syncope and collapse 06/21/2020   Urinary incontinence    Vitamin D deficiency    Past Surgical History:  Procedure Laterality Date   ABDOMINOPLASTY/PANNICULECTOMY WITH LIPOSUCTION N/A 08/24/2021   Procedure: ABDOMINOPLASTY/PANNICULECTOMY WITH LIPOSUCTION, LIPOSUCTION OF MONS PUBIS;  Surgeon: Wallace Going, DO;  Location: [facility];  Service: Plastics;  Laterality: N/A;   CHOLECYSTECTOMY     COLONOSCOPY  2017   polyps   CYSTOSCOPY N/A 10/29/2016   Procedure: CYSTOSCOPY;  Surgeon: Salvadore Dom, MD;  Location: [facility];  Service: Gynecology;  Laterality: N/A;   DILATATION &  CURRETTAGE/HYSTEROSCOPY WITH RESECTOCOPE N/A 10/17/2012   Procedure: DILATATION & CURETTAGE/HYSTEROSCOPY WITH RESECTOCOPE; Possible Polypectomy, Possible Resectoscopic Myomectomy.;  Surgeon: Floyce Stakes. Pamala Hurry, MD;  Location: [facility];  Service: Gynecology;  Laterality: N/A;  1 hr.   DILATION AND CURETTAGE OF UTERUS     ENDOMETRIAL BIOPSY     ENDOVENOUS ABLATION SAPHENOUS VEIN W/ LASER Right 05/08/2018   endovenous laser ablation right greater saphenous vein and stab phlebectomy > 20 incisions right leg by Deitra Mayo MD    ESOPHAGOGASTRODUODENOSCOPY ENDOSCOPY  2017   Normal   HYSTEROSCOPY WITH D & C N/A 12/01/2013   Procedure: IUD Removal;  Surgeon: Floyce Stakes. Pamala Hurry, MD;  Location: [facility];  Service: Gynecology;  Laterality: N/A;  90 min.   INTRAUTERINE DEVICE (IUD) INSERTION N/A 10/17/2012   Procedure: INTRAUTERINE DEVICE (IUD) INSERTION; Mirena;  Surgeon: Floyce Stakes. Pamala Hurry, MD;  Location: [facility];  Service: Gynecology;  Laterality: N/A;   LAPAROSCOPIC GASTRIC SLEEVE RESECTION N/A 02/19/2017   Procedure: LAPAROSCOPIC GASTRIC SLEEVE RESECTION, UPPER ENDOSCOPY;  Surgeon: Johnathan Hausen, MD;  Location: [facility];  Service: General;  Laterality: N/A;   LAPAROSCOPIC SALPINGO OOPHERECTOMY Left 10/29/2016   Procedure: LAPAROSCOPIC SALPINGO OOPHORECTOMY With anterior uterine biopsy;  Surgeon: Salvadore Dom, MD;  Location: [facility];  Service: Gynecology;  Laterality: Left;   LASIK  10/06/2018   LOOP RECORDER  INSERTION N/A 06/22/2020   Procedure: LOOP RECORDER INSERTION;  Surgeon: Evans Lance, MD;  Location: [facility];  Service: Cardiovascular;  Laterality: N/A;   MOUTH SURGERY     wisdom teeth    Allergies  Allergies  Allergen Reactions   Iohexol Hives     Code: HIVES, Desc: *NEED 13 HR PREDISONE AND BENADRYL  BE FOR IV CONTRAST GIVEN* /DR OLSON PT DEVELOPED HIVES AND ROOF OF MOUTH BEGIN "ITCHING"", Onset Date: [id number]    Pyridium [Phenazopyridine Hcl] Other (See Comments)     vaginitis symptoms when on it    History of Present Illness    [name] [name] is a 41 y.o. female with a hx of chronic dizziness/presyncope/syncope, PCOS, prior bariatric surgery, former tobacco use, COVID-19, Nonobstructive CAD last seen 08/10/21  She wore previous event monitor December 2019 and more recent July 2021 with no significant pauses or arrhythmia.  Symptoms did not correlate with arrhythmia.  Orthostatics June 2021 with appropriate elevation of heart rate without drop in blood pressure.  She does have a loop recorder in place. When seen June 2022 she noted she was continuing to drink Gatorade and water, increasing salt intake, and wearing compression socks.  She was trialed on Florinef but after 3 days became nauseous and could not tolerate.  Seen 12/06/2020 for follow-up.  She noted continued dizziness.  She was drinking between 64 and 80 ounce of fluid per her report.  She was adding salt.  She was not wearing compression stockings.  She was having family as well as financial stressors.  Her orthostatic vitals were negative with no change in blood pressure with position changes.  Case discussed with Dr. Harrell Gave and given no significant cardiac abnormality even by loop recorder reports discussed with her that we were unable to provide disability or leave of absence.  Her FMLA paperwork was updated to include 3 days/month for symptoms and office visits.  Evaluated 12/12/2020 by neurology.  Vertigo was easily provoked in clinic with rapid head movements.  She was recommended for repeat sleep study. She had orthostatic vital signs which were positive.   At visit 01/31/21 she was started on midodrine 2.'5mg'$  TID. Did not note improvement in symptoms and discontinued. She was seen 08/10/21 for clearance for abdominoplasty with liposuction of mons pubis and clearance provided.  She presents today for follow-up. She is helping her mother in law with some appointments regarding her newly diagnosed  lung cancer. Her father was also recently diagnosed with prostate cancer which is understandably stressful, offered my condolences. Notes her "heart episodes" are more frequent with increased stress. She reports feeling of her "heart jumping up into her throat" that will happen at night. Discussed likely etiology of PVCs. Not associated with pain nor dyspnea. Also notes sitting on the edge of her bed getting up in the middle of the night and seeing black spots and having to crouch down to regain composure.   ***  She had her procedure with Dr. Governor Specking with me that she was a small complication with ots of bleeding , getting constipated, vomiting - they were worried and - still some swelling. Almost 6 weeks postop. Tentatively going back to work Monday. Still doing gatorade - some swelling in her legs and ankles. After 3 hours of sitting or walking notes she is swelling. She wearing compression leggings and socks with some improvement. No dyspnea, orthopnea, PND.   One episode over the last 6 weeks.   EKGs/Labs/Other Studies  Reviewed:   The following studies were reviewed today:  [place] LE Venous 08/26/2020: Summary:  Right:  - No evidence of deep vein thrombosis from the common femoral through the  popliteal veins.  - No evidence of superficial venous thrombosis.  - The common femoral vein is not competent.  - The great saphenous vein is not competent at the knee and proximal calf.  - The small saphenous vein is competent.     Left:  - No evidence of deep vein thrombosis from the common femoral through the  popliteal veins.  - No evidence of superficial venous thrombosis.  - The deep venous system is not competent.  - The great saphenous vein is ablated proximally and competent distally.  - The small saphenous vein is not competent.   Echo 06/22/2020: 1. Left ventricular ejection fraction, by estimation, is 60 to 65%. The  left ventricle has normal function. The left ventricle has  no regional  wall motion abnormalities. There is mild left ventricular hypertrophy.  Left ventricular diastolic parameters  were normal.   2. Right ventricular systolic function is normal. The right ventricular  size is normal. Tricuspid regurgitation signal is inadequate for assessing  PA pressure.   3. The mitral valve is normal in structure. No evidence of mitral valve  regurgitation.   4. The aortic valve was not well visualized. Aortic valve regurgitation  is not visualized. No aortic stenosis is present.   5. The inferior vena cava is normal in size with greater than 50%  respiratory variability, suggesting right atrial pressure of 3 mmHg.   Loop Recorder Insertion 06/22/2020: CONCLUSIONS:  1. Successful implantation of a [employer] Reveal LINQ implantable loop recorder for unexplained syncope  2. No early apparent complications.    Monitor 11/23/2019: 12 days of data recorded on Zio monitor. Patient had a min HR of 37 bpm, max HR of 180 bpm, and avg HR of 67 bpm. Predominant underlying rhythm was Sinus Rhythm. No VT, SVT, atrial fibrillation, high degree block, or pauses noted. Isolated atrial and ventricular ectopy was rare (<1%). There were 91 triggered events. These were all sinus, with occasional isolate PAC or PVC. No significant arrhythmias detected.   Monitor 04/21/18 NSR Rare PAC/PVC s Symptoms do not correlate with arrhythmia Average HR 69 bpm   CT coronary 03/23/18 Coronary Arteries:  Normal coronary origin.  Right dominance.   RCA is a large dominant artery that gives rise to PDA and PLVB. There is no plaque.   Left main is a large artery that gives rise to LAD and LCX arteries. Left main has no plaque.   LAD is a large vessel that gives rise to two diagonal arteries. Proximal LAD has mild calcified plaque with associated stenosis 0-25%. Mid LAD has a mild non-calcified plaque with associated stenosis 0-25%.   LCX is a small non-dominant artery that gives rise  to one small OM1 branch. There is no plaque.   Other findings:   Normal pulmonary vein drainage into the left atrium.   Normal let atrial appendage without a thrombus.   IMPRESSION: 1. Coronary calcium score of 46. This was 50 percentile for age and sex matched control.   2. Normal coronary origin with right dominance.   3. 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 Aggressive medical management is recommended.   4. Mildly dilated pulmonary artery measuring 32 mm suggestive of pulmonary hypertension.    EKG:  No EKG today.  Recent Labs: 06/28/2021: ALT 11; BUN  17; Creatinine, Ser 0.92; Hemoglobin 14.0; Platelets 255; Potassium 4.3; Sodium 141; TSH 1.550  Recent Lipid Panel    Component Value Date/Time   CHOL 153 06/28/2021 0803   TRIG 59 06/28/2021 0803   HDL 47 06/28/2021 0803   CHOLHDL 3.3 06/28/2021 0803   CHOLHDL 3.5 03/23/2018 0623   VLDL 8 03/23/2018 0623   LDLCALC 94 06/28/2021 0803   LDLDIRECT 141 (H) 09/23/2020 1130    Home Medications   No outpatient medications have been marked as taking for the 10/04/21 encounter (Appointment) with Loel Dubonnet, NP.     Review of Systems      All other systems reviewed and are otherwise negative except as noted above.  Physical Exam    VS:  There were no vitals taken for this visit. , BMI There is no height or weight on file to calculate BMI.  Wt Readings from Last 3 Encounters:  09/06/21 183 lb (83 kg)  08/24/21 182 lb 8.7 oz (82.8 kg)  08/09/21 186 lb (84.4 kg)   *** GEN: Well nourished, well developed, in no acute distress. HEENT: normal. Neck: Supple, no JVD, carotid bruits, or masses. Cardiac: RRR, no murmurs, rubs, or gallops. No clubbing, cyanosis, edema.  Radials/PT 2+ and equal bilaterally.  Respiratory:  Respirations regular and unlabored, clear to auscultation bilaterally. GI: Soft, nontender, nondistended. MS: No deformity or atrophy. Skin: Warm and dry, no rash. Neuro:  Strength  and sensation are intact. Psych: Normal affect.  Assessment & Plan    Near-syncope / Sinus bradycardia / Orthostatic hypotension / Dizziness  - Previous event monitors and loop recorder with no significant arrhythmia. Suspicion for vagal/autonomic etiology. Previously intolerant of Florinef with nausea. 11/06/20 loop recorder report with 2 episodes showing SR/ST. 01/2021 loop recorder report with no significant arrhythmia.  She did have recent visit with neurology with positive orthostatic vital signs were previously were negative.  As such, start midodrine 2.5 mg 3 times daily with meals with prompt follow-up to assess response.  Her primary care mentioned possible MRA to evaluate carotid arteries, will discuss with Dr. Harrell Gave whether to proceed with MRA vs carotid duplex.  ***  Nonobstructive CAD - Cardiac CTA 03/2018 mild nonobstructive CAD in prox and mid LAD, coronary calcium score of 46 (99th percentile). Stable with no anginal symptoms. No indication for ischemic evaluation.  No beta-blocker due to baseline bradycardia.  GDMT includes statin, as below. ***taking Aspirin EC '81mg'$  QOD.   HLD, LDL goal <70 - 09/23/20 LDL 141. 08/2021 LDL 83. *** Recently started on rosuvastatin 10 mg daily.  Lab slips provided for repeat CMP, lipid panel as she has completed 6 weeks of therapy that will not collect today as she has eaten already.  If LDL not at goal of less than 70 plan to increase dose of rosuvastatin.  ***  Disposition: Follow up in 4 months with Dr. Harrell Gave   Signed, Loel Dubonnet, NP 10/04/2021, 7:56 AM [place]

## 2021-10-05 ENCOUNTER — Encounter (HOSPITAL_BASED_OUTPATIENT_CLINIC_OR_DEPARTMENT_OTHER): Payer: Self-pay | Admitting: Family

## 2021-10-06 ENCOUNTER — Encounter: Payer: No Typology Code available for payment source | Admitting: Student

## 2021-10-09 ENCOUNTER — Telehealth: Payer: Self-pay | Admitting: Plastic Surgery

## 2021-10-09 NOTE — Telephone Encounter (Signed)
Attempted to contact pt with response from provider. No answer, left vm

## 2021-10-09 NOTE — Telephone Encounter (Signed)
Patient called to request extension of FMLA leave; stated she went back to work today and couldn't last four (4) hours. Patient stated her employer doesn't offer a stay-at-home position. Patient requesting Dr. Eusebio Friendly recommendations for how much  longer she should be out.   Unsure if patient should provide additional paperwork, or if follow up appointment needed. Patient stated she thought FMLA paperwork on file could just be extended without providing [place] with additional paperwork.   Please advise at [phone].

## 2021-10-10 ENCOUNTER — Encounter: Payer: Self-pay | Admitting: Student

## 2021-10-10 NOTE — Telephone Encounter (Signed)
Spoke with [name] [name] this morning. States she has returned to work full-time and has had difficulty with length of shift and duties including pushing heavy loads. Discussed with Donnamarie Rossetti, PA-c. Work note will be available at front desk for pt to pick up today

## 2021-10-11 NOTE — Progress Notes (Signed)
[facility] Summary Report / Loop Recorder 

## 2021-10-12 ENCOUNTER — Telehealth: Payer: Self-pay | Admitting: Plastic Surgery

## 2021-10-12 NOTE — Telephone Encounter (Signed)
Patient called to notify provider her employer is sending over documentation (from [facility]) which they require; provider needs to specify what patient's work restrictions are and to indicate patient can't work more than 20 hours per week. Current restrictions last until 10/20/21. Letter should also indicate patient will be reevaluated at next appointment.   Please contact patient at [phone] if further clarification required.

## 2021-10-13 ENCOUNTER — Encounter: Payer: Self-pay | Admitting: Plastic Surgery

## 2021-10-14 ENCOUNTER — Other Ambulatory Visit (HOSPITAL_COMMUNITY): Payer: Self-pay

## 2021-10-16 NOTE — Progress Notes (Signed)
Patient is a 41 year old female who underwent panniculectomy, abdominoplasty, and mons plasty with Dr. Marla Roe on 08/24/2021.  Patient was last seen in the clinic on 10/03/2021.  At this visit she was doing well.  She did report some mild abdominal pain and mild swelling in the her pubic region.  On exam, incisions were healing well and umbilicus was viable.  There were no signs of infection.  Encouraged patient to wear compressive stockings and we discussed scar creams.   Today, patient reports she is doing well.  She states that she has gone back to work, but she feels some swelling in her abdomen and legs after working for 4 hours.  She states she has been wearing compression stockings and compressive spanks which helps with the swelling.  Patient reports that she has been voiding.  She does report that she is at times slightly incontinent.  Patient denies any other issues at this time.  Chaperone present on exam.  On exam, patient is sitting upright in no acute distress.  Her abdomen is soft.  There is no erythema or ecchymosis.  Umbilicus is viable and the incision is intact.  There is no malodor, drainage or erythema.  The lower abdominal incision is intact.  There is no erythema or drainage.  Previous drain site healing well.  There is some minor swelling noted to the pubic area.  There is some trace swelling to the legs bilaterally. No erythema or tenderness upon palpation.   Discussed with patient that she should continue compressive stockings and compressive spanks.  Encouraged patient to gradually increase her activities.  Discussed with patient that she may use scar cream such as Mederma or silicone strips over the incision sites.  Recommended patient follow-up with her PCP for episodes of slight incontinence.  Patient to follow-up in 3-4 weeks.  Pictures were obtained and placed in the patient's chart with patient permission.  Discussed objective findings and plan with Dr. Marla Roe

## 2021-10-17 ENCOUNTER — Other Ambulatory Visit (HOSPITAL_COMMUNITY): Payer: Self-pay

## 2021-10-17 ENCOUNTER — Encounter: Payer: Self-pay | Admitting: Plastic Surgery

## 2021-10-17 ENCOUNTER — Telehealth: Payer: Self-pay | Admitting: Plastic Surgery

## 2021-10-17 ENCOUNTER — Ambulatory Visit (INDEPENDENT_AMBULATORY_CARE_PROVIDER_SITE_OTHER): Payer: No Typology Code available for payment source | Admitting: Student

## 2021-10-17 DIAGNOSIS — M793 Panniculitis, unspecified: Secondary | ICD-10-CM

## 2021-10-17 MED ORDER — MOUNJARO 5 MG/0.5ML ~~LOC~~ SOAJ
SUBCUTANEOUS | 0 refills | Status: DC
  Filled 2021-10-17 – 2021-10-19 (×2): qty 6, 84d supply, fill #0

## 2021-10-17 NOTE — Telephone Encounter (Signed)
Patient is going to need to have the [facility] paperwork updated with new restrictions and dates/times from the visit today. Per the letter to return to work written today:   [name] [name] was seen in my clinic on 10/17/2021. She may return to work but may not lift, push, or pull no more than 15 lbs of weight until Monday June 26th, 2023. She will continue to work 4 hour shifts until June 26th and then after this date, she may return to working 10 hour shifts with no physical restrictions.

## 2021-10-18 ENCOUNTER — Other Ambulatory Visit (HOSPITAL_COMMUNITY): Payer: Self-pay

## 2021-10-18 ENCOUNTER — Other Ambulatory Visit (INDEPENDENT_AMBULATORY_CARE_PROVIDER_SITE_OTHER): Payer: Self-pay | Admitting: Adult Health

## 2021-10-18 ENCOUNTER — Encounter (INDEPENDENT_AMBULATORY_CARE_PROVIDER_SITE_OTHER): Payer: Self-pay | Admitting: Adult Health

## 2021-10-18 MED ORDER — TIRZEPATIDE 2.5 MG/0.5ML ~~LOC~~ SOAJ
2.5000 mg | SUBCUTANEOUS | 0 refills | Status: DC
  Filled 2021-10-18 – 2021-10-31 (×2): qty 2, 28d supply, fill #0

## 2021-10-18 NOTE — Telephone Encounter (Signed)
Pt has been off Mounjaro for 8 wks and would like to restart at 2.5. please advise.  Follow-up scheduled for 12/07/21

## 2021-10-18 NOTE — Telephone Encounter (Signed)
Spoke to [employer]. She stated she received the disability paperwork and will get with Lyndee Leo, [place] and fill out for patient regarding yesterdays visit. (10/18/2021) Pt aware.

## 2021-10-19 ENCOUNTER — Telehealth: Payer: Self-pay | Admitting: *Deleted

## 2021-10-19 ENCOUNTER — Other Ambulatory Visit (HOSPITAL_COMMUNITY): Payer: Self-pay

## 2021-10-19 NOTE — Telephone Encounter (Signed)
Updated STD forms faxed to [facility] with fax confirmation received

## 2021-10-23 ENCOUNTER — Other Ambulatory Visit (HOSPITAL_COMMUNITY): Payer: Self-pay

## 2021-10-30 ENCOUNTER — Ambulatory Visit (INDEPENDENT_AMBULATORY_CARE_PROVIDER_SITE_OTHER): Payer: No Typology Code available for payment source

## 2021-10-30 DIAGNOSIS — R55 Syncope and collapse: Secondary | ICD-10-CM | POA: Diagnosis not present

## 2021-10-31 ENCOUNTER — Other Ambulatory Visit (HOSPITAL_COMMUNITY): Payer: Self-pay

## 2021-11-01 LAB — CUP PACEART REMOTE DEVICE CHECK
Date Time Interrogation Session: 20230619230431
Implantable Pulse Generator Implant Date: 20220216

## 2021-11-06 ENCOUNTER — Encounter (HOSPITAL_BASED_OUTPATIENT_CLINIC_OR_DEPARTMENT_OTHER): Payer: Self-pay

## 2021-11-06 NOTE — Telephone Encounter (Signed)
FMLA assistance

## 2021-11-13 NOTE — Telephone Encounter (Signed)
Hey patient following up on this. Thanks!

## 2021-11-16 NOTE — Progress Notes (Deleted)
Patient is a 41 year old female who underwent panniculectomy, abdominoplasty and mons plasty with Dr. Marla Roe on 08/24/2021.  She presents for follow-up.  Patient was last seen in the clinic on 10/17/2021.  At this visit, she was overall doing well.  She reported that she had gone back to work but was having difficulty after some time while working.  Patient denied any other issues at that time related to her surgery.  On exam, patient's incision sites are healing well.  At this visit, patient was to gradually increase her activity.  Also discussed starting scar creams.  Today,

## 2021-11-17 ENCOUNTER — Ambulatory Visit: Payer: No Typology Code available for payment source | Admitting: Student

## 2021-11-20 NOTE — Telephone Encounter (Signed)
FYI

## 2021-11-22 ENCOUNTER — Telehealth: Payer: Self-pay | Admitting: Cardiology

## 2021-11-22 NOTE — Telephone Encounter (Signed)
-  Per Dr. Harrell Gave, recommend increasing fluids, wearing compression stocking, and increase activity to increase HR. -MD also recommends contacting EP to schedule appointment for loop interrogations   Pt made aware and verbalized understanding

## 2021-11-22 NOTE — Telephone Encounter (Signed)
STAT if HR is under 50 or over 120 (normal HR is 60-100 beats per minute)  What is your heart rate? 41  Do you have a log of your heart rate readings (document readings)?  41 38 45 37  Do you have any other symptoms? When it drops down under 40 she says she gets dizzy and nauseous.

## 2021-11-22 NOTE — Telephone Encounter (Signed)
-  Pt called reporting she's noticed a decrease in HR since yesterday morning- -Pt reported she feels tired, weak, nauseated ,and occasional dizziness, but denies feeling like she's going to pass or black out. -Pt stated she's increased her fluid as instructed previous by MD but HR remains low  7/18- 39 8 am          42 10 am          46 12.01 pm          41 6 pm          45 9 pm          38 6 am  7/19- 41 8 am          79 currently  Will forward to MD for recommendations

## 2021-11-24 NOTE — Progress Notes (Signed)
[facility] Summary Report / Loop Recorder 

## 2021-11-26 NOTE — Progress Notes (Unsigned)
Cardiology Office Note Date:  11/28/2021  Patient ID:  [name] [name], [name] [name] 1980/08/18, MRN [id number] PCP:  Briscoe Deutscher, DO  Cardiologist:  Dr. Harrell Gave Electrophysiologist: Dr. Lovena Le    Chief Complaint: low HR? BP?  History of Present Illness: [name] [name] is a 41 y.o. female with history of  chronic dizziness/presyncope/syncope, PCOS, prior bariatric surgery, former tobacco use, COVID-19, Nonobstructive CAD  She comes in today to be seen for dr. Lovena Le, last seen by him May 2022, continued to struggle with dizziness, lightheaded/weak spells that could last hours.  No syncope, learning to get sitting/supine quickly with the onset of symptoms. Loop noted some ST, "no long pauses", started on Florinef  Developed nausea and florinef stopped  Following with cardiology team  Most recently C. Walker, NP 10/04/21,  noted prior evaluation on 12/12/2020 by neurology.  Vertigo was easily provoked in clinic with rapid head movements.  She was recommended for repeat sleep study. She had orthostatic vital signs which were positive.  01/31/21 she was started on midodrine 2.'5mg'$  TID. Did not note improvement in symptoms and discontinued.   At the time of this visit she was s/p abdominoplasty with liposuction of mons pubis about 6 weeks, having some post op symptoms including some edema, using some compression stockings. Episodes have overall been less bothersome than previous.  Still with FMLA for up to 3 days of work per month.  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 Planned to resume her statin  Pt reached out requesting FMLA forms be filled out  11/22/21, called reporting dizziness with slow HRs observed, advised to maintain adequate hydration, wear her compression stockings and being seen by EP to review HR data/loop recorder  TODAY She has had an up tick in symptoms of late and with her pulse Ox noted some HRs to 38-40's and the behavior of her symptoms with these are  different, take longer to recover from. No syncope Here in the waiting room she had a slight episodes and made a symptom episode. She had one this Am as well.  Sometimes an awareness of her heart beating, not always  Episode are mostly the same she will start with ringing in her ears, vision starts to get tunneled, feels weak, lightheaded and then has to get to seated or supine, feels a bit shaky after wards  No CP No SOB   Device information MDT LINQ II, implanted 06/22/2020   Past Medical History:  Diagnosis Date   Abnormal uterine bleeding    Amenorrhea    Anemia    Anxiety    panic attacks   Back pain    Bradycardia    Chest pain    COVID 02/2020   Diabetes ([place])    h/o   Dysmenorrhea    Endometriosis    Gallbladder problem    GERD (gastroesophageal reflux disease)    occasional - diet controlled and tums prn   HLD (hyperlipidemia)    HTN (hypertension)    IBS (irritable bowel syndrome)    Infertility, female    Insulin resistance    Migraine without aura    Missed abortion    x 2 - both resolved without surgery   Obesity    PCOS (polycystic ovarian syndrome)    PONV (postoperative nausea and vomiting)    pe rpatient ; scopalamine patch very helpful    SVD (spontaneous vaginal delivery)    x 1   Syncope and collapse 06/21/2020   Urinary incontinence  Vitamin D deficiency     Past Surgical History:  Procedure Laterality Date   ABDOMINOPLASTY/PANNICULECTOMY WITH LIPOSUCTION N/A 08/24/2021   Procedure: ABDOMINOPLASTY/PANNICULECTOMY WITH LIPOSUCTION, LIPOSUCTION OF MONS PUBIS;  Surgeon: Wallace Going, DO;  Location: [place];  Service: Plastics;  Laterality: N/A;   CHOLECYSTECTOMY     COLONOSCOPY  2017   polyps   CYSTOSCOPY N/A 10/29/2016   Procedure: CYSTOSCOPY;  Surgeon: Salvadore Dom, MD;  Location: [facility];  Service: Gynecology;  Laterality: N/A;   DILATATION & CURRETTAGE/HYSTEROSCOPY WITH RESECTOCOPE N/A 10/17/2012    Procedure: DILATATION & CURETTAGE/HYSTEROSCOPY WITH RESECTOCOPE; Possible Polypectomy, Possible Resectoscopic Myomectomy.;  Surgeon: Floyce Stakes. Pamala Hurry, MD;  Location: [facility];  Service: Gynecology;  Laterality: N/A;  1 hr.   DILATION AND CURETTAGE OF UTERUS     ENDOMETRIAL BIOPSY     ENDOVENOUS ABLATION SAPHENOUS VEIN W/ LASER Right 05/08/2018   endovenous laser ablation right greater saphenous vein and stab phlebectomy > 20 incisions right leg by Deitra Mayo MD    ESOPHAGOGASTRODUODENOSCOPY ENDOSCOPY  2017   Normal   HYSTEROSCOPY WITH D & C N/A 12/01/2013   Procedure: IUD Removal;  Surgeon: Floyce Stakes. Pamala Hurry, MD;  Location: [facility];  Service: Gynecology;  Laterality: N/A;  90 min.   INTRAUTERINE DEVICE (IUD) INSERTION N/A 10/17/2012   Procedure: INTRAUTERINE DEVICE (IUD) INSERTION; Mirena;  Surgeon: Floyce Stakes. Pamala Hurry, MD;  Location: [facility];  Service: Gynecology;  Laterality: N/A;   LAPAROSCOPIC GASTRIC SLEEVE RESECTION N/A 02/19/2017   Procedure: LAPAROSCOPIC GASTRIC SLEEVE RESECTION, UPPER ENDOSCOPY;  Surgeon: Johnathan Hausen, MD;  Location: [facility];  Service: General;  Laterality: N/A;   LAPAROSCOPIC SALPINGO OOPHERECTOMY Left 10/29/2016   Procedure: LAPAROSCOPIC SALPINGO OOPHORECTOMY With anterior uterine biopsy;  Surgeon: Salvadore Dom, MD;  Location: [facility];  Service: Gynecology;  Laterality: Left;   LASIK  10/06/2018   LOOP RECORDER INSERTION N/A 06/22/2020   Procedure: LOOP RECORDER INSERTION;  Surgeon: Evans Lance, MD;  Location: [facility];  Service: Cardiovascular;  Laterality: N/A;   MOUTH SURGERY     wisdom teeth    Current Outpatient Medications  Medication Sig Dispense Refill   APPLE CIDER VINEGAR PO Take 1 tablet by mouth daily.     aspirin EC 81 MG tablet Take 81 mg by mouth every other day. Swallow whole.     Biotin w/ Vitamins C & E (HAIR/SKIN/NAILS PO) Take 1 tablet by mouth daily.      clonazePAM (KLONOPIN) 0.5 MG tablet Take 1 tablet by mouth 2   times daily as needed for anxiety. 20 tablet 1   cyanocobalamin (,VITAMIN B-12,) 1000 MCG/ML injection Inject 1 ml once every other week 10 mL 0   levonorgestrel (MIRENA) 20 MCG/24HR IUD 1 each by Intrauterine route once.     loratadine (CLARITIN) 10 MG tablet Take 10 mg by mouth daily as needed for allergies.     Multiple Vitamins-Minerals (BARIATRIC MULTIVITAMINS/IRON PO) Take 1 tablet by mouth in the morning and at bedtime.     ondansetron (ZOFRAN-ODT) 4 MG disintegrating tablet Dissolve 1 tablet by mouth every 8 hours as needed for nausea or vomiting. 20 tablet 0   Probiotic Product (PROBIOTIC ADVANCED PO) Take 1 capsule by mouth daily.      rosuvastatin (CRESTOR) 10 MG tablet Take 1 tablet by mouth daily. 90 tablet 1   SUMAtriptan (IMITREX) 50 MG tablet TAKE 1 TABLET BY MOUTH AT ONSET OF MIGRAINE, MAY REPEAT IN 2 HOURS IF HEADACHE  PERSISTS 10 tablet 0   SYRINGE-NEEDLE, DISP, 3 ML 25G X 1" 3 ML MISC Use to administer b12 injections 10 each 0   tirzepatide (MOUNJARO) 2.5 MG/0.5ML Pen Inject 2.5 mg into the skin once a week. 2 mL 0   tirzepatide (MOUNJARO) 5 MG/0.5ML Pen Inject '5mg'$  (0.93m) under the skin once weekly. 6 mL 0   Vitamin D, Ergocalciferol, (DRISDOL) 1.25 MG (50000 UNIT) CAPS capsule TAKE 1 CAPSULE BY MOUTH EVERY 7 DAYS 12 capsule 0   No current facility-administered medications for this visit.    Allergies:   Iohexol and Pyridium [phenazopyridine hcl]   Social History:  The patient  reports that she quit smoking about 5 years ago. Her smoking use included cigarettes. She has a 4.75 pack-year smoking history. She has never used smokeless tobacco. She reports current alcohol use. She reports that she does not use drugs.   Family History:  The patient's family history includes Alcohol abuse in her paternal grandfather; Breast cancer in her paternal aunt; Colon cancer in her maternal uncle; Dementia in her father; Depression in her mother; Diabetes in her father, maternal grandfather,  maternal grandmother, mother, paternal grandfather, and paternal grandmother; Heart disease in her father, maternal grandfather, maternal grandmother, mother, and paternal grandfather; Hyperlipidemia in her father; Hypertension in her father and mother; Obesity in her father and mother; Parkinson's disease in her father; Sleep apnea in her father; Stroke in her maternal grandfather and paternal grandfather; Thyroid disease in her father.  ROS:  Please see the history of present illness.    All other systems are reviewed and otherwise negative.   PHYSICAL EXAM:  VS:  BP 110/60   Pulse 65   Ht '5\' 8"'$  (1.727 m)   Wt 187 lb (84.8 kg)   SpO2 97%   BMI 28.43 kg/m  BMI: Body mass index is 28.43 kg/m. Well nourished, well developed, in no acute distress HEENT: normocephalic, atraumatic Neck: no JVD, carotid bruits or masses Cardiac:  RRR; no significant murmurs, no rubs, or gallops Lungs:  CTA b/l, no wheezing, rhonchi or rales Abd: soft, nontender MS: no deformity or atrophy Ext: no edema Skin: warm and dry, no rash Neuro:  No gross deficits appreciated Psych: euthymic mood, full affect  ILR site is stable, no tethering or discomfort   EKG:  Done today and reviewed by myself shows  SR, 65bpm, normal intervals  Device interrogation done today and reviewed by myself:  Battery is good R waves 0.20 Several symptom episodes Tachy episodes (5), available EGMs are noise/artifacts Pause episode (4),  appear to be signal loss    [place] Venous 08/26/2020: Summary:  Right:  - No evidence of deep vein thrombosis from the common femoral through the  popliteal veins.  - No evidence of superficial venous thrombosis.  - The common femoral vein is not competent.  - The great saphenous vein is not competent at the knee and proximal calf.  - The small saphenous vein is competent.     Left:  - No evidence of deep vein thrombosis from the common femoral through the  popliteal veins.  - No  evidence of superficial venous thrombosis.  - The deep venous system is not competent.  - The great saphenous vein is ablated proximally and competent distally.  - The small saphenous vein is not competent.   Echo 06/22/2020: 1. Left ventricular ejection fraction, by estimation, is 60 to 65%. The  left ventricle has normal function. The left ventricle has no  regional  wall motion abnormalities. There is mild left ventricular hypertrophy.  Left ventricular diastolic parameters  were normal.   2. Right ventricular systolic function is normal. The right ventricular  size is normal. Tricuspid regurgitation signal is inadequate for assessing  PA pressure.   3. The mitral valve is normal in structure. No evidence of mitral valve  regurgitation.   4. The aortic valve was not well visualized. Aortic valve regurgitation  is not visualized. No aortic stenosis is present.   5. The inferior vena cava is normal in size with greater than 50%  respiratory variability, suggesting right atrial pressure of 3 mmHg.    Monitor 11/23/2019: 12 days of data recorded on Zio monitor. Patient had a min HR of 37 bpm, max HR of 180 bpm, and avg HR of 67 bpm. Predominant underlying rhythm was Sinus Rhythm. No VT, SVT, atrial fibrillation, high degree block, or pauses noted. Isolated atrial and ventricular ectopy was rare (<1%). There were 91 triggered events. These were all sinus, with occasional isolate PAC or PVC. No significant arrhythmias detected.   Monitor 04/21/18 NSR Rare PAC/PVC s Symptoms do not correlate with arrhythmia Average HR 69 bpm   CT coronary 03/23/18 Coronary Arteries:  Normal coronary origin.  Right dominance.   RCA is a large dominant artery that gives rise to PDA and PLVB. There is no plaque.   Left main is a large artery that gives rise to LAD and LCX arteries. Left main has no plaque.   LAD is a large vessel that gives rise to two diagonal arteries. Proximal LAD has mild calcified  plaque with associated stenosis 0-25%. Mid LAD has a mild non-calcified plaque with associated stenosis 0-25%.   LCX is a small non-dominant artery that gives rise to one small OM1 branch. There is no plaque.   Other findings:   Normal pulmonary vein drainage into the left atrium.   Normal let atrial appendage without a thrombus.   IMPRESSION: 1. Coronary calcium score of 46. This was 68 percentile for age and sex matched control.   2. Normal coronary origin with right dominance.   3. 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 Aggressive medical management is recommended.   4. Mildly dilated pulmonary artery measuring 32 mm suggestive of pulmonary hypertension.  Recent Labs: 06/28/2021: ALT 11; BUN 17; Creatinine, Ser 0.92; Hemoglobin 14.0; Platelets 255; Potassium 4.3; Sodium 141; TSH 1.550  06/28/2021: Chol/HDL Ratio 3.3; Cholesterol, Total 153; HDL 47; LDL Chol Calc (NIH) 94; Triglycerides 59   CrCl cannot be calculated (Patient's most recent lab result is older than the maximum 21 days allowed.).   Wt Readings from Last 3 Encounters:  11/28/21 187 lb (84.8 kg)  10/04/21 190 lb (86.2 kg)  09/06/21 183 lb (83 kg)     Other studies reviewed: Additional studies/records reviewed today include: summarized above  ASSESSMENT AND PLAN:  Dizziness, weak spells Orthostatic Suspect perhaps component of autonomic dysfunction  2 available symptom episode EGMs available today both from today This AM was SR This afternoon is SR > ST   Orthostatics negative today with minimal but similar symptoms Advised ongoing hydration, compression wear, symptom recognition Discussed counter pressure techniques as well  Given her observation of slow HRs, now I made her brady detection to 50 and pause detection >3 seconds, will plan to have her back in a month or so,  Disposition: F/u as above  Current medicines are reviewed at length with the patient today.  The  patient did  not have any concerns regarding medicines.  Venetia Night, PA-C 11/28/2021 5:23 PM     [address] [place] [address] [phone] (office)  [phone] (fax)

## 2021-11-27 NOTE — Telephone Encounter (Signed)
Pt. Checking in

## 2021-11-28 ENCOUNTER — Ambulatory Visit (INDEPENDENT_AMBULATORY_CARE_PROVIDER_SITE_OTHER): Payer: No Typology Code available for payment source | Admitting: Physician Assistant

## 2021-11-28 ENCOUNTER — Encounter: Payer: Self-pay | Admitting: Physician Assistant

## 2021-11-28 VITALS — BP 110/60 | HR 65 | Ht 68.0 in | Wt 187.0 lb

## 2021-11-28 DIAGNOSIS — R001 Bradycardia, unspecified: Secondary | ICD-10-CM | POA: Diagnosis not present

## 2021-11-28 DIAGNOSIS — R55 Syncope and collapse: Secondary | ICD-10-CM

## 2021-11-28 DIAGNOSIS — Z4509 Encounter for adjustment and management of other cardiac device: Secondary | ICD-10-CM | POA: Diagnosis not present

## 2021-11-28 NOTE — Patient Instructions (Addendum)
Medication Instructions:  Your physician recommends that you continue on your current medications as directed. Please refer to the Current Medication list given to you today.   Labwork: None ordered.  Testing/Procedure   Follow-Up: Your physician wants you to follow-up in: ONE MONTH one of the following Advanced Practice Providers on your designated Care Team:   Tommye Standard, [place] Legrand Como "Jonni Sanger" Chalmers Cater, [place]   Any Other Special Instructions Will Be Listed Below (If Applicable).  If you need a refill on your cardiac medications before your next appointment, please call your pharmacy.   Important Information About Sugar

## 2021-11-30 ENCOUNTER — Ambulatory Visit (INDEPENDENT_AMBULATORY_CARE_PROVIDER_SITE_OTHER): Payer: No Typology Code available for payment source | Admitting: Student

## 2021-11-30 DIAGNOSIS — M793 Panniculitis, unspecified: Secondary | ICD-10-CM | POA: Diagnosis not present

## 2021-11-30 LAB — CUP PACEART REMOTE DEVICE CHECK
Date Time Interrogation Session: 20230726011837
Implantable Pulse Generator Implant Date: 20220216

## 2021-11-30 NOTE — Progress Notes (Signed)
Referring Provider Briscoe Deutscher, DO [address] [place],  [place] [address]   CC:  Chief Complaint  Patient presents with   Follow-up      [name] [name] is an 41 y.o. female.  HPI: Patient is a 41 year old female who underwent panniculectomy, abdominoplasty, and mons plasty with Dr. Marla Roe on 08/24/2021.  Patient presents for follow up.   Patient was last seen in the clinic on 10/17/2021.  At this visit, she was doing well.  On exam, her incisions were intact and healing well.  She did have some slight swelling noted to the pubic area.  Plan was to have patient continue with compression, and gradually increase her activities.  Patient was to follow-up in 3 to 4 weeks from that visit.  Today, patient reports she is doing well.  She states she is overall happy with her result.  Patient reports she has been back at work and doing well.  She states that she does sometimes get some minor swelling in the mons area.  Patient also has questions regarding some excess skin that she has near her mons and to her flanks.  She also is inquiring about the fold near the superior aspect of her abdomen.  Patient is asking if anything can be done about these areas.    Patient reports her incisions are healing well.  She states she can feel a small bump to the right lateral aspect of the abdominal incision.  Patient states she has been putting scar cream on the incision and feels like it has been helping.  Review of Systems General: No fevers, chills  Physical Exam    11/28/2021    2:27 PM 10/04/2021    8:22 AM 09/26/2021    8:39 AM  Vitals with BMI  Height '5\' 8"'$  '5\' 8"'$    Weight 187 lbs 190 lbs   BMI 40.10 27.2   Systolic [phone]  Diastolic 60 76 81  Pulse 65 69 68    General:  No acute distress,  Alert and oriented, Non-Toxic, Normal speech and affect  On exam, patient is sitting upright in no acute distress.  Abdomen is soft and nontender.  Incisions are intact and  healed.  There is a small bump that is consistent with a deep suture knot to the right lateral aspect of her incision.  There appears to be some scarred down tissue to the left lateral aspect of her incision.  Minimal swelling noted to the mons.   Assessment/Plan  Panniculitis   Discussed with the patient that the small bump to the right lateral aspect of her incision is most likely a suture.  Discussed with the patient that this should dissolve with time or that it may protrude out of the skin.  I discussed with the patient that if it does protrude out of the skin, to call us and we can cut it for her.  I discussed with the patient that she can gently massage the left lateral aspect of the incision.  Patient acknowledged  Discussed with the patient that she can continue compression as needed or for comfort.  I discussed with the patient that there may be some potential options for revision of the areas that she had questions about.  I discussed with the patient though that she will have to meet with Dr. Marla Roe to further assess what revisions can be done, if they are warranted.  Patient to follow-up with Dr. Marla Roe via televisit  next week.  Instructed patient to call if she has any other questions or concerns in the meantime.  Pictures were obtained of the patient and placed in the patient's chart with the patient's permission.  Clance Boll 11/30/2021, 2:45 PM

## 2021-12-04 ENCOUNTER — Telehealth: Payer: Self-pay

## 2021-12-04 ENCOUNTER — Ambulatory Visit (INDEPENDENT_AMBULATORY_CARE_PROVIDER_SITE_OTHER): Payer: No Typology Code available for payment source

## 2021-12-04 DIAGNOSIS — R55 Syncope and collapse: Secondary | ICD-10-CM | POA: Diagnosis not present

## 2021-12-04 DIAGNOSIS — R072 Precordial pain: Secondary | ICD-10-CM

## 2021-12-04 NOTE — Telephone Encounter (Signed)
Daily LINQ alerts received for brady since programmed 7/25 to 76 Brady events HR's 47-49 Route to triage for daily alerts, request decrease in HR brady alert.  Sending considering recent OV.

## 2021-12-04 NOTE — Telephone Encounter (Signed)
Patient reports of lightheaded, dizziness and "felt like my heart was racing." Reports of chest pressure, although she states she has anxiety and has had a stressful few days at work.

## 2021-12-05 ENCOUNTER — Ambulatory Visit (INDEPENDENT_AMBULATORY_CARE_PROVIDER_SITE_OTHER): Payer: No Typology Code available for payment source | Admitting: Plastic Surgery

## 2021-12-05 ENCOUNTER — Other Ambulatory Visit (HOSPITAL_COMMUNITY): Payer: Self-pay

## 2021-12-05 ENCOUNTER — Encounter: Payer: Self-pay | Admitting: Plastic Surgery

## 2021-12-05 DIAGNOSIS — M793 Panniculitis, unspecified: Secondary | ICD-10-CM | POA: Diagnosis not present

## 2021-12-05 MED ORDER — PREDNISONE 50 MG PO TABS
ORAL_TABLET | ORAL | 0 refills | Status: DC
  Filled 2021-12-05: qty 3, 1d supply, fill #0

## 2021-12-05 MED ORDER — METOPROLOL TARTRATE 50 MG PO TABS
ORAL_TABLET | ORAL | 0 refills | Status: DC
  Filled 2021-12-05: qty 1, 1d supply, fill #0

## 2021-12-05 NOTE — Telephone Encounter (Signed)
RN returned call to patient and discussed the recommendations below, patient to get her labs drawn. Patient does still endorse exertional chest pain, CT ordered. Patient informed that scheduling team will reach out to her.        "Appreciate the update.   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 At clinic 09/2021 her statin was resumed (she had self-discontinued as she did not realize why she was taking it). Would have low suspicion that her mild CAD has progressed enough to cause symptoms.   Will ask Stepen Prins to call her to reassess symptoms and remind her repeat cholesterol labs collected. If she is having exertional chest discomfort (I.e. with exercise or walking at work) can repeat cardiac CTA. If symptoms not exertional, anxiety likely etiology.   Loel Dubonnet, NP "

## 2021-12-05 NOTE — Addendum Note (Signed)
Addended by: Gerald Stabs on: 12/05/2021 09:53 AM   Modules accepted: Orders

## 2021-12-05 NOTE — Progress Notes (Signed)
   Subjective:    [name], female    DOB: 1980-05-10, 41 y.o.   MRN: [id number]  The patient is a 41 year old female who had a panniculectomy in April.  She says that she notices that the midline lower abdominal area is flat in the morning but gets a little swollen as the day goes by.  She thinks that it is because she is on her feet moving a lot she thinks that she has a little hanging and a little bit of a bulge.  She would like to see if that will improve in time or if more work needs to be done.      Review of Systems  Constitutional: Negative.   Eyes: Negative.   Respiratory: Negative.    Cardiovascular: Negative.   Gastrointestinal: Negative.   Endocrine: Negative.   Genitourinary: Negative.        Objective:   Physical Exam        Assessment & Plan:     ICD-10-CM   1. Panniculitis  M79.3       Follow-up in 2 to 3 months.  I connected with  [name] [name] on 12/05/21 by a phone and verified that I am speaking with the correct person using two identifiers.  We spent 5 minutes in discussion.  The patient was at home and I was at the office.   I discussed the limitations of evaluation and management by telemedicine. The patient expressed understanding and agreed to proceed.

## 2021-12-06 ENCOUNTER — Ambulatory Visit (INDEPENDENT_AMBULATORY_CARE_PROVIDER_SITE_OTHER): Payer: No Typology Code available for payment source | Admitting: Adult Health

## 2021-12-06 ENCOUNTER — Encounter (INDEPENDENT_AMBULATORY_CARE_PROVIDER_SITE_OTHER): Payer: Self-pay | Admitting: Adult Health

## 2021-12-06 ENCOUNTER — Other Ambulatory Visit (INDEPENDENT_AMBULATORY_CARE_PROVIDER_SITE_OTHER): Payer: Self-pay | Admitting: Adult Health

## 2021-12-06 ENCOUNTER — Other Ambulatory Visit (HOSPITAL_COMMUNITY): Payer: Self-pay

## 2021-12-06 VITALS — BP 101/67 | HR 66 | Temp 98.4°F | Ht 68.0 in | Wt 186.0 lb

## 2021-12-06 DIAGNOSIS — E1169 Type 2 diabetes mellitus with other specified complication: Secondary | ICD-10-CM

## 2021-12-06 DIAGNOSIS — E669 Obesity, unspecified: Secondary | ICD-10-CM | POA: Diagnosis not present

## 2021-12-06 DIAGNOSIS — E785 Hyperlipidemia, unspecified: Secondary | ICD-10-CM

## 2021-12-06 DIAGNOSIS — Z7985 Long-term (current) use of injectable non-insulin antidiabetic drugs: Secondary | ICD-10-CM

## 2021-12-06 DIAGNOSIS — E119 Type 2 diabetes mellitus without complications: Secondary | ICD-10-CM

## 2021-12-06 DIAGNOSIS — Z6828 Body mass index (BMI) 28.0-28.9, adult: Secondary | ICD-10-CM

## 2021-12-06 DIAGNOSIS — E559 Vitamin D deficiency, unspecified: Secondary | ICD-10-CM

## 2021-12-06 DIAGNOSIS — Z6834 Body mass index (BMI) 34.0-34.9, adult: Secondary | ICD-10-CM

## 2021-12-06 MED ORDER — TIRZEPATIDE 2.5 MG/0.5ML ~~LOC~~ SOAJ
2.5000 mg | SUBCUTANEOUS | 0 refills | Status: DC
  Filled 2021-12-06: qty 2, 28d supply, fill #0
  Filled 2021-12-06: qty 6, 84d supply, fill #0
  Filled 2021-12-06: qty 2, 28d supply, fill #0
  Filled 2022-01-07: qty 4, 56d supply, fill #1

## 2021-12-06 MED ORDER — TIRZEPATIDE 2.5 MG/0.5ML ~~LOC~~ SOAJ
2.5000 mg | SUBCUTANEOUS | 0 refills | Status: DC
  Filled 2021-12-06: qty 2, 28d supply, fill #0

## 2021-12-06 NOTE — Telephone Encounter (Signed)
Please advise 

## 2021-12-07 ENCOUNTER — Ambulatory Visit (INDEPENDENT_AMBULATORY_CARE_PROVIDER_SITE_OTHER): Payer: No Typology Code available for payment source | Admitting: Adult Health

## 2021-12-07 ENCOUNTER — Other Ambulatory Visit (HOSPITAL_COMMUNITY): Payer: Self-pay

## 2021-12-07 LAB — HEMOGLOBIN A1C
Est. average glucose Bld gHb Est-mCnc: 117 mg/dL
Hgb A1c MFr Bld: 5.7 % — ABNORMAL HIGH (ref 4.8–5.6)

## 2021-12-07 LAB — VITAMIN D 25 HYDROXY (VIT D DEFICIENCY, FRACTURES): Vit D, 25-Hydroxy: 58.7 ng/mL (ref 30.0–100.0)

## 2021-12-08 ENCOUNTER — Other Ambulatory Visit (HOSPITAL_COMMUNITY): Payer: Self-pay

## 2021-12-12 ENCOUNTER — Other Ambulatory Visit (HOSPITAL_COMMUNITY): Payer: Self-pay

## 2021-12-12 ENCOUNTER — Other Ambulatory Visit (INDEPENDENT_AMBULATORY_CARE_PROVIDER_SITE_OTHER): Payer: Self-pay | Admitting: Adult Health

## 2021-12-12 ENCOUNTER — Encounter (INDEPENDENT_AMBULATORY_CARE_PROVIDER_SITE_OTHER): Payer: Self-pay | Admitting: Adult Health

## 2021-12-12 DIAGNOSIS — E559 Vitamin D deficiency, unspecified: Secondary | ICD-10-CM

## 2021-12-12 MED ORDER — VITAMIN D (ERGOCALCIFEROL) 1.25 MG (50000 UNIT) PO CAPS
ORAL_CAPSULE | ORAL | 0 refills | Status: DC
  Filled 2021-12-12 – 2022-01-07 (×2): qty 8, 56d supply, fill #0

## 2021-12-13 ENCOUNTER — Encounter (INDEPENDENT_AMBULATORY_CARE_PROVIDER_SITE_OTHER): Payer: Self-pay

## 2021-12-13 NOTE — Progress Notes (Unsigned)
Chief Complaint:   OBESITY [name] [name] is here to discuss her progress with her obesity treatment plan along with follow-up of her obesity related diagnoses. [name] is on the Category 1 Plan and states she is following her eating plan approximately 80% of the time. [name] states she is walking/elliptical 30 minutes 4 times per week.  Today's visit was #: 21 Starting weight: 243 lbs Starting date: 06/18/2019 Today's weight: 186 lbs Today's date: 12/06/2021 Total lbs lost to date: 57 lbs Total lbs lost since last in-office visit: 0  Interim History: On 08/24/21 [name] [name] had panniculitis treated with abdominoplasty ***. She followed up with Dr. Marla Roe***  on 12/05/21--***. Follow up in 2-3 months.  Subjective:   1. Hyperlipidemia associated with type 2 diabetes mellitus ([place]) On 10/04/21: Cardiologist started [name] [name] on Crestor 10 mg. She denies myalgias.  2. Vitamin D deficiency On 06/28/21 [name] [name]'s Vit D level of 83.7. Dr. Juleen China provides Ergocalciferol.  3. Type 2 diabetes mellitus without complication, without long-term current use of insulin ([facility]) The max dose of Mounjaro 5 mg, caused [name] [name] to have a GI upset after abdominoplasty so it was decreased to 2.5 mg weekly ***. Her fasting blood sugars are 80-90 ***  Assessment/Plan:   1. Hyperlipidemia associated with type 2 diabetes mellitus ([place]) [name] [name] will continue statin therapy per cardiologist.  2. Vitamin D deficiency We will obtain labs today.  - VITAMIN D 25 Hydroxy (Vit-D Deficiency, Fractures)  3. Type 2 diabetes mellitus without complication, without long-term current use of insulin ([facility]) We will refill Mounjaro 2.5 mg SubQ once weekly for 1 month with 0 refills.  -Refill tirzepatide (MOUNJARO) 2.5 MG/0.5ML Pen; Inject 2.5 mg into the skin once a week.  Dispense: 6 mL; Refill: 0 - Hemoglobin A1c  4. Obesity, current BMI 28.3 [name] is currently in the action stage of change. As such, her goal is to  continue with weight loss efforts. She has agreed to the Category 1 Plan.   Exercise goals: As is.  Behavioral modification strategies: increasing lean protein intake, decreasing simple carbohydrates, meal planning and cooking strategies, keeping healthy foods in the home, and planning for success.  [name] [name] has agreed to follow-up with our clinic in 12 weeks. She was informed of the importance of frequent follow-up visits to maximize her success with intensive lifestyle modifications for her multiple health conditions.   [name] [name] was informed we would discuss her lab results at her next visit unless there is a critical issue that needs to be addressed sooner. [name] [name] agreed to keep her next visit at the agreed upon time to discuss these results.  Objective:   Blood pressure 101/67, pulse 66, temperature 98.4 F (36.9 C), height '5\' 8"'$  (1.727 m), weight 186 lb (84.4 kg), SpO2 100 %. Body mass index is 28.28 kg/m.  General: Cooperative, alert, well developed, in no acute distress. HEENT: Conjunctivae and lids unremarkable. Cardiovascular: Regular rhythm.  Lungs: Normal work of breathing. Neurologic: No focal deficits.   Lab Results  Component Value Date   CREATININE 0.92 06/28/2021   BUN 17 06/28/2021   NA 141 06/28/2021   K 4.3 06/28/2021   CL 102 06/28/2021   CO2 24 06/28/2021   Lab Results  Component Value Date   ALT 11 06/28/2021   AST 13 06/28/2021   ALKPHOS 59 06/28/2021   BILITOT 0.5 06/28/2021   Lab Results  Component Value Date   HGBA1C 5.7 (H) 12/06/2021   HGBA1C 5.2 06/28/2021   HGBA1C 5.7 (H) 09/23/2020  HGBA1C 5.8 (H) 08/18/2020   HGBA1C 5.6 10/14/2019   Lab Results  Component Value Date   INSULIN 11.0 08/18/2020   INSULIN 11.9 10/14/2019   INSULIN 10.1 06/18/2019   Lab Results  Component Value Date   TSH 1.550 06/28/2021   Lab Results  Component Value Date   CHOL 153 06/28/2021   HDL 47 06/28/2021   LDLCALC 94 06/28/2021   LDLDIRECT 141 (H)  09/23/2020   TRIG 59 06/28/2021   CHOLHDL 3.3 06/28/2021   Lab Results  Component Value Date   VD25OH 58.7 12/06/2021   VD25OH 83.7 06/28/2021   VD25OH 48.6 08/18/2020   Lab Results  Component Value Date   WBC 8.0 06/28/2021   HGB 14.0 06/28/2021   HCT 42.6 07/05/2021   MCV 89 06/28/2021   PLT 255 06/28/2021   Lab Results  Component Value Date   IRON 69 07/05/2021   TIBC 267 07/05/2021   FERRITIN 65 07/05/2021   Attestation Statements:   Reviewed by clinician on day of visit: allergies, medications, problem list, medical history, surgical history, family history, social history, and previous encounter notes.  I, Bruna Dills, RMA, am acting as transcriptionist for [name], NP.  I have reviewed the above documentation for accuracy and completeness, and I agree with the above. -  ***

## 2021-12-18 ENCOUNTER — Ambulatory Visit (INDEPENDENT_AMBULATORY_CARE_PROVIDER_SITE_OTHER): Payer: No Typology Code available for payment source | Admitting: Obstetrics & Gynecology

## 2021-12-18 ENCOUNTER — Telehealth: Payer: Self-pay | Admitting: Cardiology

## 2021-12-18 ENCOUNTER — Encounter: Payer: Self-pay | Admitting: Obstetrics & Gynecology

## 2021-12-18 VITALS — BP 122/80

## 2021-12-18 DIAGNOSIS — N898 Other specified noninflammatory disorders of vagina: Secondary | ICD-10-CM

## 2021-12-18 DIAGNOSIS — Z113 Encounter for screening for infections with a predominantly sexual mode of transmission: Secondary | ICD-10-CM

## 2021-12-18 DIAGNOSIS — R300 Dysuria: Secondary | ICD-10-CM | POA: Diagnosis not present

## 2021-12-18 LAB — WET PREP FOR TRICH, YEAST, CLUE

## 2021-12-18 NOTE — Telephone Encounter (Signed)
Pt c/o medication issue:  1. Name of Medication:   metoprolol tartrate (LOPRESSOR) 50 MG tablet    2. How are you currently taking this medication (dosage and times per day)? Please take 1 tablet 2 hours prior to CT  3. Are you having a reaction (difficulty breathing--STAT)? NO  4. What is your medication issue? Pt states that medication has a interaction with another medication that she has to take prior to CT on 12/25/21. Pt would like a callback regarding this matter. Please advise

## 2021-12-18 NOTE — Progress Notes (Signed)
    Lejla Moeser [facility] Sep 29, 1980 [id number]        41 y.o.  [id number] Single  RP: Vaginal discomfort with discharge  HPI: Vaginal discomfort with discharge, not improved post Monistat.  New partner x a couple of months.  Some pain at the end of urination.  No fever.  Contraception with Mirena IUD x 07/07/21.   OB History  Gravida Para Term Preterm AB Living  '3 1 1   2 1  '$ SAB IAB Ectopic Multiple Live Births  2       1    # Outcome Date GA Lbr Len/2nd Weight Sex Delivery Anes PTL Lv  3 SAB           2 SAB           1 Term      Vag-Spont   LIV    Past medical history,surgical history, problem list, medications, allergies, family history and social history were all reviewed and documented in the EPIC chart.   Directed ROS with pertinent positives and negatives documented in the history of present illness/assessment and plan.  Exam:  Vitals:   12/18/21 1214  BP: 122/80   General appearance:  Normal  Abdomen: Normal  Gynecologic exam: Vulva normal.  Speculum:  Cervix/vagina normal.  Gono-Chlam done.  Increased, bubbly discharge.  Wet prep done.  Wet Prep:  Negative for Clue cells, Yeast, Trichomonas.  Many spermatozoids present. U/A: Yellow slightly cloudy, Nit Neg, WBC 6-10, RBC 0-2, Bacteria Few.  Pending U. Culture.   Assessment/Plan:  41 y.o. G3P1021   1. Vaginal discharge  Vaginal discomfort with discharge, not improved post Monistat.  New partner x a couple of months.  Some pain at the end of urination.  No fever.  Contraception with Mirena IUD x 07/07/21.  Wet prep Negative, but many spermatozoids present.  Pending Gono-Chlam.  May use Hydrocortisone 1% cream as needed for some irritative inflammation.   - WET PREP FOR TRICH, YEAST, CLUE  2. Screen for STD (sexually transmitted disease) - HIV antibody (with reflex) - RPR - Hepatitis B Surface AntiGEN - Hepatitis C Antibody - C. trachomatis/N. gonorrhoeae RNA  3. Dysuria U/A just mildly perturbed.  Will wait on U.  Culture to decide on the indication to treat. - Urinalysis,Complete w/RFL Culture  Other orders - Urine Culture - REFLEXIVE URINE CULTURE   Princess Bruins MD, 12:36 PM 12/18/2021

## 2021-12-18 NOTE — Telephone Encounter (Signed)
12/18/2021 Left message for patient to call back. Georgana Curio MHA RN CCM  Immediate call back received from patient after discussing concern raised by pharmacist of taking Benadryl and Metoprolol together discussed with Dr. Harrell Gave. Per Dr. Harrell Gave there is no contraindication for patient to take both benadryl and metoprolol in preparation for upcoming CT scan.  Pt with history of bradycardia concerned if on the day of CT her heart rate is too slow. Writer explained pt should take her pulse, if her heart rate is less than 60 she should hold the metoprolol, 60 or greater she can take the metoprolol.  CT scan cannot be completed if her heart rate is too fast. Pt. verbalized understanding. Georgana Curio RN CCM MHA

## 2021-12-19 LAB — C. TRACHOMATIS/N. GONORRHOEAE RNA
C. trachomatis RNA, TMA: NOT DETECTED
N. gonorrhoeae RNA, TMA: NOT DETECTED

## 2021-12-19 LAB — HIV ANTIBODY (ROUTINE TESTING W REFLEX): HIV 1&2 Ab, 4th Generation: NONREACTIVE

## 2021-12-19 LAB — RPR: RPR Ser Ql: NONREACTIVE

## 2021-12-19 LAB — HEPATITIS C ANTIBODY: Hepatitis C Ab: NONREACTIVE

## 2021-12-19 LAB — HEPATITIS B SURFACE ANTIGEN: Hepatitis B Surface Ag: NONREACTIVE

## 2021-12-20 LAB — URINALYSIS, COMPLETE W/RFL CULTURE
Bilirubin Urine: NEGATIVE
Casts: NONE SEEN /LPF
Crystals: NONE SEEN /HPF
Glucose, UA: NEGATIVE
Hyaline Cast: NONE SEEN /LPF
Ketones, ur: NEGATIVE
Nitrites, Initial: NEGATIVE
Protein, ur: NEGATIVE
Specific Gravity, Urine: 1.025 (ref 1.001–1.035)
Yeast: NONE SEEN /HPF
pH: 5 (ref 5.0–8.0)

## 2021-12-20 LAB — CBC
Hematocrit: 39.6 % (ref 34.0–46.6)
Hemoglobin: 12.9 g/dL (ref 11.1–15.9)
MCH: 26.4 pg — ABNORMAL LOW (ref 26.6–33.0)
MCHC: 32.6 g/dL (ref 31.5–35.7)
MCV: 81 fL (ref 79–97)
Platelets: 270 10*3/uL (ref 150–450)
RBC: 4.88 x10E6/uL (ref 3.77–5.28)
RDW: 15.3 % (ref 11.7–15.4)
WBC: 8.4 10*3/uL (ref 3.4–10.8)

## 2021-12-20 LAB — URINE CULTURE
MICRO NUMBER:: 13774851
SPECIMEN QUALITY:: ADEQUATE

## 2021-12-20 LAB — BASIC METABOLIC PANEL
BUN/Creatinine Ratio: 20 (ref 9–23)
BUN: 17 mg/dL (ref 6–24)
CO2: 21 mmol/L (ref 20–29)
Calcium: 9.3 mg/dL (ref 8.7–10.2)
Chloride: 104 mmol/L (ref 96–106)
Creatinine, Ser: 0.86 mg/dL (ref 0.57–1.00)
Glucose: 83 mg/dL (ref 70–99)
Potassium: 4.7 mmol/L (ref 3.5–5.2)
Sodium: 142 mmol/L (ref 134–144)
eGFR: 87 mL/min/{1.73_m2} (ref >59)

## 2021-12-20 LAB — CULTURE INDICATED

## 2021-12-21 ENCOUNTER — Ambulatory Visit (HOSPITAL_BASED_OUTPATIENT_CLINIC_OR_DEPARTMENT_OTHER): Payer: No Typology Code available for payment source | Admitting: Cardiology

## 2021-12-22 ENCOUNTER — Telehealth (HOSPITAL_COMMUNITY): Payer: Self-pay | Admitting: *Deleted

## 2021-12-22 NOTE — Telephone Encounter (Signed)
Reaching out to patient to offer assistance regarding upcoming cardiac imaging study; pt verbalizes understanding of appt date/time, parking situation and where to check in, pre-test NPO status and medications ordered, and verified current allergies; name and call back number provided for further questions should they arise  Gordy Clement RN Navigator Cardiac [place] [facility] [phone] office [phone] cell  Reviewed with patient how to take 13 hour prep and metoprolol tartrate. Patient has a pulse Ox at home and will take '25mg'$  metoprolol if HR is greater than 65bpm and take '50mg'$  if HR is greater than 70bpm. Patient verbalized understanding.

## 2021-12-25 ENCOUNTER — Ambulatory Visit (HOSPITAL_COMMUNITY)
Admission: RE | Admit: 2021-12-25 | Discharge: 2021-12-25 | Disposition: A | Discharge status: Home / Self Care | Payer: No Typology Code available for payment source | Source: Ambulatory Visit | Attending: Family | Admitting: Family

## 2021-12-25 DIAGNOSIS — R072 Precordial pain: Secondary | ICD-10-CM | POA: Insufficient documentation

## 2021-12-25 MED ORDER — NITROGLYCERIN 0.4 MG SL SUBL
0.8000 mg | SUBLINGUAL_TABLET | Freq: Once | SUBLINGUAL | Status: AC
  Administered 2021-12-25: 0.8 mg via SUBLINGUAL

## 2021-12-25 MED ORDER — NITROGLYCERIN 0.4 MG SL SUBL
SUBLINGUAL_TABLET | SUBLINGUAL | Status: AC
  Filled 2021-12-25: qty 2

## 2021-12-25 MED ORDER — IOHEXOL 350 MG/ML SOLN
100.0000 mL | Freq: Once | INTRAVENOUS | Status: AC | PRN
  Administered 2021-12-25: 100 mL via INTRAVENOUS

## 2021-12-26 ENCOUNTER — Encounter (HOSPITAL_BASED_OUTPATIENT_CLINIC_OR_DEPARTMENT_OTHER): Payer: Self-pay

## 2021-12-26 ENCOUNTER — Telehealth: Payer: Self-pay | Admitting: Physician Assistant

## 2021-12-26 NOTE — Telephone Encounter (Signed)
Patient has had a coronary CT yesterday, prior to the coronary CT, patient was premedicated for contrast allergy with 3 doses of prednisone.  This morning, patient started having redness and itchiness around the eye and face.  She denies any respiratory compromise.  She does not have any rash on her body.  I recommended Benadryl for now.  She need a earlier visit with her PCP for evaluation.  If the symptom start to involve respiratory issues or if she has major skin breakdown, that would constitute a medical emergency and that she would have to go to the emergency room.  Otherwise we will try to manage this as outpatient.

## 2021-12-27 NOTE — Progress Notes (Signed)
Cardiology Office Note:    Date:  12/29/2021   ID:  [name] [name], DOB 08-23-1980, MRN [id number]  PCP:  Briscoe Deutscher, DO  Cardiologist:  Buford Dresser, MD  CC: follow up  History of Present Illness:    [name] [name] is a 41 y.o. female with a hx of chronic dizziness, PCOS, prior bariatric surgery, former tobacco use, personal history of Covid infection who is seen for follow up.   At her last appointment she continued to struggle with episodes of dizziness and lightheadedness. During these episodes she suffered from severe fatigue for the rest of the day. She noted increased life stressors which she felt contributed to her symptoms. She did not have improvement on florinef or midodrine, so she discontinued both.  She was most recently seen in cardiology by Tommye Standard, PA-C on 11/28/2021. She reported an up tick in symptoms and had heart rates as low as 38-40 bpm per her pulse Ox. She had described her episodes as starting with ringing in her ears, vision starts to get tunneled, feels weak, lightheaded and then has to get to seated or supine, feels a bit shaky after wards. Given her observation of slow HRs, device changes were made: brady detection to 50 and pause detection >3 seconds.  She had a coronary CT 12/25/2021, and had been premedicated for contrast allergy with 3 doses of prednisone. The next day she started having redness and pruritis around her eyes and face. It was recommended to take Benadryl and follow up with her PCP.  Today: She presents today to discuss the results of her coronary CT from 12/25/2021. Her coronary artery calcium score was 67.6 Agatston units (99th percentile). Also showed calcified plaque proximal LAD with mild (1-24%) stenosis and mixed plaque mid RCA, mild (1-24%) stenosis.  She continues to suffer from her ongoing episodes of dizziness/lightheadedness. They will occur sporadically. She may go for weeks without any episodes. Last month she had 6  episodes. Of note, she had noticed that month was particularly stressful with multiple life stressors. In the past 2 weeks she had 1 episode, associated with a drop in blood pressure (99/62). After 5 minutes she began to feel better. She believes her episodes may be related to her recent weight loss, as they have been more frequent overall. Currently in clinic she weighs 186 lbs. She underwent panniculectomy, abdominoplasty and liposuction in 08/2021 without any issues.   Occasionally she also wakes up in the middle of the night with racing palpitations. Per her pulse Ox her heart rate was in the 200's. She also feels jittery at those times.  Due to the heat she is not exercising as intensely as she would like. She will use the elliptical for 15 minutes a day, 7 days a week. With the cooler weather she will go walking for 3.5 to 4 miles or up to an hour.  She is taking klonopin for anxiety and panic attacks.  She denies any chest pain, shortness of breath, or peripheral edema. No headaches, orthopnea, or PND.   Past Medical History:  Diagnosis Date   Abnormal uterine bleeding    Amenorrhea    Anemia    Anxiety    panic attacks   Back pain    Bradycardia    Chest pain    COVID 02/2020   Diabetes ([place])    h/o   Dysmenorrhea    Endometriosis    Gallbladder problem    GERD (gastroesophageal reflux disease)  occasional - diet controlled and tums prn   HLD (hyperlipidemia)    HTN (hypertension)    IBS (irritable bowel syndrome)    Infertility, female    Insulin resistance    Migraine without aura    Missed abortion    x 2 - both resolved without surgery   Obesity    PCOS (polycystic ovarian syndrome)    PONV (postoperative nausea and vomiting)    pe rpatient ; scopalamine patch very helpful    SVD (spontaneous vaginal delivery)    x 1   Syncope and collapse 06/21/2020   Urinary incontinence    Vitamin D deficiency     Past Surgical History:  Procedure Laterality Date    ABDOMINOPLASTY/PANNICULECTOMY WITH LIPOSUCTION N/A 08/24/2021   Procedure: ABDOMINOPLASTY/PANNICULECTOMY WITH LIPOSUCTION, LIPOSUCTION OF MONS PUBIS;  Surgeon: Wallace Going, DO;  Location: [facility];  Service: Plastics;  Laterality: N/A;   CHOLECYSTECTOMY     COLONOSCOPY  2017   polyps   CYSTOSCOPY N/A 10/29/2016   Procedure: CYSTOSCOPY;  Surgeon: Salvadore Dom, MD;  Location: [facility];  Service: Gynecology;  Laterality: N/A;   DILATATION & CURRETTAGE/HYSTEROSCOPY WITH RESECTOCOPE N/A 10/17/2012   Procedure: DILATATION & CURETTAGE/HYSTEROSCOPY WITH RESECTOCOPE; Possible Polypectomy, Possible Resectoscopic Myomectomy.;  Surgeon: Floyce Stakes. Pamala Hurry, MD;  Location: [facility];  Service: Gynecology;  Laterality: N/A;  1 hr.   DILATION AND CURETTAGE OF UTERUS     ENDOMETRIAL BIOPSY     ENDOVENOUS ABLATION SAPHENOUS VEIN W/ LASER Right 05/08/2018   endovenous laser ablation right greater saphenous vein and stab phlebectomy > 20 incisions right leg by Deitra Mayo MD    ESOPHAGOGASTRODUODENOSCOPY ENDOSCOPY  2017   Normal   HYSTEROSCOPY WITH D & C N/A 12/01/2013   Procedure: IUD Removal;  Surgeon: Floyce Stakes. Pamala Hurry, MD;  Location: [facility];  Service: Gynecology;  Laterality: N/A;  90 min.   INTRAUTERINE DEVICE (IUD) INSERTION N/A 10/17/2012   Procedure: INTRAUTERINE DEVICE (IUD) INSERTION; Mirena;  Surgeon: Floyce Stakes. Pamala Hurry, MD;  Location: [facility];  Service: Gynecology;  Laterality: N/A;   LAPAROSCOPIC GASTRIC SLEEVE RESECTION N/A 02/19/2017   Procedure: LAPAROSCOPIC GASTRIC SLEEVE RESECTION, UPPER ENDOSCOPY;  Surgeon: Johnathan Hausen, MD;  Location: [facility];  Service: General;  Laterality: N/A;   LAPAROSCOPIC SALPINGO OOPHERECTOMY Left 10/29/2016   Procedure: LAPAROSCOPIC SALPINGO OOPHORECTOMY With anterior uterine biopsy;  Surgeon: Salvadore Dom, MD;  Location: [facility];  Service: Gynecology;  Laterality: Left;   LASIK  10/06/2018   LOOP RECORDER INSERTION N/A 06/22/2020    Procedure: LOOP RECORDER INSERTION;  Surgeon: Evans Lance, MD;  Location: [facility];  Service: Cardiovascular;  Laterality: N/A;   MOUTH SURGERY     wisdom teeth    Current Medications: Current Outpatient Medications on File Prior to Visit  Medication Sig   APPLE CIDER VINEGAR PO Take 1 tablet by mouth daily.   aspirin EC 81 MG tablet Take 81 mg by mouth every other day. Swallow whole.   Biotin w/ Vitamins C & E (HAIR/SKIN/NAILS PO) Take 1 tablet by mouth daily.    clonazePAM (KLONOPIN) 0.5 MG tablet Take 1 tablet by mouth 2  times daily as needed for anxiety.   cyanocobalamin (,VITAMIN B-12,) 1000 MCG/ML injection Inject 1 ml once every other week   levonorgestrel (MIRENA) 20 MCG/24HR IUD 1 each by Intrauterine route once.   loratadine (CLARITIN) 10 MG tablet Take 10 mg by mouth daily as needed for allergies.   metoprolol tartrate (  LOPRESSOR) 50 MG tablet Please take 1 tablet 2 hours prior to CT   Multiple Vitamins-Minerals (BARIATRIC MULTIVITAMINS/IRON PO) Take 1 tablet by mouth in the morning and at bedtime.   ondansetron (ZOFRAN-ODT) 4 MG disintegrating tablet Dissolve 1 tablet by mouth every 8 hours as needed for nausea or vomiting.   predniSONE (DELTASONE) 50 MG tablet Take 1 tablet by mouth 13 hours before CT, 7 hours before CT, and 1 hour before contrast injection.   Probiotic Product (PROBIOTIC ADVANCED PO) Take 1 capsule by mouth daily.    rosuvastatin (CRESTOR) 10 MG tablet Take 1 tablet by mouth daily.   SUMAtriptan (IMITREX) 50 MG tablet TAKE 1 TABLET BY MOUTH AT ONSET OF MIGRAINE, MAY REPEAT IN 2 HOURS IF HEADACHE PERSISTS   SYRINGE-NEEDLE, DISP, 3 ML 25G X 1" 3 ML MISC Use to administer b12 injections   tirzepatide (MOUNJARO) 2.5 MG/0.5ML Pen Inject 2.5 mg into the skin once a week.   Vitamin D, Ergocalciferol, (DRISDOL) 1.25 MG (50000 UNIT) CAPS capsule TAKE 1 CAPSULE BY MOUTH EVERY 7 DAYS   No current facility-administered medications on file prior to visit.      Allergies:   Iohexol and Pyridium [phenazopyridine hcl]   Social History   Tobacco Use   Smoking status: Former    Packs/day: 0.25    Years: 19.00    Total pack years: 4.75    Types: Cigarettes    Quit date: 06/06/2016    Years since quitting: 5.5   Smokeless tobacco: Never  Vaping Use   Vaping Use: Never used  Substance Use Topics   Alcohol use: Yes    Comment: 1 a month   Drug use: No    Family History: family history includes Alcohol abuse in her paternal grandfather; Breast cancer in her paternal aunt; Colon cancer in her maternal uncle; Dementia in her father; Depression in her mother; Diabetes in her father, maternal grandfather, maternal grandmother, mother, paternal grandfather, and paternal grandmother; Heart disease in her father, maternal grandfather, maternal grandmother, mother, and paternal grandfather; Hyperlipidemia in her father; Hypertension in her father and mother; Obesity in her father and mother; Parkinson's disease in her father; Sleep apnea in her father; Stroke in her maternal grandfather and paternal grandfather; Thyroid disease in her father. Family history: multiple family members with heart attacks and stents. Mat Gpa had bypass surgery and a pacemaker. Father has heart issues, had cath recently but no stent. Mother has mitral valve prolapse and palpitations. Gena Fray, paternal great-gpa, and uncle all had MI.   ROS:   Please see the history of present illness.   (+) Lightheadedness/Dizziness (+) Palpitations (+) Anxiety (+) Panic attacks Additional pertinent ROS otherwise unremarkable.  EKGs/Labs/Other Studies Reviewed:    The following studies were reviewed today:  Coronary CT  12/25/2021: FINDINGS: Non-cardiac: See separate report from [facility].   No LA appendage thrombus. Pulmonary veins drain normally to the left atrium.   Calcium Score: 67.6 Agatston units.   Coronary Arteries: Right dominant with no anomalies   LM: No  plaque or stenosis.   LAD system: Calcified plaque proximal LAD with mild (1-24%) stenosis.   Circumflex system: No plaque or stenosis.   RCA system: Mixed plaque mid RCA, mild (1-24%) stenosis.   IMPRESSION: 1. Coronary artery calcium score 67.6 Agatston units. This places the patient in the 99th percentile for age and gender, suggesting high risk for future cardiac events.   2.  Mild nonobstructive CAD.  Bilateral Carotid Dopplers  03/16/2021:  Summary:  Right Carotid: There was no evidence of thrombus, dissection,  atherosclerotic                 plaque or stenosis in the cervical carotid system.   Left Carotid: There was no evidence of thrombus, dissection,  atherosclerotic                plaque or stenosis in the cervical carotid system.   Vertebrals:  Bilateral vertebral arteries demonstrate antegrade flow.  Subclavians: Normal flow hemodynamics were seen in bilateral subclavian arteries.   Echocardiogram  06/22/2020: Sonographer Comments: Technically difficult study due to poor echo  windows. Image acquisition challenging due to patient body habitus.  Patient has fresh loop recorder implant in parasternal region. Off axis  views.  IMPRESSIONS    1. Left ventricular ejection fraction, by estimation, is 60 to 65%. The  left ventricle has normal function. The left ventricle has no regional  wall motion abnormalities. There is mild left ventricular hypertrophy.  Left ventricular diastolic parameters  were normal.   2. Right ventricular systolic function is normal. The right ventricular  size is normal. Tricuspid regurgitation signal is inadequate for assessing  PA pressure.   3. The mitral valve is normal in structure. No evidence of mitral valve  regurgitation.   4. The aortic valve was not well visualized. Aortic valve regurgitation  is not visualized. No aortic stenosis is present.   5. The inferior vena cava is normal in size with greater than 50%  respiratory  variability, suggesting right atrial pressure of 3 mmHg.   Monitor 04/21/18 NSR Rare PAC/PVC s  Symptoms do not correlate with arrhythmia Average HR 69 bpm  CT coronary 03/23/18 Coronary Arteries:  Normal coronary origin.  Right dominance.   RCA is a large dominant artery that gives rise to PDA and PLVB. There is no plaque.   Left main is a large artery that gives rise to LAD and LCX arteries. Left main has no plaque.   LAD is a large vessel that gives rise to two diagonal arteries. Proximal LAD has mild calcified plaque with associated stenosis 0-25%. Mid LAD has a mild non-calcified plaque with associated stenosis 0-25%.   LCX is a small non-dominant artery that gives rise to one small OM1 branch. There is no plaque.   Other findings:   Normal pulmonary vein drainage into the left atrium.   Normal let atrial appendage without a thrombus.   IMPRESSION: 1. Coronary calcium score of 46. This was 57 percentile for age and sex matched control.   2. Normal coronary origin with right dominance.   3. 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 Aggressive medical management is recommended.   4. Mildly dilated pulmonary artery measuring 32 mm suggestive of pulmonary hypertension.    EKG:  EKG is personally reviewed. 12/29/2021:  EKG was not ordered. 03/21/2021: NSR at 69 bpm 10/14/2020: sinus bradycardia at 49 bpm  Recent Labs: 06/28/2021: ALT 11; TSH 1.550 12/19/2021: BUN 17; Creatinine, Ser 0.86; Hemoglobin 12.9; Platelets 270; Potassium 4.7; Sodium 142  Recent Lipid Panel    Component Value Date/Time   CHOL 153 06/28/2021 0803   TRIG 59 06/28/2021 0803   HDL 47 06/28/2021 0803   CHOLHDL 3.3 06/28/2021 0803   CHOLHDL 3.5 03/23/2018 0623   VLDL 8 03/23/2018 0623   LDLCALC 94 06/28/2021 0803   LDLDIRECT 141 (H) 09/23/2020 1130    Physical Exam:    VS:  BP 106/68  Pulse 67   Ht '5\' 8"'$  (1.727 m)   Wt 186 lb (84.4 kg)   LMP 11/18/2021   BMI 28.28  kg/m     Wt Readings from Last 3 Encounters:  12/29/21 186 lb (84.4 kg)  12/06/21 186 lb (84.4 kg)  11/28/21 187 lb (84.8 kg)    GEN: Well nourished, well developed in no acute distress HEENT: Normal, moist mucous membranes NECK: No JVD CARDIAC: regular rhythm, normal S1 and S2, no rubs or gallops. No murmur. VASCULAR: Radial and DP pulses 2+ bilaterally. No carotid bruits RESPIRATORY:  Clear to auscultation without rales, wheezing or rhonchi  ABDOMEN: Soft, non-tender, non-distended MUSCULOSKELETAL:  Ambulates independently SKIN: Warm and dry, no edema NEUROLOGIC:  Alert and oriented x 3. No focal neuro deficits noted. PSYCHIATRIC:  Normal affect   ASSESSMENT:    1. Near syncope   2. Dizziness   3. Nonocclusive coronary atherosclerosis of native coronary artery   4. Hyperlipidemia LDL goal <70   5. Counseling on health promotion and disease prevention    PLAN:    Near syncope, bradycardia, palpitations: -this has been very challenging for her, as events are unpredictable. While some events have occurred with significant sinus bradycardia, most symptomatic events do not correlate with bradycardia/pause/arrhythmia on loop recorder.  -the events have been more frequent with stress -has done compression stockings, fluids, addition of salt to diet without significant improvement. No change with midodrine or florinef -she is losing weight, encouraged her to continue to hydrate and exercise as able as well -follows with EP for loop recorder. Has not had indication for pacemaker based on data from loop -she is asking about paperwork to allow her flexibility with her job. I have previously filled out FMLA paperwork. Today she brings disability forms. I was very honest with her that she does not meet cardiac criteria for a disability (no heart failure, obstructive CAD, arrhythmias, etc. We discussed that there are very specific criteria for this. I will fill out her paperwork to the best  of my ability, but as we have not found a clear cardiac etiology for her symptoms, there are limited items that I can document -I suspect there may be a vagal component. The relationship to both stress and weight loss would support this. Overall hydration, compression, exercise are the recommended management strategies long term.  Nonobstructive CAD -reassuring CT coronary, has minimal nonobstructive CAD. Not suggestive of ischemia -continue aspirin, rosuvastatin -would avoid triptans if possible -counseled on red flag warning signs that need immediate medical attention  Cardiac risk counseling and prevention recommendations: especially with family history of heart disease -recommend heart healthy/Mediterranean diet, with whole grains, fruits, vegetable, fish, lean meats, nuts, and olive oil. -recommend moderate walking, 3-5 times/week for 30-50 minutes each session. Aim for at least 150 minutes.week. Goal should be pace of 3 miles/hours, or walking 1.5 miles in 30 minutes -recommend avoidance of tobacco products. Avoid excess alcohol.  Plan for follow up: 3 months.  Buford Dresser, MD, PhD, [facility]   Medication Adjustments/Labs and Tests Ordered: Current medicines are reviewed at length with the patient today.  Concerns regarding medicines are outlined above.   No orders of the defined types were placed in this encounter.  No orders of the defined types were placed in this encounter.  Patient Instructions  Medication Instructions:  Your Physician recommend you continue on your current medication as directed.    *If you need a refill on your cardiac medications  before your next appointment, please call your pharmacy*   Lab Work: None ordered today   Testing/Procedures: None ordered today   Follow-Up: At [facility], you and your health needs are our priority.  As part of our continuing mission to provide you with exceptional heart care, we  have created designated Provider Care Teams.  These Care Teams include your primary Cardiologist (physician) and Advanced Practice Providers (APPs -  Physician Assistants and Nurse Practitioners) who all work together to provide you with the care you need, when you need it.  We recommend signing up for the patient portal called "MyChart".  Sign up information is provided on this After Visit Summary.  MyChart is used to connect with patients for Virtual Visits (Telemedicine).  Patients are able to view lab/test results, encounter notes, upcoming appointments, etc.  Non-urgent messages can be sent to your provider as well.   To learn more about what you can do with MyChart, go to [email or web address].    Your next appointment:   3 month(s)  The format for your next appointment:   In Person  Provider:   Buford Dresser, MD or Skeet Latch, MD{           I,Mathew Stumpf,acting as a scribe for Buford Dresser, MD.,have documented all relevant documentation on the behalf of Buford Dresser, MD,as directed by  Buford Dresser, MD while in the presence of Buford Dresser, MD.  I, Buford Dresser, MD, have reviewed all documentation for this visit. The documentation on 12/29/21 for the exam, diagnosis, procedures, and orders are all accurate and complete.   Signed, Buford Dresser, MD PhD 12/29/2021  [place]

## 2021-12-29 ENCOUNTER — Other Ambulatory Visit (HOSPITAL_COMMUNITY): Payer: Self-pay

## 2021-12-29 ENCOUNTER — Ambulatory Visit (INDEPENDENT_AMBULATORY_CARE_PROVIDER_SITE_OTHER): Payer: No Typology Code available for payment source | Admitting: Cardiology

## 2021-12-29 ENCOUNTER — Encounter (HOSPITAL_BASED_OUTPATIENT_CLINIC_OR_DEPARTMENT_OTHER): Payer: Self-pay | Admitting: Cardiology

## 2021-12-29 VITALS — BP 106/68 | HR 67 | Ht 68.0 in | Wt 186.0 lb

## 2021-12-29 DIAGNOSIS — Z7189 Other specified counseling: Secondary | ICD-10-CM

## 2021-12-29 DIAGNOSIS — E785 Hyperlipidemia, unspecified: Secondary | ICD-10-CM

## 2021-12-29 DIAGNOSIS — R42 Dizziness and giddiness: Secondary | ICD-10-CM | POA: Diagnosis not present

## 2021-12-29 DIAGNOSIS — R55 Syncope and collapse: Secondary | ICD-10-CM | POA: Diagnosis not present

## 2021-12-29 DIAGNOSIS — I2510 Atherosclerotic heart disease of native coronary artery without angina pectoris: Secondary | ICD-10-CM

## 2021-12-29 NOTE — Patient Instructions (Signed)
Medication Instructions:  Your Physician recommend you continue on your current medication as directed.    *If you need a refill on your cardiac medications before your next appointment, please call your pharmacy*   Lab Work: None ordered today   Testing/Procedures: None ordered today   Follow-Up: At [facility], you and your health needs are our priority.  As part of our continuing mission to provide you with exceptional heart care, we have created designated Provider Care Teams.  These Care Teams include your primary Cardiologist (physician) and Advanced Practice Providers (APPs -  Physician Assistants and Nurse Practitioners) who all work together to provide you with the care you need, when you need it.  We recommend signing up for the patient portal called "MyChart".  Sign up information is provided on this After Visit Summary.  MyChart is used to connect with patients for Virtual Visits (Telemedicine).  Patients are able to view lab/test results, encounter notes, upcoming appointments, etc.  Non-urgent messages can be sent to your provider as well.   To learn more about what you can do with MyChart, go to [email or web address].    Your next appointment:   3 month(s)  The format for your next appointment:   In Person  Provider:   Buford Dresser, MD or Skeet Latch, MD{

## 2022-01-04 ENCOUNTER — Ambulatory Visit (INDEPENDENT_AMBULATORY_CARE_PROVIDER_SITE_OTHER): Payer: No Typology Code available for payment source | Admitting: Adult Health

## 2022-01-05 ENCOUNTER — Ambulatory Visit (INDEPENDENT_AMBULATORY_CARE_PROVIDER_SITE_OTHER): Payer: No Typology Code available for payment source

## 2022-01-05 DIAGNOSIS — R55 Syncope and collapse: Secondary | ICD-10-CM | POA: Diagnosis not present

## 2022-01-07 NOTE — Progress Notes (Signed)
[facility] Summary Report / Loop Recorder 

## 2022-01-08 ENCOUNTER — Other Ambulatory Visit (HOSPITAL_COMMUNITY): Payer: Self-pay

## 2022-01-09 LAB — CUP PACEART REMOTE DEVICE CHECK
Date Time Interrogation Session: 20230905111838
Implantable Pulse Generator Implant Date: 20220216

## 2022-01-10 NOTE — Telephone Encounter (Signed)
Patient following up on paperwork that was dropped off, please advise

## 2022-01-15 ENCOUNTER — Other Ambulatory Visit (HOSPITAL_COMMUNITY): Payer: Self-pay

## 2022-01-16 ENCOUNTER — Telehealth: Payer: Self-pay

## 2022-01-16 NOTE — Telephone Encounter (Signed)
CV Remote Alert~  ILR for brady episodes x 1, 1 EGM available for review occurring at 0153. EGM shows brief dip to < 50bpm. Currently, brady alert notification is programmed to < 50bpm and almost daily reports are received with rates 47-48 (avg rates).  Routing for evaluation of lowering brady notification to increase specificity.   Dr. Lovena Le reviewed scheduled remote on 01/09/2022, no changes were recommended. Receiving almost daily alerts for current programming per CV Remote. Sending due to daily alerts.

## 2022-01-17 ENCOUNTER — Telehealth (HOSPITAL_BASED_OUTPATIENT_CLINIC_OR_DEPARTMENT_OTHER): Payer: Self-pay | Admitting: Cardiology

## 2022-01-17 NOTE — Telephone Encounter (Signed)
Spoke with patient and advised would call when completed Per patient work is giving her hard time because they have not been completed Did explain to patient Dr Harrell Gave had not been in the office   Will forward to Dr Harrell Gave for review

## 2022-01-17 NOTE — Telephone Encounter (Signed)
Spoke to patient to advise [name] [name] would like for patient to come into office to discuss symptoms along with ILR report. Advised scheudler will contact for apt. Patient voiced understanding.

## 2022-01-17 NOTE — Telephone Encounter (Signed)
Patient called to follow-up on FMLA paperwork she left off at last visit.

## 2022-01-17 NOTE — Telephone Encounter (Signed)
Patient reports during episodes logged, she has dizziness, lightheadedness, ears ringing and feeling a dark tunnel is closing in. She also reports of waking up in the middle of the night with rapid palpitations. States she has a difficult time sleeping due to the episodes as well as causing anxiety. Reports of chest tightness at time but not always when symptoms occur, states it could be brought on by anxiety but patient not sure.   Advised I will forward to [name] [name] for review. Appreciative of call.

## 2022-01-18 NOTE — Telephone Encounter (Signed)
I will have the forms ready for her tomorrow. As we discussed at her visit, she does not meet cardiac criteria for disability. We reviewed this previously together, and her paperwork will demonstrate this.

## 2022-01-22 NOTE — Telephone Encounter (Signed)
Patient following up with you 

## 2022-01-24 NOTE — Telephone Encounter (Signed)
Patient getting back with you about her FMLA forms

## 2022-01-25 NOTE — Progress Notes (Signed)
[facility] Summary Report / Loop Recorder 

## 2022-01-25 NOTE — Progress Notes (Signed)
Cardiology Office Note Date:  01/25/2022  Patient ID:  [name], [name] Apr 27, 1981, MRN [id number] PCP:  [name] Deutscher, DO  Cardiologist:  Dr. Harrell Gave Electrophysiologist: Dr. Lovena Le    Chief Complaint: planned f/u  History of Present Illness: [name] [name] is a 41 y.o. female with history of  chronic dizziness/presyncope/syncope, PCOS, prior bariatric surgery, former tobacco use, COVID-19, Nonobstructive CAD  She comes in today to be seen for dr. Lovena Le, last seen by him May 2022, continued to struggle with dizziness, lightheaded/weak spells that could last hours.  No syncope, learning to get sitting/supine quickly with the onset of symptoms. Loop noted some ST, "no long pauses", started on Florinef  Developed nausea and florinef stopped  Following with cardiology team  Most recently C. Walker, NP 10/04/21,  noted prior evaluation on 12/12/2020 by neurology.  Vertigo was easily provoked in clinic with rapid head movements.  She was recommended for repeat sleep study. She had orthostatic vital signs which were positive.  01/31/21 she was started on midodrine 2.'5mg'$  TID. Did not note improvement in symptoms and discontinued.   At the time of this visit she was s/p abdominoplasty with liposuction of mons pubis about 6 weeks, having some post op symptoms including some edema, using some compression stockings. Episodes have overall been less bothersome than previous.  Still with FMLA for up to 3 days of work per month.  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 Planned to resume her statin  Pt reached out requesting FMLA forms be filled out  11/22/21, called reporting dizziness with slow HRs observed, advised to maintain adequate hydration, wear her compression stockings and being seen by EP to review HR data/loop recorder  I saw her 11/28/21 She has had an up tick in symptoms of late and with her pulse Ox noted some HRs to 38-40's and the behavior of her symptoms with these  are different, take longer to recover from. No syncope Here in the waiting room she had a slight episodes and made a symptom episode. She had one this Am as well Sometimes an awareness of her heart beating, not always Episode are mostly the same she will start with ringing in her ears, vision starts to get tunneled, feels weak, lightheaded and then has to get to seated or supine, feels a bit shaky after wards No CP No SOB Parameters on her loop adjusted brady to <50 and pause detection >3 sec  subsequently A number of calls with c/o CP, dizziness,  racing heart, mentioned stressful days at work. Following with plastic sx for panniculitis Follows with weight and wellness clinic Has seen GYN   Saw Dr. Clarice Pole, not e is incomplete, ongoing symptoms, coronary CT was reassuring, noted that she had symptoms with some bradycardia on her loop but the worst of the symptoms were not. Urged to stay hydrated  Ongoing calls with continued symptoms, rates 47-50, HR histograms very good and advised to come in.  TODAY Only once did she have sense of racing heart beats, this woke her from sleep, lasted a few minutes, NOT associated with any other symptoms. No tachy episodes noted  Her dizzy/lightheaded spells always occur the same: She can be seated, more often standing/up and about She starts to feel a fullness or ringing in her ars, then a sense of weakness, lightheaded, and will sit or lay down, put her feet up and will pass. She has not fallen or fainted since she was here. When able to,  she puts her pulse ox on and HRs typically 48-50's with these and seems to connect the symptoms to slow HRs  She had bariatric surgery (gastric sleeve) in 2018 and as she has lost weight her symptoms started and have progressed in frequency and severity She started 330lb > about 214 in the 1st few years, leveled off and now again on weight loss journey > 180's fairly steadily now. Frustrating, because she  feels worse now then she did at her heaviest.  She has had a couple fleeting palpitations, these are brief and otherwise without symptoms.  She has had a dull persistent Headache for about 10 days, no t escalating or changing, but wont go away.  No focal neuro symptoms  Device information MDT LINQ II, implanted 06/22/2020   Past Medical History:  Diagnosis Date   Abnormal uterine bleeding    Amenorrhea    Anemia    Anxiety    panic attacks   Back pain    Bradycardia    Chest pain    COVID 02/2020   Diabetes ([place])    h/o   Dysmenorrhea    Endometriosis    Gallbladder problem    GERD (gastroesophageal reflux disease)    occasional - diet controlled and tums prn   HLD (hyperlipidemia)    HTN (hypertension)    IBS (irritable bowel syndrome)    Infertility, female    Insulin resistance    Migraine without aura    Missed abortion    x 2 - both resolved without surgery   Obesity    PCOS (polycystic ovarian syndrome)    PONV (postoperative nausea and vomiting)    pe rpatient ; scopalamine patch very helpful    SVD (spontaneous vaginal delivery)    x 1   Syncope and collapse 06/21/2020   Urinary incontinence    Vitamin D deficiency     Past Surgical History:  Procedure Laterality Date   ABDOMINOPLASTY/PANNICULECTOMY WITH LIPOSUCTION N/A 08/24/2021   Procedure: ABDOMINOPLASTY/PANNICULECTOMY WITH LIPOSUCTION, LIPOSUCTION OF MONS PUBIS;  Surgeon: [name] Going, DO;  Location: [facility];  Service: Plastics;  Laterality: N/A;   CHOLECYSTECTOMY     COLONOSCOPY  2017   polyps   CYSTOSCOPY N/A 10/29/2016   Procedure: CYSTOSCOPY;  Surgeon: [name] Dom, MD;  Location: [facility];  Service: Gynecology;  Laterality: N/A;   DILATATION & CURRETTAGE/HYSTEROSCOPY WITH RESECTOCOPE N/A 10/17/2012   Procedure: DILATATION & CURETTAGE/HYSTEROSCOPY WITH RESECTOCOPE; Possible Polypectomy, Possible Resectoscopic Myomectomy.;  Surgeon: [name] Stakes. Pamala Hurry, MD;  Location:  [facility];  Service: Gynecology;  Laterality: N/A;  1 hr.   DILATION AND CURETTAGE OF UTERUS     ENDOMETRIAL BIOPSY     ENDOVENOUS ABLATION SAPHENOUS VEIN W/ LASER Right 05/08/2018   endovenous laser ablation right greater saphenous vein and stab phlebectomy > 20 incisions right leg by Deitra Mayo MD    ESOPHAGOGASTRODUODENOSCOPY ENDOSCOPY  2017   Normal   HYSTEROSCOPY WITH D & C N/A 12/01/2013   Procedure: IUD Removal;  Surgeon: [name] Stakes. Pamala Hurry, MD;  Location: [facility];  Service: Gynecology;  Laterality: N/A;  90 min.   INTRAUTERINE DEVICE (IUD) INSERTION N/A 10/17/2012   Procedure: INTRAUTERINE DEVICE (IUD) INSERTION; Mirena;  Surgeon: [name] Stakes. Pamala Hurry, MD;  Location: [facility];  Service: Gynecology;  Laterality: N/A;   LAPAROSCOPIC GASTRIC SLEEVE RESECTION N/A 02/19/2017   Procedure: LAPAROSCOPIC GASTRIC SLEEVE RESECTION, UPPER ENDOSCOPY;  Surgeon: Johnathan Hausen, MD;  Location: [facility];  Service: General;  Laterality: N/A;  LAPAROSCOPIC SALPINGO OOPHERECTOMY Left 10/29/2016   Procedure: LAPAROSCOPIC SALPINGO OOPHORECTOMY With anterior uterine biopsy;  Surgeon: [name] Dom, MD;  Location: [facility];  Service: Gynecology;  Laterality: Left;   LASIK  10/06/2018   LOOP RECORDER INSERTION N/A 06/22/2020   Procedure: LOOP RECORDER INSERTION;  Surgeon: Evans Lance, MD;  Location: [facility];  Service: Cardiovascular;  Laterality: N/A;   MOUTH SURGERY     wisdom teeth    Current Outpatient Medications  Medication Sig Dispense Refill   APPLE CIDER VINEGAR PO Take 1 tablet by mouth daily.     aspirin EC 81 MG tablet Take 81 mg by mouth every other day. Swallow whole.     Biotin w/ Vitamins C & E (HAIR/SKIN/NAILS PO) Take 1 tablet by mouth daily.      clonazePAM (KLONOPIN) 0.5 MG tablet Take 1 tablet by mouth 2  times daily as needed for anxiety. 20 tablet 1   cyanocobalamin (,VITAMIN B-12,) 1000 MCG/ML injection Inject 1 ml once every other week 10 mL 0   levonorgestrel  (MIRENA) 20 MCG/24HR IUD 1 each by Intrauterine route once.     loratadine (CLARITIN) 10 MG tablet Take 10 mg by mouth daily as needed for allergies.     metoprolol tartrate (LOPRESSOR) 50 MG tablet Please take 1 tablet 2 hours prior to CT 1 tablet 0   Multiple Vitamins-Minerals (BARIATRIC MULTIVITAMINS/IRON PO) Take 1 tablet by mouth in the morning and at bedtime.     ondansetron (ZOFRAN-ODT) 4 MG disintegrating tablet Dissolve 1 tablet by mouth every 8 hours as needed for nausea or vomiting. 20 tablet 0   predniSONE (DELTASONE) 50 MG tablet Take 1 tablet by mouth 13 hours before CT, 7 hours before CT, and 1 hour before contrast injection. 3 tablet 0   Probiotic Product (PROBIOTIC ADVANCED PO) Take 1 capsule by mouth daily.      rosuvastatin (CRESTOR) 10 MG tablet Take 1 tablet by mouth daily. 90 tablet 1   SUMAtriptan (IMITREX) 50 MG tablet TAKE 1 TABLET BY MOUTH AT ONSET OF MIGRAINE, MAY REPEAT IN 2 HOURS IF HEADACHE PERSISTS 10 tablet 0   SYRINGE-NEEDLE, DISP, 3 ML 25G X 1" 3 ML MISC Use to administer b12 injections 10 each 0   tirzepatide (MOUNJARO) 2.5 MG/0.5ML Pen Inject 2.5 mg into the skin once a week. 6 mL 0   Vitamin D, Ergocalciferol, (DRISDOL) 1.25 MG (50000 UNIT) CAPS capsule TAKE 1 CAPSULE BY MOUTH EVERY 7 DAYS 8 capsule 0   No current facility-administered medications for this visit.    Allergies:   Iohexol and Pyridium [phenazopyridine hcl]   Social History:  The patient  reports that she quit smoking about 5 years ago. Her smoking use included cigarettes. She has a 4.75 pack-year smoking history. She has never used smokeless tobacco. She reports current alcohol use. She reports that she does not use drugs.   Family History:  The patient's family history includes Alcohol abuse in her paternal grandfather; Breast cancer in her paternal aunt; Colon cancer in her maternal uncle; Dementia in her father; Depression in her mother; Diabetes in her father, maternal grandfather, maternal  grandmother, mother, paternal grandfather, and paternal grandmother; Heart disease in her father, maternal grandfather, maternal grandmother, mother, and paternal grandfather; Hyperlipidemia in her father; Hypertension in her father and mother; Obesity in her father and mother; Parkinson's disease in her father; Sleep apnea in her father; Stroke in her maternal grandfather and paternal grandfather; Thyroid disease in  her father.  ROS:  Please see the history of present illness.    All other systems are reviewed and otherwise negative.   PHYSICAL EXAM:  VS:  There were no vitals taken for this visit. BMI: There is no height or weight on file to calculate BMI. Well nourished, well developed, in no acute distress HEENT: normocephalic, atraumatic Neck: no JVD, carotid bruits or masses Cardiac:  RRR; no significant murmurs, no rubs, or gallops Lungs:  CTA b/l, no wheezing, rhonchi or rales Abd: soft, nontender MS: no deformity or atrophy Ext: no edema Skin: warm and dry, no rash Neuro:  No gross deficits appreciated Psych: euthymic mood, full affect  ILR site is stable, no tethering or discomfort   EKG:  not done today   Device interrogation done today and reviewed by myself:  Battery is good, R waves are 0.19m 202 brady episodes with <50bpm setting, ALL 47bpm or higher  35 symptom episodes One pause episode (false for signal loss) Available EGMs reviewed Symptom episode tracing available was associated with dizziness, lightheaded, HRs were good 60''s-70's No heart block No rates <47  12/25/2021: Coronary CT IMPRESSION: 1. Coronary artery calcium score 67.6 Agatston units. This places the patient in the 99th percentile for age and gender, suggesting high risk for future cardiac events.   2.  Mild nonobstructive CAD.   [place] Venous 08/26/2020: Summary:  Right:  - No evidence of deep vein thrombosis from the common femoral through the  popliteal veins.  - No evidence of  superficial venous thrombosis.  - The common femoral vein is not competent.  - The great saphenous vein is not competent at the knee and proximal calf.  - The small saphenous vein is competent.     Left:  - No evidence of deep vein thrombosis from the common femoral through the  popliteal veins.  - No evidence of superficial venous thrombosis.  - The deep venous system is not competent.  - The great saphenous vein is ablated proximally and competent distally.  - The small saphenous vein is not competent.   Echo 06/22/2020: 1. Left ventricular ejection fraction, by estimation, is 60 to 65%. The  left ventricle has normal function. The left ventricle has no regional  wall motion abnormalities. There is mild left ventricular hypertrophy.  Left ventricular diastolic parameters  were normal.   2. Right ventricular systolic function is normal. The right ventricular  size is normal. Tricuspid regurgitation signal is inadequate for assessing  PA pressure.   3. The mitral valve is normal in structure. No evidence of mitral valve  regurgitation.   4. The aortic valve was not well visualized. Aortic valve regurgitation  is not visualized. No aortic stenosis is present.   5. The inferior vena cava is normal in size with greater than 50%  respiratory variability, suggesting right atrial pressure of 3 mmHg.    Monitor 11/23/2019: 12 days of data recorded on Zio monitor. Patient had a min HR of 37 bpm, max HR of 180 bpm, and avg HR of 67 bpm. Predominant underlying rhythm was Sinus Rhythm. No VT, SVT, atrial fibrillation, high degree block, or pauses noted. Isolated atrial and ventricular ectopy was rare (<1%). There were 91 triggered events. These were all sinus, with occasional isolate PAC or PVC. No significant arrhythmias detected.   Monitor 04/21/18 NSR Rare PAC/PVC s Symptoms do not correlate with arrhythmia Average HR 69 bpm   CT coronary 03/23/18 Coronary Arteries:  Normal coronary  origin.  Right dominance.   RCA is a large dominant artery that gives rise to PDA and PLVB. There is no plaque.   Left main is a large artery that gives rise to LAD and LCX arteries. Left main has no plaque.   LAD is a large vessel that gives rise to two diagonal arteries. Proximal LAD has mild calcified plaque with associated stenosis 0-25%. Mid LAD has a mild non-calcified plaque with associated stenosis 0-25%.   LCX is a small non-dominant artery that gives rise to one small OM1 branch. There is no plaque.   Other findings:   Normal pulmonary vein drainage into the left atrium.   Normal let atrial appendage without a thrombus.   IMPRESSION: 1. Coronary calcium score of 46. This was 87 percentile for age and sex matched control.   2. Normal coronary origin with right dominance.   3. 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 Aggressive medical management is recommended.   4. Mildly dilated pulmonary artery measuring 32 mm suggestive of pulmonary hypertension.  Recent Labs: 06/28/2021: ALT 11; TSH 1.550 12/19/2021: BUN 17; Creatinine, Ser 0.86; Hemoglobin 12.9; Platelets 270; Potassium 4.7; Sodium 142  06/28/2021: Chol/HDL Ratio 3.3; Cholesterol, Total 153; HDL 47; LDL Chol Calc (NIH) 94; Triglycerides 59   CrCl cannot be calculated (Patient's most recent lab result is older than the maximum 21 days allowed.).   Wt Readings from Last 3 Encounters:  12/29/21 186 lb (84.4 kg)  12/06/21 186 lb (84.4 kg)  11/28/21 187 lb (84.8 kg)     Other studies reviewed: Additional studies/records reviewed today include: summarized above  ASSESSMENT AND PLAN:  Dizziness, weak spells  Suspect perhaps component of autonomic dysfunction Onset s/p bariatric surgery and worse the more weight she loses.  I do not think her symptoms are HR driven, HR histograms look good, would not expect HR 47 or better to give her symptoms, and she seems to have HRs here as well without  symptoms  No heart block or arrhythmias Brady parameter was adjusted to <40 She will continue to make symptom transmissions She wears torso compression wear already every day She could maybe do a little more with sodium, discussed this balance She is advised to see call her PMD about her headache.   Disposition: we will continue to follow closely  Current medicines are reviewed at length with the patient today.  The patient did not have any concerns regarding medicines.  Venetia Night, PA-C 01/25/2022 5:36 PM     [address] [place] [address] [phone] (office)  [phone] (fax)

## 2022-01-26 ENCOUNTER — Encounter: Payer: Self-pay | Admitting: Physician Assistant

## 2022-01-26 ENCOUNTER — Ambulatory Visit: Payer: No Typology Code available for payment source | Attending: Physician Assistant | Admitting: Physician Assistant

## 2022-01-26 VITALS — BP 104/74 | HR 76 | Ht 68.0 in | Wt 185.0 lb

## 2022-01-26 DIAGNOSIS — R55 Syncope and collapse: Secondary | ICD-10-CM | POA: Diagnosis not present

## 2022-01-26 DIAGNOSIS — Z4509 Encounter for adjustment and management of other cardiac device: Secondary | ICD-10-CM | POA: Diagnosis not present

## 2022-01-26 NOTE — Patient Instructions (Signed)
Medication Instructions:   Your physician recommends that you continue on your current medications as directed. Please refer to the Current Medication list given to you today.  *If you need a refill on your cardiac medications before your next appointment, please call your pharmacy*   Lab Work: NONE ORDERED  TODAY   If you have labs (blood work) drawn today and your tests are completely normal, you will receive your results only by: MyChart Message (if you have MyChart) OR A paper copy in the mail If you have any lab test that is abnormal or we need to change your treatment, we will call you to review the results.   Testing/Procedures: NONE ORDERED  TODAY    Follow-Up: At [facility], you and your health needs are our priority.  As part of our continuing mission to provide you with exceptional heart care, we have created designated Provider Care Teams.  These Care Teams include your primary Cardiologist (physician) and Advanced Practice Providers (APPs -  Physician Assistants and Nurse Practitioners) who all work together to provide you with the care you need, when you need it.  We recommend signing up for the patient portal called "MyChart".  Sign up information is provided on this After Visit Summary.  MyChart is used to connect with patients for Virtual Visits (Telemedicine).  Patients are able to view lab/test results, encounter notes, upcoming appointments, etc.  Non-urgent messages can be sent to your provider as well.   To learn more about what you can do with MyChart, go to [email or web address].    Your next appointment:   4 month(s)  The format for your next appointment:   In Person  Provider:   Renee Ursuy, PA-C    Other Instructions   Important Information About Sugar       

## 2022-01-28 ENCOUNTER — Encounter (HOSPITAL_BASED_OUTPATIENT_CLINIC_OR_DEPARTMENT_OTHER): Payer: Self-pay | Admitting: Cardiology

## 2022-01-29 ENCOUNTER — Ambulatory Visit: Payer: No Typology Code available for payment source

## 2022-01-31 LAB — CUP PACEART REMOTE DEVICE CHECK
Date Time Interrogation Session: 20230927073247
Implantable Pulse Generator Implant Date: 20220216

## 2022-02-06 ENCOUNTER — Ambulatory Visit (INDEPENDENT_AMBULATORY_CARE_PROVIDER_SITE_OTHER): Payer: No Typology Code available for payment source | Admitting: Cardiology

## 2022-02-06 ENCOUNTER — Ambulatory Visit (INDEPENDENT_AMBULATORY_CARE_PROVIDER_SITE_OTHER): Payer: No Typology Code available for payment source

## 2022-02-06 ENCOUNTER — Encounter (HOSPITAL_BASED_OUTPATIENT_CLINIC_OR_DEPARTMENT_OTHER): Payer: Self-pay | Admitting: Cardiology

## 2022-02-06 VITALS — BP 101/68 | HR 64 | Ht 68.0 in | Wt 185.9 lb

## 2022-02-06 DIAGNOSIS — E785 Hyperlipidemia, unspecified: Secondary | ICD-10-CM

## 2022-02-06 DIAGNOSIS — R55 Syncope and collapse: Secondary | ICD-10-CM

## 2022-02-06 DIAGNOSIS — R42 Dizziness and giddiness: Secondary | ICD-10-CM | POA: Diagnosis not present

## 2022-02-06 DIAGNOSIS — I2510 Atherosclerotic heart disease of native coronary artery without angina pectoris: Secondary | ICD-10-CM

## 2022-02-06 DIAGNOSIS — Z7189 Other specified counseling: Secondary | ICD-10-CM

## 2022-02-06 NOTE — Patient Instructions (Addendum)
Medication Instructions:  Your physician recommends that you continue on your current medications as directed. Please refer to the Current Medication list given to you today.   Labwork: NONE  Testing/Procedures: NONE  Follow-Up: 3 MONTHS   Any Other Special Instructions Will Be Listed Below (If Applicable).  -avoid dehydration. Often it requires high volumes of fluids, often with salt/electrolytes included, to stay hydrated. People with orthostasis are very sensitive to fluid shifts and dehydration. Oral rehydration is preferred, and routine use of IV fluids is not recommended. -if tolerated, compression stocking can assist with fluid management and prevent pooling in the legs. -slow position changes are recommended -if there is a feeling of severe lightheadedness, like near to passing out, recommend lying on the floor on the back, with legs elevated up on a chair or up against the wall. -the best long term management is gradual exercise conditioning. I recommend seated exercises such as bike to start, to avoid the risk of falling with lightheadedness. Exercise programs, either through supervised programs like cardiac rehab or through personal programs, should focus on gradually increasing exercise tolerance and conditioning.    -this is a link to specific exercise recommendations for POTS. We can try it to see if it helps with your symptoms. [email or web address]

## 2022-02-06 NOTE — Progress Notes (Signed)
Cardiology Office Note:    Date:  02/06/2022   ID:  [name] [name], DOB 01/12/1981, MRN [id number]  PCP:  Briscoe Deutscher, DO  Cardiologist:  Buford Dresser, MD  CC: follow up  History of Present Illness:    [name] [name] is a 41 y.o. female with a hx of chronic dizziness, PCOS, prior bariatric surgery, former tobacco use, personal history of Covid infection who is seen for follow up.   At her last appointment she continued to struggle with episodes of dizziness and lightheadedness. During these episodes she suffered from severe fatigue for the rest of the day. She noted increased life stressors which she felt contributed to her symptoms. She did not have improvement on florinef or midodrine, so she discontinued both.  She was most recently seen in cardiology by Tommye Standard, PA-C on 11/28/2021. She reported an up tick in symptoms and had heart rates as low as 38-40 bpm per her pulse Ox. She had described her episodes as starting with ringing in her ears, vision starts to get tunneled, feels weak, lightheaded and then has to get to seated or supine, feels a bit shaky after wards. Given her observation of slow HRs, device changes were made: brady detection to 50 and pause detection >3 seconds.  She had a coronary CT 12/25/2021, and had been premedicated for contrast allergy with 3 doses of prednisone. The next day she started having redness and pruritis around her eyes and face. It was recommended to take Benadryl and follow up with her PCP.  At her visit on 12/29/2021 she was seen to discuss the results of her coronary CT from 12/25/2021. Her coronary artery calcium score was 67.6 Agatston units (99th percentile). Also showed calcified plaque proximal LAD with mild (1-24%) stenosis and mixed plaque mid RCA, mild (1-24%) stenosis.  She continued to suffer from her ongoing episodes of dizziness/lightheadedness. They were occuring sporadically. She may go for weeks without any episodes. The  month prior she had 6 episodes. Of note, she had noticed that month was particularly stressful with multiple life stressors. In the previous 2 weeks she had 1 episode, associated with a drop in blood pressure (99/62). After 5 minutes she began to feel better. She believed her episodes may be related to her recent weight loss, as they had become more frequent overall. In clinic she weighed 186 lbs. She underwent panniculectomy, abdominoplasty and liposuction in 08/2021 without any issues. She also mentioned occasionally waking the middle of the night with racing palpitations, which her pulse ox recorded as a heart rate in the 200s. She felt extremely jittery during those episodes. She had not been exercising as much due to heat but tried to get on the elliptical for 15 minutes a day.   Today she's been having a rough time lately, especially today. She has been feeling a lot of palpitation and felt a bit lightheadedness and worried about her blood pressure.Usually she is drinking at least 3 bottles of water a day, if not more, so she feels like dehydration should not be an issue. She has felt worse than she did when she was over 300 pounds. She is still on the [facility]. She has been wearing compression socks/leggings in order to help.   She denies any chest pain, shortness of breath, or peripheral edema. No headaches, syncope, orthopnea, or PND.    Past Medical History:  Diagnosis Date   Abnormal uterine bleeding    Amenorrhea    Anemia  Anxiety    panic attacks   Back pain    Bradycardia    Chest pain    COVID 02/2020   Diabetes ([place])    h/o   Dysmenorrhea    Endometriosis    Gallbladder problem    GERD (gastroesophageal reflux disease)    occasional - diet controlled and tums prn   HLD (hyperlipidemia)    HTN (hypertension)    IBS (irritable bowel syndrome)    Infertility, female    Insulin resistance    Migraine without aura    Missed abortion    x 2 - both resolved without surgery    Obesity    PCOS (polycystic ovarian syndrome)    PONV (postoperative nausea and vomiting)    pe rpatient ; scopalamine patch very helpful    SVD (spontaneous vaginal delivery)    x 1   Syncope and collapse 06/21/2020   Urinary incontinence    Vitamin D deficiency     Past Surgical History:  Procedure Laterality Date   ABDOMINOPLASTY/PANNICULECTOMY WITH LIPOSUCTION N/A 08/24/2021   Procedure: ABDOMINOPLASTY/PANNICULECTOMY WITH LIPOSUCTION, LIPOSUCTION OF MONS PUBIS;  Surgeon: Wallace Going, DO;  Location: [place];  Service: Plastics;  Laterality: N/A;   CHOLECYSTECTOMY     COLONOSCOPY  2017   polyps   CYSTOSCOPY N/A 10/29/2016   Procedure: CYSTOSCOPY;  Surgeon: Salvadore Dom, MD;  Location: [place] [facility];  Service: Gynecology;  Laterality: N/A;   DILATATION & CURRETTAGE/HYSTEROSCOPY WITH RESECTOCOPE N/A 10/17/2012   Procedure: DILATATION & CURETTAGE/HYSTEROSCOPY WITH RESECTOCOPE; Possible Polypectomy, Possible Resectoscopic Myomectomy.;  Surgeon: Floyce Stakes. Pamala Hurry, MD;  Location: [place] [facility];  Service: Gynecology;  Laterality: N/A;  1 hr.   DILATION AND CURETTAGE OF UTERUS     ENDOMETRIAL BIOPSY     ENDOVENOUS ABLATION SAPHENOUS VEIN W/ LASER Right 05/08/2018   endovenous laser ablation right greater saphenous vein and stab phlebectomy > 20 incisions right leg by Deitra Mayo MD    ESOPHAGOGASTRODUODENOSCOPY ENDOSCOPY  2017   Normal   HYSTEROSCOPY WITH D & C N/A 12/01/2013   Procedure: IUD Removal;  Surgeon: Floyce Stakes. Pamala Hurry, MD;  Location: [place] [facility];  Service: Gynecology;  Laterality: N/A;  90 min.   INTRAUTERINE DEVICE (IUD) INSERTION N/A 10/17/2012   Procedure: INTRAUTERINE DEVICE (IUD) INSERTION; Mirena;  Surgeon: Floyce Stakes. Pamala Hurry, MD;  Location: [facility] [place];  Service: Gynecology;  Laterality: N/A;   LAPAROSCOPIC GASTRIC SLEEVE RESECTION N/A 02/19/2017   Procedure: LAPAROSCOPIC GASTRIC SLEEVE RESECTION, UPPER ENDOSCOPY;  Surgeon: Johnathan Hausen, MD;  Location:  [facility];  Service: General;  Laterality: N/A;   LAPAROSCOPIC SALPINGO OOPHERECTOMY Left 10/29/2016   Procedure: LAPAROSCOPIC SALPINGO OOPHORECTOMY With anterior uterine biopsy;  Surgeon: Salvadore Dom, MD;  Location: [facility];  Service: Gynecology;  Laterality: Left;   LASIK  10/06/2018   LOOP RECORDER INSERTION N/A 06/22/2020   Procedure: LOOP RECORDER INSERTION;  Surgeon: Evans Lance, MD;  Location: [facility];  Service: Cardiovascular;  Laterality: N/A;   MOUTH SURGERY     wisdom teeth    Current Medications: Current Outpatient Medications on File Prior to Visit  Medication Sig   APPLE CIDER VINEGAR PO Take 1 tablet by mouth daily.   aspirin EC 81 MG tablet Take 81 mg by mouth every other day. Swallow whole.   Biotin w/ Vitamins C & E (HAIR/SKIN/NAILS PO) Take 1 tablet by mouth daily.    clonazePAM (KLONOPIN) 0.5 MG tablet Take 1 tablet by mouth 2  times  daily as needed for anxiety.   cyanocobalamin (,VITAMIN B-12,) 1000 MCG/ML injection Inject 1 ml once every other week   levonorgestrel (MIRENA) 20 MCG/24HR IUD 1 each by Intrauterine route once.   loratadine (CLARITIN) 10 MG tablet Take 10 mg by mouth daily as needed for allergies.   Multiple Vitamins-Minerals (BARIATRIC MULTIVITAMINS/IRON PO) Take 1 tablet by mouth in the morning and at bedtime.   ondansetron (ZOFRAN-ODT) 4 MG disintegrating tablet Dissolve 1 tablet by mouth every 8 hours as needed for nausea or vomiting.   Probiotic Product (PROBIOTIC ADVANCED PO) Take 1 capsule by mouth daily.    rosuvastatin (CRESTOR) 10 MG tablet Take 1 tablet by mouth daily.   SUMAtriptan (IMITREX) 50 MG tablet TAKE 1 TABLET BY MOUTH AT ONSET OF MIGRAINE, MAY REPEAT IN 2 HOURS IF HEADACHE PERSISTS   SYRINGE-NEEDLE, DISP, 3 ML 25G X 1" 3 ML MISC Use to administer b12 injections   tirzepatide (MOUNJARO) 2.5 MG/0.5ML Pen Inject 2.5 mg into the skin once a week.   Vitamin D, Ergocalciferol, (DRISDOL) 1.25 MG (50000 UNIT) CAPS capsule  TAKE 1 CAPSULE BY MOUTH EVERY 7 DAYS   No current facility-administered medications on file prior to visit.     Allergies:   Iohexol and Pyridium [phenazopyridine hcl]   Social History   Tobacco Use   Smoking status: Former    Packs/day: 0.25    Years: 19.00    Total pack years: 4.75    Types: Cigarettes    Quit date: 06/06/2016    Years since quitting: 5.6   Smokeless tobacco: Never  Vaping Use   Vaping Use: Never used  Substance Use Topics   Alcohol use: Yes    Comment: 1 a month   Drug use: No    Family History: family history includes Alcohol abuse in her paternal grandfather; Breast cancer in her paternal aunt; Colon cancer in her maternal uncle; Dementia in her father; Depression in her mother; Diabetes in her father, maternal grandfather, maternal grandmother, mother, paternal grandfather, and paternal grandmother; Heart disease in her father, maternal grandfather, maternal grandmother, mother, and paternal grandfather; Hyperlipidemia in her father; Hypertension in her father and mother; Obesity in her father and mother; Parkinson's disease in her father; Sleep apnea in her father; Stroke in her maternal grandfather and paternal grandfather; Thyroid disease in her father. Family history: multiple family members with heart attacks and stents. Mat Gpa had bypass surgery and a pacemaker. Father has heart issues, had cath recently but no stent. Mother has mitral valve prolapse and palpitations. Gena Fray, paternal great-gpa, and uncle all had MI.   ROS:   Please see the history of present illness.   (+) palpitations  (+) lightheadedness Additional pertinent ROS otherwise unremarkable.  EKGs/Labs/Other Studies Reviewed:    The following studies were reviewed today:  Coronary CT  12/25/2021: FINDINGS: Non-cardiac: See separate report from [facility].   No LA appendage thrombus. Pulmonary veins drain normally to the left atrium.   Calcium Score: 67.6 Agatston  units.   Coronary Arteries: Right dominant with no anomalies   LM: No plaque or stenosis.   LAD system: Calcified plaque proximal LAD with mild (1-24%) stenosis.   Circumflex system: No plaque or stenosis.   RCA system: Mixed plaque mid RCA, mild (1-24%) stenosis.   IMPRESSION: 1. Coronary artery calcium score 67.6 Agatston units. This places the patient in the 99th percentile for age and gender, suggesting high risk for future cardiac events.   2.  Mild nonobstructive  CAD.  Bilateral Carotid Dopplers  03/16/2021: Summary:  Right Carotid: There was no evidence of thrombus, dissection,  atherosclerotic                 plaque or stenosis in the cervical carotid system.   Left Carotid: There was no evidence of thrombus, dissection,  atherosclerotic                plaque or stenosis in the cervical carotid system.   Vertebrals:  Bilateral vertebral arteries demonstrate antegrade flow.  Subclavians: Normal flow hemodynamics were seen in bilateral subclavian arteries.   Echocardiogram  06/22/2020: Sonographer Comments: Technically difficult study due to poor echo  windows. Image acquisition challenging due to patient body habitus.  Patient has fresh loop recorder implant in parasternal region. Off axis  views.  IMPRESSIONS    1. Left ventricular ejection fraction, by estimation, is 60 to 65%. The  left ventricle has normal function. The left ventricle has no regional  wall motion abnormalities. There is mild left ventricular hypertrophy.  Left ventricular diastolic parameters  were normal.   2. Right ventricular systolic function is normal. The right ventricular  size is normal. Tricuspid regurgitation signal is inadequate for assessing  PA pressure.   3. The mitral valve is normal in structure. No evidence of mitral valve  regurgitation.   4. The aortic valve was not well visualized. Aortic valve regurgitation  is not visualized. No aortic stenosis is present.   5. The  inferior vena cava is normal in size with greater than 50%  respiratory variability, suggesting right atrial pressure of 3 mmHg.   Monitor 04/21/18 NSR Rare PAC/PVC s  Symptoms do not correlate with arrhythmia Average HR 69 bpm  CT coronary 03/23/18 Coronary Arteries:  Normal coronary origin.  Right dominance.   RCA is a large dominant artery that gives rise to PDA and PLVB. There is no plaque.   Left main is a large artery that gives rise to LAD and LCX arteries. Left main has no plaque.   LAD is a large vessel that gives rise to two diagonal arteries. Proximal LAD has mild calcified plaque with associated stenosis 0-25%. Mid LAD has a mild non-calcified plaque with associated stenosis 0-25%.   LCX is a small non-dominant artery that gives rise to one small OM1 branch. There is no plaque.   Other findings:   Normal pulmonary vein drainage into the left atrium.   Normal let atrial appendage without a thrombus.   IMPRESSION: 1. Coronary calcium score of 46. This was 65 percentile for age and sex matched control.   2. Normal coronary origin with right dominance.   3. 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 Aggressive medical management is recommended.   4. Mildly dilated pulmonary artery measuring 32 mm suggestive of pulmonary hypertension.    EKG:  EKG is personally reviewed. 02/06/2022: not ordered today 12/29/2021:  EKG was not ordered. 03/21/2021: NSR at 69 bpm 10/14/2020: sinus bradycardia at 49 bpm  Recent Labs: 06/28/2021: ALT 11; TSH 1.550 12/19/2021: BUN 17; Creatinine, Ser 0.86; Hemoglobin 12.9; Platelets 270; Potassium 4.7; Sodium 142  Recent Lipid Panel    Component Value Date/Time   CHOL 153 06/28/2021 0803   TRIG 59 06/28/2021 0803   HDL 47 06/28/2021 0803   CHOLHDL 3.3 06/28/2021 0803   CHOLHDL 3.5 03/23/2018 0623   VLDL 8 03/23/2018 0623   LDLCALC 94 06/28/2021 0803   LDLDIRECT 141 (H) 09/23/2020 1130  Physical Exam:    VS:   BP 101/68 (BP Location: Right Arm, Patient Position: Sitting, Cuff Size: Large)   Pulse 64   Ht '5\' 8"'$  (1.727 m)   Wt 185 lb 14.4 oz (84.3 kg)   SpO2 99%   BMI 28.27 kg/m     Wt Readings from Last 3 Encounters:  01/26/22 185 lb (83.9 kg)  12/29/21 186 lb (84.4 kg)  12/06/21 186 lb (84.4 kg)    GEN: Well nourished, well developed in no acute distress HEENT: Normal, moist mucous membranes NECK: No JVD CARDIAC: regular rhythm, normal S1 and S2, no rubs or gallops. No murmur. VASCULAR: Radial and DP pulses 2+ bilaterally. No carotid bruits RESPIRATORY:  Clear to auscultation without rales, wheezing or rhonchi  ABDOMEN: Soft, non-tender, non-distended MUSCULOSKELETAL:  Ambulates independently SKIN: Warm and dry, no edema NEUROLOGIC:  Alert and oriented x 3. No focal neuro deficits noted. PSYCHIATRIC:  Normal affect   ASSESSMENT:    1. Near syncope   2. Hyperlipidemia LDL goal <70   3. Dizziness   4. Nonocclusive coronary atherosclerosis of native coronary artery   5. Counseling on health promotion and disease prevention     PLAN:    Near syncope, bradycardia, palpitations: -Difficult situation. Most symptomatic events do not correlate with bradycardia/pause/arrhythmia on loop recorder. Suspect there is a significant noncardiac component -the events have been more frequent with stress -has done compression stockings, fluids, addition of salt to diet without significant improvement. No change with midodrine or florinef -she is losing weight, encouraged her to continue to hydrate and exercise as able as well. May need to consider stopping GLP1RA if symptoms persist to see if this is playing a role -follows with EP for loop recorder. Has not had indication for pacemaker based on data from loop -I suspect there may be a vagal component. The relationship to both stress and weight loss would support this. Overall hydration, compression, exercise are the recommended management strategies  long term. -we have discussed cardiovascular criteria requirements for both disability and FMLA/accomodations  Nonobstructive CAD -reassuring CT coronary, has minimal nonobstructive CAD. Not suggestive of ischemia -continue aspirin, rosuvastatin -would avoid triptans if possible -counseled on red flag warning signs that need immediate medical attention  Cardiac risk counseling and prevention recommendations: especially with family history of heart disease -recommend heart healthy/Mediterranean diet, with whole grains, fruits, vegetable, fish, lean meats, nuts, and olive oil. -recommend moderate walking, 3-5 times/week for 30-50 minutes each session. Aim for at least 150 minutes.week. Goal should be pace of 3 miles/hours, or walking 1.5 miles in 30 minutes -recommend avoidance of tobacco products. Avoid excess alcohol.  Plan for follow up:3 months  Buford Dresser, MD, PhD, [facility]   Medication Adjustments/Labs and Tests Ordered: Current medicines are reviewed at length with the patient today.  Concerns regarding medicines are outlined above.   No orders of the defined types were placed in this encounter.  No orders of the defined types were placed in this encounter.  Patient Instructions  Medication Instructions:  Your physician recommends that you continue on your current medications as directed. Please refer to the Current Medication list given to you today.   Labwork: NONE  Testing/Procedures: NONE  Follow-Up: 3 MONTHS   Any Other Special Instructions Will Be Listed Below (If Applicable).  -avoid dehydration. Often it requires high volumes of fluids, often with salt/electrolytes included, to stay hydrated. People with orthostasis are very sensitive to fluid shifts and dehydration.  Oral rehydration is preferred, and routine use of IV fluids is not recommended. -if tolerated, compression stocking can assist with fluid management and prevent  pooling in the legs. -slow position changes are recommended -if there is a feeling of severe lightheadedness, like near to passing out, recommend lying on the floor on the back, with legs elevated up on a chair or up against the wall. -the best long term management is gradual exercise conditioning. I recommend seated exercises such as bike to start, to avoid the risk of falling with lightheadedness. Exercise programs, either through supervised programs like cardiac rehab or through personal programs, should focus on gradually increasing exercise tolerance and conditioning.    -this is a link to specific exercise recommendations for POTS. We can try it to see if it helps with your symptoms. http://www.smith-bell.org/    I,Jessica Ford,acting as a Education administrator for [employer], MD.,have documented all relevant documentation on the behalf of Buford Dresser, MD,as directed by  Buford Dresser, MD while in the presence of Buford Dresser, MD.   I, Buford Dresser, MD, have reviewed all documentation for this visit. The documentation on 05/27/22 for the exam, diagnosis, procedures, and orders are all accurate and complete.   Signed, Buford Dresser, MD PhD 02/06/2022  [place]

## 2022-02-12 ENCOUNTER — Encounter (INDEPENDENT_AMBULATORY_CARE_PROVIDER_SITE_OTHER): Payer: Self-pay | Admitting: Adult Health

## 2022-02-13 ENCOUNTER — Other Ambulatory Visit (HOSPITAL_COMMUNITY): Payer: Self-pay

## 2022-02-13 ENCOUNTER — Encounter (INDEPENDENT_AMBULATORY_CARE_PROVIDER_SITE_OTHER): Payer: Self-pay | Admitting: Adult Health

## 2022-02-13 ENCOUNTER — Ambulatory Visit: Payer: No Typology Code available for payment source | Admitting: Nurse Practitioner

## 2022-02-13 ENCOUNTER — Ambulatory Visit (INDEPENDENT_AMBULATORY_CARE_PROVIDER_SITE_OTHER): Payer: No Typology Code available for payment source | Admitting: Adult Health

## 2022-02-13 VITALS — BP 99/68 | HR 59 | Temp 97.7°F | Ht 68.0 in | Wt 185.0 lb

## 2022-02-13 DIAGNOSIS — E538 Deficiency of other specified B group vitamins: Secondary | ICD-10-CM

## 2022-02-13 DIAGNOSIS — Z7985 Long-term (current) use of injectable non-insulin antidiabetic drugs: Secondary | ICD-10-CM

## 2022-02-13 DIAGNOSIS — E669 Obesity, unspecified: Secondary | ICD-10-CM | POA: Diagnosis not present

## 2022-02-13 DIAGNOSIS — Z6834 Body mass index (BMI) 34.0-34.9, adult: Secondary | ICD-10-CM

## 2022-02-13 DIAGNOSIS — Z6828 Body mass index (BMI) 28.0-28.9, adult: Secondary | ICD-10-CM | POA: Diagnosis not present

## 2022-02-13 DIAGNOSIS — E119 Type 2 diabetes mellitus without complications: Secondary | ICD-10-CM | POA: Diagnosis not present

## 2022-02-13 MED ORDER — TIRZEPATIDE 5 MG/0.5ML ~~LOC~~ SOAJ
5.0000 mg | SUBCUTANEOUS | 0 refills | Status: DC
  Filled 2022-02-13: qty 6, 84d supply, fill #0

## 2022-02-13 MED ORDER — TIRZEPATIDE 2.5 MG/0.5ML ~~LOC~~ SOAJ
2.5000 mg | SUBCUTANEOUS | 0 refills | Status: DC
  Filled 2022-02-13: qty 6, 84d supply, fill #0

## 2022-02-13 MED ORDER — CYANOCOBALAMIN 1000 MCG/ML IJ SOLN
INTRAMUSCULAR | 0 refills | Status: DC
  Filled 2022-02-13: qty 5, 70d supply, fill #0
  Filled 2022-05-02 – 2022-05-03 (×2): qty 6, 84d supply, fill #1

## 2022-02-13 MED ORDER — "BD LUER-LOK SYRINGE 25G X 5/8"" 3 ML MISC"
1.0000 | 0 refills | Status: DC
  Filled 2022-02-13: qty 12, 84d supply, fill #0

## 2022-02-13 MED ORDER — CYANOCOBALAMIN 1000 MCG/ML IJ SOLN
INTRAMUSCULAR | 0 refills | Status: DC
  Filled 2022-02-13: qty 10, fill #0

## 2022-02-13 MED ORDER — TIRZEPATIDE 5 MG/0.5ML ~~LOC~~ SOAJ
5.0000 mg | SUBCUTANEOUS | 0 refills | Status: DC
  Filled 2022-02-13: qty 6, 84d supply, fill #0
  Filled 2022-02-13: qty 2, 28d supply, fill #0
  Filled 2022-04-11 – 2022-04-16 (×2): qty 2, 28d supply, fill #1

## 2022-02-14 ENCOUNTER — Ambulatory Visit (INDEPENDENT_AMBULATORY_CARE_PROVIDER_SITE_OTHER): Payer: No Typology Code available for payment source | Admitting: Nurse Practitioner

## 2022-02-14 ENCOUNTER — Encounter: Payer: Self-pay | Admitting: Nurse Practitioner

## 2022-02-14 ENCOUNTER — Other Ambulatory Visit (HOSPITAL_COMMUNITY): Payer: Self-pay

## 2022-02-14 VITALS — BP 102/62 | HR 73

## 2022-02-14 DIAGNOSIS — N898 Other specified noninflammatory disorders of vagina: Secondary | ICD-10-CM | POA: Diagnosis not present

## 2022-02-14 DIAGNOSIS — B9689 Other specified bacterial agents as the cause of diseases classified elsewhere: Secondary | ICD-10-CM

## 2022-02-14 DIAGNOSIS — N760 Acute vaginitis: Secondary | ICD-10-CM | POA: Diagnosis not present

## 2022-02-14 DIAGNOSIS — N912 Amenorrhea, unspecified: Secondary | ICD-10-CM | POA: Diagnosis not present

## 2022-02-14 DIAGNOSIS — Z113 Encounter for screening for infections with a predominantly sexual mode of transmission: Secondary | ICD-10-CM | POA: Diagnosis not present

## 2022-02-14 LAB — PREGNANCY, URINE: Preg Test, Ur: NEGATIVE

## 2022-02-14 LAB — WET PREP FOR TRICH, YEAST, CLUE

## 2022-02-14 MED ORDER — METRONIDAZOLE 500 MG PO TABS
500.0000 mg | ORAL_TABLET | Freq: Two times a day (BID) | ORAL | 0 refills | Status: DC
  Filled 2022-02-14: qty 14, 7d supply, fill #0

## 2022-02-14 NOTE — Progress Notes (Signed)
   Acute Office Visit  Subjective:    [name], female    DOB: March 27, 1981, 41 y.o.   MRN: [id number]   HPI 41 y.o. presents today for vaginal odor, itching, and clear thin discharge x 3 days. Would like UPT and STD screening today, declines HIV/RPR - just had done in August. Amenorrheic with IUD and just wants reassurance.    Review of Systems  Constitutional: Negative.   Genitourinary:  Positive for vaginal discharge and vaginal pain (Itching).       Vaginal odor       Objective:    Physical Exam Constitutional:      Appearance: Normal appearance.  Genitourinary:    General: Normal vulva.     Vagina: Vaginal discharge and erythema present.     Cervix: Normal.     BP 102/62   Pulse 73   SpO2 97%  Wt Readings from Last 3 Encounters:  02/13/22 185 lb (83.9 kg)  02/06/22 185 lb 14.4 oz (84.3 kg)  01/26/22 185 lb (83.9 kg)        Patient informed chaperone available to be present for breast and/or pelvic exam. Patient has requested no chaperone to be present. Patient has been advised what will be completed during breast and pelvic exam.   UPT negative Wet prep + clue cells (+ odor)  Assessment & Plan:   Problem List Items Addressed This Visit   None Visit Diagnoses     Bacterial vaginosis    -  Primary   Relevant Medications   metroNIDAZOLE (FLAGYL) 500 MG tablet   Screening examination for STD (sexually transmitted disease)       Relevant Orders   C. trachomatis/N. gonorrhoeae RNA   Vaginal discharge       Relevant Orders   WET PREP FOR D[place]ana, YEAST, CLUE   Amenorrhea       Relevant Orders   Pregnancy, urine      Plan: Wet prep positive for clue cells - Flagyl 500 mg BID x 7 days. GC/CT pending, declines HIV/RPR. UPT negative.      [place], 1:53 PM 02/14/2022

## 2022-02-15 LAB — C. TRACHOMATIS/N. GONORRHOEAE RNA
C. trachomatis RNA, TMA: NOT DETECTED
N. gonorrhoeae RNA, TMA: NOT DETECTED

## 2022-02-16 ENCOUNTER — Other Ambulatory Visit (HOSPITAL_COMMUNITY): Payer: Self-pay

## 2022-02-19 NOTE — Progress Notes (Signed)
[facility] Summary Report / Loop Recorder 

## 2022-02-20 ENCOUNTER — Encounter: Payer: Self-pay | Admitting: Plastic Surgery

## 2022-02-20 ENCOUNTER — Ambulatory Visit (INDEPENDENT_AMBULATORY_CARE_PROVIDER_SITE_OTHER): Payer: No Typology Code available for payment source | Admitting: Plastic Surgery

## 2022-02-20 DIAGNOSIS — Z9889 Other specified postprocedural states: Secondary | ICD-10-CM

## 2022-02-20 NOTE — Progress Notes (Unsigned)
Chief Complaint:   OBESITY Careen is here to discuss her progress with her obesity treatment plan along with follow-up of her obesity related diagnoses. Azul is on the Category 1 Plan and states she is following her eating plan approximately 85% of the time. Cristen states she is doing cardio and resistance training 30 minutes 3 times per week.  Today's visit was #: 82 Starting weight: 243 lbs Starting date: 06/18/2019 Today's weight: 185 lbs Today's date: 02/13/2022 Total lbs lost to date: 58 lbs Total lbs lost since last in-office visit: 1 lb  Interim History: Yeraldy would like to lose another 10 lbs.  Current weight 185 lbs with corresponding BMI 28.1, 175 with corresponding BMI 26.6.  Since abdominoplasty increased 10 lbs and has been snacking at night time.   Subjective:   1. Type 2 diabetes mellitus without complication, without long-term current use of insulin (HCC) She has been on Mounjaro 2.5 since *** Fasting blood glucose 87-110, no symptoms of hypoglycemia.    2. B12 deficiency She has been on 5 months of injection therapy.   Assessment/Plan:   1. Type 2 diabetes mellitus without complication, without long-term current use of insulin (HCC) Refill - SYRINGE-NEEDLE, DISP, 3 ML (B-D 3CC LUER-LOK SYR 25GX5/8") 25G X 5/8" 3 ML MISC; Use to inject b12 every other week.  Dispense: 12 each; Refill: 0  Increase - tirzepatide (MOUNJARO) 5 MG/0.5ML Pen; Inject 5 mg into the skin once a week.  Dispense: 18 mL; Refill: 0  2. B12 deficiency Refill - cyanocobalamin (VITAMIN B12) 1000 MCG/ML injection; Inject 1 ml once every other week  Dispense: 30 mL; Refill: 0  3. Obesity, current BMI 28.1 Check fasting labs and IC at next office visit. Patient is aware to arrive 30 minutes prior to office visit.   Verley is currently in the action stage of change. As such, her goal is to continue with weight loss efforts. She has agreed to the Category 1 Plan.   Exercise goals:   As is.   Behavioral modification strategies: increasing lean protein intake, decreasing simple carbohydrates, meal planning and cooking strategies, keeping healthy foods in the home, and planning for success.  Chaka has agreed to follow-up with our clinic in 8 weeks. She was informed of the importance of frequent follow-up visits to maximize her success with intensive lifestyle modifications for her multiple health conditions.   Objective:   Blood pressure 99/68, pulse (!) 59, temperature 97.7 F (36.5 C), height '5\' 8"'$  (1.727 m), weight 185 lb (83.9 kg), SpO2 100 %. Body mass index is 28.13 kg/m.  General: Cooperative, alert, well developed, in no acute distress. HEENT: Conjunctivae and lids unremarkable. Cardiovascular: Regular rhythm.  Lungs: Normal work of breathing. Neurologic: No focal deficits.   Lab Results  Component Value Date   CREATININE 0.86 12/19/2021   BUN 17 12/19/2021   NA 142 12/19/2021   K 4.7 12/19/2021   CL 104 12/19/2021   CO2 21 12/19/2021   Lab Results  Component Value Date   ALT 11 06/28/2021   AST 13 06/28/2021   ALKPHOS 59 06/28/2021   BILITOT 0.5 06/28/2021   Lab Results  Component Value Date   HGBA1C 5.7 (H) 12/06/2021   HGBA1C 5.2 06/28/2021   HGBA1C 5.7 (H) 09/23/2020   HGBA1C 5.8 (H) 08/18/2020   HGBA1C 5.6 10/14/2019   Lab Results  Component Value Date   INSULIN 11.0 08/18/2020   INSULIN 11.9 10/14/2019   INSULIN 10.1 06/18/2019  Lab Results  Component Value Date   TSH 1.550 06/28/2021   Lab Results  Component Value Date   CHOL 153 06/28/2021   HDL 47 06/28/2021   LDLCALC 94 06/28/2021   LDLDIRECT 141 (H) 09/23/2020   TRIG 59 06/28/2021   CHOLHDL 3.3 06/28/2021   Lab Results  Component Value Date   VD25OH 58.7 12/06/2021   VD25OH 83.7 06/28/2021   VD25OH 48.6 08/18/2020   Lab Results  Component Value Date   WBC 8.4 12/19/2021   HGB 12.9 12/19/2021   HCT 39.6 12/19/2021   MCV 81 12/19/2021   PLT 270 12/19/2021    Lab Results  Component Value Date   IRON 69 07/05/2021   TIBC 267 07/05/2021   FERRITIN 65 07/05/2021   Attestation Statements:   Reviewed by clinician on day of visit: allergies, medications, problem list, medical history, surgical history, family history, social history, and previous encounter notes.  I, Davy Pique, RMA, am acting as Location manager for [name], NP.  I have reviewed the above documentation for accuracy and completeness, and I agree with the above. -  ***

## 2022-02-20 NOTE — Progress Notes (Signed)
   Subjective:    [name], female    DOB: November 03, 1980, 41 y.o.   MRN: [id number]  The patient is a 41 year old female here for follow-up on her abdominoplasty.  She feels like the mons area has dropped a little bit.  She also complains of labia following out of her panties.  I have recommended that she see a GYN about the labia.  There is not an area that I operate on.  The mons can be lifted about a centimeter.  There is a chance though that it will drop just because of gravity.  I like her to see the GYN first before we do anything.      Review of Systems  Constitutional: Negative.   Eyes: Negative.   Respiratory: Negative.    Cardiovascular: Negative.   Gastrointestinal: Negative.   Endocrine: Negative.   Genitourinary: Negative.   Musculoskeletal: Negative.        Objective:   Physical Exam Constitutional:      Appearance: Normal appearance.  Cardiovascular:     Rate and Rhythm: Normal rate.     Pulses: Normal pulses.  Pulmonary:     Effort: Pulmonary effort is normal.  Abdominal:     Palpations: Abdomen is soft.  Skin:    Capillary Refill: Capillary refill takes less than 2 seconds.  Neurological:     Mental Status: She is alert and oriented to person, place, and time.  Psychiatric:        Mood and Affect: Mood normal.        Behavior: Behavior normal.        Thought Content: Thought content normal.        Judgment: Judgment normal.         Assessment & Plan:     ICD-10-CM   1. H/O abdominoplasty  Z98.890       Plan to see the patient back after she sees GYN.  This is something that could be modified in the office the area of the mons.

## 2022-02-22 ENCOUNTER — Encounter: Payer: Self-pay | Admitting: Obstetrics and Gynecology

## 2022-02-27 ENCOUNTER — Encounter (HOSPITAL_BASED_OUTPATIENT_CLINIC_OR_DEPARTMENT_OTHER): Payer: Self-pay

## 2022-02-27 NOTE — Telephone Encounter (Signed)
Please advise 

## 2022-02-28 ENCOUNTER — Ambulatory Visit (INDEPENDENT_AMBULATORY_CARE_PROVIDER_SITE_OTHER): Payer: No Typology Code available for payment source | Admitting: Adult Health

## 2022-03-02 NOTE — Telephone Encounter (Signed)
Spoke with patient.  Provided contact information for Dr. Irene Limbo at [facility] in [place], may be able to do surgery.  Patient will contact them directly to schedule consult if she desires to proceed. She will contact the office if she needs any additional assistance.  Patient thankful for call.   Encounter closed.

## 2022-03-05 ENCOUNTER — Ambulatory Visit (INDEPENDENT_AMBULATORY_CARE_PROVIDER_SITE_OTHER): Payer: No Typology Code available for payment source

## 2022-03-05 DIAGNOSIS — R55 Syncope and collapse: Secondary | ICD-10-CM

## 2022-03-06 LAB — CUP PACEART REMOTE DEVICE CHECK
Date Time Interrogation Session: 20231029231038
Implantable Pulse Generator Implant Date: 20220216

## 2022-03-13 NOTE — Telephone Encounter (Signed)
Here you go, thank you!

## 2022-03-27 ENCOUNTER — Encounter: Payer: Self-pay | Admitting: Plastic Surgery

## 2022-03-27 ENCOUNTER — Ambulatory Visit (INDEPENDENT_AMBULATORY_CARE_PROVIDER_SITE_OTHER): Payer: No Typology Code available for payment source | Admitting: Plastic Surgery

## 2022-03-27 VITALS — BP 97/67 | HR 77 | Ht 68.0 in | Wt 188.2 lb

## 2022-03-27 DIAGNOSIS — Z9889 Other specified postprocedural states: Secondary | ICD-10-CM

## 2022-03-27 NOTE — Progress Notes (Signed)
The patient and I had a good talk about plan and expectation.  She is going to return on Monday for the procedure.

## 2022-04-02 ENCOUNTER — Ambulatory Visit (INDEPENDENT_AMBULATORY_CARE_PROVIDER_SITE_OTHER): Payer: No Typology Code available for payment source | Admitting: Plastic Surgery

## 2022-04-02 ENCOUNTER — Encounter: Payer: Self-pay | Admitting: Plastic Surgery

## 2022-04-02 VITALS — BP 116/79 | HR 70

## 2022-04-02 DIAGNOSIS — L987 Excessive and redundant skin and subcutaneous tissue: Secondary | ICD-10-CM | POA: Diagnosis not present

## 2022-04-02 DIAGNOSIS — Z9889 Other specified postprocedural states: Secondary | ICD-10-CM

## 2022-04-02 NOTE — Progress Notes (Signed)
Procedure Note  Preoperative Dx: Excess mons soft tissue  Postoperative Dx: Same  Procedure: Excision of excess mons 3 x 6 cm  Anesthesia: Lidocaine 1% with 1:100,000 epinephrine  Description of Procedure: Risks and complications were explained to the patient.  Consent was confirmed and the patient understands the risks and benefits.  The potential complications and alternatives were explained and the patient consents.  The patient expressed understanding the option of not having the procedure and the risks of a scar.  Time out was called and all information was confirmed to be correct.    The area was prepped and drapped.  Lidocaine 1% with epinepherine was injected in the subcutaneous area.  After waiting several minutes for the local to take affect a #15 blade was used to excise the area in an eliptical pattern.  A 3-0 Monocryl was used to close the deep layers with simple interrupted stitches.  The skin edges were reapproximated with 4-0 Monocryl subcuticular running closure.  A dressing was applied.  The patient was given instructions on how to care for the area and a follow up appointment.  [name] [name] tolerated the procedure well and there were no complications. The amount was agreed upon with patient.

## 2022-04-07 NOTE — Progress Notes (Signed)
[facility] Summary Report / Loop Recorder 

## 2022-04-09 ENCOUNTER — Ambulatory Visit (INDEPENDENT_AMBULATORY_CARE_PROVIDER_SITE_OTHER): Payer: No Typology Code available for payment source

## 2022-04-09 DIAGNOSIS — R55 Syncope and collapse: Secondary | ICD-10-CM | POA: Diagnosis not present

## 2022-04-10 LAB — CUP PACEART REMOTE DEVICE CHECK
Date Time Interrogation Session: 20231203230930
Implantable Pulse Generator Implant Date: 20220216

## 2022-04-11 ENCOUNTER — Encounter (INDEPENDENT_AMBULATORY_CARE_PROVIDER_SITE_OTHER): Payer: Self-pay | Admitting: Physician Assistant

## 2022-04-11 ENCOUNTER — Other Ambulatory Visit (HOSPITAL_COMMUNITY): Payer: Self-pay

## 2022-04-11 ENCOUNTER — Ambulatory Visit (INDEPENDENT_AMBULATORY_CARE_PROVIDER_SITE_OTHER): Payer: No Typology Code available for payment source | Admitting: Physician Assistant

## 2022-04-11 VITALS — BP 97/67 | HR 58 | Temp 98.2°F | Ht 68.0 in | Wt 185.0 lb

## 2022-04-11 DIAGNOSIS — E119 Type 2 diabetes mellitus without complications: Secondary | ICD-10-CM | POA: Diagnosis not present

## 2022-04-11 DIAGNOSIS — Z7985 Long-term (current) use of injectable non-insulin antidiabetic drugs: Secondary | ICD-10-CM

## 2022-04-11 DIAGNOSIS — E1169 Type 2 diabetes mellitus with other specified complication: Secondary | ICD-10-CM | POA: Diagnosis not present

## 2022-04-11 DIAGNOSIS — E559 Vitamin D deficiency, unspecified: Secondary | ICD-10-CM

## 2022-04-11 DIAGNOSIS — R0602 Shortness of breath: Secondary | ICD-10-CM

## 2022-04-11 DIAGNOSIS — R110 Nausea: Secondary | ICD-10-CM

## 2022-04-11 DIAGNOSIS — E669 Obesity, unspecified: Secondary | ICD-10-CM

## 2022-04-11 DIAGNOSIS — E538 Deficiency of other specified B group vitamins: Secondary | ICD-10-CM

## 2022-04-11 DIAGNOSIS — E785 Hyperlipidemia, unspecified: Secondary | ICD-10-CM

## 2022-04-11 DIAGNOSIS — Z6834 Body mass index (BMI) 34.0-34.9, adult: Secondary | ICD-10-CM

## 2022-04-11 DIAGNOSIS — Z6828 Body mass index (BMI) 28.0-28.9, adult: Secondary | ICD-10-CM

## 2022-04-11 MED ORDER — TIRZEPATIDE 7.5 MG/0.5ML ~~LOC~~ SOAJ
7.5000 mg | SUBCUTANEOUS | 2 refills | Status: DC
  Filled 2022-04-11 (×2): qty 2, 28d supply, fill #0
  Filled 2022-04-11: qty 6, 84d supply, fill #0
  Filled 2022-05-02: qty 2, 28d supply, fill #1

## 2022-04-11 MED ORDER — ONDANSETRON 4 MG PO TBDP
4.0000 mg | ORAL_TABLET | Freq: Three times a day (TID) | ORAL | 0 refills | Status: DC | PRN
  Filled 2022-04-11: qty 20, 7d supply, fill #0

## 2022-04-11 MED ORDER — VITAMIN D (ERGOCALCIFEROL) 1.25 MG (50000 UNIT) PO CAPS
50000.0000 [IU] | ORAL_CAPSULE | ORAL | 0 refills | Status: DC
  Filled 2022-04-11: qty 8, 56d supply, fill #0

## 2022-04-12 LAB — CMP14+EGFR
ALT: 10 IU/L (ref 0–32)
AST: 11 IU/L (ref 0–40)
Albumin/Globulin Ratio: 1.5 (ref 1.2–2.2)
Albumin: 4.4 g/dL (ref 3.9–4.9)
Alkaline Phosphatase: 67 IU/L (ref 44–121)
BUN/Creatinine Ratio: 23 (ref 9–23)
BUN: 21 mg/dL (ref 6–24)
Bilirubin Total: 0.4 mg/dL (ref 0.0–1.2)
CO2: 22 mmol/L (ref 20–29)
Calcium: 9.6 mg/dL (ref 8.7–10.2)
Chloride: 102 mmol/L (ref 96–106)
Creatinine, Ser: 0.91 mg/dL (ref 0.57–1.00)
Globulin, Total: 2.9 g/dL (ref 1.5–4.5)
Glucose: 79 mg/dL (ref 70–99)
Potassium: 4.6 mmol/L (ref 3.5–5.2)
Sodium: 140 mmol/L (ref 134–144)
Total Protein: 7.3 g/dL (ref 6.0–8.5)
eGFR: 81 mL/min/{1.73_m2} (ref >59)

## 2022-04-12 LAB — LIPID PANEL WITH LDL/HDL RATIO
Cholesterol, Total: 208 mg/dL — ABNORMAL HIGH (ref 100–199)
HDL: 60 mg/dL (ref >39)
LDL Chol Calc (NIH): 133 mg/dL — ABNORMAL HIGH (ref 0–99)
LDL/HDL Ratio: 2.2 ratio (ref 0.0–3.2)
Triglycerides: 86 mg/dL (ref 0–149)
VLDL Cholesterol Cal: 15 mg/dL (ref 5–40)

## 2022-04-12 LAB — CBC WITH DIFFERENTIAL/PLATELET
Basophils Absolute: 0.1 10*3/uL (ref 0.0–0.2)
Basos: 1 %
EOS (ABSOLUTE): 0.2 10*3/uL (ref 0.0–0.4)
Eos: 2 %
Hematocrit: 42.7 % (ref 34.0–46.6)
Hemoglobin: 14.2 g/dL (ref 11.1–15.9)
Immature Grans (Abs): 0 10*3/uL (ref 0.0–0.1)
Immature Granulocytes: 0 %
Lymphocytes Absolute: 1.8 10*3/uL (ref 0.7–3.1)
Lymphs: 28 %
MCH: 29.2 pg (ref 26.6–33.0)
MCHC: 33.3 g/dL (ref 31.5–35.7)
MCV: 88 fL (ref 79–97)
Monocytes Absolute: 0.4 10*3/uL (ref 0.1–0.9)
Monocytes: 6 %
Neutrophils Absolute: 4.2 10*3/uL (ref 1.4–7.0)
Neutrophils: 63 %
Platelets: 271 10*3/uL (ref 150–450)
RBC: 4.87 x10E6/uL (ref 3.77–5.28)
RDW: 11.8 % (ref 11.7–15.4)
WBC: 6.6 10*3/uL (ref 3.4–10.8)

## 2022-04-12 LAB — HEMOGLOBIN A1C
Est. average glucose Bld gHb Est-mCnc: 103 mg/dL
Hgb A1c MFr Bld: 5.2 % (ref 4.8–5.6)

## 2022-04-12 LAB — VITAMIN B12: Vitamin B-12: 587 pg/mL (ref 232–1245)

## 2022-04-12 LAB — TSH: TSH: 1.27 u[IU]/mL (ref 0.450–4.500)

## 2022-04-12 LAB — VITAMIN D 25 HYDROXY (VIT D DEFICIENCY, FRACTURES): Vit D, 25-Hydroxy: 42.9 ng/mL (ref 30.0–100.0)

## 2022-04-12 LAB — INSULIN, RANDOM: INSULIN: 6.9 u[IU]/mL (ref 2.6–24.9)

## 2022-04-16 ENCOUNTER — Other Ambulatory Visit (HOSPITAL_COMMUNITY): Payer: Self-pay

## 2022-04-17 ENCOUNTER — Ambulatory Visit (INDEPENDENT_AMBULATORY_CARE_PROVIDER_SITE_OTHER): Payer: No Typology Code available for payment source | Admitting: Physician Assistant

## 2022-04-17 ENCOUNTER — Encounter: Payer: Self-pay | Admitting: Physician Assistant

## 2022-04-17 VITALS — BP 101/65 | HR 81

## 2022-04-17 DIAGNOSIS — N906 Unspecified hypertrophy of vulva: Secondary | ICD-10-CM

## 2022-04-17 DIAGNOSIS — M793 Panniculitis, unspecified: Secondary | ICD-10-CM

## 2022-04-17 NOTE — Progress Notes (Signed)
[name] [name] is a very pleasant 41 year old female seen in our office for postop follow-up status post excision of excess mons 04/02/2022 by Dr. Marla Roe here in the clinic.  The patient did very well, she notes she is very happy with her outcome.  She denies any infectious signs or symptoms, she denies pain.   Chaperone present.  On exam incision is clean dry and intact, no swelling or edema, no redness warmth to touch no discharge, no wounds.  Nontender to palpation no palpable fluid collections or area of firmness.  Photos taken today  Overall the patient is very happy with her surgical outcome.  There was discussion that she wanted excess tissue from the labia removed, she did follow-up with her GYN and was given referrals.  We will also provide her with a referral for Dr. Veda Canning at [facility].  At this time the patient does not need any formal follow-up with [place], we are happy to see her in the clinic at any point, she is happy with this plan.  She was given return precautions.  She verbalized understanding and agreement to this plan had no further questions or concerns.

## 2022-04-18 ENCOUNTER — Telehealth: Payer: Self-pay

## 2022-04-18 NOTE — Progress Notes (Deleted)
Cardiology Office Note Date:  04/18/2022  Patient ID:  [name], [name] 04/27/1981, MRN [id number] PCP:  Briscoe Deutscher, DO  Cardiologist:  Dr. Harrell Gave Electrophysiologist: Dr. Lovena Le    Chief Complaint:  *** planned f/u  History of Present Illness: [name] [name] is a 41 y.o. female with history of  chronic dizziness/presyncope/syncope, PCOS, prior bariatric surgery, former tobacco use, COVID-19, Nonobstructive CAD  She comes in today to be seen for dr. Lovena Le, last seen by him May 2022, continued to struggle with dizziness, lightheaded/weak spells that could last hours.  No syncope, learning to get sitting/supine quickly with the onset of symptoms. Loop noted some ST, "no long pauses", started on Florinef  Developed nausea and florinef stopped  Following with cardiology team  Most recently C. Walker, NP 10/04/21,  noted prior evaluation on 12/12/2020 by neurology.  Vertigo was easily provoked in clinic with rapid head movements.  She was recommended for repeat sleep study. She had orthostatic vital signs which were positive.  01/31/21 she was started on midodrine 2.'5mg'$  TID. Did not note improvement in symptoms and discontinued.   At the time of this visit she was s/p abdominoplasty with liposuction of mons pubis about 6 weeks, having some post op symptoms including some edema, using some compression stockings. Episodes have overall been less bothersome than previous.  Still with FMLA for up to 3 days of work per month.  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 Planned to resume her statin  Pt reached out requesting FMLA forms be filled out  11/22/21, called reporting dizziness with slow HRs observed, advised to maintain adequate hydration, wear her compression stockings and being seen by EP to review HR data/loop recorder  I saw her 11/28/21 She has had an up tick in symptoms of late and with her pulse Ox noted some HRs to 38-40's and the behavior of her symptoms with  these are different, take longer to recover from. No syncope Here in the waiting room she had a slight episodes and made a symptom episode. She had one this Am as well Sometimes an awareness of her heart beating, not always Episode are mostly the same she will start with ringing in her ears, vision starts to get tunneled, feels weak, lightheaded and then has to get to seated or supine, feels a bit shaky after wards No CP No SOB Parameters on her loop adjusted brady to <50 and pause detection >3 sec  subsequently A number of calls with c/o CP, dizziness,  racing heart, mentioned stressful days at work. Following with plastic sx for panniculitis Follows with weight and wellness clinic Has seen GYN   Saw Dr. Clarice Pole, note is incomplete, ongoing symptoms, coronary CT was reassuring, noted that she had symptoms with some bradycardia on her loop but the worst of the symptoms were not. Urged to stay hydrated  Ongoing calls with continued symptoms, rates 47-50, HR histograms very good and advised to come in.  I saw her 01/26/22 Only once did she have sense of racing heart beats, this woke her from sleep, lasted a few minutes, NOT associated with any other symptoms. No tachy episodes noted Her dizzy/lightheaded spells always occur the same: She can be seated, more often standing/up and about She starts to feel a fullness or ringing in her ars, then a sense of weakness, lightheaded, and will sit or lay down, put her feet up and will pass. She has not fallen or fainted since she was here.  When able to, she puts her pulse ox on and HRs typically 48-50's with these and seems to connect the symptoms to slow HRs She had bariatric surgery (gastric sleeve) in 2018 and as she has lost weight her symptoms started and have progressed in frequency and severity She started 330lb > about 214 in the 1st few years, leveled off and now again on weight loss journey > 180's fairly steadily now. Frustrating,  because she feels worse now then she did at her heaviest. She has had a couple fleeting palpitations, these are brief and otherwise without symptoms. She has had a dull persistent Headache for about 10 days, no t escalating or changing, but wont go away.  No focal neuro symptoms ILR interrogation: 202 brady episodes with <50bpm setting, ALL 47bpm or higher  35 symptom episodes One pause episode (false for signal loss) Available EGMs reviewed Symptom episode tracing available was associated with dizziness, lightheaded, HRs were good 60''s-70's No heart block No rates <47 I did not think her symptoms were HR driven, she had good HR histograms/variability Brady parameter adjusted to <40 She will continue to make symptom transmissions She wears torso compression wear already every day She could maybe do a little more with sodium, discussed this balance  She saw Dr. Harrell Gave 02/06/22, looks like discussed strategies to combat orthostatic symptoms/issues.  *** symptoms *** HRs, rhythm   Device information MDT LINQ II, implanted 06/22/2020   Past Medical History:  Diagnosis Date   Abnormal uterine bleeding    Amenorrhea    Anemia    Anxiety    panic attacks   Back pain    Bradycardia    Chest pain    COVID 02/2020   Diabetes ([place])    h/o   Dysmenorrhea    Endometriosis    Gallbladder problem    GERD (gastroesophageal reflux disease)    occasional - diet controlled and tums prn   HLD (hyperlipidemia)    HTN (hypertension)    IBS (irritable bowel syndrome)    Infertility, female    Insulin resistance    Migraine without aura    Missed abortion    x 2 - both resolved without surgery   Obesity    PCOS (polycystic ovarian syndrome)    PONV (postoperative nausea and vomiting)    pe rpatient ; scopalamine patch very helpful    SVD (spontaneous vaginal delivery)    x 1   Syncope and collapse 06/21/2020   Urinary incontinence    Vitamin D deficiency     Past Surgical  History:  Procedure Laterality Date   ABDOMINOPLASTY/PANNICULECTOMY WITH LIPOSUCTION N/A 08/24/2021   Procedure: ABDOMINOPLASTY/PANNICULECTOMY WITH LIPOSUCTION, LIPOSUCTION OF MONS PUBIS;  Surgeon: Wallace Going, DO;  Location: [facility];  Service: Plastics;  Laterality: N/A;   CHOLECYSTECTOMY     COLONOSCOPY  2017   polyps   CYSTOSCOPY N/A 10/29/2016   Procedure: CYSTOSCOPY;  Surgeon: Salvadore Dom, MD;  Location: [facility];  Service: Gynecology;  Laterality: N/A;   DILATATION & CURRETTAGE/HYSTEROSCOPY WITH RESECTOCOPE N/A 10/17/2012   Procedure: DILATATION & CURETTAGE/HYSTEROSCOPY WITH RESECTOCOPE; Possible Polypectomy, Possible Resectoscopic Myomectomy.;  Surgeon: Floyce Stakes. Pamala Hurry, MD;  Location: [facility];  Service: Gynecology;  Laterality: N/A;  1 hr.   DILATION AND CURETTAGE OF UTERUS     ENDOMETRIAL BIOPSY     ENDOVENOUS ABLATION SAPHENOUS VEIN W/ LASER Right 05/08/2018   endovenous laser ablation right greater saphenous vein and stab phlebectomy > 20 incisions right  leg by Deitra Mayo MD    ESOPHAGOGASTRODUODENOSCOPY ENDOSCOPY  2017   Normal   HYSTEROSCOPY WITH D & C N/A 12/01/2013   Procedure: IUD Removal;  Surgeon: Floyce Stakes. Pamala Hurry, MD;  Location: [facility];  Service: Gynecology;  Laterality: N/A;  90 min.   INTRAUTERINE DEVICE (IUD) INSERTION N/A 10/17/2012   Procedure: INTRAUTERINE DEVICE (IUD) INSERTION; Mirena;  Surgeon: Floyce Stakes. Pamala Hurry, MD;  Location: [facility];  Service: Gynecology;  Laterality: N/A;   LAPAROSCOPIC GASTRIC SLEEVE RESECTION N/A 02/19/2017   Procedure: LAPAROSCOPIC GASTRIC SLEEVE RESECTION, UPPER ENDOSCOPY;  Surgeon: Johnathan Hausen, MD;  Location: [facility];  Service: General;  Laterality: N/A;   LAPAROSCOPIC SALPINGO OOPHERECTOMY Left 10/29/2016   Procedure: LAPAROSCOPIC SALPINGO OOPHORECTOMY With anterior uterine biopsy;  Surgeon: Salvadore Dom, MD;  Location: [facility];  Service: Gynecology;  Laterality: Left;   LASIK  10/06/2018    LOOP RECORDER INSERTION N/A 06/22/2020   Procedure: LOOP RECORDER INSERTION;  Surgeon: Evans Lance, MD;  Location: [facility];  Service: Cardiovascular;  Laterality: N/A;   MOUTH SURGERY     wisdom teeth    Current Outpatient Medications  Medication Sig Dispense Refill   APPLE CIDER VINEGAR PO Take 1 tablet by mouth daily.     aspirin EC 81 MG tablet Take 81 mg by mouth every other day. Swallow whole.     Biotin w/ Vitamins C & E (HAIR/SKIN/NAILS PO) Take 1 tablet by mouth daily.      clonazePAM (KLONOPIN) 0.5 MG tablet Take 1 tablet by mouth 2  times daily as needed for anxiety. 20 tablet 1   cyanocobalamin (VITAMIN B12) 1000 MCG/ML injection Inject 1 ml once every other week 30 mL 0   levonorgestrel (MIRENA) 20 MCG/24HR IUD 1 each by Intrauterine route once.     loratadine (CLARITIN) 10 MG tablet Take 10 mg by mouth daily as needed for allergies.     Multiple Vitamins-Minerals (BARIATRIC MULTIVITAMINS/IRON PO) Take 1 tablet by mouth in the morning and at bedtime.     ondansetron (ZOFRAN-ODT) 4 MG disintegrating tablet Dissolve 1 tablet by mouth every 8 hours as needed for nausea or vomiting. 20 tablet 0   Probiotic Product (PROBIOTIC ADVANCED PO) Take 1 capsule by mouth daily.      rosuvastatin (CRESTOR) 10 MG tablet Take 1 tablet by mouth daily. 90 tablet 1   SUMAtriptan (IMITREX) 50 MG tablet TAKE 1 TABLET BY MOUTH AT ONSET OF MIGRAINE, MAY REPEAT IN 2 HOURS IF HEADACHE PERSISTS 10 tablet 0   SYRINGE-NEEDLE, DISP, 3 ML (B-D 3CC LUER-LOK SYR 25GX5/8") 25G X 5/8" 3 ML MISC Use to inject b12 every other week. 12 each 0   tirzepatide (MOUNJARO) 5 MG/0.5ML Pen Inject 5 mg into the skin once a week. 18 mL 0   tirzepatide (MOUNJARO) 7.5 MG/0.5ML Pen Inject 7.5 mg into the skin once a week. 6 mL 2   Vitamin D, Ergocalciferol, (DRISDOL) 1.25 MG (50000 UNIT) CAPS capsule Take 1 capsule (50,000 Units total) by mouth every 7 (seven) days. 8 capsule 0   No current facility-administered  medications for this visit.    Allergies:   Iohexol and Pyridium [phenazopyridine hcl]   Social History:  The patient  reports that she quit smoking about 5 years ago. Her smoking use included cigarettes. She has a 4.75 pack-year smoking history. She has never used smokeless tobacco. She reports current alcohol use. She reports that she does not use drugs.   Family History:  The patient's family history includes Alcohol abuse in her paternal grandfather; Breast cancer in her paternal aunt; Colon cancer in her maternal uncle; Dementia in her father; Depression in her mother; Diabetes in her father, maternal grandfather, maternal grandmother, mother, paternal grandfather, and paternal grandmother; Heart disease in her father, maternal grandfather, maternal grandmother, mother, and paternal grandfather; Hyperlipidemia in her father; Hypertension in her father and mother; Obesity in her father and mother; Parkinson's disease in her father; Sleep apnea in her father; Stroke in her maternal grandfather and paternal grandfather; Thyroid disease in her father.  ROS:  Please see the history of present illness.    All other systems are reviewed and otherwise negative.   PHYSICAL EXAM:  VS:  There were no vitals taken for this visit. BMI: There is no height or weight on file to calculate BMI. Well nourished, well developed, in no acute distress HEENT: normocephalic, atraumatic Neck: no JVD, carotid bruits or masses Cardiac:  *** RRR; no significant murmurs, no rubs, or gallops Lungs: *** CTA b/l, no wheezing, rhonchi or rales Abd: soft, nontender MS: no deformity or atrophy Ext: *** no edema Skin: warm and dry, no rash Neuro:  No gross deficits appreciated Psych: euthymic mood, full affect  *** ILR site is stable, no tethering or discomfort   EKG:  not done today   Device interrogation done today and reviewed by myself:  ***  12/25/2021: Coronary CT IMPRESSION: 1. Coronary artery calcium  score 67.6 Agatston units. This places the patient in the 99th percentile for age and gender, suggesting high risk for future cardiac events.   2.  Mild nonobstructive CAD.   [place] LE Venous 08/26/2020: Summary:  Right:  - No evidence of deep vein thrombosis from the common femoral through the  popliteal veins.  - No evidence of superficial venous thrombosis.  - The common femoral vein is not competent.  - The great saphenous vein is not competent at the knee and proximal calf.  - The small saphenous vein is competent.     Left:  - No evidence of deep vein thrombosis from the common femoral through the  popliteal veins.  - No evidence of superficial venous thrombosis.  - The deep venous system is not competent.  - The great saphenous vein is ablated proximally and competent distally.  - The small saphenous vein is not competent.   Echo 06/22/2020: 1. Left ventricular ejection fraction, by estimation, is 60 to 65%. The  left ventricle has normal function. The left ventricle has no regional  wall motion abnormalities. There is mild left ventricular hypertrophy.  Left ventricular diastolic parameters  were normal.   2. Right ventricular systolic function is normal. The right ventricular  size is normal. Tricuspid regurgitation signal is inadequate for assessing  PA pressure.   3. The mitral valve is normal in structure. No evidence of mitral valve  regurgitation.   4. The aortic valve was not well visualized. Aortic valve regurgitation  is not visualized. No aortic stenosis is present.   5. The inferior vena cava is normal in size with greater than 50%  respiratory variability, suggesting right atrial pressure of 3 mmHg.    Monitor 11/23/2019: 12 days of data recorded on Zio monitor. Patient had a min HR of 37 bpm, max HR of 180 bpm, and avg HR of 67 bpm. Predominant underlying rhythm was Sinus Rhythm. No VT, SVT, atrial fibrillation, high degree block, or pauses noted. Isolated  atrial and ventricular ectopy was rare (<  1%). There were 91 triggered events. These were all sinus, with occasional isolate PAC or PVC. No significant arrhythmias detected.   Monitor 04/21/18 NSR Rare PAC/PVC s Symptoms do not correlate with arrhythmia Average HR 69 bpm   CT coronary 03/23/18 Coronary Arteries:  Normal coronary origin.  Right dominance.   RCA is a large dominant artery that gives rise to PDA and PLVB. There is no plaque.   Left main is a large artery that gives rise to LAD and LCX arteries. Left main has no plaque.   LAD is a large vessel that gives rise to two diagonal arteries. Proximal LAD has mild calcified plaque with associated stenosis 0-25%. Mid LAD has a mild non-calcified plaque with associated stenosis 0-25%.   LCX is a small non-dominant artery that gives rise to one small OM1 branch. There is no plaque.   Other findings:   Normal pulmonary vein drainage into the left atrium.   Normal let atrial appendage without a thrombus.   IMPRESSION: 1. Coronary calcium score of 46. This was 48 percentile for age and sex matched control.   2. Normal coronary origin with right dominance.   3. 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 Aggressive medical management is recommended.   4. Mildly dilated pulmonary artery measuring 32 mm suggestive of pulmonary hypertension.  Recent Labs: 04/11/2022: ALT 10; BUN 21; Creatinine, Ser 0.91; Hemoglobin 14.2; Platelets 271; Potassium 4.6; Sodium 140; TSH 1.270  06/28/2021: Chol/HDL Ratio 3.3 04/11/2022: Cholesterol, Total 208; HDL 60; LDL Chol Calc (NIH) 133; Triglycerides 86   Estimated Creatinine Clearance: 92.3 mL/min (by C-G formula based on SCr of 0.91 mg/dL).   Wt Readings from Last 3 Encounters:  04/11/22 185 lb (83.9 kg)  03/27/22 188 lb 3.2 oz (85.4 kg)  02/13/22 185 lb (83.9 kg)     Other studies reviewed: Additional studies/records reviewed today include: summarized above  ASSESSMENT  AND PLAN:  Dizziness, weak spells  *** Suspect perhaps component of autonomic dysfunction Onset s/p bariatric surgery and worse the more weight she loses.  *** I do not think her symptoms are HR driven, HR histograms look good, would not expect HR 47 or better to give her symptoms, and she seems to have HRs here as well without symptoms    Disposition: ***  Current medicines are reviewed at length with the patient today.  The patient did not have any concerns regarding medicines.  Venetia Night, PA-C 04/18/2022 7:42 AM     [address] [place] [id number] [phone] (office)  [phone] (fax)

## 2022-04-18 NOTE — Telephone Encounter (Signed)
Faxed referral to Dr. Trellis Moment, MD, [facility] for removal of excessive tissue of the labia.

## 2022-04-20 ENCOUNTER — Encounter: Payer: No Typology Code available for payment source | Admitting: Physician Assistant

## 2022-04-24 NOTE — Addendum Note (Signed)
Addended byPenni Bombard, Charmaine Placido on: 04/24/2022 08:41 AM   Modules accepted: Orders

## 2022-04-25 NOTE — Progress Notes (Signed)
Chief Complaint:   OBESITY [name] [name] is here to discuss her progress with her obesity treatment plan along with follow-up of her obesity related diagnoses. [name] [name] is on the Category 1 Plan and states she is following her eating plan approximately 85% of the time. [name] [name] states she is cardio 45 minutes 3-4 times per week.  Today's visit was #: 28 Starting weight: 243 lbs Starting date: 06/18/2019 Today's weight: 185 lbs Today's date: 04/11/2022 Total lbs lost to date: 58 lbs Total lbs lost since last in-office visit: 0  Interim History: [name] [name] has done overall with weight loss--S/P abdominoplasty revision 04/02/22. Breakfast 3 sausage/ 2 boiled eggs. Lunch- High protein shake, cheese sticks/cottage cheese for snacks. Limited with activities right now as recovering from abdominoplasty. Reports increase in cravings/ increased hunger lately. On Mounjaro 5 mg weekly. No side effects with Mounjaro.   Subjective:   1. SOB (shortness of breath) on exertion IC today shows V02 2.42 ml/kg/min and RMR 1411--previously RMR on 09/07/20, 2023.  IC has decreased by 612 calories.   2. Vitamin D deficiency Currently taking prescription Vit D 50,000 IU once a week.   3. Type 2 diabetes mellitus without complication, without long-term current use of insulin (HCC) [name] [name] is taking Mounjaro 5 mg. Denies any side effects. Notes increased cravings and hunger.   4. Hyperlipidemia associated with type 2 diabetes mellitus ([place]) [name] [name] is taking Crestor 10 mg daily--Denies any side effects.  5. Nausea Reports some intermittent, rare nausea. Taking Zofran without side effect and good results with resolution of nausea.  6. B12 deficiency [name] [name] notes some fatigue--but also recent surgery which may be contributing to her fatigue.  Assessment/Plan:   1. SOB (shortness of breath) on exertion We will obtain labs today. Discussed RMR significant decrease from previous by 612 calories-May be effected by  recent surgery with decreased activity as well. Continue healthy eating plan to decrease simple carbohydrates, decrease saturated fats, increase lean protein and exercise as able to promote weight loss. Will check lab today :  - TSH  2. Vitamin D deficiency We will refill Vit D 50,000 IU once a week for 1 month with 0 refills. Continue vitamin D 50,000  IU once weekly.   -Refill Vitamin D, Ergocalciferol, (DRISDOL) 1.25 MG (50000 UNIT) CAPS capsule; Take 1 capsule (50,000 Units total) by mouth every 7 (seven) days.  Dispense: 8 capsule; Refill: 0  3. Type 2 diabetes mellitus without complication, without long-term current use of insulin (HCC) We will obtain labs today. Increase/refill Mounjaro 7.5 mg once weekly for 1 month with 0 refills. Continue healthy eating plan to decrease simple carbohydrates, decrease saturated fats, increase lean protein and exercise as able to promote weight loss.  -Increase/refill tirzepatide (MOUNJARO) 7.5 MG/0.5ML Pen; Inject 7.5 mg into the skin once a week.  Dispense: 6 mL; Refill: 2   Recheck Fasting labs today:  - CMP14+EGFR - Hemoglobin A1c - Insulin, random - VITAMIN D 25 Hydroxy (Vit-D Deficiency, Fractures)  4. Hyperlipidemia associated with type 2 diabetes mellitus ([place]) We will obtain labs today. Continue healthy eating plan to decrease simple carbohydrates, decrease saturated fats, increase lean protein and exercise as able to promote weight loss.  - Recheck fasting Lipid Panel With LDL/HDL Ratio  5. Nausea We will refill Zofran 4 mg as needed for 1 month with 0 refills.  -Refill ondansetron (ZOFRAN-ODT) 4 MG disintegrating tablet; Dissolve 1 tablet by mouth every 8 hours as needed for nausea or vomiting.  Dispense: 20 tablet;  Refill: 0  6. B12 deficiency We will obtain labs today. Continue healthy eating plan and exercise.  - Vitamin B12 - CBC with Differential/Platelet  7. Obesity, current BMI 28.2 [name] [name] is currently in the action  stage of change. As such, her goal is to continue with weight loss efforts. She has agreed to the Category 1 Plan +100 grams of protein daily.   Exercise goals: As is. Activity limited by recent Abdominoplasty revision surgery.  Behavioral modification strategies: increasing lean protein intake and increasing water intake.  [name] [name] has agreed to follow-up with our clinic in 4 weeks. She was informed of the importance of frequent follow-up visits to maximize her success with intensive lifestyle modifications for her multiple health conditions.   [name] [name] was informed we would discuss her lab results at her next visit unless there is a critical issue that needs to be addressed sooner. [name] [name] agreed to keep her next visit at the agreed upon time to discuss these results.  Objective:   Blood pressure 97/67, pulse (!) 58, temperature 98.2 F (36.8 C), height _0  (1.727 m), weight 185 lb (83.9 kg), SpO2 100 %. Body mass index is 28.13 kg/m.  General: Cooperative, alert, well developed, in no acute distress. HEENT: Conjunctivae and lids unremarkable. Cardiovascular: Regular rhythm.  Lungs: Normal work of breathing. Neurologic: No focal deficits.   Lab Results  Component Value Date   CREATININE 0.91 04/11/2022   BUN 21 04/11/2022   NA 140 04/11/2022   K 4.6 04/11/2022   CL 102 04/11/2022   CO2 22 04/11/2022   Lab Results  Component Value Date   ALT 10 04/11/2022   AST 11 04/11/2022   ALKPHOS 67 04/11/2022   BILITOT 0.4 04/11/2022   Lab Results  Component Value Date   HGBA1C 5.2 04/11/2022   HGBA1C 5.7 (H) 12/06/2021   HGBA1C 5.2 06/28/2021   HGBA1C 5.7 (H) 09/23/2020   HGBA1C 5.8 (H) 08/18/2020   Lab Results  Component Value Date   INSULIN 6.9 04/11/2022   INSULIN 11.0 08/18/2020   INSULIN 11.9 10/14/2019   INSULIN 10.1 06/18/2019   Lab Results  Component Value Date   TSH 1.270 04/11/2022   Lab Results  Component Value Date   CHOL 208 (H) 04/11/2022   HDL 60  04/11/2022   LDLCALC 133 (H) 04/11/2022   LDLDIRECT 141 (H) 09/23/2020   TRIG 86 04/11/2022   CHOLHDL 3.3 06/28/2021   Lab Results  Component Value Date   VD25OH 42.9 04/11/2022   VD25OH 58.7 12/06/2021   VD25OH 83.7 06/28/2021   Lab Results  Component Value Date   WBC 6.6 04/11/2022   HGB 14.2 04/11/2022   HCT 42.7 04/11/2022   MCV 88 04/11/2022   PLT 271 04/11/2022   Lab Results  Component Value Date   IRON 69 07/05/2021   TIBC 267 07/05/2021   FERRITIN 65 07/05/2021   Attestation Statements:   Reviewed by clinician on day of visit: allergies, medications, problem list, medical history, surgical history, family history, social history, and previous encounter notes.  I, Brendell Tyus, am acting as transcriptionist for [employer], PA.  I have reviewed the above documentation for accuracy and completeness, and I agree with the above. -  Takeira Yanes,PA-C

## 2022-05-01 ENCOUNTER — Telehealth: Payer: Self-pay | Admitting: Physician Assistant

## 2022-05-01 NOTE — Telephone Encounter (Signed)
I spoke with Ms. Rueter today, she was given the information for Dr. Helane Rima and will be reaching out to her.

## 2022-05-02 ENCOUNTER — Other Ambulatory Visit (INDEPENDENT_AMBULATORY_CARE_PROVIDER_SITE_OTHER): Payer: Self-pay | Admitting: Adult Health

## 2022-05-02 ENCOUNTER — Other Ambulatory Visit (HOSPITAL_COMMUNITY): Payer: Self-pay

## 2022-05-02 ENCOUNTER — Other Ambulatory Visit: Payer: Self-pay

## 2022-05-02 DIAGNOSIS — E119 Type 2 diabetes mellitus without complications: Secondary | ICD-10-CM

## 2022-05-03 ENCOUNTER — Other Ambulatory Visit (HOSPITAL_COMMUNITY): Payer: Self-pay

## 2022-05-09 ENCOUNTER — Ambulatory Visit (INDEPENDENT_AMBULATORY_CARE_PROVIDER_SITE_OTHER): Payer: 59 | Admitting: Physician Assistant

## 2022-05-09 ENCOUNTER — Other Ambulatory Visit (HOSPITAL_COMMUNITY): Payer: Self-pay

## 2022-05-09 ENCOUNTER — Encounter (INDEPENDENT_AMBULATORY_CARE_PROVIDER_SITE_OTHER): Payer: Self-pay | Admitting: Physician Assistant

## 2022-05-09 VITALS — BP 97/67 | HR 60 | Temp 97.6°F | Ht 68.0 in | Wt 183.0 lb

## 2022-05-09 DIAGNOSIS — E1169 Type 2 diabetes mellitus with other specified complication: Secondary | ICD-10-CM

## 2022-05-09 DIAGNOSIS — E785 Hyperlipidemia, unspecified: Secondary | ICD-10-CM

## 2022-05-09 DIAGNOSIS — Z6827 Body mass index (BMI) 27.0-27.9, adult: Secondary | ICD-10-CM

## 2022-05-09 DIAGNOSIS — Z7985 Long-term (current) use of injectable non-insulin antidiabetic drugs: Secondary | ICD-10-CM

## 2022-05-09 DIAGNOSIS — E669 Obesity, unspecified: Secondary | ICD-10-CM | POA: Diagnosis not present

## 2022-05-09 DIAGNOSIS — F32A Depression, unspecified: Secondary | ICD-10-CM | POA: Diagnosis not present

## 2022-05-09 DIAGNOSIS — E559 Vitamin D deficiency, unspecified: Secondary | ICD-10-CM | POA: Diagnosis not present

## 2022-05-09 DIAGNOSIS — F419 Anxiety disorder, unspecified: Secondary | ICD-10-CM | POA: Diagnosis not present

## 2022-05-09 DIAGNOSIS — Z9889 Other specified postprocedural states: Secondary | ICD-10-CM

## 2022-05-09 DIAGNOSIS — E119 Type 2 diabetes mellitus without complications: Secondary | ICD-10-CM | POA: Diagnosis not present

## 2022-05-09 DIAGNOSIS — E538 Deficiency of other specified B group vitamins: Secondary | ICD-10-CM

## 2022-05-09 MED ORDER — VITAMIN D (ERGOCALCIFEROL) 1.25 MG (50000 UNIT) PO CAPS
50000.0000 [IU] | ORAL_CAPSULE | ORAL | 0 refills | Status: DC
  Filled 2022-05-09 – 2022-06-04 (×2): qty 8, 56d supply, fill #0

## 2022-05-09 MED ORDER — "BD LUER-LOK SYRINGE 25G X 5/8"" 3 ML MISC"
1.0000 | 0 refills | Status: DC
  Filled 2022-05-09 – 2022-08-28 (×2): qty 12, 84d supply, fill #0

## 2022-05-09 MED ORDER — TIRZEPATIDE 7.5 MG/0.5ML ~~LOC~~ SOAJ
7.5000 mg | SUBCUTANEOUS | 2 refills | Status: DC
  Filled 2022-05-09 – 2022-06-04 (×2): qty 6, 84d supply, fill #0
  Filled 2022-08-28: qty 2, 28d supply, fill #1
  Filled 2022-09-07: qty 6, 84d supply, fill #1

## 2022-05-09 MED ORDER — CYANOCOBALAMIN 1000 MCG/ML IJ SOLN
1000.0000 ug | INTRAMUSCULAR | 0 refills | Status: DC
  Filled 2022-05-09: qty 6, 84d supply, fill #0
  Filled 2022-06-04: qty 30, 84d supply, fill #0

## 2022-05-10 ENCOUNTER — Ambulatory Visit (HOSPITAL_BASED_OUTPATIENT_CLINIC_OR_DEPARTMENT_OTHER): Payer: No Typology Code available for payment source | Admitting: Cardiology

## 2022-05-14 ENCOUNTER — Ambulatory Visit (INDEPENDENT_AMBULATORY_CARE_PROVIDER_SITE_OTHER): Payer: 59

## 2022-05-14 DIAGNOSIS — R55 Syncope and collapse: Secondary | ICD-10-CM | POA: Diagnosis not present

## 2022-05-16 LAB — CUP PACEART REMOTE DEVICE CHECK
Date Time Interrogation Session: 20240107231715
Implantable Pulse Generator Implant Date: 20220216

## 2022-05-18 NOTE — Progress Notes (Signed)
[facility] Summary Report / Loop Recorder

## 2022-05-21 NOTE — Progress Notes (Signed)
Chief Complaint:   OBESITY [name] [name] is here to discuss her progress with her obesity treatment plan along with follow-up of her obesity related diagnoses. [name] [name] is on the Category 1 Plan and states she is following her eating plan approximately 60% of the time. [name] [name] states she is exercising 0 minutes 0 times per week.  Today's visit was #: 19 Starting weight: 243 lbs Starting date: 06/18/2019 Today's weight: 183 lbs Today's date: 05/09/2022 Total lbs lost to date: 60 lbs Total lbs lost since last in-office visit: 2  Interim History: [name] [name] has done well with weight loss.  She had a recent COVID infection (diagnosed 04/17/2022) and reports that she is improving but continues to have some GI upset/diarrhea.  Her respiratory symptoms are overall slowly improving as well. She reports some mild to occasional moderate nausea with Mounjaro but feels this is partially related to the GI upset she has had with the recent COVID infection. She has had to supplement with protein shakes in order to meet her protein goals. She has been trying to consistently hydrate well supplementing with sugar-free Gatorade or other beverages.  Subjective:   1. Type 2 diabetes mellitus without complication, without long-term current use of insulin ([name] [name]) Labs discussed during visit today.  A1c at 5.2/insulin at 6.9-at goal.-She has had some nausea with increased dose Mounjaro to 7.5 mg weekly.  Also GI upset with COVID infection.  Doing better over the past week-monitor closely-she has been taking Zofran as needed with improvement in her nausea.  2. B12 deficiency Labs discussed during visit today.  B12 level of 587-much improved.  On B12 1000 mcg injection once weekly.  3. Vitamin D deficiency Labs discussed during visit today.  Vitamin D level of 42.9-nearing/but not at goal.She is taking ergocalciferol once weekly.  Denies side effects.  4. H/O abdominoplasty Healing well with surgery with recent  revision.  Some adjustment to how much her body has changed.  Notes some concerns with only being able to take in 4 to 5 ounces at a time following plication of her abdominal musculature after her abdominoplasty.  We discussed that this should gradually improve over time.  5. Hyperlipidemia associated with type 2 diabetes mellitus ([name] [name]) Labs discussed during visit today.  HDL of 60- increased/trig of 86/LDL 133-stable.  Taking Crestor 10 mg daily and working on diet to decrease saturated fat and cholesterol, and decrease simple carbohydrates, and increase lean protein and exercise to promote weight loss.  6. Anxiety and depression Reports some concerns over adjusting to her changing appearance with significant weight loss and recent abdominoplasty.  She has benefited from counseling with Dr. Mallie Mussel in the past and we will plan to refer her back as she has had significant changes in her appearance and is concerned about adjusting to these changes.    Assessment/Plan:   1. Type 2 diabetes mellitus without complication, without long-term current use of insulin (HCC) Continue/Refill Mounjaro 7.5 SQ weekly for 1 month with 0 refills. Continue Prescribed Nutrition Plan to decrease simple carbohydrates, increase lean protein and exercise to promote weight loss.  -Refill tirzepatide (MOUNJARO) 7.5 MG/0.5ML Pen; Inject 7.5 mg into the skin once a week.  Dispense: 6 mL; Refill: 2  -Refill SYRINGE-NEEDLE, DISP, 3 ML (B-D 3CC LUER-LOK SYR 25GX5/8") 25G X 5/8" 3 ML MISC; Use to inject b12 every other week.  Dispense: 12 each; Refill: 0  2. B12 deficiency Continue/Refill B12 1000 mcg biweekly for 3 months with 0 refills.  -Refill  cyanocobalamin (VITAMIN B12) 1000 MCG/ML injection; Inject 1 mL (1,000 mcg total) into the skin every 14 (fourteen) days.  Dispense: 30 mL; Refill: 0  3. Vitamin D deficiency Continue/Refill ergocalciferol once weekly for 2 months with 0 refills.  -Refill Vitamin D,  Ergocalciferol, (DRISDOL) 1.25 MG (50000 UNIT) CAPS capsule; Take 1 capsule (50,000 Units total) by mouth every 7 (seven) days.  Dispense: 8 capsule; Refill: 0  4. H/O abdominoplasty She has been able to heal well from recent revision surgery. Plan to continue with vitamins and lean protein to promote healings.   5. Hyperlipidemia associated with type 2 diabetes mellitus (HCC) Continue Crestor 10 mg daily. Continue Prescribed Nutrition Plan to decrease saturated fats and cholesterol, and increase lean proteins and exercise to promote weight loss.  6. Anxiety and depression Referral to Dr. Mallie Mussel.  7. Obesity, current BMI 27.9 [name] [name] is currently in the action stage of change. As such, her goal is to continue with weight loss efforts. She has agreed to the Category 1 Plan.   Exercise goals: As is.  Behavioral modification strategies: increasing lean protein intake, decreasing simple carbohydrates, and increasing water intake.  [name] [name] has agreed to follow-up with our clinic in 4 weeks. She was informed of the importance of frequent follow-up visits to maximize her success with intensive lifestyle modifications for her multiple health conditions.   Objective:   Blood pressure 97/67, pulse 60, temperature 97.6 F (36.4 C), height '5\' 8"'$  (1.727 m), weight 183 lb (83 kg), SpO2 100 %. Body mass index is 27.83 kg/m.  General: Cooperative, alert, well developed, in no acute distress. HEENT: Conjunctivae and lids unremarkable. Cardiovascular: Regular rhythm.  Lungs: Normal work of breathing. Neurologic: No focal deficits.   Lab Results  Component Value Date   CREATININE 0.91 04/11/2022   BUN 21 04/11/2022   NA 140 04/11/2022   K 4.6 04/11/2022   CL 102 04/11/2022   CO2 22 04/11/2022   Lab Results  Component Value Date   ALT 10 04/11/2022   AST 11 04/11/2022   ALKPHOS 67 04/11/2022   BILITOT 0.4 04/11/2022   Lab Results  Component Value Date   HGBA1C 5.2 04/11/2022   HGBA1C  5.7 (H) 12/06/2021   HGBA1C 5.2 06/28/2021   HGBA1C 5.7 (H) 09/23/2020   HGBA1C 5.8 (H) 08/18/2020   Lab Results  Component Value Date   INSULIN 6.9 04/11/2022   INSULIN 11.0 08/18/2020   INSULIN 11.9 10/14/2019   INSULIN 10.1 06/18/2019   Lab Results  Component Value Date   TSH 1.270 04/11/2022   Lab Results  Component Value Date   CHOL 208 (H) 04/11/2022   HDL 60 04/11/2022   LDLCALC 133 (H) 04/11/2022   LDLDIRECT 141 (H) 09/23/2020   TRIG 86 04/11/2022   CHOLHDL 3.3 06/28/2021   Lab Results  Component Value Date   VD25OH 42.9 04/11/2022   VD25OH 58.7 12/06/2021   VD25OH 83.7 06/28/2021   Lab Results  Component Value Date   WBC 6.6 04/11/2022   HGB 14.2 04/11/2022   HCT 42.7 04/11/2022   MCV 88 04/11/2022   PLT 271 04/11/2022   Lab Results  Component Value Date   IRON 69 07/05/2021   TIBC 267 07/05/2021   FERRITIN 65 07/05/2021   Attestation Statements:   Reviewed by clinician on day of visit: allergies, medications, problem list, medical history, surgical history, family history, social history, and previous encounter notes.  I, Brendell Tyus, am acting as transcriptionist for [employer], PA.  I have reviewed the above documentation for accuracy and completeness, and I agree with the above. -  Janele Lague,PA-C

## 2022-05-23 ENCOUNTER — Other Ambulatory Visit (HOSPITAL_COMMUNITY): Payer: Self-pay

## 2022-05-23 ENCOUNTER — Ambulatory Visit (INDEPENDENT_AMBULATORY_CARE_PROVIDER_SITE_OTHER): Payer: 59 | Admitting: Nurse Practitioner

## 2022-05-23 ENCOUNTER — Encounter: Payer: Self-pay | Admitting: Nurse Practitioner

## 2022-05-23 VITALS — BP 95/67 | HR 71 | Ht 68.0 in | Wt 183.2 lb

## 2022-05-23 DIAGNOSIS — E785 Hyperlipidemia, unspecified: Secondary | ICD-10-CM | POA: Diagnosis not present

## 2022-05-23 DIAGNOSIS — G43909 Migraine, unspecified, not intractable, without status migrainosus: Secondary | ICD-10-CM | POA: Diagnosis not present

## 2022-05-23 DIAGNOSIS — F410 Panic disorder [episodic paroxysmal anxiety] without agoraphobia: Secondary | ICD-10-CM | POA: Diagnosis not present

## 2022-05-23 DIAGNOSIS — F430 Acute stress reaction: Secondary | ICD-10-CM

## 2022-05-23 DIAGNOSIS — E1169 Type 2 diabetes mellitus with other specified complication: Secondary | ICD-10-CM

## 2022-05-23 DIAGNOSIS — R55 Syncope and collapse: Secondary | ICD-10-CM

## 2022-05-23 DIAGNOSIS — E119 Type 2 diabetes mellitus without complications: Secondary | ICD-10-CM

## 2022-05-23 LAB — POCT UA - MICROALBUMIN
Albumin/Creatinine Ratio, Urine, POC: 30
Creatinine, POC: 200 mg/dL
Microalbumin Ur, POC: 30 mg/L

## 2022-05-23 MED ORDER — SUMATRIPTAN SUCCINATE 50 MG PO TABS
ORAL_TABLET | ORAL | 2 refills | Status: DC
  Filled 2022-05-23: qty 10, 30d supply, fill #0

## 2022-05-23 MED ORDER — CLONAZEPAM 0.5 MG PO TABS
0.5000 mg | ORAL_TABLET | Freq: Two times a day (BID) | ORAL | 2 refills | Status: DC | PRN
  Filled 2022-05-23: qty 45, 23d supply, fill #0

## 2022-05-23 NOTE — Progress Notes (Signed)
Established patient visit   Patient: [name] [name]   DOB: 10-25-1980   42 y.o. Female  MRN: [id number] Visit Date: 05/23/2022  Virtual Visit via Telephone Note  I connected with [name] [name] on 05/23/22 at 11:10 AM EST by telephone and verified that I am speaking with the correct person using two identifiers.  Location: Patient: home Provider: [facility]     I discussed the limitations, risks, security and privacy concerns of performing an evaluation and management service by telephone and the availability of in person appointments. I also discussed with the patient that there may be a patient responsible charge related to this service. The patient expressed understanding and agreed to proceed.    Chief Complaint  Patient presents with   Hypertension   Subjective    Hypertension Associated symptoms include headaches. Pertinent negatives include no chest pain, palpitations or shortness of breath.    Follow up  -blood pressure problems -having blood pressure and heart rate dropping in the middle of the night.  -having panic attacks.  -sees cardiology  -no known reason for this happening.  -recently had incident at work where she blacked out.  -has "aura" prior to these events  -gets terrible headache after these symptoms occur.  -gets very tired after symptoms occur. Wants to sleep for hours.  -can go months without any of these symptoms.   Medications: Outpatient Medications Prior to Visit  Medication Sig   APPLE CIDER VINEGAR PO Take 1 tablet by mouth daily.   aspirin EC 81 MG tablet Take 81 mg by mouth every other day. Swallow whole.   Biotin w/ Vitamins C & E (HAIR/SKIN/NAILS PO) Take 1 tablet by mouth daily.    cyanocobalamin (VITAMIN B12) 1000 MCG/ML injection Inject 1 mL (1,000 mcg total) into the skin every 14 (fourteen) days.   levonorgestrel (MIRENA) 20 MCG/24HR IUD 1 each by Intrauterine route once.   Multiple Vitamins-Minerals  (BARIATRIC MULTIVITAMINS/IRON PO) Take 1 tablet by mouth in the morning and at bedtime.   ondansetron (ZOFRAN-ODT) 4 MG disintegrating tablet Dissolve 1 tablet by mouth every 8 hours as needed for nausea or vomiting.   Probiotic Product (PROBIOTIC ADVANCED PO) Take 1 capsule by mouth daily.    SYRINGE-NEEDLE, DISP, 3 ML (B-D 3CC LUER-LOK SYR 25GX5/8") 25G X 5/8" 3 ML MISC Use to inject b12 every other week.   tirzepatide (MOUNJARO) 7.5 MG/0.5ML Pen Inject 7.5 mg into the skin once a week.   Vitamin D, Ergocalciferol, (DRISDOL) 1.25 MG (50000 UNIT) CAPS capsule Take 1 capsule (50,000 Units total) by mouth every 7 (seven) days.   [DISCONTINUED] clonazePAM (KLONOPIN) 0.5 MG tablet Take 1 tablet by mouth 2  times daily as needed for anxiety.   [DISCONTINUED] SUMAtriptan (IMITREX) 50 MG tablet TAKE 1 TABLET BY MOUTH AT ONSET OF MIGRAINE, MAY REPEAT IN 2 HOURS IF HEADACHE PERSISTS   rosuvastatin (CRESTOR) 10 MG tablet Take 1 tablet by mouth daily.   [DISCONTINUED] loratadine (CLARITIN) 10 MG tablet Take 10 mg by mouth daily as needed for allergies.   No facility-administered medications prior to visit.    Review of Systems  Constitutional:  Negative for activity change, appetite change, chills, fatigue and fever.  HENT:  Negative for congestion, postnasal drip, rhinorrhea, sinus pressure, sinus pain, sneezing and sore throat.   Eyes: Negative.   Respiratory:  Negative for cough, chest tightness, shortness of breath and wheezing.   Cardiovascular:  Negative for chest pain and palpitations.  Having problems with low blood pressure and bradycardia   Gastrointestinal:  Negative for abdominal pain, constipation, diarrhea, nausea and vomiting.  Endocrine: Negative for cold intolerance, heat intolerance, polydipsia and polyuria.  Genitourinary:  Negative for dyspareunia, dysuria, flank pain, frequency and urgency.  Musculoskeletal:  Negative for arthralgias, back pain and myalgias.  Skin:  Negative  for rash.  Allergic/Immunologic: Negative for environmental allergies.  Neurological:  Positive for dizziness, syncope and headaches. Negative for weakness.  Hematological:  Negative for adenopathy.  Psychiatric/Behavioral:  The patient is nervous/anxious.     Last CBC Lab Results  Component Value Date   WBC 6.6 04/11/2022   HGB 14.2 04/11/2022   HCT 42.7 04/11/2022   MCV 88 04/11/2022   MCH 29.2 04/11/2022   RDW 11.8 04/11/2022   PLT 271 27/74/1287   Last metabolic panel Lab Results  Component Value Date   GLUCOSE 79 04/11/2022   NA 140 04/11/2022   K 4.6 04/11/2022   CL 102 04/11/2022   CO2 22 04/11/2022   BUN 21 04/11/2022   CREATININE 0.91 04/11/2022   EGFR 81 04/11/2022   CALCIUM 9.6 04/11/2022   PROT 7.3 04/11/2022   ALBUMIN 4.4 04/11/2022   LABGLOB 2.9 04/11/2022   AGRATIO 1.5 04/11/2022   BILITOT 0.4 04/11/2022   ALKPHOS 67 04/11/2022   AST 11 04/11/2022   ALT 10 04/11/2022   ANIONGAP 7 06/05/2021   Last lipids Lab Results  Component Value Date   CHOL 208 (H) 04/11/2022   HDL 60 04/11/2022   LDLCALC 133 (H) 04/11/2022   LDLDIRECT 141 (H) 09/23/2020   TRIG 86 04/11/2022   CHOLHDL 3.3 06/28/2021   Last hemoglobin A1c Lab Results  Component Value Date   HGBA1C 5.2 04/11/2022   Last thyroid functions Lab Results  Component Value Date   TSH 1.270 04/11/2022   T3TOTAL 115 06/18/2019   Last vitamin D Lab Results  Component Value Date   VD25OH 42.9 04/11/2022       Objective     Today's Vitals   05/23/22 1105  BP: 95/67  Pulse: 71  SpO2: 99%  Weight: 183 lb 3.2 oz (83.1 kg)  Height: '5\' 8"'$  (1.727 m)   Body mass index is 27.86 kg/m.  BP Readings from Last 3 Encounters:  05/23/22 95/67  05/09/22 97/67  04/17/22 101/65    Wt Readings from Last 3 Encounters:  05/23/22 183 lb 3.2 oz (83.1 kg)  05/09/22 183 lb (83 kg)  04/11/22 185 lb (83.9 kg)    Physical Exam   The patient is alert and oriented. He is pleasant and answering all  questions appropriately. Breathing is non-labored. He is in no acute distress.    Results for orders placed or performed in visit on 05/23/22  POCT UA - Microalbumin  Result Value Ref Range   Microalbumin Ur, POC 30 mg/L   Creatinine, POC 200 mg/dL   Albumin/Creatinine Ratio, Urine, POC 30     Assessment & Plan    1. Syncope and collapse Unclear cause. Currently followed per cardiology with loop monitor implanted. Will get MRI with and without contrast further evaluation.  - MR Brain W Wo Contrast; Future  2. Migraine without status migrainosus, not intractable, unspecified migraine type May take imitrex as needed and as prescribed for acute migraines. MRI with and without contrast for further evaluation.  - SUMAtriptan (IMITREX) 50 MG tablet; TAKE 1 TABLET BY MOUTH AT ONSET OF MIGRAINE, MAY REPEAT IN 2 HOURS IF HEADACHE PERSISTS  Dispense:  10 tablet; Refill: 2 - MR Brain W Wo Contrast; Future  3. Type 2 diabetes mellitus without complication, without long-term current use of insulin (HCC) Recent HgbA1c 5.2. urine microalbumin is normal.  - POCT UA - Microalbumin  4. Hyperlipidemia associated with type 2 diabetes mellitus ([place]) Continue crestor as prescribed.   5. Panic attack as reaction to stress May take clonazepam 0.5 mg up to twice daily as needed for acute panic attack. New prescription sent to her pharmacy today.  - clonazePAM (KLONOPIN) 0.5 MG tablet; Take 1 tablet by mouth 2  times daily as needed for anxiety.  Dispense: 45 tablet; Refill: 2   Problem List Items Addressed This Visit       Cardiovascular and Mediastinum   Migraine without status migrainosus, not intractable   Relevant Medications   clonazePAM (KLONOPIN) 0.5 MG tablet   SUMAtriptan (IMITREX) 50 MG tablet   Other Relevant Orders   MR Brain W Wo Contrast     Endocrine   Type 2 diabetes mellitus without complication, without long-term current use of insulin (HCC)   Relevant Orders   POCT UA -  Microalbumin (Completed)   Hyperlipidemia associated with type 2 diabetes mellitus ([place])   Relevant Orders   POCT UA - Microalbumin (Completed)     Other   Syncope and collapse - Primary   Relevant Orders   MR Brain W Wo Contrast   Panic attack as reaction to stress   Relevant Medications   clonazePAM (KLONOPIN) 0.5 MG tablet     Return in about 4 weeks (around 06/20/2022) for migraines, syncopal episodes .     I discussed the assessment and treatment plan with the patient. The patient was provided an opportunity to ask questions and all were answered. The patient agreed with the plan and demonstrated an understanding of the instructions.   The patient was advised to call back or seek an in-person evaluation if the symptoms worsen or if the condition fails to improve as anticipated.  I provided 15 minutes of non-face-to-face time during this encounter.   Ronnell Freshwater, NP    Ronnell Freshwater, NP  [facility] [phone] (phone) [phone] (fax)  [place]

## 2022-05-27 ENCOUNTER — Encounter (HOSPITAL_BASED_OUTPATIENT_CLINIC_OR_DEPARTMENT_OTHER): Payer: Self-pay | Admitting: Cardiology

## 2022-06-04 ENCOUNTER — Encounter: Payer: Self-pay | Admitting: Nurse Practitioner

## 2022-06-04 ENCOUNTER — Other Ambulatory Visit (HOSPITAL_COMMUNITY): Payer: Self-pay

## 2022-06-04 NOTE — Telephone Encounter (Signed)
Hey. On 05/23/2022, I connected with her over phone visit. I ordered a MRI for further evaluation. She is stating here that she has not heard from imaging center to schedule this. Is there a way you could check on this for me? Please and thank you.  HB

## 2022-06-05 NOTE — Progress Notes (Deleted)
Cardiology Office Note Date:  06/05/2022  Patient ID:  [name] [name], [name] [name] 01-13-1981, MRN [id number] PCP:  Ronnell Freshwater, NP  Cardiologist:  Dr. Harrell Gave Electrophysiologist: Dr. Lovena Le    Chief Complaint: planned f/u  History of Present Illness: [name] [name] is a 42 y.o. female with history of  chronic dizziness/presyncope/syncope, PCOS, prior bariatric surgery, former tobacco use, COVID-19, Nonobstructive CAD  She comes in today to be seen for dr. Lovena Le, last seen by him May 2022, continued to struggle with dizziness, lightheaded/weak spells that could last hours.  No syncope, learning to get sitting/supine quickly with the onset of symptoms. Loop noted some ST, "no long pauses", started on Florinef  Developed nausea and florinef stopped  Following with cardiology team  Most recently C. Walker, NP 10/04/21,  noted prior evaluation on 12/12/2020 by neurology.  Vertigo was easily provoked in clinic with rapid head movements.  She was recommended for repeat sleep study. She had orthostatic vital signs which were positive.  01/31/21 she was started on midodrine 2.'5mg'$  TID. Did not note improvement in symptoms and discontinued.   At the time of this visit she was s/p abdominoplasty with liposuction of mons pubis about 6 weeks, having some post op symptoms including some edema, using some compression stockings. Episodes have overall been less bothersome than previous.  Still with FMLA for up to 3 days of work per month.  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 Planned to resume her statin  Pt reached out requesting FMLA forms be filled out  11/22/21, called reporting dizziness with slow HRs observed, advised to maintain adequate hydration, wear her compression stockings and being seen by EP to review HR data/loop recorder  I saw her 11/28/21 She has had an up tick in symptoms of late and with her pulse Ox noted some HRs to 38-40's and the behavior of her symptoms with  these are different, take longer to recover from. No syncope Here in the waiting room she had a slight episodes and made a symptom episode. She had one this Am as well Sometimes an awareness of her heart beating, not always Episode are mostly the same she will start with ringing in her ears, vision starts to get tunneled, feels weak, lightheaded and then has to get to seated or supine, feels a bit shaky after wards No CP No SOB Parameters on her loop adjusted brady to <50 and pause detection >3 sec  subsequently A number of calls with c/o CP, dizziness,  racing heart, mentioned stressful days at work. Following with plastic sx for panniculitis Follows with weight and wellness clinic Has seen GYN   Saw Dr. Clarice Pole, note is incomplete, ongoing symptoms, coronary CT was reassuring, noted that she had symptoms with some bradycardia on her loop but the worst of the symptoms were not. Urged to stay hydrated  Ongoing calls with continued symptoms, rates 47-50, HR histograms very good and advised to come in.  I saw her 01/26/22 Only once did she have sense of racing heart beats, this woke her from sleep, lasted a few minutes, NOT associated with any other symptoms. No tachy episodes noted Her dizzy/lightheaded spells always occur the same: She can be seated, more often standing/up and about She starts to feel a fullness or ringing in her ars, then a sense of weakness, lightheaded, and will sit or lay down, put her feet up and will pass. She has not fallen or fainted since she was here. When  able to, she puts her pulse ox on and HRs typically 48-50's with these and seems to connect the symptoms to slow HRs She had bariatric surgery (gastric sleeve) in 2018 and as she has lost weight her symptoms started and have progressed in frequency and severity She started 330lb > about 214 in the 1st few years, leveled off and now again on weight loss journey > 180's fairly steadily now. Frustrating,  because she feels worse now then she did at her heaviest. She has had a couple fleeting palpitations, these are brief and otherwise without symptoms. She has had a dull persistent Headache for about 10 days, no t escalating or changing, but wont go away.  No focal neuro symptoms  I did not suspect HR/rhythm as the driver for her symptoms in review of her ILR  She was good with compression wear, probably could do better with salt intake. Brady parameter adjusted to <40  Saw Dr Harrell Gave 02/06/22, ongoing symptoms, HR/rhythm on ILR did not support this as etiology, discussed may need to consider stopping her  GLP1RA if symptoms persist to see if this is playing a role Suspected vagal   PMD telephone visit 05/23/22, reported : -blood pressure problems -having blood pressure and heart rate dropping in the middle of the night.  -having panic attacks.  -sees cardiology  -no known reason for this happening.  -recently had incident at work where she blacked out.  -has "aura" prior to these events  -gets terrible headache after these symptoms occur.  -gets very tired after symptoms occur. Wants to sleep for hours.  -can go months without any of these symptoms.   Planned for MRI to w/u syncope Rx Imitrex for migraine Klonopin for panic attacks  MRI is pending  *** ILR findings..... *** date of syncope at work?  Device information MDT LINQ II, implanted 06/22/2020   Past Medical History:  Diagnosis Date   Abnormal uterine bleeding    Amenorrhea    Anemia    Anxiety    panic attacks   Back pain    Bradycardia    Chest pain    COVID 02/2020   Diabetes ([place])    h/o   Dysmenorrhea    Endometriosis    Gallbladder problem    GERD (gastroesophageal reflux disease)    occasional - diet controlled and tums prn   HLD (hyperlipidemia)    HTN (hypertension)    IBS (irritable bowel syndrome)    Infertility, female    Insulin resistance    Migraine without aura    Missed abortion     x 2 - both resolved without surgery   Obesity    PCOS (polycystic ovarian syndrome)    PONV (postoperative nausea and vomiting)    pe rpatient ; scopalamine patch very helpful    SVD (spontaneous vaginal delivery)    x 1   Syncope and collapse 06/21/2020   Urinary incontinence    Vitamin D deficiency     Past Surgical History:  Procedure Laterality Date   ABDOMINOPLASTY/PANNICULECTOMY WITH LIPOSUCTION N/A 08/24/2021   Procedure: ABDOMINOPLASTY/PANNICULECTOMY WITH LIPOSUCTION, LIPOSUCTION OF MONS PUBIS;  Surgeon: Wallace Going, DO;  Location: [facility];  Service: Plastics;  Laterality: N/A;   CHOLECYSTECTOMY     COLONOSCOPY  2017   polyps   CYSTOSCOPY N/A 10/29/2016   Procedure: CYSTOSCOPY;  Surgeon: Salvadore Dom, MD;  Location: [facility];  Service: Gynecology;  Laterality: N/A;   DILATATION & CURRETTAGE/HYSTEROSCOPY WITH RESECTOCOPE  N/A 10/17/2012   Procedure: DILATATION & CURETTAGE/HYSTEROSCOPY WITH RESECTOCOPE; Possible Polypectomy, Possible Resectoscopic Myomectomy.;  Surgeon: Floyce Stakes. Pamala Hurry, MD;  Location: [facility];  Service: Gynecology;  Laterality: N/A;  1 hr.   DILATION AND CURETTAGE OF UTERUS     ENDOMETRIAL BIOPSY     ENDOVENOUS ABLATION SAPHENOUS VEIN W/ LASER Right 05/08/2018   endovenous laser ablation right greater saphenous vein and stab phlebectomy > 20 incisions right leg by Deitra Mayo MD    ESOPHAGOGASTRODUODENOSCOPY ENDOSCOPY  2017   Normal   HYSTEROSCOPY WITH D & C N/A 12/01/2013   Procedure: IUD Removal;  Surgeon: Floyce Stakes. Pamala Hurry, MD;  Location: [facility];  Service: Gynecology;  Laterality: N/A;  90 min.   INTRAUTERINE DEVICE (IUD) INSERTION N/A 10/17/2012   Procedure: INTRAUTERINE DEVICE (IUD) INSERTION; Mirena;  Surgeon: Floyce Stakes. Pamala Hurry, MD;  Location: [facility];  Service: Gynecology;  Laterality: N/A;   LAPAROSCOPIC GASTRIC SLEEVE RESECTION N/A 02/19/2017   Procedure: LAPAROSCOPIC GASTRIC SLEEVE RESECTION, UPPER ENDOSCOPY;   Surgeon: Johnathan Hausen, MD;  Location: [facility];  Service: General;  Laterality: N/A;   LAPAROSCOPIC SALPINGO OOPHERECTOMY Left 10/29/2016   Procedure: LAPAROSCOPIC SALPINGO OOPHORECTOMY With anterior uterine biopsy;  Surgeon: Salvadore Dom, MD;  Location: [facility];  Service: Gynecology;  Laterality: Left;   LASIK  10/06/2018   LOOP RECORDER INSERTION N/A 06/22/2020   Procedure: LOOP RECORDER INSERTION;  Surgeon: Evans Lance, MD;  Location: [facility];  Service: Cardiovascular;  Laterality: N/A;   MOUTH SURGERY     wisdom teeth    Current Outpatient Medications  Medication Sig Dispense Refill   APPLE CIDER VINEGAR PO Take 1 tablet by mouth daily.     aspirin EC 81 MG tablet Take 81 mg by mouth every other day. Swallow whole.     Biotin w/ Vitamins C & E (HAIR/SKIN/NAILS PO) Take 1 tablet by mouth daily.      clonazePAM (KLONOPIN) 0.5 MG tablet Take 1 tablet by mouth 2  times daily as needed for anxiety. 45 tablet 2   cyanocobalamin (VITAMIN B12) 1000 MCG/ML injection Inject 1 mL (1,000 mcg total) into the skin every 14 (fourteen) days. 30 mL 0   levonorgestrel (MIRENA) 20 MCG/24HR IUD 1 each by Intrauterine route once.     Multiple Vitamins-Minerals (BARIATRIC MULTIVITAMINS/IRON PO) Take 1 tablet by mouth in the morning and at bedtime.     ondansetron (ZOFRAN-ODT) 4 MG disintegrating tablet Dissolve 1 tablet by mouth every 8 hours as needed for nausea or vomiting. 20 tablet 0   Probiotic Product (PROBIOTIC ADVANCED PO) Take 1 capsule by mouth daily.      rosuvastatin (CRESTOR) 10 MG tablet Take 1 tablet by mouth daily. 90 tablet 1   SUMAtriptan (IMITREX) 50 MG tablet TAKE 1 TABLET BY MOUTH AT ONSET OF MIGRAINE, MAY REPEAT IN 2 HOURS IF HEADACHE PERSISTS 10 tablet 2   SYRINGE-NEEDLE, DISP, 3 ML (B-D 3CC LUER-LOK SYR 25GX5/8") 25G X 5/8" 3 ML MISC Use to inject b12 every other week. 12 each 0   tirzepatide (MOUNJARO) 7.5 MG/0.5ML Pen Inject 7.5 mg into the skin once a week. 6 mL  2   Vitamin D, Ergocalciferol, (DRISDOL) 1.25 MG (50000 UNIT) CAPS capsule Take 1 capsule (50,000 Units total) by mouth every 7 (seven) days. 8 capsule 0   No current facility-administered medications for this visit.    Allergies:   Iohexol and Pyridium [phenazopyridine hcl]   Social History:  The patient  reports that  she quit smoking about 6 years ago. Her smoking use included cigarettes. She has a 4.75 pack-year smoking history. She has never used smokeless tobacco. She reports current alcohol use. She reports that she does not use drugs.   Family History:  The patient's family history includes Alcohol abuse in her paternal grandfather; Breast cancer in her paternal aunt; Colon cancer in her maternal uncle; Dementia in her father; Depression in her mother; Diabetes in her father, maternal grandfather, maternal grandmother, mother, paternal grandfather, and paternal grandmother; Heart disease in her father, maternal grandfather, maternal grandmother, mother, and paternal grandfather; Hyperlipidemia in her father; Hypertension in her father and mother; Obesity in her father and mother; Parkinson's disease in her father; Sleep apnea in her father; Stroke in her maternal grandfather and paternal grandfather; Thyroid disease in her father.  ROS:  Please see the history of present illness.    All other systems are reviewed and otherwise negative.   PHYSICAL EXAM:  VS:  There were no vitals taken for this visit. BMI: There is no height or weight on file to calculate BMI. Well nourished, well developed, in no acute distress HEENT: normocephalic, atraumatic Neck: no JVD, carotid bruits or masses Cardiac: ***  RRR; no significant murmurs, no rubs, or gallops Lungs: *** CTA b/l, no wheezing, rhonchi or rales Abd: soft, nontender MS: no deformity or atrophy Ext: *** no edema Skin: warm and dry, no rash Neuro:  No gross deficits appreciated Psych: euthymic mood, full affect  *** ILR site is  stable, no tethering or discomfort   EKG:  not done today   Device interrogation done today and reviewed by myself:  ***  12/25/2021: Coronary CT IMPRESSION: 1. Coronary artery calcium score 67.6 Agatston units. This places the patient in the 99th percentile for age and gender, suggesting high risk for future cardiac events.   2.  Mild nonobstructive CAD.   [place] LE Venous 08/26/2020: Summary:  Right:  - No evidence of deep vein thrombosis from the common femoral through the  popliteal veins.  - No evidence of superficial venous thrombosis.  - The common femoral vein is not competent.  - The great saphenous vein is not competent at the knee and proximal calf.  - The small saphenous vein is competent.     Left:  - No evidence of deep vein thrombosis from the common femoral through the  popliteal veins.  - No evidence of superficial venous thrombosis.  - The deep venous system is not competent.  - The great saphenous vein is ablated proximally and competent distally.  - The small saphenous vein is not competent.   Echo 06/22/2020: 1. Left ventricular ejection fraction, by estimation, is 60 to 65%. The  left ventricle has normal function. The left ventricle has no regional  wall motion abnormalities. There is mild left ventricular hypertrophy.  Left ventricular diastolic parameters  were normal.   2. Right ventricular systolic function is normal. The right ventricular  size is normal. Tricuspid regurgitation signal is inadequate for assessing  PA pressure.   3. The mitral valve is normal in structure. No evidence of mitral valve  regurgitation.   4. The aortic valve was not well visualized. Aortic valve regurgitation  is not visualized. No aortic stenosis is present.   5. The inferior vena cava is normal in size with greater than 50%  respiratory variability, suggesting right atrial pressure of 3 mmHg.    Monitor 11/23/2019: 12 days of data recorded on Zio monitor. Patient  had a min HR of 37 bpm, max HR of 180 bpm, and avg HR of 67 bpm. Predominant underlying rhythm was Sinus Rhythm. No VT, SVT, atrial fibrillation, high degree block, or pauses noted. Isolated atrial and ventricular ectopy was rare (<1%). There were 91 triggered events. These were all sinus, with occasional isolate PAC or PVC. No significant arrhythmias detected.   Monitor 04/21/18 NSR Rare PAC/PVC s Symptoms do not correlate with arrhythmia Average HR 69 bpm   CT coronary 03/23/18 Coronary Arteries:  Normal coronary origin.  Right dominance.   RCA is a large dominant artery that gives rise to PDA and PLVB. There is no plaque.   Left main is a large artery that gives rise to LAD and LCX arteries. Left main has no plaque.   LAD is a large vessel that gives rise to two diagonal arteries. Proximal LAD has mild calcified plaque with associated stenosis 0-25%. Mid LAD has a mild non-calcified plaque with associated stenosis 0-25%.   LCX is a small non-dominant artery that gives rise to one small OM1 branch. There is no plaque.   Other findings:   Normal pulmonary vein drainage into the left atrium.   Normal let atrial appendage without a thrombus.   IMPRESSION: 1. Coronary calcium score of 46. This was 71 percentile for age and sex matched control.   2. Normal coronary origin with right dominance.   3. 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 Aggressive medical management is recommended.   4. Mildly dilated pulmonary artery measuring 32 mm suggestive of pulmonary hypertension.  Recent Labs: 04/11/2022: ALT 10; BUN 21; Creatinine, Ser 0.91; Hemoglobin 14.2; Platelets 271; Potassium 4.6; Sodium 140; TSH 1.270  06/28/2021: Chol/HDL Ratio 3.3 04/11/2022: Cholesterol, Total 208; HDL 60; LDL Chol Calc (NIH) 133; Triglycerides 86   CrCl cannot be calculated (Patient's most recent lab result is older than the maximum 21 days allowed.).   Wt Readings from Last 3  Encounters:  05/23/22 183 lb 3.2 oz (83.1 kg)  05/09/22 183 lb (83 kg)  04/11/22 185 lb (83.9 kg)     Other studies reviewed: Additional studies/records reviewed today include: summarized above  ASSESSMENT AND PLAN:  Dizziness, weak spells  Suspect perhaps component of autonomic dysfunction Onset s/p bariatric surgery and worse the more weight she loses. ***   Disposition: we will continue to follow closely  Current medicines are reviewed at length with the patient today.  The patient did not have any concerns regarding medicines.  Venetia Night, PA-C 06/05/2022 6:05 PM     [address] [place] [address] [phone] (office)  [phone] (fax)

## 2022-06-06 ENCOUNTER — Ambulatory Visit: Payer: 59 | Admitting: Physician Assistant

## 2022-06-13 ENCOUNTER — Other Ambulatory Visit (HOSPITAL_COMMUNITY): Payer: Self-pay

## 2022-06-13 ENCOUNTER — Encounter: Payer: Self-pay | Admitting: Nurse Practitioner

## 2022-06-13 ENCOUNTER — Ambulatory Visit (INDEPENDENT_AMBULATORY_CARE_PROVIDER_SITE_OTHER): Payer: 59 | Admitting: Nurse Practitioner

## 2022-06-13 VITALS — BP 93/61 | HR 72 | Ht 68.0 in | Wt 181.4 lb

## 2022-06-13 DIAGNOSIS — R55 Syncope and collapse: Secondary | ICD-10-CM | POA: Diagnosis not present

## 2022-06-13 DIAGNOSIS — I2583 Coronary atherosclerosis due to lipid rich plaque: Secondary | ICD-10-CM

## 2022-06-13 DIAGNOSIS — E785 Hyperlipidemia, unspecified: Secondary | ICD-10-CM

## 2022-06-13 DIAGNOSIS — I2510 Atherosclerotic heart disease of native coronary artery without angina pectoris: Secondary | ICD-10-CM

## 2022-06-13 DIAGNOSIS — G43909 Migraine, unspecified, not intractable, without status migrainosus: Secondary | ICD-10-CM | POA: Diagnosis not present

## 2022-06-13 MED ORDER — ROSUVASTATIN CALCIUM 5 MG PO TABS
5.0000 mg | ORAL_TABLET | Freq: Every day | ORAL | 1 refills | Status: DC
  Filled 2022-06-13: qty 90, 90d supply, fill #0

## 2022-06-13 NOTE — Progress Notes (Signed)
Established patient visit   Patient: [name] [name]   DOB: Nov 29, 1980   42 y.o. Female  MRN: [id number] Visit Date: 06/13/2022  Chief Complaint  Patient presents with   Follow-up   Subjective    HPI  Having syncopal episodes  -passed out last Tuesday at work - had to leave work -having these episodes nearly weekly.  -she does not have history of hypertension  -sees cardiology -unable to get ito see cardiology for next few months  -wearing implanted loop recorder so heart rate can be evaluated. Is having some pauses in her heart rhythm.  -does have MRI scheduled for 06/23/2022. -has been having these episodes pretty consistently since 06/2020.  -does get massive headaches after these events. Headaches can be so bad that she vomits.  -zofran helps nausea and she does need new prescriptions.  -causing problems at work, as she is unsafe to drive  --has given me FMLA paperwork to recommend accommodations to work from home during episodes  -Did have CTA coronary artery, Ca score in 12/2021. Results showed the following -  IMPRESSION: 1. Coronary artery calcium score 67.6 Agatston units. This places the patient in the 99th percentile for age and gender, suggesting high risk for future cardiac events.  2.  Mild nonobstructive CAD.  Medications: Outpatient Medications Prior to Visit  Medication Sig Note   APPLE CIDER VINEGAR PO Take 1 tablet by mouth daily.    aspirin EC 81 MG tablet Take 81 mg by mouth every other day. Swallow whole.    Biotin w/ Vitamins C & E (HAIR/SKIN/NAILS PO) Take 1 tablet by mouth daily.     clonazePAM (KLONOPIN) 0.5 MG tablet Take 1 tablet by mouth 2  times daily as needed for anxiety.    levonorgestrel (MIRENA) 20 MCG/24HR IUD 1 each by Intrauterine route once.    Multiple Vitamins-Minerals (BARIATRIC MULTIVITAMINS/IRON PO) Take 1 tablet by mouth in the morning and at bedtime.    Probiotic Product (PROBIOTIC ADVANCED PO) Take 1 capsule by mouth daily.      SYRINGE-NEEDLE, DISP, 3 ML (B-D 3CC LUER-LOK SYR 25GX5/8") 25G X 5/8" 3 ML MISC Use to inject b12 every other week.    tirzepatide (MOUNJARO) 7.5 MG/0.5ML Pen Inject 7.5 mg into the skin once a week.    [DISCONTINUED] cyanocobalamin (VITAMIN B12) 1000 MCG/ML injection Inject 1 mL (1,000 mcg total) into the skin every 14 (fourteen) days.    [DISCONTINUED] ondansetron (ZOFRAN-ODT) 4 MG disintegrating tablet Dissolve 1 tablet by mouth every 8 hours as needed for nausea or vomiting.    [DISCONTINUED] SUMAtriptan (IMITREX) 50 MG tablet TAKE 1 TABLET BY MOUTH AT ONSET OF MIGRAINE, MAY REPEAT IN 2 HOURS IF HEADACHE PERSISTS    [DISCONTINUED] Vitamin D, Ergocalciferol, (DRISDOL) 1.25 MG (50000 UNIT) CAPS capsule Take 1 capsule (50,000 Units total) by mouth every 7 (seven) days.    [DISCONTINUED] rosuvastatin (CRESTOR) 10 MG tablet Take 1 tablet by mouth daily. 06/13/2022: lowering dose to 5 mg daily   No facility-administered medications prior to visit.    Review of Systems See HPI      Objective     Today's Vitals   06/13/22 0943  BP: 93/61  Pulse: 72  SpO2: 98%  Weight: 181 lb 6.4 oz (82.3 kg)  Height: 5\' 8"  (1.727 m)   Body mass index is 27.58 kg/m.   Physical Exam Vitals and nursing note reviewed.  Constitutional:      Appearance: Normal appearance. She is well-developed.  HENT:  Head: Normocephalic and atraumatic.     Nose: Nose normal.     Mouth/Throat:     Mouth: Mucous membranes are moist.     Pharynx: Oropharynx is clear.  Eyes:     Extraocular Movements: Extraocular movements intact.     Conjunctiva/sclera: Conjunctivae normal.     Pupils: Pupils are equal, round, and reactive to light.  Cardiovascular:     Rate and Rhythm: Normal rate and regular rhythm.     Pulses: Normal pulses.     Heart sounds: Normal heart sounds.  Pulmonary:     Effort: Pulmonary effort is normal.     Breath sounds: Normal breath sounds.  Abdominal:     Palpations: Abdomen is soft.   Musculoskeletal:        General: Normal range of motion.     Cervical back: Normal range of motion and neck supple.  Lymphadenopathy:     Cervical: No cervical adenopathy.  Skin:    General: Skin is warm and dry.     Capillary Refill: Capillary refill takes less than 2 seconds.  Neurological:     General: No focal deficit present.     Mental Status: She is alert and oriented to person, place, and time.  Psychiatric:        Mood and Affect: Mood normal.        Behavior: Behavior normal.        Thought Content: Thought content normal.        Judgment: Judgment normal.     Results for orders placed or performed in visit on 06/13/22  CK total and CKMB (cardiac)not at [facility]  Result Value Ref Range   Total CK 32 32 - 182 U/L   CK-MB Index <1.0 0.0 - 5.3 ng/mL  Phosphorus  Result Value Ref Range   Phosphorus 3.3 3.0 - 4.3 mg/dL  Magnesium  Result Value Ref Range   Magnesium 2.1 1.6 - 2.3 mg/dL  CBC  Result Value Ref Range   WBC 7.2 3.4 - 10.8 x10E3/uL   RBC 5.11 3.77 - 5.28 x10E6/uL   Hemoglobin 14.9 11.1 - 15.9 g/dL   Hematocrit 44.1 34.0 - 46.6 %   MCV 86 79 - 97 fL   MCH 29.2 26.6 - 33.0 pg   MCHC 33.8 31.5 - 35.7 g/dL   RDW 12.7 11.7 - 15.4 %   Platelets 308 150 - 450 B15V7/OH  Basic Metabolic Panel (BMET)  Result Value Ref Range   Glucose 83 70 - 99 mg/dL   BUN 21 6 - 24 mg/dL   Creatinine, Ser 0.75 0.57 - 1.00 mg/dL   eGFR 103 >59 mL/min/1.73   BUN/Creatinine Ratio 28 (H) 9 - 23   Sodium 139 134 - 144 mmol/L   Potassium 4.3 3.5 - 5.2 mmol/L   Chloride 102 96 - 106 mmol/L   CO2 21 20 - 29 mmol/L   Calcium 9.6 8.7 - 10.2 mg/dL    Assessment & Plan     1. Syncope and collapse Thus far, results of all testing has been within normal limits. Check additional labs today for further evaluation. FMLA paperwork to be completed and returned to the patient when finished.  - Basic Metabolic Panel (BMET); Future - CBC; Future - Magnesium; Future - Phosphorus; Future -  CK total and CKMB (cardiac)not at [facility]; Future - CK total and CKMB (cardiac)not at [facility] - Phosphorus - Magnesium - CBC - Basic Metabolic Panel (BMET) - EKG 12-Lead  2. Coronary atherosclerosis due  to lipid rich plaque Continue regular visits with cardiology as scheduled.   3. Hyperlipidemia LDL goal <70 Add crestor 5 mg daily.  - rosuvastatin (CRESTOR) 5 MG tablet; Take 1 tablet (5 mg total) by mouth daily.  Dispense: 90 tablet; Refill: 1  4. Migraine without status migrainosus, not intractable, unspecified migraine type Continue regular visits with neurology as scheduled.    Return for as scheduled.        Ronnell Freshwater, NP  [facility] [phone] (phone) [phone] (fax)  [place]

## 2022-06-14 ENCOUNTER — Encounter: Payer: Self-pay | Admitting: Nurse Practitioner

## 2022-06-14 DIAGNOSIS — N906 Unspecified hypertrophy of vulva: Secondary | ICD-10-CM | POA: Diagnosis not present

## 2022-06-14 LAB — BASIC METABOLIC PANEL
BUN/Creatinine Ratio: 28 — ABNORMAL HIGH (ref 9–23)
BUN: 21 mg/dL (ref 6–24)
CO2: 21 mmol/L (ref 20–29)
Calcium: 9.6 mg/dL (ref 8.7–10.2)
Chloride: 102 mmol/L (ref 96–106)
Creatinine, Ser: 0.75 mg/dL (ref 0.57–1.00)
Glucose: 83 mg/dL (ref 70–99)
Potassium: 4.3 mmol/L (ref 3.5–5.2)
Sodium: 139 mmol/L (ref 134–144)
eGFR: 103 mL/min/{1.73_m2} (ref >59)

## 2022-06-14 LAB — CBC
Hematocrit: 44.1 % (ref 34.0–46.6)
Hemoglobin: 14.9 g/dL (ref 11.1–15.9)
MCH: 29.2 pg (ref 26.6–33.0)
MCHC: 33.8 g/dL (ref 31.5–35.7)
MCV: 86 fL (ref 79–97)
Platelets: 308 10*3/uL (ref 150–450)
RBC: 5.11 x10E6/uL (ref 3.77–5.28)
RDW: 12.7 % (ref 11.7–15.4)
WBC: 7.2 10*3/uL (ref 3.4–10.8)

## 2022-06-14 LAB — CK TOTAL AND CKMB (NOT AT ARMC)
CK-MB Index: <1 ng/mL (ref 0.0–5.3)
Total CK: 32 U/L (ref 32–182)

## 2022-06-14 LAB — MAGNESIUM: Magnesium: 2.1 mg/dL (ref 1.6–2.3)

## 2022-06-14 LAB — PHOSPHORUS: Phosphorus: 3.3 mg/dL (ref 3.0–4.3)

## 2022-06-16 ENCOUNTER — Other Ambulatory Visit: Payer: 59

## 2022-06-18 ENCOUNTER — Ambulatory Visit: Payer: 59

## 2022-06-18 DIAGNOSIS — R55 Syncope and collapse: Secondary | ICD-10-CM | POA: Diagnosis not present

## 2022-06-18 NOTE — Telephone Encounter (Signed)
Pt is calling wanting the update on the FMLA paperwork. Pt states the deadline was this past Friday but she wants to turn it in ASAP.

## 2022-06-19 LAB — CUP PACEART REMOTE DEVICE CHECK
Date Time Interrogation Session: 20240209230857
Implantable Pulse Generator Implant Date: 20220216

## 2022-06-19 NOTE — Progress Notes (Addendum)
Cardiology Office Note Date:  06/22/2022  Patient ID:  [name] [name] 11-09-1980, MRN [id number] PCP:  Ronnell Freshwater, NP  Cardiologist:  Dr. Harrell Gave Electrophysiologist: Dr. Lovena Le    Chief Complaint:  planned f/u  History of Present Illness: [name] [name] is a 42 y.o. female with history of  chronic dizziness/presyncope/syncope, PCOS, prior bariatric surgery, former tobacco use, COVID-19, Nonobstructive CAD  She comes in today to be seen for dr. Lovena Le, last seen by him May 2022, continued to struggle with dizziness, lightheaded/weak spells that could last hours.  No syncope, learning to get sitting/supine quickly with the onset of symptoms. Loop noted some ST, "no long pauses", started on Florinef  Developed nausea and florinef stopped  Following with cardiology team  saw C. Walker, NP 10/04/21,  noted prior evaluation on 12/12/2020 by neurology.  Vertigo was easily provoked in clinic with rapid head movements.  She was recommended for repeat sleep study. She had orthostatic vital signs which were positive.  01/31/21 she was started on midodrine 2.29m TID. Did not note improvement in symptoms and discontinued.   At the time of this visit she was s/p abdominoplasty with liposuction of mons pubis about 6 weeks, having some post op symptoms including some edema, using some compression stockings. Episodes have overall been less bothersome than previous.  Still with FMLA for up to 3 days of work per month.  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 Planned to resume her statin  Pt reached out requesting FMLA forms be filled out  11/22/21, called reporting dizziness with slow HRs observed, advised to maintain adequate hydration, wear her compression stockings and being seen by EP to review HR data/loop recorder  I saw her 11/28/21 She has had an up tick in symptoms of late and with her pulse Ox noted some HRs to 38-40's and the behavior of her symptoms with these are  different, take longer to recover from. No syncope Here in the waiting room she had a slight episodes and made a symptom episode. She had one this Am as well Sometimes an awareness of her heart beating, not always Episode are mostly the same she will start with ringing in her ears, vision starts to get tunneled, feels weak, lightheaded and then has to get to seated or supine, feels a bit shaky after wards No CP No SOB Parameters on her loop adjusted brady to <50 and pause detection >3 sec  subsequently A number of calls with c/o CP, dizziness,  racing heart, mentioned stressful days at work. Following with plastic sx for panniculitis Follows with weight and wellness clinic Has seen GYN   Saw Dr. CClarice Pole note is incomplete, ongoing symptoms, coronary CT was reassuring, noted that she had symptoms with some bradycardia on her loop but the worst of the symptoms were not. Urged to stay hydrated  Ongoing calls with continued symptoms, rates 47-50, HR histograms very good and advised to come in.  I saw her 01/26/22 Only once did she have sense of racing heart beats, this woke her from sleep, lasted a few minutes, NOT associated with any other symptoms. No tachy episodes noted Her dizzy/lightheaded spells always occur the same: She can be seated, more often standing/up and about She starts to feel a fullness or ringing in her ars, then a sense of weakness, lightheaded, and will sit or lay down, put her feet up and will pass. She has not fallen or fainted since she was here. When  able to, she puts her pulse ox on and HRs typically 48-50's with these and seems to connect the symptoms to slow HRs She had bariatric surgery (gastric sleeve) in 2018 and as she has lost weight her symptoms started and have progressed in frequency and severity She started 330lb > about 214 in the 1st few years, leveled off and now again on weight loss journey > 180's fairly steadily now. Frustrating, because she  feels worse now then she did at her heaviest. She has had a couple fleeting palpitations, these are brief and otherwise without symptoms. She has had a dull persistent Headache for about 10 days, no t escalating or changing, but wont go away.  No focal neuro symptoms  I did not suspect HR/rhythm as the driver for her symptoms in review of her ILR  She was good with compression wear, probably could do better with salt intake. Brady parameter adjusted to <40  Saw Dr Harrell Gave 02/06/22, ongoing symptoms, HR/rhythm on ILR did not support this as etiology, discussed may need to consider stopping her  GLP1RA if symptoms persist to see if this is playing a role Suspected vagal   PMD telephone visit 05/23/22, reported : -blood pressure problems -having blood pressure and heart rate dropping in the middle of the night.  -having panic attacks.  -sees cardiology  -no known reason for this happening.  -recently had incident at work where she blacked out.  -has "aura" prior to these events  -gets terrible headache after these symptoms occur.  -gets very tired after symptoms occur. Wants to sleep for hours.  -can go months without any of these symptoms.   Planned for MRI to w/u syncope Rx Imitrex for migraine Klonopin for panic attacks  MRI is pending  TODAY She is accompanied by her boyfriend today She has had 2 more significant episodes of late. Both at work, both morning time.  1/8: generally had not felt particularly well that AM, generalized sense of weak/lightheaded, was seated, coworker was taking to her, she started to get ringing in her ears, and s feeling of something washing over her, coworker commented if she was feeling OK, mentioned she thought she was going to faint and next next thing she knew waking with the in office APP attending to her She recalls her BP 90's/? And HR perhaps 45 or so as she was waking. Subsequently feeling well again  2/5: again at work, seated, started to  feel weak, lightheaded, nausea, was told she looked pale, and nearly fainted, HR somewhere about 49-52, BP 82/47.  She wears body/torso compression w/spanks, if not that full LE leggings w/compression. She has done better with sodium in her diet, feels like she drinks good water, has added Gatorade    Review of her monitor no HRs reaching the 40 rate, on histograms. She has a number of pause episodes all of them are false for loss of signa. Or noise. Symptoms episodes are sinus with HRs 60's or better Good HR variability   Device information MDT LINQ II, implanted 06/22/2020   Past Medical History:  Diagnosis Date   Abnormal uterine bleeding    Amenorrhea    Anemia    Anxiety    panic attacks   Back pain    Bradycardia    Chest pain    COVID 02/2020   Diabetes ([place])    h/o   Dysmenorrhea    Endometriosis    Gallbladder problem    GERD (gastroesophageal reflux disease)  occasional - diet controlled and tums prn   HLD (hyperlipidemia)    HTN (hypertension)    IBS (irritable bowel syndrome)    Infertility, female    Insulin resistance    Migraine without aura    Missed abortion    x 2 - both resolved without surgery   Obesity    PCOS (polycystic ovarian syndrome)    PONV (postoperative nausea and vomiting)    pe rpatient ; scopalamine patch very helpful    SVD (spontaneous vaginal delivery)    x 1   Syncope and collapse 06/21/2020   Urinary incontinence    Vitamin D deficiency     Past Surgical History:  Procedure Laterality Date   ABDOMINOPLASTY/PANNICULECTOMY WITH LIPOSUCTION N/A 08/24/2021   Procedure: ABDOMINOPLASTY/PANNICULECTOMY WITH LIPOSUCTION, LIPOSUCTION OF MONS PUBIS;  Surgeon: Wallace Going, DO;  Location: [facility];  Service: Plastics;  Laterality: N/A;   CHOLECYSTECTOMY     COLONOSCOPY  2017   polyps   CYSTOSCOPY N/A 10/29/2016   Procedure: CYSTOSCOPY;  Surgeon: Salvadore Dom, MD;  Location: [facility];  Service:  Gynecology;  Laterality: N/A;   DILATATION & CURRETTAGE/HYSTEROSCOPY WITH RESECTOCOPE N/A 10/17/2012   Procedure: DILATATION & CURETTAGE/HYSTEROSCOPY WITH RESECTOCOPE; Possible Polypectomy, Possible Resectoscopic Myomectomy.;  Surgeon: Floyce Stakes. Pamala Hurry, MD;  Location: [facility];  Service: Gynecology;  Laterality: N/A;  1 hr.   DILATION AND CURETTAGE OF UTERUS     ENDOMETRIAL BIOPSY     ENDOVENOUS ABLATION SAPHENOUS VEIN W/ LASER Right 05/08/2018   endovenous laser ablation right greater saphenous vein and stab phlebectomy > 20 incisions right leg by Deitra Mayo MD    ESOPHAGOGASTRODUODENOSCOPY ENDOSCOPY  2017   Normal   HYSTEROSCOPY WITH D & C N/A 12/01/2013   Procedure: IUD Removal;  Surgeon: Floyce Stakes. Pamala Hurry, MD;  Location: [facility];  Service: Gynecology;  Laterality: N/A;  90 min.   INTRAUTERINE DEVICE (IUD) INSERTION N/A 10/17/2012   Procedure: INTRAUTERINE DEVICE (IUD) INSERTION; Mirena;  Surgeon: Floyce Stakes. Pamala Hurry, MD;  Location: [facility];  Service: Gynecology;  Laterality: N/A;   LAPAROSCOPIC GASTRIC SLEEVE RESECTION N/A 02/19/2017   Procedure: LAPAROSCOPIC GASTRIC SLEEVE RESECTION, UPPER ENDOSCOPY;  Surgeon: Johnathan Hausen, MD;  Location: [facility];  Service: General;  Laterality: N/A;   LAPAROSCOPIC SALPINGO OOPHERECTOMY Left 10/29/2016   Procedure: LAPAROSCOPIC SALPINGO OOPHORECTOMY With anterior uterine biopsy;  Surgeon: Salvadore Dom, MD;  Location: [facility];  Service: Gynecology;  Laterality: Left;   LASIK  10/06/2018   LOOP RECORDER INSERTION N/A 06/22/2020   Procedure: LOOP RECORDER INSERTION;  Surgeon: Evans Lance, MD;  Location: [facility];  Service: Cardiovascular;  Laterality: N/A;   MOUTH SURGERY     wisdom teeth    Current Outpatient Medications  Medication Sig Dispense Refill   APPLE CIDER VINEGAR PO Take 1 tablet by mouth daily.     aspirin EC 81 MG tablet Take 81 mg by mouth every other day. Swallow whole.     Biotin w/ Vitamins C & E (HAIR/SKIN/NAILS  PO) Take 1 tablet by mouth daily.      clonazePAM (KLONOPIN) 0.5 MG tablet Take 1 tablet by mouth 2  times daily as needed for anxiety. 45 tablet 2   cyanocobalamin (VITAMIN B12) 1000 MCG/ML injection Inject 1 mL (1,000 mcg total) into the skin every 14 (fourteen) days. 30 mL 0   levonorgestrel (MIRENA) 20 MCG/24HR IUD 1 each by Intrauterine route once.     Multiple Vitamins-Minerals (BARIATRIC MULTIVITAMINS/IRON  PO) Take 1 tablet by mouth in the morning and at bedtime.     ondansetron (ZOFRAN-ODT) 4 MG disintegrating tablet Dissolve 1 tablet by mouth every 8 hours as needed for nausea or vomiting. 20 tablet 0   Probiotic Product (PROBIOTIC ADVANCED PO) Take 1 capsule by mouth daily.      rosuvastatin (CRESTOR) 5 MG tablet Take 1 tablet (5 mg total) by mouth daily. 90 tablet 1   SUMAtriptan (IMITREX) 50 MG tablet TAKE 1 TABLET BY MOUTH AT ONSET OF MIGRAINE, MAY REPEAT IN 2 HOURS IF HEADACHE PERSISTS 10 tablet 2   SYRINGE-NEEDLE, DISP, 3 ML (B-D 3CC LUER-LOK SYR 25GX5/8") 25G X 5/8" 3 ML MISC Use to inject b12 every other week. 12 each 0   tirzepatide (MOUNJARO) 7.5 MG/0.5ML Pen Inject 7.5 mg into the skin once a week. 6 mL 2   Vitamin D, Ergocalciferol, (DRISDOL) 1.25 MG (50000 UNIT) CAPS capsule Take 1 capsule (50,000 Units total) by mouth every 7 (seven) days. 8 capsule 0   No current facility-administered medications for this visit.    Allergies:   Iohexol and Pyridium [phenazopyridine hcl]   Social History:  The patient  reports that she quit smoking about 6 years ago. Her smoking use included cigarettes. She has a 4.75 pack-year smoking history. She has never used smokeless tobacco. She reports current alcohol use. She reports that she does not use drugs.   Family History:  The patient's family history includes Alcohol abuse in her paternal grandfather; Breast cancer in her paternal aunt; Colon cancer in her maternal uncle; Dementia in her father; Depression in her mother; Diabetes in her  father, maternal grandfather, maternal grandmother, mother, paternal grandfather, and paternal grandmother; Heart disease in her father, maternal grandfather, maternal grandmother, mother, and paternal grandfather; Hyperlipidemia in her father; Hypertension in her father and mother; Obesity in her father and mother; Parkinson's disease in her father; Sleep apnea in her father; Stroke in her maternal grandfather and paternal grandfather; Thyroid disease in her father.  ROS:  Please see the history of present illness.    All other systems are reviewed and otherwise negative.   PHYSICAL EXAM:  VS:  BP 108/72   Pulse 77   Ht 5' 8"$  (1.727 m)   Wt 184 lb 12.8 oz (83.8 kg)   SpO2 99%   BMI 28.10 kg/m  BMI: Body mass index is 28.1 kg/m. Well nourished, well developed, in no acute distress HEENT: normocephalic, atraumatic Neck: no JVD, carotid bruits or masses Cardiac:  RRR; no significant murmurs, no rubs, or gallops Lungs: CTA b/l, no wheezing, rhonchi or rales Abd: soft, nontender MS: no deformity or atrophy Ext: no edema Skin: warm and dry, no rash Neuro:  No gross deficits appreciated Psych: euthymic mood, full affect  ILR site is stable, no tethering or discomfort   EKG:  not done today Personally reviewed 06/14/22, SB 50bpm, icRBBB 156m (not new)  Device interrogation done today and reviewed by myself:  Batter is good R waves 0.265mFindings as discussed above   12/25/2021: Coronary CT IMPRESSION: 1. Coronary artery calcium score 67.6 Agatston units. This places the patient in the 99th percentile for age and gender, suggesting high risk for future cardiac events.   2.  Mild nonobstructive CAD.   [place] Venous 08/26/2020: Summary:  Right:  - No evidence of deep vein thrombosis from the common femoral through the  popliteal veins.  - No evidence of superficial venous thrombosis.  - The common femoral  vein is not competent.  - The great saphenous vein is not competent at  the knee and proximal calf.  - The small saphenous vein is competent.     Left:  - No evidence of deep vein thrombosis from the common femoral through the  popliteal veins.  - No evidence of superficial venous thrombosis.  - The deep venous system is not competent.  - The great saphenous vein is ablated proximally and competent distally.  - The small saphenous vein is not competent.   Echo 06/22/2020: 1. Left ventricular ejection fraction, by estimation, is 60 to 65%. The  left ventricle has normal function. The left ventricle has no regional  wall motion abnormalities. There is mild left ventricular hypertrophy.  Left ventricular diastolic parameters  were normal.   2. Right ventricular systolic function is normal. The right ventricular  size is normal. Tricuspid regurgitation signal is inadequate for assessing  PA pressure.   3. The mitral valve is normal in structure. No evidence of mitral valve  regurgitation.   4. The aortic valve was not well visualized. Aortic valve regurgitation  is not visualized. No aortic stenosis is present.   5. The inferior vena cava is normal in size with greater than 50%  respiratory variability, suggesting right atrial pressure of 3 mmHg.    Monitor 11/23/2019: 12 days of data recorded on Zio monitor. Patient had a min HR of 37 bpm, max HR of 180 bpm, and avg HR of 67 bpm. Predominant underlying rhythm was Sinus Rhythm. No VT, SVT, atrial fibrillation, high degree block, or pauses noted. Isolated atrial and ventricular ectopy was rare (<1%). There were 91 triggered events. These were all sinus, with occasional isolate PAC or PVC. No significant arrhythmias detected.   Monitor 04/21/18 NSR Rare PAC/PVC s Symptoms do not correlate with arrhythmia Average HR 69 bpm   CT coronary 03/23/18 Coronary Arteries:  Normal coronary origin.  Right dominance.   RCA is a large dominant artery that gives rise to PDA and PLVB. There is no plaque.   Left main  is a large artery that gives rise to LAD and LCX arteries. Left main has no plaque.   LAD is a large vessel that gives rise to two diagonal arteries. Proximal LAD has mild calcified plaque with associated stenosis 0-25%. Mid LAD has a mild non-calcified plaque with associated stenosis 0-25%.   LCX is a small non-dominant artery that gives rise to one small OM1 branch. There is no plaque.   Other findings:   Normal pulmonary vein drainage into the left atrium.   Normal let atrial appendage without a thrombus.   IMPRESSION: 1. Coronary calcium score of 46. This was 83 percentile for age and sex matched control.   2. Normal coronary origin with right dominance.   3. 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 Aggressive medical management is recommended.   4. Mildly dilated pulmonary artery measuring 32 mm suggestive of pulmonary hypertension.  Recent Labs: 04/11/2022: ALT 10; TSH 1.270 06/13/2022: BUN 21; Creatinine, Ser 0.75; Hemoglobin 14.9; Magnesium 2.1; Platelets 308; Potassium 4.3; Sodium 139  06/28/2021: Chol/HDL Ratio 3.3 04/11/2022: Cholesterol, Total 208; HDL 60; LDL Chol Calc (NIH) 133; Triglycerides 86   Estimated Creatinine Clearance: 105 mL/min (by C-G formula based on SCr of 0.75 mg/dL).   Wt Readings from Last 3 Encounters:  06/22/22 184 lb 12.8 oz (83.8 kg)  06/20/22 178 lb (80.7 kg)  06/13/22 181 lb 6.4 oz (82.3 kg)  Other studies reviewed: Additional studies/records reviewed today include: summarized above  ASSESSMENT AND PLAN:  Dizziness, weak spells  Suspect perhaps component of autonomic dysfunction Onset s/p bariatric surgery and worse the more weight she loses.  I suspect a vagal component, with reported hypotension being the most likely cause of her syncope.near syncope, weak spells. Seems to have somewhat of a typical beginning with warning. Unclear what the trigger may be, ? Migraine, seizure? She is pending an MRI with her PMD,  perhaps a neuro eval as well. She felt poorly with florinef and midodrine did not seem to help much, she is not terrible excited about retrying those I think.  She will continue with hydration sodium supplementations and compression strategy We discussed strategy of safety and symptoms recognition.  I provided her with a symptom activator, often unable to get her phone APP to open up. Also asked her to keep a symptom diary to try and identify patterns, triggers   Disposition: plan EP again in 4 mo, sooner if needed  Current medicines are reviewed at length with the patient today.  The patient did not have any concerns regarding medicines.  Venetia Night, PA-C 06/22/2022 8:45 AM     [facility] [address] [place] [address] [phone] (office)  [phone] (fax)

## 2022-06-20 ENCOUNTER — Ambulatory Visit: Payer: 59 | Admitting: Nurse Practitioner

## 2022-06-20 ENCOUNTER — Other Ambulatory Visit (HOSPITAL_COMMUNITY): Payer: Self-pay

## 2022-06-20 ENCOUNTER — Ambulatory Visit (INDEPENDENT_AMBULATORY_CARE_PROVIDER_SITE_OTHER): Payer: 59 | Admitting: Physician Assistant

## 2022-06-20 VITALS — BP 99/69 | HR 75 | Temp 98.2°F | Ht 68.0 in | Wt 178.0 lb

## 2022-06-20 DIAGNOSIS — E559 Vitamin D deficiency, unspecified: Secondary | ICD-10-CM

## 2022-06-20 DIAGNOSIS — E538 Deficiency of other specified B group vitamins: Secondary | ICD-10-CM

## 2022-06-20 DIAGNOSIS — E119 Type 2 diabetes mellitus without complications: Secondary | ICD-10-CM | POA: Diagnosis not present

## 2022-06-20 DIAGNOSIS — Z7985 Long-term (current) use of injectable non-insulin antidiabetic drugs: Secondary | ICD-10-CM

## 2022-06-20 DIAGNOSIS — E669 Obesity, unspecified: Secondary | ICD-10-CM

## 2022-06-20 DIAGNOSIS — Z6827 Body mass index (BMI) 27.0-27.9, adult: Secondary | ICD-10-CM | POA: Diagnosis not present

## 2022-06-20 MED ORDER — VITAMIN D (ERGOCALCIFEROL) 1.25 MG (50000 UNIT) PO CAPS
50000.0000 [IU] | ORAL_CAPSULE | ORAL | 0 refills | Status: DC
  Filled 2022-06-20 – 2022-08-28 (×2): qty 8, 56d supply, fill #0

## 2022-06-20 MED ORDER — CYANOCOBALAMIN 1000 MCG/ML IJ SOLN
1000.0000 ug | INTRAMUSCULAR | 0 refills | Status: DC
  Filled 2022-06-20: qty 30, fill #0
  Filled 2022-08-28: qty 6, 84d supply, fill #0

## 2022-06-20 NOTE — Progress Notes (Signed)
Chief Complaint:   OBESITY [name] [name] is here to discuss her progress with her obesity treatment plan along with follow-up of her obesity related diagnoses. [name] [name] is on the Category 1 Plan and states she is following her eating plan approximately 85% of the time. [name] [name] states she is doing Cardio/walking  30 minutes 5 times per week.  Today's visit was #: 2 Starting weight: 243 lbs Starting date: 06/18/2019 Today's weight: 178 lbs Today's date: 06/20/2022 Total lbs lost to date: 62 lbs Total lbs lost since last in-office visit: 5 lbs  Interim History: [name] [name] has done very well with weight loss.  She has recovered from Covid.  She continues to have episodes of low BP/low HR and feeling like she will pass out at times.  She has undergone evaluation by her PCP and is to have a Brain MRI done this week for further evaluation of migraine headaches/syncope.  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 She is on a loop monitor. Work remains stressful and she does feel anxious and wonders if stress is contributing to her episodes. Exercise- less more recently as she is afraid she will have an episode of low BP/syncope while trying to work out.  She is consistently hydrating well with sugar free beverages, electrolyte water.    Subjective:   1. Type 2 diabetes mellitus without complication, without long-term current use of insulin (HCC) She is on Mounjaro 7.5 mg weekly. No side effects with Mounjaro. Good control of hunger and appetite. She is not skipping meals.  Last A1c 5.2 04/11/22- at goal. She has refills already.   2. Vitamin D deficiency She is on Ergocalciferol 50,000 IU weekly. No side effects. Last Vit D level 42.9 04/11/22- nearing goal. No side effects with Vit D.   3. B12 deficiency On B12 1000 mcg injection once weekly. No side effects. Last B12 587 04/11/22- improved with supplementation.  4. Generalized obesity-  Starting BMI 36.95   5. BMI 27.0-27.9,adult, Current BMI 27.1    Assessment/Plan:   1. Type 2 diabetes mellitus without complication, without long-term current use of insulin (HCC) Continue Mounjaro 7.5 mg weekly. Continue to follow nutrition plan to decrease simple carbohydrates, increase lean protein and exercise as able to promote weight loss and maintain glycemic control.   2. Vitamin D deficiency Vitamin D Deficiency Vitamin D is not at goal of 50.  Most recent vitamin D level was 42.9. She is on  prescription ergocalciferol 50,000 IU weekly. Lab Results  Component Value Date   VD25OH 42.9 04/11/2022   VD25OH 58.7 12/06/2021   VD25OH 83.7 06/28/2021    Plan: Refill prescription vitamin D 50,000 IU weekly. Recheck level at least 2-3 times yearly to avoid over supplementation.  - Vitamin D, Ergocalciferol, (DRISDOL) 1.25 MG (50000 UNIT) CAPS capsule; Take 1 capsule (50,000 Units total) by mouth every 7 (seven) days.  Dispense: 8 capsule; Refill: 0  3. B12 deficiency Continue weekly Vitamin b12 injections. Will recheck level in 3 months.  - cyanocobalamin (VITAMIN B12) 1000 MCG/ML injection; Inject 1 mL (1,000 mcg total) into the skin every 14 (fourteen) days.  Dispense: 30 mL; Refill: 0  4. Generalized obesity- Starting BMI 36.95   5. BMI 27.0-27.9,adult, Current BMI 27.1   [name] [name] is currently in the action stage of change. As such, her goal is to continue with weight loss efforts. She has agreed to the Category 1 Plan.  Exercise goals: All adults should avoid inactivity. Some physical activity is better than none, and adults who participate in any amount of physical activity gain some health benefits.  Behavioral modification strategies: increasing lean protein intake, decreasing simple carbohydrates, increasing water intake, no skipping meals, and planning for success.  [name] [name] has agreed to follow-up with our clinic in 4 weeks. She was informed of the importance of  frequent follow-up visits to maximize her success with intensive lifestyle modifications for her multiple health conditions.     Objective:   Blood pressure 99/69, pulse 75, temperature 98.2 F (36.8 C), height 5' 8"$  (1.727 m), weight 178 lb (80.7 kg), SpO2 99 %. Body mass index is 27.06 kg/m.  General: Cooperative, alert, well developed, in no acute distress. HEENT: Conjunctivae and lids unremarkable. Cardiovascular: Regular rhythm.  Lungs: Normal work of breathing. Neurologic: No focal deficits.   Lab Results  Component Value Date   CREATININE 0.75 06/13/2022   BUN 21 06/13/2022   NA 139 06/13/2022   K 4.3 06/13/2022   CL 102 06/13/2022   CO2 21 06/13/2022   Lab Results  Component Value Date   ALT 10 04/11/2022   AST 11 04/11/2022   ALKPHOS 67 04/11/2022   BILITOT 0.4 04/11/2022   Lab Results  Component Value Date   HGBA1C 5.2 04/11/2022   HGBA1C 5.7 (H) 12/06/2021   HGBA1C 5.2 06/28/2021   HGBA1C 5.7 (H) 09/23/2020   HGBA1C 5.8 (H) 08/18/2020   Lab Results  Component Value Date   INSULIN 6.9 04/11/2022   INSULIN 11.0 08/18/2020   INSULIN 11.9 10/14/2019   INSULIN 10.1 06/18/2019   Lab Results  Component Value Date   TSH 1.270 04/11/2022   Lab Results  Component Value Date   CHOL 208 (H) 04/11/2022   HDL 60 04/11/2022   LDLCALC 133 (H) 04/11/2022   LDLDIRECT 141 (H) 09/23/2020   TRIG 86 04/11/2022   CHOLHDL 3.3 06/28/2021   Lab Results  Component Value Date   VD25OH 42.9 04/11/2022   VD25OH 58.7 12/06/2021   VD25OH 83.7 06/28/2021   Lab Results  Component Value Date   WBC 7.2 06/13/2022   HGB 14.9 06/13/2022   HCT 44.1 06/13/2022   MCV 86 06/13/2022   PLT 308 06/13/2022   Lab Results  Component Value Date   IRON 69 07/05/2021   TIBC 267 07/05/2021   FERRITIN 65 07/05/2021    Obesity Behavioral Intervention:   Approximately 15 minutes were spent on the discussion below.  ASK: We discussed the diagnosis of obesity with [name] [name]  today and [name] [name] agreed to give [place] permission to discuss obesity behavioral modification therapy today.  ASSESS: [name] [name] has the diagnosis of obesity and her BMI today is 27.1. [name] [name] is in the action stage of change.   ADVISE: [name] [name] was educated on the multiple health risks of obesity as well as the benefit of weight loss to improve her health. She was advised of the need for long term treatment and the importance of lifestyle modifications to improve her current health and to decrease her risk of future health problems.  AGREE: Multiple dietary modification options and treatment options were discussed and [name] [name] agreed to follow the recommendations documented in the above note.  ARRANGE: [name] [name] was educated on the importance of frequent visits to treat obesity as outlined per CMS and USPSTF guidelines and agreed to schedule her next follow up appointment today.  Attestation Statements:   Reviewed by clinician on day of visit: allergies, medications,  problem list, medical history, surgical history, family history, social history, and previous encounter notes.  [name] [name] Balik,PA-C

## 2022-06-21 ENCOUNTER — Other Ambulatory Visit (HOSPITAL_COMMUNITY): Payer: Self-pay

## 2022-06-21 ENCOUNTER — Encounter (INDEPENDENT_AMBULATORY_CARE_PROVIDER_SITE_OTHER): Payer: Self-pay | Admitting: Physician Assistant

## 2022-06-22 ENCOUNTER — Encounter: Payer: Self-pay | Admitting: Physician Assistant

## 2022-06-22 ENCOUNTER — Ambulatory Visit: Payer: 59 | Attending: Physician Assistant | Admitting: Physician Assistant

## 2022-06-22 VITALS — BP 108/72 | HR 77 | Ht 68.0 in | Wt 184.8 lb

## 2022-06-22 DIAGNOSIS — R531 Weakness: Secondary | ICD-10-CM

## 2022-06-22 DIAGNOSIS — R55 Syncope and collapse: Secondary | ICD-10-CM | POA: Diagnosis not present

## 2022-06-22 DIAGNOSIS — R42 Dizziness and giddiness: Secondary | ICD-10-CM | POA: Diagnosis not present

## 2022-06-22 DIAGNOSIS — Z4509 Encounter for adjustment and management of other cardiac device: Secondary | ICD-10-CM | POA: Diagnosis not present

## 2022-06-22 NOTE — Patient Instructions (Signed)
Medication Instructions:   Your physician recommends that you continue on your current medications as directed. Please refer to the Current Medication list given to you today.   *If you need a refill on your cardiac medications before your next appointment, please call your pharmacy*   Lab Work:  [place]     If you have labs (blood work) drawn today and your tests are completely normal, you will receive your results only by: [place] (if you have MyChart) OR A paper copy in the mail If you have any lab test that is abnormal or we need to change your treatment, we will call you to review the results.   Testing/Procedures: NONE ORDERED  TODAY     Follow-Up: At [facility], you and your health needs are our priority.  As part of our continuing mission to provide you with exceptional heart care, we have created designated Provider Care Teams.  These Care Teams include your primary Cardiologist (physician) and Advanced Practice Providers (APPs -  Physician Assistants and Nurse Practitioners) who all work together to provide you with the care you need, when you need it.  We recommend signing up for the patient portal called "MyChart".  Sign up information is provided on this After Visit Summary.  MyChart is used to connect with patients for Virtual Visits (Telemedicine).  Patients are able to view lab/test results, encounter notes, upcoming appointments, etc.  Non-urgent messages can be sent to your provider as well.   To learn more about what you can do with MyChart, go to [email or web address].    Your next appointment:   4 month(s)  Provider:   Tommye Standard, PA-C      Other Instructions

## 2022-06-23 ENCOUNTER — Other Ambulatory Visit (HOSPITAL_COMMUNITY): Payer: Self-pay

## 2022-06-23 ENCOUNTER — Ambulatory Visit
Admission: RE | Admit: 2022-06-23 | Discharge: 2022-06-23 | Disposition: A | Discharge status: Home / Self Care | Payer: 59 | Source: Ambulatory Visit | Attending: Nurse Practitioner | Admitting: Nurse Practitioner

## 2022-06-23 DIAGNOSIS — R55 Syncope and collapse: Secondary | ICD-10-CM

## 2022-06-23 DIAGNOSIS — G43909 Migraine, unspecified, not intractable, without status migrainosus: Secondary | ICD-10-CM

## 2022-06-23 DIAGNOSIS — R42 Dizziness and giddiness: Secondary | ICD-10-CM | POA: Diagnosis not present

## 2022-06-23 MED ORDER — GADOPICLENOL 0.5 MMOL/ML IV SOLN
8.0000 mL | Freq: Once | INTRAVENOUS | Status: AC | PRN
  Administered 2022-06-23: 8 mL via INTRAVENOUS

## 2022-06-25 NOTE — Progress Notes (Signed)
[facility] Summary Report / Loop Recorder 

## 2022-06-26 ENCOUNTER — Encounter: Payer: Self-pay | Admitting: Nurse Practitioner

## 2022-06-27 ENCOUNTER — Ambulatory Visit: Payer: 59 | Admitting: Physician Assistant

## 2022-07-04 NOTE — Progress Notes (Signed)
Established patient visit   Patient: [name] [name]   DOB: Apr 06, 1981   42 y.o. Female  MRN: [id number] Visit Date: 07/05/2022   Chief Complaint  Patient presents with   Medical Management of Chronic Issues   Subjective    HPI  Follow up  -MRI of the brain normal  -seeing cardiology  --continues to use loop recorder to record events when she has them.  -having multiple syncopal episodes that have headache, tinnitus, fatigue, and nausea associated with them.  -worse first thing in the morning  -worse when dealing with elevated levels of stress.  -may need neurology  -has pre-existing relationship with Dr. Roddie Mc and would like to have referral back to the same neurologist.     Medications: Outpatient Medications Prior to Visit  Medication Sig   APPLE CIDER VINEGAR PO Take 1 tablet by mouth daily.   aspirin EC 81 MG tablet Take 81 mg by mouth every other day. Swallow whole.   Biotin w/ Vitamins C & E (HAIR/SKIN/NAILS PO) Take 1 tablet by mouth daily.    clonazePAM (KLONOPIN) 0.5 MG tablet Take 1 tablet by mouth 2  times daily as needed for anxiety.   cyanocobalamin (VITAMIN B12) 1000 MCG/ML injection Inject 1 mL (1,000 mcg total) into the skin every 14 (fourteen) days.   levonorgestrel (MIRENA) 20 MCG/24HR IUD 1 each by Intrauterine route once.   Multiple Vitamins-Minerals (BARIATRIC MULTIVITAMINS/IRON PO) Take 1 tablet by mouth in the morning and at bedtime.   Probiotic Product (PROBIOTIC ADVANCED PO) Take 1 capsule by mouth daily.    rosuvastatin (CRESTOR) 5 MG tablet Take 1 tablet (5 mg total) by mouth daily.   SYRINGE-NEEDLE, DISP, 3 ML (B-D 3CC LUER-LOK SYR 25GX5/8") 25G X 5/8" 3 ML MISC Use to inject b12 every other week.   tirzepatide (MOUNJARO) 7.5 MG/0.5ML Pen Inject 7.5 mg into the skin once a week.   Vitamin D, Ergocalciferol, (DRISDOL) 1.25 MG (50000 UNIT) CAPS capsule Take 1 capsule (50,000 Units total) by mouth every 7 (seven) days.   [DISCONTINUED] ondansetron  (ZOFRAN-ODT) 4 MG disintegrating tablet Dissolve 1 tablet by mouth every 8 hours as needed for nausea or vomiting.   [DISCONTINUED] SUMAtriptan (IMITREX) 50 MG tablet TAKE 1 TABLET BY MOUTH AT ONSET OF MIGRAINE, MAY REPEAT IN 2 HOURS IF HEADACHE PERSISTS   No facility-administered medications prior to visit.    Review of Systems See HPI    Last CBC Lab Results  Component Value Date   WBC 5.5 07/23/2022   HGB 11.9 (L) 07/23/2022   HCT 35.0 (L) 07/23/2022   MCV 84.3 07/23/2022   MCH 29.9 07/23/2022   RDW 12.1 07/23/2022   PLT 182 [id number]   Last metabolic panel Lab Results  Component Value Date   GLUCOSE 82 07/23/2022   NA 139 07/23/2022   K 4.0 07/23/2022   CL 105 07/23/2022   CO2 27 07/23/2022   BUN 14 07/23/2022   CREATININE 1.01 (H) 07/23/2022   GFRNONAA >60 07/23/2022   CALCIUM 9.6 07/23/2022   PHOS 3.3 06/13/2022   PROT 7.3 04/11/2022   ALBUMIN 4.4 04/11/2022   LABGLOB 2.9 04/11/2022   AGRATIO 1.5 04/11/2022   BILITOT 0.4 04/11/2022   ALKPHOS 67 04/11/2022   AST 11 04/11/2022   ALT 10 04/11/2022   ANIONGAP 5 07/23/2022   Last lipids Lab Results  Component Value Date   CHOL 208 (H) 04/11/2022   HDL 60 04/11/2022   LDLCALC 133 (H) 04/11/2022   LDLDIRECT 141 (  H) 09/23/2020   TRIG 86 04/11/2022   CHOLHDL 3.3 06/28/2021   Last hemoglobin A1c Lab Results  Component Value Date   HGBA1C 5.2 04/11/2022   Last thyroid functions Lab Results  Component Value Date   TSH 1.270 04/11/2022   T3TOTAL 115 06/18/2019   Last vitamin D Lab Results  Component Value Date   VD25OH 42.9 04/11/2022   Last vitamin B12 and Folate Lab Results  Component Value Date   VITAMINB12 587 04/11/2022   FOLATE 15.8 01/17/2021       Objective     Today's Vitals   07/05/22 1344  BP: 100/69  Pulse: 100  SpO2: 100%  Weight: 179 lb 6.4 oz (81.4 kg)  Height: 5\' 8"  (1.727 m)   Body mass index is 27.28 kg/m.  BP Readings from Last 3 Encounters:  07/23/22 98/62   07/05/22 100/69  06/22/22 108/72    Wt Readings from Last 3 Encounters:  07/23/22 174 lb (78.9 kg)  07/05/22 179 lb 6.4 oz (81.4 kg)  06/22/22 184 lb 12.8 oz (83.8 kg)    Physical Exam Vitals and nursing note reviewed.  Constitutional:      Appearance: Normal appearance. She is well-developed.  HENT:     Head: Normocephalic and atraumatic.     Nose: Nose normal.     Mouth/Throat:     Mouth: Mucous membranes are moist.     Pharynx: Oropharynx is clear.  Eyes:     Extraocular Movements: Extraocular movements intact.     Conjunctiva/sclera: Conjunctivae normal.     Pupils: Pupils are equal, round, and reactive to light.  Cardiovascular:     Rate and Rhythm: Normal rate and regular rhythm.     Pulses: Normal pulses.     Heart sounds: Normal heart sounds.  Pulmonary:     Effort: Pulmonary effort is normal.     Breath sounds: Normal breath sounds.  Abdominal:     Palpations: Abdomen is soft.  Musculoskeletal:        General: Normal range of motion.     Cervical back: Normal range of motion and neck supple.  Lymphadenopathy:     Cervical: No cervical adenopathy.  Skin:    General: Skin is warm and dry.     Capillary Refill: Capillary refill takes less than 2 seconds.  Neurological:     General: No focal deficit present.     Mental Status: She is alert and oriented to person, place, and time.  Psychiatric:        Mood and Affect: Mood normal.        Behavior: Behavior normal.        Thought Content: Thought content normal.        Judgment: Judgment normal.      Assessment & Plan    Syncope and collapse -     Ambulatory referral to Neurology  Migraine without status migrainosus, not intractable, unspecified migraine type Assessment & Plan: Trial imitrex to take as needed and as indicated for migraine headache. Refer to neurology for further evaluation and treatment.   Orders: -     Ambulatory referral to Neurology -     SUMAtriptan Succinate; Take 1 tablet by  mouth  at onset of headache. May repeat in 2 hours if symptoms are persistent  Dispense: 10 tablet; Refill: 2  Nausea Assessment & Plan: Associated with migraine headaches. May take zofran as needed and as prescribed.   Orders: -     Ondansetron; Dissolve 1  tablet by mouth every 8 hours as needed for nausea or vomiting.  Dispense: 30 tablet; Refill: 2  Near syncope Assessment & Plan: Currently being followed by cardiology. Referral made to neurology today      Problem List Items Addressed This Visit       Cardiovascular and Mediastinum   Near syncope    Currently being followed by cardiology. Referral made to neurology today       Migraine without status migrainosus, not intractable    Trial imitrex to take as needed and as indicated for migraine headache. Refer to neurology for further evaluation and treatment.       Relevant Medications   SUMAtriptan (IMITREX) 50 MG tablet   Other Relevant Orders   Ambulatory referral to Neurology     Other   Nausea    Associated with migraine headaches. May take zofran as needed and as prescribed.       Relevant Medications   ondansetron (ZOFRAN-ODT) 4 MG disintegrating tablet   Syncope and collapse - Primary   Relevant Orders   Ambulatory referral to Neurology     Return in about 3 months (around 10/03/2022) for migraines, syncopal episodes .         Ronnell Freshwater, NP  [facility] [phone] (phone) [phone] (fax)  [place]

## 2022-07-05 ENCOUNTER — Ambulatory Visit (INDEPENDENT_AMBULATORY_CARE_PROVIDER_SITE_OTHER): Payer: 59 | Admitting: Nurse Practitioner

## 2022-07-05 ENCOUNTER — Other Ambulatory Visit (HOSPITAL_COMMUNITY): Payer: Self-pay

## 2022-07-05 ENCOUNTER — Encounter: Payer: Self-pay | Admitting: Nurse Practitioner

## 2022-07-05 VITALS — BP 100/69 | HR 100 | Ht 68.0 in | Wt 179.4 lb

## 2022-07-05 DIAGNOSIS — R55 Syncope and collapse: Secondary | ICD-10-CM

## 2022-07-05 DIAGNOSIS — R110 Nausea: Secondary | ICD-10-CM | POA: Diagnosis not present

## 2022-07-05 DIAGNOSIS — G43909 Migraine, unspecified, not intractable, without status migrainosus: Secondary | ICD-10-CM | POA: Diagnosis not present

## 2022-07-05 MED ORDER — ONDANSETRON 4 MG PO TBDP
4.0000 mg | ORAL_TABLET | Freq: Three times a day (TID) | ORAL | 2 refills | Status: DC | PRN
  Filled 2022-07-05: qty 30, 10d supply, fill #0

## 2022-07-05 MED ORDER — SUMATRIPTAN SUCCINATE 50 MG PO TABS
ORAL_TABLET | ORAL | 2 refills | Status: AC
  Filled 2022-07-05: qty 10, 30d supply, fill #0

## 2022-07-13 ENCOUNTER — Other Ambulatory Visit (HOSPITAL_COMMUNITY): Payer: Self-pay

## 2022-07-18 ENCOUNTER — Ambulatory Visit (INDEPENDENT_AMBULATORY_CARE_PROVIDER_SITE_OTHER): Payer: 59 | Admitting: Physician Assistant

## 2022-07-18 ENCOUNTER — Encounter (INDEPENDENT_AMBULATORY_CARE_PROVIDER_SITE_OTHER): Payer: Self-pay

## 2022-07-23 ENCOUNTER — Encounter (HOSPITAL_BASED_OUTPATIENT_CLINIC_OR_DEPARTMENT_OTHER): Payer: Self-pay | Admitting: Emergency Medicine

## 2022-07-23 ENCOUNTER — Ambulatory Visit (INDEPENDENT_AMBULATORY_CARE_PROVIDER_SITE_OTHER): Payer: 59

## 2022-07-23 ENCOUNTER — Emergency Department (HOSPITAL_BASED_OUTPATIENT_CLINIC_OR_DEPARTMENT_OTHER): Payer: 59

## 2022-07-23 ENCOUNTER — Emergency Department (HOSPITAL_BASED_OUTPATIENT_CLINIC_OR_DEPARTMENT_OTHER)
Admission: EM | Admit: 2022-07-23 | Discharge: 2022-07-23 | Disposition: A | Discharge status: Home / Self Care | Payer: 59 | Attending: Emergency Medicine | Admitting: Emergency Medicine

## 2022-07-23 ENCOUNTER — Other Ambulatory Visit (HOSPITAL_BASED_OUTPATIENT_CLINIC_OR_DEPARTMENT_OTHER): Payer: Self-pay

## 2022-07-23 ENCOUNTER — Other Ambulatory Visit: Payer: Self-pay

## 2022-07-23 DIAGNOSIS — Z7982 Long term (current) use of aspirin: Secondary | ICD-10-CM | POA: Insufficient documentation

## 2022-07-23 DIAGNOSIS — U071 COVID-19: Secondary | ICD-10-CM | POA: Insufficient documentation

## 2022-07-23 DIAGNOSIS — R509 Fever, unspecified: Secondary | ICD-10-CM | POA: Diagnosis not present

## 2022-07-23 DIAGNOSIS — E860 Dehydration: Secondary | ICD-10-CM | POA: Insufficient documentation

## 2022-07-23 DIAGNOSIS — R519 Headache, unspecified: Secondary | ICD-10-CM | POA: Diagnosis present

## 2022-07-23 DIAGNOSIS — R55 Syncope and collapse: Secondary | ICD-10-CM

## 2022-07-23 DIAGNOSIS — R059 Cough, unspecified: Secondary | ICD-10-CM | POA: Diagnosis not present

## 2022-07-23 LAB — URINALYSIS, ROUTINE W REFLEX MICROSCOPIC
Bilirubin Urine: NEGATIVE
Glucose, UA: NEGATIVE mg/dL
Hgb urine dipstick: NEGATIVE
Ketones, ur: NEGATIVE mg/dL
Leukocytes,Ua: NEGATIVE
Nitrite: POSITIVE — AB
Protein, ur: NEGATIVE mg/dL
Specific Gravity, Urine: 1.012 (ref 1.005–1.030)
pH: 8 (ref 5.0–8.0)

## 2022-07-23 LAB — BASIC METABOLIC PANEL
Anion gap: 5 (ref 5–15)
BUN: 14 mg/dL (ref 6–20)
CO2: 27 mmol/L (ref 22–32)
Calcium: 9.6 mg/dL (ref 8.9–10.3)
Chloride: 105 mmol/L (ref 98–111)
Creatinine, Ser: 1.01 mg/dL — ABNORMAL HIGH (ref 0.44–1.00)
GFR, Estimated: >60 mL/min (ref >60)
Glucose, Bld: 82 mg/dL (ref 70–99)
Potassium: 4.1 mmol/L (ref 3.5–5.1)
Sodium: 137 mmol/L (ref 135–145)

## 2022-07-23 LAB — CBC WITH DIFFERENTIAL/PLATELET
Abs Immature Granulocytes: 0.01 10*3/uL (ref 0.00–0.07)
Basophils Absolute: 0 10*3/uL (ref 0.0–0.1)
Basophils Relative: 0 %
Eosinophils Absolute: 0.1 10*3/uL (ref 0.0–0.5)
Eosinophils Relative: 2 %
HCT: 38 % (ref 36.0–46.0)
Hemoglobin: 13.5 g/dL (ref 12.0–15.0)
Immature Granulocytes: 0 %
Lymphocytes Relative: 13 %
Lymphs Abs: 0.7 10*3/uL (ref 0.7–4.0)
MCH: 29.9 pg (ref 26.0–34.0)
MCHC: 35.5 g/dL (ref 30.0–36.0)
MCV: 84.3 fL (ref 80.0–100.0)
Monocytes Absolute: 0.5 10*3/uL (ref 0.1–1.0)
Monocytes Relative: 10 %
Neutro Abs: 4.2 10*3/uL (ref 1.7–7.7)
Neutrophils Relative %: 75 %
Platelets: 182 10*3/uL (ref 150–400)
RBC: 4.51 MIL/uL (ref 3.87–5.11)
RDW: 12.1 % (ref 11.5–15.5)
WBC: 5.5 10*3/uL (ref 4.0–10.5)
nRBC: 0 % (ref 0.0–0.2)

## 2022-07-23 LAB — I-STAT VENOUS BLOOD GAS, ED
Acid-Base Excess: 2 mmol/L (ref 0.0–2.0)
Bicarbonate: 25.7 mmol/L (ref 20.0–28.0)
Calcium, Ion: 1.22 mmol/L (ref 1.15–1.40)
HCT: 35 % — ABNORMAL LOW (ref 36.0–46.0)
Hemoglobin: 11.9 g/dL — ABNORMAL LOW (ref 12.0–15.0)
O2 Saturation: 53 %
Patient temperature: 99
Potassium: 4 mmol/L (ref 3.5–5.1)
Sodium: 139 mmol/L (ref 135–145)
TCO2: 27 mmol/L (ref 22–32)
pCO2, Ven: 38.6 mmHg — ABNORMAL LOW (ref 44–60)
pH, Ven: 7.433 — ABNORMAL HIGH (ref 7.25–7.43)
pO2, Ven: 28 mmHg — CL (ref 32–45)

## 2022-07-23 LAB — RESP PANEL BY RT-PCR (RSV, FLU A&B, COVID)  RVPGX2
Influenza A by PCR: NEGATIVE
Influenza B by PCR: NEGATIVE
Resp Syncytial Virus by PCR: NEGATIVE
SARS Coronavirus 2 by RT PCR: POSITIVE — AB

## 2022-07-23 LAB — GROUP A STREP BY PCR: Group A Strep by PCR: NOT DETECTED

## 2022-07-23 MED ORDER — ONDANSETRON HCL 4 MG/2ML IJ SOLN
4.0000 mg | Freq: Once | INTRAMUSCULAR | Status: AC
  Administered 2022-07-23: 4 mg via INTRAVENOUS
  Filled 2022-07-23: qty 2

## 2022-07-23 MED ORDER — PAXLOVID (300/100) 20 X 150 MG & 10 X 100MG PO TBPK
3.0000 | ORAL_TABLET | Freq: Two times a day (BID) | ORAL | 0 refills | Status: AC
  Filled 2022-07-23: qty 30, 5d supply, fill #0

## 2022-07-23 MED ORDER — ONDANSETRON 4 MG PO TBDP
4.0000 mg | ORAL_TABLET | ORAL | 0 refills | Status: DC | PRN
  Filled 2022-07-23: qty 20, 4d supply, fill #0

## 2022-07-23 MED ORDER — LACTATED RINGERS IV BOLUS
1000.0000 mL | Freq: Once | INTRAVENOUS | Status: AC
  Administered 2022-07-23: 1000 mL via INTRAVENOUS

## 2022-07-23 MED ORDER — KETOROLAC TROMETHAMINE 30 MG/ML IJ SOLN
30.0000 mg | Freq: Once | INTRAMUSCULAR | Status: AC
  Administered 2022-07-23: 30 mg via INTRAVENOUS
  Filled 2022-07-23: qty 1

## 2022-07-23 NOTE — ED Notes (Signed)
Pt was aware of the need for a urine... Pt unable to urinate currently.Marland KitchenMarland Kitchen

## 2022-07-23 NOTE — ED Provider Notes (Signed)
[facility] Provider Note   CSN: [id number] Arrival date & time: 07/23/22  0740     History {Add pertinent medical, surgical, social history, OB history to HPI:1} Chief Complaint  Patient presents with   Generalized Body Aches   Nausea   Emesis    [name] is a 42 y.o. female.  HPI     Home Medications Prior to Admission medications   Medication Sig Start Date End Date Taking? Authorizing Provider  APPLE CIDER VINEGAR PO Take 1 tablet by mouth daily.    [provider]  aspirin EC 81 MG tablet Take 81 mg by mouth every other day. Swallow whole.    [provider]  Biotin w/ Vitamins C & E (HAIR/SKIN/NAILS PO) Take 1 tablet by mouth daily.     [provider]  clonazePAM (KLONOPIN) 0.5 MG tablet Take 1 tablet by mouth 2  times daily as needed for anxiety. 05/23/22   Ronnell Freshwater, NP  cyanocobalamin (VITAMIN B12) 1000 MCG/ML injection Inject 1 mL (1,000 mcg total) into the skin every 14 (fourteen) days. 06/20/22   Rayburn, Neta Mends, PA-C  levonorgestrel (MIRENA) 20 MCG/24HR IUD 1 each by Intrauterine route once.    [provider]  Multiple Vitamins-Minerals (BARIATRIC MULTIVITAMINS/IRON PO) Take 1 tablet by mouth in the morning and at bedtime.    [provider]  ondansetron (ZOFRAN-ODT) 4 MG disintegrating tablet Dissolve 1 tablet by mouth every 8 hours as needed for nausea or vomiting. 07/05/22   Ronnell Freshwater, NP  Probiotic Product (PROBIOTIC ADVANCED PO) Take 1 capsule by mouth daily.     [provider]  rosuvastatin (CRESTOR) 5 MG tablet Take 1 tablet (5 mg total) by mouth daily. 06/13/22   Ronnell Freshwater, NP  SUMAtriptan (IMITREX) 50 MG tablet Take 1 tablet by mouth  at onset of headache. May repeat in 2 hours if symptoms are persistent 07/05/22   Boscia, Nira Conn E, NP  SYRINGE-NEEDLE, DISP, 3 ML (B-D 3CC LUER-LOK SYR 25GX5/8") 25G X 5/8" 3 ML MISC Use to  inject b12 every other week. 05/09/22   Rayburn, Neta Mends, PA-C  tirzepatide [facility]) 7.5 MG/0.5ML Pen Inject 7.5 mg into the skin once a week. 05/09/22   Rayburn, Neta Mends, PA-C  Vitamin D, Ergocalciferol, (DRISDOL) 1.25 MG (50000 UNIT) CAPS capsule Take 1 capsule (50,000 Units total) by mouth every 7 (seven) days. 06/20/22   Rayburn, Neta Mends, PA-C      Allergies    Iohexol and Pyridium [phenazopyridine hcl]    Review of Systems   Review of Systems  Physical Exam Updated Vital Signs BP 102/76 (BP Location: Right Arm)   Pulse 80   Temp 98.8 F (37.1 C) (Oral)   Resp 15   Ht 5\' 8"  (1.727 m)   Wt 78.9 kg   SpO2 100%   BMI 26.46 kg/m  Physical Exam  ED Results / Procedures / Treatments   Labs (all labs ordered are listed, but only abnormal results are displayed) Labs Reviewed  RESP PANEL BY RT-PCR (RSV, FLU A&B, COVID)  [id number]  GROUP A STREP BY PCR    EKG None  Radiology No results found.  Procedures Procedures  {Document cardiac monitor, telemetry assessment procedure when appropriate:1}  Medications Ordered in ED Medications - No data to display  ED Course/ Medical Decision Making/ A&P   {   Click here for ABCD2, HEART and other calculatorsREFRESH Note before signing :1}  Medical Decision Making  ***  {Document critical care time when appropriate:1} {Document review of labs and clinical decision tools ie heart score, Chads2Vasc2 etc:1}  {Document your independent review of radiology images, and any outside records:1} {Document your discussion with family members, caretakers, and with consultants:1} {Document social determinants of health affecting pt's care:1} {Document your decision making why or why not admission, treatments were needed:1} Final Clinical Impression(s) / ED Diagnoses Final diagnoses:  None    Rx / DC Orders ED Discharge Orders     None

## 2022-07-23 NOTE — ED Triage Notes (Signed)
Pt arrives to ED with c/o nausea, headache, vomiting, body aches, sore throat x3 days.

## 2022-07-23 NOTE — Discharge Instructions (Signed)
1.  You have COVID.  Start Paxlovid today per package instructions.  Do not take your Crestor (rosuvastatin) while taking Paxlovid. 2.  You have been given a prescription for Zofran to take for nausea.  You may take this every 4 hours.  Extra strength Tylenol every 6 hours and ibuprofen 400 mg every 6-8 hours for body aches and headaches. 3.  Try to stay hydrated.  Review instructions for dehydration for tips on rehydration fluids. 4.  Try to see your family doctor for recheck this week. 5.  Return to the emergency department if you are not able to take fluids, continue to feel worse, continue to have vomiting and diarrhea and are not able to stay hydrated.

## 2022-07-23 NOTE — ED Notes (Addendum)
Pt ambulatory to RR without assistance from staff. Given gatorade on return to room

## 2022-07-25 LAB — CUP PACEART REMOTE DEVICE CHECK
Date Time Interrogation Session: 20240317231407
Implantable Pulse Generator Implant Date: 20220216

## 2022-07-29 NOTE — Assessment & Plan Note (Signed)
Trial imitrex to take as needed and as indicated for migraine headache. Refer to neurology for further evaluation and treatment.

## 2022-07-29 NOTE — Assessment & Plan Note (Signed)
Associated with migraine headaches. May take zofran as needed and as prescribed.

## 2022-07-29 NOTE — Assessment & Plan Note (Signed)
Currently being followed by cardiology. Referral made to neurology today

## 2022-07-31 ENCOUNTER — Encounter: Payer: Self-pay | Admitting: Nurse Practitioner

## 2022-07-31 ENCOUNTER — Telehealth (INDEPENDENT_AMBULATORY_CARE_PROVIDER_SITE_OTHER): Payer: 59 | Admitting: Nurse Practitioner

## 2022-07-31 ENCOUNTER — Other Ambulatory Visit (HOSPITAL_COMMUNITY): Payer: Self-pay

## 2022-07-31 VITALS — BP 96/58 | Temp 100.9°F | Ht 68.0 in | Wt 174.0 lb

## 2022-07-31 DIAGNOSIS — U071 COVID-19: Secondary | ICD-10-CM | POA: Diagnosis not present

## 2022-07-31 DIAGNOSIS — B379 Candidiasis, unspecified: Secondary | ICD-10-CM | POA: Insufficient documentation

## 2022-07-31 DIAGNOSIS — N390 Urinary tract infection, site not specified: Secondary | ICD-10-CM

## 2022-07-31 DIAGNOSIS — T3695XA Adverse effect of unspecified systemic antibiotic, initial encounter: Secondary | ICD-10-CM | POA: Diagnosis not present

## 2022-07-31 DIAGNOSIS — R110 Nausea: Secondary | ICD-10-CM | POA: Diagnosis not present

## 2022-07-31 DIAGNOSIS — J069 Acute upper respiratory infection, unspecified: Secondary | ICD-10-CM | POA: Diagnosis not present

## 2022-07-31 MED ORDER — METHYLPREDNISOLONE 4 MG PO TBPK
ORAL_TABLET | ORAL | 0 refills | Status: DC
  Filled 2022-07-31: qty 21, 6d supply, fill #0

## 2022-07-31 MED ORDER — ONDANSETRON 4 MG PO TBDP
4.0000 mg | ORAL_TABLET | Freq: Three times a day (TID) | ORAL | 2 refills | Status: DC | PRN
  Filled 2022-07-31 – 2022-08-28 (×2): qty 30, 10d supply, fill #0

## 2022-07-31 MED ORDER — AMOXICILLIN-POT CLAVULANATE 875-125 MG PO TABS
1.0000 | ORAL_TABLET | Freq: Two times a day (BID) | ORAL | 0 refills | Status: DC
  Filled 2022-07-31: qty 20, 10d supply, fill #0

## 2022-07-31 MED ORDER — ONDANSETRON 4 MG PO TBDP
4.0000 mg | ORAL_TABLET | ORAL | 1 refills | Status: DC | PRN
  Filled 2022-07-31: qty 30, 5d supply, fill #0

## 2022-07-31 MED ORDER — FLUCONAZOLE 150 MG PO TABS
ORAL_TABLET | ORAL | 0 refills | Status: DC
  Filled 2022-07-31: qty 3, 9d supply, fill #0

## 2022-07-31 NOTE — Assessment & Plan Note (Signed)
Associated with migraine headaches, but now, associated with acute illness. May take zofran as needed and as prescribed. New prescription was sent to her pharmacy today.

## 2022-07-31 NOTE — Assessment & Plan Note (Signed)
U/A positive  for nitrites during ED visit. Treat with augmentin 875 mg twice daily for 10 days. Increase water intake. Continue to rest.

## 2022-07-31 NOTE — Assessment & Plan Note (Signed)
Start augmentin 875 mg twice daily for 10 days. Add medrol taper. Take as directed for 6 days. Rest and increase fluids. Continue using OTC medication to control symptoms.  Advised her to closely monitor her blood sugar while taking medrol as this may increase blood sugar. If it does, she should discontinue use and contact the office. She voiced understanding and agreement.

## 2022-07-31 NOTE — Progress Notes (Signed)
Virtual Visit via Telephone Note  I connected with [name] [name] on 07/31/22 at  3:30 PM EDT by telephone and verified that I am speaking with the correct person using two identifiers.  Location: Patient: home Provider: [place] primary care at [facility]     I discussed the limitations, risks, security and privacy concerns of performing an evaluation and management service by telephone and the availability of in person appointments. I also discussed with the patient that there may be a patient responsible charge related to this service. The patient expressed understanding and agreed to proceed.    Patient: [name] [name]   DOB: 02/25/1981   42 y.o. Female  MRN: [id number] Visit Date: 07/31/2022  Chief Complaint  Patient presents with   Hospitalization Follow-up   Subjective    HPI  The patient was seen in emergency department on 07/23/2022 -started feeling poorly the day before,.  -developed fever, chills, cough, congestion, fatigue, nausea with vomiting, and diarrhea.  -diagnosed with COVID 19 --prescribed paxlovid and zofran. Was given 2 liters of fluid in ED.  -u/a positive for nitrites. No antibiotic was given  -chest x-ray was negative for pneumonia.  -continues to have fevers which are partially relieved with tylenol  -has nausea, chills, headache, cough, shortness of breath, and severe fatigue.    Medications: Outpatient Medications Prior to Visit  Medication Sig   APPLE CIDER VINEGAR PO Take 1 tablet by mouth daily.   aspirin EC 81 MG tablet Take 81 mg by mouth every other day. Swallow whole.   Biotin w/ Vitamins C & E (HAIR/SKIN/NAILS PO) Take 1 tablet by mouth daily.    clonazePAM (KLONOPIN) 0.5 MG tablet Take 1 tablet by mouth 2  times daily as needed for anxiety.   cyanocobalamin (VITAMIN B12) 1000 MCG/ML injection Inject 1 mL (1,000 mcg total) into the skin every 14 (fourteen) days.   levonorgestrel (MIRENA) 20 MCG/24HR IUD 1 each by Intrauterine route once.    Multiple Vitamins-Minerals (BARIATRIC MULTIVITAMINS/IRON PO) Take 1 tablet by mouth in the morning and at bedtime.   Probiotic Product (PROBIOTIC ADVANCED PO) Take 1 capsule by mouth daily.    rosuvastatin (CRESTOR) 5 MG tablet Take 1 tablet (5 mg total) by mouth daily.   SUMAtriptan (IMITREX) 50 MG tablet Take 1 tablet by mouth  at onset of headache. May repeat in 2 hours if symptoms are persistent   SYRINGE-NEEDLE, DISP, 3 ML (B-D 3CC LUER-LOK SYR 25GX5/8") 25G X 5/8" 3 ML MISC Use to inject b12 every other week.   tirzepatide (MOUNJARO) 7.5 MG/0.5ML Pen Inject 7.5 mg into the skin once a week.   Vitamin D, Ergocalciferol, (DRISDOL) 1.25 MG (50000 UNIT) CAPS capsule Take 1 capsule (50,000 Units total) by mouth every 7 (seven) days.   [DISCONTINUED] ondansetron (ZOFRAN-ODT) 4 MG disintegrating tablet Dissolve 1 tablet by mouth every 8 hours as needed for nausea or vomiting.   [DISCONTINUED] ondansetron (ZOFRAN-ODT) 4 MG disintegrating tablet Take 1 tablet (4 mg total) by mouth every 4 (four) hours as needed for nausea or vomiting.   No facility-administered medications prior to visit.    Review of Systems See HPI      Objective     Today's Vitals   07/31/22 1310  BP: (Abnormal) 96/58  Temp: (Abnormal) 100.9 F (38.3 C)  Weight: 174 lb (78.9 kg)  Height: 5\' 8"  (1.727 m)   Body mass index is 26.46 kg/m.   Physical Exam   The patient is alert and  oriented. She is pleasant and answers all questions appropriately. Breathing is non-labored. She is in no acute distress at this time.   She is nasally congested and has loose sounding cough    Assessment & Plan     Upper respiratory tract infection due to COVID-19 virus Assessment & Plan: Start augmentin 875 mg twice daily for 10 days. Add medrol taper. Take as directed for 6 days. Rest and increase fluids. Continue using OTC medication to control symptoms.  Advised her to closely monitor her blood sugar while taking medrol as this  may increase blood sugar. If it does, she should discontinue use and contact the office. She voiced understanding and agreement.   Orders: -     methylPREDNISolone; Take by mouth as directed for 6 days  Dispense: 21 tablet; Refill: 0  Urinary tract infection without hematuria, site unspecified Assessment & Plan: U/A positive  for nitrites during ED visit. Treat with augmentin 875 mg twice daily for 10 days. Increase water intake. Continue to rest.   Orders: -     Amoxicillin-Pot Clavulanate; Take 1 tablet by mouth 2 (two) times daily.  Dispense: 20 tablet; Refill: 0  Antibiotic-induced yeast infection -     Fluconazole; Take 1 tablet by mouth once. May repeat dose in 3 days as needed for persistent symptoms.  Dispense: 3 tablet; Refill: 0  Nausea Assessment & Plan: Associated with migraine headaches, but now, associated with acute illness. May take zofran as needed and as prescribed. New prescription was sent to her pharmacy today.   Orders: -     Ondansetron; Take 1 tablet (4 mg total) by mouth every 4 (four) hours as needed for nausea or vomiting.  Dispense: 30 tablet; Refill: 1 -     Ondansetron; Dissolve 1 tablet by mouth every 8 hours as needed for nausea or vomiting.  Dispense: 30 tablet; Refill: 2     Return for prn worsening or persistent symptoms.     I discussed the assessment and treatment plan with the patient. The patient was provided an opportunity to ask questions and all were answered. The patient agreed with the plan and demonstrated an understanding of the instructions.   The patient was advised to call back or seek an in-person evaluation if the symptoms worsen or if the condition fails to improve as anticipated.  I provided 15 minutes of non-face-to-face time during this encounter.   Ronnell Freshwater, NPEstablished patient visit   Ronnell Freshwater, NP  [facility] at [facility] [phone] (phone) [phone] (fax)  [place]

## 2022-08-02 NOTE — Telephone Encounter (Signed)
Pt called to see if the paperwork had been received. I advised her to my knowledge it had not been. Pt emailed the paperwork. It was printed and placed on providers desk.   Pt states that she has 5 days to get the paperwork faxed in.

## 2022-08-02 NOTE — Progress Notes (Signed)
[facility] Summary Report / Loop Recorder 

## 2022-08-03 ENCOUNTER — Other Ambulatory Visit (HOSPITAL_COMMUNITY): Payer: Self-pay

## 2022-08-06 NOTE — Telephone Encounter (Signed)
Pt calling to inquire about this paper work, she said they reached out to her today so she was wanting to check the status of this. Abrish Erny Zimmerman Rumple, CMA

## 2022-08-07 ENCOUNTER — Telehealth: Payer: Self-pay

## 2022-08-07 ENCOUNTER — Other Ambulatory Visit (HOSPITAL_COMMUNITY): Payer: Self-pay

## 2022-08-07 ENCOUNTER — Other Ambulatory Visit: Payer: Self-pay | Admitting: Nurse Practitioner

## 2022-08-07 DIAGNOSIS — J069 Acute upper respiratory infection, unspecified: Secondary | ICD-10-CM

## 2022-08-07 DIAGNOSIS — R052 Subacute cough: Secondary | ICD-10-CM

## 2022-08-07 MED ORDER — METHYLPREDNISOLONE 4 MG PO TBPK
ORAL_TABLET | ORAL | 0 refills | Status: DC
  Filled 2022-08-07: qty 21, 6d supply, fill #0

## 2022-08-07 MED ORDER — HYDROCOD POLI-CHLORPHE POLI ER 10-8 MG/5ML PO SUER
5.0000 mL | Freq: Two times a day (BID) | ORAL | 0 refills | Status: DC | PRN
  Filled 2022-08-07 – 2022-08-28 (×2): qty 115, 12d supply, fill #0

## 2022-08-07 MED ORDER — CEFUROXIME AXETIL 500 MG PO TABS
500.0000 mg | ORAL_TABLET | Freq: Two times a day (BID) | ORAL | 0 refills | Status: DC
  Filled 2022-08-07: qty 14, 7d supply, fill #0

## 2022-08-07 NOTE — Telephone Encounter (Signed)
Called pt she is advised of her paperwork that is completed. it will be faxed and a copy is up front in folder ready for pickup

## 2022-08-15 ENCOUNTER — Other Ambulatory Visit (HOSPITAL_COMMUNITY): Payer: Self-pay

## 2022-08-27 ENCOUNTER — Ambulatory Visit: Payer: No Typology Code available for payment source

## 2022-08-27 DIAGNOSIS — R55 Syncope and collapse: Secondary | ICD-10-CM

## 2022-08-28 ENCOUNTER — Other Ambulatory Visit (HOSPITAL_COMMUNITY): Payer: Self-pay

## 2022-08-28 LAB — CUP PACEART REMOTE DEVICE CHECK
Date Time Interrogation Session: 20240419230344
Implantable Pulse Generator Implant Date: 20220216

## 2022-08-29 ENCOUNTER — Ambulatory Visit (INDEPENDENT_AMBULATORY_CARE_PROVIDER_SITE_OTHER): Payer: 59 | Admitting: Nurse Practitioner

## 2022-08-29 ENCOUNTER — Other Ambulatory Visit (HOSPITAL_COMMUNITY): Payer: Self-pay

## 2022-08-29 ENCOUNTER — Encounter: Payer: Self-pay | Admitting: Nurse Practitioner

## 2022-08-29 ENCOUNTER — Telehealth: Payer: Self-pay

## 2022-08-29 VITALS — BP 90/60 | HR 75 | Ht 68.0 in | Wt 180.4 lb

## 2022-08-29 DIAGNOSIS — R052 Subacute cough: Secondary | ICD-10-CM

## 2022-08-29 DIAGNOSIS — K581 Irritable bowel syndrome with constipation: Secondary | ICD-10-CM

## 2022-08-29 DIAGNOSIS — J069 Acute upper respiratory infection, unspecified: Secondary | ICD-10-CM

## 2022-08-29 DIAGNOSIS — U071 COVID-19: Secondary | ICD-10-CM | POA: Diagnosis not present

## 2022-08-29 MED ORDER — DOXYCYCLINE HYCLATE 100 MG PO TABS
100.0000 mg | ORAL_TABLET | Freq: Two times a day (BID) | ORAL | 0 refills | Status: DC
  Filled 2022-08-29: qty 20, 10d supply, fill #0

## 2022-08-29 MED ORDER — HYDROCOD POLI-CHLORPHE POLI ER 10-8 MG/5ML PO SUER
5.0000 mL | Freq: Two times a day (BID) | ORAL | 0 refills | Status: DC | PRN
  Filled 2022-08-29: qty 115, 12d supply, fill #0

## 2022-08-29 MED ORDER — LUBIPROSTONE 24 MCG PO CAPS
24.0000 ug | ORAL_CAPSULE | Freq: Two times a day (BID) | ORAL | 2 refills | Status: AC
  Filled 2022-08-29: qty 60, 30d supply, fill #0

## 2022-08-29 MED ORDER — METHYLPREDNISOLONE 4 MG PO TBPK
ORAL_TABLET | ORAL | 0 refills | Status: DC
  Filled 2022-08-29: qty 21, 6d supply, fill #0

## 2022-08-29 NOTE — Telephone Encounter (Signed)
[place] forms came via fax, patient is aware of $29 fee and will pay once forms are complete, and forms placed in [place] box, thanks.

## 2022-08-29 NOTE — Progress Notes (Signed)
Established patient visit   Patient: [name] [name]   DOB: 07/31/1980   42 y.o. Female  MRN: [id number] Visit Date: 08/29/2022   Chief Complaint  Patient presents with   Cough   Subjective    The patient was treated late March/early April for infection associated with COVID 19. Treated with Cerftin and Medrol taper.  Felt slightly better, but symptoms did come right back after finishing these medication.  Initially went out of work July 10, 2021 and short term disability papers kept her out of work through Sep 05, 2022.  At this point, she is not able to return to work next week.  Short term disability company is requesting extension of short term disability  Her employer would also like FMLA paperwork to be updated prior to 09/05/2022, as she does have a chronic heart condition for which she has intermittent accommodations. They would like for her papers to reflect accommodations be continuous rather than intermittent.   Cough This is a recurrent problem. The current episode started 1 to 4 weeks ago. The problem has been unchanged. The problem occurs every few minutes. Associated symptoms include nasal congestion, postnasal drip, rhinorrhea, a sore throat, shortness of breath and wheezing. She has tried rest and oral steroids (antibiotics) for the symptoms. The treatment provided mild relief.       Medications: Outpatient Medications Prior to Visit  Medication Sig   APPLE CIDER VINEGAR PO Take 1 tablet by mouth daily.   aspirin EC 81 MG tablet Take 81 mg by mouth every other day. Swallow whole.   Biotin w/ Vitamins C & E (HAIR/SKIN/NAILS PO) Take 1 tablet by mouth daily.    clonazePAM (KLONOPIN) 0.5 MG tablet Take 1 tablet by mouth 2  times daily as needed for anxiety.   cyanocobalamin (VITAMIN B12) 1000 MCG/ML injection Inject 1 mL (1,000 mcg total) into the skin every 14 (fourteen) days.   fluconazole (DIFLUCAN) 150 MG tablet Take 1 tablet by mouth once. May repeat dose in 3 days as  needed for persistent symptoms.   levonorgestrel (MIRENA) 20 MCG/24HR IUD 1 each by Intrauterine route once.   Multiple Vitamins-Minerals (BARIATRIC MULTIVITAMINS/IRON PO) Take 1 tablet by mouth in the morning and at bedtime.   ondansetron (ZOFRAN-ODT) 4 MG disintegrating tablet Take 1 tablet (4 mg total) by mouth every 4 (four) hours as needed for nausea or vomiting.   ondansetron (ZOFRAN-ODT) 4 MG disintegrating tablet Dissolve 1 tablet by mouth every 8 hours as needed for nausea or vomiting.   Probiotic Product (PROBIOTIC ADVANCED PO) Take 1 capsule by mouth daily.    rosuvastatin (CRESTOR) 5 MG tablet Take 1 tablet (5 mg total) by mouth daily.   SUMAtriptan (IMITREX) 50 MG tablet Take 1 tablet by mouth  at onset of headache. May repeat in 2 hours if symptoms are persistent   SYRINGE-NEEDLE, DISP, 3 ML (B-D 3CC LUER-LOK SYR 25GX5/8") 25G X 5/8" 3 ML MISC Use to inject b12 every other week.   tirzepatide (MOUNJARO) 7.5 MG/0.5ML Pen Inject 7.5 mg into the skin once a week.   Vitamin D, Ergocalciferol, (DRISDOL) 1.25 MG (50000 UNIT) CAPS capsule Take 1 capsule (50,000 Units total) by mouth every 7 (seven) days.   [DISCONTINUED] chlorpheniramine-HYDROcodone (TUSSIONEX) 10-8 MG/5ML Take 5 mLs by mouth every 12 (twelve) hours as needed for cough.   [DISCONTINUED] cefUROXime (CEFTIN) 500 MG tablet Take 1 tablet (500 mg total) by mouth 2 (two) times daily with a meal. (Patient not taking: Reported on 08/29/2022)   [  DISCONTINUED] methylPREDNISolone (MEDROL) 4 MG TBPK tablet Take by mouth as directed for 6 days (Patient not taking: Reported on 08/29/2022)   No facility-administered medications prior to visit.    Review of Systems  HENT:  Positive for postnasal drip, rhinorrhea and sore throat.   Respiratory:  Positive for cough, shortness of breath and wheezing.        Objective     Today's Vitals   08/29/22 1311  BP: 90/60  Pulse: 75  SpO2: 100%  Weight: 180 lb 6.4 oz (81.8 kg)  Height: 5'  8" (1.727 m)   Body mass index is 27.43 kg/m.  BP Readings from Last 3 Encounters:  08/29/22 90/60  07/31/22 (Abnormal) 96/58  07/23/22 98/62    Wt Readings from Last 3 Encounters:  08/29/22 180 lb 6.4 oz (81.8 kg)  07/31/22 174 lb (78.9 kg)  07/23/22 174 lb (78.9 kg)    Physical Exam Vitals and nursing note reviewed.  Constitutional:      Appearance: Normal appearance. She is well-developed. She is ill-appearing.  HENT:     Head: Normocephalic and atraumatic.     Nose: Congestion present.     Mouth/Throat:     Mouth: Mucous membranes are moist.     Pharynx: Oropharynx is clear. Posterior oropharyngeal erythema present.  Eyes:     Extraocular Movements: Extraocular movements intact.     Conjunctiva/sclera: Conjunctivae normal.     Pupils: Pupils are equal, round, and reactive to light.  Neck:     Vascular: No carotid bruit.  Cardiovascular:     Rate and Rhythm: Regular rhythm. Tachycardia present.     Pulses: Normal pulses.     Heart sounds: Normal heart sounds.  Pulmonary:     Effort: Pulmonary effort is normal.     Breath sounds: Wheezing present.     Comments: Harsh, dry cough noted throughout the visit.  Abdominal:     Palpations: Abdomen is soft.  Musculoskeletal:        General: Normal range of motion.     Cervical back: Normal range of motion and neck supple.  Lymphadenopathy:     Cervical: Cervical adenopathy present.  Skin:    General: Skin is warm and dry.     Capillary Refill: Capillary refill takes less than 2 seconds.  Neurological:     General: No focal deficit present.     Mental Status: She is alert and oriented to person, place, and time.  Psychiatric:        Mood and Affect: Mood normal.        Behavior: Behavior normal.        Thought Content: Thought content normal.        Judgment: Judgment normal.      Assessment & Plan    Upper respiratory tract infection due to COVID-19 virus Assessment & Plan: Start doxycycline 100 mg bid for 10  days. Repeat medrol taper. Take as directed for 6 days. Rest and increase fluids. Continue using OTC medication to control symptoms.  Advised her to closely monitor her blood sugar while taking medrol as this may increase blood sugar. If it does, she should discontinue use and contact the office. She voiced understanding and agreement.  -will get chest x-ray for further evaluation.  -extend disability paperwork through 10/05/2022  Orders: -     DG Chest 2 View; Future -     Doxycycline Hyclate; Take 1 tablet (100 mg total) by mouth 2 (two) times daily.  Dispense:  20 tablet; Refill: 0 -     methylPREDNISolone; Take by mouth as directed for 6 days  Dispense: 21 tablet; Refill: 0  Subacute cough Assessment & Plan: -may take tussionex cough suppressant twice daily as needed for cough.  -Advised patient not to overuse this medicine and not to mix with other medications or alcohol as it can cause respiratory distress, sleepiness or dizziness. Should also avoid driving. Patient voiced understanding and agreement.   -chest x-ray ordered for further evaluation.   Orders: -     DG Chest 2 View; Future -     Doxycycline Hyclate; Take 1 tablet (100 mg total) by mouth 2 (two) times daily.  Dispense: 20 tablet; Refill: 0 -     Hydrocod Poli-Chlorphe Poli ER; Take 5 mLs by mouth every 12 (twelve) hours as needed for cough.  Dispense: 115 mL; Refill: 0  Irritable bowel syndrome with constipation Assessment & Plan: Trial amitiza 24 mcg twice daily with food.   Orders: -     Lubiprostone; Take 1 capsule (24 mcg total) by mouth 2 (two) times daily with a meal.  Dispense: 60 capsule; Refill: 2     Return for prn worsening or persistent symptoms.         Carlean Jews, NP  [facility] at [facility] [phone] (phone) [phone] (fax)  [facility]

## 2022-08-30 ENCOUNTER — Other Ambulatory Visit: Payer: Self-pay

## 2022-08-30 ENCOUNTER — Other Ambulatory Visit (HOSPITAL_COMMUNITY): Payer: Self-pay

## 2022-08-30 ENCOUNTER — Ambulatory Visit
Admission: RE | Admit: 2022-08-30 | Discharge: 2022-08-30 | Disposition: A | Discharge status: Home / Self Care | Payer: 59 | Source: Ambulatory Visit | Attending: Nurse Practitioner | Admitting: Nurse Practitioner

## 2022-08-30 DIAGNOSIS — R052 Subacute cough: Secondary | ICD-10-CM

## 2022-08-30 DIAGNOSIS — R062 Wheezing: Secondary | ICD-10-CM | POA: Diagnosis not present

## 2022-08-30 DIAGNOSIS — R059 Cough, unspecified: Secondary | ICD-10-CM | POA: Diagnosis not present

## 2022-08-30 DIAGNOSIS — U071 COVID-19: Secondary | ICD-10-CM

## 2022-08-30 NOTE — Telephone Encounter (Signed)
[name] [name] is advised of the paperwork it was handed to her 08/29/2022

## 2022-09-03 ENCOUNTER — Encounter: Payer: Self-pay | Admitting: Nurse Practitioner

## 2022-09-05 ENCOUNTER — Other Ambulatory Visit (HOSPITAL_COMMUNITY): Payer: Self-pay

## 2022-09-05 ENCOUNTER — Telehealth: Payer: Self-pay | Admitting: *Deleted

## 2022-09-05 NOTE — Telephone Encounter (Signed)
Pt calling to check the status of the paperwork she requested.

## 2022-09-05 NOTE — Progress Notes (Signed)
[facility] Summary Report / Loop Recorder 

## 2022-09-06 DIAGNOSIS — R052 Subacute cough: Secondary | ICD-10-CM | POA: Insufficient documentation

## 2022-09-06 NOTE — Assessment & Plan Note (Signed)
Start doxycycline 100 mg bid for 10 days. Repeat medrol taper. Take as directed for 6 days. Rest and increase fluids. Continue using OTC medication to control symptoms.  Advised her to closely monitor her blood sugar while taking medrol as this may increase blood sugar. If it does, she should discontinue use and contact the office. She voiced understanding and agreement.  -will get chest x-ray for further evaluation.  -extend disability paperwork through 10/05/2022

## 2022-09-06 NOTE — Telephone Encounter (Signed)
Pt informed and updated appointment note to collect the $29.00 dollar form completion fee.

## 2022-09-06 NOTE — Telephone Encounter (Signed)
I gave the completed paperwork to Katrina this morning after completing it.

## 2022-09-06 NOTE — Assessment & Plan Note (Signed)
-  may take tussionex cough suppressant twice daily as needed for cough.  -Advised patient not to overuse this medicine and not to mix with other medications or alcohol as it can cause respiratory distress, sleepiness or dizziness. Should also avoid driving. Patient voiced understanding and agreement.   -chest x-ray ordered for further evaluation.

## 2022-09-06 NOTE — Telephone Encounter (Signed)
Paperwork has been faxed on 09/06/2022 @10 :05

## 2022-09-06 NOTE — Telephone Encounter (Signed)
Pt calling again in reference to paperwork.  She said to apologize to the provider for having to bother her so much but that HR keeps calling her about the papers.  She said it was due [name] [name] 30.

## 2022-09-06 NOTE — Assessment & Plan Note (Signed)
Trial amitiza 24 mcg twice daily with food.

## 2022-09-07 ENCOUNTER — Other Ambulatory Visit (HOSPITAL_COMMUNITY): Payer: Self-pay

## 2022-09-27 LAB — CUP PACEART REMOTE DEVICE CHECK
Date Time Interrogation Session: 20240522230719
Implantable Pulse Generator Implant Date: 20220216

## 2022-10-02 ENCOUNTER — Ambulatory Visit (INDEPENDENT_AMBULATORY_CARE_PROVIDER_SITE_OTHER): Payer: No Typology Code available for payment source

## 2022-10-02 DIAGNOSIS — R55 Syncope and collapse: Secondary | ICD-10-CM

## 2022-10-03 NOTE — Progress Notes (Signed)
[facility] Summary Report / Loop Recorder 

## 2022-10-04 ENCOUNTER — Ambulatory Visit: Payer: 59 | Admitting: Nurse Practitioner

## 2022-10-04 NOTE — Progress Notes (Deleted)
Established patient visit   Patient: [name] [name]   DOB: [birth year]   42 y.o. Female  MRN: [id number] Visit Date: 10/04/2022   No chief complaint on file.  Subjective    HPI  Follow up  -post COVID URI -FMLA paperwork kept  her out of work through 10/05/2022   Medications: Outpatient Medications Prior to Visit  Medication Sig   APPLE CIDER VINEGAR PO Take 1 tablet by mouth daily.   aspirin EC 81 MG tablet Take 81 mg by mouth every other day. Swallow whole.   Biotin w/ Vitamins C & E (HAIR/SKIN/NAILS PO) Take 1 tablet by mouth daily.    chlorpheniramine-HYDROcodone (TUSSIONEX) 10-8 MG/5ML Take 5 mLs by mouth every 12 (twelve) hours as needed for cough.   clonazePAM (KLONOPIN) 0.5 MG tablet Take 1 tablet by mouth 2  times daily as needed for anxiety.   cyanocobalamin (VITAMIN B12) 1000 MCG/ML injection Inject 1 mL (1,000 mcg total) into the skin every 14 (fourteen) days.   doxycycline (VIBRA-TABS) 100 MG tablet Take 1 tablet (100 mg total) by mouth 2 (two) times daily.   fluconazole (DIFLUCAN) 150 MG tablet Take 1 tablet by mouth once. May repeat dose in 3 days as needed for persistent symptoms.   levonorgestrel (MIRENA) 20 MCG/24HR IUD 1 each by Intrauterine route once.   lubiprostone (AMITIZA) 24 MCG capsule Take 1 capsule (24 mcg total) by mouth 2 (two) times daily with a meal.   methylPREDNISolone (MEDROL) 4 MG TBPK tablet Take by mouth as directed for 6 days   Multiple Vitamins-Minerals (BARIATRIC MULTIVITAMINS/IRON PO) Take 1 tablet by mouth in the morning and at bedtime.   ondansetron (ZOFRAN-ODT) 4 MG disintegrating tablet Take 1 tablet (4 mg total) by mouth every 4 (four) hours as needed for nausea or vomiting.   ondansetron (ZOFRAN-ODT) 4 MG disintegrating tablet Dissolve 1 tablet by mouth every 8 hours as needed for nausea or vomiting.   Probiotic Product (PROBIOTIC ADVANCED PO) Take 1 capsule by mouth daily.    rosuvastatin (CRESTOR) 5 MG tablet Take 1 tablet (5 mg total)  by mouth daily.   SUMAtriptan (IMITREX) 50 MG tablet Take 1 tablet by mouth  at onset of headache. May repeat in 2 hours if symptoms are persistent   SYRINGE-NEEDLE, DISP, 3 ML (B-D 3CC LUER-LOK SYR 25GX5/8") 25G X 5/8" 3 ML MISC Use to inject b12 every other week.   tirzepatide (MOUNJARO) 7.5 MG/0.5ML Pen Inject 7.5 mg into the skin once a week.   Vitamin D, Ergocalciferol, (DRISDOL) 1.25 MG (50000 UNIT) CAPS capsule Take 1 capsule (50,000 Units total) by mouth every 7 (seven) days.   No facility-administered medications prior to visit.    Review of Systems  {Labs (Optional):23779}   Objective    There were no vitals filed for this visit. There is no height or weight on file to calculate BMI.  BP Readings from Last 3 Encounters:  08/29/22 90/60  07/31/22 (Abnormal) 96/58  07/23/22 98/62    Wt Readings from Last 3 Encounters:  08/29/22 180 lb 6.4 oz (81.8 kg)  07/31/22 174 lb (78.9 kg)  07/23/22 174 lb (78.9 kg)    Physical Exam  ***  No results found for any visits on 10/04/22.  Assessment & Plan    There are no diagnoses linked to this encounter.   No follow-ups on file.         Carlean Jews, NP  [facility] at [facility] [phone] (phone) [phone] (  fax)  [facility]

## 2022-10-15 ENCOUNTER — Ambulatory Visit (INDEPENDENT_AMBULATORY_CARE_PROVIDER_SITE_OTHER): Payer: 59 | Admitting: Physician Assistant

## 2022-10-26 NOTE — Progress Notes (Signed)
[facility] Summary Report / Loop Recorder 

## 2022-10-30 LAB — CUP PACEART REMOTE DEVICE CHECK
Date Time Interrogation Session: 20240624230405
Implantable Pulse Generator Implant Date: 20220216

## 2022-11-05 ENCOUNTER — Ambulatory Visit (INDEPENDENT_AMBULATORY_CARE_PROVIDER_SITE_OTHER): Payer: No Typology Code available for payment source

## 2022-11-05 DIAGNOSIS — R55 Syncope and collapse: Secondary | ICD-10-CM

## 2022-11-13 ENCOUNTER — Ambulatory Visit (INDEPENDENT_AMBULATORY_CARE_PROVIDER_SITE_OTHER): Payer: 59 | Admitting: Physician Assistant

## 2022-11-15 ENCOUNTER — Encounter (INDEPENDENT_AMBULATORY_CARE_PROVIDER_SITE_OTHER): Payer: Self-pay | Admitting: Physician Assistant

## 2022-11-15 ENCOUNTER — Telehealth (INDEPENDENT_AMBULATORY_CARE_PROVIDER_SITE_OTHER): Payer: Self-pay

## 2022-11-15 ENCOUNTER — Ambulatory Visit (INDEPENDENT_AMBULATORY_CARE_PROVIDER_SITE_OTHER): Payer: No Typology Code available for payment source | Admitting: Physician Assistant

## 2022-11-15 VITALS — BP 78/62 | HR 97 | Temp 98.2°F | Ht 68.0 in | Wt 176.0 lb

## 2022-11-15 DIAGNOSIS — Z7985 Long-term (current) use of injectable non-insulin antidiabetic drugs: Secondary | ICD-10-CM

## 2022-11-15 DIAGNOSIS — E119 Type 2 diabetes mellitus without complications: Secondary | ICD-10-CM | POA: Diagnosis not present

## 2022-11-15 DIAGNOSIS — E559 Vitamin D deficiency, unspecified: Secondary | ICD-10-CM

## 2022-11-15 DIAGNOSIS — Z6826 Body mass index (BMI) 26.0-26.9, adult: Secondary | ICD-10-CM

## 2022-11-15 DIAGNOSIS — E663 Overweight: Secondary | ICD-10-CM | POA: Insufficient documentation

## 2022-11-15 DIAGNOSIS — R110 Nausea: Secondary | ICD-10-CM

## 2022-11-15 DIAGNOSIS — E538 Deficiency of other specified B group vitamins: Secondary | ICD-10-CM | POA: Diagnosis not present

## 2022-11-15 DIAGNOSIS — Z6827 Body mass index (BMI) 27.0-27.9, adult: Secondary | ICD-10-CM | POA: Insufficient documentation

## 2022-11-15 DIAGNOSIS — E669 Obesity, unspecified: Secondary | ICD-10-CM

## 2022-11-15 DIAGNOSIS — Z9884 Bariatric surgery status: Secondary | ICD-10-CM

## 2022-11-15 MED ORDER — ONDANSETRON 4 MG PO TBDP: 4.0000 mg | ORAL_TABLET | ORAL | 1 refills | Status: DC | PRN

## 2022-11-15 MED ORDER — "BD LUER-LOK SYRINGE 25G X 5/8"" 3 ML MISC": 1.0000 | 0 refills | Status: DC

## 2022-11-15 MED ORDER — TIRZEPATIDE 5 MG/0.5ML ~~LOC~~ SOAJ: 5.0000 mg | SUBCUTANEOUS | 0 refills | Status: DC

## 2022-11-15 MED ORDER — CYANOCOBALAMIN 1000 MCG/ML IJ SOLN: 1000.0000 ug | INTRAMUSCULAR | 0 refills | Status: DC

## 2022-11-15 MED ORDER — VITAMIN D (ERGOCALCIFEROL) 1.25 MG (50000 UNIT) PO CAPS: 50000.0000 [IU] | ORAL_CAPSULE | ORAL | 0 refills | Status: DC

## 2022-11-15 NOTE — Progress Notes (Signed)
.smr  Office: [phone]  /  Fax: [phone]  WEIGHT SUMMARY AND BIOMETRICS  Vitals Temp: 98.2 F (36.8 C) BP: (!) 78/62 Pulse Rate: 97 SpO2: 99 %   Anthropometric Measurements Height: 5\' 8"  (1.727 m) Weight: 176 lb (79.8 kg) BMI (Calculated): 26.77 Weight at Last Visit: 178 lb Weight Lost Since Last Visit: 2 Starting Weight: 243 lb Total Weight Loss (lbs): 67 lb (30.4 kg)   Body Composition  Body Fat %: 35.5 % Fat Mass (lbs): 62.6 lbs Muscle Mass (lbs): 107.8 lbs Total Body Water (lbs): 81.6 lbs Visceral Fat Rating : 6   Other Clinical Data Fasting: no Labs: no Today's Visit #: 32 Starting Date: 06/18/19     HPI  Chief Complaint: OBESITY  [name] [name] is here to discuss her progress with her obesity treatment plan. She is on the the Category 1 Plan and states she is following her eating plan approximately 50 % of the time. She states she is exercising 15-20 minutes 3 times per week.   Interval History:  Since last office visit she is down 2 lbs.  Bioimpedance scale reviewed with patient: Up 1 pound muscle mass  Down 3.2 pounds adipose mass Down 3 pounds total body water  Hunger/appetite-reports decreased appetite and decreased hunger signals.  She is chronically been unable to tolerate larger volumes due to her previous bariatric surgery.  She is supplementing with Premier protein shakes 1-2 times daily.  She does occasionally skip meals but is very mindful of her protein intake. Cravings-increased sweets cravings recently [name] [name] has just started a new job with [employer] and is able to work from home which is working out very well for her  Exercise-exercise remains limited due to long COVID symptoms.  She does normally wear extensive lower body and abdominal compression. Hydration-hydrates well due to long COVID symptoms.  She continues to follow regularly with cardiology and on continuous monitoring.  There has been discussion of possible  need for pacemaker but nothing definitive at this point.   Pharmacotherapy: Mounjaro 7.5 mg weekly. Denies mass in neck, dysphagia, dyspepsia, persistent hoarseness, abdominal pain, or vomiting or diarrhea. Has annual eye exam. Mood is stable.  Has chronic constipation issues which have improved on Amitiza 24 mcg- taking twice a week currently with good result. Some nausea and has been on Zofran prn and needs refill today.   TREATMENT PLAN FOR OBESITY:  Recommended Dietary Goals  [name] [name] is currently in the action stage of change. As such, her goal is to continue weight management plan. She has agreed to the Category 1 Plan.  Behavioral Intervention  We discussed the following Behavioral Modification Strategies today: increasing lean protein intake, decreasing simple carbohydrates , increasing vegetables, increasing lower glycemic fruits, avoiding skipping meals, increasing water intake, continue to practice mindfulness when eating, and planning for success.  Additional resources provided today: NA  Recommended Physical Activity Goals  [name] [name] has been advised to work up to 150 minutes of moderate intensity aerobic activity a week and strengthening exercises 2-3 times per week for cardiovascular health, weight loss maintenance and preservation of muscle mass.   She has agreed to Continue current level of physical activity  and Think about ways to increase daily physical activity and overcoming barriers to exercise   Pharmacotherapy We discussed various medication options to help [name] [name] with her weight loss efforts and we both agreed to decrease Mounjaro to 5 mg weekly.    Return in about 4 weeks (around 12/13/2022).[name] [name] She was informed of the  importance of frequent follow up visits to maximize her success with intensive lifestyle modifications for her multiple health conditions.  PHYSICAL EXAM:  Blood pressure (!) 78/62, pulse 97, temperature 98.2 F (36.8 C), height 5\' 8"  (1.727 m),  weight 176 lb (79.8 kg), SpO2 99%. Body mass index is 26.76 kg/m.  General: She is overweight, cooperative, alert, well developed, and in no acute distress. PSYCH: Has normal mood, affect and thought process.   Cardiovascular: Rechecked BP manually by Provider 98/62, HR 90's regular . Pt. Reports normally wears more lower body compression, but not wearing usual compression due to need to weigh today.  Lungs: Normal breathing effort, no conversational dyspnea. Neuro: no focal deficits  DIAGNOSTIC DATA REVIEWED:  BMET    Component Value Date/Time   NA 139 07/23/2022 0856   NA 139 06/13/2022 1046   K 4.0 07/23/2022 0856   CL 105 07/23/2022 0845   CO2 27 07/23/2022 0845   GLUCOSE 82 07/23/2022 0845   BUN 14 07/23/2022 0845   BUN 21 06/13/2022 1046   CREATININE 1.01 (H) 07/23/2022 0845   CREATININE 0.77 03/14/2016 1409   CALCIUM 9.6 07/23/2022 0845   GFRNONAA >60 07/23/2022 0845   GFRAA >60 09/30/2019 0801   Lab Results  Component Value Date   HGBA1C 5.2 04/11/2022   HGBA1C  09/02/2008    5.8 (NOTE) The ADA recommends the following therapeutic goal for glycemic control related to Hgb A1c measurement: Goal of therapy: <6.5 Hgb A1c  Reference: American Diabetes Association: Clinical Practice Recommendations 2010, Diabetes Care, 2010, 33: (Suppl  1).   Lab Results  Component Value Date   INSULIN 6.9 04/11/2022   INSULIN 10.1 06/18/2019   Lab Results  Component Value Date   TSH 1.270 04/11/2022   CBC    Component Value Date/Time   WBC 5.5 07/23/2022 0845   RBC 4.51 07/23/2022 0845   HGB 11.9 (L) 07/23/2022 0856   HGB 14.9 06/13/2022 1046   HCT 35.0 (L) 07/23/2022 0856   HCT 44.1 06/13/2022 1046   PLT 182 07/23/2022 0845   PLT 308 06/13/2022 1046   MCV 84.3 07/23/2022 0845   MCV 86 06/13/2022 1046   MCH 29.9 07/23/2022 0845   MCHC 35.5 07/23/2022 0845   RDW 12.1 07/23/2022 0845   RDW 12.7 06/13/2022 1046   Iron Studies    Component Value Date/Time   IRON 69  07/05/2021 1531   TIBC 267 07/05/2021 1531   FERRITIN 65 07/05/2021 1531   IRONPCTSAT 26 07/05/2021 1531   Lipid Panel     Component Value Date/Time   CHOL 208 (H) 04/11/2022 0858   TRIG 86 04/11/2022 0858   HDL 60 04/11/2022 0858   CHOLHDL 3.3 06/28/2021 0803   CHOLHDL 3.5 03/23/2018 0623   VLDL 8 03/23/2018 0623   LDLCALC 133 (H) 04/11/2022 0858   LDLDIRECT 141 (H) 09/23/2020 1130   Hepatic Function Panel     Component Value Date/Time   PROT 7.3 04/11/2022 0858   ALBUMIN 4.4 04/11/2022 0858   AST 11 04/11/2022 0858   ALT 10 04/11/2022 0858   ALKPHOS 67 04/11/2022 0858   BILITOT 0.4 04/11/2022 0858      Component Value Date/Time   TSH 1.270 04/11/2022 0858   Nutritional Lab Results  Component Value Date   VD25OH 42.9 04/11/2022   VD25OH 58.7 12/06/2021   VD25OH 83.7 06/28/2021    ASSOCIATED CONDITIONS ADDRESSED TODAY  ASSESSMENT AND PLAN  Problem List Items Addressed This Visit  Generalized obesity   Relevant Medications   tirzepatide [facility]) 5 MG/0.5ML Pen   Type 2 diabetes mellitus without complication, without long-term current use of insulin (HCC) - Primary   Relevant Medications   SYRINGE-NEEDLE, DISP, 3 ML (B-D 3CC LUER-LOK SYR 25GX5/8") 25G X 5/8" 3 ML MISC   tirzepatide (MOUNJARO) 5 MG/0.5ML Pen   Other Relevant Orders   CMP14+EGFR   Hemoglobin A1c   Insulin, random   Lipid Panel With LDL/HDL Ratio   TSH   Nausea   Relevant Medications   ondansetron (ZOFRAN-ODT) 4 MG disintegrating tablet   Vitamin D deficiency   Relevant Medications   Vitamin D, Ergocalciferol, (DRISDOL) 1.25 MG (50000 UNIT) CAPS capsule   Other Relevant Orders   VITAMIN D 25 Hydroxy (Vit-D Deficiency, Fractures)   B12 deficiency   Relevant Medications   cyanocobalamin (VITAMIN B12) 1000 MCG/ML injection   Other Relevant Orders   Vitamin B12   CBC with Differential/Platelet   S/P bariatric surgery   Relevant Orders   Folate   Iron and TIBC   Ferritin   BMI  26.0-26.9,adult Current BMI 26.8   Type 2 Diabetes Mellitus with other specified complication, without long-term current use of insulin HgbA1c is at goal. Last A1c was 5.2  Medication(s): Mounjaro 7.5 mg SQ weekly Denies mass in neck, dysphagia, dyspepsia, persistent hoarseness, abdominal pain, or vomiting or diarrhea. Has annual eye exam. Mood is stable.  Has chronic constipation issues which have improved on Amitiza 24 mcg- taking twice a week currently with good result. Some nausea and has been on Zofran prn and needs refill today.  We discussed the importance of regular meals and adequate protein intake for weight loss and overall health.   Lab Results  Component Value Date   HGBA1C 5.2 04/11/2022   HGBA1C 5.7 (H) 12/06/2021   HGBA1C 5.2 06/28/2021   Lab Results  Component Value Date   MICROALBUR 30 05/23/2022   LDLCALC 133 (H) 04/11/2022   CREATININE 1.01 (H) 07/23/2022   No results found for: "GFR"  Plan: Recheck fasting labs over the next week- She plans to come in over the next few days for lab draw.  Continue and decrease dose Mounjaro 5.0 mg SQ weekly Continue working on nutrition plan to decrease simple carbohydrates, increase lean proteins and exercise to promote weight loss, and improve glycemic control.   S/P Bariatric Surgery: Taking liquid multivitamins. Focuses on getting adequate protein. Has never been able to tolerate larger volumes following surgery as expected.  Some nausea, ? Mounjaro related. We will decrease Mounjaro dosage and monitor closely. Has not seen general surgery for the past few years. May need imaging /other work up .  Plan: Will recheck labs- She plans to come in fasting early next week for labs.  Will monitor closely. Zofran prn for now, taking episodically, but will need to follow up with General Surgery or GI.   Vitamin D Deficiency Vitamin D is not at goal of 50.  Most recent vitamin D level was 42.9. She is on  prescription  ergocalciferol 50,000 IU weekly. No N/V  or muscle weakness or other side effects with vitamin D.  Lab Results  Component Value Date   VD25OH 42.9 04/11/2022   VD25OH 58.7 12/06/2021   VD25OH 83.7 06/28/2021    Plan: Continue and refill  prescription ergocalciferol 50,000 IU weekly Low vitamin D levels can be associated with adiposity and may result in leptin resistance and weight gain. Also associated with fatigue. Currently on  vitamin D supplementation without any adverse effects.  Recheck labs over the next week and adjust supplements accordingly.   B 12 deficiency:  On bimonthly B 12 injection. Her PCP has left and asked for refill today. Reports no side effects with injection. Notes some increased ease of bruising lately.  Endorses fatigue.  Plan: Recheck labs, CBC, B12 and anemia panel over the next week.  Continue/refill B 12 injections x 90 days.   ATTESTASTION STATEMENTS:  Reviewed by clinician on day of visit: allergies, medications, problem list, medical history, surgical history, family history, social history, and previous encounter notes.   I have personally spent 50 minutes total time today in preparation, patient care, nutritional counseling and documentation for this visit, including the following: review of clinical lab tests; review of medical tests/procedures/services.      Jossalin Chervenak, PA-C

## 2022-11-15 NOTE — Telephone Encounter (Signed)
See my chart message

## 2022-11-15 NOTE — Telephone Encounter (Signed)
MOUNJARO INJ 5MG /0.5, use as directed (2 per month), is approved through 11/15/2023.

## 2022-11-19 ENCOUNTER — Other Ambulatory Visit (INDEPENDENT_AMBULATORY_CARE_PROVIDER_SITE_OTHER): Payer: Self-pay | Admitting: Physician Assistant

## 2022-11-19 ENCOUNTER — Other Ambulatory Visit (HOSPITAL_COMMUNITY): Payer: Self-pay

## 2022-11-19 DIAGNOSIS — E119 Type 2 diabetes mellitus without complications: Secondary | ICD-10-CM

## 2022-11-19 MED ORDER — TIRZEPATIDE 5 MG/0.5ML ~~LOC~~ SOAJ
5.0000 mg | SUBCUTANEOUS | 0 refills | Status: DC
  Filled 2022-11-19 – 2022-12-28 (×2): qty 2, 28d supply, fill #0

## 2022-11-19 NOTE — Progress Notes (Unsigned)
Send Greggory Keen Rx to [facility] as not available at [facility].   Nyliah Nierenberg,PA-C

## 2022-11-19 NOTE — Addendum Note (Signed)
Addended by: Teressa Senter on: 11/19/2022 09:40 AM   Modules accepted: Orders

## 2022-11-19 NOTE — Telephone Encounter (Signed)
Letter from [facility], they are unable to fill the request for [facility] due to shortage.  They are requesting it be sent to a local pharmacy of the patients choice.  Call to patient to make her aware, patient is asking that it be sent to [facility].

## 2022-11-21 ENCOUNTER — Encounter (INDEPENDENT_AMBULATORY_CARE_PROVIDER_SITE_OTHER): Payer: Self-pay

## 2022-11-26 NOTE — Progress Notes (Signed)
[facility] Summary Report / Loop Recorder 

## 2022-11-27 ENCOUNTER — Other Ambulatory Visit (HOSPITAL_COMMUNITY): Payer: Self-pay

## 2022-12-10 ENCOUNTER — Ambulatory Visit (INDEPENDENT_AMBULATORY_CARE_PROVIDER_SITE_OTHER): Payer: No Typology Code available for payment source

## 2022-12-10 DIAGNOSIS — R55 Syncope and collapse: Secondary | ICD-10-CM | POA: Diagnosis not present

## 2022-12-17 NOTE — Progress Notes (Signed)
Patient not seen.

## 2022-12-18 ENCOUNTER — Encounter (INDEPENDENT_AMBULATORY_CARE_PROVIDER_SITE_OTHER): Payer: Self-pay

## 2022-12-18 ENCOUNTER — Ambulatory Visit (INDEPENDENT_AMBULATORY_CARE_PROVIDER_SITE_OTHER): Payer: No Typology Code available for payment source | Admitting: Physician Assistant

## 2022-12-18 DIAGNOSIS — E538 Deficiency of other specified B group vitamins: Secondary | ICD-10-CM

## 2022-12-18 DIAGNOSIS — E559 Vitamin D deficiency, unspecified: Secondary | ICD-10-CM

## 2022-12-18 DIAGNOSIS — R110 Nausea: Secondary | ICD-10-CM

## 2022-12-18 DIAGNOSIS — E785 Hyperlipidemia, unspecified: Secondary | ICD-10-CM

## 2022-12-18 DIAGNOSIS — E119 Type 2 diabetes mellitus without complications: Secondary | ICD-10-CM

## 2022-12-18 DIAGNOSIS — E1169 Type 2 diabetes mellitus with other specified complication: Secondary | ICD-10-CM

## 2022-12-18 DIAGNOSIS — E669 Obesity, unspecified: Secondary | ICD-10-CM

## 2022-12-24 ENCOUNTER — Other Ambulatory Visit: Payer: Self-pay | Admitting: Family Medicine

## 2022-12-24 DIAGNOSIS — Z1231 Encounter for screening mammogram for malignant neoplasm of breast: Secondary | ICD-10-CM

## 2022-12-25 NOTE — Progress Notes (Signed)
[facility] Summary Report / Loop Recorder 

## 2022-12-26 ENCOUNTER — Other Ambulatory Visit (INDEPENDENT_AMBULATORY_CARE_PROVIDER_SITE_OTHER): Payer: Self-pay | Admitting: Physician Assistant

## 2022-12-26 DIAGNOSIS — E559 Vitamin D deficiency, unspecified: Secondary | ICD-10-CM

## 2022-12-28 ENCOUNTER — Other Ambulatory Visit (HOSPITAL_COMMUNITY): Payer: Self-pay

## 2023-01-09 LAB — CUP PACEART REMOTE DEVICE CHECK
Date Time Interrogation Session: 20240903230641
Implantable Pulse Generator Implant Date: 20220216

## 2023-01-10 ENCOUNTER — Ambulatory Visit: Payer: No Typology Code available for payment source

## 2023-01-14 ENCOUNTER — Ambulatory Visit (INDEPENDENT_AMBULATORY_CARE_PROVIDER_SITE_OTHER): Payer: No Typology Code available for payment source

## 2023-01-14 DIAGNOSIS — R55 Syncope and collapse: Secondary | ICD-10-CM | POA: Diagnosis not present

## 2023-01-16 ENCOUNTER — Ambulatory Visit (INDEPENDENT_AMBULATORY_CARE_PROVIDER_SITE_OTHER): Payer: No Typology Code available for payment source | Admitting: Physician Assistant

## 2023-01-21 ENCOUNTER — Encounter (INDEPENDENT_AMBULATORY_CARE_PROVIDER_SITE_OTHER): Payer: Self-pay | Admitting: Physician Assistant

## 2023-01-21 ENCOUNTER — Ambulatory Visit (INDEPENDENT_AMBULATORY_CARE_PROVIDER_SITE_OTHER): Payer: No Typology Code available for payment source | Admitting: Physician Assistant

## 2023-01-21 ENCOUNTER — Other Ambulatory Visit (HOSPITAL_COMMUNITY): Payer: Self-pay

## 2023-01-21 VITALS — BP 95/62 | HR 58 | Temp 98.3°F | Ht 68.0 in | Wt 178.0 lb

## 2023-01-21 DIAGNOSIS — E119 Type 2 diabetes mellitus without complications: Secondary | ICD-10-CM | POA: Diagnosis not present

## 2023-01-21 DIAGNOSIS — R55 Syncope and collapse: Secondary | ICD-10-CM | POA: Diagnosis not present

## 2023-01-21 DIAGNOSIS — Z9884 Bariatric surgery status: Secondary | ICD-10-CM

## 2023-01-21 DIAGNOSIS — Z6827 Body mass index (BMI) 27.0-27.9, adult: Secondary | ICD-10-CM

## 2023-01-21 DIAGNOSIS — E538 Deficiency of other specified B group vitamins: Secondary | ICD-10-CM

## 2023-01-21 DIAGNOSIS — E669 Obesity, unspecified: Secondary | ICD-10-CM

## 2023-01-21 DIAGNOSIS — E559 Vitamin D deficiency, unspecified: Secondary | ICD-10-CM | POA: Diagnosis not present

## 2023-01-21 DIAGNOSIS — Z7985 Long-term (current) use of injectable non-insulin antidiabetic drugs: Secondary | ICD-10-CM

## 2023-01-21 MED ORDER — DEXCOM G6 TRANSMITTER MISC
1.0000 | 2 refills | Status: DC
Start: 2023-01-21 — End: 2023-04-01
  Filled 2023-01-21: qty 1, 30d supply, fill #0

## 2023-01-21 MED ORDER — TIRZEPATIDE 7.5 MG/0.5ML ~~LOC~~ SOAJ
7.5000 mg | SUBCUTANEOUS | 0 refills | Status: DC
Start: 2023-01-21 — End: 2023-03-13
  Filled 2023-01-21 – 2023-02-15 (×2): qty 2, 28d supply, fill #0

## 2023-01-21 MED ORDER — VITAMIN D (ERGOCALCIFEROL) 1.25 MG (50000 UNIT) PO CAPS
50000.0000 [IU] | ORAL_CAPSULE | ORAL | 0 refills | Status: DC
Start: 2023-01-21 — End: 2023-04-01
  Filled 2023-01-21 – 2023-02-15 (×3): qty 4, 28d supply, fill #0

## 2023-01-21 MED ORDER — CYANOCOBALAMIN 1000 MCG/ML IJ SOLN
1000.0000 ug | INTRAMUSCULAR | 0 refills | Status: DC
  Filled 2023-01-21 – 2023-02-15 (×3): qty 2, 28d supply, fill #0

## 2023-01-21 MED ORDER — DEXCOM G6 RECEIVER DEVI
1.0000 | 2 refills | Status: DC
  Filled 2023-01-21: qty 1, 31d supply, fill #0

## 2023-01-21 MED ORDER — "BD LUER-LOK SYRINGE 25G X 5/8"" 3 ML MISC"
1.0000 | 0 refills | Status: DC
  Filled 2023-01-21 – 2023-02-04 (×2): qty 2, 28d supply, fill #0
  Filled 2023-02-15: qty 6, 84d supply, fill #0

## 2023-01-21 MED ORDER — DEXCOM G6 SENSOR MISC
1.0000 | Freq: Once | 2 refills | Status: AC
Start: 2023-01-21 — End: 2023-01-21
  Filled 2023-01-21 – 2023-02-04 (×2): qty 3, 30d supply, fill #0

## 2023-01-21 NOTE — Progress Notes (Signed)
.smr  Office: [phone]  /  Fax: [phone]  WEIGHT SUMMARY AND BIOMETRICS  Vitals Temp: 98.3 F (36.8 C) BP: 95/62 Pulse Rate: (!) 58 SpO2: 99 %   Anthropometric Measurements Height: 5\' 8"  (1.727 m) Weight: 178 lb (80.7 kg) BMI (Calculated): 27.07 Weight at Last Visit: 176 lb Weight Lost Since Last Visit: 0 Weight Gained Since Last Visit: 2 lb Starting Weight: 243 lb Total Weight Loss (lbs): 65 lb (29.5 kg)   Body Composition  Body Fat %: 34.2 % Fat Mass (lbs): 61 lbs Muscle Mass (lbs): 111.6 lbs Total Body Water (lbs): 81 lbs Visceral Fat Rating : 6   Other Clinical Data Fasting: no Labs: no Starting Date: 06/18/19     HPI  Chief Complaint: OBESITY  [name] [name] is here to discuss her progress with her obesity treatment plan. She is on the the Category 1 Plan and states she is following her eating plan approximately 0 % of the time. She states she is exercising cardio 30 minutes 2 times per week.  Discussed the use of AI scribe software for clinical note transcription with the patient, who gave verbal consent to proceed.  History of Present Illness           Interval History:  Since last office visit she is up 2 lbs. Bio impedence scale reviewed with the patient:  Up 4.2 lbs muscle mass Down 1.6 lbs adipose mass  Hunger/appetite-increased on decreased Mounjaro dose Cravings- increased Stress- manageable. Enjoying new job Exercise-less consistent recently, but exercise tolerance improved overall.  [name] [name], a 42 year old female with a history of obesity, gastric bypass surgery, type 2 diabetes, and long COVID/POTS, presents for a follow-up of her obesity treatment plan. She reports a significant increase in appetite and cravings for sweets, which she attributes to a reduction in her medication Greggory Keen). She describes feeling like she is starving all the time, which is causing her to eat excessively. She is not currently monitoring her blood sugars  but is open to using a Dexcom monitor again. [name] [name] also reports experiencing symptoms related to temperature changes, which she believes may be affecting her blood pressure and causing dizziness. Despite these challenges, she is managing to exercise for about 30 minutes, two days a week.  Pharmacotherapy: Mounjaro 5 mg weekly. Denies mass in neck, dysphagia, dyspepsia, persistent hoarseness, abdominal pain, or N/V or diarrhea. Has annual eye exam. Mood is stable. Taking Amitiza  for constipation.    TREATMENT PLAN FOR OBESITY:  Recommended Dietary Goals  [name] [name] is currently in the action stage of change. As such, her goal is to continue weight management plan. She has agreed to the Category 1 Plan.  Behavioral Intervention  We discussed the following Behavioral Modification Strategies today: increasing lean protein intake, decreasing simple carbohydrates , increasing vegetables, increasing lower glycemic fruits, increasing fiber rich foods, avoiding skipping meals, increasing water intake, work on meal planning and preparation, emotional eating strategies and understanding the difference between hunger signals and cravings, continue to practice mindfulness when eating, and planning for success.  Additional resources provided today: NA  Recommended Physical Activity Goals  [name] [name] has been advised to work up to 150 minutes of moderate intensity aerobic activity a week and strengthening exercises 2-3 times per week for cardiovascular health, weight loss maintenance and preservation of muscle mass.   She has agreed to Continue current level of physical activity    Pharmacotherapy We discussed various medication options to help Sonita with her weight loss efforts and we both agreed  to increase Mounjaro to 7.5 mg weekly for Type 2 diabetes.    Return in about 4 weeks (around 02/18/2023).Marland Kitchen She was informed of the importance of frequent follow up visits to maximize her success with  intensive lifestyle modifications for her multiple health conditions.  PHYSICAL EXAM:  Blood pressure 95/62, pulse (!) 58, temperature 98.3 F (36.8 C), height 5\' 8"  (1.727 m), weight 178 lb (80.7 kg), SpO2 99%. Body mass index is 27.06 kg/m.  General: She is overweight, cooperative, alert, well developed, and in no acute distress. PSYCH: Has normal mood, affect and thought process.   Cardiovascular: HR 50's BP 95/62 Lungs: Normal breathing effort, no conversational dyspnea. Neuro: no focal deficits  DIAGNOSTIC DATA REVIEWED:  BMET    Component Value Date/Time   NA 140 12/10/2022 0819   K 4.0 12/10/2022 0819   CL 103 12/10/2022 0819   CO2 25 12/10/2022 0819   GLUCOSE 88 12/10/2022 0819   GLUCOSE 82 07/23/2022 0845   BUN 14 12/10/2022 0819   CREATININE 0.93 12/10/2022 0819   CREATININE 0.77 03/14/2016 1409   CALCIUM 9.6 12/10/2022 0819   GFRNONAA >60 07/23/2022 0845   GFRAA >60 09/30/2019 0801   Lab Results  Component Value Date   HGBA1C 5.2 12/10/2022   HGBA1C  09/02/2008    5.8 (NOTE) The ADA recommends the following therapeutic goal for glycemic control related to Hgb A1c measurement: Goal of therapy: <6.5 Hgb A1c  Reference: American Diabetes Association: Clinical Practice Recommendations 2010, Diabetes Care, 2010, 33: (Suppl  1).   Lab Results  Component Value Date   INSULIN 8.2 12/10/2022   INSULIN 10.1 06/18/2019   Lab Results  Component Value Date   TSH 2.230 12/10/2022   CBC    Component Value Date/Time   WBC 8.1 12/10/2022 0819   WBC 5.5 07/23/2022 0845   RBC 4.86 12/10/2022 0819   RBC 4.51 07/23/2022 0845   HGB 14.0 12/10/2022 0819   HCT 42.8 12/10/2022 0819   PLT 261 12/10/2022 0819   MCV 88 12/10/2022 0819   MCH 28.8 12/10/2022 0819   MCH 29.9 07/23/2022 0845   MCHC 32.7 12/10/2022 0819   MCHC 35.5 07/23/2022 0845   RDW 12.1 12/10/2022 0819   Iron Studies    Component Value Date/Time   IRON 84 12/10/2022 0819   TIBC 288 12/10/2022  0819   FERRITIN 33 12/10/2022 0819   IRONPCTSAT 29 12/10/2022 0819   Lipid Panel     Component Value Date/Time   CHOL 175 12/10/2022 0819   TRIG 92 12/10/2022 0819   HDL 49 12/10/2022 0819   CHOLHDL 3.3 06/28/2021 0803   CHOLHDL 3.5 03/23/2018 0623   VLDL 8 03/23/2018 0623   LDLCALC 109 (H) 12/10/2022 0819   LDLDIRECT 141 (H) 09/23/2020 1130   Hepatic Function Panel     Component Value Date/Time   PROT 7.0 12/10/2022 0819   ALBUMIN 4.3 12/10/2022 0819   AST 15 12/10/2022 0819   ALT 10 12/10/2022 0819   ALKPHOS 65 12/10/2022 0819   BILITOT 0.3 12/10/2022 0819      Component Value Date/Time   TSH 2.230 12/10/2022 0819   Nutritional Lab Results  Component Value Date   VD25OH 66.1 12/10/2022   VD25OH 42.9 04/11/2022   VD25OH 58.7 12/06/2021    ASSOCIATED CONDITIONS ADDRESSED TODAY  ASSESSMENT AND PLAN  Problem List Items Addressed This Visit     Generalized obesity   Relevant Medications   tirzepatide (MOUNJARO) 7.5 MG/0.5ML Pen  Type 2 diabetes mellitus without complication, without long-term current use of insulin (HCC) - Primary   Relevant Medications   SYRINGE-NEEDLE, DISP, 3 ML (B-D 3CC LUER-LOK SYR 25GX5/8") 25G X 5/8" 3 ML MISC   tirzepatide (MOUNJARO) 7.5 MG/0.5ML Pen   Continuous Glucose Receiver (DEXCOM G6 RECEIVER) DEVI   Continuous Glucose Sensor (DEXCOM G6 SENSOR) MISC   Continuous Glucose Transmitter (DEXCOM G6 TRANSMITTER) MISC   Vitamin D deficiency   Relevant Medications   Vitamin D, Ergocalciferol, (DRISDOL) 1.25 MG (50000 UNIT) CAPS capsule   Syncope and collapse   B12 deficiency   Relevant Medications   cyanocobalamin (VITAMIN B12) 1000 MCG/ML injection   BMI 27.0-27.9,adult  Obesity Increased appetite and cravings, particularly for sweets. History of gastric bypass surgery. Small increase in weight but also increase in muscle mass and decrease in adipose tissue. Visceral adipose rating of 6, which is within the desired range. -Increase  Mounjaro dose. -Order Dexcom G6 receiver device and sensors for glucose monitoring. -Return to regular exercise routine, aiming for 30 minutes twice a week. -Follow-up appointment in one month.  Type 2 Diabetes Not currently monitoring blood sugars. History of using Dexcom for a 30-day trial period without significant hypoglycemia. -Order Dexcom G6 receiver device and sensors for glucose monitoring. -Check blood sugars regularly.   History of Syncope/Long COVID/POTS Reports symptoms of intolerance to temperature changes and associated dizziness. No specific plan discussed in the conversation.  General Health Maintenance -Plan for labs and IC test in November.   Type 2 Diabetes Mellitus with other specified complication, without long-term current use of insulin HgbA1c is at goal. Last A1c was 5.2 CBGs: Not checking  Medication(s): Mounjaro 5.0 mg SQ weekly Denies mass in neck, dysphagia, dyspepsia, persistent hoarseness, abdominal pain, or N/V/Constipation or diarrhea. Has annual eye exam. Mood is stable.  Takes medication for constipation management.  Lab Results  Component Value Date   HGBA1C 5.2 12/10/2022   HGBA1C 5.2 04/11/2022   HGBA1C 5.7 (H) 12/06/2021   Lab Results  Component Value Date   MICROALBUR 30 05/23/2022   LDLCALC 109 (H) 12/10/2022   CREATININE 0.93 12/10/2022   No results found for: "GFR"  Plan: Continue and increase dose to Mounjaro 7.5 mg SQ weekly as increased cravings and hunger over the past couple of months.  Ordered Dexcom CGM to allow better monitoring of levels.  Continue working on nutrition plan to decrease simple carbohydrates, increase lean proteins and exercise to promote weight loss and improve glycemic control . Plan to recheck labs in November.   Vitamin D Deficiency Vitamin D is at goal of 50.  Most recent vitamin D level was 66.1. She is on  prescription ergocalciferol 50,000 IU weekly. Lab Results  Component Value Date   VD25OH  66.1 12/10/2022   VD25OH 42.9 04/11/2022   VD25OH 58.7 12/06/2021    Plan: Continue and refill  prescription ergocalciferol 50,000 IU weekly Low vitamin D levels can be associated with adiposity and may result in leptin resistance and weight gain. Also associated with fatigue. Currently on vitamin D supplementation without any adverse effects.  Recheck vitamin D level in November.   B 12 deficiency; On monthly B 12 injections without side effects. Plan: Refill and continue B 12 injections and plan to recheck levels in November.     ATTESTASTION STATEMENTS:  Reviewed by clinician on day of visit: allergies, medications, problem list, medical history, surgical history, family history, social history, and previous encounter notes.   I have personally spent  43 minutes total time today in preparation, patient care, nutritional counseling and documentation for this visit, including the following: review of clinical lab tests; review of medical tests/procedures/services.      Ewell Benassi, PA-C

## 2023-01-31 ENCOUNTER — Telehealth (INDEPENDENT_AMBULATORY_CARE_PROVIDER_SITE_OTHER): Payer: Self-pay

## 2023-01-31 ENCOUNTER — Other Ambulatory Visit (HOSPITAL_COMMUNITY): Payer: Self-pay

## 2023-01-31 NOTE — Telephone Encounter (Signed)
Prior Berkley Harvey has been started for Dexcom, sensor, transmitter and receiver, 3 different PA's.  Call to patient to see if she has tried and failed any other blood glucose monitoring kits.

## 2023-01-31 NOTE — Progress Notes (Signed)
[facility] Summary Report / Loop Recorder 

## 2023-01-31 NOTE — Telephone Encounter (Signed)
Prior auth questions have been submitted to insurance via cover my meds.  Awaiting response.

## 2023-02-01 ENCOUNTER — Other Ambulatory Visit (HOSPITAL_COMMUNITY): Payer: Self-pay

## 2023-02-04 ENCOUNTER — Other Ambulatory Visit (HOSPITAL_COMMUNITY): Payer: Self-pay

## 2023-02-04 ENCOUNTER — Other Ambulatory Visit: Payer: Self-pay

## 2023-02-04 ENCOUNTER — Telehealth (INDEPENDENT_AMBULATORY_CARE_PROVIDER_SITE_OTHER): Payer: Self-pay

## 2023-02-04 NOTE — Telephone Encounter (Signed)
Call from [place] at [employer].  Prescription for the Dexcom should read.   Transmitter is every 30 days. Sensor is every 10 days.

## 2023-02-04 NOTE — Telephone Encounter (Signed)
Message from [facility]:  01/31/2023  Shawn Rayburn [address] [place], [place] [address]  Plan Member ID: [id number] Case Number: [id number] Prescriber Name: Lazaro Arms Prescriber Fax: [phone]  NOTICE OF CANCELLATION DUE TO NO CLINICAL REVIEW REQUIRED Dear Mardella Layman Vences:  On behalf of [employer] [facility] is responsible for reviewing pharmacy services provided to [employer] members. On 01/31/2023, we received a request from your prescriber for coverage of Dexcom G6 Mis Transmit.  This request has been cancelled. Why was my request cancelled?  This medication currently does not require clinical review, and you may pick this up from your pharmacy.  This medication or product is on your plan's list of covered drugs. Prior authorization is not required at this time. If your pharmacy has questions regarding the processing of your prescription, please have them call the [facility] pharmacy help desk at [phone].  To find out more information about this request and why it was cancelled, please call [facility] toll-free at [phone].  Sincerely,  [facility] cc: Shawn Rayburn

## 2023-02-14 ENCOUNTER — Other Ambulatory Visit (HOSPITAL_COMMUNITY): Payer: Self-pay

## 2023-02-15 ENCOUNTER — Other Ambulatory Visit (HOSPITAL_COMMUNITY): Payer: Self-pay

## 2023-02-18 ENCOUNTER — Ambulatory Visit (INDEPENDENT_AMBULATORY_CARE_PROVIDER_SITE_OTHER): Payer: No Typology Code available for payment source

## 2023-02-18 ENCOUNTER — Encounter (INDEPENDENT_AMBULATORY_CARE_PROVIDER_SITE_OTHER): Payer: Self-pay

## 2023-02-18 ENCOUNTER — Ambulatory Visit (INDEPENDENT_AMBULATORY_CARE_PROVIDER_SITE_OTHER): Payer: No Typology Code available for payment source | Admitting: Physician Assistant

## 2023-02-18 DIAGNOSIS — R55 Syncope and collapse: Secondary | ICD-10-CM | POA: Diagnosis not present

## 2023-02-19 LAB — CUP PACEART REMOTE DEVICE CHECK
Date Time Interrogation Session: 20241013231304
Implantable Pulse Generator Implant Date: 20220216

## 2023-03-06 NOTE — Progress Notes (Signed)
[facility] Summary Report / Loop Recorder 

## 2023-03-13 ENCOUNTER — Other Ambulatory Visit (INDEPENDENT_AMBULATORY_CARE_PROVIDER_SITE_OTHER): Payer: Self-pay | Admitting: Physician Assistant

## 2023-03-13 ENCOUNTER — Other Ambulatory Visit (HOSPITAL_COMMUNITY): Payer: Self-pay

## 2023-03-13 DIAGNOSIS — E119 Type 2 diabetes mellitus without complications: Secondary | ICD-10-CM

## 2023-03-13 MED ORDER — TIRZEPATIDE 7.5 MG/0.5ML ~~LOC~~ SOAJ
7.5000 mg | SUBCUTANEOUS | 0 refills | Status: DC
  Filled 2023-03-13: qty 2, 28d supply, fill #0

## 2023-03-13 NOTE — Telephone Encounter (Signed)
Pt needs a refill of Mounjaro. Patient is completely out. Please contact the pt today.

## 2023-03-14 ENCOUNTER — Other Ambulatory Visit: Payer: Self-pay

## 2023-03-15 ENCOUNTER — Other Ambulatory Visit (HOSPITAL_COMMUNITY): Payer: Self-pay

## 2023-03-25 ENCOUNTER — Ambulatory Visit (INDEPENDENT_AMBULATORY_CARE_PROVIDER_SITE_OTHER): Payer: No Typology Code available for payment source

## 2023-03-25 DIAGNOSIS — R55 Syncope and collapse: Secondary | ICD-10-CM | POA: Diagnosis not present

## 2023-03-25 LAB — CUP PACEART REMOTE DEVICE CHECK
Date Time Interrogation Session: 20241117231003
Implantable Pulse Generator Implant Date: 20220216

## 2023-03-31 NOTE — Progress Notes (Unsigned)
SUBJECTIVE:  Chief Complaint: Obesity  Interim History: Since last visit she is down 1 lb. Bio impedence scale reviewed with the patient:   Muscle mass down 3 lbs. Adipose mass up 2 lbs.  Total body water up 0.4 lbs.  Reports overall hunger and appetite  moderate control No excessive cravings.  Stress level overall improved but some upcoming stress with family over the holidays anticipated.  Exercise- Has started using Pedaler and thinking about getting standing desk to increase NEAT.    Working out 30 minutes 3 times weekly, but feels is more sedentary with work at [employer].  Hydration- Adequate.  Protein intake is usually adequate.   Still some symptoms with Long Covid/[place] and had another active Covid infection over the past couple of months.   [name] [name] is here to discuss her progress with her obesity treatment plan. She is on the Category 1 Plan and states she is following her eating plan approximately 85 % of the time. She states she is exercising 30 minutes 3  times per week.  Repeated IC today and REE is 1267 which is 144 calories less than IC last year and lower than predicted IC of 1548 kcal/day.  We discussed than continuing to exercise may help, but she may actually need to increase her calories over the next month by 100-200 calories daily and discussed focusing on making sure she is getting adequate protein/meeting protein goals over the next month.   Pharmacotherapy: Mounjaro 7.5 mg weekly. Denies mass in neck, dysphagia, dyspepsia, persistent hoarseness, abdominal pain, or vomiting or diarrhea. Has annual eye exam. Mood is stable.  Has chronic constipation issues which have improved on Amitiza 24 mcg- taking twice a week currently with good result. Some nausea and has been on Zofran prn and needs refill today.  Bariatric surgery: S/P gastric sleeve 2018- Dr. Wenda [name].   OBJECTIVE: Visit Diagnoses: Problem List Items Addressed This Visit     Generalized obesity    Relevant Medications   tirzepatide (MOUNJARO) 7.5 MG/0.5ML Pen   Type 2 diabetes mellitus without complication, without long-term current use of insulin (HCC)   Relevant Medications   tirzepatide (MOUNJARO) 7.5 MG/0.5ML Pen   Nausea   Relevant Medications   ondansetron (ZOFRAN-ODT) 4 MG disintegrating tablet   Vitamin D deficiency   Relevant Medications   Vitamin D, Ergocalciferol, (DRISDOL) 1.25 MG (50000 UNIT) CAPS capsule   B12 deficiency   Relevant Medications   cyanocobalamin (VITAMIN B12) 1000 MCG/ML injection   BMI 27.0-27.9,adult   SOBOE (shortness of breath on exertion) - Primary    SOBOE: Repeat IC done today to evaluate REE and is 1267, 144 calories down.  Discussed importance of NEAT in daily metabolic needs and discussed that her body as adapted to a reduced calorie intake by lowering her metabolic rate. We discussed that she may actually need to increase her calories by 100-150 calories daily, focusing on primarily high protein and higher fiber foods to promote satiety and maintain muscle mass.      Type 2 Diabetes Mellitus with other specified complication, without long-term current use of insulin HgbA1c is at goal. Last A1c was 5.2 On Statin.  Long covid limits tolerance to ACE/ARB  Medication(s): Mounjaro 7.5 mg SQ weekly   Denies mass in neck, dysphagia, dyspepsia, persistent hoarseness, abdominal pain, or V/Constipation or diarrhea. Has annual eye exam. Mood is stable.    Some nausea on day of injection still, but no other side effects.   Lab Results  Component  Value Date   HGBA1C 5.2 12/10/2022   HGBA1C 5.2 04/11/2022   HGBA1C 5.7 (H) 12/06/2021   Lab Results  Component Value Date   MICROALBUR 30 05/23/2022   LDLCALC 109 (H) 12/10/2022   CREATININE 0.93 12/10/2022   No results found for: "GFR"  Plan: Continue and refill Mounjaro 7.5 mg SQ weekly x 90 days Zofran prn refilled for nausea which is isolated to day of injection.  Continue working  on  nutrition plan to decrease simple carbohydrates, increase lean proteins and exercise to promote weight loss and improve glycemic control .   Vitamin D Deficiency Vitamin D is at goal of 50.  Most recent vitamin D level was 66.1. She is on  prescription ergocalciferol 50,000 IU weekly. Lab Results  Component Value Date   VD25OH 66.1 12/10/2022   VD25OH 42.9 04/11/2022   VD25OH 58.7 12/06/2021    Plan: Continue and refill  prescription ergocalciferol 50,000 IU weekly x 90 days [name] vitamin D levels can be associated with adiposity and may result in leptin resistance and weight gain. Also associated with fatigue. Currently on vitamin D supplementation without any adverse effects.  Recheck vitamin D level several times yearly to optimize supplementation/avoid over supplementation.   B 12 deficiency: On B 12 injection 1000 mcg every 14 days. No side effects.  Reports energy level improved overall, but still having difficulty with Long Covid symptoms intermittently.  Plan: Continue/Refill B12 injections x 90 days.  Recheck B 12 levels early next year.    Vitals Temp: 97.7 F (36.5 C) BP: 94/62 Pulse Rate: 78 SpO2: 97 %   Anthropometric Measurements Height: 5\' 8"  (1.727 m) Weight: 177 lb (80.3 kg) BMI (Calculated): 26.92 Weight at Last Visit: 178 lb Weight Lost Since Last Visit: 1 Weight Gained Since Last Visit: 0 Starting Weight: 243 lb Total Weight Loss (lbs): 66 lb (29.9 kg)   Body Composition  Body Fat %: 35.5 % Fat Mass (lbs): 63 lbs Muscle Mass (lbs): 108.6 lbs Total Body Water (lbs): 81.4 lbs Visceral Fat Rating : 6   Other Clinical Data RMR: 1267 Fasting: yes Labs: yes Today's Visit #: 34 Starting Date: 06/18/19     ASSESSMENT AND PLAN:  Diet: [name] [name] is currently in the action stage of change. As such, her goal is to continue with weight loss efforts. She has agreed to Category 1 Plan.  Exercise: [name] [name] has been instructed to continue exercising  as is for weight loss and overall health benefits.   Behavior Modification:  We discussed the following Behavioral Modification Strategies today: increasing lean protein intake, decreasing simple carbohydrates, increasing vegetables, increase H2O intake, increase high fiber foods, no skipping meals, emotional eating strategies , and holiday eating strategies. We discussed various medication options to help Talli with her weight loss efforts and we both agreed to continue Mounjaro 7.5 mg weekly for Type 2 diabetes management.  Return in about 5 weeks (around 05/06/2023).Marland Kitchen She was informed of the importance of frequent follow up visits to maximize her success with intensive lifestyle modifications for her multiple health conditions.  Attestation Statements:   Reviewed by clinician on day of visit: allergies, medications, problem list, medical history, surgical history, family history, social history, and previous encounter notes.   Time spent on visit including pre-visit chart review and post-visit care and charting was 46 minutes.    [name] Altice, PA-C

## 2023-04-01 ENCOUNTER — Encounter (INDEPENDENT_AMBULATORY_CARE_PROVIDER_SITE_OTHER): Payer: Self-pay | Admitting: Physician Assistant

## 2023-04-01 ENCOUNTER — Ambulatory Visit (INDEPENDENT_AMBULATORY_CARE_PROVIDER_SITE_OTHER): Payer: No Typology Code available for payment source | Admitting: Physician Assistant

## 2023-04-01 VITALS — BP 94/62 | HR 78 | Temp 97.7°F | Ht 68.0 in | Wt 177.0 lb

## 2023-04-01 DIAGNOSIS — E538 Deficiency of other specified B group vitamins: Secondary | ICD-10-CM | POA: Diagnosis not present

## 2023-04-01 DIAGNOSIS — E669 Obesity, unspecified: Secondary | ICD-10-CM

## 2023-04-01 DIAGNOSIS — Z9884 Bariatric surgery status: Secondary | ICD-10-CM

## 2023-04-01 DIAGNOSIS — E119 Type 2 diabetes mellitus without complications: Secondary | ICD-10-CM

## 2023-04-01 DIAGNOSIS — R110 Nausea: Secondary | ICD-10-CM

## 2023-04-01 DIAGNOSIS — E559 Vitamin D deficiency, unspecified: Secondary | ICD-10-CM | POA: Diagnosis not present

## 2023-04-01 DIAGNOSIS — R0602 Shortness of breath: Secondary | ICD-10-CM | POA: Diagnosis not present

## 2023-04-01 DIAGNOSIS — Z6827 Body mass index (BMI) 27.0-27.9, adult: Secondary | ICD-10-CM

## 2023-04-01 DIAGNOSIS — Z7985 Long-term (current) use of injectable non-insulin antidiabetic drugs: Secondary | ICD-10-CM

## 2023-04-01 MED ORDER — TIRZEPATIDE 7.5 MG/0.5ML ~~LOC~~ SOAJ: 7.5000 mg | SUBCUTANEOUS | 0 refills | Status: DC

## 2023-04-01 MED ORDER — ONDANSETRON 4 MG PO TBDP: 4.0000 mg | ORAL_TABLET | ORAL | 1 refills | Status: DC | PRN

## 2023-04-01 MED ORDER — VITAMIN D (ERGOCALCIFEROL) 1.25 MG (50000 UNIT) PO CAPS: 50000.0000 [IU] | ORAL_CAPSULE | ORAL | 0 refills | Status: DC

## 2023-04-01 MED ORDER — CYANOCOBALAMIN 1000 MCG/ML IJ SOLN: 1000.0000 ug | INTRAMUSCULAR | 0 refills | Status: DC

## 2023-04-02 ENCOUNTER — Other Ambulatory Visit (HOSPITAL_COMMUNITY): Payer: Self-pay

## 2023-04-02 ENCOUNTER — Telehealth (INDEPENDENT_AMBULATORY_CARE_PROVIDER_SITE_OTHER): Payer: Self-pay | Admitting: Physician Assistant

## 2023-04-02 DIAGNOSIS — E119 Type 2 diabetes mellitus without complications: Secondary | ICD-10-CM

## 2023-04-02 MED ORDER — TIRZEPATIDE 7.5 MG/0.5ML ~~LOC~~ SOAJ
7.5000 mg | SUBCUTANEOUS | 0 refills | Status: DC
  Filled 2023-04-02 – 2023-04-03 (×2): qty 2, 28d supply, fill #0
  Filled 2023-04-12: qty 6, 84d supply, fill #0

## 2023-04-02 NOTE — Telephone Encounter (Signed)
Needs to go to [place], [place] delivery is out.

## 2023-04-02 NOTE — Telephone Encounter (Signed)
Pt is calling in to see if she can get her Rx Mounjaro 7.5 MG that was sent to [facility] Delivery they are out and she would like for it to go to Pharm: [facility] outpt.  Pt stated that she is out of the medication and would like to see if it can be called in today.

## 2023-04-03 ENCOUNTER — Other Ambulatory Visit (HOSPITAL_COMMUNITY): Payer: Self-pay

## 2023-04-12 ENCOUNTER — Other Ambulatory Visit (HOSPITAL_COMMUNITY): Payer: Self-pay

## 2023-04-19 NOTE — Progress Notes (Signed)
[facility] Summary Report / Loop Recorder 

## 2023-04-21 ENCOUNTER — Encounter (HOSPITAL_BASED_OUTPATIENT_CLINIC_OR_DEPARTMENT_OTHER): Payer: Self-pay | Admitting: Emergency Medicine

## 2023-04-21 ENCOUNTER — Other Ambulatory Visit: Payer: Self-pay

## 2023-04-21 ENCOUNTER — Emergency Department (HOSPITAL_BASED_OUTPATIENT_CLINIC_OR_DEPARTMENT_OTHER): Payer: No Typology Code available for payment source

## 2023-04-21 ENCOUNTER — Inpatient Hospital Stay (HOSPITAL_BASED_OUTPATIENT_CLINIC_OR_DEPARTMENT_OTHER)
Admission: EM | Admit: 2023-04-21 | Discharge: 2023-04-25 | DRG: 440 | Disposition: A | Discharge status: Home / Self Care | Payer: No Typology Code available for payment source | Attending: Internal Medicine | Admitting: Internal Medicine

## 2023-04-21 ENCOUNTER — Emergency Department (HOSPITAL_BASED_OUTPATIENT_CLINIC_OR_DEPARTMENT_OTHER)
Admission: EM | Admit: 2023-04-21 | Discharge: 2023-04-21 | Disposition: A | Discharge status: Home / Self Care | Payer: No Typology Code available for payment source | Attending: Emergency Medicine | Admitting: Emergency Medicine

## 2023-04-21 ENCOUNTER — Encounter (HOSPITAL_BASED_OUTPATIENT_CLINIC_OR_DEPARTMENT_OTHER): Payer: Self-pay | Admitting: Urology

## 2023-04-21 DIAGNOSIS — K859 Acute pancreatitis without necrosis or infection, unspecified: Principal | ICD-10-CM

## 2023-04-21 DIAGNOSIS — Z7982 Long term (current) use of aspirin: Secondary | ICD-10-CM | POA: Insufficient documentation

## 2023-04-21 DIAGNOSIS — Z823 Family history of stroke: Secondary | ICD-10-CM

## 2023-04-21 DIAGNOSIS — Z803 Family history of malignant neoplasm of breast: Secondary | ICD-10-CM

## 2023-04-21 DIAGNOSIS — Z91041 Radiographic dye allergy status: Secondary | ICD-10-CM

## 2023-04-21 DIAGNOSIS — Z794 Long term (current) use of insulin: Secondary | ICD-10-CM | POA: Insufficient documentation

## 2023-04-21 DIAGNOSIS — Z8616 Personal history of COVID-19: Secondary | ICD-10-CM

## 2023-04-21 DIAGNOSIS — Z8249 Family history of ischemic heart disease and other diseases of the circulatory system: Secondary | ICD-10-CM

## 2023-04-21 DIAGNOSIS — I9589 Other hypotension: Secondary | ICD-10-CM | POA: Diagnosis present

## 2023-04-21 DIAGNOSIS — K8500 Idiopathic acute pancreatitis without necrosis or infection: Secondary | ICD-10-CM | POA: Insufficient documentation

## 2023-04-21 DIAGNOSIS — Z800 Family history of malignant neoplasm of digestive organs: Secondary | ICD-10-CM

## 2023-04-21 DIAGNOSIS — Z7985 Long-term (current) use of injectable non-insulin antidiabetic drugs: Secondary | ICD-10-CM

## 2023-04-21 DIAGNOSIS — Z833 Family history of diabetes mellitus: Secondary | ICD-10-CM

## 2023-04-21 DIAGNOSIS — K589 Irritable bowel syndrome without diarrhea: Secondary | ICD-10-CM | POA: Diagnosis present

## 2023-04-21 DIAGNOSIS — E282 Polycystic ovarian syndrome: Secondary | ICD-10-CM | POA: Diagnosis present

## 2023-04-21 DIAGNOSIS — R1010 Upper abdominal pain, unspecified: Secondary | ICD-10-CM | POA: Diagnosis not present

## 2023-04-21 DIAGNOSIS — E663 Overweight: Secondary | ICD-10-CM | POA: Diagnosis present

## 2023-04-21 DIAGNOSIS — Z888 Allergy status to other drugs, medicaments and biological substances status: Secondary | ICD-10-CM

## 2023-04-21 DIAGNOSIS — Z9049 Acquired absence of other specified parts of digestive tract: Secondary | ICD-10-CM

## 2023-04-21 DIAGNOSIS — Z79899 Other long term (current) drug therapy: Secondary | ICD-10-CM

## 2023-04-21 DIAGNOSIS — Z9884 Bariatric surgery status: Secondary | ICD-10-CM

## 2023-04-21 DIAGNOSIS — I10 Essential (primary) hypertension: Secondary | ICD-10-CM | POA: Diagnosis present

## 2023-04-21 DIAGNOSIS — E119 Type 2 diabetes mellitus without complications: Secondary | ICD-10-CM

## 2023-04-21 DIAGNOSIS — Z8349 Family history of other endocrine, nutritional and metabolic diseases: Secondary | ICD-10-CM

## 2023-04-21 DIAGNOSIS — Z820 Family history of epilepsy and other diseases of the nervous system: Secondary | ICD-10-CM

## 2023-04-21 DIAGNOSIS — E785 Hyperlipidemia, unspecified: Secondary | ICD-10-CM | POA: Diagnosis present

## 2023-04-21 DIAGNOSIS — Z818 Family history of other mental and behavioral disorders: Secondary | ICD-10-CM

## 2023-04-21 DIAGNOSIS — F419 Anxiety disorder, unspecified: Secondary | ICD-10-CM | POA: Diagnosis present

## 2023-04-21 DIAGNOSIS — Z87891 Personal history of nicotine dependence: Secondary | ICD-10-CM

## 2023-04-21 LAB — CBC WITH DIFFERENTIAL/PLATELET
Abs Immature Granulocytes: 0.02 10*3/uL (ref 0.00–0.07)
Basophils Absolute: 0.1 10*3/uL (ref 0.0–0.1)
Basophils Relative: 1 %
Eosinophils Absolute: 0.3 10*3/uL (ref 0.0–0.5)
Eosinophils Relative: 3 %
HCT: 34.8 % — ABNORMAL LOW (ref 36.0–46.0)
Hemoglobin: 12.2 g/dL (ref 12.0–15.0)
Immature Granulocytes: 0 %
Lymphocytes Relative: 28 %
Lymphs Abs: 2.9 10*3/uL (ref 0.7–4.0)
MCH: 29.6 pg (ref 26.0–34.0)
MCHC: 35.1 g/dL (ref 30.0–36.0)
MCV: 84.5 fL (ref 80.0–100.0)
Monocytes Absolute: 0.7 10*3/uL (ref 0.1–1.0)
Monocytes Relative: 7 %
Neutro Abs: 6.4 10*3/uL (ref 1.7–7.7)
Neutrophils Relative %: 61 %
Platelets: 232 10*3/uL (ref 150–400)
RBC: 4.12 MIL/uL (ref 3.87–5.11)
RDW: 12.2 % (ref 11.5–15.5)
WBC: 10.4 10*3/uL (ref 4.0–10.5)
nRBC: 0 % (ref 0.0–0.2)

## 2023-04-21 LAB — COMPREHENSIVE METABOLIC PANEL
ALT: 14 U/L (ref 0–44)
AST: 24 U/L (ref 15–41)
Albumin: 3.5 g/dL (ref 3.5–5.0)
Alkaline Phosphatase: 45 U/L (ref 38–126)
Anion gap: 6 (ref 5–15)
BUN: 13 mg/dL (ref 6–20)
CO2: 23 mmol/L (ref 22–32)
Calcium: 8.6 mg/dL — ABNORMAL LOW (ref 8.9–10.3)
Chloride: 105 mmol/L (ref 98–111)
Creatinine, Ser: 0.93 mg/dL (ref 0.44–1.00)
GFR, Estimated: >60 mL/min (ref >60)
Glucose, Bld: 84 mg/dL (ref 70–99)
Potassium: 4 mmol/L (ref 3.5–5.1)
Sodium: 134 mmol/L — ABNORMAL LOW (ref 135–145)
Total Bilirubin: 0.8 mg/dL (ref <1.2)
Total Protein: 6.7 g/dL (ref 6.5–8.1)

## 2023-04-21 LAB — CBC
HCT: 38.2 % (ref 36.0–46.0)
Hemoglobin: 13.3 g/dL (ref 12.0–15.0)
MCH: 29.6 pg (ref 26.0–34.0)
MCHC: 34.8 g/dL (ref 30.0–36.0)
MCV: 84.9 fL (ref 80.0–100.0)
Platelets: 232 10*3/uL (ref 150–400)
RBC: 4.5 MIL/uL (ref 3.87–5.11)
RDW: 12.3 % (ref 11.5–15.5)
WBC: 12.3 10*3/uL — ABNORMAL HIGH (ref 4.0–10.5)
nRBC: 0 % (ref 0.0–0.2)

## 2023-04-21 LAB — LIPASE, BLOOD: Lipase: 40 U/L (ref 11–51)

## 2023-04-21 MED ORDER — SODIUM CHLORIDE 0.9 % IV BOLUS: 1000.0000 mL | Freq: Once | INTRAVENOUS | Status: DC

## 2023-04-21 MED ORDER — ONDANSETRON HCL 4 MG/2ML IJ SOLN
4.0000 mg | Freq: Once | INTRAMUSCULAR | Status: AC
  Administered 2023-04-21: 4 mg via INTRAVENOUS
  Filled 2023-04-21: qty 2

## 2023-04-21 MED ORDER — ONDANSETRON 4 MG PO TBDP: ORAL_TABLET | ORAL | 0 refills | Status: DC

## 2023-04-21 MED ORDER — HYDROMORPHONE HCL 1 MG/ML IJ SOLN
1.0000 mg | Freq: Once | INTRAMUSCULAR | Status: AC
  Administered 2023-04-21: 1 mg via INTRAVENOUS
  Filled 2023-04-21: qty 1

## 2023-04-21 MED ORDER — MORPHINE SULFATE 15 MG PO TABS: 7.5000 mg | ORAL_TABLET | ORAL | 0 refills | Status: DC | PRN

## 2023-04-21 MED ORDER — ONDANSETRON 4 MG PO TBDP
4.0000 mg | ORAL_TABLET | Freq: Once | ORAL | Status: AC
  Administered 2023-04-21: 4 mg via ORAL
  Filled 2023-04-21: qty 1

## 2023-04-21 MED ORDER — PROMETHAZINE HCL 25 MG/ML IJ SOLN
INTRAMUSCULAR | Status: AC
  Filled 2023-04-21: qty 1

## 2023-04-21 MED ORDER — SODIUM CHLORIDE 0.9 % IV BOLUS
1000.0000 mL | Freq: Once | INTRAVENOUS | Status: AC
  Administered 2023-04-21: 1000 mL via INTRAVENOUS

## 2023-04-21 MED ORDER — HYDROMORPHONE HCL 1 MG/ML IJ SOLN
0.5000 mg | Freq: Once | INTRAMUSCULAR | Status: AC
  Administered 2023-04-21: 0.5 mg via INTRAVENOUS
  Filled 2023-04-21: qty 1

## 2023-04-21 MED ORDER — LACTATED RINGERS IV BOLUS
1000.0000 mL | Freq: Once | INTRAVENOUS | Status: AC
  Administered 2023-04-21: 1000 mL via INTRAVENOUS

## 2023-04-21 MED ORDER — SODIUM CHLORIDE 0.9 % IV SOLN
25.0000 mg | Freq: Four times a day (QID) | INTRAVENOUS | Status: DC | PRN
  Administered 2023-04-21 – 2023-04-24 (×7): 25 mg via INTRAVENOUS
  Filled 2023-04-21 (×10): qty 1

## 2023-04-21 NOTE — ED Provider Notes (Signed)
[place] [facility] Provider Note   CSN: [id number] Arrival date & time: 04/21/23  0047     History  Chief Complaint  Patient presents with   Abdominal Pain    [name] [name] is a 42 y.o. female.  43 yo F with a chief complaints of upper abdominal pain that radiates to the back.  This has been going on for a few days now.  Has had some nausea.  Denies vomiting denies fevers.  Decreased oral intake.  Felt like the pain is gotten progressively worse and now its unbearable.  Had a history of idiopathic pancreatitis.  Prior cholecystectomy.  Denies any alcohol intake.  She thinks this feels similar to her prior episode of pancreatitis.   Abdominal Pain      Home Medications Prior to Admission medications   Medication Sig Start Date End Date Taking? Authorizing Provider  morphine (MSIR) 15 MG tablet Take 0.5 tablets (7.5 mg total) by mouth every 4 (four) hours as needed. 04/21/23  Yes Melene Plan, DO  ondansetron (ZOFRAN-ODT) 4 MG disintegrating tablet 4mg  ODT q4 hours prn nausea/vomit 04/21/23  Yes Melene Plan, DO  aspirin EC 81 MG tablet Take 81 mg by mouth every other day. Swallow whole.    [provider]  Biotin w/ Vitamins C & E (HAIR/SKIN/NAILS PO) Take 1 tablet by mouth daily.     [provider]  clonazePAM (KLONOPIN) 0.5 MG tablet Take 1 tablet by mouth 2  times daily as needed for anxiety. 05/23/22   Carlean Jews, NP  cyanocobalamin (VITAMIN B12) 1000 MCG/ML injection Inject 1 mL (1,000 mcg total) into the skin every 14 (fourteen) days. 04/01/23   Rayburn, Fanny Bien, PA-C  levonorgestrel (MIRENA) 20 MCG/24HR IUD 1 each by Intrauterine route once.    [provider]  lubiprostone (AMITIZA) 24 MCG capsule Take 1 capsule (24 mcg total) by mouth 2 (two) times daily with a meal. 08/29/22   Carlean Jews, NP  Multiple Vitamins-Minerals (BARIATRIC MULTIVITAMINS/IRON PO) Take 1 tablet by mouth in the  morning and at bedtime.    [provider]  Probiotic Product (PROBIOTIC ADVANCED PO) Take 1 capsule by mouth daily.     [provider]  rosuvastatin (CRESTOR) 5 MG tablet Take 1 tablet (5 mg total) by mouth daily. 06/13/22   Carlean Jews, NP  SUMAtriptan (IMITREX) 50 MG tablet Take 1 tablet by mouth  at onset of headache. May repeat in 2 hours if symptoms are persistent 07/05/22   Boscia, Herbert Seta E, NP  SYRINGE-NEEDLE, DISP, 3 ML (B-D 3CC LUER-LOK SYR 25GX5/8") 25G X 5/8" 3 ML MISC Use to inject b12 every other week. 01/21/23   Rayburn, Fanny Bien, PA-C  tirzepatide [facility]) 7.5 MG/0.5ML Pen Inject 7.5 mg into the skin once a week. 04/02/23   Rayburn, Fanny Bien, PA-C  Vitamin D, Ergocalciferol, (DRISDOL) 1.25 MG (50000 UNIT) CAPS capsule Take 1 capsule (50,000 Units total) by mouth every 7 (seven) days. 04/01/23   Rayburn, Fanny Bien, PA-C      Allergies    Iohexol and Pyridium [phenazopyridine hcl]    Review of Systems   Review of Systems  Gastrointestinal:  Positive for abdominal pain.    Physical Exam Updated Vital Signs BP 97/61   Pulse 75   Temp 98.2 F (36.8 C) (Oral)   Resp 15   Ht 5\' 8"  (1.727 m)   Wt 80.2 kg   SpO2 99%   BMI  26.88 kg/m  Physical Exam Vitals and nursing note reviewed.  Constitutional:      General: She is not in acute distress.    Appearance: She is well-developed. She is not diaphoretic.  HENT:     Head: Normocephalic and atraumatic.  Eyes:     Pupils: Pupils are equal, round, and reactive to light.  Cardiovascular:     Rate and Rhythm: Normal rate and regular rhythm.     Heart sounds: No murmur heard.    No friction rub. No gallop.  Pulmonary:     Effort: Pulmonary effort is normal.     Breath sounds: No wheezing or rales.  Abdominal:     General: There is no distension.     Palpations: Abdomen is soft.     Tenderness: There is abdominal tenderness.     Comments: Diffuse abdominal pain.  Quite  exquisitely tender with some guarding to the upper abdomen.  Musculoskeletal:        General: No tenderness.     Cervical back: Normal range of motion and neck supple.  Skin:    General: Skin is warm and dry.  Neurological:     Mental Status: She is alert and oriented to person, place, and time.  Psychiatric:        Behavior: Behavior normal.     ED Results / Procedures / Treatments   Labs (all labs ordered are listed, but only abnormal results are displayed) Labs Reviewed  CBC WITH DIFFERENTIAL/PLATELET - Abnormal; Notable for the following components:      Result Value   HCT 34.8 (*)    All other components within normal limits  COMPREHENSIVE METABOLIC PANEL - Abnormal; Notable for the following components:   Sodium 134 (*)    Calcium 8.6 (*)    All other components within normal limits  LIPASE, BLOOD    EKG None  Radiology CT ABDOMEN PELVIS WO CONTRAST Result Date: 04/21/2023 CLINICAL DATA:  Acute nonlocalized abdominal pain EXAM: CT ABDOMEN AND PELVIS WITHOUT CONTRAST TECHNIQUE: Multidetector CT imaging of the abdomen and pelvis was performed following the standard protocol without IV contrast. RADIATION DOSE REDUCTION: This exam was performed according to the departmental dose-optimization program which includes automated exposure control, adjustment of the mA and/or kV according to patient size and/or use of iterative reconstruction technique. COMPARISON:  09/28/2017 FINDINGS: Lower chest: No acute abnormality. Hepatobiliary: No focal liver abnormality is seen. Status post cholecystectomy. No biliary dilatation. Pancreas: There is subtle peripancreatic inflammatory stranding involving the head and uncinate process of the pancreas in keeping with changes of acute pancreatitis. No loculated peripancreatic fluid collections. Pancreatic duct is not dilated. No parenchymal calcifications are seen. Parenchymal viability is not well assessed on this noncontrast examination. Spleen:  Normal in size without focal abnormality. Adrenals/Urinary Tract: Adrenal glands are unremarkable. Kidneys are normal, without renal calculi, focal lesion, or hydronephrosis. Bladder is unremarkable. Stomach/Bowel: Surgical changes of gastric sleeve resection are identified. The stomach, small bowel, and large bowel are otherwise unremarkable. Appendix normal. No free intraperitoneal gas or fluid. Vascular/Lymphatic: No significant vascular findings are present. No enlarged abdominal or pelvic lymph nodes. Reproductive: Intrauterine device in expected position within the endometrial cavity. The pelvic organs are otherwise unremarkable. Other: No abdominal wall hernia or abnormality. No abdominopelvic ascites. Musculoskeletal: No acute bone abnormality. No lytic or blastic bone lesion. IMPRESSION: 1. Subtle peripancreatic inflammatory stranding involving the head and uncinate process of the pancreas in keeping with changes of acute pancreatitis. No loculated peripancreatic fluid  collections. Parenchymal viability is not well assessed on this noncontrast examination. Findings appear similar to that noted on prior examination 2. Surgical changes of gastric sleeve resection and cholecystectomy. Electronically Signed   By: Helyn Numbers M.D.   On: 04/21/2023 02:04    Procedures Procedures    Medications Ordered in ED Medications  HYDROmorphone (DILAUDID) injection 1 mg (1 mg Intravenous Given 04/21/23 0122)  ondansetron (ZOFRAN) injection 4 mg (4 mg Intravenous Given 04/21/23 0122)  lactated ringers bolus 1,000 mL (0 mLs Intravenous Stopped 04/21/23 8657)    ED Course/ Medical Decision Making/ A&P                                 Medical Decision Making Amount and/or Complexity of Data Reviewed Labs: ordered. Radiology: ordered.  Risk Prescription drug management.   42 yo F with a chief complaints of abdominal pain.  This has been going on for a few days and has gotten progressively worse.  She  is quite tender on exam.  Will obtain CT imaging.  Lab work treat pain and nausea reassess.  LFTs and lipase are unremarkable.  CT scan of the abdomen pelvis is concerning for acute pancreatitis without obvious complication.  Patient is feeling much better on repeat assessments.  She would like to try and go home if possible.  3:59 AM:  I have discussed the diagnosis/risks/treatment options with the patient.  Evaluation and diagnostic testing in the emergency department does not suggest an emergent condition requiring admission or immediate intervention beyond what has been performed at this time.  They will follow up with PCP, GI. We also discussed returning to the ED immediately if new or worsening sx occur. We discussed the sx which are most concerning (e.g., sudden worsening pain, fever, inability to tolerate by mouth) that necessitate immediate return. Medications administered to the patient during their visit and any new prescriptions provided to the patient are listed below.  Medications given during this visit Medications  HYDROmorphone (DILAUDID) injection 1 mg (1 mg Intravenous Given 04/21/23 0122)  ondansetron (ZOFRAN) injection 4 mg (4 mg Intravenous Given 04/21/23 0122)  lactated ringers bolus 1,000 mL (0 mLs Intravenous Stopped 04/21/23 0333)     The patient appears reasonably screen and/or stabilized for discharge and I doubt any other medical condition or other [facility] requiring further screening, evaluation, or treatment in the ED at this time prior to discharge.          Final Clinical Impression(s) / ED Diagnoses Final diagnoses:  Idiopathic acute pancreatitis, unspecified complication status    Rx / DC Orders ED Discharge Orders          Ordered    morphine (MSIR) 15 MG tablet  Every 4 hours PRN        04/21/23 0217    ondansetron (ZOFRAN-ODT) 4 MG disintegrating tablet        04/21/23 0217              Melene Plan, DO 04/21/23 [phone]

## 2023-04-21 NOTE — ED Notes (Signed)
Pt unable to leave urine sample at this time. Will try later.

## 2023-04-21 NOTE — ED Provider Notes (Signed)
[place] [facility] Provider Note   CSN: [id number] Arrival date & time: 04/21/23  2112     History  Chief Complaint  Patient presents with   Abdominal Pain   Emesis    [name] [name] is a 42 y.o. female.  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  Patient presented to the emergency department early this morning and had CT which demonstrated acute pancreatitis.  Her lipase was normal.  She was motivated to go home and was prescribed Zofran and morphine IR.  She states that her pain is controlled for about 2 and half hours before returning.  She has been unable to control her vomiting and has had multiple episodes of vomiting.  She has not urinated twice today.  She is on Mounjaro currently and has been for about 2 years.  She was told maybe the last episode of pancreatitis was precipitated by rapid weight loss related to weight loss surgery in 2018.        Home Medications Prior to Admission medications   Medication Sig Start Date End Date Taking? Authorizing Provider  aspirin EC 81 MG tablet Take 81 mg by mouth every other day. Swallow whole.    [provider]  Biotin w/ Vitamins C & E (HAIR/SKIN/NAILS PO) Take 1 tablet by mouth daily.     [provider]  clonazePAM (KLONOPIN) 0.5 MG tablet Take 1 tablet by mouth 2  times daily as needed for anxiety. 05/23/22   Carlean Jews, NP  cyanocobalamin (VITAMIN B12) 1000 MCG/ML injection Inject 1 mL (1,000 mcg total) into the skin every 14 (fourteen) days. 04/01/23   Rayburn, Fanny Bien, PA-C  levonorgestrel (MIRENA) 20 MCG/24HR IUD 1 each by Intrauterine route once.    [provider]  lubiprostone (AMITIZA) 24 MCG capsule Take 1 capsule (24 mcg total) by mouth 2 (two) times daily with a meal. 08/29/22   Boscia, Kathlynn Grate, NP  morphine (MSIR) 15 MG tablet Take 0.5 tablets (7.5 mg total) by mouth every 4 (four) hours as needed. 04/21/23   Melene Plan, DO  Multiple Vitamins-Minerals (BARIATRIC MULTIVITAMINS/IRON PO) Take 1 tablet by mouth in the morning and at bedtime.    [provider]  ondansetron (ZOFRAN-ODT) 4 MG disintegrating tablet 4mg  ODT q4 hours prn nausea/vomit 04/21/23   Melene Plan, DO  Probiotic Product (PROBIOTIC ADVANCED PO) Take 1 capsule by mouth daily.     [provider]  rosuvastatin (CRESTOR) 5 MG tablet Take 1 tablet (5 mg total) by mouth daily. 06/13/22   Carlean Jews, NP  SUMAtriptan (IMITREX) 50 MG tablet Take 1 tablet by mouth  at onset of headache. May repeat in 2 hours if symptoms are persistent 07/05/22   Boscia, Herbert Seta E, NP  SYRINGE-NEEDLE, DISP, 3 ML (B-D 3CC LUER-LOK SYR 25GX5/8") 25G X 5/8" 3 ML MISC Use to inject b12 every other week. 01/21/23   Rayburn, Fanny Bien, PA-C  tirzepatide [facility]) 7.5 MG/0.5ML Pen Inject 7.5 mg into the skin once a week. 04/02/23   Rayburn, Fanny Bien, PA-C  Vitamin D, Ergocalciferol, (DRISDOL) 1.25 MG (50000 UNIT) CAPS capsule Take 1 capsule (50,000 Units total) by mouth every 7 (seven) days. 04/01/23   Rayburn,  Fanny Bien, PA-C      Allergies    Iohexol and Pyridium [phenazopyridine hcl]    Review of Systems   Review of Systems  Physical Exam Updated Vital Signs BP 104/86 (BP Location: Right Arm)   Pulse 86   Temp 98.4 F (36.9 C)   Resp 18   SpO2 90%   Physical Exam Vitals and nursing note reviewed.  Constitutional:      General: She is not in acute distress.    Appearance: She is well-developed.  HENT:     Head: Normocephalic and atraumatic.     Right Ear: External ear normal.     Left Ear: External ear normal.     Nose: Nose normal.      Mouth/Throat:     Mouth: Mucous membranes are dry.  Eyes:     Conjunctiva/sclera: Conjunctivae normal.  Cardiovascular:     Rate and Rhythm: Normal rate and regular rhythm.     Heart sounds: No murmur heard. Pulmonary:     Effort: No respiratory distress.     Breath sounds: No wheezing, rhonchi or rales.  Abdominal:     Palpations: Abdomen is soft.     Tenderness: There is abdominal tenderness in the epigastric area, periumbilical area and left upper quadrant. There is no guarding or rebound.  Musculoskeletal:     Cervical back: Normal range of motion and neck supple.     Right lower leg: No edema.     Left lower leg: No edema.  Skin:    General: Skin is warm and dry.     Findings: No rash.  Neurological:     General: No focal deficit present.     Mental Status: She is alert. Mental status is at baseline.     Motor: No weakness.  Psychiatric:        Mood and Affect: Mood normal.     ED Results / Procedures / Treatments   Labs (all labs ordered are listed, but only abnormal results are displayed) Labs Reviewed  CBC - Abnormal; Notable for the following components:      Result Value   WBC 12.3 (*)    All other components within normal limits  URINALYSIS, ROUTINE W REFLEX MICROSCOPIC  PREGNANCY, URINE  COMPREHENSIVE METABOLIC PANEL  LIPASE, BLOOD    EKG None  Radiology CT ABDOMEN PELVIS WO CONTRAST Result Date: 04/21/2023 CLINICAL DATA:  Acute nonlocalized abdominal pain EXAM: CT ABDOMEN AND PELVIS WITHOUT CONTRAST TECHNIQUE: Multidetector CT imaging of the abdomen and pelvis was performed following the standard protocol without IV contrast. RADIATION DOSE REDUCTION: This exam was performed according to the departmental dose-optimization program which includes automated exposure control, adjustment of the mA and/or kV according to patient size and/or use of iterative reconstruction technique. COMPARISON:  09/28/2017 FINDINGS: Lower chest: No acute abnormality.  Hepatobiliary: No focal liver abnormality is seen. Status post cholecystectomy. No biliary dilatation. Pancreas: There is subtle peripancreatic inflammatory stranding involving the head and uncinate process of the pancreas in keeping with changes of acute pancreatitis. No loculated peripancreatic fluid collections. Pancreatic duct is not dilated. No parenchymal calcifications are seen. Parenchymal viability is not well assessed on this noncontrast examination. Spleen: Normal in size without focal abnormality. Adrenals/Urinary Tract: Adrenal glands are unremarkable. Kidneys are normal, without renal calculi, focal lesion, or hydronephrosis. Bladder is unremarkable. Stomach/Bowel: Surgical changes of gastric sleeve resection are identified. The stomach, small bowel, and large bowel are otherwise unremarkable. Appendix normal. No free intraperitoneal gas or fluid.  Vascular/Lymphatic: No significant vascular findings are present. No enlarged abdominal or pelvic lymph nodes. Reproductive: Intrauterine device in expected position within the endometrial cavity. The pelvic organs are otherwise unremarkable. Other: No abdominal wall hernia or abnormality. No abdominopelvic ascites. Musculoskeletal: No acute bone abnormality. No lytic or blastic bone lesion. IMPRESSION: 1. Subtle peripancreatic inflammatory stranding involving the head and uncinate process of the pancreas in keeping with changes of acute pancreatitis. No loculated peripancreatic fluid collections. Parenchymal viability is not well assessed on this noncontrast examination. Findings appear similar to that noted on prior examination 2. Surgical changes of gastric sleeve resection and cholecystectomy. Electronically Signed   By: Helyn Numbers M.D.   On: 04/21/2023 02:04    Procedures Procedures    Medications Ordered in ED Medications  sodium chloride 0.9 % bolus 1,000 mL (has no administration in time range)  HYDROmorphone (DILAUDID) injection 0.5 mg  (has no administration in time range)  ondansetron (ZOFRAN-ODT) disintegrating tablet 4 mg (4 mg Oral Given 04/21/23 2124)    ED Course/ Medical Decision Making/ A&P    Patient seen and examined. History obtained directly from patient.  Reviewed previous admission notes from 2019 in regards to her admission for pancreatitis.  Labs/EKG: Ordered CBC, CMP, lipase, UA.  Imaging: None ordered, reviewed CT from earlier this morning  Medications/Fluids: Ordered: Fluid bolus, IV Dilaudid, ODT Zofran given in triage  Most recent vital signs reviewed and are as follows: BP 104/86 (BP Location: Right Arm)   Pulse 86   Temp 98.4 F (36.9 C)   Resp 18   SpO2 90%   Initial impression: Acute pancreatitis, patient agrees to admission for symptom control.  11:25 PM Continuing to await lab work-up prior to admission.   Patient appears more comfortable, resting.  Pain better controlled at current time.  Plan: Admit.                                  Medical Decision Making Amount and/or Complexity of Data Reviewed Labs: ordered.  Risk Prescription drug management.   Admit for acute pancreatitis.          Final Clinical Impression(s) / ED Diagnoses Final diagnoses:  Acute pancreatitis without infection or necrosis, unspecified pancreatitis type    Rx / DC Orders ED Discharge Orders     None         Renne Crigler, PA-C 04/21/23 2328    Melene Plan, DO 04/22/23 [phone]

## 2023-04-21 NOTE — Discharge Instructions (Addendum)
Follow-up with your gastroenterologist in the office.  Please return for worsening pain fever inability eat or drink. Take 4 over the counter ibuprofen tablets 3 times a day or 2 over-the-counter naproxen tablets twice a day for pain. Also take tylenol 1000mg (2 extra strength) four times a day.   Then take the pain medicine if you feel like you need it. Narcotics do not help with the pain, they only make you care about it less.  You can become addicted to this, people may break into your house to steal it.  It will constipate you.  If you drive under the influence of this medicine you can get a DUI.

## 2023-04-21 NOTE — ED Triage Notes (Signed)
Pt seen this morning & diagnosed w/ pancreatitis. Declined admission however now pt reports she is unable to hold anything down, N/V abd pain.

## 2023-04-21 NOTE — ED Triage Notes (Signed)
Pt states mid abdominal pain that started on Friday and now radiating to back  Pt states h/o pancreatitis and it feels the same  Denies fever  Reports nausea and belching

## 2023-04-22 ENCOUNTER — Inpatient Hospital Stay (HOSPITAL_COMMUNITY): Payer: No Typology Code available for payment source

## 2023-04-22 DIAGNOSIS — Z823 Family history of stroke: Secondary | ICD-10-CM | POA: Diagnosis not present

## 2023-04-22 DIAGNOSIS — Z803 Family history of malignant neoplasm of breast: Secondary | ICD-10-CM | POA: Diagnosis not present

## 2023-04-22 DIAGNOSIS — Z87891 Personal history of nicotine dependence: Secondary | ICD-10-CM | POA: Diagnosis not present

## 2023-04-22 DIAGNOSIS — Z91041 Radiographic dye allergy status: Secondary | ICD-10-CM | POA: Diagnosis not present

## 2023-04-22 DIAGNOSIS — E119 Type 2 diabetes mellitus without complications: Secondary | ICD-10-CM

## 2023-04-22 DIAGNOSIS — Z818 Family history of other mental and behavioral disorders: Secondary | ICD-10-CM | POA: Diagnosis not present

## 2023-04-22 DIAGNOSIS — E282 Polycystic ovarian syndrome: Secondary | ICD-10-CM | POA: Diagnosis present

## 2023-04-22 DIAGNOSIS — R1010 Upper abdominal pain, unspecified: Secondary | ICD-10-CM | POA: Diagnosis present

## 2023-04-22 DIAGNOSIS — Z7982 Long term (current) use of aspirin: Secondary | ICD-10-CM | POA: Diagnosis not present

## 2023-04-22 DIAGNOSIS — E785 Hyperlipidemia, unspecified: Secondary | ICD-10-CM

## 2023-04-22 DIAGNOSIS — Z820 Family history of epilepsy and other diseases of the nervous system: Secondary | ICD-10-CM | POA: Diagnosis not present

## 2023-04-22 DIAGNOSIS — Z8249 Family history of ischemic heart disease and other diseases of the circulatory system: Secondary | ICD-10-CM | POA: Diagnosis not present

## 2023-04-22 DIAGNOSIS — Z833 Family history of diabetes mellitus: Secondary | ICD-10-CM | POA: Diagnosis not present

## 2023-04-22 DIAGNOSIS — F419 Anxiety disorder, unspecified: Secondary | ICD-10-CM

## 2023-04-22 DIAGNOSIS — Z800 Family history of malignant neoplasm of digestive organs: Secondary | ICD-10-CM | POA: Diagnosis not present

## 2023-04-22 DIAGNOSIS — K859 Acute pancreatitis without necrosis or infection, unspecified: Secondary | ICD-10-CM | POA: Diagnosis present

## 2023-04-22 DIAGNOSIS — Z9884 Bariatric surgery status: Secondary | ICD-10-CM | POA: Diagnosis not present

## 2023-04-22 DIAGNOSIS — Z9049 Acquired absence of other specified parts of digestive tract: Secondary | ICD-10-CM | POA: Diagnosis not present

## 2023-04-22 DIAGNOSIS — Z8616 Personal history of COVID-19: Secondary | ICD-10-CM | POA: Diagnosis not present

## 2023-04-22 DIAGNOSIS — I10 Essential (primary) hypertension: Secondary | ICD-10-CM | POA: Diagnosis present

## 2023-04-22 DIAGNOSIS — K8500 Idiopathic acute pancreatitis without necrosis or infection: Secondary | ICD-10-CM | POA: Diagnosis not present

## 2023-04-22 DIAGNOSIS — Z7985 Long-term (current) use of injectable non-insulin antidiabetic drugs: Secondary | ICD-10-CM | POA: Diagnosis not present

## 2023-04-22 DIAGNOSIS — Z8349 Family history of other endocrine, nutritional and metabolic diseases: Secondary | ICD-10-CM | POA: Diagnosis not present

## 2023-04-22 DIAGNOSIS — E663 Overweight: Secondary | ICD-10-CM

## 2023-04-22 DIAGNOSIS — Z888 Allergy status to other drugs, medicaments and biological substances status: Secondary | ICD-10-CM | POA: Diagnosis not present

## 2023-04-22 DIAGNOSIS — I9589 Other hypotension: Secondary | ICD-10-CM | POA: Diagnosis present

## 2023-04-22 DIAGNOSIS — K589 Irritable bowel syndrome without diarrhea: Secondary | ICD-10-CM | POA: Diagnosis present

## 2023-04-22 DIAGNOSIS — Z79899 Other long term (current) drug therapy: Secondary | ICD-10-CM | POA: Diagnosis not present

## 2023-04-22 LAB — URINALYSIS, ROUTINE W REFLEX MICROSCOPIC
Bilirubin Urine: NEGATIVE
Glucose, UA: NEGATIVE mg/dL
Hgb urine dipstick: NEGATIVE
Ketones, ur: NEGATIVE mg/dL
Leukocytes,Ua: NEGATIVE
Nitrite: NEGATIVE
Protein, ur: NEGATIVE mg/dL
Specific Gravity, Urine: 1.025 (ref 1.005–1.030)
pH: 5.5 (ref 5.0–8.0)

## 2023-04-22 LAB — LIPID PANEL
Cholesterol: 136 mg/dL (ref 0–200)
HDL: 46 mg/dL (ref >40)
LDL Cholesterol: 83 mg/dL (ref 0–99)
Total CHOL/HDL Ratio: 3 {ratio}
Triglycerides: 34 mg/dL (ref <150)
VLDL: 7 mg/dL (ref 0–40)

## 2023-04-22 LAB — COMPREHENSIVE METABOLIC PANEL
ALT: 15 U/L (ref 0–44)
AST: 15 U/L (ref 15–41)
Albumin: 3.4 g/dL — ABNORMAL LOW (ref 3.5–5.0)
Alkaline Phosphatase: 53 U/L (ref 38–126)
Anion gap: 6 (ref 5–15)
BUN: 12 mg/dL (ref 6–20)
CO2: 24 mmol/L (ref 22–32)
Calcium: 8.4 mg/dL — ABNORMAL LOW (ref 8.9–10.3)
Chloride: 108 mmol/L (ref 98–111)
Creatinine, Ser: 0.95 mg/dL (ref 0.44–1.00)
GFR, Estimated: >60 mL/min (ref >60)
Glucose, Bld: 81 mg/dL (ref 70–99)
Potassium: 3.9 mmol/L (ref 3.5–5.1)
Sodium: 138 mmol/L (ref 135–145)
Total Bilirubin: 0.8 mg/dL (ref <1.2)
Total Protein: 6.1 g/dL — ABNORMAL LOW (ref 6.5–8.1)

## 2023-04-22 LAB — HIV ANTIBODY (ROUTINE TESTING W REFLEX): HIV Screen 4th Generation wRfx: NONREACTIVE

## 2023-04-22 LAB — LIPASE, BLOOD: Lipase: 26 U/L (ref 11–51)

## 2023-04-22 LAB — PREGNANCY, URINE: Preg Test, Ur: NEGATIVE

## 2023-04-22 MED ORDER — ACETAMINOPHEN 325 MG PO TABS: 650.0000 mg | ORAL_TABLET | Freq: Four times a day (QID) | ORAL | Status: DC | PRN

## 2023-04-22 MED ORDER — CLONAZEPAM 0.5 MG PO TABS
0.5000 mg | ORAL_TABLET | Freq: Two times a day (BID) | ORAL | Status: DC | PRN
Start: 2023-04-22 — End: 2023-04-25
  Administered 2023-04-23 – 2023-04-24 (×2): 0.5 mg via ORAL
  Filled 2023-04-22 (×2): qty 1

## 2023-04-22 MED ORDER — METOCLOPRAMIDE HCL 5 MG/ML IJ SOLN
10.0000 mg | Freq: Once | INTRAMUSCULAR | Status: AC
  Administered 2023-04-22: 10 mg via INTRAVENOUS
  Filled 2023-04-22: qty 2

## 2023-04-22 MED ORDER — OXYCODONE-ACETAMINOPHEN 5-325 MG PO TABS
1.0000 | ORAL_TABLET | Freq: Four times a day (QID) | ORAL | Status: DC | PRN
  Administered 2023-04-23: 1 via ORAL
  Filled 2023-04-22: qty 1

## 2023-04-22 MED ORDER — LUBIPROSTONE 24 MCG PO CAPS
24.0000 ug | ORAL_CAPSULE | Freq: Two times a day (BID) | ORAL | Status: DC | PRN
  Filled 2023-04-22: qty 1

## 2023-04-22 MED ORDER — ACETAMINOPHEN 650 MG RE SUPP: 650.0000 mg | Freq: Four times a day (QID) | RECTAL | Status: DC | PRN

## 2023-04-22 MED ORDER — ALBUTEROL SULFATE (2.5 MG/3ML) 0.083% IN NEBU: 2.5000 mg | INHALATION_SOLUTION | Freq: Four times a day (QID) | RESPIRATORY_TRACT | Status: DC | PRN

## 2023-04-22 MED ORDER — DIAZEPAM 5 MG/ML IJ SOLN
2.5000 mg | Freq: Once | INTRAMUSCULAR | Status: AC
  Administered 2023-04-22: 2.5 mg via INTRAVENOUS
  Filled 2023-04-22: qty 2

## 2023-04-22 MED ORDER — LORAZEPAM 2 MG/ML IJ SOLN
0.5000 mg | Freq: Once | INTRAMUSCULAR | Status: AC | PRN
  Administered 2023-04-23: 0.5 mg via INTRAVENOUS
  Filled 2023-04-22: qty 1

## 2023-04-22 MED ORDER — SODIUM CHLORIDE 0.9 % IV SOLN: INTRAVENOUS | Status: DC

## 2023-04-22 MED ORDER — HYDROMORPHONE HCL 1 MG/ML IJ SOLN
0.5000 mg | INTRAMUSCULAR | Status: DC | PRN
  Administered 2023-04-22 – 2023-04-23 (×4): 0.5 mg via INTRAVENOUS
  Filled 2023-04-22 (×5): qty 0.5

## 2023-04-22 MED ORDER — SUMATRIPTAN SUCCINATE 50 MG PO TABS: 50.0000 mg | ORAL_TABLET | ORAL | Status: DC | PRN

## 2023-04-22 MED ORDER — DIPHENHYDRAMINE HCL 50 MG/ML IJ SOLN
25.0000 mg | Freq: Once | INTRAMUSCULAR | Status: AC
  Administered 2023-04-22: 25 mg via INTRAVENOUS
  Filled 2023-04-22: qty 1

## 2023-04-22 MED ORDER — SODIUM CHLORIDE 0.9% FLUSH
3.0000 mL | Freq: Two times a day (BID) | INTRAVENOUS | Status: DC
  Administered 2023-04-22 – 2023-04-24 (×4): 3 mL via INTRAVENOUS

## 2023-04-22 MED ORDER — HYDROMORPHONE HCL 1 MG/ML IJ SOLN
0.5000 mg | Freq: Once | INTRAMUSCULAR | Status: AC
Start: 2023-04-22 — End: 2023-04-22
  Administered 2023-04-22: 0.5 mg via INTRAVENOUS
  Filled 2023-04-22: qty 1

## 2023-04-22 MED ORDER — ENOXAPARIN SODIUM 40 MG/0.4ML IJ SOSY
40.0000 mg | PREFILLED_SYRINGE | INTRAMUSCULAR | Status: DC
  Administered 2023-04-22 – 2023-04-24 (×3): 40 mg via SUBCUTANEOUS
  Filled 2023-04-22 (×3): qty 0.4

## 2023-04-22 NOTE — Progress Notes (Signed)
Plan of Care Note for accepted transfer   Patient: [name] [name] MRN: [id number]   DOA: 04/21/2023  Facility requesting transfer: [facility]  Requesting Provider: Dr. Adela Lank   Reason for transfer: Acute pancreatitis   Facility course: 42 yr old female with hx of , PCOS, bariatric surgery, and pancreatitis who p/w abdominal pain and nausea similar to when she had pancreatitis in 2019 (etiology not identified at that time).   She is afebrile with stable vitals, reassuring labs including normal LFTs and lipase, but had CT-findings consistent with acute pancreatitis.   She had been seen for the same yesterday and attempted outpatient management but was unable to tolerate any oral intake.   She was given IVF, analgesia, and antiemetics in ED.   Plan of care: The patient is accepted for admission to Med-surg  unit, at [facility].   Author: Briscoe Deutscher, MD 04/22/2023  Check [email or web address] for on-call coverage.  Nursing staff, Please call [facility] Admits & Consults System-Wide number on Amion as soon as patient's arrival, so appropriate admitting provider can evaluate the pt.

## 2023-04-22 NOTE — H&P (Addendum)
History and Physical    Patient: [name] [name] TKZ:[id number] DOB: Jul 23, 1980 DOA: 04/21/2023 DOS: the patient was seen and examined on 04/22/2023 PCP: Carlean Jews, NP  Patient coming from: Transfer from [facility]  Chief Complaint:  Chief Complaint  Patient presents with   Abdominal Pain   Emesis   HPI: [name] [name] is a 42 y.o. female with medical history significant of hyperlipidemia, PCOS, bariatric surgery, pancreatitis. The pain started 3 days ago after consuming saltine crackers with peanut butter. Initial discomfort was noted at the top of the stomach and radiated to her back.  On the following morning the pain had intensified.  She thereafter developed nausea.   She has not had any fevers.  She had been seen at [facility] yesterday where CT scan confirmed pancreatitis diagnosis. Patient initially opted for outpatient management and was sent home with morphine and nausea medicine.  However, after getting home patient reports pain was not well-controlled with the morphine that was prescribed.  Noted associated symptoms of nausea and vomiting.  Emesis was noted to be nonbloody in appearance.  However, due to her pain being uncontrolled and persistent vomiting return back to the emergency department.  Denies any history of alcohol use and has not had any recent medication changes.  Patient states that she has been on [facility] for 3 years now and has not had any issues.  She had cholecystectomy several years ago.  Patient reports a prior episode of pancreatitis in 2019 of unknown etiology.  She had been evaluated by [facility] during her prior hospitalization and was recommended to have an MRCP possibly and/or EUS at that time if pain persistent.  She was post to follow up in outpatient setting following her hospitalization, but lost to follow-up  In the emergency department patient was noted to be afebrile with pulse 56-86, blood pressure as low as 92/49, and  all other vital signs maintained.  Labs since 12/15 noted WBC 10.4-> 12.3, sodium 134->138, calcium 8.6-> 8.4, and lipase 40->26.  CT scan of the abdomen pelvis noted subtle peripancreatic inflammation involving the head and uncinate process of the pancreas concerning for acute pancreatitis without complicating features.  Review of Systems: As mentioned in the history of present illness. All other systems reviewed and are negative. Past Medical History:  Diagnosis Date   Abnormal uterine bleeding    Amenorrhea    Anemia    Anxiety    panic attacks   Back pain    Bradycardia    Chest pain    COVID 02/2020   Diabetes (HCC)    h/o   Dysmenorrhea    Endometriosis    Gallbladder problem    GERD (gastroesophageal reflux disease)    occasional - diet controlled and tums prn   HLD (hyperlipidemia)    HTN (hypertension)    IBS (irritable bowel syndrome)    Infertility, female    Insulin resistance    Migraine without aura    Missed abortion    x 2 - both resolved without surgery   Obesity    PCOS (polycystic ovarian syndrome)    PONV (postoperative nausea and vomiting)    pe rpatient ; scopalamine patch very helpful    SVD (spontaneous vaginal delivery)    x 1   Syncope and collapse 06/21/2020   Urinary incontinence    Vitamin D deficiency    Past Surgical History:  Procedure Laterality Date   ABDOMINOPLASTY/PANNICULECTOMY WITH LIPOSUCTION N/A 08/24/2021  Procedure: ABDOMINOPLASTY/PANNICULECTOMY WITH LIPOSUCTION, LIPOSUCTION OF MONS PUBIS;  Surgeon: Peggye Form, DO;  Location: [facility];  Service: Plastics;  Laterality: N/A;   CHOLECYSTECTOMY     COLONOSCOPY  2017   polyps   CYSTOSCOPY N/A 10/29/2016   Procedure: CYSTOSCOPY;  Surgeon: Romualdo Bolk, MD;  Location: [facility];  Service: Gynecology;  Laterality: N/A;   DILATATION & CURRETTAGE/HYSTEROSCOPY WITH RESECTOCOPE N/A 10/17/2012   Procedure: DILATATION & CURETTAGE/HYSTEROSCOPY WITH RESECTOCOPE;  Possible Polypectomy, Possible Resectoscopic Myomectomy.;  Surgeon: Alphonsus Sias. Ernestina Penna, MD;  Location: [facility];  Service: Gynecology;  Laterality: N/A;  1 hr.   DILATION AND CURETTAGE OF UTERUS     ENDOMETRIAL BIOPSY     ENDOVENOUS ABLATION SAPHENOUS VEIN W/ LASER Right 05/08/2018   endovenous laser ablation right greater saphenous vein and stab phlebectomy > 20 incisions right leg by Waverly Ferrari MD    ESOPHAGOGASTRODUODENOSCOPY ENDOSCOPY  2017   Normal   HYSTEROSCOPY WITH D & C N/A 12/01/2013   Procedure: IUD Removal;  Surgeon: Alphonsus Sias. Ernestina Penna, MD;  Location: [facility];  Service: Gynecology;  Laterality: N/A;  90 min.   INTRAUTERINE DEVICE (IUD) INSERTION N/A 10/17/2012   Procedure: INTRAUTERINE DEVICE (IUD) INSERTION; Mirena;  Surgeon: Alphonsus Sias. Ernestina Penna, MD;  Location: [facility];  Service: Gynecology;  Laterality: N/A;   LAPAROSCOPIC GASTRIC SLEEVE RESECTION N/A 02/19/2017   Procedure: LAPAROSCOPIC GASTRIC SLEEVE RESECTION, UPPER ENDOSCOPY;  Surgeon: Luretha Murphy, MD;  Location: [facility];  Service: General;  Laterality: N/A;   LAPAROSCOPIC SALPINGO OOPHERECTOMY Left 10/29/2016   Procedure: LAPAROSCOPIC SALPINGO OOPHORECTOMY With anterior uterine biopsy;  Surgeon: Romualdo Bolk, MD;  Location: [facility];  Service: Gynecology;  Laterality: Left;   LASIK  10/06/2018   LOOP RECORDER INSERTION N/A 06/22/2020   Procedure: LOOP RECORDER INSERTION;  Surgeon: Marinus Maw, MD;  Location: [facility];  Service: Cardiovascular;  Laterality: N/A;   MOUTH SURGERY     wisdom teeth   Social History:  reports that she quit smoking about 6 years ago. Her smoking use included cigarettes. She started smoking about 25 years ago. She has a 4.8 pack-year smoking history. She has never used smokeless tobacco. She reports current alcohol use. She reports that she does not use drugs.  Allergies  Allergen Reactions   Iohexol Hives     Code: HIVES, Desc: *NEED 13 HR PREDISONE AND BENADRYL  BE FOR IV  CONTRAST GIVEN* /DR OLSON PT DEVELOPED HIVES AND ROOF OF MOUTH BEGIN "ITCHING"", Onset Date: [id number]    Pyridium [Phenazopyridine Hcl] Other (See Comments)    vaginitis symptoms when on it    Family History  Problem Relation Age of Onset   Heart disease Mother        MVP   Hypertension Mother    Diabetes Mother    Depression Mother    Obesity Mother    Diabetes Father    Thyroid disease Father    Hypertension Father    Parkinson's disease Father    Dementia Father    Hyperlipidemia Father    Heart disease Father    Sleep apnea Father    Obesity Father    Colon cancer Maternal Uncle    Breast cancer Paternal Aunt    Diabetes Maternal Grandmother    Heart disease Maternal Grandmother    Diabetes Maternal Grandfather    Heart disease Maternal Grandfather    Stroke Maternal Grandfather    Diabetes Paternal Grandmother    Stroke Paternal Grandfather  Diabetes Paternal Grandfather    Heart disease Paternal Grandfather    Alcohol abuse Paternal Grandfather     Prior to Admission medications   Medication Sig Start Date End Date Taking? Authorizing Provider  aspirin EC 81 MG tablet Take 81 mg by mouth every other day. Swallow whole.    [provider]  Biotin w/ Vitamins C & E (HAIR/SKIN/NAILS PO) Take 1 tablet by mouth daily.     [provider]  clonazePAM (KLONOPIN) 0.5 MG tablet Take 1 tablet by mouth 2  times daily as needed for anxiety. 05/23/22   Carlean Jews, NP  cyanocobalamin (VITAMIN B12) 1000 MCG/ML injection Inject 1 mL (1,000 mcg total) into the skin every 14 (fourteen) days. 04/01/23   Rayburn, Fanny Bien, PA-C  levonorgestrel (MIRENA) 20 MCG/24HR IUD 1 each by Intrauterine route once.    [provider]  lubiprostone (AMITIZA) 24 MCG capsule Take 1 capsule (24 mcg total) by mouth 2 (two) times daily with a meal. 08/29/22   Boscia, Kathlynn Grate, NP  morphine (MSIR) 15 MG tablet Take 0.5 tablets (7.5 mg total) by mouth every 4  (four) hours as needed. 04/21/23   Melene Plan, DO  Multiple Vitamins-Minerals (BARIATRIC MULTIVITAMINS/IRON PO) Take 1 tablet by mouth in the morning and at bedtime.    [provider]  ondansetron (ZOFRAN-ODT) 4 MG disintegrating tablet 4mg  ODT q4 hours prn nausea/vomit 04/21/23   Melene Plan, DO  Probiotic Product (PROBIOTIC ADVANCED PO) Take 1 capsule by mouth daily.     [provider]  rosuvastatin (CRESTOR) 5 MG tablet Take 1 tablet (5 mg total) by mouth daily. 06/13/22   Carlean Jews, NP  SUMAtriptan (IMITREX) 50 MG tablet Take 1 tablet by mouth  at onset of headache. May repeat in 2 hours if symptoms are persistent 07/05/22   Boscia, Herbert Seta E, NP  SYRINGE-NEEDLE, DISP, 3 ML (B-D 3CC LUER-LOK SYR 25GX5/8") 25G X 5/8" 3 ML MISC Use to inject b12 every other week. 01/21/23   Rayburn, Fanny Bien, PA-C  tirzepatide [facility]) 7.5 MG/0.5ML Pen Inject 7.5 mg into the skin once a week. 04/02/23   Rayburn, Fanny Bien, PA-C  Vitamin D, Ergocalciferol, (DRISDOL) 1.25 MG (50000 UNIT) CAPS capsule Take 1 capsule (50,000 Units total) by mouth every 7 (seven) days. 04/01/23   Darrol Poke, PA-C    Physical Exam: Vitals:   04/22/23 0700 04/22/23 0745 04/22/23 0853 04/22/23 0855  BP: (!) 86/61 104/70 (!) 96/38 (!) 92/49  Pulse: 65 69 70   Resp:   16   Temp:   98.5 F (36.9 C)   TempSrc:   Oral   SpO2: 100% 100% 100%   Exam  Constitutional: Middle-aged female currently in no acute distress Eyes: PERRL, lids and conjunctivae normal ENMT: Mucous membranes are moist. Normal dentition.  Neck: normal, supple  Respiratory: clear to auscultation bilaterally, no wheezing, no crackles. Normal respiratory effort. No accessory muscle use.  Cardiovascular: Regular rate and rhythm, no murmurs / rubs / gallops. No extremity edema.   Abdomen: Mild epigastric tenderness to palpation present.  Bowel sounds positive.  Musculoskeletal: no clubbing / cyanosis. No joint  deformity upper and lower extremities. Good ROM, no contractures. Normal muscle tone.  Skin: no rashes, lesions, ulcers. No induration Neurologic: CN 2-12 grossly intact.  Strength 5/5 in all 4.  Psychiatric: Normal judgment and insight. Alert and oriented x 3. Normal mood.   Data Reviewed:  Reviewed labs, imaging, and pertinent records as documented  Assessment and Plan:  Pancreatitis History of pancreatitis Patient presents with epigastric pain with radiation to the back.  CT imaging noted subtle peripancreatic inflammation stranding involving the head and uncinate process of the pancreas in keeping with changes of acute pancreatitis.    Denies any use of alcohol and is status post cholecystectomy. During previous hospitalization back in 2019 no clear cause for patient's symptoms was identified.  She was supposed to follow-up in outpatient setting with [facility], but had not followed up.  MRCP and/or EUS was recommended during last hospitalization.  Review of patient's medication list includes Greggory Keen which may be a possible cause but patient reports being on it for 3 years. -Admit to a MedSurg bed -Monitor intake and output -Check lipid panel -Check MRCP -Oxycodone/Dilaudid IV as needed for moderate to severe pain respectively -Continue normal saline IV fluids at 150 mL/h -Antiemetics as needed -Consider need to consult [facility] in a.m.  Controlled diabetes mellitus type 2, without long-term use of insulin Last available hemoglobin A1c was 5.2 when checked on 12/10/2022.  Patient is not on any medications for treatment.  Hyperlipidemia Patient reported that she had no longer been taking Crestor -Follow-up lipid panel  S/p gastric sleeve Patient status post laparoscopic sleeve gastrectomy back in 2018 by Dr. Daphine Deutscher.  Anxiety -Continue clonazepam as needed  Overweight BMI 27.9 kg/m -Consider possible need of discontinuation of Mounjaro  DVT prophylaxis: Lovenox [facility]  Planning:   Code Status: Full Code   Consults: None  Family Communication: none  Severity of Illness: The appropriate patient status for this patient is INPATIENT. Inpatient status is judged to be reasonable and necessary in order to provide the required intensity of service to ensure the patient's safety. The patient's presenting symptoms, physical exam findings, and initial radiographic and laboratory data in the context of their chronic comorbidities is felt to place them at high risk for further clinical deterioration. Furthermore, it is not anticipated that the patient will be medically stable for discharge from the hospital within 2 midnights of admission.   * I certify that at the point of admission it is my clinical judgment that the patient will require inpatient hospital care spanning beyond 2 midnights from the point of admission due to high intensity of service, high risk for further deterioration and high frequency of surveillance required.*  Author: Clydie Braun, MD 04/22/2023 9:17 AM  For on call review [email or web address].

## 2023-04-22 NOTE — Plan of Care (Signed)

## 2023-04-23 ENCOUNTER — Telehealth: Payer: Self-pay

## 2023-04-23 DIAGNOSIS — I9589 Other hypotension: Secondary | ICD-10-CM

## 2023-04-23 DIAGNOSIS — E785 Hyperlipidemia, unspecified: Secondary | ICD-10-CM | POA: Diagnosis not present

## 2023-04-23 DIAGNOSIS — E663 Overweight: Secondary | ICD-10-CM | POA: Diagnosis not present

## 2023-04-23 DIAGNOSIS — K859 Acute pancreatitis without necrosis or infection, unspecified: Secondary | ICD-10-CM | POA: Diagnosis not present

## 2023-04-23 DIAGNOSIS — I10 Essential (primary) hypertension: Secondary | ICD-10-CM

## 2023-04-23 DIAGNOSIS — E119 Type 2 diabetes mellitus without complications: Secondary | ICD-10-CM | POA: Diagnosis not present

## 2023-04-23 LAB — COMPREHENSIVE METABOLIC PANEL
ALT: 10 U/L (ref 0–44)
AST: 11 U/L — ABNORMAL LOW (ref 15–41)
Albumin: 2.7 g/dL — ABNORMAL LOW (ref 3.5–5.0)
Alkaline Phosphatase: 45 U/L (ref 38–126)
Anion gap: 9 (ref 5–15)
BUN: 10 mg/dL (ref 6–20)
CO2: 21 mmol/L — ABNORMAL LOW (ref 22–32)
Calcium: 8.2 mg/dL — ABNORMAL LOW (ref 8.9–10.3)
Chloride: 107 mmol/L (ref 98–111)
Creatinine, Ser: 0.66 mg/dL (ref 0.44–1.00)
GFR, Estimated: >60 mL/min (ref >60)
Glucose, Bld: 71 mg/dL (ref 70–99)
Potassium: 3.7 mmol/L (ref 3.5–5.1)
Sodium: 137 mmol/L (ref 135–145)
Total Bilirubin: 1 mg/dL (ref <1.2)
Total Protein: 5.3 g/dL — ABNORMAL LOW (ref 6.5–8.1)

## 2023-04-23 LAB — CBC
HCT: 31.4 % — ABNORMAL LOW (ref 36.0–46.0)
Hemoglobin: 10.8 g/dL — ABNORMAL LOW (ref 12.0–15.0)
MCH: 30.1 pg (ref 26.0–34.0)
MCHC: 34.4 g/dL (ref 30.0–36.0)
MCV: 87.5 fL (ref 80.0–100.0)
Platelets: 163 10*3/uL (ref 150–400)
RBC: 3.59 MIL/uL — ABNORMAL LOW (ref 3.87–5.11)
RDW: 12.2 % (ref 11.5–15.5)
WBC: 6.1 10*3/uL (ref 4.0–10.5)
nRBC: 0 % (ref 0.0–0.2)

## 2023-04-23 LAB — LIPASE, BLOOD: Lipase: 24 U/L (ref 11–51)

## 2023-04-23 MED ORDER — LACTATED RINGERS IV SOLN: INTRAVENOUS | Status: DC

## 2023-04-23 MED ORDER — SODIUM CHLORIDE 0.9 % IV BOLUS
1000.0000 mL | Freq: Once | INTRAVENOUS | Status: AC
  Administered 2023-04-23: 1000 mL via INTRAVENOUS

## 2023-04-23 MED ORDER — GADOBUTROL 1 MMOL/ML IV SOLN
9.0000 mL | Freq: Once | INTRAVENOUS | Status: AC | PRN
Start: 2023-04-23 — End: 2023-04-23
  Administered 2023-04-23: 9 mL via INTRAVENOUS

## 2023-04-23 MED ORDER — OXYCODONE-ACETAMINOPHEN 5-325 MG PO TABS
2.0000 | ORAL_TABLET | ORAL | Status: DC | PRN
  Administered 2023-04-23 – 2023-04-24 (×5): 2 via ORAL
  Filled 2023-04-23 (×5): qty 2

## 2023-04-23 NOTE — Assessment & Plan Note (Signed)
04-23-2023 BMI 27. Pt has lost about 200 lbs with gastric surgery and GLP-1 medications. 04-24-2023 BMI 27.79

## 2023-04-23 NOTE — Assessment & Plan Note (Signed)
04-23-2023 stable.

## 2023-04-23 NOTE — Subjective & Objective (Signed)
Pt seen and examined. Stable overnight. Some nausea depending on the flavors of [place] ice. Ask pt to stick to watered down apple juice, gatorade and water.  Labs show no signs of dehydration. Explained to patient that nationally, we are still in state of shortage with IVF. If she is able to tolerate liquids, then IVF will be stopped.  Pt states she is no longer having flank pain that radiates into her back. Only epigastric pain now.

## 2023-04-23 NOTE — Consult Note (Signed)
Consultation  Referring Provider: [facility]/Chen Primary Care Physician:  Carlean Jews, NP Primary Gastroenterologist:  Dr.Gu[facility] only 2019  Reason for Consultation: Recurrent pancreatitis  HPI: [name] [name] is a 42 y.o. female, who presented to the emergency room yesterday with acute abdominal pain progressive over 2 days and associated with nausea and vomiting.  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  She also has history of diabetes mellitus, anxiety, PCOS, and a previous episode of pancreatitis in 2019 which at that time was felt idiopathic.  She states she was not on any prescription medications at that time. She was seen by GI during that hospitalization and recommendation was for consideration of EUS if she should have recurrence.  Did have a IgG4 level done at that time which was normal.  CT of the abdomen pelvis done on 04/21/2023 with contrast shows no focal liver abnormality, postcholecystectomy, there is subtle peripancreatic inflammatory stranding around the head and uncinate process, and evidence of prior gastric sleeve  MRI/MRCP shows patient to be status postcholecystectomy with CBD of 7 mm, no pancreatic ductal dilation, no pancreas to be some.  There is mild increased signal intensity in the pancreatic head and uncinate process consistent with a mild focal pancreatitis  Initial labs 05/21/2022 with lipase of 40 WBC 12.3/globin 13.3 Potassium 4.0/BUN 13/creatinine 0.93 LFTs within normal limits  Today-lipase 24 LFTs within normal limits WBC 6.1/hemoglobin 10.8  Patient has been on Mounjaro over the past 3 years for diabetes, she had been on Crestor but stopped that about a month ago and does not intend to go back on a statin. She says that her weight has been stable over the past couple of years, after her gastric sleeve procedure she gradually lost from 356  pounds to 170-180 and has been stable without weight.  She has not been on any new medications, antibiotics ,supplements etc., no EtOH use and no family history of pancreatitis. States that her pain was a 10 out of 10 in the emergency room, and that pain level is stable today, down to a 6 out of 10 as long as she is able to get regular pain medication.  Feeling pain across the upper abdomen and into her back, 1 episode of vomiting earlier today.   Past Medical History:  Diagnosis Date   Abnormal uterine bleeding    Amenorrhea    Anemia    Anxiety    panic attacks   Back pain    Bradycardia    Chest pain    COVID 02/2020   Diabetes (HCC)    h/o   Dysmenorrhea    Endometriosis    Gallbladder problem    GERD (gastroesophageal reflux disease)    occasional - diet controlled and tums prn   HLD (hyperlipidemia)    HTN (hypertension)    IBS (irritable bowel syndrome)    Infertility, female    Insulin resistance    Migraine without aura    Missed abortion    x 2 - both resolved without surgery   Obesity    PCOS (polycystic ovarian syndrome)    PONV (postoperative nausea and vomiting)    pe rpatient ; scopalamine patch very helpful    SVD (spontaneous vaginal delivery)    x 1   Syncope and collapse 06/21/2020   Urinary incontinence    Vitamin D deficiency     Past Surgical History:  Procedure Laterality Date   ABDOMINOPLASTY/PANNICULECTOMY WITH LIPOSUCTION N/A 08/24/2021   Procedure: ABDOMINOPLASTY/PANNICULECTOMY WITH LIPOSUCTION, LIPOSUCTION OF MONS PUBIS;  Surgeon: Peggye Form, DO;  Location:  [facility];  Service: Plastics;  Laterality: N/A;   CHOLECYSTECTOMY     COLONOSCOPY  2017   polyps   CYSTOSCOPY N/A 10/29/2016   Procedure: CYSTOSCOPY;  Surgeon: Romualdo Bolk, MD;  Location: [facility];  Service: Gynecology;  Laterality: N/A;   DILATATION & CURRETTAGE/HYSTEROSCOPY WITH RESECTOCOPE N/A 10/17/2012   Procedure: DILATATION &  CURETTAGE/HYSTEROSCOPY WITH RESECTOCOPE; Possible Polypectomy, Possible Resectoscopic Myomectomy.;  Surgeon: Alphonsus Sias. Ernestina Penna, MD;  Location: [facility];  Service: Gynecology;  Laterality: N/A;  1 hr.   DILATION AND CURETTAGE OF UTERUS     ENDOMETRIAL BIOPSY     ENDOVENOUS ABLATION SAPHENOUS VEIN W/ LASER Right 05/08/2018   endovenous laser ablation right greater saphenous vein and stab phlebectomy > 20 incisions right leg by Waverly Ferrari MD    ESOPHAGOGASTRODUODENOSCOPY ENDOSCOPY  2017   Normal   HYSTEROSCOPY WITH D & C N/A 12/01/2013   Procedure: IUD Removal;  Surgeon: Alphonsus Sias. Ernestina Penna, MD;  Location: [facility];  Service: Gynecology;  Laterality: N/A;  90 min.   INTRAUTERINE DEVICE (IUD) INSERTION N/A 10/17/2012   Procedure: INTRAUTERINE DEVICE (IUD) INSERTION; Mirena;  Surgeon: Alphonsus Sias. Ernestina Penna, MD;  Location: [facility];  Service: Gynecology;  Laterality: N/A;   LAPAROSCOPIC GASTRIC SLEEVE RESECTION N/A 02/19/2017   Procedure: LAPAROSCOPIC GASTRIC SLEEVE RESECTION, UPPER ENDOSCOPY;  Surgeon: Luretha Murphy, MD;  Location: [facility];  Service: General;  Laterality: N/A;   LAPAROSCOPIC SALPINGO OOPHERECTOMY Left 10/29/2016   Procedure: LAPAROSCOPIC SALPINGO OOPHORECTOMY With anterior uterine biopsy;  Surgeon: Romualdo Bolk, MD;  Location: [facility];  Service: Gynecology;  Laterality: Left;   LASIK  10/06/2018   LOOP RECORDER INSERTION N/A 06/22/2020   Procedure: LOOP RECORDER INSERTION;  Surgeon: Marinus Maw, MD;  Location: [facility];  Service: Cardiovascular;  Laterality: N/A;   MOUTH SURGERY     wisdom teeth    Prior to Admission medications   Medication Sig Start Date End Date Taking? Authorizing Provider  Biotin w/ Vitamins C & E (HAIR/SKIN/NAILS PO) Take 1 tablet by mouth daily.    Yes [provider]  clonazePAM (KLONOPIN) 0.5 MG tablet Take 1 tablet by mouth 2  times daily as needed for anxiety. 05/23/22  Yes Boscia, Kathlynn Grate, NP  cyanocobalamin (VITAMIN B12)  1000 MCG/ML injection Inject 1 mL (1,000 mcg total) into the skin every 14 (fourteen) days. 04/01/23  Yes Rayburn, Fanny Bien, PA-C  levonorgestrel (MIRENA) 20 MCG/24HR IUD 1 each by Intrauterine route once.   Yes [provider]  lubiprostone (AMITIZA) 24 MCG capsule Take 1 capsule (24 mcg total) by mouth 2 (two) times daily with a meal. Patient taking differently: Take 24 mcg by mouth 2 (two) times daily as needed for constipation. 08/29/22  Yes Boscia, Kathlynn Grate, NP  morphine (MSIR) 15 MG tablet Take 0.5 tablets (7.5 mg total) by mouth every 4 (four) hours as needed. 04/21/23  Yes Melene Plan, DO  Multiple Vitamins-Minerals (BARIATRIC MULTIVITAMINS/IRON PO) Take 1 tablet by mouth in the morning and at bedtime.   Yes [provider]  ondansetron (ZOFRAN-ODT) 4 MG disintegrating tablet 4mg  ODT q4 hours prn nausea/vomit 04/21/23  Yes Melene Plan, DO  Probiotic Product (PROBIOTIC ADVANCED PO) Take 1 capsule by mouth daily.    Yes [provider]  SUMAtriptan (IMITREX) 50 MG tablet Take  1 tablet by mouth  at onset of headache. May repeat in 2 hours if symptoms are persistent 07/05/22  Yes Boscia, Kathlynn Grate, NP  tirzepatide [facility]) 7.5 MG/0.5ML Pen Inject 7.5 mg into the skin once a week. 04/02/23  Yes Rayburn, Fanny Bien, PA-C  Vitamin D, Ergocalciferol, (DRISDOL) 1.25 MG (50000 UNIT) CAPS capsule Take 1 capsule (50,000 Units total) by mouth every 7 (seven) days. 04/01/23  Yes Rayburn, Fanny Bien, PA-C  aspirin EC 81 MG tablet Take 81 mg by mouth every other day. Swallow whole. Patient not taking: Reported on 04/22/2023    [provider]  rosuvastatin (CRESTOR) 5 MG tablet Take 1 tablet (5 mg total) by mouth daily. Patient not taking: Reported on 04/22/2023 06/13/22   Carlean Jews, NP  SYRINGE-NEEDLE, DISP, 3 ML (B-D 3CC LUER-LOK SYR 25GX5/8") 25G X 5/8" 3 ML MISC Use to inject b12 every other week. 01/21/23   Rayburn, Fanny Bien, PA-C     Current Facility-Administered Medications  Medication Dose Route Frequency Provider Last Rate Last Admin   0.9 %  sodium chloride infusion   Intravenous Continuous Madelyn Flavors A, MD 150 mL/hr at 04/23/23 1105 New Bag at 04/23/23 1105   acetaminophen (TYLENOL) tablet 650 mg  650 mg Oral Q6H PRN Clydie Braun, MD       Or   acetaminophen (TYLENOL) suppository 650 mg  650 mg Rectal Q6H PRN Madelyn Flavors A, MD       albuterol (PROVENTIL) (2.5 MG/3ML) 0.083% nebulizer solution 2.5 mg  2.5 mg Nebulization Q6H PRN Clydie Braun, MD       clonazePAM (KLONOPIN) tablet 0.5 mg  0.5 mg Oral BID PRN Madelyn Flavors A, MD       enoxaparin (LOVENOX) injection 40 mg  40 mg Subcutaneous Q24H Smith, Rondell A, MD   40 mg at 04/23/23 1122   HYDROmorphone (DILAUDID) injection 0.5 mg  0.5 mg Intravenous Q3H PRN Madelyn Flavors A, MD   0.5 mg at 04/23/23 8469   lubiprostone (AMITIZA) capsule 24 mcg  24 mcg Oral BID PRN Clydie Braun, MD       oxyCODONE-acetaminophen (PERCOCET/ROXICET) 5-325 MG per tablet 1 tablet  1 tablet Oral Q6H PRN Clydie Braun, MD   1 tablet at 04/23/23 0751   promethazine (PHENERGAN) 25 mg in sodium chloride 0.9 % 50 mL IVPB  25 mg Intravenous Q6H PRN Renne Crigler, PA-C 150 mL/hr at 04/23/23 1105 25 mg at 04/23/23 1105   sodium chloride flush (NS) 0.9 % injection 3 mL  3 mL Intravenous Q12H Smith, Rondell A, MD   3 mL at 04/22/23 1100   SUMAtriptan (IMITREX) tablet 50 mg  50 mg Oral Q2H PRN Madelyn Flavors A, MD        Allergies as of 04/21/2023 - Review Complete 04/21/2023  Allergen Reaction Noted   Iohexol Hives 10/18/2008   Pyridium [phenazopyridine hcl] Other (See Comments) 05/07/2019    Family History  Problem Relation Age of Onset   Heart disease Mother        MVP   Hypertension Mother    Diabetes Mother    Depression Mother    Obesity Mother    Diabetes Father    Thyroid disease Father    Hypertension Father    Parkinson's disease Father    Dementia  Father    Hyperlipidemia Father    Heart disease Father    Sleep apnea Father    Obesity Father    Colon cancer  Maternal Uncle    Breast cancer Paternal Aunt    Diabetes Maternal Grandmother    Heart disease Maternal Grandmother    Diabetes Maternal Grandfather    Heart disease Maternal Grandfather    Stroke Maternal Grandfather    Diabetes Paternal Grandmother    Stroke Paternal Grandfather    Diabetes Paternal Grandfather    Heart disease Paternal Grandfather    Alcohol abuse Paternal Grandfather     Social History   Socioeconomic History   Marital status: Married    Spouse name: Shanaye Schuldt   Number of children: 1   Years of education: Not on file   Highest education level: Not on file  Occupational History   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  Other Topics Concern   Not on file  Social History Narrative   Lives alone, going through separation.   Social Drivers of Corporate investment banker Strain: Not on file  Food Insecurity: No Food Insecurity (04/22/2023)   Hunger Vital Sign    Worried About Running Out of Food in the Last Year: Never true    Ran Out of Food in the Last Year: Never true  Transportation Needs: No Transportation Needs (04/22/2023)   PRAPARE - Administrator, Civil Service (Medical): No    Lack of Transportation (Non-Medical): No  Physical Activity: Not on file  Stress: Not on file  Social Connections: Not on file  Intimate  Partner Violence: Not At Risk (04/22/2023)   Humiliation, Afraid, Rape, and Kick questionnaire    Fear of Current or Ex-Partner: No    Emotionally Abused: No    Physically Abused: No    Sexually Abused: No    Review of Systems: Pertinent positive and negative review of systems were noted in the above HPI section.  All other review of systems was otherwise negative.   Physical Exam: Vital signs in last 24 hours: Temp:  [98.2 F (36.8 C)-98.6 F (37 C)] 98.2 F (36.8 C) (12/17 0758) Pulse Rate:  [65-81] 65 (12/17 0758) Resp:  [18-19] 18 (12/17 0758) BP: (79-97)/(45-84) 94/47 (12/17 0758) SpO2:  [97 %-100 %] 100 % (12/17 0758) Last BM Date : 04/20/23 General:   Alert,  Well-developed, well-nourished, pleasant WF, cooperative in NAD Head:  Normocephalic and atraumatic. Eyes:  Sclera clear, no icterus.   Conjunctiva pink. Ears:  Normal auditory acuity. Nose:  No deformity, discharge,  or lesions. Mouth:  No deformity or lesions.   Neck:  Supple; no masses or thyromegaly. Lungs:  Clear throughout to auscultation.   No wheezes, crackles, or rhonchi. Heart:  Regular rate and rhythm; no murmurs, clicks, rubs,  or gallops. Abdomen:  Soft,nontender, BS active,nonpalp mass or hsm.   Rectal:  Deferred  Msk:  Symmetrical without gross deformities. . Pulses:  Normal pulses noted. Extremities:  Without clubbing or edema. Neurologic:  Alert and  oriented x4;  grossly normal neurologically. Skin:  Intact without significant lesions or rashes.. Psych:  Alert and cooperative. Normal mood and affect.  Intake/Output from previous day: 12/16 0701 - 12/17 0700 In: 1100 [I.V.:1000; IV Piggyback:100] Out: -  Intake/Output this shift: No intake/output data recorded.  Lab Results: Recent Labs    04/21/23 0130 04/21/23 2129 04/23/23 0551  WBC 10.4 12.3* 6.1  HGB 12.2 13.3 10.8*  HCT 34.8* 38.2 31.4*  PLT 232 232 163   BMET Recent Labs    04/21/23 0130 04/22/23 0118 04/23/23 0551  NA  134* 138 137  K 4.0 3.9 3.7  CL 105 108 107  CO2 23 24 21*  GLUCOSE 84 81 71  BUN 13 12 10   CREATININE 0.93 0.95 0.66  CALCIUM 8.6* 8.4* 8.2*   LFT Recent Labs    04/23/23 0551  PROT 5.3*  ALBUMIN 2.7*  AST 11*  ALT 10  ALKPHOS 45  BILITOT 1.0   PT/INR No results for input(s): "LABPROT", "INR" in the last 72 hours. Hepatitis Panel No results for input(s): "HEPBSAG", "HCVAB", "HEPAIGM", "HEPBIGM" in the last 72 hours.     IMPRESSION:  #36 42 year old white female with acute mild pancreatitis, presenting with 2-day history of progressive upper abdominal pain.  She has had a normal lipase but on CT scan and on MRI has subtle pancreatic head inflammatory changes. Patient had 1 previous episode of pancreatitis in 2019 for which she was briefly hospitalized, workup at that time was negative including workup for autoimmune pancreatitis and she was labeled as idiopathic.  She had recently been on 2 medications associated with pancreatitis, Crestor which she says she stopped about a month ago and has been on [facility] for about 3 years.  MRI/MRCP does not show any evidence of pancreas divisum or any chronic pancreatitis or other ductal abnormalities  Etiology of this episode of pancreatitis is not clear, will need to rule out microlithiasis, subtle ampullary abnormality, versus medication induced i.e. Mounjaro  Currently this episode of pancreatitis appears mild, no concerning parameters  #2 status post remote cholecystectomy 2010 #3.  Status post gastric sleeve 2018 with significant weight loss #4  Diabetes mellitus  PLAN: Continue clear liquid diet today, would not advance until she is keeping down clears well with no further vomiting Pain management as during Lactated Ringer's at 100 cc/h for at least another 24 hours Patient did not intend to go back on Crestor, will stay off statins for now Would also advise she stop Mounjaro and discuss with her PCP for an alternative  management for diabetes  She should have endoscopic ultrasound once this episode completely resolves.  We can arrange for that with Dr. Meridee Score in a couple of months as an outpatient.  Follow-up labs in a.m. We will reassess in a.m.   Rand Boller EsterwoodPA-C  04/23/2023, 12:07 PM

## 2023-04-23 NOTE — Plan of Care (Signed)

## 2023-04-23 NOTE — Assessment & Plan Note (Signed)
04-23-2023 pt states she has normal SBP as low as 70s at home and is not symptomatic. Have changed call parameters for SBP. Change IVF to LR.  Per cardiology notes in 09-2021. Pt is intolerant to florineft and midodrine.

## 2023-04-23 NOTE — Assessment & Plan Note (Signed)
04-23-2023 chronic.

## 2023-04-23 NOTE — Telephone Encounter (Signed)
-----   Message from [facility] sent at 04/23/2023  3:30 PM EST ----- Regarding: RE: EUS Kaden Daughdrill, Please set up for Upper EUS in next 1-2 months as able. Thanks. GM ----- Message ----- From: Sammuel Cooper, PA-C Sent: 04/23/2023   3:16 PM EST To: Loretha Stapler, RN; Beverley Fiedler, MD; # Subject: EUS                                            Liz Beach , this patient is currently hospitalized at [facility] with an episode of recurrent pancreatitis which is mild fortunately She had a similar episode in 2019, status post cholecystectomy 2010 and is status post gastric sleeve   This episode  may be medication induced i.e. Mounjaro which we will hold. Would like her to be scheduled for an EUS in a couple of months to make sure no evidence of microlithiasis or other subtle ampullary abnormality.  Thank you

## 2023-04-23 NOTE — Progress Notes (Signed)
PROGRESS NOTE    [name] [name] [facility]  HEN:[id number] DOB: December 10, 1980 DOA: 04/21/2023 PCP: [name] Jews, NP  Subjective: Pt seen and examined. Pt with nearly 200 lbs weight loss since gastric bypass surgery and GLP-1.  Has been on mounjaro for 3 years. Took a dose of mounjaro on Thursday and started having abd pain 2 days later on Saturday.  Pt with hx of chronic hypotension. Even had a loop recorder placed to evaluate for arrhythmias as cause of her hypotension.  Per cardiology noted 10-04-2021, pt "Previously intolerant of Florinef with nausea, did not tolerate midodrine."  Pt states she is not surprised by SBP in the 70s, 80s or 90s. She is asymptomatic. Pt states she does not feel dizzy when SBP 70s-90s.  Pt still on IVF. Only tolerating clear liquids.   Hospital Course: HPI: [name] [name] is a 42 y.o. female with medical history significant of hyperlipidemia, PCOS, bariatric surgery, pancreatitis. The pain started 3 days ago after consuming saltine crackers with peanut butter. Initial discomfort was noted at the top of the stomach and radiated to her back.  On the following morning the pain had intensified.  She thereafter developed nausea.   She has not had any fevers.  She had been seen at [facility] yesterday where CT scan confirmed pancreatitis diagnosis. Patient initially opted for outpatient management and was sent home with morphine and nausea medicine.  However, after getting home patient reports pain was not well-controlled with the morphine that was prescribed.  Noted associated symptoms of nausea and vomiting.  Emesis was noted to be nonbloody in appearance.  However, due to her pain being uncontrolled and persistent vomiting return back to the emergency department.  Denies any history of alcohol use and has not had any recent medication changes.  Patient states that she has been on [facility] for 3 years now and has not had any issues.  She had cholecystectomy several  years ago.   Patient reports a prior episode of pancreatitis in 2019 of unknown etiology.  She had been evaluated by [facility] during her prior hospitalization and was recommended to have an MRCP possibly and/or EUS at that time if pain persistent.  She was post to follow up in outpatient setting following her hospitalization, but lost to follow-up   In the emergency department patient was noted to be afebrile with pulse 56-86, blood pressure as low as 92/49, and all other vital signs maintained.  Labs since 12/15 noted WBC 10.4-> 12.3, sodium 134->138, calcium 8.6-> 8.4, and lipase 40->26.  CT scan of the abdomen pelvis noted subtle peripancreatic inflammation involving the head and uncinate process of the pancreas concerning for acute pancreatitis without complicating features.  Significant Events: Admitted 04/21/2023 for acute pancreatitis   Significant Labs: Labs since 12/15 noted WBC 10.4-> 12.3, sodium 134->138, calcium 8.6-> 8.4, and lipase 40->26   Significant Imaging Studies: Labs since 12/15 noted WBC 10.4-> 12.3, sodium 134->138, calcium 8.6-> 8.4, and lipase 40->26   Antibiotic Therapy: Anti-infectives (From admission, onward)    None       Procedures:   Consultants:     Assessment and Plan: * Acute pancreatitis 04-23-2023 unclear the cause. MRCP negative for stone. May be due to [facility]. Change IVF to LR due to poor po intake. Changed po pain meds to percocet 10 mg q4h prn. I asked pt not to use IV dilaudid unless severe/breakthrough pain.  Chronic hypotension - Previously intolerant of Florinef with nausea, did not tolerate midodrine.  04-23-2023 pt states she has normal SBP as low as 70s at home and is not symptomatic. Have changed call parameters for SBP. Change IVF to LR.  Per cardiology notes in 09-2021. Pt is intolerant to florineft and midodrine.  Overweight (BMI 25.0-29.9) 04-23-2023 BMI 27. Pt has lost about 200 lbs with gastric surgery and GLP-1  medications.  S/P bariatric surgery 04-23-2023 stable.  HLD (hyperlipidemia) 04-23-23 stable.  Anxiety 04-23-2023 chronic.  Type 2 diabetes mellitus without complication, without long-term current use of insulin (HCC) 04-23-2023 stable.    DVT prophylaxis: enoxaparin (LOVENOX) injection 40 mg Start: 04/22/23 1100     Code Status: Full Code Family Communication: no family at bedside Disposition Plan: return home Reason for continuing need for hospitalization: remains on IVF. Continue prn IV dilaudid.  Objective: Vitals:   04/23/23 0639 04/23/23 0758 04/23/23 1115 04/23/23 1209  BP: (!) 86/58 (!) 94/47 (!) 88/58 (!) 87/55  Pulse: 69 65 67 67  Resp:  18    Temp: 98.2 F (36.8 C) 98.2 F (36.8 C)    TempSrc:  Oral    SpO2: 97% 100%    Weight:      Height:        Intake/Output Summary (Last 24 hours) at 04/23/2023 1342 Last data filed at 04/22/2023 1739 Gross per 24 hour  Intake 50 ml  Output --  Net 50 ml   Filed Weights   04/22/23 0855  Weight: 82.9 kg    Examination:  Physical Exam Vitals and nursing note reviewed.  Constitutional:      General: She is not in acute distress.    Appearance: She is not toxic-appearing or diaphoretic.  HENT:     Head: Normocephalic and atraumatic.     Nose: Nose normal.  Eyes:     General: No scleral icterus. Cardiovascular:     Rate and Rhythm: Normal rate and regular rhythm.  Pulmonary:     Effort: Pulmonary effort is normal.     Breath sounds: Normal breath sounds.  Abdominal:     General: Bowel sounds are normal. There is no distension.     Palpations: Abdomen is soft.     Tenderness: There is abdominal tenderness in the epigastric area.  Musculoskeletal:     Right lower leg: No edema.     Left lower leg: No edema.  Skin:    General: Skin is warm and dry.     Capillary Refill: Capillary refill takes less than 2 seconds.  Neurological:     General: No focal deficit present.     Mental Status: She is alert  and oriented to person, place, and time.     Data Reviewed: I have personally reviewed following labs and imaging studies  CBC: Recent Labs  Lab 04/21/23 0130 04/21/23 2129 04/23/23 0551  WBC 10.4 12.3* 6.1  NEUTROABS 6.4  --   --   HGB 12.2 13.3 10.8*  HCT 34.8* 38.2 31.4*  MCV 84.5 84.9 87.5  PLT 232 232 163   Basic Metabolic Panel: Recent Labs  Lab 04/21/23 0130 04/22/23 0118 04/23/23 0551  NA 134* 138 137  K 4.0 3.9 3.7  CL 105 108 107  CO2 23 24 21*  GLUCOSE 84 81 71  BUN 13 12 10   CREATININE 0.93 0.95 0.66  CALCIUM 8.6* 8.4* 8.2*   GFR: Estimated Creatinine Clearance: 103.4 mL/min (by C-G formula based on SCr of 0.66 mg/dL). Liver Function Tests: Recent Labs  Lab 04/21/23 0130 04/22/23 0118 04/23/23 1308  AST 24 15 11*  ALT 14 15 10   ALKPHOS 45 53 45  BILITOT 0.8 0.8 1.0  PROT 6.7 6.1* 5.3*  ALBUMIN 3.5 3.4* 2.7*   Recent Labs  Lab 04/21/23 0130 04/22/23 0118 04/23/23 0549  LIPASE 40 26 24   Lipid Profile: Recent Labs    04/22/23 1008  CHOL 136  HDL 46  LDLCALC 83  TRIG 34  CHOLHDL 3.0    Radiology Studies: MR ABDOMEN MRCP W WO CONTAST Result Date: 04/23/2023 CLINICAL DATA:  Acute pancreatitis EXAM: MRI ABDOMEN WITHOUT AND WITH CONTRAST (INCLUDING MRCP) TECHNIQUE: Multiplanar multisequence MR imaging of the abdomen was performed both before and after the administration of intravenous contrast. Heavily T2-weighted images of the biliary and pancreatic ducts were obtained, and three-dimensional MRCP images were rendered by post processing. CONTRAST:  9mL GADAVIST GADOBUTROL 1 MMOL/ML IV SOLN COMPARISON:  04/21/2023 CT scan FINDINGS: Lower chest: Trace left pleural effusion with subsegmental atelectasis in the posterior basal segments of both lower lobes. Loop recorder noted. Hepatobiliary: Cholecystectomy. Common bile duct caliber 7 mm, roughly similar to 5/20 8/19, mild prominence may be a physiologic response to cholecystectomy. No  significant focal lesion. Pancreas: No dorsal pancreatic duct dilatation. Mildly accentuated T2 signal in the pancreatic head and uncinate process suspicious for focal pancreatitis. No obvious abnormal hypoenhancing mass in this vicinity on the arterial phase with images to suggest neoplasm. No findings of pancreatic necrosis, abscess, or pseudocyst. 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 Vascular/Lymphatic:  Unremarkable Other:  No supplemental non-categorized findings. Musculoskeletal: Mild levoconvex lumbar scoliosis. Disc desiccation at L4-5 and L5-S1 with loss of disc height. IMPRESSION: 1. Mildly accentuated T2 signal in the pancreatic head and uncinate process suspicious for mild focal pancreatitis. No findings of pancreatic necrosis, abscess, or pseudocyst. 2. Trace left pleural effusion with subsegmental atelectasis in the posterior basal segments of both lower lobes. 3. Cholecystectomy. Common bile duct caliber 7 mm, roughly similar to 5/20 8/19, mild prominence may be a physiologic response to cholecystectomy. 4. Mild levoconvex lumbar scoliosis. Disc desiccation at L4-5 and L5-S1 with loss of disc height. Electronically Signed   By: Gaylyn Rong M.D.   On: 04/23/2023 08:15    Scheduled Meds:  enoxaparin (LOVENOX) injection  40 mg Subcutaneous Q24H   sodium chloride flush  3 mL Intravenous Q12H   Continuous Infusions:  lactated ringers     promethazine (PHENERGAN) injection (IM or IVPB) 25 mg (04/23/23 1105)     LOS: 1 day   Time spent: 40 minutes  Carollee Herter, DO  [facility]  04/23/2023, 1:42 PM

## 2023-04-23 NOTE — Assessment & Plan Note (Signed)
04-23-2023 unclear the cause. MRCP negative for stone. May be due to [facility]. Change IVF to LR due to poor po intake. Changed po pain meds to percocet 10 mg q4h prn. I asked pt not to use IV dilaudid unless severe/breakthrough pain.  04-24-2023 pain control has improved with increase in oxycodone to 10 mg. Sparing use of IV dilaudid. GI agrees that pt does not need any acute intervention. They will schedule her for EUS in the future as outpatient. Lipase continues to be normal.  Continue with clear liquids. Have stopped IVF due to national shortage of IVF bags. [place] also in state of shortage. No indication for continued IVF therapy as pt can tolerate clear liquids. Lipase continues to be absolutely normal. Stay off of GLP-1 at discharge.  04-25-2023 remains off GLP-1. F/u with GI as outpatient for EUS.

## 2023-04-23 NOTE — Assessment & Plan Note (Signed)
04-23-23 stable. 04-24-2023 stable. 04-25-2023 stable. GI wants pt to stay off statins for now.

## 2023-04-23 NOTE — Hospital Course (Signed)
HPI: [name] [name] is a 42 y.o. female with medical history significant of hyperlipidemia, PCOS, bariatric surgery, pancreatitis. The pain started 3 days ago after consuming saltine crackers with peanut butter. Initial discomfort was noted at the top of the stomach and radiated to her back.  On the following morning the pain had intensified.  She thereafter developed nausea.   She has not had any fevers.  She had been seen at [facility] yesterday where CT scan confirmed pancreatitis diagnosis. Patient initially opted for outpatient management and was sent home with morphine and nausea medicine.  However, after getting home patient reports pain was not well-controlled with the morphine that was prescribed.  Noted associated symptoms of nausea and vomiting.  Emesis was noted to be nonbloody in appearance.  However, due to her pain being uncontrolled and persistent vomiting return back to the emergency department.  Denies any history of alcohol use and has not had any recent medication changes.  Patient states that she has been on [facility] for 3 years now and has not had any issues.  She had cholecystectomy several years ago.   Patient reports a prior episode of pancreatitis in 2019 of unknown etiology.  She had been evaluated by [facility] during her prior hospitalization and was recommended to have an MRCP possibly and/or EUS at that time if pain persistent.  She was post to follow up in outpatient setting following her hospitalization, but lost to follow-up   In the emergency department patient was noted to be afebrile with pulse 56-86, blood pressure as low as 92/49, and all other vital signs maintained.  Labs since 12/15 noted WBC 10.4-> 12.3, sodium 134->138, calcium 8.6-> 8.4, and lipase 40->26.  CT scan of the abdomen pelvis noted subtle peripancreatic inflammation involving the head and uncinate process of the pancreas concerning for acute pancreatitis without complicating  features.  Significant Events: Admitted 04/21/2023 for acute pancreatitis   Significant Labs: Labs since 12/15 noted WBC 10.4-> 12.3, sodium 134->138, calcium 8.6-> 8.4, and lipase 40->26   Significant Imaging Studies: Labs since 12/15 noted WBC 10.4-> 12.3, sodium 134->138, calcium 8.6-> 8.4, and lipase 40->26   Antibiotic Therapy: Anti-infectives (From admission, onward)    None       Procedures:   Consultants: GI

## 2023-04-24 DIAGNOSIS — F419 Anxiety disorder, unspecified: Secondary | ICD-10-CM | POA: Diagnosis not present

## 2023-04-24 DIAGNOSIS — I9589 Other hypotension: Secondary | ICD-10-CM | POA: Diagnosis not present

## 2023-04-24 DIAGNOSIS — I10 Essential (primary) hypertension: Secondary | ICD-10-CM | POA: Diagnosis not present

## 2023-04-24 DIAGNOSIS — E663 Overweight: Secondary | ICD-10-CM | POA: Diagnosis not present

## 2023-04-24 DIAGNOSIS — K859 Acute pancreatitis without necrosis or infection, unspecified: Secondary | ICD-10-CM | POA: Diagnosis not present

## 2023-04-24 DIAGNOSIS — K8500 Idiopathic acute pancreatitis without necrosis or infection: Secondary | ICD-10-CM | POA: Diagnosis not present

## 2023-04-24 DIAGNOSIS — E785 Hyperlipidemia, unspecified: Secondary | ICD-10-CM | POA: Diagnosis not present

## 2023-04-24 DIAGNOSIS — E119 Type 2 diabetes mellitus without complications: Secondary | ICD-10-CM | POA: Diagnosis not present

## 2023-04-24 LAB — BASIC METABOLIC PANEL
Anion gap: 5 (ref 5–15)
BUN: 6 mg/dL (ref 6–20)
CO2: 23 mmol/L (ref 22–32)
Calcium: 8 mg/dL — ABNORMAL LOW (ref 8.9–10.3)
Chloride: 110 mmol/L (ref 98–111)
Creatinine, Ser: 0.81 mg/dL (ref 0.44–1.00)
GFR, Estimated: >60 mL/min (ref >60)
Glucose, Bld: 66 mg/dL — ABNORMAL LOW (ref 70–99)
Potassium: 3.8 mmol/L (ref 3.5–5.1)
Sodium: 138 mmol/L (ref 135–145)

## 2023-04-24 LAB — CBC WITH DIFFERENTIAL/PLATELET
Abs Immature Granulocytes: 0.02 10*3/uL (ref 0.00–0.07)
Basophils Absolute: 0 10*3/uL (ref 0.0–0.1)
Basophils Relative: 1 %
Eosinophils Absolute: 0.3 10*3/uL (ref 0.0–0.5)
Eosinophils Relative: 5 %
HCT: 31.9 % — ABNORMAL LOW (ref 36.0–46.0)
Hemoglobin: 11 g/dL — ABNORMAL LOW (ref 12.0–15.0)
Immature Granulocytes: 0 %
Lymphocytes Relative: 34 %
Lymphs Abs: 1.9 10*3/uL (ref 0.7–4.0)
MCH: 29.7 pg (ref 26.0–34.0)
MCHC: 34.5 g/dL (ref 30.0–36.0)
MCV: 86.2 fL (ref 80.0–100.0)
Monocytes Absolute: 0.5 10*3/uL (ref 0.1–1.0)
Monocytes Relative: 8 %
Neutro Abs: 3 10*3/uL (ref 1.7–7.7)
Neutrophils Relative %: 52 %
Platelets: 182 10*3/uL (ref 150–400)
RBC: 3.7 MIL/uL — ABNORMAL LOW (ref 3.87–5.11)
RDW: 12.1 % (ref 11.5–15.5)
WBC: 5.7 10*3/uL (ref 4.0–10.5)
nRBC: 0 % (ref 0.0–0.2)

## 2023-04-24 LAB — LIPASE, BLOOD: Lipase: 24 U/L (ref 11–51)

## 2023-04-24 NOTE — Progress Notes (Signed)
Progress Note  Primary GI: Dr. Chales Abrahams ( 2019 hospital only) DOA: 04/21/2023         Hospital Day: 4   Subjective  Chief Complaint: Recurrent pancreatitis   No family was present at the time of my evaluation. Patient states overall nausea and discomfort have improved she is trying to put further time between pain medications last time was at 230 and she had another dose around 10.  She is walking around no shortness of breath or chest pain.  She has been on clear liquid diet and is requesting full liquids and potentially crackers.  Last bowel movement several days ago some iron her left lower quadrant pain.  She is passing gas.    Objective   Vital signs in last 24 hours: Temp:  [97.7 F (36.5 C)-98.3 F (36.8 C)] 97.7 F (36.5 C) (12/18 0807) Pulse Rate:  [56-70] 70 (12/18 0807) Resp:  [16-18] 16 (12/18 0807) BP: (85-99)/(51-61) 99/60 (12/18 0807) SpO2:  [97 %-99 %] 99 % (12/18 0807) Last BM Date : 04/20/23 Last BM recorded by nurses in past 5 days No data recorded  General:   female in no acute distress  Heart:  Regular rate and rhythm; no murmurs Pulm: Clear anteriorly; no wheezing Abdomen:  Soft, Obese AB, Sluggish bowel sounds. mild tenderness in the epigastrium and in the LLQ with some fullness Without guarding and Without rebound, No organomegaly appreciated. Extremities:  without  edema. Neurologic:  Alert and  oriented x4;  No focal deficits.  Psych:  Cooperative. Normal mood and affect.  Intake/Output from previous day: 12/17 0701 - 12/18 0700 In: 50 [IV Piggyback:50] Out: -  Intake/Output this shift: Total I/O In: 1019 [I.V.:1019] Out: -   Studies/Results: MR 3D Recon At Scanner Result Date: 04/24/2023 CLINICAL DATA:  Nonspecific (abnormal) findings on radiological and other examination of musculoskeletal system EXAM: 3-DIMENSIONAL MR IMAGE RENDERING ON ACQUISITION WORKSTATION TECHNIQUE: 3-dimensional MR images were rendered by post-processing of the  original MR data on an acquisition workstation. The 3-dimensional MR images were interpreted and findings were reported in the accompanying complete MR report for this study. Post-processing was applied at the acquisition scanner with concurrent physician supervision which includes 3D reconstructions, MIPs, volume rendered images and/or shaded surface rendering. Electronically Signed   By: Gaylyn Rong M.D.   On: 04/24/2023 11:12   MR ABDOMEN MRCP W WO CONTAST Result Date: 04/23/2023 CLINICAL DATA:  Acute pancreatitis EXAM: MRI ABDOMEN WITHOUT AND WITH CONTRAST (INCLUDING MRCP) TECHNIQUE: Multiplanar multisequence MR imaging of the abdomen was performed both before and after the administration of intravenous contrast. Heavily T2-weighted images of the biliary and pancreatic ducts were obtained, and three-dimensional MRCP images were rendered by post processing. CONTRAST:  9mL GADAVIST GADOBUTROL 1 MMOL/ML IV SOLN COMPARISON:  04/21/2023 CT scan FINDINGS: Lower chest: Trace left pleural effusion with subsegmental atelectasis in the posterior basal segments of both lower lobes. Loop recorder noted. Hepatobiliary: Cholecystectomy. Common bile duct caliber 7 mm, roughly similar to 5/20 8/19, mild prominence may be a physiologic response to cholecystectomy. No significant focal lesion. Pancreas: No dorsal pancreatic duct dilatation. Mildly accentuated T2 signal in the pancreatic head and uncinate process suspicious for focal pancreatitis. No obvious abnormal hypoenhancing mass in this vicinity on the arterial phase with images to suggest neoplasm. No findings of pancreatic necrosis, abscess, or pseudocyst. 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 Vascular/Lymphatic:  Unremarkable Other:  No supplemental non-categorized findings. Musculoskeletal: Mild levoconvex  lumbar scoliosis. Disc desiccation at L4-5 and L5-S1 with loss of disc  height. IMPRESSION: 1. Mildly accentuated T2 signal in the pancreatic head and uncinate process suspicious for mild focal pancreatitis. No findings of pancreatic necrosis, abscess, or pseudocyst. 2. Trace left pleural effusion with subsegmental atelectasis in the posterior basal segments of both lower lobes. 3. Cholecystectomy. Common bile duct caliber 7 mm, roughly similar to 5/20 8/19, mild prominence may be a physiologic response to cholecystectomy. 4. Mild levoconvex lumbar scoliosis. Disc desiccation at L4-5 and L5-S1 with loss of disc height. Electronically Signed   By: Gaylyn Rong M.D.   On: 04/23/2023 08:15    Lab Results: Recent Labs    04/21/23 2129 04/23/23 0551 04/24/23 0609  WBC 12.3* 6.1 5.7  HGB 13.3 10.8* 11.0*  HCT 38.2 31.4* 31.9*  PLT 232 163 182   BMET Recent Labs    04/22/23 0118 04/23/23 0551 04/24/23 0609  NA 138 137 138  K 3.9 3.7 3.8  CL 108 107 110  CO2 24 21* 23  GLUCOSE 81 71 66*  BUN 12 10 6   CREATININE 0.95 0.66 0.81  CALCIUM 8.4* 8.2* 8.0*   LFT Recent Labs    04/23/23 0551  PROT 5.3*  ALBUMIN 2.7*  AST 11*  ALT 10  ALKPHOS 45  BILITOT 1.0   PT/INR No results for input(s): "LABPROT", "INR" in the last 72 hours.   Scheduled Meds:  enoxaparin (LOVENOX) injection  40 mg Subcutaneous Q24H   sodium chloride flush  3 mL Intravenous Q12H   Continuous Infusions:  promethazine (PHENERGAN) injection (IM or IVPB) 25 mg (04/23/23 2027)      Patient profile:   42 year old white female with acute mild pancreatitis, presenting with 2-day history of progressive upper abdominal pain.  She has had a normal lipase but on CT scan and on MRI has subtle pancreatic head inflammatory changes.  2019 pancreatitis briefly hospitalized negative workup for autoimmune, labeled idiopathic.   Impression/Plan:   Recurrent pancreatitis, second episode   Status post cholecystectomy 2010 Stopped Crestor a month ago, Greggory Keen has been on for 3 years MRI  MRCP without pancreatic divisum or any other chronic pancreatitis or ductal abnormalities Liver function normal, lipase 24, no leukocytosis, normal triglycerides 04/24/2023 LIPASE 24,  WBC 5.7 HGB 11.0 BUN 6 Cr 0.81  04/23/2023 AST 11 ALT 10 Alkphos 45 TBili 1.0  Appears to be improving -Suggesting off GLP-1 inhibitors  -Will advance to full liquid diet and if she tolerates that can advance to low-fat diet -Continue pushing fluids -Continue supportive care with pain control,early ambulation.  -Trend CBC, CMP, lipase. -VTE prophylaxis. -will plan on an EUS 1 to 2 months outpatient possibly in February with Dr. Meridee Score, our office will set up. [facility] will sign off.  Please contact us if we can be of any further assistance during this hospital stay.  Constipation with left lower quadrant abdominal discomfort Suggest starting on the Amitiza which is as needed, patient has not received any. Continue walking and up and out of bed  Type 2 diabetes  Status post gastric sleeve 2018 with significant weight loss  Principal Problem:   Acute pancreatitis Active Problems:   Type 2 diabetes mellitus without complication, without long-term current use of insulin (HCC)   Anxiety   HLD (hyperlipidemia)   S/P bariatric surgery   Overweight (BMI 25.0-29.9)   Chronic hypotension - Previously intolerant of Florinef with nausea, did not tolerate midodrine.    LOS: 2 days  Doree Albee  04/24/2023, 12:27 PM

## 2023-04-24 NOTE — Progress Notes (Signed)
PROGRESS NOTE    [name]  WFU:[id number] DOB: [birth year] DOA: 04/21/2023 PCP: Carlean Jews, NP  Subjective: Pt seen and examined. Stable overnight. Some nausea depending on the flavors of [place] ice. Ask pt to stick to watered down apple juice, gatorade and water.  Labs show no signs of dehydration. Explained to patient that nationally, we are still in state of shortage with IVF. If she is able to tolerate liquids, then IVF will be stopped.  Pt states she is no longer having flank pain that radiates into her back. Only epigastric pain now.   Hospital Course: HPI: [name] [name] is a 42 y.o. female with medical history significant of hyperlipidemia, PCOS, bariatric surgery, pancreatitis. The pain started 3 days ago after consuming saltine crackers with peanut butter. Initial discomfort was noted at the top of the stomach and radiated to her back.  On the following morning the pain had intensified.  She thereafter developed nausea.   She has not had any fevers.  She had been seen at [facility] yesterday where CT scan confirmed pancreatitis diagnosis. Patient initially opted for outpatient management and was sent home with morphine and nausea medicine.  However, after getting home patient reports pain was not well-controlled with the morphine that was prescribed.  Noted associated symptoms of nausea and vomiting.  Emesis was noted to be nonbloody in appearance.  However, due to her pain being uncontrolled and persistent vomiting return back to the emergency department.  Denies any history of alcohol use and has not had any recent medication changes.  Patient states that she has been on [facility] for 3 years now and has not had any issues.  She had cholecystectomy several years ago.   Patient reports a prior episode of pancreatitis in 2019 of unknown etiology.  She had been evaluated by [facility] during her prior hospitalization and was recommended to have an MRCP possibly  and/or EUS at that time if pain persistent.  She was post to follow up in outpatient setting following her hospitalization, but lost to follow-up   In the emergency department patient was noted to be afebrile with pulse 56-86, blood pressure as low as 92/49, and all other vital signs maintained.  Labs since 12/15 noted WBC 10.4-> 12.3, sodium 134->138, calcium 8.6-> 8.4, and lipase 40->26.  CT scan of the abdomen pelvis noted subtle peripancreatic inflammation involving the head and uncinate process of the pancreas concerning for acute pancreatitis without complicating features.  Significant Events: Admitted 04/21/2023 for acute pancreatitis   Significant Labs: Labs since 12/15 noted WBC 10.4-> 12.3, sodium 134->138, calcium 8.6-> 8.4, and lipase 40->26   Significant Imaging Studies: Labs since 12/15 noted WBC 10.4-> 12.3, sodium 134->138, calcium 8.6-> 8.4, and lipase 40->26   Antibiotic Therapy: Anti-infectives (From admission, onward)    None       Procedures:   Consultants:     Assessment and Plan: * Acute pancreatitis 04-23-2023 unclear the cause. MRCP negative for stone. May be due to [facility]. Change IVF to LR due to poor po intake. Changed po pain meds to percocet 10 mg q4h prn. I asked pt not to use IV dilaudid unless severe/breakthrough pain.  04-24-2023 pain control has improved with increase in oxycodone to 10 mg. Sparing use of IV dilaudid. GI agrees that pt does not need any acute intervention. They will schedule her for EUS in the future as outpatient. Lipase continues to be normal.  Continue with clear liquids. Have stopped IVF  due to national shortage of IVF bags. [place] also in state of shortage. No indication for continued IVF therapy as pt can tolerate clear liquids. Lipase continues to be absolutely normal. Stay off of GLP-1 at discharge.  Chronic hypotension - Previously intolerant of Florinef with nausea, did not tolerate midodrine. 04-23-2023 pt states  she has normal SBP as low as 70s at home and is not symptomatic. Have changed call parameters for SBP. Change IVF to LR.  Per cardiology notes in 09-2021. Pt is intolerant to florinef and midodrine.  04-24-2023 chronic. Pt intolerant to florinef and midodrine.  Overweight (BMI 25.0-29.9) 04-23-2023 BMI 27. Pt has lost about 200 lbs with gastric surgery and GLP-1 medications. 04-24-2023 BMI 27.79  S/P bariatric surgery 04-23-2023 stable. 04-24-2023 stable.  HLD (hyperlipidemia) 04-23-23 stable. 04-24-2023 stable.  Anxiety 04-23-2023 chronic. 04-24-2023 stable.  Type 2 diabetes mellitus without complication, without long-term current use of insulin (HCC) 04-23-2023 stable. 04-24-2023 stable.   DVT prophylaxis: enoxaparin (LOVENOX) injection 40 mg Start: 04/22/23 1100    Code Status: Full Code Family Communication: no family at bedside Disposition Plan: return home Reason for continuing need for hospitalization: remains on clear liquid diet.  Objective: Vitals:   04/23/23 1836 04/23/23 1945 04/24/23 0527 04/24/23 0807  BP: (!) 85/54 (!) 85/51 98/61 99/60   Pulse: 64 64 (!) 56 70  Resp: 18 18 18 16   Temp:   98.3 F (36.8 C) 97.7 F (36.5 C)  TempSrc:   Oral Oral  SpO2: 98% 98% 97% 99%  Weight:      Height:        Intake/Output Summary (Last 24 hours) at 04/24/2023 1026 Last data filed at 04/23/2023 1105 Gross per 24 hour  Intake 50 ml  Output --  Net 50 ml   Filed Weights   04/22/23 0855  Weight: 82.9 kg    Examination:  Physical Exam Vitals and nursing note reviewed.  Constitutional:      General: She is not in acute distress.    Appearance: She is not toxic-appearing or diaphoretic.  HENT:     Head: Normocephalic and atraumatic.     Nose: Nose normal.  Cardiovascular:     Rate and Rhythm: Normal rate and regular rhythm.  Pulmonary:     Effort: Pulmonary effort is normal.     Breath sounds: Normal breath sounds.  Abdominal:     General: Bowel  sounds are normal. There is no distension.     Palpations: Abdomen is soft.     Tenderness: There is abdominal tenderness in the epigastric area.  Musculoskeletal:     Right lower leg: No edema.     Left lower leg: No edema.  Skin:    General: Skin is warm and dry.     Capillary Refill: Capillary refill takes less than 2 seconds.  Neurological:     General: No focal deficit present.     Mental Status: She is alert and oriented to person, place, and time.     Data Reviewed: I have personally reviewed following labs and imaging studies  CBC: Recent Labs  Lab 04/21/23 0130 04/21/23 2129 04/23/23 0551 04/24/23 0609  WBC 10.4 12.3* 6.1 5.7  NEUTROABS 6.4  --   --  3.0  HGB 12.2 13.3 10.8* 11.0*  HCT 34.8* 38.2 31.4* 31.9*  MCV 84.5 84.9 87.5 86.2  PLT 232 232 163 182   Basic Metabolic Panel: Recent Labs  Lab 04/21/23 0130 04/22/23 0118 04/23/23 0551 04/24/23 0609  NA  134* 138 137 138  K 4.0 3.9 3.7 3.8  CL 105 108 107 110  CO2 23 24 21* 23  GLUCOSE 84 81 71 66*  BUN 13 12 10 6   CREATININE 0.93 0.95 0.66 0.81  CALCIUM 8.6* 8.4* 8.2* 8.0*   GFR: Estimated Creatinine Clearance: 102.1 mL/min (by C-G formula based on SCr of 0.81 mg/dL). Liver Function Tests: Recent Labs  Lab 04/21/23 0130 04/22/23 0118 04/23/23 0551  AST 24 15 11*  ALT 14 15 10   ALKPHOS 45 53 45  BILITOT 0.8 0.8 1.0  PROT 6.7 6.1* 5.3*  ALBUMIN 3.5 3.4* 2.7*   Recent Labs  Lab 04/21/23 0130 04/22/23 0118 04/23/23 0549 04/24/23 0609  LIPASE 40 26 24 24    Lipid Profile: Recent Labs    04/22/23 1008  CHOL 136  HDL 46  LDLCALC 83  TRIG 34  CHOLHDL 3.0    Radiology Studies: MR ABDOMEN MRCP W WO CONTAST Result Date: 04/23/2023 CLINICAL DATA:  Acute pancreatitis EXAM: MRI ABDOMEN WITHOUT AND WITH CONTRAST (INCLUDING MRCP) TECHNIQUE: Multiplanar multisequence MR imaging of the abdomen was performed both before and after the administration of intravenous contrast. Heavily T2-weighted  images of the biliary and pancreatic ducts were obtained, and three-dimensional MRCP images were rendered by post processing. CONTRAST:  9mL GADAVIST GADOBUTROL 1 MMOL/ML IV SOLN COMPARISON:  04/21/2023 CT scan FINDINGS: Lower chest: Trace left pleural effusion with subsegmental atelectasis in the posterior basal segments of both lower lobes. Loop recorder noted. Hepatobiliary: Cholecystectomy. Common bile duct caliber 7 mm, roughly similar to 5/20 8/19, mild prominence may be a physiologic response to cholecystectomy. No significant focal lesion. Pancreas: No dorsal pancreatic duct dilatation. Mildly accentuated T2 signal in the pancreatic head and uncinate process suspicious for focal pancreatitis. No obvious abnormal hypoenhancing mass in this vicinity on the arterial phase with images to suggest neoplasm. No findings of pancreatic necrosis, abscess, or pseudocyst. 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 Vascular/Lymphatic:  Unremarkable Other:  No supplemental non-categorized findings. Musculoskeletal: Mild levoconvex lumbar scoliosis. Disc desiccation at L4-5 and L5-S1 with loss of disc height. IMPRESSION: 1. Mildly accentuated T2 signal in the pancreatic head and uncinate process suspicious for mild focal pancreatitis. No findings of pancreatic necrosis, abscess, or pseudocyst. 2. Trace left pleural effusion with subsegmental atelectasis in the posterior basal segments of both lower lobes. 3. Cholecystectomy. Common bile duct caliber 7 mm, roughly similar to 5/20 8/19, mild prominence may be a physiologic response to cholecystectomy. 4. Mild levoconvex lumbar scoliosis. Disc desiccation at L4-5 and L5-S1 with loss of disc height. Electronically Signed   By: Gaylyn Rong M.D.   On: 04/23/2023 08:15    Scheduled Meds:  enoxaparin (LOVENOX) injection  40 mg Subcutaneous Q24H   sodium chloride flush  3 mL Intravenous Q12H    Continuous Infusions:  promethazine (PHENERGAN) injection (IM or IVPB) 25 mg (04/23/23 2027)     LOS: 2 days   Time spent: 40 minutes  Carollee Herter, DO  [facility]  04/24/2023, 10:26 AM

## 2023-04-24 NOTE — Plan of Care (Signed)

## 2023-04-25 ENCOUNTER — Telehealth: Payer: Self-pay

## 2023-04-25 ENCOUNTER — Other Ambulatory Visit (HOSPITAL_COMMUNITY): Payer: Self-pay

## 2023-04-25 DIAGNOSIS — K8500 Idiopathic acute pancreatitis without necrosis or infection: Secondary | ICD-10-CM | POA: Diagnosis not present

## 2023-04-25 DIAGNOSIS — I9589 Other hypotension: Secondary | ICD-10-CM | POA: Diagnosis not present

## 2023-04-25 DIAGNOSIS — Z9884 Bariatric surgery status: Secondary | ICD-10-CM | POA: Diagnosis not present

## 2023-04-25 DIAGNOSIS — E663 Overweight: Secondary | ICD-10-CM | POA: Diagnosis not present

## 2023-04-25 LAB — COMPREHENSIVE METABOLIC PANEL
ALT: 11 U/L (ref 0–44)
AST: 12 U/L — ABNORMAL LOW (ref 15–41)
Albumin: 2.7 g/dL — ABNORMAL LOW (ref 3.5–5.0)
Alkaline Phosphatase: 43 U/L (ref 38–126)
Anion gap: 4 — ABNORMAL LOW (ref 5–15)
BUN: 5 mg/dL — ABNORMAL LOW (ref 6–20)
CO2: 25 mmol/L (ref 22–32)
Calcium: 8.2 mg/dL — ABNORMAL LOW (ref 8.9–10.3)
Chloride: 108 mmol/L (ref 98–111)
Creatinine, Ser: 0.9 mg/dL (ref 0.44–1.00)
GFR, Estimated: >60 mL/min (ref >60)
Glucose, Bld: 77 mg/dL (ref 70–99)
Potassium: 3.7 mmol/L (ref 3.5–5.1)
Sodium: 137 mmol/L (ref 135–145)
Total Bilirubin: 0.6 mg/dL (ref <1.2)
Total Protein: 5.4 g/dL — ABNORMAL LOW (ref 6.5–8.1)

## 2023-04-25 LAB — LIPASE, BLOOD: Lipase: 24 U/L (ref 11–51)

## 2023-04-25 LAB — MAGNESIUM: Magnesium: 1.8 mg/dL (ref 1.7–2.4)

## 2023-04-25 MED ORDER — ONDANSETRON 4 MG PO TBDP
4.0000 mg | ORAL_TABLET | Freq: Three times a day (TID) | ORAL | 0 refills | Status: DC | PRN
  Filled 2023-04-25: qty 30, 10d supply, fill #0

## 2023-04-25 MED ORDER — OXYCODONE-ACETAMINOPHEN 10-325 MG PO TABS
1.0000 | ORAL_TABLET | Freq: Four times a day (QID) | ORAL | 0 refills | Status: AC | PRN
  Filled 2023-04-25: qty 20, 5d supply, fill #0

## 2023-04-25 MED ORDER — ONDANSETRON HCL 4 MG PO TABS
4.0000 mg | ORAL_TABLET | Freq: Three times a day (TID) | ORAL | 0 refills | Status: AC | PRN
  Filled 2023-04-25: qty 30, 10d supply, fill #0

## 2023-04-25 NOTE — Progress Notes (Signed)
Patient discharged to home, AVS reviewed, TOC provided medications. Patient transferred to discharge lounge, family to provide transportation.

## 2023-04-25 NOTE — Telephone Encounter (Signed)
Spoke with pt and she is aware colon needs to be done at a different date. States she will call back later to schedule the appt.

## 2023-04-25 NOTE — Discharge Summary (Signed)
[facility] Physician Discharge Summary   Patient name: [name] [name]  Admit date:     04/21/2023  Discharge date: 04/25/2023  Attending Physician: Clydie Braun [[id number]]  Discharge Physician: Carollee Herter   PCP: Carlean Jews, NP  Admitted From: Home  Disposition:  Home  Recommendations for Outpatient Follow-up:  Follow up with PCP in 1-2 weeks Follow up with GI to have EUS performed as outpatient.  Home Health:No Equipment/Devices: None    Discharge Condition:Stable CODE STATUS:FULL Diet recommendation:  full liquid diet Fluid Restriction: None  Hospital Summary: HPI: [name] [name] is a 42 y.o. female with medical history significant of hyperlipidemia, PCOS, bariatric surgery, pancreatitis. The pain started 3 days ago after consuming saltine crackers with peanut butter. Initial discomfort was noted at the top of the stomach and radiated to her back.  On the following morning the pain had intensified.  She thereafter developed nausea.   She has not had any fevers.  She had been seen at [facility] yesterday where CT scan confirmed pancreatitis diagnosis. Patient initially opted for outpatient management and was sent home with morphine and nausea medicine.  However, after getting home patient reports pain was not well-controlled with the morphine that was prescribed.  Noted associated symptoms of nausea and vomiting.  Emesis was noted to be nonbloody in appearance.  However, due to her pain being uncontrolled and persistent vomiting return back to the emergency department.  Denies any history of alcohol use and has not had any recent medication changes.  Patient states that she has been on [facility] for 3 years now and has not had any issues.  She had cholecystectomy several years ago.   Patient reports a prior episode of pancreatitis in 2019 of unknown etiology.  She had been evaluated by [facility] during her prior hospitalization and was recommended to  have an MRCP possibly and/or EUS at that time if pain persistent.  She was post to follow up in outpatient setting following her hospitalization, but lost to follow-up   In the emergency department patient was noted to be afebrile with pulse 56-86, blood pressure as low as 92/49, and all other vital signs maintained.  Labs since 12/15 noted WBC 10.4-> 12.3, sodium 134->138, calcium 8.6-> 8.4, and lipase 40->26.  CT scan of the abdomen pelvis noted subtle peripancreatic inflammation involving the head and uncinate process of the pancreas concerning for acute pancreatitis without complicating features.  Significant Events: Admitted 04/21/2023 for acute pancreatitis   Significant Labs: Labs since 12/15 noted WBC 10.4-> 12.3, sodium 134->138, calcium 8.6-> 8.4, and lipase 40->26   Significant Imaging Studies: Labs since 12/15 noted WBC 10.4-> 12.3, sodium 134->138, calcium 8.6-> 8.4, and lipase 40->26   Antibiotic Therapy: Anti-infectives (From admission, onward)    None       Procedures:   Consultants: GI   Hospital Course by Problem: * Acute pancreatitis 04-23-2023 unclear the cause. MRCP negative for stone. May be due to [facility]. Change IVF to LR due to poor po intake. Changed po pain meds to percocet 10 mg q4h prn. I asked pt not to use IV dilaudid unless severe/breakthrough pain.  04-24-2023 pain control has improved with increase in oxycodone to 10 mg. Sparing use of IV dilaudid. GI agrees that pt does not need any acute intervention. They will schedule her for EUS in the future as outpatient. Lipase continues to be normal.  Continue with clear liquids. Have stopped IVF due to national shortage of IVF  bags. [place] also in state of shortage. No indication for continued IVF therapy as pt can tolerate clear liquids. Lipase continues to be absolutely normal. Stay off of GLP-1 at discharge.  04-25-2023 remains off GLP-1. F/u with GI as outpatient for EUS.  Chronic hypotension  - Previously intolerant of Florinef with nausea, did not tolerate midodrine. 04-23-2023 pt states she has normal SBP as low as 70s at home and is not symptomatic. Have changed call parameters for SBP. Change IVF to LR.  Per cardiology notes in 09-2021. Pt is intolerant to florinef and midodrine.  04-24-2023 chronic. Pt intolerant to florinef and midodrine. 04-25-2023 chronic. Stable.  Overweight (BMI 25.0-29.9) 04-23-2023 BMI 27. Pt has lost about 200 lbs with gastric surgery and GLP-1 medications. 04-24-2023 BMI 27.79  S/P bariatric surgery 04-23-2023 stable. 04-24-2023 stable. 04-25-2023 stable.  HLD (hyperlipidemia) 04-23-23 stable. 04-24-2023 stable. 04-25-2023 stable. GI wants pt to stay off statins for now.  Anxiety 04-23-2023 chronic. 04-24-2023 stable. 04-25-2023 stable. Request for klonopin at discharge declined. Advised pt I would not combine benzos and opiates at discharge.  Type 2 diabetes mellitus without complication, without long-term current use of insulin (HCC) 04-23-2023 stable. 04-24-2023 stable. 04-25-2023 stable.    Discharge Diagnoses:  Principal Problem:   Acute pancreatitis Active Problems:   Chronic hypotension - Previously intolerant of Florinef with nausea, did not tolerate midodrine.   Type 2 diabetes mellitus without complication, without long-term current use of insulin (HCC)   Anxiety   HLD (hyperlipidemia)   S/P bariatric surgery   Overweight (BMI 25.0-29.9)   Discharge Instructions  Discharge Instructions     Call MD for:  extreme fatigue   Complete by: As directed    Call MD for:  hives   Complete by: As directed    Call MD for:  persistant dizziness or light-headedness   Complete by: As directed    Call MD for:  persistant nausea and vomiting   Complete by: As directed    Call MD for:  temperature >100.4   Complete by: As directed    Diet full liquid   Complete by: As directed    Avoid fatty, greasy food for the next 2  weeks.   Discharge instructions   Complete by: As directed    1. Follow up with your primary care provider in 1-2 weeks following discharge from hospital 2. [facility] should call you for an appointment regarding your endoscopic ultrasound. If you do not hear from them in the next week, you can call the office at [phone] for an appointment.   Increase activity slowly   Complete by: As directed       Allergies as of 04/25/2023       Reactions   Iohexol Hives    Code: HIVES, Desc: *NEED 13 HR PREDISONE AND BENADRYL  BE FOR IV CONTRAST GIVEN* /DR OLSON PT DEVELOPED HIVES AND ROOF OF MOUTH BEGIN "ITCHING"", Onset Date: 09811914   Pyridium [phenazopyridine Hcl] Other (See Comments)   vaginitis symptoms when on it        Medication List     STOP taking these medications    aspirin EC 81 MG tablet   morphine 15 MG tablet Commonly known as: MSIR   Mounjaro 7.5 MG/0.5ML Pen Generic drug: tirzepatide   ondansetron 4 MG disintegrating tablet Commonly known as: ZOFRAN-ODT   rosuvastatin 5 MG tablet Commonly known as: Crestor       TAKE these medications    B-D 3CC LUER-LOK  SYR 25GX5/8" 25G X 5/8" 3 ML Misc Generic drug: SYRINGE-NEEDLE (DISP) 3 ML Use to inject b12 every other week.   BARIATRIC MULTIVITAMINS/IRON PO Take 1 tablet by mouth in the morning and at bedtime.   cyanocobalamin 1000 MCG/ML injection Commonly known as: VITAMIN B12 Inject 1 mL (1,000 mcg total) into the skin every 14 (fourteen) days.   HAIR/SKIN/NAILS PO Take 1 tablet by mouth daily.   levonorgestrel 20 MCG/24HR IUD Commonly known as: MIRENA 1 each by Intrauterine route once.   lubiprostone 24 MCG capsule Commonly known as: AMITIZA Take 1 capsule (24 mcg total) by mouth 2 (two) times daily with a meal. What changed:  when to take this reasons to take this   ondansetron 4 MG tablet Commonly known as: Zofran Take 1 tablet (4 mg total) by mouth every 8 (eight) hours as needed for  nausea or vomiting.   oxyCODONE-acetaminophen 10-325 MG tablet Commonly known as: Percocet Take 1 tablet by mouth every 6 (six) hours as needed for up to 5 days for pain.   PROBIOTIC ADVANCED PO Take 1 capsule by mouth daily.   SUMAtriptan 50 MG tablet Commonly known as: IMITREX Take 1 tablet by mouth  at onset of headache. May repeat in 2 hours if symptoms are persistent   Vitamin D (Ergocalciferol) 1.25 MG (50000 UNIT) Caps capsule Commonly known as: DRISDOL Take 1 capsule (50,000 Units total) by mouth every 7 (seven) days.        Allergies  Allergen Reactions   Iohexol Hives     Code: HIVES, Desc: *NEED 13 HR PREDISONE AND BENADRYL  BE FOR IV CONTRAST GIVEN* /DR OLSON PT DEVELOPED HIVES AND ROOF OF MOUTH BEGIN "ITCHING"", Onset Date: [id number]    Pyridium [Phenazopyridine Hcl] Other (See Comments)    vaginitis symptoms when on it    Discharge Exam: Vitals:   04/25/23 0511 04/25/23 0751  BP: 100/67 108/71  Pulse: (!) 59 72  Resp: 18 18  Temp: 98.3 F (36.8 C) 97.9 F (36.6 C)  SpO2: 98% 99%    Physical Exam Vitals and nursing note reviewed.  Constitutional:      General: She is not in acute distress.    Appearance: She is not toxic-appearing or diaphoretic.  HENT:     Head: Normocephalic and atraumatic.     Nose: Nose normal.  Cardiovascular:     Rate and Rhythm: Normal rate and regular rhythm.  Pulmonary:     Effort: Pulmonary effort is normal.     Breath sounds: Normal breath sounds.  Abdominal:     General: Abdomen is flat. There is no distension.     Palpations: Abdomen is soft.     Tenderness: There is abdominal tenderness in the epigastric area.  Musculoskeletal:     Right lower leg: No edema.     Left lower leg: No edema.  Skin:    General: Skin is warm and dry.     Capillary Refill: Capillary refill takes less than 2 seconds.  Neurological:     General: No focal deficit present.     Mental Status: She is alert and oriented to person, place,  and time.     The results of significant diagnostics from this hospitalization (including imaging, microbiology, ancillary and laboratory) are listed below for reference.    Labs:  Basic Metabolic Panel: Recent Labs  Lab 04/21/23 0130 04/22/23 0118 04/23/23 0551 04/24/23 0609 04/25/23 0656  NA 134* 138 137 138 137  K 4.0 3.9 3.7  3.8 3.7  CL 105 108 107 110 108  CO2 23 24 21* 23 25  GLUCOSE 84 81 71 66* 77  BUN 13 12 10 6  5*  CREATININE 0.93 0.95 0.66 0.81 0.90  CALCIUM 8.6* 8.4* 8.2* 8.0* 8.2*  MG  --   --   --   --  1.8   Liver Function Tests: Recent Labs  Lab 04/21/23 0130 04/22/23 0118 04/23/23 0551 04/25/23 0656  AST 24 15 11* 12*  ALT 14 15 10 11   ALKPHOS 45 53 45 43  BILITOT 0.8 0.8 1.0 0.6  PROT 6.7 6.1* 5.3* 5.4*  ALBUMIN 3.5 3.4* 2.7* 2.7*   Recent Labs  Lab 04/21/23 0130 04/22/23 0118 04/23/23 0549 04/24/23 0609 04/25/23 0656  LIPASE 40 26 24 24 24     CBC: Recent Labs  Lab 04/21/23 0130 04/21/23 2129 04/23/23 0551 04/24/23 0609  WBC 10.4 12.3* 6.1 5.7  NEUTROABS 6.4  --   --  3.0  HGB 12.2 13.3 10.8* 11.0*  HCT 34.8* 38.2 31.4* 31.9*  MCV 84.5 84.9 87.5 86.2  PLT 232 232 163 182    Lipid Profile Recent Labs    04/22/23 1008  CHOL 136  HDL 46  LDLCALC 83  TRIG 34  CHOLHDL 3.0   Urinalysis    Component Value Date/Time   COLORURINE YELLOW 04/22/2023 0345   APPEARANCEUR CLOUDY (A) 04/22/2023 0345   APPEARANCEUR Clear 12/28/2020 1008   LABSPEC 1.025 04/22/2023 0345   PHURINE 5.5 04/22/2023 0345   GLUCOSEU NEGATIVE 04/22/2023 0345   HGBUR NEGATIVE 04/22/2023 0345   BILIRUBINUR NEGATIVE 04/22/2023 0345   BILIRUBINUR Negative 12/28/2020 1008   KETONESUR NEGATIVE 04/22/2023 0345   PROTEINUR NEGATIVE 04/22/2023 0345   UROBILINOGEN 0.2 09/23/2020 1146   UROBILINOGEN 0.2 03/29/2014 1516   NITRITE NEGATIVE 04/22/2023 0345   LEUKOCYTESUR NEGATIVE 04/22/2023 0345   Sepsis Labs Recent Labs  Lab 04/21/23 0130 04/21/23 2129  04/23/23 0551 04/24/23 0609  WBC 10.4 12.3* 6.1 5.7    Procedures/Studies: MR 3D Recon At Scanner Result Date: 04/24/2023 CLINICAL DATA:  Nonspecific (abnormal) findings on radiological and other examination of musculoskeletal system EXAM: 3-DIMENSIONAL MR IMAGE RENDERING ON ACQUISITION WORKSTATION TECHNIQUE: 3-dimensional MR images were rendered by post-processing of the original MR data on an acquisition workstation. The 3-dimensional MR images were interpreted and findings were reported in the accompanying complete MR report for this study. Post-processing was applied at the acquisition scanner with concurrent physician supervision which includes 3D reconstructions, MIPs, volume rendered images and/or shaded surface rendering. Electronically Signed   By: Gaylyn Rong M.D.   On: 04/24/2023 11:12   MR ABDOMEN MRCP W WO CONTAST Result Date: 04/23/2023 CLINICAL DATA:  Acute pancreatitis EXAM: MRI ABDOMEN WITHOUT AND WITH CONTRAST (INCLUDING MRCP) TECHNIQUE: Multiplanar multisequence MR imaging of the abdomen was performed both before and after the administration of intravenous contrast. Heavily T2-weighted images of the biliary and pancreatic ducts were obtained, and three-dimensional MRCP images were rendered by post processing. CONTRAST:  9mL GADAVIST GADOBUTROL 1 MMOL/ML IV SOLN COMPARISON:  04/21/2023 CT scan FINDINGS: Lower chest: Trace left pleural effusion with subsegmental atelectasis in the posterior basal segments of both lower lobes. Loop recorder noted. Hepatobiliary: Cholecystectomy. Common bile duct caliber 7 mm, roughly similar to 5/20 8/19, mild prominence may be a physiologic response to cholecystectomy. No significant focal lesion. Pancreas: No dorsal pancreatic duct dilatation. Mildly accentuated T2 signal in the pancreatic head and uncinate process suspicious for focal pancreatitis. No obvious abnormal hypoenhancing mass  in this vicinity on the arterial phase with images to  suggest neoplasm. No findings of pancreatic necrosis, abscess, or pseudocyst. 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 Vascular/Lymphatic:  Unremarkable Other:  No supplemental non-categorized findings. Musculoskeletal: Mild levoconvex lumbar scoliosis. Disc desiccation at L4-5 and L5-S1 with loss of disc height. IMPRESSION: 1. Mildly accentuated T2 signal in the pancreatic head and uncinate process suspicious for mild focal pancreatitis. No findings of pancreatic necrosis, abscess, or pseudocyst. 2. Trace left pleural effusion with subsegmental atelectasis in the posterior basal segments of both lower lobes. 3. Cholecystectomy. Common bile duct caliber 7 mm, roughly similar to 5/20 8/19, mild prominence may be a physiologic response to cholecystectomy. 4. Mild levoconvex lumbar scoliosis. Disc desiccation at L4-5 and L5-S1 with loss of disc height. Electronically Signed   By: Gaylyn Rong M.D.   On: 04/23/2023 08:15   CT ABDOMEN PELVIS WO CONTRAST Result Date: 04/21/2023 CLINICAL DATA:  Acute nonlocalized abdominal pain EXAM: CT ABDOMEN AND PELVIS WITHOUT CONTRAST TECHNIQUE: Multidetector CT imaging of the abdomen and pelvis was performed following the standard protocol without IV contrast. RADIATION DOSE REDUCTION: This exam was performed according to the departmental dose-optimization program which includes automated exposure control, adjustment of the mA and/or kV according to patient size and/or use of iterative reconstruction technique. COMPARISON:  09/28/2017 FINDINGS: Lower chest: No acute abnormality. Hepatobiliary: No focal liver abnormality is seen. Status post cholecystectomy. No biliary dilatation. Pancreas: There is subtle peripancreatic inflammatory stranding involving the head and uncinate process of the pancreas in keeping with changes of acute pancreatitis. No loculated peripancreatic fluid collections.  Pancreatic duct is not dilated. No parenchymal calcifications are seen. Parenchymal viability is not well assessed on this noncontrast examination. Spleen: Normal in size without focal abnormality. Adrenals/Urinary Tract: Adrenal glands are unremarkable. Kidneys are normal, without renal calculi, focal lesion, or hydronephrosis. Bladder is unremarkable. Stomach/Bowel: Surgical changes of gastric sleeve resection are identified. The stomach, small bowel, and large bowel are otherwise unremarkable. Appendix normal. No free intraperitoneal gas or fluid. Vascular/Lymphatic: No significant vascular findings are present. No enlarged abdominal or pelvic lymph nodes. Reproductive: Intrauterine device in expected position within the endometrial cavity. The pelvic organs are otherwise unremarkable. Other: No abdominal wall hernia or abnormality. No abdominopelvic ascites. Musculoskeletal: No acute bone abnormality. No lytic or blastic bone lesion. IMPRESSION: 1. Subtle peripancreatic inflammatory stranding involving the head and uncinate process of the pancreas in keeping with changes of acute pancreatitis. No loculated peripancreatic fluid collections. Parenchymal viability is not well assessed on this noncontrast examination. Findings appear similar to that noted on prior examination 2. Surgical changes of gastric sleeve resection and cholecystectomy. Electronically Signed   By: Helyn Numbers M.D.   On: 04/21/2023 02:04    Time coordinating discharge: 45 mins  SIGNED:  Carollee Herter, DO [facility] 04/25/23, 9:57 AM

## 2023-04-25 NOTE — Progress Notes (Signed)
PROGRESS NOTE    [name] [name] [facility]  WUJ:[id number] DOB: [birth year] DOA: 04/21/2023 PCP: Carlean Jews, NP  Subjective: Pt seen and examined. Stable overnight.  GI signed off yesterday Pt tolerating full liquids Pt asking for klonopin at discharge. Request declined. Stable for DC today.    Hospital Course: HPI: [name] [name] is a 42 y.o. female with medical history significant of hyperlipidemia, PCOS, bariatric surgery, pancreatitis. The pain started 3 days ago after consuming saltine crackers with peanut butter. Initial discomfort was noted at the top of the stomach and radiated to her back.  On the following morning the pain had intensified.  She thereafter developed nausea.   She has not had any fevers.  She had been seen at [facility] yesterday where CT scan confirmed pancreatitis diagnosis. Patient initially opted for outpatient management and was sent home with morphine and nausea medicine.  However, after getting home patient reports pain was not well-controlled with the morphine that was prescribed.  Noted associated symptoms of nausea and vomiting.  Emesis was noted to be nonbloody in appearance.  However, due to her pain being uncontrolled and persistent vomiting return back to the emergency department.  Denies any history of alcohol use and has not had any recent medication changes.  Patient states that she has been on [facility] for 3 years now and has not had any issues.  She had cholecystectomy several years ago.   Patient reports a prior episode of pancreatitis in 2019 of unknown etiology.  She had been evaluated by [facility] during her prior hospitalization and was recommended to have an MRCP possibly and/or EUS at that time if pain persistent.  She was post to follow up in outpatient setting following her hospitalization, but lost to follow-up   In the emergency department patient was noted to be afebrile with pulse 56-86, blood pressure as low as 92/49, and all  other vital signs maintained.  Labs since 12/15 noted WBC 10.4-> 12.3, sodium 134->138, calcium 8.6-> 8.4, and lipase 40->26.  CT scan of the abdomen pelvis noted subtle peripancreatic inflammation involving the head and uncinate process of the pancreas concerning for acute pancreatitis without complicating features.  Significant Events: Admitted 04/21/2023 for acute pancreatitis   Significant Labs: Labs since 12/15 noted WBC 10.4-> 12.3, sodium 134->138, calcium 8.6-> 8.4, and lipase 40->26   Significant Imaging Studies: Labs since 12/15 noted WBC 10.4-> 12.3, sodium 134->138, calcium 8.6-> 8.4, and lipase 40->26   Antibiotic Therapy: Anti-infectives (From admission, onward)    None       Procedures:   Consultants:     Assessment and Plan: * Acute pancreatitis 04-23-2023 unclear the cause. MRCP negative for stone. May be due to [facility]. Change IVF to LR due to poor po intake. Changed po pain meds to percocet 10 mg q4h prn. I asked pt not to use IV dilaudid unless severe/breakthrough pain.  04-24-2023 pain control has improved with increase in oxycodone to 10 mg. Sparing use of IV dilaudid. GI agrees that pt does not need any acute intervention. They will schedule her for EUS in the future as outpatient. Lipase continues to be normal.  Continue with clear liquids. Have stopped IVF due to national shortage of IVF bags. [place] also in state of shortage. No indication for continued IVF therapy as pt can tolerate clear liquids. Lipase continues to be absolutely normal. Stay off of GLP-1 at discharge.  04-25-2023 remains off GLP-1. F/u with GI as outpatient for EUS.  Chronic hypotension - Previously intolerant of Florinef with nausea, did not tolerate midodrine. 04-23-2023 pt states she has normal SBP as low as 70s at home and is not symptomatic. Have changed call parameters for SBP. Change IVF to LR.  Per cardiology notes in 09-2021. Pt is intolerant to florinef and  midodrine.  04-24-2023 chronic. Pt intolerant to florinef and midodrine. 04-25-2023 chronic. Stable.  Overweight (BMI 25.0-29.9) 04-23-2023 BMI 27. Pt has lost about 200 lbs with gastric surgery and GLP-1 medications. 04-24-2023 BMI 27.79  S/P bariatric surgery 04-23-2023 stable. 04-24-2023 stable. 04-25-2023 stable.  HLD (hyperlipidemia) 04-23-23 stable. 04-24-2023 stable. 04-25-2023 stable. GI wants pt to stay off statins for now.  Anxiety 04-23-2023 chronic. 04-24-2023 stable. 04-25-2023 stable. Request for klonopin at discharge declined. Advised pt I would not combine benzos and opiates at discharge.  Type 2 diabetes mellitus without complication, without long-term current use of insulin (HCC) 04-23-2023 stable. 04-24-2023 stable. 04-25-2023 stable.   DVT prophylaxis: enoxaparin (LOVENOX) injection 40 mg Start: 04/22/23 1100    Code Status: Full Code Family Communication: no family at bedside Disposition Plan: return home Reason for continuing need for hospitalization: medically stable for DC  Objective: Vitals:   04/24/23 1610 04/24/23 1933 04/25/23 0511 04/25/23 0751  BP: (!) 93/57 94/63 100/67 108/71  Pulse: 73 73 (!) 59 72  Resp: 16 19 18 18   Temp: 98.4 F (36.9 C) 98.9 F (37.2 C) 98.3 F (36.8 C) 97.9 F (36.6 C)  TempSrc: Oral Oral Oral Oral  SpO2: 97% 99% 98% 99%  Weight:      Height:        Intake/Output Summary (Last 24 hours) at 04/25/2023 0941 Last data filed at 04/24/2023 1355 Gross per 24 hour  Intake 1119 ml  Output --  Net 1119 ml   Filed Weights   04/22/23 0855  Weight: 82.9 kg    Examination:  Physical Exam Vitals and nursing note reviewed.  Constitutional:      General: She is not in acute distress.    Appearance: She is not toxic-appearing or diaphoretic.  HENT:     Head: Normocephalic and atraumatic.     Nose: Nose normal.  Cardiovascular:     Rate and Rhythm: Normal rate and regular rhythm.  Pulmonary:      Effort: Pulmonary effort is normal.     Breath sounds: Normal breath sounds.  Abdominal:     General: Abdomen is flat. There is no distension.     Palpations: Abdomen is soft.     Tenderness: There is abdominal tenderness in the epigastric area.  Musculoskeletal:     Right lower leg: No edema.     Left lower leg: No edema.  Skin:    General: Skin is warm and dry.     Capillary Refill: Capillary refill takes less than 2 seconds.  Neurological:     General: No focal deficit present.     Mental Status: She is alert and oriented to person, place, and time.     Data Reviewed: I have personally reviewed following labs and imaging studies  CBC: Recent Labs  Lab 04/21/23 0130 04/21/23 2129 04/23/23 0551 04/24/23 0609  WBC 10.4 12.3* 6.1 5.7  NEUTROABS 6.4  --   --  3.0  HGB 12.2 13.3 10.8* 11.0*  HCT 34.8* 38.2 31.4* 31.9*  MCV 84.5 84.9 87.5 86.2  PLT 232 232 163 182   Basic Metabolic Panel: Recent Labs  Lab 04/21/23 0130 04/22/23 0118 04/23/23 0551 04/24/23 0609 04/25/23  0656  NA 134* 138 137 138 137  K 4.0 3.9 3.7 3.8 3.7  CL 105 108 107 110 108  CO2 23 24 21* 23 25  GLUCOSE 84 81 71 66* 77  BUN 13 12 10 6  5*  CREATININE 0.93 0.95 0.66 0.81 0.90  CALCIUM 8.6* 8.4* 8.2* 8.0* 8.2*  MG  --   --   --   --  1.8   GFR: Estimated Creatinine Clearance: 91.9 mL/min (by C-G formula based on SCr of 0.9 mg/dL). Liver Function Tests: Recent Labs  Lab 04/21/23 0130 04/22/23 0118 04/23/23 0551 04/25/23 0656  AST 24 15 11* 12*  ALT 14 15 10 11   ALKPHOS 45 53 45 43  BILITOT 0.8 0.8 1.0 0.6  PROT 6.7 6.1* 5.3* 5.4*  ALBUMIN 3.5 3.4* 2.7* 2.7*   Recent Labs  Lab 04/21/23 0130 04/22/23 0118 04/23/23 0549 04/24/23 0609 04/25/23 0656  LIPASE 40 26 24 24 24    Lipid Profile: Recent Labs    04/22/23 1008  CHOL 136  HDL 46  LDLCALC 83  TRIG 34  CHOLHDL 3.0    Radiology Studies: No results found.  Scheduled Meds:  enoxaparin (LOVENOX) injection  40 mg  Subcutaneous Q24H   sodium chloride flush  3 mL Intravenous Q12H   Continuous Infusions:  promethazine (PHENERGAN) injection (IM or IVPB) Stopped (04/24/23 2300)     LOS: 3 days   Time spent: 40 minutes  Carollee Herter, DO  [facility]  04/25/2023, 9:41 AM

## 2023-04-25 NOTE — Telephone Encounter (Signed)
-----   Message from Carie Caddy Pyrtle sent at 04/25/2023 10:22 AM EST ----- Regarding: FW: Please let pt know colonoscopy for hx of polyps can NOT be done with her EUS The colonoscopy can be scheduled with me in the [facility] via previsit. Routine JMP ----- Message ----- From: Lemar Lofty., MD Sent: 04/25/2023   8:07 AM EST To: Beverley Fiedler, MD Subject: RE:                                            JMP, Separate procedures would be best. Thea Silversmith ----- Message ----- From: Beverley Fiedler, MD Sent: 04/24/2023   3:46 PM EST To: Lemar Lofty., MD  Pt has hx of polyps at outside gi practice [facility]) and due for recall. She asked if could be done with EUS I am happy to do this separate if it helps with your schedule overload Either way okay with me??

## 2023-04-26 ENCOUNTER — Telehealth: Payer: Self-pay

## 2023-04-26 ENCOUNTER — Other Ambulatory Visit: Payer: Self-pay

## 2023-04-26 DIAGNOSIS — K861 Other chronic pancreatitis: Secondary | ICD-10-CM

## 2023-04-26 NOTE — Transitions of Care (Post Inpatient/ED Visit) (Unsigned)
04/26/2023  Name: [name] [name] MRN: [id number] DOB: [birth year]  Today's TOC FU Call Status: Today's TOC FU Call Status:: Successful TOC FU Call Completed TOC FU Call Complete Date: 04/26/23 Patient's Name and Date of Birth confirmed.  Transition Care Management Follow-up Telephone Call Date of Discharge: 04/25/23 Discharge Facility: [facility]) Type of Discharge: Inpatient Admission Primary Inpatient Discharge Diagnosis:: pancreatitis How have you been since you were released from the hospital?: Better Any questions or concerns?: No  Items Reviewed: Did you receive and understand the discharge instructions provided?: Yes Medications obtained,verified, and reconciled?: Yes (Medications Reviewed) Any new allergies since your discharge?: No Dietary orders reviewed?: Yes Do you have support at home?: Yes People in Home: significant other, child(ren), adult  Medications Reviewed Today: Medications Reviewed Today     Reviewed by Karena Addison, LPN (Licensed Practical Nurse) on 04/26/23 at 1042  Med List Status: <None>   Medication Order Taking? Sig Documenting Provider Last Dose Status Informant  Biotin w/ Vitamins C & E (HAIR/SKIN/NAILS PO) [id number] No Take 1 tablet by mouth daily.  [provider] Past Week Active Self, Pharmacy Records           Med Note Lezlie Octave Sep 22, 2020  2:20 PM)    cyanocobalamin (VITAMIN B12) 1000 MCG/ML injection [id number] No Inject 1 mL (1,000 mcg total) into the skin every 14 (fourteen) days. Rayburn, Fanny Bien, PA-C Past Month Active Self, Pharmacy Records  levonorgestrel [facility]) 20 MCG/24HR IUD [id number] No 1 each by Intrauterine route once. [provider] Taking Active Self, Pharmacy Records           Med Note Haze Justin, Lorton C   Tue Dec 24, 2017  2:28 PM)    lubiprostone (AMITIZA) 24 MCG capsule [id number] No Take 1 capsule (24 mcg total) by mouth 2 (two) times daily with a meal.  Patient  taking differently: Take 24 mcg by mouth 2 (two) times daily as needed for constipation.   Carlean Jews, NP Past Month Active Self, Pharmacy Records  Multiple Vitamins-Minerals (BARIATRIC MULTIVITAMINS/IRON PO) [id number] No Take 1 tablet by mouth in the morning and at bedtime. [provider] Past Week Active Self, Pharmacy Records  ondansetron (ZOFRAN) 4 MG tablet [id number]  Take 1 tablet (4 mg total) by mouth every 8 (eight) hours as needed for nausea or vomiting. Carollee Herter, DO  Active   oxyCODONE-acetaminophen (PERCOCET) 10-325 MG tablet [id number]  Take 1 tablet by mouth every 6 (six) hours as needed for up to 5 days for pain. Carollee Herter, DO  Active   Probiotic Product (PROBIOTIC ADVANCED PO) [id number] No Take 1 capsule by mouth daily.  [provider] Past Week Active Self, Pharmacy Records           Med Note Lezlie Octave Sep 22, 2020  2:21 PM)    SUMAtriptan (IMITREX) 50 MG tablet [id number] No Take 1 tablet by mouth  at onset of headache. May repeat in 2 hours if symptoms are persistent Boscia, Kathlynn Grate, NP Taking Active Self, Pharmacy Records           Med Note Sedonia Small Apr 22, 2023  9:41 AM) >30 days since last dose.   SYRINGE-NEEDLE, DISP, 3 ML (B-D 3CC LUER-LOK SYR 25GX5/8") 25G X 5/8" 3 ML MISC [id number] No Use to inject b12 every other week. Rayburn, Fanny Bien, PA-C Taking Active Self, Pharmacy Records  Vitamin D, Ergocalciferol, (  DRISDOL) 1.25 MG (50000 UNIT) CAPS capsule [id number] No Take 1 capsule (50,000 Units total) by mouth every 7 (seven) days. Rayburn, Fanny Bien, PA-C Past Week Active Self, Pharmacy Records            Home Care and Equipment/Supplies: Were Home Health Services Ordered?: NA Any new equipment or medical supplies ordered?: NA  Functional Questionnaire: Do you need assistance with bathing/showering or dressing?: No Do you need assistance with meal preparation?: No Do you need assistance with  eating?: No Do you have difficulty maintaining continence: No Do you need assistance with getting out of bed/getting out of a chair/moving?: No Do you have difficulty managing or taking your medications?: No  Follow up appointments reviewed: PCP Follow-up appointment confirmed?: No (no avai appts, sent message to staff to schedule) MD Provider Line Number:[phone] Given: No Specialist Hospital Follow-up appointment confirmed?: No Reason Specialist Follow-Up Not Confirmed: Patient has Specialist Provider Number and will Call for Appointment Do you need transportation to your follow-up appointment?: No Do you understand care options if your condition(s) worsen?: Yes-patient verbalized understanding    SIGNATURE Karena Addison, LPN [facility] Nurse Health Advisor Direct Dial [phone]

## 2023-04-26 NOTE — Telephone Encounter (Signed)
EUS has been set up for 06/20/23 at 145 pm at [facility] with GM   Left message on machine to call back

## 2023-04-26 NOTE — Telephone Encounter (Signed)
EUS scheduled, pt instructed and medications reviewed.  Patient instructions mailed to home.  Patient to call with any questions or concerns.   The pt has been advised to stop mounjaro 7 days prior to the procedure.  The pt has been advised of the information and verbalized understanding.

## 2023-04-29 ENCOUNTER — Ambulatory Visit (INDEPENDENT_AMBULATORY_CARE_PROVIDER_SITE_OTHER): Payer: No Typology Code available for payment source

## 2023-04-29 DIAGNOSIS — R55 Syncope and collapse: Secondary | ICD-10-CM | POA: Diagnosis not present

## 2023-04-30 LAB — CUP PACEART REMOTE DEVICE CHECK
Date Time Interrogation Session: 20241222230408
Implantable Pulse Generator Implant Date: 20220216

## 2023-05-05 NOTE — Progress Notes (Unsigned)
SUBJECTIVE:  Chief Complaint: Obesity  Interim History: She is up 9lbs since her last visit.  Muscle mass + 0.2 lbs Adipose mass +8.6 lbs Total body water +3.4 lbs [name] [name] is a 42 year old individual with a history of obesity, type 2 diabetes, and gastric bypass surgery. She recently experienced an episode of pancreatitis, which led to hospitalization and cessation of all medications, including her obesity treatment. This is the second episode of pancreatitis, with the first occurring in 2019. The patient reports rapid weight gain of approximately nine pounds in two weeks following the hospitalization, attributing this to a diet of clear and full liquids, including Gatorade, chicken broth, crackers, and pasta, as advised by the hospital. She expresses concern about the lack of protein in her diet and the potential for further weight gain before her scheduled gastrointestinal (GI) procedure on February 13th.  The patient also reports intermittent nausea, for which she has been taking Zofran. She describes discomfort in her stomach, particularly in the morning and late evening. She has been managing her hydration with Gatorade and a zero-calorie soft drink, as she finds water too heavy. Her bowel movements have been inconsistent, with some days of diarrhea.  The patient's medications were stopped during her hospital stay, including Mounjaro for her type 2 diabetes. The patient is eager to resume her medications and diet, expressing frustration with the weight gain and the interruption to her obesity treatment plan. She reports that her GI doctor does not believe the [facility] is related to the pancreatitis, suggesting that it could be the statin she is on. The patient has not been consuming fatty foods, does not drink or smoke, and has been adhering to a vegetarian meal plan.  The patient's recent hospital experience was marked by anxiety attacks, for which she was given either Valium or  Klonopin. She reports having night terrors, which she believes were caused by the pain medication. She also reports feeling overmedicated and disoriented, particularly at night. She expresses a preference for oral pain medication over IV, as the latter makes her feel out of control. She is feeling much better now with the majority of her abdominal pain having resolved.   The patient's history also includes a gastric bypass, and she expresses concern about the potential impact of her current diet and soft drink consumption on her sleeve. She is scheduled for a GI procedure on February 13th to investigate her abdominal symptoms further . She also mentions a future colonoscopy due to a family history of stomach and colon cancer. [name] [name] is here to discuss her progress with her obesity treatment plan. She is on the Category 1 Plan and states she is not following her eating plan approximately 0 % of the time due to episode as noted. She states she is not exercising 0 minutes 0 times per week.   OBJECTIVE: Visit Diagnoses: Problem List Items Addressed This Visit     Generalized obesity   Type 2 diabetes mellitus without complication, without long-term current use of insulin (HCC)   Acute pancreatitis - Primary   Other Visit Diagnoses       Other depression, emotional eating behavior       Relevant Medications   topiramate (TOPAMAX) 25 MG tablet     BMI 28.0-28.9,adult Current BMI 28.3         ? Acute Pancreatitis Recent hospitalization for acute pancreatitis with peripancreatic inflammatory stranding on MRI scan. Similar episode in 2019. Currently on a low-protein, low-complex carb diet. Scheduled for  endoscopy on February 13th to investigate further.  Discussed dietary restrictions and potential need for pancreatic enzyme supplements based on GI recommendations. - Continue current dietary restrictions until the GI procedure - Follow up with GI for endoscopy on February 13th - Consider pancreatic  enzyme supplements if recommended by GI  Obesity 42 year old with obesity, type 2 diabetes, and gastric bypass. Recent 9-pound weight gain due to dietary restrictions post-pancreatitis. Current diet low in protein and complex carbs, causing sluggishness and dissatisfaction.  Discussed topiramate for weight management and cravings, including potential side effects like altered taste of carbonated beverages and teratogenic risks.Patient denies history of glaucoma or kidney stones. She was informed of side effects and that topiramate is teratogenic. She currently uses IUD for birth control.  - Start topiramate 25 mg twice a day for cravings and weight management - Follow a vegetarian meal plan with lean proteins and low-fat options - Avoid high-fat foods and complex carbs - Reassess in four weeks  Type 2 Diabetes Mellitus Type 2 diabetes with recent A1c of 5.2. Currently off Mounjaro due to recent pancreatitis. Concerns about glycemic control and weight gain. Discussed risks of restarting Mounjaro, including potential pancreatitis recurrence. Consider metformin if glycemic control worsens. - Hold off on Mounjaro until after the GI procedure on February 13th - Consider metformin if glycemic control worsens - Monitor blood glucose levels regularly  Anxiety/ Other depression with emotional eating Anxiety and panic attacks exacerbated during recent hospitalization. Previously treated with benzodiazepines, but experienced overmedication. Discussed topiramate may be helpful for anxiety and panic attacks, noting its dual benefit for weight management and anxiety control. - Start topiramate 25 mg twice a day which may also be helpful to decrease anxiety.  - Monitor for side effects and adjust dosage as needed - Avoid benzodiazepines due to risk of overmedication  General Health Maintenance Discussed hydration and bowel regularity. Advised vegetarian meal plan with lean proteins and low-fat options. -  Stay hydrated Okay to resume bariatric vitamins and vitamin D next week if abdominal pain remains resolved.  - Monitor bowel movements and use Amitiza if needed - Follow a vegetarian meal plan with lean proteins and low-fat options  Vitals Temp: 98 F (36.7 C) BP: 94/64 Pulse Rate: 82 SpO2: 100 %   Anthropometric Measurements Height: 5\' 8"  (1.727 m) Weight: 186 lb (84.4 kg) BMI (Calculated): 28.29 Weight at Last Visit: 177 lb Weight Lost Since Last Visit: 0 Weight Gained Since Last Visit: 9 lb Peak Weight: 244 lb   Body Composition  Body Fat %: 38.5 % Fat Mass (lbs): 71.6 lbs Muscle Mass (lbs): 108.8 lbs Total Body Water (lbs): 84.8 lbs Visceral Fat Rating : 7   Other Clinical Data Fasting: no Labs: no Today's Visit #: 35     ASSESSMENT AND PLAN:  Diet: [name] [name] is currently in the action stage of change. As such, her goal is to get back to weight loss efforts. She has agreed to [employer].  Exercise: [name] [name] has been instructed  exercise as able as status improves following recent hospitalization  for weight loss and overall health benefits.   Behavior Modification:  We discussed the following Behavioral Modification Strategies today: increasing lean protein intake, decreasing simple carbohydrates, increasing vegetables, increase H2O intake, no skipping meals, and emotional eating strategies . We discussed various medication options to help [name] [name] with her weight loss efforts and we both agreed to remain off Mounjaro for now and begin topamax for cravings/emotional eating/anxiety.  Return in about 6 weeks (around  06/17/2023).Marland Kitchen She was informed of the importance of frequent follow up visits to maximize her success with intensive lifestyle modifications for her multiple health conditions.  Attestation Statements:   Reviewed by clinician on day of visit: allergies, medications, problem list, medical history, surgical history, family history, social history,  and previous encounter notes.   Time spent on visit including pre-visit chart review and post-visit care and charting was 48 minutes.    Elnore Cosens, PA-C

## 2023-05-06 ENCOUNTER — Other Ambulatory Visit (HOSPITAL_COMMUNITY): Payer: Self-pay

## 2023-05-06 ENCOUNTER — Ambulatory Visit (INDEPENDENT_AMBULATORY_CARE_PROVIDER_SITE_OTHER): Payer: No Typology Code available for payment source | Admitting: Physician Assistant

## 2023-05-06 ENCOUNTER — Encounter (INDEPENDENT_AMBULATORY_CARE_PROVIDER_SITE_OTHER): Payer: Self-pay | Admitting: Physician Assistant

## 2023-05-06 VITALS — BP 94/64 | HR 82 | Temp 98.0°F | Ht 68.0 in | Wt 186.0 lb

## 2023-05-06 DIAGNOSIS — F3289 Other specified depressive episodes: Secondary | ICD-10-CM

## 2023-05-06 DIAGNOSIS — E669 Obesity, unspecified: Secondary | ICD-10-CM

## 2023-05-06 DIAGNOSIS — E559 Vitamin D deficiency, unspecified: Secondary | ICD-10-CM

## 2023-05-06 DIAGNOSIS — K853 Drug induced acute pancreatitis without necrosis or infection: Secondary | ICD-10-CM

## 2023-05-06 DIAGNOSIS — E119 Type 2 diabetes mellitus without complications: Secondary | ICD-10-CM | POA: Diagnosis not present

## 2023-05-06 DIAGNOSIS — R110 Nausea: Secondary | ICD-10-CM

## 2023-05-06 DIAGNOSIS — Z7985 Long-term (current) use of injectable non-insulin antidiabetic drugs: Secondary | ICD-10-CM

## 2023-05-06 DIAGNOSIS — K859 Acute pancreatitis without necrosis or infection, unspecified: Secondary | ICD-10-CM

## 2023-05-06 DIAGNOSIS — E538 Deficiency of other specified B group vitamins: Secondary | ICD-10-CM

## 2023-05-06 DIAGNOSIS — F5089 Other specified eating disorder: Secondary | ICD-10-CM

## 2023-05-06 DIAGNOSIS — Z6828 Body mass index (BMI) 28.0-28.9, adult: Secondary | ICD-10-CM

## 2023-05-06 MED ORDER — TOPIRAMATE 25 MG PO TABS
25.0000 mg | ORAL_TABLET | Freq: Two times a day (BID) | ORAL | 1 refills | Status: DC
  Filled 2023-05-06: qty 60, 30d supply, fill #0

## 2023-05-13 ENCOUNTER — Other Ambulatory Visit (HOSPITAL_COMMUNITY): Payer: Self-pay

## 2023-06-02 ENCOUNTER — Other Ambulatory Visit (INDEPENDENT_AMBULATORY_CARE_PROVIDER_SITE_OTHER): Payer: Self-pay | Admitting: Physician Assistant

## 2023-06-02 DIAGNOSIS — E559 Vitamin D deficiency, unspecified: Secondary | ICD-10-CM

## 2023-06-03 ENCOUNTER — Ambulatory Visit (INDEPENDENT_AMBULATORY_CARE_PROVIDER_SITE_OTHER): Payer: Self-pay

## 2023-06-03 DIAGNOSIS — R55 Syncope and collapse: Secondary | ICD-10-CM

## 2023-06-03 LAB — CUP PACEART REMOTE DEVICE CHECK
Date Time Interrogation Session: 20250126230829
Implantable Pulse Generator Implant Date: 20220216

## 2023-06-10 NOTE — Addendum Note (Signed)
Addended by: Geralyn Flash D on: 06/10/2023 10:44 AM   Modules accepted: Orders

## 2023-06-10 NOTE — Progress Notes (Signed)
 [facility] Summary Report / Loop Recorder

## 2023-06-13 ENCOUNTER — Encounter (HOSPITAL_COMMUNITY): Payer: Self-pay | Admitting: Gastroenterology

## 2023-06-14 NOTE — Progress Notes (Signed)
 Pre op call eval Name:  [name] [name]  PCP-Heather Hanford NP  Cardiologist-Christopher MD  EKG- 06/14/22 Echo-06/22/20 Stress Test-n/a Cath-n/a Blood thinner-n/a GLP-1-n/a- was on Mounjaro  but has stopped   Hx: HTN,DM, IBS, PCOS, Bariatric surgery,Pancreatitis, Loop recorder for syncope/hypotension. Last time saw cardiology was 06/22/22, has a loop recorder for syncope/low bps. Patient said due to see them soon but they do loop recorder checks frequently next one 07/08/23. Per patient stoped Mounjaro  to see if causing pancreatitis. Currently she reports no new cardiac or breathing issues.  Anesthesia Review: Yes

## 2023-06-19 NOTE — Anesthesia Preprocedure Evaluation (Signed)
Anesthesia Evaluation  Patient identified by MRN, date of birth, ID band Patient awake    Reviewed: Allergy & Precautions, NPO status , Patient's Chart, lab work & pertinent test results  History of Anesthesia Complications (+) PONV and history of anesthetic complications  Airway Mallampati: I  TM Distance: >3 FB Neck ROM: Full   Comment: Previous grade I view with MAC 3, easy mask Dental  (+) Dental Advisory Given Back right bottom molar removed this past week:   Pulmonary neg shortness of breath, sleep apnea (no longer requires CPAP after losing weight after gastric sleeve) , neg COPD, neg recent URI, former smoker   Pulmonary exam normal breath sounds clear to auscultation       Cardiovascular hypertension, (-) angina + CAD  (-) Past MI, (-) Cardiac Stents and (-) CABG + dysrhythmias (incomplete RBBB) (-) pacemaker Rhythm:Regular Rate:Normal  HLD, ILR  TTE 06/22/2020: IMPRESSIONS     1. Left ventricular ejection fraction, by estimation, is 60 to 65%. The  left ventricle has normal function. The left ventricle has no regional  wall motion abnormalities. There is mild left ventricular hypertrophy.  Left ventricular diastolic parameters  were normal.   2. Right ventricular systolic function is normal. The right ventricular  size is normal. Tricuspid regurgitation signal is inadequate for assessing  PA pressure.   3. The mitral valve is normal in structure. No evidence of mitral valve  regurgitation.   4. The aortic valve was not well visualized. Aortic valve regurgitation  is not visualized. No aortic stenosis is present.   5. The inferior vena cava is normal in size with greater than 50%  respiratory variability, suggesting right atrial pressure of 3 mmHg.     Neuro/Psych  Headaches, neg Seizures PSYCHIATRIC DISORDERS Anxiety     vertigo    GI/Hepatic Neg liver ROS,GERD  ,,Recurrent pancreatitis, IBS, s/p gastric sleeve  resection   Endo/Other  diabetes    Renal/GU negative Renal ROS     Musculoskeletal   Abdominal   Peds  Hematology  (+) Blood dyscrasia, anemia Lab Results      Component                Value               Date                      WBC                      5.7                 04/24/2023                HGB                      11.0 (L)            04/24/2023                HCT                      31.9 (L)            04/24/2023                MCV                      86.2  04/24/2023                PLT                      182                 04/24/2023              Anesthesia Other Findings   Reproductive/Obstetrics Endometriosis, PCOS                             Anesthesia Physical Anesthesia Plan  ASA: 3  Anesthesia Plan: MAC   Post-op Pain Management:    Induction: Intravenous  PONV Risk Score and Plan: 3 and Propofol infusion, Treatment may vary due to age or medical condition and TIVA  Airway Management Planned: Natural Airway and Simple Face Mask  Additional Equipment:   Intra-op Plan:   Post-operative Plan:   Informed Consent: I have reviewed the patients History and Physical, chart, labs and discussed the procedure including the risks, benefits and alternatives for the proposed anesthesia with the patient or authorized representative who has indicated his/her understanding and acceptance.     Dental advisory given  Plan Discussed with: CRNA and Anesthesiologist  Anesthesia Plan Comments: (Discussed with patient risks of MAC including, but not limited to, minor pain or discomfort, hearing people in the room, and possible need for backup general anesthesia. Risks for general anesthesia also discussed including, but not limited to, sore throat, hoarse voice, chipped/damaged teeth, injury to vocal cords, nausea and vomiting, allergic reactions, lung infection, heart attack, stroke, and death. All questions answered. )        Anesthesia Quick Evaluation

## 2023-06-20 ENCOUNTER — Encounter (INDEPENDENT_AMBULATORY_CARE_PROVIDER_SITE_OTHER): Payer: Self-pay | Admitting: Physician Assistant

## 2023-06-20 ENCOUNTER — Ambulatory Visit (HOSPITAL_COMMUNITY): Payer: No Typology Code available for payment source | Admitting: Anesthesiology

## 2023-06-20 ENCOUNTER — Encounter (HOSPITAL_COMMUNITY): Payer: Self-pay | Admitting: Gastroenterology

## 2023-06-20 ENCOUNTER — Ambulatory Visit (HOSPITAL_COMMUNITY)
Admission: RE | Admit: 2023-06-20 | Discharge: 2023-06-20 | Disposition: A | Discharge status: Home / Self Care | Payer: No Typology Code available for payment source | Source: Ambulatory Visit | Attending: Gastroenterology | Admitting: Gastroenterology

## 2023-06-20 ENCOUNTER — Other Ambulatory Visit (HOSPITAL_COMMUNITY): Payer: Self-pay

## 2023-06-20 ENCOUNTER — Ambulatory Visit (HOSPITAL_BASED_OUTPATIENT_CLINIC_OR_DEPARTMENT_OTHER): Payer: No Typology Code available for payment source | Admitting: Anesthesiology

## 2023-06-20 ENCOUNTER — Encounter (HOSPITAL_COMMUNITY)
Admission: RE | Disposition: A | Discharge status: Home / Self Care | Payer: Self-pay | Source: Ambulatory Visit | Attending: Gastroenterology

## 2023-06-20 ENCOUNTER — Ambulatory Visit (INDEPENDENT_AMBULATORY_CARE_PROVIDER_SITE_OTHER): Payer: No Typology Code available for payment source | Admitting: Physician Assistant

## 2023-06-20 ENCOUNTER — Other Ambulatory Visit: Payer: Self-pay

## 2023-06-20 VITALS — BP 106/71 | HR 75 | Temp 97.7°F | Ht 68.0 in | Wt 202.0 lb

## 2023-06-20 DIAGNOSIS — I119 Hypertensive heart disease without heart failure: Secondary | ICD-10-CM | POA: Insufficient documentation

## 2023-06-20 DIAGNOSIS — I2510 Atherosclerotic heart disease of native coronary artery without angina pectoris: Secondary | ICD-10-CM

## 2023-06-20 DIAGNOSIS — E559 Vitamin D deficiency, unspecified: Secondary | ICD-10-CM | POA: Diagnosis not present

## 2023-06-20 DIAGNOSIS — E119 Type 2 diabetes mellitus without complications: Secondary | ICD-10-CM | POA: Diagnosis not present

## 2023-06-20 DIAGNOSIS — K209 Esophagitis, unspecified without bleeding: Secondary | ICD-10-CM

## 2023-06-20 DIAGNOSIS — K859 Acute pancreatitis without necrosis or infection, unspecified: Secondary | ICD-10-CM | POA: Diagnosis present

## 2023-06-20 DIAGNOSIS — Z87891 Personal history of nicotine dependence: Secondary | ICD-10-CM | POA: Insufficient documentation

## 2023-06-20 DIAGNOSIS — E669 Obesity, unspecified: Secondary | ICD-10-CM

## 2023-06-20 DIAGNOSIS — K2100 Gastro-esophageal reflux disease with esophagitis, without bleeding: Secondary | ICD-10-CM | POA: Insufficient documentation

## 2023-06-20 DIAGNOSIS — K259 Gastric ulcer, unspecified as acute or chronic, without hemorrhage or perforation: Secondary | ICD-10-CM | POA: Diagnosis not present

## 2023-06-20 DIAGNOSIS — E538 Deficiency of other specified B group vitamins: Secondary | ICD-10-CM

## 2023-06-20 DIAGNOSIS — K2970 Gastritis, unspecified, without bleeding: Secondary | ICD-10-CM

## 2023-06-20 DIAGNOSIS — Z833 Family history of diabetes mellitus: Secondary | ICD-10-CM | POA: Diagnosis not present

## 2023-06-20 DIAGNOSIS — K861 Other chronic pancreatitis: Secondary | ICD-10-CM

## 2023-06-20 DIAGNOSIS — E282 Polycystic ovarian syndrome: Secondary | ICD-10-CM | POA: Diagnosis not present

## 2023-06-20 DIAGNOSIS — Z9884 Bariatric surgery status: Secondary | ICD-10-CM | POA: Insufficient documentation

## 2023-06-20 DIAGNOSIS — Z8249 Family history of ischemic heart disease and other diseases of the circulatory system: Secondary | ICD-10-CM | POA: Diagnosis not present

## 2023-06-20 DIAGNOSIS — K838 Other specified diseases of biliary tract: Secondary | ICD-10-CM

## 2023-06-20 DIAGNOSIS — Z7985 Long-term (current) use of injectable non-insulin antidiabetic drugs: Secondary | ICD-10-CM

## 2023-06-20 DIAGNOSIS — K853 Drug induced acute pancreatitis without necrosis or infection: Secondary | ICD-10-CM

## 2023-06-20 DIAGNOSIS — Z800 Family history of malignant neoplasm of digestive organs: Secondary | ICD-10-CM | POA: Insufficient documentation

## 2023-06-20 DIAGNOSIS — I899 Noninfective disorder of lymphatic vessels and lymph nodes, unspecified: Secondary | ICD-10-CM

## 2023-06-20 DIAGNOSIS — R935 Abnormal findings on diagnostic imaging of other abdominal regions, including retroperitoneum: Secondary | ICD-10-CM | POA: Diagnosis present

## 2023-06-20 DIAGNOSIS — K3189 Other diseases of stomach and duodenum: Secondary | ICD-10-CM | POA: Insufficient documentation

## 2023-06-20 DIAGNOSIS — Z6830 Body mass index (BMI) 30.0-30.9, adult: Secondary | ICD-10-CM

## 2023-06-20 HISTORY — PX: BIOPSY: SHX5522

## 2023-06-20 HISTORY — PX: ESOPHAGOGASTRODUODENOSCOPY (EGD) WITH PROPOFOL: SHX5813

## 2023-06-20 HISTORY — PX: EUS: SHX5427

## 2023-06-20 LAB — PREGNANCY, URINE: Preg Test, Ur: NEGATIVE

## 2023-06-20 SURGERY — ESOPHAGOGASTRODUODENOSCOPY (EGD) WITH PROPOFOL
Anesthesia: Monitor Anesthesia Care

## 2023-06-20 MED ORDER — PROPOFOL 500 MG/50ML IV EMUL
INTRAVENOUS | Status: DC | PRN
Start: 2023-06-20 — End: 2023-06-20
  Administered 2023-06-20: 125 ug/kg/min via INTRAVENOUS

## 2023-06-20 MED ORDER — SODIUM CHLORIDE 0.9 % IV SOLN: INTRAVENOUS | Status: DC

## 2023-06-20 MED ORDER — PROPOFOL 1000 MG/100ML IV EMUL
INTRAVENOUS | Status: AC
  Filled 2023-06-20: qty 100

## 2023-06-20 MED ORDER — ESOMEPRAZOLE MAGNESIUM 40 MG PO CPDR
40.0000 mg | DELAYED_RELEASE_CAPSULE | Freq: Two times a day (BID) | ORAL | 6 refills | Status: AC
  Filled 2023-06-20: qty 60, 30d supply, fill #0

## 2023-06-20 MED ORDER — PROPOFOL 10 MG/ML IV BOLUS
INTRAVENOUS | Status: DC | PRN
  Administered 2023-06-20: 50 mg via INTRAVENOUS
  Administered 2023-06-20: 30 mg via INTRAVENOUS
  Administered 2023-06-20: 20 mg via INTRAVENOUS

## 2023-06-20 MED ORDER — ONDANSETRON HCL 4 MG/2ML IJ SOLN
INTRAMUSCULAR | Status: DC | PRN
  Administered 2023-06-20: 4 mg via INTRAVENOUS

## 2023-06-20 MED ORDER — TIRZEPATIDE 2.5 MG/0.5ML ~~LOC~~ SOAJ
2.5000 mg | SUBCUTANEOUS | 0 refills | Status: DC
  Filled 2023-06-20: qty 2, 28d supply, fill #0

## 2023-06-20 MED ORDER — DEXAMETHASONE SODIUM PHOSPHATE 4 MG/ML IJ SOLN
INTRAMUSCULAR | Status: DC | PRN
  Administered 2023-06-20: 4 mg via INTRAVENOUS

## 2023-06-20 NOTE — Anesthesia Procedure Notes (Signed)
Procedure Name: MAC Date/Time: 06/20/2023 10:45 AM  Performed by: Elyn Peers, CRNAPre-anesthesia Checklist: Patient identified, Emergency Drugs available, Suction available, Patient being monitored and Timeout performed Oxygen Delivery Method: Simple face mask Preoxygenation: POM used. Placement Confirmation: positive ETCO2

## 2023-06-20 NOTE — Discharge Instructions (Signed)

## 2023-06-20 NOTE — H&P (Addendum)
GASTROENTEROLOGY PROCEDURE H&P NOTE   Primary Care Physician: Melida Quitter, PA  HPI: [name] [name] is a 43 y.o. female who presents for EGD/EUS for recurrent pancreatitis rule out microlithiasis.  Past Medical History:  Diagnosis Date   Abnormal uterine bleeding    Amenorrhea    Anemia    Anxiety    panic attacks   Back pain    Bradycardia    Chest pain    COVID 02/2020   Diabetes (HCC)    h/o   Dysmenorrhea    Endometriosis    Gallbladder problem    GERD (gastroesophageal reflux disease)    occasional - diet controlled and tums prn   HLD (hyperlipidemia)    HTN (hypertension)    IBS (irritable bowel syndrome)    Infertility, female    Insulin resistance    Migraine without aura    Missed abortion    x 2 - both resolved without surgery   Obesity    PCOS (polycystic ovarian syndrome)    PONV (postoperative nausea and vomiting)    pe rpatient ; scopalamine patch very helpful    SVD (spontaneous vaginal delivery)    x 1   Syncope and collapse 06/21/2020   Urinary incontinence    Vitamin D deficiency    Past Surgical History:  Procedure Laterality Date   ABDOMINOPLASTY/PANNICULECTOMY WITH LIPOSUCTION N/A 08/24/2021   Procedure: ABDOMINOPLASTY/PANNICULECTOMY WITH LIPOSUCTION, LIPOSUCTION OF MONS PUBIS;  Surgeon: Peggye Form, DO;  Location: [facility];  Service: Plastics;  Laterality: N/A;   CHOLECYSTECTOMY     COLONOSCOPY  2017   polyps   CYSTOSCOPY N/A 10/29/2016   Procedure: CYSTOSCOPY;  Surgeon: Romualdo Bolk, MD;  Location: [facility];  Service: Gynecology;  Laterality: N/A;   DILATATION & CURRETTAGE/HYSTEROSCOPY WITH RESECTOCOPE N/A 10/17/2012   Procedure: DILATATION & CURETTAGE/HYSTEROSCOPY WITH RESECTOCOPE; Possible Polypectomy, Possible Resectoscopic Myomectomy.;  Surgeon: Alphonsus Sias. Ernestina Penna, MD;  Location: [facility];  Service: Gynecology;  Laterality: N/A;  1 hr.   DILATION AND CURETTAGE OF UTERUS     ENDOMETRIAL BIOPSY      ENDOVENOUS ABLATION SAPHENOUS VEIN W/ LASER Right 05/08/2018   endovenous laser ablation right greater saphenous vein and stab phlebectomy > 20 incisions right leg by Waverly Ferrari MD    ESOPHAGOGASTRODUODENOSCOPY ENDOSCOPY  2017   Normal   HYSTEROSCOPY WITH D & C N/A 12/01/2013   Procedure: IUD Removal;  Surgeon: Alphonsus Sias. Ernestina Penna, MD;  Location: [facility];  Service: Gynecology;  Laterality: N/A;  90 min.   INTRAUTERINE DEVICE (IUD) INSERTION N/A 10/17/2012   Procedure: INTRAUTERINE DEVICE (IUD) INSERTION; Mirena;  Surgeon: Alphonsus Sias. Ernestina Penna, MD;  Location: [facility];  Service: Gynecology;  Laterality: N/A;   LAPAROSCOPIC GASTRIC SLEEVE RESECTION N/A 02/19/2017   Procedure: LAPAROSCOPIC GASTRIC SLEEVE RESECTION, UPPER ENDOSCOPY;  Surgeon: Luretha Murphy, MD;  Location: [facility];  Service: General;  Laterality: N/A;   LAPAROSCOPIC SALPINGO OOPHERECTOMY Left 10/29/2016   Procedure: LAPAROSCOPIC SALPINGO OOPHORECTOMY With anterior uterine biopsy;  Surgeon: Romualdo Bolk, MD;  Location: [facility];  Service: Gynecology;  Laterality: Left;   LASIK  10/06/2018   LOOP RECORDER INSERTION N/A 06/22/2020   Procedure: LOOP RECORDER INSERTION;  Surgeon: Marinus Maw, MD;  Location: [facility];  Service: Cardiovascular;  Laterality: N/A;   MOUTH SURGERY     wisdom teeth   No current facility-administered medications for this encounter.   No current facility-administered medications for this encounter. Allergies  Allergen Reactions  Iohexol Hives     Code: HIVES, Desc: *NEED 13 HR PREDISONE AND BENADRYL  BE FOR IV CONTRAST GIVEN* /DR OLSON PT DEVELOPED HIVES AND ROOF OF MOUTH BEGIN "ITCHING"", Onset Date: [id number]    Pyridium [Phenazopyridine Hcl] Other (See Comments)    vaginitis symptoms when on it   Family History  Problem Relation Age of Onset   Heart disease Mother        MVP   Hypertension Mother    Diabetes Mother    Depression Mother    Obesity Mother    Diabetes Father     Thyroid disease Father    Hypertension Father    Parkinson's disease Father    Dementia Father    Hyperlipidemia Father    Heart disease Father    Sleep apnea Father    Obesity Father    Colon cancer Maternal Uncle    Breast cancer Paternal Aunt    Diabetes Maternal Grandmother    Heart disease Maternal Grandmother    Diabetes Maternal Grandfather    Heart disease Maternal Grandfather    Stroke Maternal Grandfather    Diabetes Paternal Grandmother    Stroke Paternal Grandfather    Diabetes Paternal Grandfather    Heart disease Paternal Grandfather    Alcohol abuse Paternal Grandfather    Social History   Socioeconomic History   Marital status: Legally Separated    Spouse name: Chiyoko Torrico   Number of children: 1   Years of education: Not on file   Highest education level: Not on file  Occupational History   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  Other Topics Concern   Not on file  Social History Narrative   Lives alone, going through separation.   Social Drivers of Corporate investment banker Strain: Not on file  Food Insecurity: No Food Insecurity (04/22/2023)   Hunger Vital Sign    Worried About Running Out of Food in the Last Year: Never true    Ran Out of Food in the Last Year: Never true  Transportation Needs: No Transportation Needs (04/22/2023)   PRAPARE - Administrator, Civil Service (Medical): No    Lack of Transportation (Non-Medical): No   Physical Activity: Not on file  Stress: Not on file  Social Connections: Not on file  Intimate Partner Violence: Not At Risk (04/22/2023)   Humiliation, Afraid, Rape, and Kick questionnaire    Fear of Current or Ex-Partner: No    Emotionally Abused: No    Physically Abused: No    Sexually Abused: No    Physical Exam: Today's Vitals   06/13/23 1236  Weight: 91.6 kg   Body mass index is 30.71 kg/m. GEN: NAD EYE: Sclerae anicteric ENT: MMM CV: Non-tachycardic GI: Soft, 2/10 pain preprocedure in Upper abdomen NEURO:  Alert & Oriented x 3  Lab Results: No results  for input(s): "WBC", "HGB", "HCT", "PLT" in the last 72 hours. BMET No results for input(s): "NA", "K", "CL", "CO2", "GLUCOSE", "BUN", "CREATININE", "CALCIUM" in the last 72 hours. LFT No results for input(s): "PROT", "ALBUMIN", "AST", "ALT", "ALKPHOS", "BILITOT", "BILIDIR", "IBILI" in the last 72 hours. PT/INR No results for input(s): "LABPROT", "INR" in the last 72 hours.   Impression / Plan: This is a 43 y.o.female who presents for EGD/EUS for recurrent pancreatitis rule out microlithiasis.  The risks of an EUS including intestinal perforation, bleeding, infection, aspiration, and medication effects were discussed as was the possibility it may not give a definitive diagnosis if a biopsy is performed.  When a biopsy of the pancreas is done as part of the EUS, there is an additional risk of pancreatitis at the rate of about 1-2%.  It was explained that procedure related pancreatitis is typically mild, although it can be severe and even life threatening, which is why we do not perform random pancreatic biopsies and only biopsy a lesion/area we feel is concerning enough to warrant the risk.   The risks and benefits of endoscopic evaluation/treatment were discussed with the patient and/or family; these include but are not limited to the risk of perforation, infection, bleeding, missed lesions, lack of diagnosis, severe  illness requiring hospitalization, as well as anesthesia and sedation related illnesses.  The patient's history has been reviewed, patient examined, no change in status, and deemed stable for procedure.  The patient and/or family is agreeable to proceed.    Corliss Parish, MD [facility] Office # [phone]

## 2023-06-20 NOTE — Progress Notes (Signed)
 SUBJECTIVE:  Chief Complaint: Obesity  Interim History: She is up 16 lbs since last visit.  Bio impedence scale reviewed with the patient:  Up in muscle mass but also up in adipose.   [name] [name] is a 43 year old female with obesity and type 2 diabetes who presents for follow-up of her obesity treatment plan.  She has a history of type 2 diabetes and was previously on [facility], which was discontinued due to a possible case of acute pancreatitis. She is currently scheduled for an upper endoscopy and possibly an ERCP later today.  Since discontinuing Mounjaro, she has experienced increased cravings, particularly for sweets, and has gained approximately 16 pounds. She attempted to manage her weight with topiramate but discontinued it after three days due to severe fatigue, describing herself as feeling 'like a zombie.'  She has been trying to manage her diet by increasing fiber intake, but this led to upper abdominal pain radiating to her back, which she suspects might be related to the rapid increase in fiber intake. Her current diet includes high protein foods such as grilled chicken, broccoli, spinach, salads, and eggs. Despite following her dietary plan, she consumes sweets at night, which she attributes to the absence of Mounjaro. She has increased her calorie intake to about 1500 calories per day, with protein intake ranging from 80 to 120 grams daily.  She has been working out regularly, focusing on cardio and light weight training, and has gained over five pounds in muscle mass. Her bowel movements are generally regular, though she occasionally uses Amitiza if she consumes too many sweets.  She reports a significant history of long COVID, which included episodes of syncope and collapse. She has been off Mounjaro for over two months and is concerned about her A1c levels due to increased cravings.  She was previously on atorvastatin for cholesterol management, which was stopped after  her hospitalization. It was felt that statin therapy which was begun several months ago is the most likely source of her pancreatitis. She has resumed taking vitamin D, B12, and a multivitamin without any issues. She also takes a probiotic.    [name] is here to discuss her progress with her obesity treatment plan. She is on the Category 1 Plan and states she is following her eating plan approximately 30 % of the time. She states she is exercising 30 minutes 4 times per week.   OBJECTIVE: Visit Diagnoses: Problem List Items Addressed This Visit     Generalized obesity   Type 2 diabetes mellitus without complication, without long-term current use of insulin (HCC) - Primary   Vitamin D deficiency   B12 deficiency   Acute pancreatitis  Suspected Acute Pancreatitis Suspected acute pancreatitis possibly related to atorvastatin . Less likely to be related on GLP/GIP therapy which she has been on for the past couple of years without complication.  Symptoms include upper abdominal pain radiating to the back. Currently off Mounjaro and atorvastatin. Awaiting results of upper endoscopy and possible ERCP to determine next steps. - Await results of upper endoscopy and possible ERCP - Monitor amylase and lipase levels as indicated by symptoms - Discuss findings with GI specialist and determine next steps  Obesity 43 year old female with obesity, experiencing a 16-pound weight gain and increased cravings for sweets after discontinuing Mounjaro due to suspected acute pancreatitis. Following a high-protein diet and exercising but struggling with nighttime cravings. Previous topiramate trials were unsuccessful due to fatigue. Discussed potential reintroduction of Mounjaro at a lower  dose if GI clearance is obtained. - Await results of upper endoscopy and possible ERCP - Consider reintroducing Mounjaro at a lower dose if GI clearance is obtained - Discuss potential use of pancreatic enzymes  post-procedure - Follow up in one month to reassess weight management plan  Type 2 Diabetes Mellitus Type 2 diabetes, previously managed with Mounjaro, which was discontinued due to suspected acute pancreatitis. Concerns about rising A1c levels due to increased cravings and weight gain. Metformin was previously not tolerated due to severe gastrointestinal side effects. Discussed potential reintroduction of Mounjaro at a lower dose if GI clearance is obtained. Considered alternative diabetes medications if Greggory Keen is not feasible. - Monitor A1c levels - Consider reintroducing Mounjaro at a lower dose if GI clearance is obtained - Evaluate alternative diabetes medications if Greggory Keen is not feasible  Hyperlipidemia Hyperlipidemia, previously managed with atorvastatin, which was discontinued due to suspected acute pancreatitis. Considering alternative lipid-lowering therapies. Discussed potential use of Repatha or other non-statin options if necessary. - Discuss alternative lipid-lowering medications with cardiology, such as Repatha - Reassess lipid profile after GI clearance  Long COVID Long COVID with episodes of syncope and collapse. Symptoms have improved over the past year with only two episodes. Encouraged gradual increase in physical activity. - Encourage gradual increase in physical activity  General Health Maintenance Patient is taking vitamin D, B12, multivitamin, and a vaginal probiotic without issues. - Continue current supplements - Encourage balanced diet and regular exercise  Follow-up - Follow up on July 18, 2023, at 3:30 PM.  Vitals Temp: 97.7 F (36.5 C) BP: 118/67 Pulse Rate: (!) 59 SpO2: 100 %   Anthropometric Measurements Height: 5\' 8"  (1.727 m) Weight: 202 lb (91.6 kg) BMI (Calculated): 30.72 Weight at Last Visit: 186lb Weight Lost Since Last Visit: 0 Weight Gained Since Last Visit: 16lb Starting Weight: 243lb Total Weight Loss (lbs): 49 lb (22.2  kg) Peak Weight: 244lb   Body Composition  Body Fat %: 40.8 % Fat Mass (lbs): 82.4 lbs Muscle Mass (lbs): 113.6 lbs Total Body Water (lbs): 89.8 lbs Visceral Fat Rating : 8   Other Clinical Data Fasting: yes Labs: no Today's Visit #: 36 Starting Date: 06/18/19     ASSESSMENT AND PLAN:  Diet: Monte is currently in the action stage of change. As such, her goal is to continue with weight loss efforts. She has agreed to Category 1 Plan.  Exercise: Andree has been instructed to work up to a goal of 150 minutes of combined cardio and strengthening exercise per week for weight loss and overall health benefits.   Behavior Modification:  We discussed the following Behavioral Modification Strategies today: increasing lean protein intake, decreasing simple carbohydrates, increasing vegetables, increase H2O intake, no skipping meals, meal planning and cooking strategies, avoiding temptations, and planning for success. We discussed various medication options to help Petrea with her weight loss efforts and we both agreed to wait for results of GI work up before considering resumption of Mounjaro at this point. She will continue to work on nutritional and behavioral strategies to promote weight loss.  .  Return in about 4 weeks (around 07/18/2023).Marland Kitchen She was informed of the importance of frequent follow up visits to maximize her success with intensive lifestyle modifications for her multiple health conditions.  Attestation Statements:   Reviewed by clinician on day of visit: allergies, medications, problem list, medical history, surgical history, family history, social history, and previous encounter notes.   Time spent on visit including pre-visit chart review and  post-visit care and charting was 50 minutes.    Deysi Soldo, PA-C

## 2023-06-20 NOTE — Anesthesia Postprocedure Evaluation (Signed)
Anesthesia Post Note  Patient: [name] [name] [facility]  Procedure(s) Performed: UPPER ENDOSCOPIC ULTRASOUND (EUS) RADIAL ESOPHAGOGASTRODUODENOSCOPY (EGD) WITH PROPOFOL BIOPSY     Patient location during evaluation: PACU Anesthesia Type: MAC Level of consciousness: awake Pain management: pain level controlled Vital Signs Assessment: post-procedure vital signs reviewed and stable Respiratory status: spontaneous breathing, nonlabored ventilation and respiratory function stable Cardiovascular status: stable and blood pressure returned to baseline Postop Assessment: no apparent nausea or vomiting Anesthetic complications: no   No notable events documented.  Last Vitals:  Vitals:   06/20/23 1130 06/20/23 1135  BP: 116/64 (!) 117/54  Pulse: 61 62  Resp: 14 16  Temp:    SpO2: 99% 100%    Last Pain:  Vitals:   06/20/23 1135  TempSrc:   PainSc: 3                  Linton Rump

## 2023-06-20 NOTE — Transfer of Care (Signed)
Immediate Anesthesia Transfer of Care Note  Patient: [name] [name] [facility]  Procedure(s) Performed: UPPER ENDOSCOPIC ULTRASOUND (EUS) RADIAL ESOPHAGOGASTRODUODENOSCOPY (EGD) WITH PROPOFOL BIOPSY  Patient Location: PACU  Anesthesia Type:MAC  Level of Consciousness: awake  Airway & Oxygen Therapy: Patient Spontanous Breathing  Post-op Assessment: Report given to RN and Post -op Vital signs reviewed and stable  Post vital signs: Reviewed and stable  Last Vitals:  Vitals Value Taken Time  BP 108/59 06/20/23 1122  Temp 97.5   Pulse 77 06/20/23 1123  Resp 15 06/20/23 1123  SpO2 97 % 06/20/23 1123  Vitals shown include unfiled device data.  Last Pain:  Vitals:   06/20/23 0950  TempSrc: Temporal  PainSc: 2          Complications: No notable events documented.

## 2023-06-20 NOTE — Op Note (Signed)
[facility] Patient Name: [name] [name] Procedure Date: 06/20/2023 MRN: [id number] Attending MD: Corliss Parish , MD, [id number] Date of Birth: [birth year] CSN: [id number] Age: 43 Admit Type: Outpatient Procedure:                Upper EUS Indications:              Abnormal abdominal/pelvic CT scan, Acute recurrent                            pancreatitis Providers:                Corliss Parish, MD, Stephens Shire RN, RN, Priscella Mann, Technician Referring MD:              Medicines:                Monitored Anesthesia Care Complications:            No immediate complications. Estimated Blood Loss:     Estimated blood loss was minimal. Procedure:                Pre-Anesthesia Assessment:                           - Prior to the procedure, a History and Physical                            was performed, and patient medications and                            allergies were reviewed. The patient's tolerance of                            previous anesthesia was also reviewed. The risks                            and benefits of the procedure and the sedation                            options and risks were discussed with the patient.                            All questions were answered, and informed consent                            was obtained. Prior Anticoagulants: The patient has                            taken no anticoagulant or antiplatelet agents. ASA                            Grade Assessment: III - A patient with severe                            systemic  disease. After reviewing the risks and                            benefits, the patient was deemed in satisfactory                            condition to undergo the procedure.                           After obtaining informed consent, the endoscope was                            passed under direct vision. Throughout the                            procedure, the patient's  blood pressure, pulse, and                            oxygen saturations were monitored continuously. The                            [id number]-[id number] ([id number] Olympus endoscope was introduced                            through the mouth, and advanced to the second part                            of duodenum. The [id number] ([id number]) Olympus linear                            ultrasound scope was introduced through the mouth,                            and advanced to the duodenum for ultrasound                            examination from the stomach and duodenum. The                            upper EUS was accomplished without difficulty. The                            patient tolerated the procedure. Scope In: Scope Out: Findings:      ENDOSCOPIC FINDING: :      No gross lesions were noted in the proximal esophagus and in the mid       esophagus.      LA Grade A (one or more mucosal breaks less than 5 mm, not extending       between tops of 2 mucosal folds) esophagitis was found at the very       distal esophagus and the gastroesophageal junction.      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 This was characterized by healthy appearing mucosa and an  intact appearance.      One non-bleeding superficial gastric ulcer with a clean ulcer base       (Forrest Class III) was found in the gastric antrum. The lesion was 8 mm       in largest dimension.      Patchy mildly erythematous mucosa without bleeding was found in the       entire examined stomach. Biopsies were taken with a cold forceps for       histology and Helicobacter pylori testing.      No gross lesions were noted in the duodenal bulb, in the first portion       of the duodenum and in the second portion of the duodenum.      The major papilla was normal.      ENDOSONOGRAPHIC FINDING: :      There was no sign of significant endosonographic abnormality in the       common bile duct (2.8 ->  5.7 mm) and in the common hepatic duct (7.5       mm). No stones, no biliary sludge and ducts with regular contour were       identified. No evidence of choledocholithiasis.      There was no sign of significant endosonographic abnormality in the       pancreatic head (PD = 1.9 mm), genu of the pancreas (PD = 0.9 mm),       pancreatic body (PD = 1.1 mm), pancreatic tail (PD = 0.4 mm) and       uncinate process of the pancreas. No masses, no cysts, no       calcifications, the pancreatic duct was thin in caliber.      Endosonographic imaging of the ampulla showed no intramural       (subepithelial) lesion.      Endosonographic imaging in the visualized portion of the liver showed no       mass.      No malignant-appearing lymph nodes were visualized in the celiac region       (level 20), peripancreatic region and porta hepatis region.      The celiac region was visualized. Impression:               EGD impression:                           - No gross lesions in the proximal esophagus and in                            the mid esophagus.                           - LA Grade A esophagitis found in the very distal                            esophagus and GE junction..                           - A sleeve gastrectomy was found, characterized by                            an intact appearance and healthy appearing mucosa.                           -  Non-bleeding gastric ulcer with a clean ulcer                            base (Forrest Class III) was found in the antrum.                           - Erythematous mucosa in the stomach. Biopsied.                           - No gross lesions in the duodenal bulb, in the                            first portion of the duodenum and in the second                            portion of the duodenum.                           - Normal major papilla.                           EUS impression:                           - There was no sign of significant  pathology in the                            common bile duct and in the common hepatic duct. No                            evidence of choledocholithiasis.                           - There was no sign of significant pathology in the                            pancreatic head, genu of the pancreas, pancreatic                            body, pancreatic tail and uncinate process of the                            pancreas. The pancreas duct is normal size.                           - No malignant-appearing lymph nodes were                            visualized in the celiac region (level 20),                            peripancreatic region and porta hepatis region. Moderate Sedation:      Not Applicable - Patient had care per Anesthesia. Recommendation:           -  The patient will be observed post-procedure,                            until all discharge criteria are met.                           - Discharge patient to home.                           - Patient has a contact number available for                            emergencies. The signs and symptoms of potential                            delayed complications were discussed with the                            patient. Return to normal activities tomorrow.                            Written discharge instructions were provided to the                            patient.                           - Resume previous diet.                           - Initiate Nexium 40 mg twice daily for 1 month                            then decrease to once daily. I am choosing Nexium                            over omeprazole and pantoprazole, as a result of                            the patient having history of pancreatitis and                            omeprazole and pantoprazole are classic for agents                            that have documented pancreatitis risk and would                            like to minimize issues if possible.                            - Repeat upper endoscopy in 4 months to ensure  healing of gastric ulcer and also follow-up                            esophagitis can be considered by primary                            gastroenterologist.                           - Await path results.                           - Observe patient's clinical course.                           - Will discuss with patient's primary                            gastroenterologist, but I do not see a complete                            contraindication to patient being restarted on                            Mounjaro (she had been on this for years prior to                            the episode of most recent pancreatitis). I do                            however have concern about reinitiation of                            atorvastatin, which has associated pancreatitis                            risk and was initiated around the time of her most                            recent episode of pancreatitis.                           - The findings and recommendations were discussed                            with the patient.                           - The findings and recommendations were discussed                            with the designated responsible adult. Procedure Code(s):        --- Professional ---  16109, Esophagogastroduodenoscopy, flexible,                            transoral; with endoscopic ultrasound examination                            limited to the esophagus, stomach or duodenum, and                            adjacent structures                           43239, Esophagogastroduodenoscopy, flexible,                            transoral; with biopsy, single or multiple Diagnosis Code(s):        --- Professional ---                           K20.90, Esophagitis, unspecified without bleeding                           Z98.84, Bariatric surgery status                            K25.9, Gastric ulcer, unspecified as acute or                            chronic, without hemorrhage or perforation                           K31.89, Other diseases of stomach and duodenum                           I89.9, Noninfective disorder of lymphatic vessels                            and lymph nodes, unspecified                           K85.90, Acute pancreatitis without necrosis or                            infection, unspecified                           R93.5, Abnormal findings on diagnostic imaging of                            other abdominal regions, including retroperitoneum CPT copyright 2022 [employer]. All rights reserved. The codes documented in this report are preliminary and upon coder review may  be revised to meet current compliance requirements. Corliss Parish, MD 06/20/2023 11:36:19 AM Number of Addenda: 0

## 2023-06-21 LAB — SURGICAL PATHOLOGY

## 2023-06-24 ENCOUNTER — Other Ambulatory Visit: Payer: Self-pay

## 2023-06-24 ENCOUNTER — Encounter (HOSPITAL_COMMUNITY): Payer: Self-pay | Admitting: Gastroenterology

## 2023-06-26 ENCOUNTER — Encounter: Payer: Self-pay | Admitting: Gastroenterology

## 2023-07-08 ENCOUNTER — Ambulatory Visit (INDEPENDENT_AMBULATORY_CARE_PROVIDER_SITE_OTHER): Payer: Self-pay

## 2023-07-08 DIAGNOSIS — R55 Syncope and collapse: Secondary | ICD-10-CM | POA: Diagnosis not present

## 2023-07-10 LAB — CUP PACEART REMOTE DEVICE CHECK
Date Time Interrogation Session: 20250302230352
Implantable Pulse Generator Implant Date: 20220216

## 2023-07-15 ENCOUNTER — Ambulatory Visit (HOSPITAL_BASED_OUTPATIENT_CLINIC_OR_DEPARTMENT_OTHER): Payer: No Typology Code available for payment source | Admitting: Obstetrics & Gynecology

## 2023-07-15 ENCOUNTER — Other Ambulatory Visit (HOSPITAL_COMMUNITY)
Admission: RE | Admit: 2023-07-15 | Discharge: 2023-07-15 | Disposition: A | Discharge status: Home / Self Care | Source: Ambulatory Visit | Attending: Obstetrics & Gynecology | Admitting: Obstetrics & Gynecology

## 2023-07-15 ENCOUNTER — Encounter (HOSPITAL_BASED_OUTPATIENT_CLINIC_OR_DEPARTMENT_OTHER): Payer: Self-pay | Admitting: Obstetrics & Gynecology

## 2023-07-15 ENCOUNTER — Other Ambulatory Visit (HOSPITAL_BASED_OUTPATIENT_CLINIC_OR_DEPARTMENT_OTHER): Payer: Self-pay

## 2023-07-15 VITALS — BP 106/76 | HR 73 | Ht 68.0 in | Wt 203.6 lb

## 2023-07-15 DIAGNOSIS — Z975 Presence of (intrauterine) contraceptive device: Secondary | ICD-10-CM

## 2023-07-15 DIAGNOSIS — Z9884 Bariatric surgery status: Secondary | ICD-10-CM

## 2023-07-15 DIAGNOSIS — Z124 Encounter for screening for malignant neoplasm of cervix: Secondary | ICD-10-CM

## 2023-07-15 DIAGNOSIS — N898 Other specified noninflammatory disorders of vagina: Secondary | ICD-10-CM | POA: Diagnosis not present

## 2023-07-15 DIAGNOSIS — Z1231 Encounter for screening mammogram for malignant neoplasm of breast: Secondary | ICD-10-CM

## 2023-07-15 DIAGNOSIS — Z01419 Encounter for gynecological examination (general) (routine) without abnormal findings: Secondary | ICD-10-CM | POA: Diagnosis not present

## 2023-07-15 MED ORDER — METRONIDAZOLE 500 MG PO TABS
500.0000 mg | ORAL_TABLET | Freq: Two times a day (BID) | ORAL | 0 refills | Status: DC
  Filled 2023-07-15: qty 14, 7d supply, fill #0

## 2023-07-15 NOTE — Progress Notes (Unsigned)
 ANNUAL EXAM Patient name: [name] [name] MRN [id number]  Date of birth: [birth year] Chief Complaint:   New Patient (Initial Visit) (Annual Exam)  History of Present Illness:   BRILYN [name] is a 43 y.o. G37P1021 Caucasian female being seen today for a routine annual exam.  Denies vaginal bleeding.  Has IUD and has occasional spotting.  Most recent IUD was placed 07/07/2021.    Has been having some yeast vaginitis issues.  Also noticing some odor.  Feels this is related to excess tissue present due to weight loss.  Had abdominoplasty and mons lift 08/2021 with Dr. Ulice Bold.  In some ways, this actually hasn't helped and maybe worsened.   No LMP recorded. (Menstrual status: IUD).  Last pap 09/23/2020. Results were: NILM w/ HRHPV negative. H/O abnormal pap: yes remote hx when she was 20 Last mammogram: 11/02/2020. Results were: normal. Family h/o breast cancer: yes paternal aunt Last colonoscopy: Around 2016. Guidelines reviewed.  Will repeat at age 81.      07/15/2023    3:59 PM 08/29/2022    1:15 PM 06/13/2022    9:45 AM 05/23/2022   11:07 AM 01/17/2021   12:57 PM  Depression screen PHQ 2/9  Decreased Interest 0 0 0 0 0  Down, Depressed, Hopeless 0 1 0 0 0  PHQ - 2 Score 0 1 0 0 0  Altered sleeping  1 1 2    Tired, decreased energy  1 1 1    Change in appetite  0 1 0   Feeling bad or failure about yourself   0 0 0   Trouble concentrating  0 1 1   Moving slowly or fidgety/restless  0 0 1   Suicidal thoughts  0 0 0   PHQ-9 Score  3 4 5    Difficult doing work/chores  Not difficult at all       Review of Systems:   Pertinent items are noted in HPI  Denies pelvic pain.  She is having some urinary tract infection issues as well as vaginitis issues.    Pertinent History Reviewed:  Reviewed past medical,surgical, social and family history.  Reviewed problem list, medications and allergies. Physical Assessment:   Vitals:   07/15/23 1557  BP: 106/76  Pulse: 73  Weight: 203 lb 9.6  oz (92.4 kg)  Height: 5\' 8"  (1.727 m)  Body mass index is 30.96 kg/m.        Physical Examination:   General appearance - well appearing, and in no distress  Mental status - alert, oriented to person, place, and time  Psych:  She has a normal mood and affect  Skin - warm and dry, normal color, no suspicious lesions noted  Chest - effort normal, all lung fields clear to auscultation bilaterally  Heart - normal rate and regular rhythm  Neck:  midline trachea, no thyromegaly or nodules  Breasts - breasts appear normal, no suspicious masses, no skin or nipple changes or  axillary nodes  Abdomen - soft, nontender, nondistended, no masses or organomegaly  Pelvic - VULVA: normal appearing vulva with no masses, tenderness or lesions   VAGINA: normal appearing vagina with normal color and discharge, no lesions   CERVIX: normal appearing cervix without discharge or lesions, no CMT, IUD string noted  Thin prep pap is done with HR HPV cotesting  UTERUS: uterus is felt to be normal size, shape, consistency and nontender   ADNEXA: No adnexal masses or tenderness noted.  Rectal - normal rectal, good sphincter  tone, no masses felt.  Extremities:  No swelling or varicosities noted  Chaperone present for exam, Ina Homes, CMA.   Assessment & Plan:  1. Well woman exam with routine gynecological exam (Primary) - Pap smear 09/2020.  Updated today.   - Mammogram 10/2020.  MMG ordered.   - Colonoscopy guidelines reviewed.  Will repeat around age 47. - vaccines reviewed/updated  2. Cervical cancer screening - Cytology - PAP( [place])  3. Encounter for screening mammogram for malignant neoplasm of breast - MM 3D SCREENING MAMMOGRAM BILATERAL BREAST; Future  4. Vaginal discharge  5. Vaginal odor - based on physical exam findings, feel this is BV so will start treatment with metroNIDAZOLE (FLAGYL) 500 MG tablet; Take 1 tablet (500 mg total) by mouth 2 (two) times daily.  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 IUD (intrauterine device) in place - placed in 2023.   Meds:  Meds ordered this encounter  Medications   metroNIDAZOLE (FLAGYL) 500 MG tablet    Sig: Take 1 tablet (500 mg total) by mouth 2 (two) times daily.    Dispense:  14 tablet    Refill:  0    Follow-up: Return in about 1 year (around 07/14/2024).  Jerene Bears, MD 07/17/2023 12:59 PM

## 2023-07-15 NOTE — Progress Notes (Signed)
 [facility] Summary Report / Loop Recorder

## 2023-07-16 LAB — CERVICOVAGINAL ANCILLARY ONLY
Bacterial Vaginitis (gardnerella): POSITIVE — AB
Candida Glabrata: NEGATIVE
Candida Vaginitis: NEGATIVE
Chlamydia: NEGATIVE
Comment: NEGATIVE
Comment: NEGATIVE
Comment: NEGATIVE
Comment: NEGATIVE
Comment: NEGATIVE
Comment: NORMAL
Neisseria Gonorrhea: NEGATIVE
Trichomonas: NEGATIVE

## 2023-07-17 ENCOUNTER — Encounter (HOSPITAL_BASED_OUTPATIENT_CLINIC_OR_DEPARTMENT_OTHER): Payer: Self-pay | Admitting: Obstetrics & Gynecology

## 2023-07-17 DIAGNOSIS — Z975 Presence of (intrauterine) contraceptive device: Secondary | ICD-10-CM | POA: Insufficient documentation

## 2023-07-17 LAB — CYTOLOGY - PAP
Comment: NEGATIVE
Diagnosis: NEGATIVE
High risk HPV: NEGATIVE

## 2023-07-18 ENCOUNTER — Other Ambulatory Visit (HOSPITAL_COMMUNITY): Payer: Self-pay

## 2023-07-18 ENCOUNTER — Ambulatory Visit (INDEPENDENT_AMBULATORY_CARE_PROVIDER_SITE_OTHER): Payer: No Typology Code available for payment source | Admitting: Physician Assistant

## 2023-07-18 ENCOUNTER — Encounter (INDEPENDENT_AMBULATORY_CARE_PROVIDER_SITE_OTHER): Payer: Self-pay | Admitting: Physician Assistant

## 2023-07-18 VITALS — BP 100/66 | HR 81 | Temp 98.6°F | Ht 68.0 in | Wt 203.0 lb

## 2023-07-18 DIAGNOSIS — E669 Obesity, unspecified: Secondary | ICD-10-CM

## 2023-07-18 DIAGNOSIS — E538 Deficiency of other specified B group vitamins: Secondary | ICD-10-CM | POA: Diagnosis not present

## 2023-07-18 DIAGNOSIS — E119 Type 2 diabetes mellitus without complications: Secondary | ICD-10-CM

## 2023-07-18 DIAGNOSIS — K853 Drug induced acute pancreatitis without necrosis or infection: Secondary | ICD-10-CM

## 2023-07-18 DIAGNOSIS — K859 Acute pancreatitis without necrosis or infection, unspecified: Secondary | ICD-10-CM

## 2023-07-18 DIAGNOSIS — E559 Vitamin D deficiency, unspecified: Secondary | ICD-10-CM

## 2023-07-18 DIAGNOSIS — Z7985 Long-term (current) use of injectable non-insulin antidiabetic drugs: Secondary | ICD-10-CM

## 2023-07-18 DIAGNOSIS — Z6830 Body mass index (BMI) 30.0-30.9, adult: Secondary | ICD-10-CM

## 2023-07-18 DIAGNOSIS — Z9884 Bariatric surgery status: Secondary | ICD-10-CM

## 2023-07-18 MED ORDER — CYANOCOBALAMIN 1000 MCG/ML IJ SOLN
1000.0000 ug | INTRAMUSCULAR | 0 refills | Status: DC
  Filled 2023-07-18: qty 2, 28d supply, fill #0

## 2023-07-18 MED ORDER — VITAMIN D (ERGOCALCIFEROL) 1.25 MG (50000 UNIT) PO CAPS
50000.0000 [IU] | ORAL_CAPSULE | ORAL | 0 refills | Status: DC
  Filled 2023-07-18: qty 4, 28d supply, fill #0

## 2023-07-18 MED ORDER — TIRZEPATIDE 5 MG/0.5ML ~~LOC~~ SOAJ
5.0000 mg | SUBCUTANEOUS | 0 refills | Status: DC
Start: 2023-07-18 — End: 2023-07-18
  Filled 2023-07-18: qty 2, 28d supply, fill #0

## 2023-07-18 MED ORDER — TIRZEPATIDE 5 MG/0.5ML ~~LOC~~ SOAJ
5.0000 mg | SUBCUTANEOUS | 1 refills | Status: DC
  Filled 2023-07-18: qty 2, 28d supply, fill #0
  Filled 2023-08-12: qty 2, 28d supply, fill #1

## 2023-07-18 NOTE — Progress Notes (Signed)
 SUBJECTIVE: Discussed the use of AI scribe software for clinical note transcription with the patient, who gave verbal consent to proceed.  Chief Complaint: Obesity  Interim History: She is up 1 lb since last visit.  Down 48 lbs overall TBW loss of 19.8%  [name] [name] is here to discuss her progress with her obesity treatment plan. She is on the Category 1 Plan and states she is following her eating plan approximately 95 % of the time. She states she is exercising cardio/resistance training 30 minutes 7 times per week.  The patient is a 43 year old with obesity and type 2 diabetes who presents for follow-up of her obesity treatment plan.  She has a history of type 2 diabetes, vitamin D deficiency, B12 deficiency, and previous gastric bypass surgery. She was started on atorvastatin for cholesterol management but developed acute pancreatitis, which was attributed to the statin therapy rather than her GLP-1 therapy, Mounjaro, which she had been on for a couple of years. After a GI workup, including an upper endoscopy that showed esophagitis and a slight gastric ulcer without bleeding, she was restarted on Mounjaro. She had experienced significant weight regain after being off Mounjaro for several months and was restarted on it in mid-February. She feels good and is drinking water with electrolytes to stay hydrated. She is working on Museum/gallery exhibitions officer mass and is focused on getting back into her jeans, expressing frustration with wearing leggings and sweatpants.  She is currently on Mounjaro, vitamin D, and B12 shots. She follows her bariatric multivitamin regimen and takes calcium citrate chews three times a day. She uses a savings coupon for [facility], paying $25 for it.  She is experiencing skin irritation and yeast infections due to excess skin, which is causing problems such as trapping urine and leading to bacterial infections. She is scheduled to see a plastic surgeon specialist in July for potential  revision surgery.  She experiences occasional dizziness when getting off the elliptical, which she attributes to not cooling down properly. She is following her bariatric multivitamin regimen closely and is considering checking her vitamin levels again in the future.  Her diet includes two eggs, a slice of toast, and cheese in the mornings, [place] yogurt with an apple or cheese stick for snacks, and a salad or wrap for lunch. She occasionally snacks on pork rinds.  Plan fasting labs at [facility] in May.  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 Peak weight 327 lbs Nadir weight 221 lbs. Has portion restriction residually.   OBJECTIVE: Visit Diagnoses: Problem List Items Addressed This Visit     Generalized obesity   Relevant Medications   tirzepatide (MOUNJARO) 5 MG/0.5ML Pen   Type 2 diabetes mellitus without complication, without long-term current use of insulin (HCC) - Primary   Relevant Medications   tirzepatide (MOUNJARO) 5 MG/0.5ML Pen   Vitamin D deficiency   Relevant Medications   Vitamin D, Ergocalciferol, (DRISDOL) 1.25 MG (50000 UNIT) CAPS capsule   B12 deficiency   Relevant Medications   cyanocobalamin (VITAMIN B12) 1000 MCG/ML injection   S/P bariatric surgery   Acute pancreatitis   Other Visit Diagnoses       BMI 30.0-30.9,adult Current BMI 30.9          Obesity [name] is experiencing significant weight regain after discontinuing Mounjaro due to concerns about pancreatitis, which was later attributed to atorvastatin. Mounjaro was resumed at 5 mg, with consideration to increase to 7.5 mg if weight loss plateaus. She is actively managing  her weight through diet and exercise, building muscle mass, and monitoring her response to the current regimen. - Refill Mounjaro at 5 mg - Consider increasing Mounjaro to 7.5 mg if weight loss plateaus - Continue current diet and exercise regimen   Type 2 Diabetes Mellitus with other specified complication,  without long-term current use of insulin HgbA1c is at goal. Last A1c was 5.2 Statin stopped due to pancreatitis BP will not support use of ACE or ARB ( Hx Potts/Long Covid)  Medication(s): Mounjaro 2.5 mg SQ weekly Denies mass in neck, dysphagia, dyspepsia, persistent hoarseness, abdominal pain, or N/V/Constipation or diarrhea. Has annual eye exam. Mood is stable.   Lab Results  Component Value Date   HGBA1C 5.2 12/10/2022   HGBA1C 5.2 04/11/2022   HGBA1C 5.7 (H) 12/06/2021   Lab Results  Component Value Date   MICROALBUR 30 05/23/2022   LDLCALC 83 04/22/2023   CREATININE 0.90 04/25/2023   No results found for: "GFR"  Plan: Continue and increase dose Mounjaro 5.0 mg SQ weekly and titrate and monitor closely  She is working  on nutrition plan to decrease simple carbohydrates, increase lean proteins and exercise to promote weight loss and improve glycemic control .    Skin Irritation and Infections Excess skin post-weight loss is causing skin irritation, yeast infections, and urinary tract infections due to trapped urine. She is scheduled for a plastic surgery evaluation at [name] for potential revision surgery to reduce infections and improve quality of life. - Awaiting consultation with plastic surgeon for evaluation and potential revision surgery for mons   History of pancreatitis related to Statin therapy  Statin discontinued.  After a GI workup by Dr. Meridee Score, including an upper endoscopy that showed esophagitis and a slight gastric ulcer without bleeding, no pancreatitic necrosis or infection.  Dr. Meridee Score and Dr. Rhea Belton were both in agreement that the pancreatitis was likely from new statin use and not related to GLP-1 RA/GIP medication use- Mounjaro.  She is on Nexium 40 mg BID for ulcer treatment and will need repeat EGD in 4 months from 06/20/23 for evaluation of healing.  She was restarted on Mounjaro as it was felt her pancreatitis was related to new statin therapy and  not to [name] which she had been on for a couple of years. She had experienced significant weight regain after being off Mounjaro for several months and was restarted on it in mid-February and she denies any abdominal pain, nausea, vomiting or diarrhea or constipation. Will continue to monitor closely for any recurrent symptoms or signs of pancreatitis.      Vitamin D Deficiency Vitamin D is at goal of 50.  Most recent vitamin D level was 66.1. She is on  prescription ergocalciferol 50,000 IU weekly. No N/V or muscle weakness with Ergo. Feeling stronger/better energy levels overall.  Lab Results  Component Value Date   VD25OH 66.1 12/10/2022   VD25OH 42.9 04/11/2022   VD25OH 58.7 12/06/2021    Plan: Continue and refill  prescription ergocalciferol 50,000 IU weekly Low vitamin D levels can be associated with adiposity and may result in leptin resistance and weight gain. Also associated with fatigue.  Currently on vitamin D supplementation without any adverse effects such as nausea, vomiting or muscle weakness.  Recheck vitamin D level in may  Vitamin B12 Deficiency Vitamin B12 deficiency is managed with B12 injections. No side effects. Feeling less fatigued.  - Continue/refill B12 injections Recheck B 12 level in May Follow-up Regular monitoring of her progress with  Mounjaro and overall health is necessary, with medication adjustments as needed. - Schedule follow-up appointment on May 5th for fasting labs - Ensure two-month supply of Mounjaro is available - Monitor lipid levels in future visits Vitals Temp: 98.6 F (37 C) BP: 100/66 Pulse Rate: 81 SpO2: 98 %   Anthropometric Measurements Height: 5\' 8"  (1.727 m) Weight: 203 lb (92.1 kg) BMI (Calculated): 30.87 Weight at Last Visit: 202 lb Weight Lost Since Last Visit: 0 Weight Gained Since Last Visit: 1 lb Starting Weight: 243 lb Total Weight Loss (lbs): 48 lb (21.8 kg) Peak Weight: 244 lb   Body Composition  Body  Fat %: 40.6 % Fat Mass (lbs): 82.4 lbs Muscle Mass (lbs): 114.6 lbs Total Body Water (lbs): 92.4 lbs Visceral Fat Rating : 8   Other Clinical Data Fasting: no Labs: no Today's Visit #: 37 Starting Date: 06/18/19     ASSESSMENT AND PLAN:  Diet: [name] [name] is currently in the action stage of change. As such, her goal is to continue with weight loss efforts and has agreed to the Category 1 Plan.   Exercise:  For substantial health benefits, adults should do at least 150 minutes (2 hours and 30 minutes) a week of moderate-intensity, or 75 minutes (1 hour and 15 minutes) a week of vigorous-intensity aerobic physical activity, or an equivalent combination of moderate- and vigorous-intensity aerobic activity. Aerobic activity should be performed in episodes of at least 10 minutes, and preferably, it should be spread throughout the week. and Adults should also include muscle-strengthening activities that involve all major muscle groups on 2 or more days a week.  Behavior Modification:  We discussed the following Behavioral Modification Strategies today: increasing lean protein intake, decreasing simple carbohydrates, increasing vegetables, increase H2O intake, increase high fiber foods, avoiding temptations, and planning for success. We discussed various medication options to help [name] [name] with her weight loss efforts and we both agreed to increase Mounjaro 5 mg weekly.  Return in about 4 weeks (around 08/15/2023).Marland Kitchen She was informed of the importance of frequent follow up visits to maximize her success with intensive lifestyle modifications for her multiple health conditions.  Attestation Statements:   Reviewed by clinician on day of visit: allergies, medications, problem list, medical history, surgical history, family history, social history, and previous encounter notes.   Time spent on visit including pre-visit chart review and post-visit care and charting was 42  minutes  Xoe Hoe,PA-C

## 2023-07-20 ENCOUNTER — Other Ambulatory Visit: Payer: Self-pay

## 2023-07-20 ENCOUNTER — Other Ambulatory Visit (HOSPITAL_COMMUNITY): Payer: Self-pay

## 2023-07-22 ENCOUNTER — Encounter (INDEPENDENT_AMBULATORY_CARE_PROVIDER_SITE_OTHER): Payer: Self-pay | Admitting: Physician Assistant

## 2023-08-12 ENCOUNTER — Ambulatory Visit (INDEPENDENT_AMBULATORY_CARE_PROVIDER_SITE_OTHER): Payer: Self-pay

## 2023-08-12 DIAGNOSIS — R55 Syncope and collapse: Secondary | ICD-10-CM | POA: Diagnosis not present

## 2023-08-12 NOTE — Addendum Note (Signed)
 Addended by: Geralyn Flash D on: 08/12/2023 10:48 AM   Modules accepted: Orders

## 2023-08-12 NOTE — Progress Notes (Signed)
 [facility] Summary Report / Loop Recorder

## 2023-08-13 LAB — CUP PACEART REMOTE DEVICE CHECK
Date Time Interrogation Session: 20250406230504
Implantable Pulse Generator Implant Date: 20220216

## 2023-08-14 ENCOUNTER — Encounter: Payer: Self-pay | Admitting: Internal Medicine

## 2023-08-19 NOTE — Progress Notes (Unsigned)
   SUBJECTIVE: Discussed the use of AI scribe software for clinical note transcription with the patient, who gave verbal consent to proceed.  Chief Complaint: Obesity  Interim History: ***  [name] is here to discuss her progress with her obesity treatment plan. She is on the {HWW Weight Loss Plan:210964005} and states she {CHL AMB IS/IS NOT:210130109} following her eating plan approximately *** % of the time. She states she {CHL AMB IS/IS NOT:210130109} exercising *** minutes *** times per week.   OBJECTIVE: Visit Diagnoses: Problem List Items Addressed This Visit     Generalized obesity   Type 2 diabetes mellitus without complication, without long-term current use of insulin (HCC) - Primary   Vitamin D deficiency   B12 deficiency    No data recorded No data recorded No data recorded No data recorded   ASSESSMENT AND PLAN:  Diet: [name] {CHL AMB IS/IS NOT:210130109} currently in the action stage of change. As such, her goal is to {HWW Weight Loss Efforts:210964006}. She {HAS HAS ZOX:09604} agreed to {HWW Weight Loss Plan:210964005}.  Exercise: [name] has been instructed {HWW Exercise:210964007} for weight loss and overall health benefits.   Behavior Modification:  We discussed the following Behavioral Modification Strategies today: {HWW Behavior Modification:210964008}. We discussed various medication options to help [name] [name] with her weight loss efforts and we both agreed to ***.  No follow-ups on file.[name] Aas She was informed of the importance of frequent follow up visits to maximize her success with intensive lifestyle modifications for her multiple health conditions.  Attestation Statements:   Reviewed by clinician on day of visit: allergies, medications, problem list, medical history, surgical history, family history, social history, and previous encounter notes.   Time spent on visit including pre-visit chart review and post-visit care and charting was *** minutes.     Gertrude Bucks, PA-C

## 2023-08-20 ENCOUNTER — Telehealth (INDEPENDENT_AMBULATORY_CARE_PROVIDER_SITE_OTHER): Payer: Self-pay

## 2023-08-20 ENCOUNTER — Ambulatory Visit (INDEPENDENT_AMBULATORY_CARE_PROVIDER_SITE_OTHER): Admitting: Physician Assistant

## 2023-08-20 DIAGNOSIS — E538 Deficiency of other specified B group vitamins: Secondary | ICD-10-CM

## 2023-08-20 DIAGNOSIS — E559 Vitamin D deficiency, unspecified: Secondary | ICD-10-CM

## 2023-08-20 DIAGNOSIS — E119 Type 2 diabetes mellitus without complications: Secondary | ICD-10-CM

## 2023-08-20 DIAGNOSIS — E669 Obesity, unspecified: Secondary | ICD-10-CM

## 2023-08-20 NOTE — Telephone Encounter (Signed)
 Calling to try to reschedule appointment.

## 2023-09-08 NOTE — Progress Notes (Unsigned)
 SUBJECTIVE: Discussed the use of AI scribe software for clinical note transcription with the patient, who gave verbal consent to proceed.  Chief Complaint: Obesity  Interim History: She is down 5 lbs since her last visit 2 months ago.  Down 45 lbs overall TBW loss of 18.52%  [name] is here to discuss her progress with her obesity treatment plan. She is on the Category 1 Plan and states she is following her eating plan approximately 95 % of the time. She states she is exercising walking for 30 minutes 3 times per week.  [name] [name] is a 43 year old female with obesity and type 2 diabetes who presents for a follow-up on her obesity treatment plan.  She has lost five pounds since her last visit. She experiences occasional nausea, particularly the day after taking her medication, which she manages with Zofran .  She has a history of type 2 diabetes and recent pancreatitis attributed to statin therapy, which has been discontinued. No abdominal pain, constipation, diarrhea, or vomiting. She also has a history of POTS, with recent low blood pressure, and is cautious about fasting.   She has vitamin D  and B12 deficiencies and is on a regimen including B12 injections every fourteen days and a 90-day supply of vitamin D . She has not had recent levels checked but plans to do so.  She is dealing with yeast infections in the creases of her legs, which are itchy, red, and spreading. The has resolved in the past using Nystatin  cream.   Socially, she is experiencing stress due to family matters.  Pharmacotherapy: Mounjaro  7.5 mg weekly. Denies mass in neck, dysphagia, dyspepsia, persistent hoarseness, abdominal pain, or N/V or diarrhea. Has annual eye exam. Mood is stable.   Bariatric Surgery: S/P Gastric sleeve surgery 2018- Dr. Veronia Goon OBJECTIVE: Visit Diagnoses: Problem List Items Addressed This Visit     Generalized obesity   Relevant Medications   tirzepatide  (MOUNJARO ) 7.5 MG/0.5ML  Pen   Type 2 diabetes mellitus without complication, without long-term current use of insulin  (HCC) - Primary   Relevant Medications   SYRINGE-NEEDLE, DISP, 3 ML (B-D 3CC LUER-LOK SYR 25GX5/8") 25G X 5/8" 3 ML MISC   tirzepatide  (MOUNJARO ) 7.5 MG/0.5ML Pen   nystatin  cream (MYCOSTATIN )   Vitamin D  deficiency   Relevant Medications   Vitamin D , Ergocalciferol , (DRISDOL ) 1.25 MG (50000 UNIT) CAPS capsule   B12 deficiency   Relevant Medications   cyanocobalamin  (VITAMIN B12) 1000 MCG/ML injection   S/P bariatric surgery   Acute pancreatitis   Other Visit Diagnoses       Yeast infection of the skin       Relevant Medications   nystatin  cream (MYCOSTATIN )     BMI 30.0-30.9,adult Current BMI 30.2         Obesity Obesity management is ongoing with a five-pound weight loss since the last visit. She experiences occasional nausea post-medication, managed with Zofran . Increasing the Mounjaro  dosage to 7.5 mg is planned, as she previously responded well to this dosage. - Increase Mounjaro  dosage to 7.5 mg. - Encourage maintaining protein intake and hydration. - Schedule follow-up appointment in one month to assess progress.  Yeast infection in skin folds Yeast infection in the skin folds, particularly in the crease of the legs, causing discomfort and itching, with spread towards the vaginal area. Nystatin  cream is preferred for treatment. - Prescribe Nystatin  cream to be applied to affected areas twice daily. - Send prescription to [facility] for overnight delivery.  Type 2  Diabetes Mellitus with hyperglycemia, without long-term current use of insulin  HgbA1c is at goal. Last A1c was 5.2 NO STATIN DUE TO PANCREATITIS NO ARB OR ACE DUE TO POTS with low BP and HR Medication(s): Mounjaro  7.5 mg SQ weekly  Lab Results  Component Value Date   HGBA1C 5.2 12/10/2022   HGBA1C 5.2 04/11/2022   HGBA1C 5.7 (H) 12/06/2021   Lab Results  Component Value Date   MICROALBUR 30 05/23/2022   LDLCALC 83  04/22/2023   CREATININE 0.90 04/25/2023   No results found for: "GFR"  Plan: Continue and refill Mounjaro  7.5 mg SQ weekly She is working  on nutrition plan to decrease simple carbohydrates, increase lean proteins and exercise to promote weight loss and improve glycemic control .   History of Pancreatitis Pancreatitis likely related to statin therapy, which has been discontinued. Advised to avoid statins in the future. No symptoms or signs of pancreatitis this visit. Denies abdominal pain, or N/V/Constipation or diarrhea.  Monitor closely back on GLP-1/GIP medication.  Vitamin D  and B12 deficiencies- S/P gastric sleeve Ongoing management of vitamin D  and B12 deficiencies with a 90-day supply of supplements. Fasting labs for vitamin D  and B12 levels are planned for the next visit. - Continue vitamin D  and B12 supplementation with a 90-day supply. - Schedule fasting labs for vitamin D  and B12 levels at the next visit. Meds ordered this encounter  Medications   SYRINGE-NEEDLE, DISP, 3 ML (B-D 3CC LUER-LOK SYR 25GX5/8") 25G X 5/8" 3 ML MISC    Sig: Use to inject b12 every other week.    Dispense:  12 each    Refill:  0   Vitamin D , Ergocalciferol , (DRISDOL ) 1.25 MG (50000 UNIT) CAPS capsule    Sig: Take 1 capsule (50,000 Units total) by mouth every 7 (seven) days.    Dispense:  12 capsule    Refill:  0   tirzepatide  (MOUNJARO ) 7.5 MG/0.5ML Pen    Sig: Inject 7.5 mg into the skin once a week.    Dispense:  6 mL    Refill:  0   cyanocobalamin  (VITAMIN B12) 1000 MCG/ML injection    Sig: Inject 1 mL (1,000 mcg total) into the skin every 14 (fourteen) days.    Dispense:  6 mL    Refill:  0   nystatin  cream (MYCOSTATIN )    Sig: Apply 1 Application topically 2 (two) times daily.    Dispense:  180 g    Refill:  0    POTS No specific issues discussed related to POTS during this visit.  Vitals Temp: 97.9 F (36.6 C) BP: 107/75 Pulse Rate: 67 SpO2: 98 %   Anthropometric  Measurements Height: 5\' 8"  (1.727 m) Weight: 198 lb (89.8 kg) BMI (Calculated): 30.11 Weight at Last Visit: 203 lb Weight Lost Since Last Visit: 5 lb Weight Gained Since Last Visit: 0 Starting Weight: 243 lb Total Weight Loss (lbs): 45 lb (20.4 kg) Peak Weight: 244 lb   Body Composition  Body Fat %: 38.9 % Fat Mass (lbs): 77.4 lbs Muscle Mass (lbs): 115.2 lbs Total Body Water (lbs): 85.6 lbs Visceral Fat Rating : 8   Other Clinical Data Fasting: yes Labs: no Today's Visit #: 38 Starting Date: 06/18/19     ASSESSMENT AND PLAN:  Diet: Pebbles is currently in the action stage of change. As such, her goal is to continue with weight loss efforts. She has agreed to Category 1 Plan.  Exercise: Lasharon has been instructed to work up to  a goal of 150 minutes of combined cardio and strengthening exercise per week for weight loss and overall health benefits.   Behavior Modification:  We discussed the following Behavioral Modification Strategies today: increasing lean protein intake, decreasing simple carbohydrates, increasing vegetables, increase H2O intake, increase high fiber foods, no skipping meals, avoiding temptations, and planning for success. We discussed various medication options to help Kaliannah with her weight loss efforts and we both agreed to increase Mounjaro  to 7.5 mg weekly and continue to work on nutritional and behavioral strategies to promote weight loss.  .  Return in about 4 weeks (around 10/07/2023).[name] Aas She was informed of the importance of frequent follow up visits to maximize her success with intensive lifestyle modifications for her multiple health conditions.  Attestation Statements:   Reviewed by clinician on day of visit: allergies, medications, problem list, medical history, surgical history, family history, social history, and previous encounter notes.   Time spent on visit including pre-visit chart review and post-visit care and charting was 25  minutes.    Swanson Farnell, PA-C

## 2023-09-09 ENCOUNTER — Encounter (INDEPENDENT_AMBULATORY_CARE_PROVIDER_SITE_OTHER): Payer: Self-pay | Admitting: Physician Assistant

## 2023-09-09 ENCOUNTER — Ambulatory Visit (INDEPENDENT_AMBULATORY_CARE_PROVIDER_SITE_OTHER): Admitting: Physician Assistant

## 2023-09-09 VITALS — BP 107/75 | HR 67 | Temp 97.9°F | Ht 68.0 in | Wt 198.0 lb

## 2023-09-09 DIAGNOSIS — E1165 Type 2 diabetes mellitus with hyperglycemia: Secondary | ICD-10-CM

## 2023-09-09 DIAGNOSIS — B372 Candidiasis of skin and nail: Secondary | ICD-10-CM | POA: Diagnosis not present

## 2023-09-09 DIAGNOSIS — E669 Obesity, unspecified: Secondary | ICD-10-CM

## 2023-09-09 DIAGNOSIS — K853 Drug induced acute pancreatitis without necrosis or infection: Secondary | ICD-10-CM

## 2023-09-09 DIAGNOSIS — Z9884 Bariatric surgery status: Secondary | ICD-10-CM

## 2023-09-09 DIAGNOSIS — E559 Vitamin D deficiency, unspecified: Secondary | ICD-10-CM

## 2023-09-09 DIAGNOSIS — E119 Type 2 diabetes mellitus without complications: Secondary | ICD-10-CM

## 2023-09-09 DIAGNOSIS — Z6830 Body mass index (BMI) 30.0-30.9, adult: Secondary | ICD-10-CM

## 2023-09-09 DIAGNOSIS — E538 Deficiency of other specified B group vitamins: Secondary | ICD-10-CM | POA: Diagnosis not present

## 2023-09-09 DIAGNOSIS — Z7985 Long-term (current) use of injectable non-insulin antidiabetic drugs: Secondary | ICD-10-CM

## 2023-09-09 MED ORDER — VITAMIN D (ERGOCALCIFEROL) 1.25 MG (50000 UNIT) PO CAPS: 50000.0000 [IU] | ORAL_CAPSULE | ORAL | 0 refills | Status: DC

## 2023-09-09 MED ORDER — CYANOCOBALAMIN 1000 MCG/ML IJ SOLN: 1000.0000 ug | INTRAMUSCULAR | 0 refills | Status: DC

## 2023-09-09 MED ORDER — "BD LUER-LOK SYRINGE 25G X 5/8"" 3 ML MISC": 1.0000 | 0 refills | Status: DC

## 2023-09-09 MED ORDER — TIRZEPATIDE 7.5 MG/0.5ML ~~LOC~~ SOAJ: 7.5000 mg | SUBCUTANEOUS | 0 refills | Status: DC

## 2023-09-09 MED ORDER — NYSTATIN 100000 UNIT/GM EX CREA: 1.0000 | TOPICAL_CREAM | Freq: Two times a day (BID) | CUTANEOUS | 0 refills | Status: AC

## 2023-09-16 ENCOUNTER — Other Ambulatory Visit (HOSPITAL_COMMUNITY)
Admission: RE | Admit: 2023-09-16 | Discharge: 2023-09-16 | Disposition: A | Discharge status: Home / Self Care | Source: Ambulatory Visit | Attending: Obstetrics & Gynecology | Admitting: Obstetrics & Gynecology

## 2023-09-16 ENCOUNTER — Ambulatory Visit (INDEPENDENT_AMBULATORY_CARE_PROVIDER_SITE_OTHER): Payer: Self-pay

## 2023-09-16 ENCOUNTER — Other Ambulatory Visit (HOSPITAL_BASED_OUTPATIENT_CLINIC_OR_DEPARTMENT_OTHER): Payer: Self-pay

## 2023-09-16 ENCOUNTER — Ambulatory Visit (HOSPITAL_BASED_OUTPATIENT_CLINIC_OR_DEPARTMENT_OTHER): Admitting: Obstetrics & Gynecology

## 2023-09-16 ENCOUNTER — Encounter (HOSPITAL_BASED_OUTPATIENT_CLINIC_OR_DEPARTMENT_OTHER): Payer: Self-pay | Admitting: Obstetrics & Gynecology

## 2023-09-16 VITALS — BP 110/52 | HR 66 | Ht 68.0 in | Wt 199.2 lb

## 2023-09-16 DIAGNOSIS — R55 Syncope and collapse: Secondary | ICD-10-CM

## 2023-09-16 DIAGNOSIS — R8290 Unspecified abnormal findings in urine: Secondary | ICD-10-CM | POA: Diagnosis not present

## 2023-09-16 DIAGNOSIS — N898 Other specified noninflammatory disorders of vagina: Secondary | ICD-10-CM | POA: Insufficient documentation

## 2023-09-16 DIAGNOSIS — Z113 Encounter for screening for infections with a predominantly sexual mode of transmission: Secondary | ICD-10-CM | POA: Diagnosis present

## 2023-09-16 DIAGNOSIS — R1030 Lower abdominal pain, unspecified: Secondary | ICD-10-CM

## 2023-09-16 DIAGNOSIS — T3695XS Adverse effect of unspecified systemic antibiotic, sequela: Secondary | ICD-10-CM

## 2023-09-16 DIAGNOSIS — Z3200 Encounter for pregnancy test, result unknown: Secondary | ICD-10-CM

## 2023-09-16 DIAGNOSIS — Z3201 Encounter for pregnancy test, result positive: Secondary | ICD-10-CM | POA: Diagnosis not present

## 2023-09-16 LAB — POCT URINALYSIS DIPSTICK
Bilirubin, UA: NEGATIVE
Glucose, UA: NEGATIVE
Nitrite, UA: NEGATIVE
Protein, UA: POSITIVE — AB
Spec Grav, UA: >=1.03 — AB (ref 1.010–1.025)
Urobilinogen, UA: 0.2 U/dL
pH, UA: 6 (ref 5.0–8.0)

## 2023-09-16 LAB — POCT URINE PREGNANCY: Preg Test, Ur: NEGATIVE

## 2023-09-16 MED ORDER — ESTRADIOL 0.1 MG/GM VA CREA
TOPICAL_CREAM | VAGINAL | 1 refills | Status: DC
  Filled 2023-09-16: qty 42.5, 30d supply, fill #0

## 2023-09-16 MED ORDER — SULFAMETHOXAZOLE-TRIMETHOPRIM 800-160 MG PO TABS
1.0000 | ORAL_TABLET | Freq: Two times a day (BID) | ORAL | 0 refills | Status: DC
  Filled 2023-09-16: qty 6, 3d supply, fill #0

## 2023-09-16 MED ORDER — FLUCONAZOLE 150 MG PO TABS
ORAL_TABLET | ORAL | 0 refills | Status: DC
  Filled 2023-09-16: qty 2, 3d supply, fill #0

## 2023-09-16 NOTE — Progress Notes (Signed)
 GYNECOLOGY  VISIT  CC:   positive home UPT, pelvic pain  HPI: 43 y.o. [id number] Legally Separated White or Caucasian female here for concerns about positive home UPT that she took this weekend.  Has an IUD and doesn't have cycles so was worried when she starting having some pain.  This was faint positive.  Not interested in pregnancy right now so feeling very anxious.  UPT here was negative.  She does have some vaginal dryness possibly due to the IUD and has some intermittent issues with yeast vaginitis.  Seeing specialist at [facility] to consider labial reduction procedure/surgery.  Having some discharge with odor today.  Has been with current partner for three years but request STI testing.    Having some dysuria as well.  No blood in urine.     Past Medical History:  Diagnosis Date   Abnormal uterine bleeding    Amenorrhea    Anemia    Anxiety    panic attacks   Back pain    Bradycardia    Chest pain    COVID 02/2020   Diabetes (HCC)    h/o   Dysmenorrhea    Endometriosis    Gallbladder problem    GERD (gastroesophageal reflux disease)    occasional - diet controlled and tums prn   HLD (hyperlipidemia)    HTN (hypertension)    IBS (irritable bowel syndrome)    Infertility, female    Insulin  resistance    Migraine without aura    Missed abortion    x 2 - both resolved without surgery   Obesity    PCOS (polycystic ovarian syndrome)    PONV (postoperative nausea and vomiting)    pe rpatient ; scopalamine patch very helpful    SVD (spontaneous vaginal delivery)    x 1   Syncope and collapse 06/21/2020   Urinary incontinence    Vitamin D  deficiency     MEDS:   Current Outpatient Medications on File Prior to Visit  Medication Sig Dispense Refill   Biotin w/ Vitamins C & E (HAIR/SKIN/NAILS PO) Take 1 tablet by mouth daily.      cyanocobalamin  (VITAMIN B12) 1000 MCG/ML injection Inject 1 mL (1,000 mcg total) into the skin every 14 (fourteen) days. 6 mL 0   esomeprazole   (NEXIUM ) 40 MG capsule Take 1 capsule (40 mg total) by mouth 2 (two) times daily before a meal. 60 capsule 6   levonorgestrel  (MIRENA ) 20 MCG/24HR IUD 1 each by Intrauterine route once.     lubiprostone  (AMITIZA ) 24 MCG capsule Take 1 capsule (24 mcg total) by mouth 2 (two) times daily with a meal. (Patient taking differently: Take 24 mcg by mouth 2 (two) times daily as needed for constipation.) 60 capsule 2   metroNIDAZOLE  (FLAGYL ) 500 MG tablet Take 1 tablet (500 mg total) by mouth 2 (two) times daily. 14 tablet 0   Multiple Vitamins-Minerals (BARIATRIC MULTIVITAMINS/IRON PO) Take 1 tablet by mouth in the morning and at bedtime.     nystatin  cream (MYCOSTATIN ) Apply 1 Application topically 2 (two) times daily. 180 g 0   Probiotic Product (PROBIOTIC ADVANCED PO) Take 1 capsule by mouth daily.      SUMAtriptan  (IMITREX ) 50 MG tablet Take 1 tablet by mouth  at onset of headache. May repeat in 2 hours if symptoms are persistent 10 tablet 2   SYRINGE-NEEDLE, DISP, 3 ML (B-D 3CC LUER-LOK SYR 25GX5/8") 25G X 5/8" 3 ML MISC Use to inject b12 every other week. 12 each  0   tirzepatide  (MOUNJARO ) 7.5 MG/0.5ML Pen Inject 7.5 mg into the skin once a week. 6 mL 0   Vitamin D , Ergocalciferol , (DRISDOL ) 1.25 MG (50000 UNIT) CAPS capsule Take 1 capsule (50,000 Units total) by mouth every 7 (seven) days. 12 capsule 0   No current facility-administered medications on file prior to visit.    ALLERGIES: Iohexol  and Pyridium  [phenazopyridine  hcl]  SH:  separated, non smoker  Review of Systems  Constitutional: Negative.   Genitourinary:  Positive for dysuria.    PHYSICAL EXAMINATION:    BP (!) 110/52 (BP Location: Left Arm, Patient Position: Sitting, Cuff Size: Normal)   Pulse 66   Ht 5\' 8"  (1.727 m)   Wt 199 lb 3.2 oz (90.4 kg)   BMI 30.29 kg/m     General appearance: alert, cooperative and appears stated age Abdomen: soft, non-tender; bowel sounds normal; no masses,  no organomegaly Lymph:  no  inguinal LAD noted  Pelvic: External genitalia:  no lesions              Urethra:  normal appearing urethra with no masses, tenderness or lesions              Bartholins and Skenes: normal                 Vagina: slightly brown discharge noted              Cervix: no lesions and IUD string noted              Bimanual Exam:  Uterus:  normal size, contour, position, consistency, mobility, non-tender              Adnexa: no mass, fullness, tenderness          Assessment/Plan: 1. Possible pregnancy (Primary) - POCT urine pregnancy is negative today so will check serum HCG  2. Lower abdominal pain - will check for UTI today - POCT Urinalysis Dipstick - Urine Culture - sulfamethoxazole -trimethoprim  (BACTRIM  DS) 800-160 MG tablet; Take 1 tablet by mouth 2 (two) times daily.  Dispense: 6 tablet; Refill: 0  3. Positive urine pregnancy test - Beta hCG quant (ref lab)  4. Abnormal urine - sulfamethoxazole -trimethoprim  (BACTRIM  DS) 800-160 MG tablet; Take 1 tablet by mouth 2 (two) times daily.  Dispense: 6 tablet; Refill: 0 - rx for diflucan  given as she has issues with yeast vaginitis often after antibiotic use  5. Routine screening for STI (sexually transmitted infection) - Cervicovaginal ancillary only( [place])

## 2023-09-17 ENCOUNTER — Ambulatory Visit: Payer: Self-pay | Admitting: Internal Medicine

## 2023-09-17 LAB — CUP PACEART REMOTE DEVICE CHECK
Date Time Interrogation Session: 20250511233143
Implantable Pulse Generator Implant Date: 20220216

## 2023-09-17 LAB — CERVICOVAGINAL ANCILLARY ONLY
Bacterial Vaginitis (gardnerella): NEGATIVE
Candida Glabrata: POSITIVE — AB
Candida Vaginitis: POSITIVE — AB
Chlamydia: NEGATIVE
Comment: NEGATIVE
Comment: NEGATIVE
Comment: NEGATIVE
Comment: NEGATIVE
Comment: NEGATIVE
Comment: NORMAL
Neisseria Gonorrhea: NEGATIVE
Trichomonas: NEGATIVE

## 2023-09-17 LAB — BETA HCG QUANT (REF LAB): hCG Quant: <1 m[IU]/mL

## 2023-09-18 ENCOUNTER — Other Ambulatory Visit (HOSPITAL_BASED_OUTPATIENT_CLINIC_OR_DEPARTMENT_OTHER): Payer: Self-pay

## 2023-09-18 ENCOUNTER — Ambulatory Visit (HOSPITAL_BASED_OUTPATIENT_CLINIC_OR_DEPARTMENT_OTHER): Payer: Self-pay | Admitting: Obstetrics & Gynecology

## 2023-09-19 LAB — URINE CULTURE

## 2023-09-25 ENCOUNTER — Other Ambulatory Visit (HOSPITAL_BASED_OUTPATIENT_CLINIC_OR_DEPARTMENT_OTHER): Payer: Self-pay | Admitting: Obstetrics & Gynecology

## 2023-09-25 ENCOUNTER — Encounter (HOSPITAL_BASED_OUTPATIENT_CLINIC_OR_DEPARTMENT_OTHER): Payer: Self-pay | Admitting: Obstetrics & Gynecology

## 2023-09-25 MED ORDER — NITROFURANTOIN MONOHYD MACRO 100 MG PO CAPS
100.0000 mg | ORAL_CAPSULE | Freq: Two times a day (BID) | ORAL | 0 refills | Status: DC
Start: 2023-09-25 — End: 2023-11-13
  Filled 2023-09-25: qty 14, 7d supply, fill #0

## 2023-09-26 ENCOUNTER — Other Ambulatory Visit (HOSPITAL_BASED_OUTPATIENT_CLINIC_OR_DEPARTMENT_OTHER): Payer: Self-pay | Admitting: Obstetrics & Gynecology

## 2023-09-26 ENCOUNTER — Other Ambulatory Visit (HOSPITAL_COMMUNITY): Payer: Self-pay

## 2023-09-26 DIAGNOSIS — T3695XS Adverse effect of unspecified systemic antibiotic, sequela: Secondary | ICD-10-CM

## 2023-10-02 NOTE — Addendum Note (Signed)
 Addended by: Edra Govern D on: 10/02/2023 12:24 PM   Modules accepted: Orders

## 2023-10-02 NOTE — Progress Notes (Signed)
 [facility] Summary Report / Loop Recorder

## 2023-10-07 ENCOUNTER — Other Ambulatory Visit (HOSPITAL_COMMUNITY): Payer: Self-pay

## 2023-10-16 ENCOUNTER — Ambulatory Visit (INDEPENDENT_AMBULATORY_CARE_PROVIDER_SITE_OTHER): Admitting: Physician Assistant

## 2023-10-17 ENCOUNTER — Ambulatory Visit (INDEPENDENT_AMBULATORY_CARE_PROVIDER_SITE_OTHER): Payer: Self-pay

## 2023-10-17 ENCOUNTER — Telehealth (INDEPENDENT_AMBULATORY_CARE_PROVIDER_SITE_OTHER): Payer: Self-pay | Admitting: *Deleted

## 2023-10-17 DIAGNOSIS — R55 Syncope and collapse: Secondary | ICD-10-CM

## 2023-10-17 LAB — CUP PACEART REMOTE DEVICE CHECK
Date Time Interrogation Session: 20250611230754
Implantable Pulse Generator Implant Date: 20220216

## 2023-10-17 NOTE — Telephone Encounter (Signed)
 PA FOR MOUNJARO  SUBMITTED VIA [employer]  Chauncey Cora (Key: [id number])  Your information has been sent to [facility].

## 2023-10-20 ENCOUNTER — Ambulatory Visit: Payer: Self-pay | Admitting: Internal Medicine

## 2023-10-21 ENCOUNTER — Ambulatory Visit: Payer: Self-pay

## 2023-10-21 NOTE — Telephone Encounter (Signed)
 Message from Plan Request Reference Number: [id number]. MOUNJARO  INJ 5MG /0.5 is approved through 10/16/2024. Your patient may now fill this prescription and it will be covered.. Authorization Expiration Date: October 16, 2024.   Patient notified via Mychart

## 2023-11-05 NOTE — Progress Notes (Signed)
 [facility] Summary Report / Loop Recorder

## 2023-11-05 NOTE — Addendum Note (Signed)
 Addended by: TAWNI DRILLING D on: 11/05/2023 12:14 PM   Modules accepted: Orders

## 2023-11-10 ENCOUNTER — Other Ambulatory Visit (INDEPENDENT_AMBULATORY_CARE_PROVIDER_SITE_OTHER): Payer: Self-pay | Admitting: Physician Assistant

## 2023-11-10 ENCOUNTER — Encounter (HOSPITAL_BASED_OUTPATIENT_CLINIC_OR_DEPARTMENT_OTHER): Payer: Self-pay | Admitting: Obstetrics & Gynecology

## 2023-11-10 DIAGNOSIS — E559 Vitamin D deficiency, unspecified: Secondary | ICD-10-CM

## 2023-11-12 NOTE — Progress Notes (Unsigned)
 SUBJECTIVE: Discussed the use of AI scribe software for clinical note transcription with the patient, who gave verbal consent to proceed.  Chief Complaint: Obesity  Interim History: She is down 6 lbs since last visit Down 51 lbs overall TBW loss of ~21%  [name] [name] is here to discuss her progress with her obesity treatment plan. She is on the Category 1 Plan and states she is following her eating plan approximately 95 % of the time. She states she is exercising walking/resistance bands 30 minutes 3 times per week. CHINYERE GALIANO is a 43 year old female with obesity, type 2 diabetes, and vitamin D  and B12 deficiency who presents for follow-up of her obesity treatment plan.  She has been on Mounjaro  7.5 mg weekly for diabetes management and has lost six pounds over the past two months. She is concerned about the slow progress despite adhering to her plan, which includes cardio exercises for 30 minutes three times a week. She questions the accuracy of her recent body composition measurements.  She experiences nausea and requests Zofran  for management. She attributes some of her nausea to stress and mentions that her blood pressure felt low earlier today. She is trying to stay hydrated using sugar-free Liquid IV packets. 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   Her energy levels are low, and she reports hair loss. She consumes 100 grams of protein daily and incorporates carbohydrates like broccoli, spinach, and occasionally potatoes into her diet. She uses [place] yogurt as a substitute for sour cream in mashed potatoes and enjoys making healthier versions of desserts using sugar substitutes.  She has not had recent lab work, including CBC, chemistries, A1c, insulin , lipids, and thyroid  function tests, and plans to have these done soon.  She is going to come back for fasting labs next week.   Pharmacotherapy: Mounjaro  7.5 mg weekly. Denies mass in neck, dysphagia,  dyspepsia, persistent hoarseness, abdominal pain, or N/V or diarrhea. Has annual eye exam. Mood is stable.    Bariatric Surgery: S/P Gastric sleeve surgery 2018- Dr. Adina Lunger  OBJECTIVE: Visit Diagnoses: Problem List Items Addressed This Visit     Generalized obesity   Relevant Medications   tirzepatide  (MOUNJARO ) 7.5 MG/0.5ML Pen   Type 2 diabetes mellitus without complication, without long-term current use of insulin  (HCC) - Primary   Relevant Medications   tirzepatide  (MOUNJARO ) 7.5 MG/0.5ML Pen   Other Relevant Orders   CMP14+EGFR   Hemoglobin A1c   Insulin , random   Lipid Panel With LDL/HDL Ratio   Vitamin D  deficiency   Relevant Medications   Vitamin D , Ergocalciferol , (DRISDOL ) 1.25 MG (50000 UNIT) CAPS capsule   Other Relevant Orders   VITAMIN D  25 Hydroxy (Vit-D Deficiency, Fractures)   B12 deficiency   Relevant Medications   cyanocobalamin  (VITAMIN B12) 1000 MCG/ML injection   Other Relevant Orders   Vitamin B12   CBC with Differential/Platelet   S/P bariatric surgery   Other Visit Diagnoses       Chronic fatigue       Relevant Orders   CBC with Differential/Platelet   TSH   T4, free   T3     BMI 29.0-29.9,adult Current BMI 29.3         Obesity Dilcia Rybarczyk is following up for obesity management. She has lost six pounds over two months, which she finds slower than expected.There is a very large shift/increase in muscle mass today and significant decrease in adipose mass, but it  is significantly greater than we expected. Will plan for follow up in 1 month to see progress and confirm shift to significant increase in lean mass.   She is status post bariatric surgery and is on Mounjaro  7.5 mg weekly. There is an increase in muscle mass and decrease in adipose mass, though scale calibration issues may affect accuracy. She is encouraged to continue weight training to enhance muscle mass and metabolism. - Continue Mounjaro  7.5 mg weekly - Encourage weight  training - Continue current dietary plan  Type 2 Diabetes Mellitus She is managing type 2 diabetes with Mounjaro  7.5 mg weekly. The role of Mounjaro  in driving metabolism towards burning adipose tissue rather than muscle was discussed. Denies mass in neck, dysphagia, dyspepsia, persistent hoarseness, abdominal pain, or vomiting  or diarrhea. Has annual eye exam. Mood is stable.  She does have some constipation occasionally and takes Amitiza  prn,  Some nausea at times and takes Zofran  prn  Lab Results  Component Value Date   HGBA1C 5.2 12/10/2022   HGBA1C 5.2 04/11/2022   HGBA1C 5.7 (H) 12/06/2021   Lab Results  Component Value Date   MICROALBUR 30 05/23/2022   LDLCALC 83 04/22/2023   CREATININE 0.90 04/25/2023   INSULIN   Date Value Ref Range Status  12/10/2022 8.2 2.6 - 24.9 uIU/mL Final  ] - Continue Mounjaro  7.5 mg weekly - Order HbA1c and insulin  levels and CMET along with other labs as indicated.  Continue Zofran  prn for occasional nausea.  She is working  on nutrition plan to decrease simple carbohydrates, increase lean proteins and exercise to promote weight loss and improve glycemic control .   Nausea She reports nausea, partially attributed to stress, and requests Zofran , preferring the sublingual form for ease of administration when nauseated. - Prescribe Zofran  sublingual for nausea  Vitamin D  and B12 Deficiency/S/P bariatric surgery/Chronic fatigue She is on vitamin D - Ergocalciferol  50,000 units once weekly and B12 supplementation- 1000 mcg injection q 14 days. No side effects with B 12. No N/V or muscle weakness with vitamin D -Ergocalciferol . She is due for a B12 level  and vitamin D  level check. She endorses fatigue which is more chronic in nature. She has not had thyroid  function checked recently.  - Order B12 level/vitamin D  levels -TSH, T 4 and T 3 levels ordered - Continue vitamin D  and B12 supplementation  Meds ordered this encounter  Medications   Vitamin  D, Ergocalciferol , (DRISDOL ) 1.25 MG (50000 UNIT) CAPS capsule    Sig: Take 1 capsule (50,000 Units total) by mouth every 7 (seven) days.    Dispense:  12 capsule    Refill:  0   tirzepatide  (MOUNJARO ) 7.5 MG/0.5ML Pen    Sig: Inject 7.5 mg into the skin once a week.    Dispense:  6 mL    Refill:  0   cyanocobalamin  (VITAMIN B12) 1000 MCG/ML injection    Sig: Inject 1 mL (1,000 mcg total) into the skin every 14 (fourteen) days.    Dispense:  6 mL    Refill:  0   ondansetron  (ZOFRAN -ODT) 4 MG disintegrating tablet    Sig: Take 1 tablet (4 mg total) by mouth every 8 (eight) hours as needed for nausea or vomiting.    Dispense:  20 tablet    Refill:  0    General Health Maintenance She is actively engaged in lifestyle modifications, including cardio and weight training, and dietary adjustments focusing on high protein and low sugar options. - Encourage continuation of  current exercise regimen and dietary modifications  Follow-up She is scheduled for follow-up appointments to monitor progress and adjust treatment as necessary. - Schedule follow-up appointment on August 11 at 8:30 AM - Schedule subsequent follow-up appointment five weeks after August 11  Vitals Temp: 98.1 F (36.7 C) BP: (!) 88/59 Pulse Rate: 76 SpO2: 100 %   Anthropometric Measurements Height: 5' 8 (1.727 m) Weight: 192 lb (87.1 kg) BMI (Calculated): 29.2 Weight at Last Visit: 198 lb Weight Lost Since Last Visit: 6 lb Weight Gained Since Last Visit: 0 Starting Weight: 243 lb Total Weight Loss (lbs): 51 lb (23.1 kg)   Body Composition  Body Fat %: 25.5 % Fat Mass (lbs): 49.2 lbs Muscle Mass (lbs): 136.2 lbs Total Body Water (lbs): 87.6 lbs Visceral Fat Rating : 5   Other Clinical Data Fasting: No Labs: No Today's Visit #: 39 Starting Date: 06/18/19     ASSESSMENT AND PLAN:  Diet: Prabhleen is currently in the action stage of change. As such, her goal is to continue with weight loss efforts.  She has agreed to Category 1 Plan.  Exercise: Merryl has been instructed to work up to a goal of 150 minutes of combined cardio and strengthening exercise per week for weight loss and overall health benefits.   Behavior Modification:  We discussed the following Behavioral Modification Strategies today: increasing lean protein intake, decreasing simple carbohydrates, increasing vegetables, increase H2O intake, increase high fiber foods, no skipping meals, meal planning and cooking strategies, avoiding temptations, and planning for success. We discussed various medication options to help Selicia with her weight loss efforts and we both agreed to continue currrent treatment plan.  Return in about 5 weeks (around 12/18/2023).SABRA She was informed of the importance of frequent follow up visits to maximize her success with intensive lifestyle modifications for her multiple health conditions.  Attestation Statements:   Reviewed by clinician on day of visit: allergies, medications, problem list, medical history, surgical history, family history, social history, and previous encounter notes.   Time spent on visit including pre-visit chart review and post-visit care and charting was 35 minutes.    Esteban Kobashigawa, PA-C

## 2023-11-13 ENCOUNTER — Encounter (INDEPENDENT_AMBULATORY_CARE_PROVIDER_SITE_OTHER): Payer: Self-pay | Admitting: Physician Assistant

## 2023-11-13 ENCOUNTER — Ambulatory Visit (INDEPENDENT_AMBULATORY_CARE_PROVIDER_SITE_OTHER): Admitting: Physician Assistant

## 2023-11-13 VITALS — BP 88/59 | HR 76 | Temp 98.1°F | Ht 68.0 in | Wt 192.0 lb

## 2023-11-13 DIAGNOSIS — E119 Type 2 diabetes mellitus without complications: Secondary | ICD-10-CM

## 2023-11-13 DIAGNOSIS — E669 Obesity, unspecified: Secondary | ICD-10-CM

## 2023-11-13 DIAGNOSIS — E538 Deficiency of other specified B group vitamins: Secondary | ICD-10-CM

## 2023-11-13 DIAGNOSIS — Z7985 Long-term (current) use of injectable non-insulin antidiabetic drugs: Secondary | ICD-10-CM

## 2023-11-13 DIAGNOSIS — R110 Nausea: Secondary | ICD-10-CM | POA: Diagnosis not present

## 2023-11-13 DIAGNOSIS — E559 Vitamin D deficiency, unspecified: Secondary | ICD-10-CM | POA: Diagnosis not present

## 2023-11-13 DIAGNOSIS — Z9884 Bariatric surgery status: Secondary | ICD-10-CM

## 2023-11-13 DIAGNOSIS — R5382 Chronic fatigue, unspecified: Secondary | ICD-10-CM

## 2023-11-13 DIAGNOSIS — Z6829 Body mass index (BMI) 29.0-29.9, adult: Secondary | ICD-10-CM

## 2023-11-13 MED ORDER — ONDANSETRON 4 MG PO TBDP: 4.0000 mg | ORAL_TABLET | Freq: Three times a day (TID) | ORAL | 0 refills | Status: AC | PRN

## 2023-11-13 MED ORDER — CYANOCOBALAMIN 1000 MCG/ML IJ SOLN: 1000.0000 ug | INTRAMUSCULAR | 0 refills | Status: DC

## 2023-11-13 MED ORDER — TIRZEPATIDE 7.5 MG/0.5ML ~~LOC~~ SOAJ: 7.5000 mg | SUBCUTANEOUS | 0 refills | Status: DC

## 2023-11-13 MED ORDER — VITAMIN D (ERGOCALCIFEROL) 1.25 MG (50000 UNIT) PO CAPS: 50000.0000 [IU] | ORAL_CAPSULE | ORAL | 0 refills | Status: DC

## 2023-11-18 ENCOUNTER — Encounter (HOSPITAL_BASED_OUTPATIENT_CLINIC_OR_DEPARTMENT_OTHER): Payer: Self-pay | Admitting: Radiology

## 2023-11-18 ENCOUNTER — Ambulatory Visit (INDEPENDENT_AMBULATORY_CARE_PROVIDER_SITE_OTHER): Payer: Self-pay

## 2023-11-18 ENCOUNTER — Ambulatory Visit (HOSPITAL_BASED_OUTPATIENT_CLINIC_OR_DEPARTMENT_OTHER)
Admission: RE | Admit: 2023-11-18 | Discharge: 2023-11-18 | Disposition: A | Discharge status: Home / Self Care | Source: Ambulatory Visit | Attending: Obstetrics & Gynecology | Admitting: Obstetrics & Gynecology

## 2023-11-18 ENCOUNTER — Ambulatory Visit: Payer: Self-pay | Admitting: Internal Medicine

## 2023-11-18 DIAGNOSIS — R55 Syncope and collapse: Secondary | ICD-10-CM | POA: Diagnosis not present

## 2023-11-18 DIAGNOSIS — Z1231 Encounter for screening mammogram for malignant neoplasm of breast: Secondary | ICD-10-CM | POA: Diagnosis present

## 2023-11-18 LAB — CUP PACEART REMOTE DEVICE CHECK
Date Time Interrogation Session: 20250713231823
Implantable Pulse Generator Implant Date: 20220216

## 2023-11-19 LAB — LIPID PANEL WITH LDL/HDL RATIO
Cholesterol, Total: 175 mg/dL (ref 100–199)
HDL: 46 mg/dL (ref >39)
LDL Chol Calc (NIH): 114 mg/dL — ABNORMAL HIGH (ref 0–99)
LDL/HDL Ratio: 2.5 ratio (ref 0.0–3.2)
Triglycerides: 81 mg/dL (ref 0–149)
VLDL Cholesterol Cal: 15 mg/dL (ref 5–40)

## 2023-11-19 LAB — CBC WITH DIFFERENTIAL/PLATELET
Basophils Absolute: 0.1 x10E3/uL (ref 0.0–0.2)
Basos: 1 %
EOS (ABSOLUTE): 0.3 x10E3/uL (ref 0.0–0.4)
Eos: 3 %
Hematocrit: 44.5 % (ref 34.0–46.6)
Hemoglobin: 14.2 g/dL (ref 11.1–15.9)
Immature Grans (Abs): 0 x10E3/uL (ref 0.0–0.1)
Immature Granulocytes: 0 %
Lymphocytes Absolute: 1.5 x10E3/uL (ref 0.7–3.1)
Lymphs: 18 %
MCH: 29.5 pg (ref 26.6–33.0)
MCHC: 31.9 g/dL (ref 31.5–35.7)
MCV: 92 fL (ref 79–97)
Monocytes Absolute: 0.5 x10E3/uL (ref 0.1–0.9)
Monocytes: 6 %
Neutrophils Absolute: 5.9 x10E3/uL (ref 1.4–7.0)
Neutrophils: 72 %
Platelets: 268 x10E3/uL (ref 150–450)
RBC: 4.82 x10E6/uL (ref 3.77–5.28)
RDW: 12.5 % (ref 11.7–15.4)
WBC: 8.2 x10E3/uL (ref 3.4–10.8)

## 2023-11-19 LAB — CMP14+EGFR
ALT: 10 IU/L (ref 0–32)
AST: 11 IU/L (ref 0–40)
Albumin: 4.2 g/dL (ref 3.9–4.9)
Alkaline Phosphatase: 68 IU/L (ref 44–121)
BUN/Creatinine Ratio: 14 (ref 9–23)
BUN: 13 mg/dL (ref 6–24)
Bilirubin Total: 0.5 mg/dL (ref 0.0–1.2)
CO2: 20 mmol/L (ref 20–29)
Calcium: 9.1 mg/dL (ref 8.7–10.2)
Chloride: 104 mmol/L (ref 96–106)
Creatinine, Ser: 0.93 mg/dL (ref 0.57–1.00)
Globulin, Total: 2.7 g/dL (ref 1.5–4.5)
Glucose: 80 mg/dL (ref 70–99)
Potassium: 4.1 mmol/L (ref 3.5–5.2)
Sodium: 138 mmol/L (ref 134–144)
Total Protein: 6.9 g/dL (ref 6.0–8.5)
eGFR: 78 mL/min/1.73 (ref >59)

## 2023-11-19 LAB — VITAMIN B12: Vitamin B-12: 839 pg/mL (ref 232–1245)

## 2023-11-19 LAB — HEMOGLOBIN A1C
Est. average glucose Bld gHb Est-mCnc: 94 mg/dL
Hgb A1c MFr Bld: 4.9 % (ref 4.8–5.6)

## 2023-11-19 LAB — INSULIN, RANDOM: INSULIN: 9.3 u[IU]/mL (ref 2.6–24.9)

## 2023-11-19 LAB — T4, FREE: Free T4: 1.21 ng/dL (ref 0.82–1.77)

## 2023-11-19 LAB — VITAMIN D 25 HYDROXY (VIT D DEFICIENCY, FRACTURES): Vit D, 25-Hydroxy: 56.1 ng/mL (ref 30.0–100.0)

## 2023-11-19 LAB — TSH: TSH: 1.38 u[IU]/mL (ref 0.450–4.500)

## 2023-11-19 LAB — T3: T3, Total: 123 ng/dL (ref 71–180)

## 2023-11-25 ENCOUNTER — Ambulatory Visit: Payer: Self-pay

## 2023-12-04 ENCOUNTER — Encounter (HOSPITAL_BASED_OUTPATIENT_CLINIC_OR_DEPARTMENT_OTHER): Payer: Self-pay | Admitting: Obstetrics & Gynecology

## 2023-12-05 ENCOUNTER — Other Ambulatory Visit (HOSPITAL_BASED_OUTPATIENT_CLINIC_OR_DEPARTMENT_OTHER): Payer: Self-pay | Admitting: Obstetrics & Gynecology

## 2023-12-09 ENCOUNTER — Other Ambulatory Visit (HOSPITAL_BASED_OUTPATIENT_CLINIC_OR_DEPARTMENT_OTHER): Payer: Self-pay | Admitting: Obstetrics & Gynecology

## 2023-12-09 ENCOUNTER — Encounter (HOSPITAL_BASED_OUTPATIENT_CLINIC_OR_DEPARTMENT_OTHER): Payer: Self-pay

## 2023-12-09 ENCOUNTER — Other Ambulatory Visit (HOSPITAL_BASED_OUTPATIENT_CLINIC_OR_DEPARTMENT_OTHER): Payer: Self-pay

## 2023-12-09 ENCOUNTER — Encounter (HOSPITAL_BASED_OUTPATIENT_CLINIC_OR_DEPARTMENT_OTHER): Payer: Self-pay | Admitting: Obstetrics & Gynecology

## 2023-12-09 ENCOUNTER — Other Ambulatory Visit (HOSPITAL_COMMUNITY)
Admission: RE | Admit: 2023-12-09 | Discharge: 2023-12-09 | Disposition: A | Discharge status: Home / Self Care | Source: Ambulatory Visit | Attending: Obstetrics & Gynecology | Admitting: Obstetrics & Gynecology

## 2023-12-09 ENCOUNTER — Ambulatory Visit (INDEPENDENT_AMBULATORY_CARE_PROVIDER_SITE_OTHER)

## 2023-12-09 VITALS — BP 99/59 | HR 70 | Ht 68.0 in | Wt 191.6 lb

## 2023-12-09 DIAGNOSIS — R300 Dysuria: Secondary | ICD-10-CM

## 2023-12-09 DIAGNOSIS — N898 Other specified noninflammatory disorders of vagina: Secondary | ICD-10-CM | POA: Insufficient documentation

## 2023-12-09 DIAGNOSIS — L292 Pruritus vulvae: Secondary | ICD-10-CM

## 2023-12-09 LAB — POCT URINALYSIS DIPSTICK
Bilirubin, UA: NEGATIVE
Glucose, UA: NEGATIVE
Ketones, UA: NEGATIVE
Nitrite, UA: NEGATIVE
Protein, UA: POSITIVE — AB
Spec Grav, UA: >=1.03 — AB (ref 1.010–1.025)
Urobilinogen, UA: 0.2 U/dL
pH, UA: 5.5 (ref 5.0–8.0)

## 2023-12-09 MED ORDER — TERCONAZOLE 0.4 % VA CREA
1.0000 | TOPICAL_CREAM | Freq: Every day | VAGINAL | 0 refills | Status: DC
  Filled 2023-12-09: qty 45, 7d supply, fill #0

## 2023-12-09 MED ORDER — NITROFURANTOIN MONOHYD MACRO 100 MG PO CAPS
100.0000 mg | ORAL_CAPSULE | Freq: Two times a day (BID) | ORAL | 0 refills | Status: DC
  Filled 2023-12-09: qty 14, 7d supply, fill #0

## 2023-12-09 MED ORDER — FLUCONAZOLE 150 MG PO TABS
150.0000 mg | ORAL_TABLET | ORAL | 0 refills | Status: AC
  Filled 2023-12-09: qty 3, 9d supply, fill #0
  Filled 2023-12-09: qty 1, 3d supply, fill #0

## 2023-12-09 NOTE — Progress Notes (Signed)
 NURSE VISIT- VAGINITIS/STD/POC  SUBJECTIVE:  [name] [name] is a 42 y.o. [id number] GYN patientfemale here for a vaginal swab for vaginitis screening.  She reports the following symptoms: vulvar itching for few days. Denies abnormal vaginal bleeding, significant pelvic pain, fever, or UTI symptoms.  OBJECTIVE:  BP (!) 99/59   Pulse 70   Ht 5' 8 (1.727 m) Comment: Reported  Wt 191 lb 9.6 oz (86.9 kg)   BMI 29.13 kg/m   Appears well, in no apparent distress  ASSESSMENT: Vaginal swab for vaginitis screening  PLAN: Self-collected vaginal probe for Bacterial Vaginosis, Yeast sent to lab Treatment: to be determined once results are received Follow-up as needed if symptoms persist/worsen, or new symptoms develop   NURSE VISIT- UTI SYMPTOMS   SUBJECTIVE:  [name] [name] is a 42 y.o. [id number] female here for UTI symptoms. She is a GYN patient. She reports dysuria and urinary urgency.  OBJECTIVE:  BP (!) 99/59   Pulse 70   Ht 5' 8 (1.727 m) Comment: Reported  Wt 191 lb 9.6 oz (86.9 kg)   BMI 29.13 kg/m   Appears well, in no apparent distress  No results found. However, due to the size of the patient record, not all encounters were searched. Please check Results Review for a complete set of results.  ASSESSMENT: GYN patient with UTI symptoms and negative nitrites  PLAN: Visit routed to or discussed with:  Rx sent today: Yes Urine culture sent Call or return to clinic prn if these symptoms worsen or fail to improve as anticipated. Follow-up: as needed

## 2023-12-10 ENCOUNTER — Ambulatory Visit (HOSPITAL_BASED_OUTPATIENT_CLINIC_OR_DEPARTMENT_OTHER): Payer: Self-pay | Admitting: Obstetrics & Gynecology

## 2023-12-10 ENCOUNTER — Other Ambulatory Visit (HOSPITAL_COMMUNITY): Payer: Self-pay

## 2023-12-10 DIAGNOSIS — N898 Other specified noninflammatory disorders of vagina: Secondary | ICD-10-CM

## 2023-12-10 LAB — CERVICOVAGINAL ANCILLARY ONLY
Bacterial Vaginitis (gardnerella): POSITIVE — AB
Candida Glabrata: NEGATIVE
Candida Vaginitis: POSITIVE — AB
Comment: NEGATIVE
Comment: NEGATIVE
Comment: NEGATIVE

## 2023-12-10 MED ORDER — METRONIDAZOLE 500 MG PO TABS
500.0000 mg | ORAL_TABLET | Freq: Two times a day (BID) | ORAL | 0 refills | Status: DC
  Filled 2023-12-10: qty 14, 7d supply, fill #0

## 2023-12-11 LAB — URINE CULTURE

## 2023-12-11 NOTE — Progress Notes (Signed)
 [facility] Summary Report / Loop Recorder

## 2023-12-15 NOTE — Progress Notes (Unsigned)
 SUBJECTIVE: Discussed the use of AI scribe software for clinical note transcription with the patient, who gave verbal consent to proceed.  Chief Complaint: Obesity  Interim History: She is down 4 lbs since her last visit Down 55 lbs overall TBW loss of 22.6%  [name] [name] is here to discuss her progress with her obesity treatment plan. She is on the Category 2 Plan and states she is following her eating plan approximately 95 % of the time. She states she is exercising 150 minutes 4-5 times per week.  [name] [name] is a 43 year old female with obesity who presents for follow-up of her obesity treatment plan.  She has a history of type 2 diabetes, hyperlipidemia, long COVID with POTS, and pancreatitis while on statin therapy. She was previously on GLP-1 therapy with Mounjaro , which was held for a period due to the statin associated pancreatitis, leading to weight regain. She has since resumed Mounjaro  without concerning side effects/no evidence for pancreatitis off statin therapy.  She experiences significant cravings for sweets and late-night snacking, which she attributes to stress and poor sleep. She wakes up every hour or two throughout the night and feels the urge to snack.   Regarding her type 2 diabetes, she does not currently check her blood sugars but is interested in using a continuous glucose monitor (CGM) through a new program at work. Her insulin  level was 9.3.  Lab Results  Component Value Date   HGBA1C 4.9 11/18/2023    She is scheduled for surgery on September 11th for a labioplasty and mons lift due to recurrent UTIs, yeast infections, and bacterial vaginosis. She experienced burning with urination and was diagnosed with these infections after a recent doctor's visit. She is apprehensive about the surgery but has made arrangements to stay close to the hospital postoperatively.  Her current medications include Mounjaro  7.5 mg weekly, and she has been using Estrace  cream for  vaginal dryness. She is cautious about the use of Estrace  due to concerns about cancer risk, although the risk is minimal with intravaginal use. She is also considering increasing her protein intake to optimize her health before surgery.  LABS were reviewed today  Pharmacotherapy: Mounjaro  7.5 mg weekly. Denies mass in neck, dysphagia, dyspepsia, persistent hoarseness, abdominal pain, or N/V or diarrhea. Has annual eye exam. Mood is stable.    Bariatric Surgery: S/P Gastric sleeve surgery 2018- Dr. Adina Lunger  OBJECTIVE: Visit Diagnoses: Problem List Items Addressed This Visit     Generalized obesity   Relevant Medications   tirzepatide  (MOUNJARO ) 10 MG/0.5ML Pen   Type 2 diabetes mellitus without complication, without long-term current use of insulin  (HCC) - Primary   Relevant Medications   tirzepatide  (MOUNJARO ) 10 MG/0.5ML Pen   Vitamin D  deficiency   B12 deficiency   S/P bariatric surgery   Other Visit Diagnoses       BMI 28.0-28.9,adult Current BMI 28.6          Obesity Obesity management with GLP-1 therapy (Mounjaro ) resumed after previous discontinuation due to pancreatitis which was determined to be related to statin therapy, not related to GLP -1 therapy. Weight loss progress with a recent reduction of four pounds. Current dose may be less effective due to tolerance, as evidenced by increased cravings and late-night snacking. - Increase Mounjaro  dosage to 10 mg to address increased cravings and late-night snacking. - Encourage continued weight monitoring and lifestyle modifications.  Type 2 diabetes mellitus Mounjaro  7.5 mg weekly with excellent A1c . Lab Results  Component Value Date   HGBA1C 4.9 11/18/2023   HGBA1C 5.2 12/10/2022   HGBA1C 5.2 04/11/2022   Lab Results  Component Value Date   MICROALBUR 30 05/23/2022   LDLCALC 114 (H) 11/18/2023   CREATININE 0.93 11/18/2023   management includes potential use of continuous glucose monitoring (CGM) for better  blood sugar control. Current blood sugar monitoring is not being performed regularly and may get some valuable information concerning how different foods effect glucose management. - Initiate continuous glucose monitoring (CGM) through work program. - Continue/refill and increase Mounjaro  to 10 mg weekly due to increased hunger/cravings and monitor closely.  Meds ordered this encounter  Medications   tirzepatide  (MOUNJARO ) 10 MG/0.5ML Pen    Sig: Inject 10 mg into the skin once a week.    Dispense:  6 mL    Refill:  0    Hyperlipidemia with statin intolerance and pancreatitis/ Non obstructive CAD Hyperlipidemia management complicated by statin intolerance due to pancreatitis. LDL levels remain elevated. Alternative lipid-lowering therapies such as Repatha may be considered in consultation with cardiology. Coronary calcium  score: 12/25/21:  IMPRESSION: 1. Coronary artery calcium  score 67.6 Agatston units. This places the patient in the 99th percentile for age and gender, suggesting high risk for future cardiac events.   2.  Mild nonobstructive CAD.  - ? Need to repeat coronary calcium  score - Consider dietary modifications, such as reducing whole egg consumption, to manage cholesterol levels. - Consult cardiology for potential alternative lipid-lowering therapies.  Recurrent urinary tract infections Recent UTI, yeast infection, and bacterial vaginosis were treated. Risks of recurrent UTIs if anatomical issues are not addressed were discussed. - Proceed with labioplasty and monslift surgery on January 16, 2024. - Consider post-surgical observation to monitor for complications.  Constipation Constipation management includes occasional use of Amitiza . Recent incident of bowel incontinence noted, possibly related to fiber intake without adequate hydration. - Resume Amitiza  as needed for constipation management. - Consider using Colace to maintain softer stools. - Ensure adequate  hydration, especially when increasing fiber intake.  Insomnia Insomnia potentially related to stress and increased workload. Sleep disturbances include frequent awakenings and late-night snacking. - Consider magnesium  supplementation to aid sleep and relaxation. - Explore protein-rich snacks like Oikos yogurt shots to manage evening hunger and improve sleep quality.  Genitourinary symptoms (vaginal dryness) Vaginal dryness managed with Estrace  cream. Concerns about cancer risk discussed, with reassurance provided regarding the low risk associated with intravaginal use. Recent literature supports negligible risk of breast cancer with intravaginal estradiol  use. - Continue Estrace  cream as needed for vaginal dryness. - Monitor for any genitourinary symptoms and adjust treatment as necessary.   General Health Maintenance General health maintenance includes monitoring of various health parameters and lifestyle modifications to support overall well-being. - Encourage regular exercise to improve HDL levels. - Continue routine screenings such as mammograms and Pap smears.  Vitals Temp: 98.2 F (36.8 C) BP: (!) 90/59 Pulse Rate: 73 SpO2: 98 %   Anthropometric Measurements Height: 5' 8 (1.727 m) Weight: 188 lb (85.3 kg) BMI (Calculated): 28.59 Weight at Last Visit: 198 lb Weight Lost Since Last Visit: 4 lb Weight Gained Since Last Visit: 0 Starting Weight: 243 lb Total Weight Loss (lbs): 55 lb (24.9 kg)   Body Composition  Body Fat %: 37.3 % Fat Mass (lbs): 70.2 lbs Muscle Mass (lbs): 112.2 lbs Total Body Water (lbs): 84.2 lbs Visceral Fat Rating : 7   Other Clinical Data Fasting: no Labs: no Today's Visit #: 40 Starting Date:  06/18/19     ASSESSMENT AND PLAN:  Diet: [name] [name] is currently in the action stage of change. As such, her goal is to continue with weight loss efforts. She has agreed to Category 2 Plan.  Exercise: [name] [name] has been instructed to continue  exercising as is for weight loss and overall health benefits.   Behavior Modification:  We discussed the following Behavioral Modification Strategies today: increasing lean protein intake, decreasing simple carbohydrates, increasing vegetables, increase H2O intake, increase high fiber foods, no skipping meals, meal planning and cooking strategies, avoiding temptations, and planning for success. We discussed various medication options to help [name] [name] with her weight loss efforts and we both agreed to increase Mounjaro  to 10 mg weekly.  Return in about 4 weeks (around 01/13/2024).[name] [name] She was informed of the importance of frequent follow up visits to maximize her success with intensive lifestyle modifications for her multiple health conditions.  Attestation Statements:   Reviewed by clinician on day of visit: allergies, medications, problem list, medical history, surgical history, family history, social history, and previous encounter notes.   Time spent on visit including pre-visit chart review and post-visit care and charting was 43 minutes.    Quron Ruddy, PA-C

## 2023-12-16 ENCOUNTER — Ambulatory Visit (INDEPENDENT_AMBULATORY_CARE_PROVIDER_SITE_OTHER): Admitting: Physician Assistant

## 2023-12-16 ENCOUNTER — Encounter (INDEPENDENT_AMBULATORY_CARE_PROVIDER_SITE_OTHER): Payer: Self-pay | Admitting: Physician Assistant

## 2023-12-16 VITALS — BP 90/59 | HR 73 | Temp 98.2°F | Ht 68.0 in | Wt 188.0 lb

## 2023-12-16 DIAGNOSIS — G4700 Insomnia, unspecified: Secondary | ICD-10-CM

## 2023-12-16 DIAGNOSIS — N390 Urinary tract infection, site not specified: Secondary | ICD-10-CM | POA: Diagnosis not present

## 2023-12-16 DIAGNOSIS — R399 Unspecified symptoms and signs involving the genitourinary system: Secondary | ICD-10-CM | POA: Diagnosis not present

## 2023-12-16 DIAGNOSIS — E669 Obesity, unspecified: Secondary | ICD-10-CM

## 2023-12-16 DIAGNOSIS — Z9884 Bariatric surgery status: Secondary | ICD-10-CM

## 2023-12-16 DIAGNOSIS — I2510 Atherosclerotic heart disease of native coronary artery without angina pectoris: Secondary | ICD-10-CM

## 2023-12-16 DIAGNOSIS — K5909 Other constipation: Secondary | ICD-10-CM

## 2023-12-16 DIAGNOSIS — K853 Drug induced acute pancreatitis without necrosis or infection: Secondary | ICD-10-CM

## 2023-12-16 DIAGNOSIS — E538 Deficiency of other specified B group vitamins: Secondary | ICD-10-CM

## 2023-12-16 DIAGNOSIS — E1169 Type 2 diabetes mellitus with other specified complication: Secondary | ICD-10-CM

## 2023-12-16 DIAGNOSIS — Z6828 Body mass index (BMI) 28.0-28.9, adult: Secondary | ICD-10-CM

## 2023-12-16 DIAGNOSIS — E119 Type 2 diabetes mellitus without complications: Secondary | ICD-10-CM

## 2023-12-16 DIAGNOSIS — Z72820 Sleep deprivation: Secondary | ICD-10-CM

## 2023-12-16 DIAGNOSIS — E559 Vitamin D deficiency, unspecified: Secondary | ICD-10-CM

## 2023-12-16 DIAGNOSIS — Z7985 Long-term (current) use of injectable non-insulin antidiabetic drugs: Secondary | ICD-10-CM

## 2023-12-16 DIAGNOSIS — E785 Hyperlipidemia, unspecified: Secondary | ICD-10-CM

## 2023-12-16 MED ORDER — TIRZEPATIDE 10 MG/0.5ML ~~LOC~~ SOAJ: 10.0000 mg | SUBCUTANEOUS | 0 refills | Status: DC

## 2023-12-18 LAB — CUP PACEART REMOTE DEVICE CHECK
Date Time Interrogation Session: 20250813231217
Implantable Pulse Generator Implant Date: 20220216

## 2023-12-19 ENCOUNTER — Telehealth: Payer: Self-pay

## 2023-12-19 ENCOUNTER — Ambulatory Visit (INDEPENDENT_AMBULATORY_CARE_PROVIDER_SITE_OTHER): Payer: Self-pay

## 2023-12-19 ENCOUNTER — Ambulatory Visit: Payer: Self-pay | Admitting: Internal Medicine

## 2023-12-19 DIAGNOSIS — R55 Syncope and collapse: Secondary | ICD-10-CM | POA: Diagnosis not present

## 2023-12-19 NOTE — Telephone Encounter (Signed)
 The patient called back to the [facility].  Advised her that we were following up on 2 symptom activations from 11/29/23 at 0826 & 11/21/23 @ 1915.  The patient is aware these correlated with ST. After further discussion with the patient, she recalled that she was working from home on these dates and her air was not working appropriately- the occurred during our most recent heat wave.   The patient states she has also been having low blood pressure.   I advised the patient if she was having low blood pressure during the dates/ episodes above, this may have been due to dehydration.    The patient states she was seen by her PCP on Monday and her BP was 90/56.  She is not currently on any BP/ HR lowering medications.   I advised the patient to make sure she is staying hydrated and to call the office back/ send a transmission as needed for further concerns.   Otherwise, we will continue to monitor her monthly transmissions.   The patient voices understanding of the above and is agreeable.

## 2023-12-19 NOTE — Telephone Encounter (Signed)
 ILR summary report received. Battery status OK. Normal device function. No new tachy, brady, or pause episodes. No new AF episodes.   2 new symptom events, most recent on 11/29/23 at 08:26, EGMs c/w SR with up to 5 min of ST up to 150 bpm, symptoms include dizzy, lightheaded, heart fluttering and other.   LM on VM for patient to call back and discuss symptoms and recorded events.

## 2023-12-20 ENCOUNTER — Other Ambulatory Visit (HOSPITAL_COMMUNITY): Payer: Self-pay

## 2023-12-30 ENCOUNTER — Ambulatory Visit: Payer: Self-pay

## 2024-01-13 ENCOUNTER — Encounter (INDEPENDENT_AMBULATORY_CARE_PROVIDER_SITE_OTHER): Payer: Self-pay | Admitting: Physician Assistant

## 2024-01-13 ENCOUNTER — Ambulatory Visit (INDEPENDENT_AMBULATORY_CARE_PROVIDER_SITE_OTHER): Admitting: Physician Assistant

## 2024-01-13 VITALS — BP 99/69 | HR 77 | Temp 98.3°F | Ht 68.0 in | Wt 181.0 lb

## 2024-01-13 DIAGNOSIS — Z7985 Long-term (current) use of injectable non-insulin antidiabetic drugs: Secondary | ICD-10-CM

## 2024-01-13 DIAGNOSIS — Z6827 Body mass index (BMI) 27.0-27.9, adult: Secondary | ICD-10-CM

## 2024-01-13 DIAGNOSIS — E559 Vitamin D deficiency, unspecified: Secondary | ICD-10-CM | POA: Diagnosis not present

## 2024-01-13 DIAGNOSIS — E119 Type 2 diabetes mellitus without complications: Secondary | ICD-10-CM

## 2024-01-13 DIAGNOSIS — E669 Obesity, unspecified: Secondary | ICD-10-CM | POA: Diagnosis not present

## 2024-01-13 DIAGNOSIS — E538 Deficiency of other specified B group vitamins: Secondary | ICD-10-CM

## 2024-01-13 DIAGNOSIS — Z9884 Bariatric surgery status: Secondary | ICD-10-CM

## 2024-01-13 NOTE — Progress Notes (Signed)
 SUBJECTIVE: Discussed the use of AI scribe software for clinical note transcription with the patient, who gave verbal consent to proceed.  Chief Complaint: Obesity  Interim History: She is down 7 lbs since her last visit.  Down 62 lbs overall TBW loss of 25.5%  Down 175 lbs from peak weight of 356 lbs  TBW loss of 49.2 % !!!!  [name] [name] is here to discuss her progress with her obesity treatment plan. She is on the Category 2 Plan and states she is following her eating plan approximately 95 % of the time. She states she is exercising cardio/resistance training 30 minutes 4 times per week.  [name] [name] is a 43 year old female with obesity who presents for follow-up on her obesity treatment plan.  She has a history of type 2 diabetes, hyperlipidemia (with statin intolerance due to a past episode of pancreatitis), long COVID, and vitamin D  and B12 deficiencies. She is preparing for surgery and has been off her medication Mounjaro  for a week due to the upcoming procedure. Her BMI is currently 27.5, and she has lost approximately 175 pounds from her peak weight of 356 pounds.  She experiences recurrent urinary tract infections and recently completed a course of Macrobid , which successfully treated her latest infection. She is concerned about the frequency of these infections and their impact on her quality of life.  She is concerned about potential seroma formation after her upcoming surgery. She is arranging for necessary supplies and support at home.  Her family history includes her father, who has congestive heart failure and is undergoing a procedure to investigate right-sided heart issues. He has a history of diabetes, high blood pressure, high cholesterol, and exposure to [name] [name].  Pharmacotherapy: Mounjaro  10 mg weekly. Denies mass in neck, dysphagia, dyspepsia, persistent hoarseness, abdominal pain, or N/V or diarrhea. Has annual eye exam. Mood is stable.    Bariatric Surgery:  S/P Gastric sleeve surgery 2018- Dr. Adina [name]  OBJECTIVE: Visit Diagnoses: Problem List Items Addressed This Visit     Generalized obesity   Type 2 diabetes mellitus without complication, without long-term current use of insulin  (HCC) - Primary   Vitamin D  deficiency   B12 deficiency   S/P bariatric surgery   Other Visit Diagnoses       BMI 27.0-27.9,adult Current BMI 27.5         Obesity management  Obesity management in a post-bariatric surgery patient with a BMI of 27.5. Current weight loss of 62 pounds, and total of 170+ pounds from peak weight.  Concerns about maintaining weight loss and managing body composition post-surgery. Temporary cessation of Mounjaro  due to upcoming surgery, with plans to resume post-operatively.  - Resume Mounjaro  10 mg weekly post-surgery to maintain blood sugar control. - Ensure adequate protein intake for healing and muscle maintenance. - Encourage use of protein supplements like Juven if needed. - Monitor body composition and overall health rather than focusing solely on weight. - Encourage light exercise as tolerated/permitted post-surgery to maintain mobility.  Type 2 diabetes mellitus Type 2 diabetes mellitus with current good blood sugar control. Temporary cessation of Mounjaro  due to surgery, with plans to resume post-operatively. Lab Results  Component Value Date   HGBA1C 4.9 11/18/2023   HGBA1C 5.2 12/10/2022   HGBA1C 5.2 04/11/2022   Lab Results  Component Value Date   MICROALBUR 30 05/23/2022   LDLCALC 114 (H) 11/18/2023   CREATININE 0.93 11/18/2023   She is working  on nutrition plan to decrease  simple carbohydrates, increase lean proteins and exercise to promote weight loss and improve glycemic control . - Resume Mounjaro  post-surgery to maintain blood sugar control. No refill needed this visit.    Vitamin D  deficiency Vitamin D  deficiency with temporary cessation of supplementation due to upcoming surgery. Plan to resume  post-operatively to support healing and overall health. Last vitamin D  Lab Results  Component Value Date   VD25OH 56.1 11/18/2023   On Ergocalciferol  50000 units once weekly. No N/V or muscle weakness with Ergocalciferol  Low vitamin D  levels can be associated with adiposity and may result in leptin resistance and weight gain. Also associated with fatigue.  Currently on vitamin D  supplementation without any adverse effects such as nausea, vomiting or muscle weakness.  - Resume vitamin D  supplementation- Ergocalciferol  50000 units once weekl post-surgery.  Vitamin B12 deficiency Vitamin B12 deficiency with temporary cessation of supplementation due to upcoming surgery. Plan to resume post-operatively to support healing and overall health. - Resume vitamin B12 supplementation post-surgery.  Vitals Temp: 98.3 F (36.8 C) BP: 99/69 Pulse Rate: 77 SpO2: 100 %   Anthropometric Measurements Height: 5' 8 (1.727 m) Weight: 181 lb (82.1 kg) BMI (Calculated): 27.53 Weight at Last Visit: 188lb Weight Lost Since Last Visit: 7lb Weight Gained Since Last Visit: 0 Starting Weight: 243lb Total Weight Loss (lbs): 62 lb (28.1 kg)   Body Composition  Body Fat %: 22.7 % Fat Mass (lbs): 41.2 lbs Muscle Mass (lbs): 133.2 lbs Total Body Water (lbs): 84.2 lbs Visceral Fat Rating : 4   Other Clinical Data Fasting: no Labs: no Today's Visit #: 41 Starting Date: 06/18/19     ASSESSMENT AND PLAN:  Diet: Brent is currently in the action stage of change. As such, her goal is to continue with weight loss efforts. She has agreed to Category 2 Plan.  Exercise: Lorean has been instructed to continue exercising as is and then exercise only if cleared by your surgeon after surgery 01/16/24 for weight loss and overall health benefits.   Behavior Modification:  We discussed the following Behavioral Modification Strategies today: increasing lean protein intake, decreasing simple carbohydrates,  increasing vegetables, increase H2O intake, increase high fiber foods, no skipping meals, meal planning and cooking strategies, avoiding temptations, planning for success, and post surgery strategies . We discussed various medication options to help Mckynlee with her weight loss efforts and we both agreed to continue current treatment plan.  Return in about 4 weeks (around 02/10/2024).SABRA She was informed of the importance of frequent follow up visits to maximize her success with intensive lifestyle modifications for her multiple health conditions.  Attestation Statements:   Reviewed by clinician on day of visit: allergies, medications, problem list, medical history, surgical history, family history, social history, and previous encounter notes.   Time spent on visit including pre-visit chart review and post-visit care and charting was 38 minutes.    [name] Tuckett, PA-C

## 2024-01-18 ENCOUNTER — Other Ambulatory Visit (HOSPITAL_COMMUNITY): Payer: Self-pay

## 2024-01-18 MED ORDER — GABAPENTIN 100 MG PO CAPS
100.0000 mg | ORAL_CAPSULE | ORAL | 0 refills | Status: AC
  Filled 2024-01-18: qty 21, 7d supply, fill #0

## 2024-01-18 MED ORDER — OXYCODONE HCL 5 MG PO TABS
5.0000 mg | ORAL_TABLET | ORAL | 0 refills | Status: AC | PRN
  Filled 2024-01-18 – 2024-01-20 (×2): qty 5, 1d supply, fill #0

## 2024-01-18 MED ORDER — CYCLOBENZAPRINE HCL 10 MG PO TABS
10.0000 mg | ORAL_TABLET | Freq: Three times a day (TID) | ORAL | 0 refills | Status: AC
  Filled 2024-01-18: qty 21, 7d supply, fill #0

## 2024-01-18 MED ORDER — ONDANSETRON 4 MG PO TBDP
4.0000 mg | ORAL_TABLET | Freq: Three times a day (TID) | ORAL | 0 refills | Status: AC | PRN
  Filled 2024-01-18: qty 8, 3d supply, fill #0

## 2024-01-19 ENCOUNTER — Other Ambulatory Visit (HOSPITAL_COMMUNITY): Payer: Self-pay

## 2024-01-20 ENCOUNTER — Ambulatory Visit (INDEPENDENT_AMBULATORY_CARE_PROVIDER_SITE_OTHER): Payer: Self-pay

## 2024-01-20 ENCOUNTER — Other Ambulatory Visit (HOSPITAL_COMMUNITY): Payer: Self-pay

## 2024-01-20 ENCOUNTER — Other Ambulatory Visit: Payer: Self-pay

## 2024-01-20 DIAGNOSIS — R55 Syncope and collapse: Secondary | ICD-10-CM

## 2024-01-20 MED ORDER — OXYCODONE HCL 5 MG PO TABS
5.0000 mg | ORAL_TABLET | ORAL | 0 refills | Status: AC
  Filled 2024-01-20 – 2024-01-22 (×2): qty 10, 2d supply, fill #0

## 2024-01-21 ENCOUNTER — Other Ambulatory Visit (INDEPENDENT_AMBULATORY_CARE_PROVIDER_SITE_OTHER): Payer: Self-pay | Admitting: Physician Assistant

## 2024-01-21 DIAGNOSIS — E559 Vitamin D deficiency, unspecified: Secondary | ICD-10-CM

## 2024-01-21 LAB — CUP PACEART REMOTE DEVICE CHECK
Date Time Interrogation Session: 20250913230613
Implantable Pulse Generator Implant Date: 20220216

## 2024-01-22 ENCOUNTER — Other Ambulatory Visit (HOSPITAL_COMMUNITY): Payer: Self-pay

## 2024-01-24 ENCOUNTER — Ambulatory Visit: Payer: Self-pay | Admitting: Internal Medicine

## 2024-01-24 ENCOUNTER — Other Ambulatory Visit (HOSPITAL_COMMUNITY): Payer: Self-pay

## 2024-01-24 MED ORDER — OXYCODONE HCL 5 MG PO TABS
5.0000 mg | ORAL_TABLET | ORAL | 0 refills | Status: AC
  Filled 2024-01-24: qty 20, 4d supply, fill #0

## 2024-01-27 NOTE — Progress Notes (Signed)
 Remote Loop Recorder Transmission

## 2024-01-29 NOTE — Progress Notes (Signed)
 Remote Loop Recorder Transmission

## 2024-01-31 ENCOUNTER — Other Ambulatory Visit (HOSPITAL_COMMUNITY): Payer: Self-pay

## 2024-01-31 MED ORDER — GABAPENTIN 100 MG PO CAPS
100.0000 mg | ORAL_CAPSULE | Freq: Three times a day (TID) | ORAL | 0 refills | Status: AC
  Filled 2024-01-31: qty 42, 14d supply, fill #0

## 2024-02-03 ENCOUNTER — Ambulatory Visit: Payer: Self-pay

## 2024-02-09 NOTE — Progress Notes (Unsigned)
 TeleHealth Visit:  This visit was completed with telemedicine (audio/video) technology. [name] has verbally consented to this TeleHealth visit. The patient is located at home, the provider is located at the [facility] office in [place].   The participants in this visit include the listed provider and patient. The visit was conducted today via MyChart video.  OBESITY [name] [name] is here to discuss her progress with her obesity treatment plan along with follow-up of her obesity related diagnoses.   Today's visit was # 42 Starting weight: 243 lbs Starting date: 06/18/19 Weight at last in office visit: 181 lbs on 01/13/24 Total weight loss: 62 lbs at last in office visit on 01/13/24. Today's reported weight (181 lbs): 181 lbs  Nutrition Plan: the Category 2 plan - 50-75% adherence.  Current exercise: 2 lb weight lifting restriction and limited walking after recent surgery  Interim History:  [name] [name] is a 43 year old female who presents for a postoperative video visit following recent surgery.  She is recovering from recent surgery and has experienced complications, including a wound on her left side of her perineum that opened and bled heavily after overexerting herself by walking too much. She is now resting and taking it 'one day at a time.'  Her current medications include Mounjaro  10 mg weekly, ergocalciferol  50,000 units once weekly, vitamin B12 1,000 microgram injection every 14 days, and biotin one tablet daily. She is also on muscle relaxers and gabapentin , which cause drowsiness and affect her appetite.  Nutritionally, she is focusing on high protein intake with protein shots and Juven protein shakes, while avoiding simple carbohydrates. Meals are light due to early satiety, especially when lying down. She experiences nausea and occasionally drinks zero sugar ginger ale. Her fluid intake is approximately 50 ounces of water and 20-30 ounces of ginger ale daily.  She reports  difficulty with bowel movements, initially requiring suppositories due to hard stools. Her last bowel movement was 'between mush to liquid.' She is cautious with bowel movements due to stitches extending from the front to the rectum, which could be affected by tearing or infection.  Physical activity is limited; she can only lift up to two pounds and requires assistance with showers. Her legs feel weak, and she experiences muscle loss and shaking when standing. She is trying to maintain some activity, but her physical activity is limited due to her current condition.  She gained 13 pounds of fluid post-surgery due to IV solutions and increased food intake in the hospital but has since returned to her pre-surgery weight. She monitors her weight twice a week. Protein intake is as prescribed, Is not drinking sugar sweetened beverages., Is not skipping meals, Meeting protein goals., Water intake is adequate., Denies polyphagia, and Denies excessive cravings.  Eating all of the prescribed protein: yes - with supplementation currently following recent surgery Skipping meals: No Drinking adequate water: Yes Drinking sugar sweetened beverages: No Hunger controlled: well controlled. Cravings controlled:  well controlled.   Pharmacotherapy: Mirel is on Mounjaro  10 mg SQ weekly Adverse side effects: Constipation Hunger is well controlled.  Cravings are well controlled.  Assessment/Plan:  1. Postoperative wound dehiscence with bleeding, left side Postoperative wound dehiscence on the left side with significant bleeding, likely exacerbated by increased physical activity. - Advise rest and limited physical activity to promote healing  Postoperative pain management Postoperative pain managed with gabapentin  and Flexeril , causing drowsiness. - Continue current pain management regimen - Reassess medication regimen at follow-up in three weeks with surgery provider  Constipation after  surgery Post-surgical constipation with initial hard stools requiring suppositories. Current bowel movements are infrequent and range from mushy to liquid. Contributing factors include high protein intake and limited fiber. - Increase hydration to improve bowel function - Consider over-the-counter Colace to soften stools - Advise on potential use of magnesium  citrate, starting with half a bottle if needed - Encourage intake of fiber-rich foods like apples and berries She has amitiza  for prn use as well  Deconditioning after surgery Deconditioning with significant muscle mass loss in legs and difficulty with physical activities. Limited to lifting no more than two pounds due to surgical restrictions. - Encourage light activity within current restrictions to maintain muscle mass - Focus on high protein intake to support muscle maintenance and recovery  Obesity status post bariatric surgery Status post bariatric surgery. Recent surgery for excess skin removal of mons and perineum.  - Continue monitoring weight bi-weekly at home and monthly in clinic to maintain muscle mass - Maintain high protein diet to support healing and muscle maintenance  Type 2 diabetes mellitus Type 2 diabetes managed with Mounjaro  (tirzepatide ) without complications. Denies mass in neck, dysphagia, dyspepsia, persistent hoarseness, abdominal pain, or N/V/Constipation or diarrhea. Has annual eye exam. Mood is stable.  Constipation issues post op as noted above and management as noted.  Lab Results  Component Value Date   HGBA1C 4.9 11/18/2023   HGBA1C 5.2 12/10/2022   HGBA1C 5.2 04/11/2022   Lab Results  Component Value Date   MICROALBUR 30 05/23/2022   LDLCALC 114 (H) 11/18/2023   CREATININE 0.93 11/18/2023   She is working  on nutrition plan to decrease simple carbohydrates, increase lean proteins and exercise to promote weight loss and improve glycemic control . - Continue/refill Mounjaro  10 mg weekly Meds  ordered this encounter  Medications   cyanocobalamin  (VITAMIN B12) 1000 MCG/ML injection    Sig: Inject 1 mL (1,000 mcg total) into the skin every 14 (fourteen) days.    Dispense:  6 mL    Refill:  0   tirzepatide  (MOUNJARO ) 10 MG/0.5ML Pen    Sig: Inject 10 mg into the skin once a week.    Dispense:  6 mL    Refill:  0   Vitamin D , Ergocalciferol , (DRISDOL ) 1.25 MG (50000 UNIT) CAPS capsule    Sig: Take 1 capsule (50,000 Units total) by mouth every 7 (seven) days.    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 - Continue ergocalciferol  50,000 units once weekly - Continue vitamin B12 1,000 mcg injection every 14 days  Hyperlipidemia Hyperlipidemia with previous statin-induced pancreatitis.  Generalized Obesity: Current BMI 27.5  Pharmacotherapy Plan Continue and refill  Mounjaro  10 mg SQ weekly  Christella is currently in the action stage of change. As such, her goal is to continue with weight loss efforts.  She has agreed to the Category 2 plan.  Exercise goals: exercise as able/directed by surgeon post op  Behavioral modification strategies: increasing lean protein intake, decreasing simple carbohydrates , no meal skipping, increase water intake, planning for success, increasing fiber rich foods, avoiding temptations, and mindful eating.  Ladye has agreed to follow-up with our clinic in 4 weeks.  No orders of the defined types were placed in this encounter.   Medications Discontinued During This Encounter  Medication Reason   Vitamin D , Ergocalciferol , (DRISDOL ) 1.25 MG (50000  UNIT) CAPS capsule Reorder   cyanocobalamin  (VITAMIN B12) 1000 MCG/ML injection Reorder   tirzepatide  (MOUNJARO ) 10 MG/0.5ML Pen Reorder     Meds ordered this encounter  Medications   cyanocobalamin  (VITAMIN B12) 1000 MCG/ML injection    Sig: Inject 1 mL (1,000 mcg  total) into the skin every 14 (fourteen) days.    Dispense:  6 mL    Refill:  0   tirzepatide  (MOUNJARO ) 10 MG/0.5ML Pen    Sig: Inject 10 mg into the skin once a week.    Dispense:  6 mL    Refill:  0   Vitamin D , Ergocalciferol , (DRISDOL ) 1.25 MG (50000 UNIT) CAPS capsule    Sig: Take 1 capsule (50,000 Units total) by mouth every 7 (seven) days.    Dispense:  12 capsule    Refill:  0      Objective:   VITALS: Per patient if applicable, see vitals. GENERAL: Alert and in no acute distress. CARDIOPULMONARY: No increased WOB. Speaking in clear sentences.  PSYCH: Pleasant and cooperative. Speech normal rate and rhythm. Affect is appropriate. Insight and judgement are appropriate. Attention is focused, linear, and appropriate.  NEURO: Oriented as arrived to appointment on time with no prompting.   Attestation Statements:   Reviewed by clinician on day of visit: allergies, medications, problem list, medical history, surgical history, family history, social history, and previous encounter notes.   Time spent on visit including the items listed below was 35 minutes.  -preparing to see the patient (e.g., review of tests, history, previous notes) -obtaining and/or reviewing separately obtained history -counseling and educating the patient/family/caregiver -documenting clinical information in the electronic or other health record -ordering medications, tests, or procedures -independently interpreting results and communicating results to the patient/ family/caregiver -referring and communicating with other health care professionals  -care coordination   This was prepared with the assistance of Engineer, civil (consulting).  Occasional wrong-word or sound-a-like substitutions may have occurred due to the inherent limitations of voice recognition software.  Sinjin Amero,PA-C

## 2024-02-10 ENCOUNTER — Encounter (INDEPENDENT_AMBULATORY_CARE_PROVIDER_SITE_OTHER): Payer: Self-pay | Admitting: Physician Assistant

## 2024-02-10 ENCOUNTER — Telehealth (INDEPENDENT_AMBULATORY_CARE_PROVIDER_SITE_OTHER): Admitting: Physician Assistant

## 2024-02-10 ENCOUNTER — Other Ambulatory Visit (HOSPITAL_COMMUNITY): Payer: Self-pay

## 2024-02-10 VITALS — HR 62 | Temp 97.1°F | Ht 68.0 in | Wt 181.0 lb

## 2024-02-10 DIAGNOSIS — K5909 Other constipation: Secondary | ICD-10-CM | POA: Diagnosis not present

## 2024-02-10 DIAGNOSIS — E559 Vitamin D deficiency, unspecified: Secondary | ICD-10-CM | POA: Diagnosis not present

## 2024-02-10 DIAGNOSIS — E119 Type 2 diabetes mellitus without complications: Secondary | ICD-10-CM

## 2024-02-10 DIAGNOSIS — R5381 Other malaise: Secondary | ICD-10-CM

## 2024-02-10 DIAGNOSIS — E785 Hyperlipidemia, unspecified: Secondary | ICD-10-CM | POA: Diagnosis not present

## 2024-02-10 DIAGNOSIS — Z6827 Body mass index (BMI) 27.0-27.9, adult: Secondary | ICD-10-CM

## 2024-02-10 DIAGNOSIS — Z9884 Bariatric surgery status: Secondary | ICD-10-CM

## 2024-02-10 DIAGNOSIS — E669 Obesity, unspecified: Secondary | ICD-10-CM

## 2024-02-10 DIAGNOSIS — E1169 Type 2 diabetes mellitus with other specified complication: Secondary | ICD-10-CM

## 2024-02-10 DIAGNOSIS — E538 Deficiency of other specified B group vitamins: Secondary | ICD-10-CM

## 2024-02-10 DIAGNOSIS — Z7985 Long-term (current) use of injectable non-insulin antidiabetic drugs: Secondary | ICD-10-CM

## 2024-02-10 MED ORDER — VITAMIN D (ERGOCALCIFEROL) 1.25 MG (50000 UNIT) PO CAPS: 50000.0000 [IU] | ORAL_CAPSULE | ORAL | 0 refills | Status: DC

## 2024-02-10 MED ORDER — CYANOCOBALAMIN 1000 MCG/ML IJ SOLN: 1000.0000 ug | INTRAMUSCULAR | 0 refills | Status: DC

## 2024-02-10 MED ORDER — TIRZEPATIDE 10 MG/0.5ML ~~LOC~~ SOAJ: 10.0000 mg | SUBCUTANEOUS | 0 refills | Status: DC

## 2024-02-13 NOTE — Progress Notes (Signed)
 Remote Loop Recorder Transmission

## 2024-02-20 ENCOUNTER — Ambulatory Visit: Payer: Self-pay

## 2024-02-21 ENCOUNTER — Ambulatory Visit (INDEPENDENT_AMBULATORY_CARE_PROVIDER_SITE_OTHER): Payer: Self-pay

## 2024-02-21 DIAGNOSIS — R55 Syncope and collapse: Secondary | ICD-10-CM | POA: Diagnosis not present

## 2024-02-23 LAB — CUP PACEART REMOTE DEVICE CHECK
Date Time Interrogation Session: 20251016230843
Implantable Pulse Generator Implant Date: 20220216

## 2024-02-24 ENCOUNTER — Telehealth: Payer: Self-pay

## 2024-02-24 NOTE — Telephone Encounter (Signed)
 ILR summary report received. Battery status OK. Normal device function. No new tachy, brady, or pause episodes. No new AF episodes. 13 new symptom episodes, most episodes c/w SR/ST, see 09/18 and 09/14 episodes, tachy rates 150s and abrupt return to SR.    Dr. Waddell to review scheduled remote. Reached out to patient to inform her that majority events were SR/ST with exception of 9/18 and 9/14 with likely atrial driven tach with abrupt return to SR.  Rates 150's. Nothing concerning, will continue to monitor.   LM on patient VM.

## 2024-02-25 NOTE — Telephone Encounter (Signed)
 Unable to reach patient to inform her that majority of events were SR/ST w/ exception of two events that occurred on 9/14 & 9/18 w/ likely atrial driven tach w/ abrupt return to SR. Nothing concerning at this time and will continue to monitor. Left voicemail to return call.

## 2024-02-25 NOTE — Progress Notes (Signed)
 Remote Loop Recorder Transmission

## 2024-02-26 ENCOUNTER — Ambulatory Visit: Payer: Self-pay | Admitting: Internal Medicine

## 2024-02-26 NOTE — Telephone Encounter (Signed)
 Spoke w/ patient regarding previous symptom activation episodes. Informed patient majority of events were SR/ST w/ exception of 01/19/2024 & 01/23/2024 which look to be atrial driven tachycardia w/ abrupt return to SR. Nothing of concern at this time. Patient is aware and appreciative of call.   Advised patient to contact device clinic if any new symptoms arise or has any further questions. Patient verbalized understanding. Will continue to monitor and update accordingly.

## 2024-03-05 ENCOUNTER — Encounter (INDEPENDENT_AMBULATORY_CARE_PROVIDER_SITE_OTHER): Payer: Self-pay | Admitting: Physician Assistant

## 2024-03-08 NOTE — Progress Notes (Unsigned)
   SUBJECTIVE: Discussed the use of AI scribe software for clinical note transcription with the patient, who gave verbal consent to proceed.  Chief Complaint: Obesity  Interim History: ***  [name] [name] is here to discuss her progress with her obesity treatment plan. She is on the {HWW Weight Loss Plan:210964005} and states she {CHL AMB IS/IS NOT:210130109} following her eating plan approximately *** % of the time. She states she {CHL AMB IS/IS NOT:210130109} exercising *** minutes *** times per week.   OBJECTIVE: Visit Diagnoses: Problem List Items Addressed This Visit     Generalized obesity   Type 2 diabetes mellitus without complication, without long-term current use of insulin  (HCC) - Primary   Vitamin D  deficiency   S/P bariatric surgery   Other Visit Diagnoses       Hyperlipidemia associated with type 2 diabetes mellitus (HCC)           No data recorded No data recorded No data recorded No data recorded   ASSESSMENT AND PLAN:  Diet: [name] [name] {CHL AMB IS/IS NOT:210130109} currently in the action stage of change. As such, her goal is to {HWW Weight Loss Efforts:210964006}. She {HAS HAS WNU:81165} agreed to {HWW Weight Loss Plan:210964005}.  Exercise: [name] [name] has been instructed {HWW Exercise:210964007} for weight loss and overall health benefits.   Behavior Modification:  We discussed the following Behavioral Modification Strategies today: {HWW Behavior Modification:210964008}. We discussed various medication options to help [name] [name] with her weight loss efforts and we both agreed to ***.  No follow-ups on file.SABRA She was informed of the importance of frequent follow up visits to maximize her success with intensive lifestyle modifications for her multiple health conditions.  Attestation Statements:   Reviewed by clinician on day of visit: allergies, medications, problem list, medical history, surgical history, family history, social history, and previous encounter  notes.   Time spent on visit including pre-visit chart review and post-visit care and charting was *** minutes.    [name] Dorminey, PA-C

## 2024-03-09 ENCOUNTER — Telehealth (INDEPENDENT_AMBULATORY_CARE_PROVIDER_SITE_OTHER): Payer: Self-pay | Admitting: Physician Assistant

## 2024-03-09 ENCOUNTER — Encounter (INDEPENDENT_AMBULATORY_CARE_PROVIDER_SITE_OTHER): Payer: Self-pay | Admitting: Physician Assistant

## 2024-03-09 VITALS — Ht 68.0 in | Wt 181.0 lb

## 2024-03-09 DIAGNOSIS — E538 Deficiency of other specified B group vitamins: Secondary | ICD-10-CM | POA: Diagnosis not present

## 2024-03-09 DIAGNOSIS — K5900 Constipation, unspecified: Secondary | ICD-10-CM | POA: Diagnosis not present

## 2024-03-09 DIAGNOSIS — Z7985 Long-term (current) use of injectable non-insulin antidiabetic drugs: Secondary | ICD-10-CM

## 2024-03-09 DIAGNOSIS — Z6827 Body mass index (BMI) 27.0-27.9, adult: Secondary | ICD-10-CM

## 2024-03-09 DIAGNOSIS — E559 Vitamin D deficiency, unspecified: Secondary | ICD-10-CM | POA: Diagnosis not present

## 2024-03-09 DIAGNOSIS — L987 Excessive and redundant skin and subcutaneous tissue: Secondary | ICD-10-CM

## 2024-03-09 DIAGNOSIS — Z9884 Bariatric surgery status: Secondary | ICD-10-CM

## 2024-03-09 DIAGNOSIS — E1169 Type 2 diabetes mellitus with other specified complication: Secondary | ICD-10-CM

## 2024-03-09 DIAGNOSIS — E1165 Type 2 diabetes mellitus with hyperglycemia: Secondary | ICD-10-CM | POA: Diagnosis not present

## 2024-03-09 DIAGNOSIS — K5909 Other constipation: Secondary | ICD-10-CM

## 2024-03-09 DIAGNOSIS — E669 Obesity, unspecified: Secondary | ICD-10-CM

## 2024-03-09 DIAGNOSIS — E119 Type 2 diabetes mellitus without complications: Secondary | ICD-10-CM

## 2024-03-23 ENCOUNTER — Ambulatory Visit: Payer: Self-pay

## 2024-03-25 ENCOUNTER — Ambulatory Visit: Attending: Internal Medicine

## 2024-03-25 DIAGNOSIS — R55 Syncope and collapse: Secondary | ICD-10-CM | POA: Diagnosis not present

## 2024-03-26 ENCOUNTER — Ambulatory Visit: Payer: Self-pay | Admitting: Internal Medicine

## 2024-03-26 LAB — CUP PACEART REMOTE DEVICE CHECK
Date Time Interrogation Session: 20251118231722
Implantable Pulse Generator Implant Date: 20220216

## 2024-03-27 NOTE — Progress Notes (Signed)
 Remote Loop Recorder Transmission

## 2024-04-05 ENCOUNTER — Encounter (INDEPENDENT_AMBULATORY_CARE_PROVIDER_SITE_OTHER): Payer: Self-pay | Admitting: Physician Assistant

## 2024-04-05 NOTE — Progress Notes (Unsigned)
 TeleHealth Visit:  This visit was completed with telemedicine (audio/video) technology. [name] [name] has verbally consented to this TeleHealth visit. The patient is located at home, the provider is located at the [facility] office in [place]. The participants in this visit include the listed provider and patient. The visit was conducted today via MyChart video.  SUBJECTIVE: Discussed the use of AI scribe software for clinical note transcription with the patient, who gave verbal consent to proceed.  Chief Complaint: Obesity  Interim History: She believes she is down about 2-3 lbs since her last visit.  She is down 62 lbs overall TBW Loss of 25.5%  Down 175 lbs from peak weight TBW loss of 49.2%  [name] [name] is here to discuss her progress with her obesity treatment plan. She is on the Category 2 Plan and states she is following her eating plan approximately 85 % of the time. She states she is not exercising as she is still recovering from recent surgery.  [name] [name] is a 43 year old female who presents for follow-up of her obesity treatment plan.  She is recovering at home from recent GYN surgery and is preparing to return to work later this week. She will be working from home, which she finds beneficial. She has been focusing on maintaining a healthy diet, especially during Thanksgiving, where she did most of the cooking and tried to avoid overeating. She has not been able to exercise much due to her surgical recovery but is allowed to sit for longer periods and is gradually increasing her activity level.   She is continuing to eat adequate protein and whole foods.  She is not always drinking enough water.  She is not skipping meals.  She has been sleeping 7 to 9 hours nightly.  She does track her calories and macros.  She has a history of type 2 diabetes and hyperlipidemia, with a noted intolerance to statins due to pancreatitis. She is currently taking Mounjaro  10 mg, with a three-month  supply last filled in October, and reports no issues with this medication. She is also on B12 injections, which she paused after surgery but resumed last week. No problems with her vitamin D  supplementation Ergocalciferol  50,000 units once weekly.  She mentions that two family members, including herself, have been experiencing fever and chills since Thanksgiving. She also notes that her voice has been coming and going and feels she has an URI.  Her new granddaughter was able to come home from the hospital and is gaining weight appropriately and she is excited for the new addition to her family.  Plan fasting IC and Labs next visit.   Pharmacotherapy: Mounjaro  10 mg weekly. Denies mass in neck, dysphagia, dyspepsia, persistent hoarseness, abdominal pain, or N/V or diarrhea. Has annual eye exam. Mood is stable.    Bariatric Surgery: S/P Gastric sleeve surgery 2018- Dr. Adina Lunger  OBJECTIVE: Visit Diagnoses: Problem List Items Addressed This Visit     Generalized obesity   Type 2 diabetes mellitus without complication, without long-term current use of insulin  (HCC) - Primary   Vitamin D  deficiency   B12 deficiency   S/P bariatric surgery   Other Visit Diagnoses       Hyperlipidemia associated with type 2 diabetes mellitus (HCC)         BMI 27.0-27.9,adult Current BMI 27.2         Obesity Management is ongoing with Mounjaro  10 mg for primary indication of T2DM.   Significant weight loss has been achieved, but  mobility is currently limited due to recent surgery. She is following a dietary plan and is unable to exercise extensively at this time. - Continue Mounjaro  10 mg - Encouraged adherence to dietary plan- Cat 2 plan - Will reassess weight management plan at next appointment with fasting IC and Labs  Type 2 diabetes mellitus Type 2 diabetes is being managed with current medications. No reported issues with current medication regimen Mounjaro  10 mg weekly. Denies mass in neck,  dysphagia, dyspepsia, persistent hoarseness, abdominal pain, or N/V/Constipation or diarrhea. Has annual eye exam. Mood is stable.  Lab Results  Component Value Date   HGBA1C 4.9 11/18/2023   HGBA1C 5.2 12/10/2022   HGBA1C 5.2 04/11/2022   Lab Results  Component Value Date   MICROALBUR 30 05/23/2022   LDLCALC 114 (H) 11/18/2023   CREATININE 0.93 11/18/2023   INSULIN   Date Value Ref Range Status  11/18/2023 9.3 2.6 - 24.9 uIU/mL Final  ]She is working  on nutrition plan to decrease simple carbohydrates, increase lean proteins and exercise to promote weight loss and improve glycemic control . Continue Mounjaro  10 mg weekly. No refills needed this visit.    Hyperlipidemia with statin intolerance Hyperlipidemia is managed without statins due to intolerance-pancreatitis which was felt to be statin induced.  Last lipids Lab Results  Component Value Date   CHOL 175 11/18/2023   HDL 46 11/18/2023   LDLCALC 114 (H) 11/18/2023   LDLDIRECT 141 (H) 09/23/2020   TRIG 81 11/18/2023   CHOLHDL 3.0 04/22/2023   Continues Mounjaro  which should also decrease CV risks.  Continue to work on nutrition plan -decreasing simple carbohydrates, increasing lean proteins, decreasing saturated fats and cholesterol , avoiding trans fats and exercise as able to promote weight loss, improve lipids and decrease cardiovascular risks. Plan to recheck lipids next visit.  - Continue current management plan without statins  Vitamin D  deficiency after bariatric surgery Last vitamin D  Lab Results  Component Value Date   VD25OH 56.1 11/18/2023   Level is at goal on supplementation.  Low vitamin D  levels can be associated with adiposity and may result in leptin resistance and weight gain. Also associated with fatigue.  Currently on vitamin D  supplementation without any adverse effects such as nausea, vomiting or muscle weakness.   - Continue vitamin D  supplementation- Ergocalciferol  50,000 units once weekly.    Deficiency of B group vitamins after bariatric surgery Managed with B12 injections 1000 mcg monthly. She resumed B12 injections post-surgery without issues. Lab Results  Component Value Date   VITAMINB12 839 11/18/2023   At goal on supplementation.  - Continue B12 injections  No data recorded Anthropometric Measurements Height: 5' 8 (1.727 m) Weight: 179 lb (81.2 kg) (Patient reported from home scale) BMI (Calculated): 27.22 Starting Weight: 243 lb Total Weight Loss (lbs): 62 lb (28.1 kg)   No data recorded Other Clinical Data Fasting: No Labs: No Today's Visit #: 44 Starting Date: 06/18/19 Comments: Mychart Video Visit     ASSESSMENT AND PLAN:  Diet: [name] [name] is currently in the action stage of change. As such, her goal is to continue with weight loss efforts. She has agreed to Category 2 Plan.  Exercise: [name] [name] has been instructed exercise when cleared by GYN surgery for weight loss and overall health benefits.   Behavior Modification:  We discussed the following Behavioral Modification Strategies today: increasing lean protein intake, decreasing simple carbohydrates, increasing vegetables, increase H2O intake, increase high fiber foods, no skipping meals, meal planning and cooking strategies, holiday  eating strategies, avoiding temptations, and planning for success. We discussed various medication options to help [name] [name] with her weight loss efforts and we both agreed to continue Mounjaro  10 mg weekly for T2DM.  Return in about 5 weeks (around 05/11/2024) for Fasting Lab, Fasting IC.[name] [name] She was informed of the importance of frequent follow up visits to maximize her success with intensive lifestyle modifications for her multiple health conditions.  Attestation Statements:   Reviewed by clinician on day of visit: allergies, medications, problem list, medical history, surgical history, family history, social history, and previous encounter notes.   Time spent on visit  including pre-visit chart review and post-visit care and charting was 31 minutes.    Tenleigh Byer, PA-C

## 2024-04-06 ENCOUNTER — Encounter (INDEPENDENT_AMBULATORY_CARE_PROVIDER_SITE_OTHER): Payer: Self-pay | Admitting: Physician Assistant

## 2024-04-06 ENCOUNTER — Telehealth (INDEPENDENT_AMBULATORY_CARE_PROVIDER_SITE_OTHER): Payer: Self-pay | Admitting: Physician Assistant

## 2024-04-06 VITALS — Ht 68.0 in | Wt 179.0 lb

## 2024-04-06 DIAGNOSIS — R5381 Other malaise: Secondary | ICD-10-CM

## 2024-04-06 DIAGNOSIS — E1169 Type 2 diabetes mellitus with other specified complication: Secondary | ICD-10-CM | POA: Diagnosis not present

## 2024-04-06 DIAGNOSIS — Z6827 Body mass index (BMI) 27.0-27.9, adult: Secondary | ICD-10-CM

## 2024-04-06 DIAGNOSIS — Z7985 Long-term (current) use of injectable non-insulin antidiabetic drugs: Secondary | ICD-10-CM

## 2024-04-06 DIAGNOSIS — E669 Obesity, unspecified: Secondary | ICD-10-CM

## 2024-04-06 DIAGNOSIS — E559 Vitamin D deficiency, unspecified: Secondary | ICD-10-CM | POA: Diagnosis not present

## 2024-04-06 DIAGNOSIS — E538 Deficiency of other specified B group vitamins: Secondary | ICD-10-CM | POA: Diagnosis not present

## 2024-04-06 DIAGNOSIS — Z9884 Bariatric surgery status: Secondary | ICD-10-CM

## 2024-04-06 DIAGNOSIS — E119 Type 2 diabetes mellitus without complications: Secondary | ICD-10-CM

## 2024-04-06 DIAGNOSIS — E785 Hyperlipidemia, unspecified: Secondary | ICD-10-CM

## 2024-04-12 ENCOUNTER — Other Ambulatory Visit (INDEPENDENT_AMBULATORY_CARE_PROVIDER_SITE_OTHER): Payer: Self-pay | Admitting: Physician Assistant

## 2024-04-12 DIAGNOSIS — E559 Vitamin D deficiency, unspecified: Secondary | ICD-10-CM

## 2024-04-21 ENCOUNTER — Other Ambulatory Visit (INDEPENDENT_AMBULATORY_CARE_PROVIDER_SITE_OTHER): Payer: Self-pay | Admitting: Physician Assistant

## 2024-04-21 DIAGNOSIS — E119 Type 2 diabetes mellitus without complications: Secondary | ICD-10-CM

## 2024-04-23 ENCOUNTER — Ambulatory Visit: Payer: Self-pay

## 2024-04-25 ENCOUNTER — Ambulatory Visit

## 2024-04-25 DIAGNOSIS — R55 Syncope and collapse: Secondary | ICD-10-CM | POA: Diagnosis not present

## 2024-04-27 LAB — CUP PACEART REMOTE DEVICE CHECK
Date Time Interrogation Session: 20251219231119
Implantable Pulse Generator Implant Date: 20220216

## 2024-04-27 NOTE — Progress Notes (Signed)
 Remote Loop Recorder Transmission

## 2024-04-28 ENCOUNTER — Other Ambulatory Visit (HOSPITAL_COMMUNITY): Payer: Self-pay

## 2024-04-28 ENCOUNTER — Telehealth: Admitting: Physician Assistant

## 2024-04-28 DIAGNOSIS — B9689 Other specified bacterial agents as the cause of diseases classified elsewhere: Secondary | ICD-10-CM | POA: Diagnosis not present

## 2024-04-28 DIAGNOSIS — N760 Acute vaginitis: Secondary | ICD-10-CM

## 2024-04-28 MED ORDER — METRONIDAZOLE 500 MG PO TABS
500.0000 mg | ORAL_TABLET | Freq: Two times a day (BID) | ORAL | 0 refills | Status: AC
  Filled 2024-04-28: qty 14, 7d supply, fill #0

## 2024-04-28 NOTE — Progress Notes (Signed)
 We are sorry that you are not feeling well. Here is how we plan to help! Based on what you shared with me it looks like you: May have a vaginosis due to bacteria  Vaginosis is an inflammation of the vagina that can result in discharge, itching and pain. The cause is usually a change in the normal balance of vaginal bacteria or an infection. Vaginosis can also result from reduced estrogen levels after menopause.  The most common causes of vaginosis are:   Bacterial vaginosis which results from an overgrowth of one on several organisms that are normally present in your vagina.   Yeast infections which are caused by a naturally occurring fungus called candida.   Vaginal atrophy (atrophic vaginosis) which results from the thinning of the vagina from reduced estrogen levels after menopause.   Trichomoniasis which is caused by a parasite and is commonly transmitted by sexual intercourse.  Factors that increase your risk of developing vaginosis include: Medications, such as antibiotics and steroids Uncontrolled diabetes Use of hygiene products such as bubble bath, vaginal spray or vaginal deodorant Douching Wearing damp or tight-fitting clothing Using an intrauterine device (IUD) for birth control Hormonal changes, such as those associated with pregnancy, birth control pills or menopause Sexual activity Having a sexually transmitted infection  Your treatment plan is Metronidazole or Flagyl 500mg  twice a day for 7 days.  I have electronically sent this prescription into the pharmacy that you have chosen.  Be sure to take all of the medication as directed. Stop taking any medication if you develop a rash, tongue swelling or shortness of breath. Mothers who are breast feeding should consider pumping and discarding their breast milk while on these antibiotics. However, there is no consensus that infant exposure at these doses would be harmful.  Remember that medication creams can weaken latex  condoms.   HOME CARE:  Good hygiene may prevent some types of vaginosis from recurring and may relieve some symptoms:  Avoid baths, hot tubs and whirlpool spas. Rinse soap from your outer genital area after a shower, and dry the area well to prevent irritation. Don't use scented or harsh soaps, such as those with deodorant or antibacterial action. Avoid irritants. These include scented tampons and pads. Wipe from front to back after using the toilet. Doing so avoids spreading fecal bacteria to your vagina.  Other things that may help prevent vaginosis include:  Don't douche. Your vagina doesn't require cleansing other than normal bathing. Repetitive douching disrupts the normal organisms that reside in the vagina and can actually increase your risk of vaginal infection. Douching won't clear up a vaginal infection. Use a latex condom. Both female and female latex condoms may help you avoid infections spread by sexual contact. Wear cotton underwear. Also wear pantyhose with a cotton crotch. If you feel comfortable without it, skip wearing underwear to bed. Yeast thrives in [employer] Your symptoms should improve in the next day or two.  GET HELP RIGHT AWAY IF:  You have pain in your lower abdomen ( pelvic area or over your ovaries) You develop nausea or vomiting You develop a fever Your discharge changes or worsens You have persistent pain with intercourse You develop shortness of breath, a rapid pulse, or you faint.  These symptoms could be signs of problems or infections that need to be evaluated by a medical provider now.  MAKE SURE YOU   Understand these instructions. Will watch your condition. Will get help right away if you are not doing  well or get worse.  Your e-visit answers were reviewed by a board certified advanced clinical practitioner to complete your personal care plan. Depending upon the condition, your plan could have included both over the counter or  prescription medications. Please review your pharmacy choice to make sure that you have choses a pharmacy that is open for you to pick up any needed prescription, Your safety is important to us . If you have drug allergies check your prescription carefully.   You can use MyChart to ask questions about today's visit, request a non-urgent call back, or ask for a work or school excuse for 24 hours related to this e-Visit. If it has been greater than 24 hours you will need to follow up with your provider, or enter a new e-Visit to address those concerns. You will get a MyChart message within the next two days asking about your experience. I hope that your e-visit has been valuable and will speed your recovery.  I have spent 5 minutes in review of e-visit questionnaire, review and updating patient chart, medical decision making and response to patient.   Delon CHRISTELLA Dickinson, PA-C

## 2024-05-03 ENCOUNTER — Ambulatory Visit: Payer: Self-pay | Admitting: Internal Medicine

## 2024-05-09 ENCOUNTER — Encounter (INDEPENDENT_AMBULATORY_CARE_PROVIDER_SITE_OTHER): Payer: Self-pay | Admitting: Physician Assistant

## 2024-05-11 ENCOUNTER — Telehealth (INDEPENDENT_AMBULATORY_CARE_PROVIDER_SITE_OTHER): Payer: Self-pay | Admitting: Physician Assistant

## 2024-05-11 ENCOUNTER — Ambulatory Visit (INDEPENDENT_AMBULATORY_CARE_PROVIDER_SITE_OTHER): Admitting: Physician Assistant

## 2024-05-11 DIAGNOSIS — E119 Type 2 diabetes mellitus without complications: Secondary | ICD-10-CM

## 2024-05-11 DIAGNOSIS — E559 Vitamin D deficiency, unspecified: Secondary | ICD-10-CM

## 2024-05-11 NOTE — Telephone Encounter (Signed)
 05/11/2024 Cx pt's appt due to Shawn out of the office due to illness. Patient states that she needs refills of Vitamin D  and Mounjaro . [facility].

## 2024-05-12 ENCOUNTER — Encounter (INDEPENDENT_AMBULATORY_CARE_PROVIDER_SITE_OTHER): Payer: Self-pay | Admitting: Physician Assistant

## 2024-05-12 ENCOUNTER — Other Ambulatory Visit (HOSPITAL_BASED_OUTPATIENT_CLINIC_OR_DEPARTMENT_OTHER): Payer: Self-pay

## 2024-05-12 ENCOUNTER — Other Ambulatory Visit (HOSPITAL_COMMUNITY): Payer: Self-pay

## 2024-05-12 ENCOUNTER — Other Ambulatory Visit: Payer: Self-pay

## 2024-05-12 MED ORDER — TIRZEPATIDE 10 MG/0.5ML ~~LOC~~ SOAJ
10.0000 mg | SUBCUTANEOUS | 0 refills | Status: DC
  Filled 2024-05-12: qty 2, 28d supply, fill #0

## 2024-05-12 MED ORDER — VITAMIN D (ERGOCALCIFEROL) 1.25 MG (50000 UNIT) PO CAPS
50000.0000 [IU] | ORAL_CAPSULE | ORAL | 0 refills | Status: DC
  Filled 2024-05-12: qty 4, 28d supply, fill #0

## 2024-05-13 ENCOUNTER — Telehealth: Payer: Self-pay

## 2024-05-13 NOTE — Telephone Encounter (Signed)
 Copied from CRM [id number]. Topic: Appointments - Transfer of Care >> May 06, 2024  2:22 PM Frederich PARAS wrote: Pt is requesting to transfer FROM: [facility] Pt is requesting to transfer TO: [facility] Reason for requested transfer: hassle getting appointments and they no longer have a female dr  It is the responsibility of the team the patient would like to transfer to (Dr. Geni wallace ) to reach out to the patient if for any reason this transfer is not acceptable.

## 2024-05-13 NOTE — Telephone Encounter (Signed)
 Patient scheduled for an appointment with Dr. Prentiss on 06/08/24

## 2024-05-20 ENCOUNTER — Encounter (INDEPENDENT_AMBULATORY_CARE_PROVIDER_SITE_OTHER): Payer: Self-pay | Admitting: Physician Assistant

## 2024-05-20 ENCOUNTER — Encounter (INDEPENDENT_AMBULATORY_CARE_PROVIDER_SITE_OTHER): Payer: Self-pay

## 2024-05-20 ENCOUNTER — Other Ambulatory Visit (INDEPENDENT_AMBULATORY_CARE_PROVIDER_SITE_OTHER): Payer: Self-pay | Admitting: Physician Assistant

## 2024-05-20 ENCOUNTER — Other Ambulatory Visit (HOSPITAL_COMMUNITY): Payer: Self-pay

## 2024-05-20 ENCOUNTER — Encounter (INDEPENDENT_AMBULATORY_CARE_PROVIDER_SITE_OTHER): Admitting: Physician Assistant

## 2024-05-20 DIAGNOSIS — E669 Obesity, unspecified: Secondary | ICD-10-CM

## 2024-05-20 DIAGNOSIS — E559 Vitamin D deficiency, unspecified: Secondary | ICD-10-CM

## 2024-05-20 DIAGNOSIS — E119 Type 2 diabetes mellitus without complications: Secondary | ICD-10-CM

## 2024-05-20 DIAGNOSIS — E1169 Type 2 diabetes mellitus with other specified complication: Secondary | ICD-10-CM

## 2024-05-20 DIAGNOSIS — E538 Deficiency of other specified B group vitamins: Secondary | ICD-10-CM

## 2024-05-20 DIAGNOSIS — K5909 Other constipation: Secondary | ICD-10-CM

## 2024-05-20 DIAGNOSIS — Z9884 Bariatric surgery status: Secondary | ICD-10-CM

## 2024-05-20 MED ORDER — TIRZEPATIDE 10 MG/0.5ML ~~LOC~~ SOAJ: 10.0000 mg | SUBCUTANEOUS | 0 refills | Status: AC

## 2024-05-20 MED ORDER — CYANOCOBALAMIN 1000 MCG/ML IJ SOLN: 1000.0000 ug | INTRAMUSCULAR | 0 refills | Status: AC

## 2024-05-20 MED ORDER — VITAMIN D (ERGOCALCIFEROL) 1.25 MG (50000 UNIT) PO CAPS: 50000.0000 [IU] | ORAL_CAPSULE | ORAL | 0 refills | Status: AC

## 2024-05-20 MED ORDER — "BD LUER-LOK SYRINGE 25G X 5/8"" 3 ML MISC": 1.0000 | 0 refills | Status: AC

## 2024-05-20 NOTE — Progress Notes (Unsigned)
"   Unable to complete telemedicine visit today due to connectivity issues.  Ordered usual medications for 90 days.  "

## 2024-05-20 NOTE — Progress Notes (Signed)
 Not seen today due to connectivity issues - phone systems down and unable to establish contact with the patient to start visit.  Rx for Mounjaro , B12/syringes and vitamin D  sent to [facility] pharmacy x 90 days.

## 2024-05-20 NOTE — Progress Notes (Deleted)
" °  TeleHealth Visit:  This visit was completed with telemedicine (audio/video) technology. [name] [name] has verbally consented to this TeleHealth visit. The patient is located at home, the provider is located at the [name] office in [place]. The participants in this visit include the listed provider and patient. The visit was conducted today via MyChart video.  SUBJECTIVE: Discussed the use of AI scribe software for clinical note transcription with the patient, who gave verbal consent to proceed.  Chief Complaint: Obesity  Interim History: ***  [name] [name] is here to discuss her progress with her obesity treatment plan. She is on the {HWW Weight Loss Plan:210964005} and states she {CHL AMB IS/IS NOT:210130109} following her eating plan approximately *** % of the time. She states she {CHL AMB IS/IS NOT:210130109} exercising *** minutes *** times per week.   OBJECTIVE: Visit Diagnoses: Problem List Items Addressed This Visit     Generalized obesity   Type 2 diabetes mellitus without complication, without long-term current use of insulin  (HCC) - Primary   Vitamin D  deficiency   B12 deficiency   S/P bariatric surgery   Other Visit Diagnoses       Hyperlipidemia associated with type 2 diabetes mellitus (HCC)         Other constipation           No data recorded No data recorded No data recorded No data recorded   ASSESSMENT AND PLAN:  Diet: [name] [name] {CHL AMB IS/IS NOT:210130109} currently in the action stage of change. As such, her goal is to {HWW Weight Loss Efforts:210964006}. She {HAS HAS WNU:81165} agreed to {HWW Weight Loss Plan:210964005}.  Exercise: [name] [name] has been instructed {HWW Exercise:210964007} for weight loss and overall health benefits.   Behavior Modification:  We discussed the following Behavioral Modification Strategies today: {HWW Behavior Modification:210964008}. We discussed various medication options to help [name] [name] with her weight loss efforts and we both  agreed to ***.  No follow-ups on file.[name] [name] She was informed of the importance of frequent follow up visits to maximize her success with intensive lifestyle modifications for her multiple health conditions.  Attestation Statements:   Reviewed by clinician on day of visit: allergies, medications, problem list, medical history, surgical history, family history, social history, and previous encounter notes.   Time spent on visit including pre-visit chart review and post-visit care and charting was *** minutes.    [name] Kuwahara, PA-C  "

## 2024-05-21 ENCOUNTER — Other Ambulatory Visit (HOSPITAL_COMMUNITY): Payer: Self-pay

## 2024-05-24 ENCOUNTER — Ambulatory Visit: Payer: Self-pay

## 2024-05-25 ENCOUNTER — Ambulatory Visit: Payer: Self-pay

## 2024-05-26 ENCOUNTER — Ambulatory Visit

## 2024-05-26 ENCOUNTER — Other Ambulatory Visit (HOSPITAL_BASED_OUTPATIENT_CLINIC_OR_DEPARTMENT_OTHER): Payer: Self-pay

## 2024-05-26 DIAGNOSIS — R55 Syncope and collapse: Secondary | ICD-10-CM | POA: Diagnosis not present

## 2024-05-27 LAB — CUP PACEART REMOTE DEVICE CHECK
Date Time Interrogation Session: 20260119230641
Implantable Pulse Generator Implant Date: 20220216

## 2024-05-28 ENCOUNTER — Ambulatory Visit: Payer: Self-pay | Admitting: Cardiology

## 2024-05-29 NOTE — Progress Notes (Signed)
 Remote Loop Recorder Transmission

## 2024-06-01 ENCOUNTER — Other Ambulatory Visit: Payer: Self-pay | Admitting: Medical Genetics

## 2024-06-08 ENCOUNTER — Encounter: Admitting: Family Medicine

## 2024-06-22 ENCOUNTER — Other Ambulatory Visit

## 2024-06-24 ENCOUNTER — Ambulatory Visit: Payer: Self-pay

## 2024-06-25 ENCOUNTER — Ambulatory Visit: Payer: Self-pay

## 2024-06-26 ENCOUNTER — Ambulatory Visit

## 2024-06-29 ENCOUNTER — Ambulatory Visit (INDEPENDENT_AMBULATORY_CARE_PROVIDER_SITE_OTHER): Admitting: Physician Assistant

## 2024-07-20 ENCOUNTER — Encounter: Admitting: Family Medicine

## 2024-07-25 ENCOUNTER — Ambulatory Visit: Payer: Self-pay

## 2024-07-27 ENCOUNTER — Ambulatory Visit

## 2024-07-27 ENCOUNTER — Ambulatory Visit: Payer: Self-pay

## 2024-08-25 ENCOUNTER — Ambulatory Visit: Payer: Self-pay

## 2024-08-27 ENCOUNTER — Ambulatory Visit

## 2024-09-25 ENCOUNTER — Ambulatory Visit: Payer: Self-pay

## 2024-09-27 ENCOUNTER — Ambulatory Visit

## 2024-10-28 ENCOUNTER — Ambulatory Visit

## 2024-11-28 ENCOUNTER — Ambulatory Visit

## 2024-12-29 ENCOUNTER — Ambulatory Visit

## 2025-01-29 ENCOUNTER — Ambulatory Visit

## 2025-03-01 ENCOUNTER — Ambulatory Visit

## 2025-04-01 ENCOUNTER — Ambulatory Visit

## 2025-05-02 ENCOUNTER — Ambulatory Visit

## 2025-06-02 ENCOUNTER — Ambulatory Visit
